# Patient Record
Sex: Female | Born: 1940 | Race: Black or African American | Hispanic: No | Smoking: Former smoker
Health system: Southern US, Community
[De-identification: ages and names within clinical notes are randomized; demographics above are authoritative.]

## PROBLEM LIST (undated history)

## (undated) DIAGNOSIS — I639 Cerebral infarction, unspecified: Secondary | ICD-10-CM

## (undated) DIAGNOSIS — M109 Gout, unspecified: Secondary | ICD-10-CM

## (undated) DIAGNOSIS — I10 Essential (primary) hypertension: Secondary | ICD-10-CM

## (undated) DIAGNOSIS — R011 Cardiac murmur, unspecified: Secondary | ICD-10-CM

## (undated) DIAGNOSIS — M1990 Unspecified osteoarthritis, unspecified site: Secondary | ICD-10-CM

## (undated) DIAGNOSIS — Z95810 Presence of automatic (implantable) cardiac defibrillator: Secondary | ICD-10-CM

## (undated) DIAGNOSIS — C50912 Malignant neoplasm of unspecified site of left female breast: Secondary | ICD-10-CM

## (undated) DIAGNOSIS — F32A Depression, unspecified: Secondary | ICD-10-CM

## (undated) DIAGNOSIS — Z8719 Personal history of other diseases of the digestive system: Secondary | ICD-10-CM

## (undated) DIAGNOSIS — E669 Obesity, unspecified: Secondary | ICD-10-CM

## (undated) DIAGNOSIS — I77819 Aortic ectasia, unspecified site: Secondary | ICD-10-CM

## (undated) DIAGNOSIS — I509 Heart failure, unspecified: Secondary | ICD-10-CM

## (undated) DIAGNOSIS — Z8711 Personal history of peptic ulcer disease: Secondary | ICD-10-CM

## (undated) DIAGNOSIS — N183 Chronic kidney disease, stage 3 unspecified: Secondary | ICD-10-CM

## (undated) DIAGNOSIS — G4733 Obstructive sleep apnea (adult) (pediatric): Secondary | ICD-10-CM

## (undated) DIAGNOSIS — F329 Major depressive disorder, single episode, unspecified: Secondary | ICD-10-CM

## (undated) DIAGNOSIS — I429 Cardiomyopathy, unspecified: Secondary | ICD-10-CM

## (undated) DIAGNOSIS — Z9989 Dependence on other enabling machines and devices: Secondary | ICD-10-CM

## (undated) DIAGNOSIS — K219 Gastro-esophageal reflux disease without esophagitis: Secondary | ICD-10-CM

## (undated) HISTORY — DX: Heart failure, unspecified: I50.9

## (undated) HISTORY — PX: CATARACT EXTRACTION, BILATERAL: SHX1313

## (undated) HISTORY — PX: ABDOMINAL HYSTERECTOMY: SHX81

## (undated) HISTORY — PX: DILATION AND CURETTAGE OF UTERUS: SHX78

## (undated) HISTORY — DX: Cerebral infarction, unspecified: I63.9

## (undated) HISTORY — DX: Obesity, unspecified: E66.9

---

## 2006-02-08 HISTORY — PX: BREAST BIOPSY: SHX20

## 2007-02-09 DIAGNOSIS — C50912 Malignant neoplasm of unspecified site of left female breast: Secondary | ICD-10-CM

## 2007-02-09 HISTORY — PX: MASTECTOMY: SHX3

## 2007-02-09 HISTORY — DX: Malignant neoplasm of unspecified site of left female breast: C50.912

## 2013-11-20 ENCOUNTER — Ambulatory Visit
Admission: RE | Admit: 2013-11-20 | Discharge: 2013-11-20 | Disposition: A | Discharge status: Home / Self Care | Payer: Commercial Managed Care - HMO | Source: Ambulatory Visit | Attending: Family Medicine | Admitting: Family Medicine

## 2013-11-20 ENCOUNTER — Other Ambulatory Visit: Payer: Self-pay | Admitting: Family Medicine

## 2013-11-20 DIAGNOSIS — M79675 Pain in left toe(s): Secondary | ICD-10-CM

## 2014-05-01 ENCOUNTER — Encounter (HOSPITAL_COMMUNITY): Payer: Self-pay | Admitting: Emergency Medicine

## 2014-05-01 ENCOUNTER — Emergency Department (HOSPITAL_COMMUNITY): Payer: Commercial Managed Care - HMO

## 2014-05-01 ENCOUNTER — Other Ambulatory Visit (HOSPITAL_COMMUNITY): Payer: Self-pay

## 2014-05-01 ENCOUNTER — Inpatient Hospital Stay (HOSPITAL_COMMUNITY)
Admission: EM | Admit: 2014-05-01 | Discharge: 2014-05-08 | DRG: 287 | Disposition: A | Discharge status: Home / Self Care | Payer: Commercial Managed Care - HMO | Attending: Internal Medicine | Admitting: Internal Medicine

## 2014-05-01 DIAGNOSIS — I161 Hypertensive emergency: Secondary | ICD-10-CM

## 2014-05-01 DIAGNOSIS — N179 Acute kidney failure, unspecified: Secondary | ICD-10-CM | POA: Diagnosis present

## 2014-05-01 DIAGNOSIS — N183 Chronic kidney disease, stage 3 unspecified: Secondary | ICD-10-CM | POA: Diagnosis present

## 2014-05-01 DIAGNOSIS — I2584 Coronary atherosclerosis due to calcified coronary lesion: Secondary | ICD-10-CM | POA: Diagnosis present

## 2014-05-01 DIAGNOSIS — Z91013 Allergy to seafood: Secondary | ICD-10-CM | POA: Diagnosis not present

## 2014-05-01 DIAGNOSIS — Z87891 Personal history of nicotine dependence: Secondary | ICD-10-CM | POA: Diagnosis not present

## 2014-05-01 DIAGNOSIS — I5043 Acute on chronic combined systolic (congestive) and diastolic (congestive) heart failure: Secondary | ICD-10-CM | POA: Diagnosis present

## 2014-05-01 DIAGNOSIS — K5900 Constipation, unspecified: Secondary | ICD-10-CM | POA: Diagnosis not present

## 2014-05-01 DIAGNOSIS — I160 Hypertensive urgency: Secondary | ICD-10-CM | POA: Insufficient documentation

## 2014-05-01 DIAGNOSIS — J90 Pleural effusion, not elsewhere classified: Secondary | ICD-10-CM | POA: Diagnosis present

## 2014-05-01 DIAGNOSIS — I129 Hypertensive chronic kidney disease with stage 1 through stage 4 chronic kidney disease, or unspecified chronic kidney disease: Secondary | ICD-10-CM | POA: Diagnosis present

## 2014-05-01 DIAGNOSIS — I429 Cardiomyopathy, unspecified: Secondary | ICD-10-CM | POA: Diagnosis present

## 2014-05-01 DIAGNOSIS — I2510 Atherosclerotic heart disease of native coronary artery without angina pectoris: Secondary | ICD-10-CM | POA: Diagnosis present

## 2014-05-01 DIAGNOSIS — Z853 Personal history of malignant neoplasm of breast: Secondary | ICD-10-CM | POA: Diagnosis not present

## 2014-05-01 DIAGNOSIS — J9811 Atelectasis: Secondary | ICD-10-CM | POA: Diagnosis present

## 2014-05-01 DIAGNOSIS — Z7982 Long term (current) use of aspirin: Secondary | ICD-10-CM | POA: Diagnosis not present

## 2014-05-01 DIAGNOSIS — Z8249 Family history of ischemic heart disease and other diseases of the circulatory system: Secondary | ICD-10-CM | POA: Diagnosis not present

## 2014-05-01 DIAGNOSIS — M109 Gout, unspecified: Secondary | ICD-10-CM | POA: Diagnosis present

## 2014-05-01 DIAGNOSIS — Z9012 Acquired absence of left breast and nipple: Secondary | ICD-10-CM | POA: Diagnosis present

## 2014-05-01 DIAGNOSIS — I447 Left bundle-branch block, unspecified: Secondary | ICD-10-CM | POA: Diagnosis present

## 2014-05-01 DIAGNOSIS — J810 Acute pulmonary edema: Secondary | ICD-10-CM | POA: Diagnosis not present

## 2014-05-01 DIAGNOSIS — K219 Gastro-esophageal reflux disease without esophagitis: Secondary | ICD-10-CM | POA: Diagnosis present

## 2014-05-01 DIAGNOSIS — I5021 Acute systolic (congestive) heart failure: Secondary | ICD-10-CM | POA: Diagnosis not present

## 2014-05-01 DIAGNOSIS — N182 Chronic kidney disease, stage 2 (mild): Secondary | ICD-10-CM | POA: Diagnosis not present

## 2014-05-01 DIAGNOSIS — I7789 Other specified disorders of arteries and arterioles: Secondary | ICD-10-CM | POA: Diagnosis not present

## 2014-05-01 DIAGNOSIS — Z9221 Personal history of antineoplastic chemotherapy: Secondary | ICD-10-CM

## 2014-05-01 DIAGNOSIS — Z791 Long term (current) use of non-steroidal anti-inflammatories (NSAID): Secondary | ICD-10-CM

## 2014-05-01 DIAGNOSIS — I509 Heart failure, unspecified: Secondary | ICD-10-CM | POA: Insufficient documentation

## 2014-05-01 DIAGNOSIS — Z8673 Personal history of transient ischemic attack (TIA), and cerebral infarction without residual deficits: Secondary | ICD-10-CM

## 2014-05-01 DIAGNOSIS — I10 Essential (primary) hypertension: Secondary | ICD-10-CM | POA: Diagnosis not present

## 2014-05-01 DIAGNOSIS — I5042 Chronic combined systolic (congestive) and diastolic (congestive) heart failure: Secondary | ICD-10-CM | POA: Diagnosis present

## 2014-05-01 HISTORY — DX: Gout, unspecified: M10.9

## 2014-05-01 HISTORY — DX: Essential (primary) hypertension: I10

## 2014-05-01 HISTORY — DX: Chronic kidney disease, stage 3 (moderate): N18.3

## 2014-05-01 HISTORY — DX: Chronic kidney disease, stage 3 unspecified: N18.30

## 2014-05-01 HISTORY — DX: Aortic ectasia, unspecified site: I77.819

## 2014-05-01 HISTORY — DX: Cardiomyopathy, unspecified: I42.9

## 2014-05-01 LAB — CBC
HEMATOCRIT: 41.7 % (ref 36.0–46.0)
HEMATOCRIT: 44.5 % (ref 36.0–46.0)
HEMOGLOBIN: 15.1 g/dL — AB (ref 12.0–15.0)
Hemoglobin: 14 g/dL (ref 12.0–15.0)
MCH: 28.1 pg (ref 26.0–34.0)
MCH: 28.1 pg (ref 26.0–34.0)
MCHC: 33.6 g/dL (ref 30.0–36.0)
MCHC: 33.9 g/dL (ref 30.0–36.0)
MCV: 83.7 fL (ref 78.0–100.0)
MCV: 82.7 fL (ref 78.0–100.0)
PLATELETS: 201 10*3/uL (ref 150–400)
Platelets: 195 10*3/uL (ref 150–400)
RBC: 4.98 MIL/uL (ref 3.87–5.11)
RBC: 5.38 MIL/uL — AB (ref 3.87–5.11)
RDW: 15.3 % (ref 11.5–15.5)
RDW: 15.2 % (ref 11.5–15.5)
WBC: 4.5 10*3/uL (ref 4.0–10.5)
WBC: 5 10*3/uL (ref 4.0–10.5)

## 2014-05-01 LAB — BASIC METABOLIC PANEL
ANION GAP: 7 (ref 5–15)
BUN: 22 mg/dL (ref 6–23)
CHLORIDE: 108 mmol/L (ref 96–112)
CO2: 27 mmol/L (ref 19–32)
Calcium: 9.7 mg/dL (ref 8.4–10.5)
Creatinine, Ser: 1.34 mg/dL — ABNORMAL HIGH (ref 0.50–1.10)
GFR calc non Af Amer: 38 mL/min — ABNORMAL LOW (ref >90)
GFR, EST AFRICAN AMERICAN: 44 mL/min — AB (ref >90)
Glucose, Bld: 97 mg/dL (ref 70–99)
POTASSIUM: 4 mmol/L (ref 3.5–5.1)
Sodium: 142 mmol/L (ref 135–145)

## 2014-05-01 LAB — I-STAT TROPONIN, ED: TROPONIN I, POC: 0.03 ng/mL (ref 0.00–0.08)

## 2014-05-01 LAB — CREATININE, SERUM
CREATININE: 1.26 mg/dL — AB (ref 0.50–1.10)
GFR, EST AFRICAN AMERICAN: 47 mL/min — AB (ref >90)
GFR, EST NON AFRICAN AMERICAN: 41 mL/min — AB (ref >90)

## 2014-05-01 LAB — TROPONIN I: TROPONIN I: 0.03 ng/mL (ref <0.031)

## 2014-05-01 LAB — BRAIN NATRIURETIC PEPTIDE: B Natriuretic Peptide: 433.8 pg/mL — ABNORMAL HIGH (ref 0.0–100.0)

## 2014-05-01 LAB — D-DIMER, QUANTITATIVE: D-Dimer, Quant: 0.89 ug/mL-FEU — ABNORMAL HIGH (ref 0.00–0.48)

## 2014-05-01 MED ORDER — IOHEXOL 350 MG/ML SOLN
100.0000 mL | Freq: Once | INTRAVENOUS | Status: AC | PRN
  Administered 2014-05-01: 100 mL via INTRAVENOUS

## 2014-05-01 MED ORDER — ASPIRIN 81 MG PO CHEW: 324.0000 mg | CHEWABLE_TABLET | ORAL | Status: DC

## 2014-05-01 MED ORDER — ASPIRIN 325 MG PO TABS
325.0000 mg | ORAL_TABLET | Freq: Once | ORAL | Status: AC
  Administered 2014-05-01: 325 mg via ORAL
  Filled 2014-05-01: qty 1

## 2014-05-01 MED ORDER — ALLOPURINOL 100 MG PO TABS
100.0000 mg | ORAL_TABLET | Freq: Every day | ORAL | Status: DC
  Administered 2014-05-02 – 2014-05-08 (×8): 100 mg via ORAL
  Filled 2014-05-01 (×8): qty 1

## 2014-05-01 MED ORDER — HYDRALAZINE HCL 20 MG/ML IJ SOLN: 10.0000 mg | INTRAMUSCULAR | Status: DC | PRN

## 2014-05-01 MED ORDER — HEPARIN SODIUM (PORCINE) 5000 UNIT/ML IJ SOLN
5000.0000 [IU] | Freq: Three times a day (TID) | INTRAMUSCULAR | Status: DC
  Administered 2014-05-01 – 2014-05-06 (×15): 5000 [IU] via SUBCUTANEOUS
  Filled 2014-05-01 (×21): qty 1

## 2014-05-01 MED ORDER — HYDRALAZINE HCL 20 MG/ML IJ SOLN
10.0000 mg | Freq: Once | INTRAMUSCULAR | Status: AC
  Administered 2014-05-01: 10 mg via INTRAVENOUS
  Filled 2014-05-01: qty 1

## 2014-05-01 MED ORDER — NITROGLYCERIN IN D5W 200-5 MCG/ML-% IV SOLN
10.0000 ug/min | INTRAVENOUS | Status: DC
  Administered 2014-05-01: 10 ug/min via INTRAVENOUS
  Filled 2014-05-01: qty 250

## 2014-05-01 MED ORDER — NITROGLYCERIN 2 % TD OINT: 1.0000 [in_us] | TOPICAL_OINTMENT | Freq: Four times a day (QID) | TRANSDERMAL | Status: DC | PRN

## 2014-05-01 MED ORDER — NITROGLYCERIN 2 % TD OINT
1.0000 [in_us] | TOPICAL_OINTMENT | Freq: Once | TRANSDERMAL | Status: AC
  Administered 2014-05-01: 1 [in_us] via TOPICAL
  Filled 2014-05-01: qty 1

## 2014-05-01 MED ORDER — ASPIRIN 300 MG RE SUPP: 300.0000 mg | RECTAL | Status: DC

## 2014-05-01 MED ORDER — FUROSEMIDE 10 MG/ML IJ SOLN
40.0000 mg | Freq: Once | INTRAMUSCULAR | Status: AC
  Administered 2014-05-01: 40 mg via INTRAVENOUS
  Filled 2014-05-01: qty 4

## 2014-05-01 MED ORDER — PANTOPRAZOLE SODIUM 40 MG PO TBEC
40.0000 mg | DELAYED_RELEASE_TABLET | Freq: Every day | ORAL | Status: DC
  Administered 2014-05-01 – 2014-05-08 (×8): 40 mg via ORAL
  Filled 2014-05-01 (×5): qty 1

## 2014-05-01 MED ORDER — SODIUM CHLORIDE 0.9 % IV SOLN: 250.0000 mL | INTRAVENOUS | Status: DC | PRN

## 2014-05-01 MED ORDER — NICARDIPINE HCL IN NACL 20-0.86 MG/200ML-% IV SOLN
3.0000 mg/h | INTRAVENOUS | Status: DC
  Administered 2014-05-01: 5 mg/h via INTRAVENOUS
  Administered 2014-05-02: 2.5 mg/h via INTRAVENOUS
  Filled 2014-05-01 (×3): qty 200

## 2014-05-01 NOTE — ED Notes (Addendum)
Dr. Johnney Killian notified of pt. bp decrease to 191/119. Will increase Nitro drip to 10 mcg when pt. Returns from CTA. Pt. Alert and oriented without CP. Dr. Johnney Killian gave verbal order that it is ok to d/c nitro when pt. Is in CT.

## 2014-05-01 NOTE — ED Provider Notes (Signed)
CSN: [id number]     Arrival date & time 05/01/14  1358 History   First MD Initiated Contact with Patient 05/01/14 1521     Chief Complaint  Patient presents with  . Shortness of Breath     (Consider location/radiation/quality/duration/timing/severity/associated sxs/prior Treatment) HPI The patient reports shortness of breath intermittently for the past 2 months. It has gotten significantly worse in the past 2 days. She reports yesterday she was very short of breath especially in the morning when she woke up. She got some improvement over the course of the day but by today she again was feeling very short of breath. She does identify some swelling in her legs. She reports that it's been both legs and she denies any calf pain. She denies any chest pain, fever, cough or sputum production. There has been no vomiting or diarrhea. She reports sometimes she has difficulty making it to the bathroom before she has to go, however she denies decreased or lack of urine output. She denies similar symptoms until the rash will onset about 2 months ago. She has a distant smoking history. The patient quit 7 years ago and prior to that smoked approximately one pack every 3 days. The patient reports she did not take her blood pressure medicines today because she wasn't feeling well. She reports that she has been compliant with the medications up until yesterday. The patient was seen by her primary care doctor today. Dr. Ernie Hew on [address] She was referred to the emergency department for further evaluation and treatment. Past Medical History  Diagnosis Date  . Hypertension   . Cancer     breast cancer   Past Surgical History  Procedure Laterality Date  . Mastectomy    . Abdominal hysterectomy     No family history on file. History  Substance Use Topics  . Smoking status: Former Research scientist (life sciences)  . Smokeless tobacco: Not on file  . Alcohol Use: No   OB History    No data available     Review of Systems  10  Systems reviewed and are negative for acute change except as noted in the HPI.  Allergies  Shellfish allergy  Home Medications   Prior to Admission medications   Medication Sig Start Date End Date Taking? Authorizing Provider  allopurinol (ZYLOPRIM) 100 MG tablet Take 100 mg by mouth daily. 03/28/14  Yes Historical Provider, MD  BYSTOLIC 2.5 MG tablet Take 2.5 mg by mouth daily.  03/29/14  Yes Historical Provider, MD  ibuprofen (ADVIL,MOTRIN) 200 MG tablet Take 400 mg by mouth every 6 (six) hours as needed for moderate pain.   Yes Historical Provider, MD  omeprazole (PRILOSEC) 40 MG capsule Take 40 mg by mouth daily. 03/28/14  Yes Historical Provider, MD   BP 192/98 mmHg  Pulse 61  Temp(Src) 97.9 F (36.6 C) (Oral)  Resp 15  SpO2 98% Physical Exam  Constitutional: She is oriented to person, place, and time.  Patient is moderately obese. She has mildly dyspneic at rest. The patient is nontoxic and alert. Her mental status is clear. Skin is warm and dry.  HENT:  Head: Normocephalic and atraumatic.  Eyes: EOM are normal. Pupils are equal, round, and reactive to light.  Neck: Neck supple.  Cardiovascular: Normal rate, regular rhythm, normal heart sounds and intact distal pulses.   Pulmonary/Chest: Effort normal and breath sounds normal.  Mild increased work of breathing at rest.  Abdominal: Soft. Bowel sounds are normal. She exhibits no distension. There is no  tenderness.  Musculoskeletal: Normal range of motion. She exhibits edema.  Patient has symmetric 1+ pitting edema bilateral lower extremities. No calf or popliteal fossa tenderness.  Neurological: She is alert and oriented to person, place, and time. She has normal strength. Coordination normal. GCS eye subscore is 4. GCS verbal subscore is 5. GCS motor subscore is 6.  Skin: Skin is warm, dry and intact.  Psychiatric: She has a normal mood and affect.    ED Course  Procedures (including critical care time) Labs Review Labs  Reviewed  BASIC METABOLIC PANEL - Abnormal; Notable for the following:    Creatinine, Ser 1.34 (*)    GFR calc non Af Amer 38 (*)    GFR calc Af Amer 44 (*)    All other components within normal limits  BRAIN NATRIURETIC PEPTIDE - Abnormal; Notable for the following:    B Natriuretic Peptide 433.8 (*)    All other components within normal limits  D-DIMER, QUANTITATIVE - Abnormal; Notable for the following:    D-Dimer, Quant 0.89 (*)    All other components within normal limits  CBC  I-STAT TROPOININ, ED    Imaging Review Dg Chest 2 View  05/01/2014   CLINICAL DATA:  Shortness of breath beginning yesterday, former smoker, LEFT breast cancer post mastectomy in 2009, hypertension  EXAM: CHEST  2 VIEW  COMPARISON:  None  FINDINGS: Enlargement of cardiac silhouette.  Mild elongation of thoracic aorta with atherosclerotic calcification.  Bronchitic changes with subsegmental atelectasis at RIGHT base.  No definite acute infiltrate, pleural effusion or pneumothorax.  Bones demineralized.  IMPRESSION: Enlargement of cardiac silhouette.  Bronchitic changes with subsegmental atelectasis RIGHT base.   Electronically Signed   By: Lavonia Dana M.D.   On: 05/01/2014 15:33   Ct Angio Chest Pe W/cm &/or Wo Cm  05/01/2014   CLINICAL DATA:  Short of breath.  Cough.  Fever.  Chills.  EXAM: CT ANGIOGRAPHY CHEST WITH CONTRAST  TECHNIQUE: Multidetector CT imaging of the chest was performed using the standard protocol during bolus administration of intravenous contrast. Multiplanar CT image reconstructions and MIPs were obtained to evaluate the vascular anatomy.  CONTRAST:  100 mL Omnipaque 300.  COMPARISON:  Chest radiograph 05/01/2014.  FINDINGS: Bones: No aggressive osseous lesions. Age expected thoracic and lower cervical spondylosis.  Cardiovascular: Technically adequate study. No pulmonary embolism. No gross acute aortic abnormality.  Distal aortic arch measures 3.9 cm. Recommend annual imaging followup by CTA or  MRA. This recommendation follows 2010 ACCF/AHA/AATS/ACR/ASA/SCA/SCAI/SIR/STS/SVM Guidelines for the Diagnosis and Management of Patients with Thoracic Aortic Disease. Circulation.2010; 121: P809-X833  Lungs: Dependent atelectasis is present. There is airspace disease in the dependent portions of both upper lobes and both lower lobes. This is superimposed on the atelectasis. Interlobular septal thickening is present at the apices. No focal airspace consolidation to suggest pneumonia or aspiration.  Central airways: Patent.  Effusions: Small pleural effusions bilaterally. No pericardial effusion.  Lymphadenopathy: LEFT mastectomy and axillary dissection with scarring in the LEFT axilla. No RIGHT axillary adenopathy. No mediastinal or hilar adenopathy. Calcified RIGHT hilar lymph nodes consistent with old granulomatous disease.  Esophagus: Normal.  Upper abdomen: Atherosclerosis.  Other: None.  Review of the MIP images confirms the above findings.  IMPRESSION: 1. Negative for pulmonary embolism. 2. Constellation of findings compatible with mild to moderate CHF. Small bilateral pleural effusions and mild alveolar pulmonary edema along with interstitial edema. 3. 39 mm distal aortic arch. Recommend annual imaging followup by CTA or MRA. This recommendation  follows 2010 ACCF/AHA/AATS/ACR/ASA/SCA/SCAI/SIR/STS/SVM Guidelines for the Diagnosis and Management of Patients with Thoracic Aortic Disease. Circulation.2010; 121: H686-H683   Electronically Signed   By: Dereck Ligas M.D.   On: 05/01/2014 20:00     EKG Interpretation   Date/Time:  Wednesday May 01 2014 14:04:50 EDT Ventricular Rate:  72 PR Interval:  192 QRS Duration: 140 QT Interval:  444 QTC Calculation: 486 R Axis:   -37 Text Interpretation:  Sinus rhythm with occasional Premature ventricular  complexes and Possible Premature atrial complexes with Abberant conduction  Left axis deviation Left ventricular hypertrophy with QRS widening and   repolarization abnormality Cannot rule out Septal infarct , age  undetermined Abnormal ECG AGREE. NEED REPEAT FOR BASELINE ARTIFACT.  Confirmed by Johnney Killian, MD, Jeannie Done [phone]) on 05/01/2014 3:42:28 PM     Recheck 18:00. Patient reports her shortness of breath feels improved. Blood pressures however has not improved with Lasix and nitroglycerin paste. Measured pressure is 198/113. I will initiate the patient on a nitroglycerin drip. She has not yet been to CT. Recheck 20:30 patient has returned from CT and has no complaints of chest pain. She is alert and appropriate. No respiratory distress. Consultation with Dr. Titus Mould (intensivist) he suggests administration of hydralazine 10 mg in continuation of nitroglycerin drip. The patient will be admitted to the ICU for drip maintenance. MDM   Final diagnoses:  Hypertensive emergency  Acute congestive heart failure, unspecified congestive heart failure type   The patient presents on above with dyspnea. Findings are consistent with congestive heart failure and hypertensive emergency. The patient's mental status is clear with no headache and no neurologic complaints. She does not endorse any chest pain in association with her dyspnea. She does however have significantly elevated blood pressure with congestive heart failure.    Charlesetta Shanks, MD 05/01/14 2115

## 2014-05-01 NOTE — H&P (Signed)
PULMONARY / CRITICAL CARE MEDICINE   Name: Tara Dunn MRN: [id number] DOB: Jul 20, 1940    ADMISSION DATE:  05/01/2014 CONSULTATION DATE:  05/01/2014  REFERRING MD :  EDP  CHIEF COMPLAINT:  SOB  INITIAL PRESENTATION:  74 y.o. F brought to [facility] ED 3/23 with SOB due to hypertensive urgency.  Started on nitro gtt and PCCM called for ICU admission.     STUDIES:  CXR 3/23 >>>  Enlargement of cardiac silhouette, atx right base. CTA Chest 3/23 >>> neg for PE.  Mild to mod CHF.  Small b/l pleural effusions and mild pulm and interstitial edema.  SIGNIFICANT EVENTS: 3/23 - admit   HISTORY OF PRESENT ILLNESS:  Tara Dunn is a 74 y.o. F with PMH of HTN and breast CA who presented to [facility] ED 3/23 with SOB.  She states her symptoms have been present for past 2 months, but have worsened over the past 2 - 3 days.  1 day ago, symptoms were at their worst; however, she did not feel like seeking medical attention despite her children urging her to do so.  On 3/23, symptoms persisted so she agreed to go to PCP where she was found to be hypertensive.  PCP sent her to ED for further evaluation.  In ED, BP noted to be 205 / 145.   She takes antihypertensives at home and states that she did not take them on 3/22 but otherwise has not missed any doses.  Denies any headaches, visual disturbances, chest pain, N/V/D, abd pain, myalgias, calf pain.  No recent periods of prolonged immobilization or travel.  No prior hx of clots.  Does have hx of breast CA. She was started on nitroglycerin infusion and was also given lasix and hydralazine.  PCCM was consulted for admission to ICU due to need for antihypertensive infusions.  PAST MEDICAL HISTORY :   has a past medical history of Hypertension and Cancer.  has past surgical history that includes Mastectomy and Abdominal hysterectomy. Prior to Admission medications   Medication Sig Start Date End Date Taking? Authorizing Provider  allopurinol (ZYLOPRIM) 100 MG  tablet Take 100 mg by mouth daily. 03/28/14  Yes Historical Provider, MD  BYSTOLIC 2.5 MG tablet Take 2.5 mg by mouth daily.  03/29/14  Yes Historical Provider, MD  ibuprofen (ADVIL,MOTRIN) 200 MG tablet Take 400 mg by mouth every 6 (six) hours as needed for moderate pain.   Yes Historical Provider, MD  omeprazole (PRILOSEC) 40 MG capsule Take 40 mg by mouth daily. 03/28/14  Yes Historical Provider, MD   Allergies  Allergen Reactions  . Shellfish Allergy Hives    FAMILY HISTORY:  No family history on file.  SOCIAL HISTORY:  reports that she has quit smoking. She does not have any smokeless tobacco history on file. She reports that she does not drink alcohol or use illicit drugs.  REVIEW OF SYSTEMS:   All negative; except for those that are bolded, which indicate positives.  Constitutional: weight loss, weight gain, night sweats, fevers, chills, fatigue, weakness.  HEENT: headaches, sore throat, sneezing, nasal congestion, post nasal drip, difficulty swallowing, tooth/dental problems, visual complaints, visual changes, ear aches. Neuro: difficulty with speech, weakness, numbness, ataxia. CV:  chest pain, orthopnea, PND, swelling in lower extremities, dizziness, palpitations, syncope.  Resp: cough, hemoptysis, dyspnea, wheezing. GI  heartburn, indigestion, abdominal pain, nausea, vomiting, diarrhea, constipation, change in bowel habits, loss of appetite, hematemesis, melena, hematochezia.  GU: dysuria, change in color of urine, urgency or frequency, flank pain, hematuria.  MSK: joint pain or swelling, decreased range of motion. Psych: change in mood or affect, depression, anxiety, suicidal ideations, homicidal ideations. Skin: rash, itching, bruising.   SUBJECTIVE:  Dyspnea improved since arriving to ED.  Denies any chest pain, blurry vision, headaches.  VITAL SIGNS: Temp:  [97.9 F (36.6 C)] 97.9 F (36.6 C) (03/23 1415) Pulse Rate:  [54-76] 56 (03/23 2115) Resp:  [14-24] 18 (03/23  2115) BP: (165-205)/(97-145) 189/117 mmHg (03/23 2115) SpO2:  [91 %-99 %] 98 % (03/23 2115) HEMODYNAMICS:   VENTILATOR SETTINGS:   INTAKE / OUTPUT: Intake/Output      03/23 0701 - 03/24 0700   Urine 500   Total Output 500   Net -500         PHYSICAL EXAMINATION: General: WDWN female, in NAD. Neuro: A&O x 3, non-focal.  HEENT: [place]/AT. PERRL, sclerae anicteric. Cardiovascular: RRR, no M/R/G.  Lungs: Respirations even and unlabored.  Faint crackles, diminished in bases. Abdomen: BS x 4, soft, NT/ND.  Musculoskeletal: No gross deformities, no edema.  Skin: Intact, warm, no rashes.  LABS:  CBC  Recent Labs Lab 05/01/14 1424  WBC 4.5  HGB 14.0  HCT 41.7  PLT 201   Coag's No results for input(s): APTT, INR in the last 168 hours. BMET  Recent Labs Lab 05/01/14 1424  NA 142  K 4.0  CL 108  CO2 27  BUN 22  CREATININE 1.34*  GLUCOSE 97   Electrolytes  Recent Labs Lab 05/01/14 1424  CALCIUM 9.7   Sepsis Markers No results for input(s): LATICACIDVEN, PROCALCITON, O2SATVEN in the last 168 hours. ABG No results for input(s): PHART, PCO2ART, PO2ART in the last 168 hours. Liver Enzymes No results for input(s): AST, ALT, ALKPHOS, BILITOT, ALBUMIN in the last 168 hours. Cardiac Enzymes No results for input(s): TROPONINI, PROBNP in the last 168 hours. Glucose No results for input(s): GLUCAP in the last 168 hours.  Imaging Dg Chest 2 View  05/01/2014   CLINICAL DATA:  Shortness of breath beginning yesterday, former smoker, LEFT breast cancer post mastectomy in 2009, hypertension  EXAM: CHEST  2 VIEW  COMPARISON:  None  FINDINGS: Enlargement of cardiac silhouette.  Mild elongation of thoracic aorta with atherosclerotic calcification.  Bronchitic changes with subsegmental atelectasis at RIGHT base.  No definite acute infiltrate, pleural effusion or pneumothorax.  Bones demineralized.  IMPRESSION: Enlargement of cardiac silhouette.  Bronchitic changes with subsegmental  atelectasis RIGHT base.   Electronically Signed   By: Lavonia Dana M.D.   On: 05/01/2014 15:33   Ct Angio Chest Pe W/cm &/or Wo Cm  05/01/2014   CLINICAL DATA:  Short of breath.  Cough.  Fever.  Chills.  EXAM: CT ANGIOGRAPHY CHEST WITH CONTRAST  TECHNIQUE: Multidetector CT imaging of the chest was performed using the standard protocol during bolus administration of intravenous contrast. Multiplanar CT image reconstructions and MIPs were obtained to evaluate the vascular anatomy.  CONTRAST:  100 mL Omnipaque 300.  COMPARISON:  Chest radiograph 05/01/2014.  FINDINGS: Bones: No aggressive osseous lesions. Age expected thoracic and lower cervical spondylosis.  Cardiovascular: Technically adequate study. No pulmonary embolism. No gross acute aortic abnormality.  Distal aortic arch measures 3.9 cm. Recommend annual imaging followup by CTA or MRA. This recommendation follows 2010 ACCF/AHA/AATS/ACR/ASA/SCA/SCAI/SIR/STS/SVM Guidelines for the Diagnosis and Management of Patients with Thoracic Aortic Disease. Circulation.2010; 121: W098-J191  Lungs: Dependent atelectasis is present. There is airspace disease in the dependent portions of both upper lobes and both lower lobes. This is superimposed on the  atelectasis. Interlobular septal thickening is present at the apices. No focal airspace consolidation to suggest pneumonia or aspiration.  Central airways: Patent.  Effusions: Small pleural effusions bilaterally. No pericardial effusion.  Lymphadenopathy: LEFT mastectomy and axillary dissection with scarring in the LEFT axilla. No RIGHT axillary adenopathy. No mediastinal or hilar adenopathy. Calcified RIGHT hilar lymph nodes consistent with old granulomatous disease.  Esophagus: Normal.  Upper abdomen: Atherosclerosis.  Other: None.  Review of the MIP images confirms the above findings.  IMPRESSION: 1. Negative for pulmonary embolism. 2. Constellation of findings compatible with mild to moderate CHF. Small bilateral pleural  effusions and mild alveolar pulmonary edema along with interstitial edema. 3. 39 mm distal aortic arch. Recommend annual imaging followup by CTA or MRA. This recommendation follows 2010 ACCF/AHA/AATS/ACR/ASA/SCA/SCAI/SIR/STS/SVM Guidelines for the Diagnosis and Management of Patients with Thoracic Aortic Disease. Circulation.2010; 121: Z610-R604   Electronically Signed   By: Dereck Ligas M.D.   On: 05/01/2014 20:00    ASSESSMENT / PLAN:  PULMONARY A: Dyspnea to to pulmonary edema in setting hypertensive urgency - improving Mild pulmonary edema Small bilateral pleural effusions Atelectasis P:   See cardiovascular section for antihypertensive regimen. Pulmonary hygiene CXR in AM.  CARDIOVASCULAR A:  Hypertensive urgency - initial BP 205 / 145 LBBB - no prior EGK's for comparison P:  Nicardipine gtt, goal SBP ~150 for 25% reduction in SBP. Hydralazine q4hrs PRN. Nitro paste q6hrs PRN. May need lasix. Trend troponins. EKG in AM. Echo. Hold outpatient bystolic.  RENAL A:   CKD III P:   BMP in AM.  GASTROINTESTINAL A:   GERD Nutrition P:   Continue outpatient PPI. Heart healthy diet.  HEMATOLOGIC A:   VTE Prophylaxis P:  SCD's / Heparin. CBC in AM.  INFECTIOUS A:   No indication for infection P:   Monitor clinically.  ENDOCRINE A:   No known issues P:   SSI if glucose consistently > 180.  NEUROLOGIC A:   No acute issues P:   No interventions required.   Family updated: None.  Interdisciplinary Family Meeting v Palliative Care Meeting:  Due by: 3/29.  CC time: 30 minutes.  Montey Hora, [facility] Pager: [phone]  or [phone] 05/01/2014, 9:37 PM

## 2014-05-01 NOTE — ED Notes (Signed)
Pt reports hx of intermittent sob but worse last night. Pt seen at her pcp today and sent here for further evaluation. Denies cough, fever, chills.

## 2014-05-02 ENCOUNTER — Inpatient Hospital Stay (HOSPITAL_COMMUNITY): Payer: Commercial Managed Care - HMO

## 2014-05-02 DIAGNOSIS — J810 Acute pulmonary edema: Secondary | ICD-10-CM

## 2014-05-02 DIAGNOSIS — N182 Chronic kidney disease, stage 2 (mild): Secondary | ICD-10-CM

## 2014-05-02 DIAGNOSIS — I7789 Other specified disorders of arteries and arterioles: Secondary | ICD-10-CM

## 2014-05-02 DIAGNOSIS — I10 Essential (primary) hypertension: Secondary | ICD-10-CM

## 2014-05-02 LAB — CBC
HCT: 42 % (ref 36.0–46.0)
Hemoglobin: 13.9 g/dL (ref 12.0–15.0)
MCH: 27.6 pg (ref 26.0–34.0)
MCHC: 33.1 g/dL (ref 30.0–36.0)
MCV: 83.5 fL (ref 78.0–100.0)
PLATELETS: 198 10*3/uL (ref 150–400)
RBC: 5.03 MIL/uL (ref 3.87–5.11)
RDW: 15.3 % (ref 11.5–15.5)
WBC: 5.4 10*3/uL (ref 4.0–10.5)

## 2014-05-02 LAB — TROPONIN I
TROPONIN I: 0.03 ng/mL (ref <0.031)
Troponin I: 0.03 ng/mL (ref <0.031)

## 2014-05-02 LAB — BASIC METABOLIC PANEL
Anion gap: 9 (ref 5–15)
BUN: 18 mg/dL (ref 6–23)
CALCIUM: 9.5 mg/dL (ref 8.4–10.5)
CO2: 25 mmol/L (ref 19–32)
CREATININE: 1.18 mg/dL — AB (ref 0.50–1.10)
Chloride: 105 mmol/L (ref 96–112)
GFR, EST AFRICAN AMERICAN: 51 mL/min — AB (ref >90)
GFR, EST NON AFRICAN AMERICAN: 44 mL/min — AB (ref >90)
GLUCOSE: 101 mg/dL — AB (ref 70–99)
Potassium: 3.2 mmol/L — ABNORMAL LOW (ref 3.5–5.1)
Sodium: 139 mmol/L (ref 135–145)

## 2014-05-02 LAB — PHOSPHORUS: PHOSPHORUS: 3.2 mg/dL (ref 2.3–4.6)

## 2014-05-02 LAB — MRSA PCR SCREENING: MRSA by PCR: NEGATIVE

## 2014-05-02 LAB — MAGNESIUM: MAGNESIUM: 1.6 mg/dL (ref 1.5–2.5)

## 2014-05-02 MED ORDER — ASPIRIN 81 MG PO CHEW
81.0000 mg | CHEWABLE_TABLET | Freq: Every day | ORAL | Status: DC
  Administered 2014-05-02 – 2014-05-03 (×2): 81 mg via ORAL
  Filled 2014-05-02 (×2): qty 1

## 2014-05-02 MED ORDER — POTASSIUM CHLORIDE CRYS ER 20 MEQ PO TBCR
40.0000 meq | EXTENDED_RELEASE_TABLET | Freq: Once | ORAL | Status: AC
  Administered 2014-05-02: 40 meq via ORAL
  Filled 2014-05-02: qty 2

## 2014-05-02 MED ORDER — HYDROCHLOROTHIAZIDE 25 MG PO TABS
25.0000 mg | ORAL_TABLET | Freq: Every day | ORAL | Status: DC
  Administered 2014-05-02 – 2014-05-03 (×2): 25 mg via ORAL
  Filled 2014-05-02 (×2): qty 1

## 2014-05-02 MED ORDER — AMLODIPINE BESYLATE 5 MG PO TABS
5.0000 mg | ORAL_TABLET | Freq: Every day | ORAL | Status: DC
  Administered 2014-05-02 – 2014-05-08 (×7): 5 mg via ORAL
  Filled 2014-05-02 (×7): qty 1

## 2014-05-02 MED ORDER — NEBIVOLOL HCL 2.5 MG PO TABS
2.5000 mg | ORAL_TABLET | Freq: Every day | ORAL | Status: DC
  Administered 2014-05-02: 2.5 mg via ORAL
  Filled 2014-05-02 (×2): qty 1

## 2014-05-02 MED ORDER — HYDRALAZINE HCL 20 MG/ML IJ SOLN
10.0000 mg | INTRAMUSCULAR | Status: DC | PRN
  Administered 2014-05-02: 10 mg via INTRAVENOUS
  Filled 2014-05-02: qty 1

## 2014-05-02 NOTE — Plan of Care (Signed)
Problem: Phase I Progression Outcomes Goal: Hemodynamically stable Outcome: Progressing Nicardipine gtt off at 1430. BP has been maintained below systolic of 606 and diastolic of 004 per physician order.

## 2014-05-02 NOTE — Progress Notes (Signed)
PULMONARY / CRITICAL CARE MEDICINE   Name: Tara Dunn MRN: [id number] DOB: 05/06/1940    ADMISSION DATE:  05/01/2014  REFERRING MD :  EDP  CHIEF COMPLAINT:  SOB  INITIAL PRESENTATION:   74 yo female former smoker presented with dizziness and dyspnea from HTN emergency with pulmonary edema.  STUDIES:  CTA Chest 3/23 >>> interstitial edema and small b/l effusions, 3.9 cm distal aortic arch  SIGNIFICANT EVENTS: 3/23 - admit  SUBJECTIVE:  Breathing better.  Denies headache, blurred vision, chest pain, or nausea.  VITAL SIGNS: Temp:  [97.7 F (36.5 C)-98.3 F (36.8 C)] 97.7 F (36.5 C) (03/24 0737) Pulse Rate:  [54-79] 74 (03/24 0800) Resp:  [11-27] 18 (03/24 0800) BP: (123-205)/(61-145) 147/71 mmHg (03/24 0800) SpO2:  [91 %-99 %] 97 % (03/24 0800) Weight:  [195 lb 12.3 oz (88.8 kg)-209 lb (94.802 kg)] 195 lb 12.3 oz (88.8 kg) (03/24 0600) INTAKE / OUTPUT: Intake/Output      03/23 0701 - 03/24 0700 03/24 0701 - 03/25 0700   I.V. (mL/kg) 408 (4.6) 45 (0.5)   Total Intake(mL/kg) 408 (4.6) 45 (0.5)   Urine (mL/kg/hr) 1475    Total Output 1475     Net -1067 +45        Urine Occurrence 1 x      PHYSICAL EXAMINATION: General: no distress, sitting in chair Neuro: normal strength HEENT: pupils equal, reactive Cardiovascular: regular, no murmur Lungs: no wheeze Abdomen: soft, non tender  Musculoskeletal: no  edema Skin: no rashes  LABS:  CBC  Recent Labs Lab 05/01/14 1424 05/01/14 2152 05/02/14 0420  WBC 4.5 5.0 5.4  HGB 14.0 15.1* 13.9  HCT 41.7 44.5 42.0  PLT 201 195 198   BMET  Recent Labs Lab 05/01/14 1424 05/01/14 2152 05/02/14 0420  NA 142  --  139  K 4.0  --  3.2*  CL 108  --  105  CO2 27  --  25  BUN 22  --  18  CREATININE 1.34* 1.26* 1.18*  GLUCOSE 97  --  101*   Electrolytes  Recent Labs Lab 05/01/14 1424 05/02/14 0420  CALCIUM 9.7 9.5  MG  --  1.6  PHOS  --  3.2   Cardiac Enzymes  Recent Labs Lab 05/01/14 2152  05/02/14 0420  TROPONINI 0.03 0.03   Imaging Dg Chest 2 View  05/01/2014   CLINICAL DATA:  Shortness of breath beginning yesterday, former smoker, LEFT breast cancer post mastectomy in 2009, hypertension  EXAM: CHEST  2 VIEW  COMPARISON:  None  FINDINGS: Enlargement of cardiac silhouette.  Mild elongation of thoracic aorta with atherosclerotic calcification.  Bronchitic changes with subsegmental atelectasis at RIGHT base.  No definite acute infiltrate, pleural effusion or pneumothorax.  Bones demineralized.  IMPRESSION: Enlargement of cardiac silhouette.  Bronchitic changes with subsegmental atelectasis RIGHT base.   Electronically Signed   By: Lavonia Dana M.D.   On: 05/01/2014 15:33   Ct Angio Chest Pe W/cm &/or Wo Cm  05/01/2014   CLINICAL DATA:  Short of breath.  Cough.  Fever.  Chills.  EXAM: CT ANGIOGRAPHY CHEST WITH CONTRAST  TECHNIQUE: Multidetector CT imaging of the chest was performed using the standard protocol during bolus administration of intravenous contrast. Multiplanar CT image reconstructions and MIPs were obtained to evaluate the vascular anatomy.  CONTRAST:  100 mL Omnipaque 300.  COMPARISON:  Chest radiograph 05/01/2014.  FINDINGS: Bones: No aggressive osseous lesions. Age expected thoracic and lower cervical spondylosis.  Cardiovascular: Technically adequate study.  No pulmonary embolism. No gross acute aortic abnormality.  Distal aortic arch measures 3.9 cm. Recommend annual imaging followup by CTA or MRA. This recommendation follows 2010 ACCF/AHA/AATS/ACR/ASA/SCA/SCAI/SIR/STS/SVM Guidelines for the Diagnosis and Management of Patients with Thoracic Aortic Disease. Circulation.2010; 121: T244-Q286  Lungs: Dependent atelectasis is present. There is airspace disease in the dependent portions of both upper lobes and both lower lobes. This is superimposed on the atelectasis. Interlobular septal thickening is present at the apices. No focal airspace consolidation to suggest pneumonia or  aspiration.  Central airways: Patent.  Effusions: Small pleural effusions bilaterally. No pericardial effusion.  Lymphadenopathy: LEFT mastectomy and axillary dissection with scarring in the LEFT axilla. No RIGHT axillary adenopathy. No mediastinal or hilar adenopathy. Calcified RIGHT hilar lymph nodes consistent with old granulomatous disease.  Esophagus: Normal.  Upper abdomen: Atherosclerosis.  Other: None.  Review of the MIP images confirms the above findings.  IMPRESSION: 1. Negative for pulmonary embolism. 2. Constellation of findings compatible with mild to moderate CHF. Small bilateral pleural effusions and mild alveolar pulmonary edema along with interstitial edema. 3. 39 mm distal aortic arch. Recommend annual imaging followup by CTA or MRA. This recommendation follows 2010 ACCF/AHA/AATS/ACR/ASA/SCA/SCAI/SIR/STS/SVM Guidelines for the Diagnosis and Management of Patients with Thoracic Aortic Disease. Circulation.2010; 121: N817-R116   Electronically Signed   By: Dereck Ligas M.D.   On: 05/01/2014 20:00   Dg Chest Port 1 View  05/02/2014   CLINICAL DATA:  Edema.  Shortness of breath.  Cough.  EXAM: PORTABLE CHEST - 1 VIEW  COMPARISON:  CT 03/ 23/2016.  Chest x-ray 05/01/2014 .  FINDINGS: Stable thoracic aortic ectasia. Reference made to prior CT report. Cardiomegaly with mild pulmonary interstitial prominence again noted. These findings are again consistent with congestive heart failure. Tiny pleural effusions cannot be excluded. No pneumothorax. No acute osseus abnormality.  IMPRESSION: 1. Thoracic aortic ectasia again noted. Reference is made to prior CT report of 05/01/2014. Aortic contour is stable. 2. Mild congestive heart failure with mild interstitial edema. No significant interim change. Tiny bilateral pleural effusions cannot be excluded.   Electronically Signed   By: Marcello Moores  Register   On: 05/02/2014 07:30    ASSESSMENT / PLAN:  HTN emergency with acute on chronic diastolic heart  failure and acute pulmonary edema >> she reports her normal blood pressure is 579 to 038 systolic. Plan: - restart bystolic 3/33 - add norvasc, and HCTZ 3/24 - wean off nicardipine to keep SBP < 170 and DBP < 100 - will need outpt assessment for sleep apnea  Distal thoracic aorta arch enlargement. Plan: - f/u Echo - will need repeat CT chest in March 2017  CKD stage II. Plan: - monitor renal fx, urine outpt, electrolytes  Hx of Gout. Plan: - allopurinol  Hx of CVA. Plan: - ASA 81 mg daily  She can transfer out of ICU once she is off nicardipine gtt.  Updated pt family at bedside.  Chesley Mires, MD [facility] Pulmonary/Critical Care 05/02/2014, 9:19 AM Pager:  [phone] After 3pm call: [phone]

## 2014-05-02 NOTE — Clinical Social Work Note (Addendum)
CSW attempted to see patient, but patient was sleeping and not able to be aroused, will try to complete assessment tomorrow.  Jones Broom. Chandler, MSW, [place] 05/02/2014 4:14 PM

## 2014-05-02 NOTE — Progress Notes (Signed)
eLink Physician-Brief Progress Note Patient Name: Tara Dunn DOB: [birth year] MRN: [id number]   Date of Service  05/02/2014  HPI/Events of Note    eICU Interventions  Hypokalemia -repleted      Intervention Category Minor Interventions: Electrolytes abnormality - evaluation and management  ALVA,RAKESH V. 05/02/2014, 6:47 AM

## 2014-05-02 NOTE — Plan of Care (Signed)
Problem: Phase I Progression Outcomes Goal: OOB as tolerated unless otherwise ordered Outcome: Progressing Patient out of bed to [facility] Goal: Voiding-avoid urinary catheter unless indicated Outcome: Progressing No foley catheter has been indicated Goal: Hemodynamically stable Outcome: Progressing Blood pressure in acceptable range on cardene

## 2014-05-02 NOTE — Progress Notes (Signed)
*  PRELIMINARY RESULTS* Echocardiogram 2D Echocardiogram has been performed.  Tara Dunn 05/02/2014, 4:17 PM

## 2014-05-02 NOTE — Progress Notes (Signed)
eLink Physician-Brief Progress Note Patient Name: Tara Dunn DOB: 09-Jul-1940 MRN: [id number]   Date of Service  05/02/2014  HPI/Events of Note    eICU Interventions  Off nicardipine X hrs with controlled BP. eLink ordered transfer to tele 3/24 PM. TRH to assume care as of AM and PCCM sign off. Discussed with Dr Sherral Hammers     Intervention Category Intermediate Interventions: Other:  Merton Border 05/02/2014, 5:33 PM

## 2014-05-02 NOTE — Care Management Note (Addendum)
    Page 1 of 2   05/08/2014     4:56:37 PM CARE MANAGEMENT NOTE 05/08/2014  Patient:  Tara Dunn, Tara Dunn   Account Number:  [id number]  Date Initiated:  05/02/2014  Documentation initiated by:  Elissa Hefty  Subjective/Objective Assessment:   adm w htn urgency  CHF, AKI     Action/Plan:   lives at home alone   Anticipated DC Date:  05/08/2014   Anticipated DC Plan:  [place]  In-house referral  Clinical Social Worker      DC Forensic scientist  CM consult      [facility] Choice  HOME HEALTH   Choice offered to / List presented to:  C-1 Patient   DME arranged  [place]      DME agency  [place] arranged  HH-2 PT  HH-1 RN      [facility] agency  [facility]   Status of service:  Completed, signed off Medicare Important Message given?  YES (If response is "NO", the following Medicare IM given date fields will be blank) Date Medicare IM given:  05/03/2014 Medicare IM given by:  Janautica Netzley Date Additional Medicare IM given:  05/06/2014 Additional Medicare IM given by:  Ellan Lambert  Discharge Disposition:  [place]  Per UR Regulation:  Reviewed for med. necessity/level of care/duration of stay  If discussed at [place] of Stay Meetings, dates discussed:   05/07/2014    Comments:  05/07/14 Ellan Lambert, RN, BSN [phone] Pt concerned about the cost of [place] and RW.  WILL BE COVERED 100% FOR HHPT/OT-DME WILL DE 20% COINSURANCE- OUT-OF-POCKET IS $5K-  HAS NOT BEEN MET Called [employer], per insurance contract-copay est at $9.54.  Pt agreeable to my ordering RW for her.  Faxed referral to [facility] at [phone]. Referral to [facility] for [facility] follow up, per pt choice.  Start of care 24-48h post dc date.  DME company checking to see if they can deliver RW to hospital on Wed am prior to dc.  05/03/14 Ellan Lambert, RN, BSN [phone] Pt adm on 05/01/14 with SOB, COPD.  PTA, pt resides  at the [facility] independent apts on [address]. PT recommending HHPT and RW for home.  Will follow for [facility] orders/dc needs.

## 2014-05-03 ENCOUNTER — Encounter (HOSPITAL_COMMUNITY): Payer: Self-pay | Admitting: Nurse Practitioner

## 2014-05-03 DIAGNOSIS — I429 Cardiomyopathy, unspecified: Secondary | ICD-10-CM

## 2014-05-03 DIAGNOSIS — I161 Hypertensive emergency: Secondary | ICD-10-CM | POA: Diagnosis present

## 2014-05-03 DIAGNOSIS — I5043 Acute on chronic combined systolic (congestive) and diastolic (congestive) heart failure: Principal | ICD-10-CM

## 2014-05-03 DIAGNOSIS — N183 Chronic kidney disease, stage 3 unspecified: Secondary | ICD-10-CM | POA: Diagnosis present

## 2014-05-03 DIAGNOSIS — I5042 Chronic combined systolic (congestive) and diastolic (congestive) heart failure: Secondary | ICD-10-CM | POA: Diagnosis present

## 2014-05-03 DIAGNOSIS — I5021 Acute systolic (congestive) heart failure: Secondary | ICD-10-CM

## 2014-05-03 DIAGNOSIS — Z8673 Personal history of transient ischemic attack (TIA), and cerebral infarction without residual deficits: Secondary | ICD-10-CM

## 2014-05-03 LAB — CBC
HCT: 42.1 % (ref 36.0–46.0)
Hemoglobin: 13.8 g/dL (ref 12.0–15.0)
MCH: 27.3 pg (ref 26.0–34.0)
MCHC: 32.8 g/dL (ref 30.0–36.0)
MCV: 83.2 fL (ref 78.0–100.0)
PLATELETS: 209 10*3/uL (ref 150–400)
RBC: 5.06 MIL/uL (ref 3.87–5.11)
RDW: 15.6 % — AB (ref 11.5–15.5)
WBC: 4 10*3/uL (ref 4.0–10.5)

## 2014-05-03 LAB — BASIC METABOLIC PANEL
Anion gap: 10 (ref 5–15)
BUN: 19 mg/dL (ref 6–23)
CO2: 24 mmol/L (ref 19–32)
Calcium: 9.6 mg/dL (ref 8.4–10.5)
Chloride: 105 mmol/L (ref 96–112)
Creatinine, Ser: 1.31 mg/dL — ABNORMAL HIGH (ref 0.50–1.10)
GFR calc Af Amer: 45 mL/min — ABNORMAL LOW (ref >90)
GFR, EST NON AFRICAN AMERICAN: 39 mL/min — AB (ref >90)
GLUCOSE: 101 mg/dL — AB (ref 70–99)
POTASSIUM: 3.5 mmol/L (ref 3.5–5.1)
Sodium: 139 mmol/L (ref 135–145)

## 2014-05-03 MED ORDER — ATORVASTATIN CALCIUM 80 MG PO TABS
80.0000 mg | ORAL_TABLET | Freq: Every day | ORAL | Status: DC
  Administered 2014-05-03 – 2014-05-07 (×5): 80 mg via ORAL
  Filled 2014-05-03 (×6): qty 1

## 2014-05-03 MED ORDER — SENNOSIDES-DOCUSATE SODIUM 8.6-50 MG PO TABS
2.0000 | ORAL_TABLET | Freq: Once | ORAL | Status: AC
  Administered 2014-05-03: 2 via ORAL
  Filled 2014-05-03 (×5): qty 2

## 2014-05-03 MED ORDER — ASPIRIN EC 325 MG PO TBEC: 325.0000 mg | DELAYED_RELEASE_TABLET | Freq: Every day | ORAL | Status: DC

## 2014-05-03 MED ORDER — NEBIVOLOL HCL 5 MG PO TABS
5.0000 mg | ORAL_TABLET | Freq: Every day | ORAL | Status: DC
  Administered 2014-05-03 – 2014-05-08 (×6): 5 mg via ORAL
  Filled 2014-05-03 (×6): qty 1

## 2014-05-03 MED ORDER — ISOSORB DINITRATE-HYDRALAZINE 20-37.5 MG PO TABS
1.0000 | ORAL_TABLET | Freq: Three times a day (TID) | ORAL | Status: DC
Start: 2014-05-03 — End: 2014-05-04
  Administered 2014-05-03 – 2014-05-04 (×3): 1 via ORAL
  Filled 2014-05-03 (×5): qty 1

## 2014-05-03 MED ORDER — ASPIRIN EC 81 MG PO TBEC
81.0000 mg | DELAYED_RELEASE_TABLET | Freq: Every day | ORAL | Status: DC
  Administered 2014-05-04 – 2014-05-08 (×5): 81 mg via ORAL
  Filled 2014-05-03 (×5): qty 1

## 2014-05-03 MED ORDER — FUROSEMIDE 20 MG PO TABS
20.0000 mg | ORAL_TABLET | Freq: Every day | ORAL | Status: DC
  Administered 2014-05-03 – 2014-05-05 (×3): 20 mg via ORAL
  Filled 2014-05-03 (×3): qty 1

## 2014-05-03 MED ORDER — SODIUM CHLORIDE 0.9 % IV SOLN
INTRAVENOUS | Status: DC
  Administered 2014-05-06: 07:00:00 via INTRAVENOUS

## 2014-05-03 NOTE — Progress Notes (Signed)
Constipated  Plan Senna docusate x 1  Dr. Brand Males, M.D., [facility].C.P Pulmonary and Critical Care Medicine Staff Physician [place] Pulmonary and Critical Care Pager: [phone], If no answer or between  15:00h - 7:00h: call [phone]  05/03/2014 5:49 AM

## 2014-05-03 NOTE — Evaluation (Signed)
Occupational Therapy Evaluation and Discharge Patient Details Name: Tara Dunn MRN: [id number] DOB: [birth year] Today's Date: 05/03/2014    History of Present Illness 74 y.o. F brought to [facility] ED 3/23 with SOB due to hypertensive urgency. PMHx: HTN and breast cancer   Clinical Impression   This 74 yo female admitted with above presents to acute OT at a Mod I level for BADLs with use of a SPC. No further OT needs, we will sign off.    Follow Up Recommendations  No OT follow up    Equipment Recommendations  None recommended by OT       Precautions / Restrictions Precautions Precautions: Fall Precaution Comments: Pt reports that she has fallen 2 x in the last 4 months Restrictions Weight Bearing Restrictions: No      Mobility Bed Mobility               General bed mobility comments: Pt up in recliner upon arrival  Transfers Overall transfer level: Modified independent Equipment used: Straight cane                  Balance Overall balance assessment:  (defer to PT--pt says she is concerned about this)                                          ADL Overall ADL's : Modified independent                                       General ADL Comments: Educated pt and daughter on sock aid use and had pt use it to don her right sock x2--made her aware of where she could get it.     Vision Additional Comments: No change from baseline          Pertinent Vitals/Pain Pain Assessment: No/denies pain     Hand Dominance Right   Extremity/Trunk Assessment Upper Extremity Assessment Upper Extremity Assessment: Overall WFL for tasks assessed   Lower Extremity Assessment Lower Extremity Assessment: Defer to PT evaluation       Communication Communication Communication: No difficulties   Cognition Arousal/Alertness: Awake/alert Behavior During Therapy: WFL for tasks assessed/performed Overall Cognitive Status: Within  Functional Limits for tasks assessed                                Home Living Family/patient expects to be discharged to:: Private residence Living Arrangements: Alone Available Help at Discharge: Family;Available PRN/intermittently Type of Home: House Home Access: Elevator;Level entry           Bathroom Shower/Tub: Walk-in Hydrologist: Standard     Home Equipment: Bedside commode;Cane - single point   Additional Comments: Has thought about getting a shower seat--I encouraged her to get one from a safety standpoint      Prior Functioning/Environment Level of Independence: Independent with assistive device(s)             OT Diagnosis: Generalized weakness         OT Goals(Current goals can be found in the care plan section) Acute Rehab OT Goals Patient Stated Goal: home soon  OT Frequency:                End  of Session Equipment Utilized During Treatment:  [facility])  Activity Tolerance: Patient tolerated treatment well Patient left: in chair;with call bell/phone within reach;with family/visitor present   Time: 1610-9604 OT Time Calculation (min): 32 min Charges:  OT General Charges $OT Visit: 1 Procedure OT Evaluation $Initial OT Evaluation Tier I: 1 Procedure OT Treatments $Self Care/Home Management : 8-22 mins  Almon Register [phone] 05/03/2014, 2:38 PM

## 2014-05-03 NOTE — Consult Note (Signed)
CARDIOLOGY CONSULT NOTE   Patient ID: Tara Dunn MRN: [id number], DOB/AGE: [birth year]   Admit date: 05/01/2014 Date of Consult: 05/03/2014   Primary Physician: Deirdre Evener, MD Primary Cardiologist: new - seen by D. Bensimhon, MD   Pt. Profile  74 y/o female with a long h/o HTN, prior strokes, and CKD who was admitted on 3/23 with hypertensive emergency and has been found to have severe systolic dysfxn.  Problem List  Past Medical History  Diagnosis Date  . Hypertension     a. Dx @ age 76;  b. 04/2014 admission for HTN emergency.  . Cardiomyopathy     a. 04/2014 Echo: EF 25-30%, mod conc LVH, possibl antsept HK, Gr1 DD, mod-sev dil LA.  Marland Kitchen History of stroke     a. 3 strokes - last in 1998.  . Aortic dilatation     a. 04/2014 CTA chest w/ incidental finding of distal thoracic Ao enlargement of 3.39 mm - f/u needed 04/2015.  . Breast cancer     a. 2009 s/p L mastectomy and chemo.  . CKD (chronic kidney disease), stage III   . Gout   . Obesity     Past Surgical History  Procedure Laterality Date  . Mastectomy Left   . Abdominal hysterectomy      Allergies  Allergies  Allergen Reactions  . Shellfish Allergy Hives   HPI   74 y/o female with the above problem list.  She has a h/o HTN which was diagnosed at the age of 67.  She is s/p CVA x 3 (last 1998) and also has a h/o CKD.  She is originally from [place], [place] and moved to [place] 7 mos ago to be closer to her sister and son.  She says that prior to ever moving, she began to note increasing DOE but has never had chest pain/pressure.  Since living in [place], she has had progressive DOE and says that she could do very little walking prior to having to rest recently.  On Monday 3/21, her dyspnea worsened and was present at rest.  She also noted increasing lower ext edema.  Dyspnea progressed throughout the day on Monday and Tuesday and she was seen by her PCP on Wednesday.  From there, she was sent to the ED for admission.  Upon arrival,  she was markedly hypertensive @ 205/145.  She was seen by critical care and admitted for further eval.  She was initially placed on a nicardipine gtt and nitrates with prn hydralazine.  ECG with lbbb, poor r prog, lvh and repol abnormalities.  Trop 0.03 x 3.  D dimer elev.  CTA of chest neg for PE but notable for distal thoracic Ao dil (rec f/u CT 1 yr).  Following admission, BP improved and she was able to be transitioned to home dose of bystolic (titrated to 5mg  daily) and amlodipine 5 was added in addition to PO lasix.  She only required one dose of IV lasix this admission and she reports significant improvement in breathing and edema.  Echo was performed yesterday and showed an EF of 25-30% with antseptal HK and Gr 1 DD.  We have been asked to eval.  Inpatient Medications  . allopurinol  100 mg Oral Daily  . amLODipine  5 mg Oral Daily  . [START ON 05/04/2014] aspirin EC  325 mg Oral Daily  . atorvastatin  80 mg Oral q1800  . furosemide  20 mg Oral Daily  . heparin  5,000 Units Subcutaneous 3  times per day  . isosorbide-hydrALAZINE  1 tablet Oral TID  . nebivolol  5 mg Oral Daily  . pantoprazole  40 mg Oral Daily    Family History Family History  Problem Relation Age of Onset  . High blood pressure Mother     Died @ 52.  . High blood pressure Father   . Heart attack Father     Died in his early 64's.  . High blood pressure Sister      Social History History   Social History  . Marital Status: Single    Spouse Name: N/A  . Number of Children: N/A  . Years of Education: N/A   Occupational History  . Not on file.   Social History Main Topics  . Smoking status: Former Smoker -- 0.50 packs/day for 40 years    Types: Cigarettes  . Smokeless tobacco: Not on file     Comment: Quit in 2009.  Marland Kitchen Alcohol Use: No  . Drug Use: No  . Sexual Activity: Not on file   Other Topics Concern  . Not on file   Social History Narrative   Lives in [place].  Son and Sister nearby.  Moved here  from [place], [place] in Fall of 2015.  Does not routinely exericse.     Review of Systems  General:  No chills, fever, night sweats or weight changes.  Cardiovascular:  No chest pain, +++ dyspnea on exertion, +++ edema, no orthopnea, palpitations, paroxysmal nocturnal dyspnea. Dermatological: No rash, lesions/masses Respiratory: No cough, +++ dyspnea Urologic: No hematuria, dysuria Abdominal:   No nausea, vomiting, diarrhea, bright red blood per rectum, melena, or hematemesis Neurologic:  No visual changes, wkns, changes in mental status. All other systems reviewed and are otherwise negative except as noted above.  Physical Exam  Blood pressure 135/79, pulse 72, temperature 98.6 F (37 C), temperature source Oral, resp. rate 18, height 5\' 3"  (1.6 m), weight 198 lb 3.2 oz (89.903 kg), SpO2 98 %.  General: Pleasant, NAD Psych: Normal affect. Neuro: Alert and oriented X 3. Moves all extremities spontaneously. HEENT: Normal  Neck: Supple without bruits or JVD. Lungs:  Resp regular and unlabored, CTA. Heart: RRR no s3, s4, 2/6 SEM RUSB. Abdomen: Soft, non-tender, non-distended, BS + x 4.  Extremities: No clubbing, cyanosis or edema. DP/PT/Radials 2+ and equal bilaterally.  Labs   Recent Labs  05/01/14 2152 05/02/14 0420 05/02/14 0947  TROPONINI 0.03 0.03 0.03   Lab Results  Component Value Date   WBC 4.0 05/03/2014   HGB 13.8 05/03/2014   HCT 42.1 05/03/2014   MCV 83.2 05/03/2014   PLT 209 05/03/2014    Recent Labs Lab 05/03/14 0530  NA 139  K 3.5  CL 105  CO2 24  BUN 19  CREATININE 1.31*  CALCIUM 9.6  GLUCOSE 101*   Lab Results  Component Value Date   DDIMER 0.89* 05/01/2014    Radiology/Studies  Dg Chest 2 View  05/01/2014   CLINICAL DATA:  Shortness of breath beginning yesterday, former smoker, LEFT breast cancer post mastectomy in 2009, hypertension  EXAM: CHEST  2 VIEW  COMPARISON:  None  FINDINGS: Enlargement of cardiac silhouette.  Mild elongation of  thoracic aorta with atherosclerotic calcification.  Bronchitic changes with subsegmental atelectasis at RIGHT base.  No definite acute infiltrate, pleural effusion or pneumothorax.  Bones demineralized.  IMPRESSION: Enlargement of cardiac silhouette.  Bronchitic changes with subsegmental atelectasis RIGHT base.   Electronically Signed   By: Lavonia Dana  M.D.   On: 05/01/2014 15:33   Ct Angio Chest Pe W/cm &/or Wo Cm  05/01/2014   CLINICAL DATA:  Short of breath.  Cough.  Fever.  Chills.  EXAM: CT ANGIOGRAPHY CHEST WITH CONTRAST  TECHNIQUE: Multidetector CT imaging of the chest was performed using the standard protocol during bolus administration of intravenous contrast. Multiplanar CT image reconstructions and MIPs were obtained to evaluate the vascular anatomy.  CONTRAST:  100 mL Omnipaque 300.  COMPARISON:  Chest radiograph 05/01/2014.  FINDINGS: Bones: No aggressive osseous lesions. Age expected thoracic and lower cervical spondylosis.  Cardiovascular: Technically adequate study. No pulmonary embolism. No gross acute aortic abnormality.  Distal aortic arch measures 3.9 cm. Recommend annual imaging followup by CTA or MRA. This recommendation follows 2010 ACCF/AHA/AATS/ACR/ASA/SCA/SCAI/SIR/STS/SVM Guidelines for the Diagnosis and Management of Patients with Thoracic Aortic Disease. Circulation.2010; 121: C789-F810  Lungs: Dependent atelectasis is present. There is airspace disease in the dependent portions of both upper lobes and both lower lobes. This is superimposed on the atelectasis. Interlobular septal thickening is present at the apices. No focal airspace consolidation to suggest pneumonia or aspiration.  Central airways: Patent.  Effusions: Small pleural effusions bilaterally. No pericardial effusion.  Lymphadenopathy: LEFT mastectomy and axillary dissection with scarring in the LEFT axilla. No RIGHT axillary adenopathy. No mediastinal or hilar adenopathy. Calcified RIGHT hilar lymph nodes consistent with  old granulomatous disease.  Esophagus: Normal.  Upper abdomen: Atherosclerosis.  Other: None.  Review of the MIP images confirms the above findings.  IMPRESSION: 1. Negative for pulmonary embolism. 2. Constellation of findings compatible with mild to moderate CHF. Small bilateral pleural effusions and mild alveolar pulmonary edema along with interstitial edema. 3. 39 mm distal aortic arch. Recommend annual imaging followup by CTA or MRA. This recommendation follows 2010 ACCF/AHA/AATS/ACR/ASA/SCA/SCAI/SIR/STS/SVM Guidelines for the Diagnosis and Management of Patients with Thoracic Aortic Disease. Circulation.2010; 121: F751-W258   Electronically Signed   By: Dereck Ligas M.D.   On: 05/01/2014 20:00   Dg Chest Port 1 View  05/02/2014   CLINICAL DATA:  Edema.  Shortness of breath.  Cough.  EXAM: PORTABLE CHEST - 1 VIEW  COMPARISON:  CT 03/ 23/2016.  Chest x-ray 05/01/2014 .  FINDINGS: Stable thoracic aortic ectasia. Reference made to prior CT report. Cardiomegaly with mild pulmonary interstitial prominence again noted. These findings are again consistent with congestive heart failure. Tiny pleural effusions cannot be excluded. No pneumothorax. No acute osseus abnormality.  IMPRESSION: 1. Thoracic aortic ectasia again noted. Reference is made to prior CT report of 05/01/2014. Aortic contour is stable. 2. Mild congestive heart failure with mild interstitial edema. No significant interim change. Tiny bilateral pleural effusions cannot be excluded.   Electronically Signed   By: Marcello Moores  Register   On: 05/02/2014 07:30   ECG  Rsr, 69, lbbb, lvh w/ repol abnormalities, poor r prog.  2D Echocardiogram 3.24.2016  Study Conclusions  - Left ventricle: The cavity size was normal. There was moderate   concentric hypertrophy. Systolic function was severely reduced.   The estimated ejection fraction was in the range of 25% to 30%.   Possible hypokinesis of the anteroseptal myocardium. Doppler   parameters are  consistent with abnormal left ventricular   relaxation (grade 1 diastolic dysfunction). - Left atrium: The atrium was moderately to severely dilated. _____________   ASSESSMENT AND PLAN  1.  Acute systolic CHF/cardiomyopathy (? Ischemic/non-ischemic):  Pt presents with a nearly year long h/o progressive DOE with recent lower ext edema and dyspnea at rest.  She was admitted with hypertensive emergency and volume overload.  BP's are much improved and she is now on oral therapy including bystolic, amlodipine, and PO lasix.  In that setting, she has had clinical improvement as well.  Echo was done yesterday and shows and EF of 25-30% with Gr 1 DD and anteroseptal HK.  ECG notable for LBBB and poor R progression.  She does not have a prior h/o CAD or chest pain but does have RF including FH (father died in his 63's), longstanding HTN (since age 4), remote tobacco abuse (quit 2009), and obesity.  She will require an ischemic evaluation.  We have reviewed her CTA and there appears to be significant proximal LAD calcification/stenosis.  We will plan on diagnostic catheterization on Monday.  In the setting of CKD III we will hydrate her beginning early Monday morning.  Cont bb, hydralazine/nitrate.  No acei/arb/arni/spiro in setting CKD III and pending contrast w/ cath.  Add asa.  Cont statin.  She has an allergy to shellfish listed however she did not require prophylactic treatment prior to CTA of her chest.  2.  HTN:  Improved on current doses of bb/ccb/hydral/nitrate.  3.  Lipids:  Status unknown.  Check lipids/lft's.  Cont statin.  4.  CKD III:  Stable.  Hydrate pre-cath.  Creat did fine post CTA chest.  5.  Distal Thoracic Aortic dilation:  F/U CT in one year.  Signed, Murray Hodgkins, NP 05/03/2014, 4:24 PM   Patient seen and examined independently. Emeline Gins, NP note reviewed carefully - agree with his assessment and plan. I have edited the note based on my findings.   Patient with  multiple cardiac RFs admitted with acute systolic HF in setting of severe HTN. EF 25% with regional WMAs. I reviewed CT images personally and she has severe 3v coronary calcifications and on contrast images appears to also have very high grade LAD disease. Agree with current medical therapy. Will plan cath Monday. I have reviewed the risks, indications, and alternatives to angioplasty and stenting with the patient. Risks include but are not limited to bleeding, infection, vascular injury, stroke, myocardial infection, arrhythmia, kidney injury, radiation-related injury in the case of prolonged fluoroscopy use, emergency cardiac surgery, and death. The patient understands the risks of serious complication is low (<7%) and she agrees to proceed.   Daniel Bensimhon,MD 6:01 PM

## 2014-05-03 NOTE — Progress Notes (Addendum)
[facility] PROGRESS NOTE  Tara Dunn JGG:[id number] DOB: 17-May-1940 DOA: 05/01/2014 PCP: No primary care provider on file.  Brief Summary  Tara Dunn is a 74 y.o. F with PMH of HTN and breast CA who presented to [facility] ED 3/23 with SOB. Symptoms were present for past 2 months, but worsened over the 2 - 3 days prior to admission. She saw her PCP and was found to be hypertensive so she was sent to the ER.  In ED, BP noted to be 205 / 145.  She takes antihypertensives at home and stated that she did not take them on 3/22 but otherwise had not missed any doses. She was started on nitroglycerin infusion and was given lasix and hydralazine. PCCM was consulted for admission to ICU for nicardipine gtt.  Her blood pressures trended down with diuresis, norvasc, bystolic, and HCTZ.  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  ECHO revealed moderate concentric hypertrophy with EF 25-30% and possible hypokinesis of the anteroseptal myocardium.  Left atrium with mod to severe dilation.    Assessment/Plan  HTN emergency with acute on chronic diastolic heart failure and acute pulmonary edema, resolving but blood pressures still elevated - increase bystolic  - continue norvasc -  D/c HCTZ -  Add lasix - add bidil - outpt assessment for sleep apnea  Acute on chronic systolic and diastolic heart failure probably due to uncontrolled hypertension, however, there is a suggestion of wall-motion abnormality and therefore possible CAD -  Unknown previous EF -  Continue ASA, BB -  Lipid panel -  Add high dose statin -  Consider addition of ACEI if creatinine remains stable -  Add bidil TID -  Add lasix -  If creatinine and potassium stable, plan to add ACEI and spironolactone -  Tele:  Sinus bradycardia, no VT -  Cardiology consult for possible inpatient ischemic work up  Distal thoracic aorta arch enlargement. - will need repeat CT chest in March  2017  CKD stage III, creatinine stable - minimize nephrotoxins  Hx of Gout, stable - allopurinol  Hx of CVA. - increase to ASA 325 mg daily for secondary prevention   Diet:  Healthy heart  Access:  PIV IVF:  off Proph:  heparin  Code Status: full Family Communication: patient alone Disposition Plan:  PT/OT pending   Consultants:  Cardiology  PCCM   Procedures:  CT angio chest  ECHO  Antibiotics:  none   HPI/Subjective:  Feeling much better but still somewhat SOB.  HA resolved.  Denies chest pain, nausea, vomiting.    Objective: Filed Vitals:   05/02/14 2143 05/03/14 0203 05/03/14 0605 05/03/14 1050  BP: 163/102 152/94 160/96 157/92  Pulse: 67 69 63 65  Temp: 98.8 F (37.1 C) 98.1 F (36.7 C) 98.5 F (36.9 C)   TempSrc: Oral Oral Oral   Resp: 16 17 18    Height:      Weight:   89.903 kg (198 lb 3.2 oz)   SpO2: 98% 96% 93%     Intake/Output Summary (Last 24 hours) at 05/03/14 1301 Last data filed at 05/03/14 1049  Gross per 24 hour  Intake    685 ml  Output    325 ml  Net    360 ml   Filed Weights   05/02/14 0600 05/02/14 1847 05/03/14 0605  Weight: 88.8 kg (195 lb 12.3 oz) 90.266 kg (199 lb) 89.903 kg (198 lb 3.2 oz)    Exam:  General:  Adult female, No acute distress  HEENT:  NCAT, MMM  Cardiovascular:  RRR, nl S1, S2, possible mid-systolic murmur, 2+ pulses, warm extremities  Respiratory:  Faint rales at bilateral bases, no wheezes or rhonchi, no increased WOB  Abdomen:   NABS, soft, NT/ND  MSK:   Normal tone and bulk, no LEE  Neuro:  Grossly intact  Data Reviewed: Basic Metabolic Panel:  Recent Labs Lab 05/01/14 1424 05/01/14 2152 05/02/14 0420 05/03/14 0530  NA 142  --  139 139  K 4.0  --  3.2* 3.5  CL 108  --  105 105  CO2 27  --  25 24  GLUCOSE 97  --  101* 101*  BUN 22  --  18 19  CREATININE 1.34* 1.26* 1.18* 1.31*  CALCIUM 9.7  --  9.5 9.6  MG  --   --  1.6  --   PHOS  --   --  3.2  --    Liver Function  Tests: No results for input(s): AST, ALT, ALKPHOS, BILITOT, PROT, ALBUMIN in the last 168 hours. No results for input(s): LIPASE, AMYLASE in the last 168 hours. No results for input(s): AMMONIA in the last 168 hours. CBC:  Recent Labs Lab 05/01/14 1424 05/01/14 2152 05/02/14 0420 05/03/14 0530  WBC 4.5 5.0 5.4 4.0  HGB 14.0 15.1* 13.9 13.8  HCT 41.7 44.5 42.0 42.1  MCV 83.7 82.7 83.5 83.2  PLT 201 195 198 209   Cardiac Enzymes:  Recent Labs Lab 05/01/14 2152 05/02/14 0420 05/02/14 0947  TROPONINI 0.03 0.03 0.03   BNP (last 3 results)  Recent Labs  05/01/14 1424  BNP 433.8*    ProBNP (last 3 results) No results for input(s): PROBNP in the last 8760 hours.  CBG: No results for input(s): GLUCAP in the last 168 hours.  Recent Results (from the past 240 hour(s))  MRSA PCR Screening     Status: None   Collection Time: 05/01/14 10:30 PM  Result Value Ref Range Status   MRSA by PCR NEGATIVE NEGATIVE Final    Comment:        The GeneXpert MRSA Assay (FDA approved for NASAL specimens only), is one component of a comprehensive MRSA colonization surveillance program. It is not intended to diagnose MRSA infection nor to guide or monitor treatment for MRSA infections.      Studies: Dg Chest 2 View  05/01/2014   CLINICAL DATA:  Shortness of breath beginning yesterday, former smoker, LEFT breast cancer post mastectomy in 2009, hypertension  EXAM: CHEST  2 VIEW  COMPARISON:  None  FINDINGS: Enlargement of cardiac silhouette.  Mild elongation of thoracic aorta with atherosclerotic calcification.  Bronchitic changes with subsegmental atelectasis at RIGHT base.  No definite acute infiltrate, pleural effusion or pneumothorax.  Bones demineralized.  IMPRESSION: Enlargement of cardiac silhouette.  Bronchitic changes with subsegmental atelectasis RIGHT base.   Electronically Signed   By: Lavonia Dana M.D.   On: 05/01/2014 15:33   Ct Angio Chest Pe W/cm &/or Wo Cm  05/01/2014    CLINICAL DATA:  Tara Dunn of breath.  Cough.  Fever.  Chills.  EXAM: CT ANGIOGRAPHY CHEST WITH CONTRAST  TECHNIQUE: Multidetector CT imaging of the chest was performed using the standard protocol during bolus administration of intravenous contrast. Multiplanar CT image reconstructions and MIPs were obtained to evaluate the vascular anatomy.  CONTRAST:  100 mL Omnipaque 300.  COMPARISON:  Chest radiograph 05/01/2014.  FINDINGS: Bones: No aggressive osseous lesions. Age expected thoracic  and lower cervical spondylosis.  Cardiovascular: Technically adequate study. No pulmonary embolism. No gross acute aortic abnormality.  Distal aortic arch measures 3.9 cm. Recommend annual imaging followup by CTA or MRA. This recommendation follows 2010 ACCF/AHA/AATS/ACR/ASA/SCA/SCAI/SIR/STS/SVM Guidelines for the Diagnosis and Management of Patients with Thoracic Aortic Disease. Circulation.2010; 121: T557-D220  Lungs: Dependent atelectasis is present. There is airspace disease in the dependent portions of both upper lobes and both lower lobes. This is superimposed on the atelectasis. Interlobular septal thickening is present at the apices. No focal airspace consolidation to suggest pneumonia or aspiration.  Central airways: Patent.  Effusions: Small pleural effusions bilaterally. No pericardial effusion.  Lymphadenopathy: LEFT mastectomy and axillary dissection with scarring in the LEFT axilla. No RIGHT axillary adenopathy. No mediastinal or hilar adenopathy. Calcified RIGHT hilar lymph nodes consistent with old granulomatous disease.  Esophagus: Normal.  Upper abdomen: Atherosclerosis.  Other: None.  Review of the MIP images confirms the above findings.  IMPRESSION: 1. Negative for pulmonary embolism. 2. Constellation of findings compatible with mild to moderate CHF. Small bilateral pleural effusions and mild alveolar pulmonary edema along with interstitial edema. 3. 39 mm distal aortic arch. Recommend annual imaging followup by CTA or  MRA. This recommendation follows 2010 ACCF/AHA/AATS/ACR/ASA/SCA/SCAI/SIR/STS/SVM Guidelines for the Diagnosis and Management of Patients with Thoracic Aortic Disease. Circulation.2010; 121: U542-H062   Electronically Signed   By: Dereck Ligas M.D.   On: 05/01/2014 20:00   Dg Chest Port 1 View  05/02/2014   CLINICAL DATA:  Edema.  Shortness of breath.  Cough.  EXAM: PORTABLE CHEST - 1 VIEW  COMPARISON:  CT 03/ 23/2016.  Chest x-ray 05/01/2014 .  FINDINGS: Stable thoracic aortic ectasia. Reference made to prior CT report. Cardiomegaly with mild pulmonary interstitial prominence again noted. These findings are again consistent with congestive heart failure. Tiny pleural effusions cannot be excluded. No pneumothorax. No acute osseus abnormality.  IMPRESSION: 1. Thoracic aortic ectasia again noted. Reference is made to prior CT report of 05/01/2014. Aortic contour is stable. 2. Mild congestive heart failure with mild interstitial edema. No significant interim change. Tiny bilateral pleural effusions cannot be excluded.   Electronically Signed   By: [place]   On: 05/02/2014 07:30    Scheduled Meds: . allopurinol  100 mg Oral Daily  . amLODipine  5 mg Oral Daily  . aspirin  81 mg Oral Daily  . heparin  5,000 Units Subcutaneous 3 times per day  . hydrochlorothiazide  25 mg Oral Daily  . nebivolol  5 mg Oral Daily  . pantoprazole  40 mg Oral Daily   Continuous Infusions:   Active Problems:   Hypertensive urgency    Time spent: 30 min    Tameah Mihalko, [facility] Pager [phone]. If 7PM-7AM, please contact night-coverage at [email or web address], password [facility] 05/03/2014, 1:01 PM  LOS: 2 days

## 2014-05-03 NOTE — Clinical Social Work Note (Signed)
Patient moved to [place] informed unit CSW that patient is for potential SNF placement based on social work consult however PT and OT have not seen patient yet.  CSW contacted [employer] [facility] representative to determine if patient is [facility] or not, left a message awaiting call back.  Jones Broom. [place], MSW, [place] 05/03/2014 8:16 AM

## 2014-05-03 NOTE — Evaluation (Signed)
Physical Therapy Evaluation Patient Details Name: Tara Dunn MRN: [id number] DOB: 1940-09-29 Today's Date: 05/03/2014   History of Present Illness  74 y.o. F brought to [facility] ED 3/23 with SOB due to hypertensive urgency. PMHx: HTN and breast cancer, CVA, gout, CHF, CKD  Clinical Impression  Patient presents with problems listed below.  Will benefit from acute PT to maximize independence prior to return home with prn assist.  Recommended to patient and daughter that patient use a RW at home for balance/safety.  Also recommend f/u HHPT to continue therapy for balance/mobility.    Follow Up Recommendations Home health PT;Supervision - Intermittent    Equipment Recommendations  Rolling walker with 5" wheels    Recommendations for Other Services       Precautions / Restrictions Precautions Precautions: Fall Precaution Comments: Pt reports that she has fallen 2 x in the last 4 months.  Was not using AD during falls Restrictions Weight Bearing Restrictions: No      Mobility  Bed Mobility               General bed mobility comments: Pt up in recliner upon arrival  Transfers Overall transfer level: Modified independent Equipment used: Straight cane             General transfer comment: No physical assist required to move to standing.  Good static standing balance.  Ambulation/Gait Ambulation/Gait assistance: Min guard Ambulation Distance (Feet): 86 Feet Assistive device: Straight cane;Rolling walker (2 wheeled) Gait Pattern/deviations: Step-through pattern;Decreased stride length;Shuffle;Staggering left;Staggering right Gait velocity: Decreased Gait velocity interpretation: Below normal speed for age/gender General Gait Details: Patient with very slow gait using cane.  Noted staggering gait to both sides, with decreased balance.  Had patient use RW - improved gait stability/balance.  Stairs            Wheelchair Mobility    Modified Rankin (Stroke  Patients Only)       Balance Overall balance assessment: Needs assistance         Standing balance support: Single extremity supported;During functional activity Standing balance-Leahy Scale: Poor Standing balance comment: Requires UE support for dynamic balance in standing.  Balance improves with use of RW                             Pertinent Vitals/Pain Pain Assessment: No/denies pain    Home Living Family/patient expects to be discharged to:: Private residence Living Arrangements: Alone Available Help at Discharge: Family;Available PRN/intermittently Type of Home: Apartment Home Access: Elevator;Level entry     Home Layout: One level Home Equipment: Bedside commode;Cane - single point Additional Comments: Has thought about getting a shower seat--I encouraged her to get one from a safety standpoint    Prior Function Level of Independence: Independent with assistive device(s)         Comments: Used cane pta     Hand Dominance   Dominant Hand: Right    Extremity/Trunk Assessment   Upper Extremity Assessment: Overall WFL for tasks assessed           Lower Extremity Assessment: Generalized weakness      Cervical / Trunk Assessment: Normal  Communication   Communication: No difficulties  Cognition Arousal/Alertness: Awake/alert Behavior During Therapy: WFL for tasks assessed/performed Overall Cognitive Status: Within Functional Limits for tasks assessed                      General Comments  Exercises General Exercises - Lower Extremity Long Arc Quad: AROM;Both;10 reps;Seated Hip Flexion/Marching: AROM;Both;10 reps;Seated Toe Raises: AROM;Both;10 reps;Seated Heel Raises: AROM;Both;10 reps;Seated      Assessment/Plan    PT Assessment Patient needs continued PT services  PT Diagnosis Abnormality of gait;Generalized weakness   PT Problem List Decreased strength;Decreased activity tolerance;Decreased balance;Decreased  mobility;Decreased knowledge of use of DME;Cardiopulmonary status limiting activity  PT Treatment Interventions DME instruction;Gait training;Functional mobility training;Therapeutic activities;Therapeutic exercise;Balance training;Patient/family education   PT Goals (Current goals can be found in the Care Plan section) Acute Rehab PT Goals Patient Stated Goal: home soon PT Goal Formulation: With patient/family Time For Goal Achievement: 05/10/14 Potential to Achieve Goals: Good    Frequency Min 3X/week   Barriers to discharge Decreased caregiver support Patient lives alone.    Co-evaluation               End of Session Equipment Utilized During Treatment: Gait belt Activity Tolerance: Patient limited by fatigue Patient left: in chair;with call bell/phone within reach;with family/visitor present Nurse Communication: Mobility status         Time: 9741-6384 PT Time Calculation (min) (ACUTE ONLY): 18 min   Charges:   PT Evaluation $Initial PT Evaluation Tier I: 1 Procedure     PT G CodesDespina Pole May 29, 2014, 3:10 PM Carita Pian. Sanjuana Kava, [place] Pager [phone]

## 2014-05-04 DIAGNOSIS — I509 Heart failure, unspecified: Secondary | ICD-10-CM | POA: Insufficient documentation

## 2014-05-04 LAB — BASIC METABOLIC PANEL
ANION GAP: 7 (ref 5–15)
BUN: 24 mg/dL — ABNORMAL HIGH (ref 6–23)
CALCIUM: 9.7 mg/dL (ref 8.4–10.5)
CO2: 27 mmol/L (ref 19–32)
Chloride: 102 mmol/L (ref 96–112)
Creatinine, Ser: 1.45 mg/dL — ABNORMAL HIGH (ref 0.50–1.10)
GFR calc non Af Amer: 35 mL/min — ABNORMAL LOW (ref >90)
GFR, EST AFRICAN AMERICAN: 40 mL/min — AB (ref >90)
GLUCOSE: 105 mg/dL — AB (ref 70–99)
Potassium: 3.8 mmol/L (ref 3.5–5.1)
SODIUM: 136 mmol/L (ref 135–145)

## 2014-05-04 LAB — HEPATIC FUNCTION PANEL
ALT: 17 U/L (ref 0–35)
AST: 23 U/L (ref 0–37)
Albumin: 3.4 g/dL — ABNORMAL LOW (ref 3.5–5.2)
Alkaline Phosphatase: 94 U/L (ref 39–117)
BILIRUBIN INDIRECT: 0.5 mg/dL (ref 0.3–0.9)
BILIRUBIN TOTAL: 0.7 mg/dL (ref 0.3–1.2)
Bilirubin, Direct: 0.2 mg/dL (ref 0.0–0.5)
Total Protein: 6.2 g/dL (ref 6.0–8.3)

## 2014-05-04 LAB — LIPID PANEL
CHOLESTEROL: 142 mg/dL (ref 0–200)
HDL: 48 mg/dL (ref >39)
LDL Cholesterol: 70 mg/dL (ref 0–99)
Total CHOL/HDL Ratio: 3 RATIO
Triglycerides: 119 mg/dL (ref <150)
VLDL: 24 mg/dL (ref 0–40)

## 2014-05-04 MED ORDER — ALUM & MAG HYDROXIDE-SIMETH 200-200-20 MG/5ML PO SUSP
15.0000 mL | ORAL | Status: DC | PRN
  Administered 2014-05-05: 15 mL via ORAL
  Filled 2014-05-04: qty 30

## 2014-05-04 MED ORDER — ISOSORB DINITRATE-HYDRALAZINE 20-37.5 MG PO TABS
2.0000 | ORAL_TABLET | Freq: Three times a day (TID) | ORAL | Status: DC
  Administered 2014-05-04 – 2014-05-08 (×12): 2 via ORAL
  Filled 2014-05-04 (×14): qty 2

## 2014-05-04 NOTE — Progress Notes (Signed)
[facility] PROGRESS NOTE  Tara Dunn WVP:[id number] DOB: 10-Oct-1940 DOA: 05/01/2014 PCP: No primary care provider on file.  Brief Summary  Tara Dunn is a 74 y.o. F with PMH of HTN and breast CA who presented to [facility] ED 3/23 with SOB. Symptoms were present for past 2 months, but worsened over the 2 - 3 days prior to admission. She saw her PCP and was found to be hypertensive so she was sent to the ER.  In ED, BP noted to be 205 / 145.  She takes antihypertensives at home and stated that she did not take them on 3/22 but otherwise had not missed any doses. She was started on nitroglycerin infusion and was given lasix and hydralazine. PCCM was consulted for admission to ICU for nicardipine gtt.  Her blood pressures trended down with diuresis, norvasc, bystolic, and HCTZ.  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  ECHO revealed moderate concentric hypertrophy with EF 25-30% and possible hypokinesis of the anteroseptal myocardium.  Left atrium with mod to severe dilation.    Assessment/Plan  HTN emergency with acute on chronic diastolic heart failure and acute pulmonary edema, resolving but blood pressures still elevated -  Continue bystolic  -  Continue norvasc -  Continue lasix -  Increase bidil to 2 tabs TID - outpt assessment for sleep apnea  Acute on chronic systolic and diastolic heart failure, likely ischemic -  EF 25-30%, possible hypokinesis of the anteroseptal myocardium.  Left atrium with mod to severe dilation.   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 - will need repeat CT chest in March 2017  CKD stage III,  creatinine stable - minimize nephrotoxins  Hx of Gout, stable - allopurinol  Hx of CVA - should be on ASA 325 mg daily for secondary prevention of stroke  Diet:  Healthy heart  Access:  PIV IVF:  off Proph:  heparin  Code Status: full Family Communication: patient alone Disposition Plan:  PT/OT recommending HH PT   Consultants:  Cardiology  PCCM   Procedures:  CT angio chest  ECHO  Antibiotics:  none   HPI/Subjective:  Feeling much better.  Denies SOB.  Having a lot of gas.  HA resolved.  Denies chest pain    Objective: Filed Vitals:   05/03/14 2149 05/04/14 0139 05/04/14 0614 05/04/14 1021  BP: 154/86 157/66 141/71 149/76  Pulse: 74 76 69   Temp: 97.9 F (36.6 C) 97.9 F (36.6 C) 98.1 F (36.7 C)   TempSrc: Oral Oral Oral   Resp: 18 18 18    Height:      Weight:   90.447 kg (199 lb 6.4 oz)   SpO2: 99% 98% 98%     Intake/Output Summary (Last 24 hours) at 05/04/14 1057 Last data filed at 05/04/14 0900  Gross per 24 hour  Intake    660 ml  Output    400 ml  Net    260 ml   Filed Weights   05/02/14 1847 05/03/14 0605 05/04/14 0614  Weight: 90.266 kg (199 lb) 89.903 kg (198 lb 3.2 oz) 90.447  kg (199 lb 6.4 oz)    Exam:   General:  Adult female, No acute distress  HEENT:  NCAT, MMM  Cardiovascular:  RRR, nl S1, S2, possible mid-systolic murmur, 2+ pulses, warm extremities  Respiratory:  CTAB, no increased WOB  Abdomen:   NABS, soft, NT/ND  MSK:   Normal tone and bulk, no LEE  Neuro:  Grossly intact  Data Reviewed: Basic Metabolic Panel:  Recent Labs Lab 05/01/14 1424 05/01/14 2152 05/02/14 0420 05/03/14 0530 05/04/14 0334  NA 142  --  139 139 136  K 4.0  --  3.2* 3.5 3.8  CL 108  --  105 105 102  CO2 27  --  25 24 27   GLUCOSE 97  --  101* 101* 105*  BUN 22  --  18 19 24*  CREATININE 1.34* 1.26* 1.18* 1.31* 1.45*  CALCIUM 9.7  --  9.5 9.6 9.7  MG  --   --  1.6  --   --   PHOS  --   --  3.2  --   --    Liver Function  Tests:  Recent Labs Lab 05/04/14 0334  AST 23  ALT 17  ALKPHOS 94  BILITOT 0.7  PROT 6.2  ALBUMIN 3.4*   No results for input(s): LIPASE, AMYLASE in the last 168 hours. No results for input(s): AMMONIA in the last 168 hours. CBC:  Recent Labs Lab 05/01/14 1424 05/01/14 2152 05/02/14 0420 05/03/14 0530  WBC 4.5 5.0 5.4 4.0  HGB 14.0 15.1* 13.9 13.8  HCT 41.7 44.5 42.0 42.1  MCV 83.7 82.7 83.5 83.2  PLT 201 195 198 209   Cardiac Enzymes:  Recent Labs Lab 05/01/14 2152 05/02/14 0420 05/02/14 0947  TROPONINI 0.03 0.03 0.03   BNP (last 3 results)  Recent Labs  05/01/14 1424  BNP 433.8*    ProBNP (last 3 results) No results for input(s): PROBNP in the last 8760 hours.  CBG: No results for input(s): GLUCAP in the last 168 hours.  Recent Results (from the past 240 hour(s))  MRSA PCR Screening     Status: None   Collection Time: 05/01/14 10:30 PM  Result Value Ref Range Status   MRSA by PCR NEGATIVE NEGATIVE Final    Comment:        The GeneXpert MRSA Assay (FDA approved for NASAL specimens only), is one component of a comprehensive MRSA colonization surveillance program. It is not intended to diagnose MRSA infection nor to guide or monitor treatment for MRSA infections.      Studies: No results found.  Scheduled Meds: . allopurinol  100 mg Oral Daily  . amLODipine  5 mg Oral Daily  . aspirin EC  81 mg Oral Daily  . atorvastatin  80 mg Oral q1800  . furosemide  20 mg Oral Daily  . heparin  5,000 Units Subcutaneous 3 times per day  . isosorbide-hydrALAZINE  1 tablet Oral TID  . nebivolol  5 mg Oral Daily  . pantoprazole  40 mg Oral Daily   Continuous Infusions: . [START ON 05/06/2014] sodium chloride      Active Problems:   Hypertensive urgency   Acute on chronic combined systolic and diastolic heart failure   CKD (chronic kidney disease), stage III   Hx of stroke without residual deficits   Hypertensive emergency    Time spent: 30  min    Desman Polak, [place] [facility] Pager [phone]. If 7PM-7AM, please contact night-coverage at [email or web address], password [facility] 05/04/2014, 10:57  AM  LOS: 3 days

## 2014-05-04 NOTE — Progress Notes (Signed)
Pt had nonsustained bradycardic episode while sleeping HR went down to the 30's. Pt asymptomatic. Continued to observe pt closely

## 2014-05-04 NOTE — Patient Instructions (Addendum)
  Heel Raise (Sitting)   Raise heels, keeping toes on floor. Repeat __10__ times per set. Do _2___ sessions per day.  Copyright  VHI. All rights reserved.  Toe Raise (Sitting)   Raise toes, keeping heels on floor. Repeat _10___ times per set. Do _2___ sessions per day.   Copyright  VHI. All rights reserved.  HIP / KNEE: Flexion - Sitting   Raise knee to chest. Keep back straight, knee bent. __10_ reps per set, 2 times per day  Copyright  VHI. All rights reserved.  KNEE: Extension, Long Arc Quads - Sitting   Raise leg until knee is straight. _10__ reps per set, _2__ sets per day  Copyright  VHI. All rights reserved.

## 2014-05-04 NOTE — Progress Notes (Signed)
DAILY PROGRESS NOTE  Subjective:  No chest pain. Diuresed about 1L overnight. Remains hypertensive - plan to increase bidil today. 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  Objective:  Temp:  [97.9 F (36.6 C)-98.6 F (37 C)] 98.1 F (36.7 C) (03/26 0614) Pulse Rate:  [68-76] 69 (03/26 0614) Resp:  [18] 18 (03/26 0614) BP: (135-157)/(66-86) 149/76 mmHg (03/26 1021) SpO2:  [98 %-99 %] 98 % (03/26 0614) Weight:  [199 lb 6.4 oz (90.447 kg)] 199 lb 6.4 oz (90.447 kg) (03/26 0614) Weight change: 6.4 oz (0.181 kg)  Intake/Output from previous day: 03/25 0701 - 03/26 0700 In: 420 [P.O.:420] Out: 400 [Urine:400]  Intake/Output from this shift: Total I/O In: 360 [P.O.:360] Out: 300 [Urine:300]  Medications: Current Facility-Administered Medications  Medication Dose Route Frequency Provider Last Rate Last Dose  . 0.9 %  sodium chloride infusion  250 mL Intravenous PRN Rahul P Desai, PA-C 20 mL/hr at 05/02/14 0400 250 mL at 05/02/14 0400  . [START ON 05/06/2014] 0.9 %  sodium chloride infusion   Intravenous Continuous Rogelia Mire, NP      . allopurinol (ZYLOPRIM) tablet 100 mg  100 mg Oral Daily Rahul P Desai, PA-C   100 mg at 05/04/14 1013  . alum & mag hydroxide-simeth (MAALOX/MYLANTA) 200-200-20 MG/5ML suspension 15 mL  15 mL Oral Q4H PRN Janece Canterbury, MD      . amLODipine (NORVASC) tablet 5 mg  5 mg Oral Daily Janece Canterbury, MD   5 mg at 05/04/14 1013  . aspirin EC tablet 81 mg  81 mg Oral Daily Rogelia Mire, NP   81 mg at 05/04/14 1013  . atorvastatin (LIPITOR) tablet 80 mg  80 mg Oral q1800 Janece Canterbury, MD   80 mg at 05/03/14 1704  . furosemide (LASIX) tablet 20 mg  20 mg Oral Daily Janece Canterbury, MD   20 mg at 05/04/14 1013  . heparin injection 5,000 Units  5,000 Units Subcutaneous 3 times per day Rahul Dianna Rossetti, PA-C   5,000 Units at 05/04/14 0548  . hydrALAZINE (APRESOLINE) injection 10-40 mg  10-40 mg Intravenous Q4H PRN Wilhelmina Mcardle, MD   10 mg at 05/02/14 2154  . isosorbide-hydrALAZINE (BIDIL) 20-37.5 MG per tablet 2 tablet  2 tablet Oral TID Janece Canterbury, MD      . nebivolol (BYSTOLIC) tablet 5 mg  5 mg Oral Daily Janece Canterbury, MD   5 mg at 05/04/14 1014  . pantoprazole (PROTONIX) EC tablet 40 mg  40 mg Oral Daily Rahul P Desai, PA-C   40 mg at 05/04/14 1013    Physical Exam: General appearance: alert and no distress Lungs: clear to auscultation bilaterally Heart: regular rate and rhythm, S1, S2 normal, no murmur, click, rub or gallop Extremities: extremities normal, atraumatic, no cyanosis or edema Neurologic: Grossly normal  Lab Results: Results for orders placed or performed during the hospital encounter of 05/01/14 (from the past 48 hour(s))  Basic metabolic panel     Status: Abnormal   Collection Time: 05/03/14  5:30 AM  Result Value Ref Range   Sodium 139 135 - 145 mmol/L   Potassium 3.5 3.5 - 5.1 mmol/L   Chloride 105 96 - 112 mmol/L   CO2 24 19 - 32 mmol/L   Glucose, Bld 101 (H) 70 - 99 mg/dL   BUN 19 6 - 23 mg/dL   Creatinine, Ser 1.31 (H) 0.50 - 1.10 mg/dL   Calcium 9.6 8.4 - 10.5 mg/dL  GFR calc non Af Amer 39 (L) >90 mL/min   GFR calc Af Amer 45 (L) >90 mL/min    Comment: (NOTE) The eGFR has been calculated using the CKD EPI equation. This calculation has not been validated in all clinical situations. eGFR's persistently <90 mL/min signify possible Chronic Kidney Disease.    Anion gap 10 5 - 15  CBC     Status: Abnormal   Collection Time: 05/03/14  5:30 AM  Result Value Ref Range   WBC 4.0 4.0 - 10.5 K/uL   RBC 5.06 3.87 - 5.11 MIL/uL   Hemoglobin 13.8 12.0 - 15.0 g/dL   HCT 42.1 36.0 - 46.0 %   MCV 83.2 78.0 - 100.0 fL   MCH 27.3 26.0 - 34.0 pg   MCHC 32.8 30.0 - 36.0 g/dL   RDW 15.6 (H) 11.5 - 15.5 %   Platelets 209 150 - 400 K/uL  Basic metabolic panel     Status: Abnormal   Collection Time: 05/04/14  3:34 AM  Result Value Ref Range   Sodium 136 135 - 145 mmol/L     Potassium 3.8 3.5 - 5.1 mmol/L   Chloride 102 96 - 112 mmol/L   CO2 27 19 - 32 mmol/L   Glucose, Bld 105 (H) 70 - 99 mg/dL   BUN 24 (H) 6 - 23 mg/dL   Creatinine, Ser 1.45 (H) 0.50 - 1.10 mg/dL   Calcium 9.7 8.4 - 10.5 mg/dL   GFR calc non Af Amer 35 (L) >90 mL/min   GFR calc Af Amer 40 (L) >90 mL/min    Comment: (NOTE) The eGFR has been calculated using the CKD EPI equation. This calculation has not been validated in all clinical situations. eGFR's persistently <90 mL/min signify possible Chronic Kidney Disease.    Anion gap 7 5 - 15  Hepatic function panel     Status: Abnormal   Collection Time: 05/04/14  3:34 AM  Result Value Ref Range   Total Protein 6.2 6.0 - 8.3 g/dL   Albumin 3.4 (L) 3.5 - 5.2 g/dL   AST 23 0 - 37 U/L   ALT 17 0 - 35 U/L   Alkaline Phosphatase 94 39 - 117 U/L   Total Bilirubin 0.7 0.3 - 1.2 mg/dL   Bilirubin, Direct 0.2 0.0 - 0.5 mg/dL   Indirect Bilirubin 0.5 0.3 - 0.9 mg/dL  Lipid panel     Status: None   Collection Time: 05/04/14  3:34 AM  Result Value Ref Range   Cholesterol 142 0 - 200 mg/dL   Triglycerides 119 <150 mg/dL   HDL 48 >39 mg/dL   Total CHOL/HDL Ratio 3.0 RATIO   VLDL 24 0 - 40 mg/dL   LDL Cholesterol 70 0 - 99 mg/dL    Comment:        Total Cholesterol/HDL:CHD Risk Coronary Heart Disease Risk Table                     Men   Women  1/2 Average Risk   3.4   3.3  Average Risk       5.0   4.4  2 X Average Risk   9.6   7.1  3 X Average Risk  23.4   11.0        Use the calculated Patient Ratio above and the CHD Risk Table to determine the patient's CHD Risk.        ATP III CLASSIFICATION (LDL):  <100  mg/dL   Optimal  100-129  mg/dL   Near or Above                    Optimal  130-159  mg/dL   Borderline  160-189  mg/dL   High  >190     mg/dL   Very High     Imaging: No results found.  Assessment:  Principal Problem:   Acute on chronic combined systolic and diastolic heart failure Active Problems:   CKD  (chronic kidney disease), stage III   Hx of stroke without residual deficits   Hypertensive emergency   Plan:  1.  Breathing has improved. Agree with increasing bidil. Plan for [facility] on Monday. Continue current medications. Continue diuresis.  Time Spent Directly with Patient:  15 minutes  Length of Stay:  LOS: 3 days   Pixie Casino, MD, [facility] Attending Cardiologist [facility]  HILTY,Kenneth C 05/04/2014, 1:46 PM

## 2014-05-05 LAB — BASIC METABOLIC PANEL
Anion gap: 8 (ref 5–15)
BUN: 26 mg/dL — ABNORMAL HIGH (ref 6–23)
CO2: 23 mmol/L (ref 19–32)
Calcium: 9.5 mg/dL (ref 8.4–10.5)
Chloride: 104 mmol/L (ref 96–112)
Creatinine, Ser: 1.51 mg/dL — ABNORMAL HIGH (ref 0.50–1.10)
GFR calc Af Amer: 38 mL/min — ABNORMAL LOW (ref >90)
GFR calc non Af Amer: 33 mL/min — ABNORMAL LOW (ref >90)
Glucose, Bld: 98 mg/dL (ref 70–99)
Potassium: 4 mmol/L (ref 3.5–5.1)
SODIUM: 135 mmol/L (ref 135–145)

## 2014-05-05 NOTE — Progress Notes (Signed)
[facility] PROGRESS NOTE  Tara Dunn KAJ:[id number] DOB: 1940-03-28 DOA: 05/01/2014 PCP: No primary care provider on file.  Brief Summary  Tara Dunn is a 74 y.o. F with PMH of HTN and breast CA who presented to [facility] ED 3/23 with SOB. Symptoms were present for past 2 months, but worsened over the 2 - 3 days prior to admission. She saw her PCP and was found to be hypertensive so she was sent to the ER.  In ED, BP noted to be 205 / 145.  She takes antihypertensives at home and stated that she did not take them on 3/22 but otherwise had not missed any doses. She was started on nitroglycerin infusion and was given lasix and hydralazine. PCCM was consulted for admission to ICU for nicardipine gtt.  Her blood pressures trended down with diuresis, norvasc, bystolic, and HCTZ.  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  ECHO revealed moderate concentric hypertrophy with EF 25-30% and possible hypokinesis of the anteroseptal myocardium.  Left atrium with mod to severe dilation.  Cardiology consulted and planning cath for Monday  Assessment/Plan  HTN emergency with acute on chronic diastolic heart failure and acute pulmonary edema, BP improved -  Continue bystolic, norvasc -  Continue lasix -  Continue bidil to 2 tabs TID - outpt assessment for sleep apnea  Acute on chronic systolic and diastolic heart failure, resolved. -  Weight and I/O stable -  EF 25-30%, possible hypokinesis of the anteroseptal myocardium.  Left atrium with mod to severe dilation.   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 - will need repeat CT chest in March 2017  CKD stage III, creatinine stable - minimize nephrotoxins  Hx of Gout, stable - allopurinol  Hx of CVA - should be on ASA 325 mg daily for secondary prevention of stroke  Diet:  Healthy heart  Access:  PIV IVF:  off Proph:  heparin  Code Status: full Family Communication: patient alone Disposition Plan:  PT/OT recommending HH PT.     Consultants:  Cardiology  PCCM   Procedures:  CT angio chest  ECHO  Antibiotics:  none   HPI/Subjective:  No compliants.  Denies chest pain    Objective: Filed Vitals:   05/04/14 1400 05/04/14 2054 05/05/14 0205 05/05/14 0657  BP: 133/75 119/56 105/49 114/62  Pulse: 75 74 74 83  Temp: 97.1 F (36.2 C) 98.4 F (36.9 C) 98.7 F (37.1 C) 98.5 F (36.9 C)  TempSrc: Oral Oral Oral Oral  Resp: 18 18 18 18   Height:      Weight:    90.402 kg (199 lb 4.8 oz)  SpO2: 99% 95% 96% 98%    Intake/Output Summary (Last 24 hours) at 05/05/14 0901 Last data filed at 05/05/14 0854  Gross per 24 hour  Intake    960 ml  Output   1700 ml  Net   -740 ml   Filed Weights   05/03/14 0605 05/04/14 0614 05/05/14 0657  Weight: 89.903 kg (198 lb 3.2 oz) 90.447 kg (199 lb 6.4 oz)  90.402 kg (199 lb 4.8 oz)    Exam:   General:  Adult female, No acute distress, sitting up in chair, smiling  HEENT:  NCAT, MMM  Cardiovascular:  RRR, nl S1, S2, possible mid-systolic murmur, 2+ pulses, warm extremities  Respiratory:  CTAB, no increased WOB  Abdomen:   NABS, soft, NT/ND  MSK:   Normal tone and bulk, no LEE  Neuro:  Grossly intact  Data Reviewed: Basic Metabolic Panel:  Recent Labs Lab 05/01/14 1424 05/01/14 2152 05/02/14 0420 05/03/14 0530 05/04/14 0334 05/05/14 0408  NA 142  --  139 139 136 135  K 4.0  --  3.2* 3.5 3.8 4.0  CL 108  --  105 105 102 104  CO2 27  --  25 24 27 23   GLUCOSE 97  --  101* 101* 105* 98  BUN 22  --  18 19 24* 26*  CREATININE 1.34* 1.26* 1.18*  1.31* 1.45* 1.51*  CALCIUM 9.7  --  9.5 9.6 9.7 9.5  MG  --   --  1.6  --   --   --   PHOS  --   --  3.2  --   --   --    Liver Function Tests:  Recent Labs Lab 05/04/14 0334  AST 23  ALT 17  ALKPHOS 94  BILITOT 0.7  PROT 6.2  ALBUMIN 3.4*   No results for input(s): LIPASE, AMYLASE in the last 168 hours. No results for input(s): AMMONIA in the last 168 hours. CBC:  Recent Labs Lab 05/01/14 1424 05/01/14 2152 05/02/14 0420 05/03/14 0530  WBC 4.5 5.0 5.4 4.0  HGB 14.0 15.1* 13.9 13.8  HCT 41.7 44.5 42.0 42.1  MCV 83.7 82.7 83.5 83.2  PLT 201 195 198 209   Cardiac Enzymes:  Recent Labs Lab 05/01/14 2152 05/02/14 0420 05/02/14 0947  TROPONINI 0.03 0.03 0.03   BNP (last 3 results)  Recent Labs  05/01/14 1424  BNP 433.8*    ProBNP (last 3 results) No results for input(s): PROBNP in the last 8760 hours.  CBG: No results for input(s): GLUCAP in the last 168 hours.  Recent Results (from the past 240 hour(s))  MRSA PCR Screening     Status: None   Collection Time: 05/01/14 10:30 PM  Result Value Ref Range Status   MRSA by PCR NEGATIVE NEGATIVE Final    Comment:        The GeneXpert MRSA Assay (FDA approved for NASAL specimens only), is one component of a comprehensive MRSA colonization surveillance program. It is not intended to diagnose MRSA infection nor to guide or monitor treatment for MRSA infections.      Studies: No results found.  Scheduled Meds: . allopurinol  100 mg Oral Daily  . amLODipine  5 mg Oral Daily  . aspirin EC  81 mg Oral Daily  . atorvastatin  80 mg Oral q1800  . furosemide  20 mg Oral Daily  . heparin  5,000 Units Subcutaneous 3 times per day  . isosorbide-hydrALAZINE  2 tablet Oral TID  . nebivolol  5 mg Oral Daily  . pantoprazole  40 mg Oral Daily   Continuous Infusions: . [START ON 05/06/2014] sodium chloride      Principal Problem:   Acute on chronic combined systolic and diastolic heart failure Active  Problems:   CKD (chronic kidney disease), stage III   Hx of stroke without residual deficits   Hypertensive emergency   Congestive heart disease  Time spent: 30 min    Tara Dunn, [place] [facility] Pager [phone]. If 7PM-7AM, please contact night-coverage at [email or web address], password [facility] 05/05/2014, 9:01 AM  LOS: 4 days

## 2014-05-05 NOTE — Progress Notes (Signed)
Physical Therapy Treatment Patient Details Name: Tara Dunn MRN: [id number] DOB: [birth year] Today's Date: 27-May-2014    History of Present Illness 74 y.o. F brought to [facility] ED 3/23 with SOB due to hypertensive urgency. PMHx: HTN and breast cancer, CVA, gout, CHF, CKD    PT Comments    Patient in chair visiting family.  Provided patient with handout of exercise program.  Patient able to perform with min cues.  Instructed to perform 2x/day.  Will f/u following cardiac cath.  Follow Up Recommendations  Home health PT;Supervision - Intermittent     Equipment Recommendations  Rolling walker with 5" wheels    Recommendations for Other Services       Precautions / Restrictions Precautions Precautions: Fall Precaution Comments: Pt reports that she has fallen 2 x in the last 4 months.  Was not using AD during falls Restrictions Weight Bearing Restrictions: No    Mobility  Bed Mobility                  Transfers                    Ambulation/Gait                 Stairs            Wheelchair Mobility    Modified Rankin (Stroke Patients Only)       Balance                                    Cognition Arousal/Alertness: Awake/alert Behavior During Therapy: WFL for tasks assessed/performed Overall Cognitive Status: Within Functional Limits for tasks assessed                      Exercises General Exercises - Lower Extremity Long Arc Quad: AROM;Both;10 reps;Seated Hip Flexion/Marching: AROM;Both;10 reps;Seated Toe Raises: AROM;Both;10 reps;Seated Heel Raises: AROM;Both;10 reps;Seated    General Comments        Pertinent Vitals/Pain Pain Assessment: No/denies pain    Home Living                      Prior Function            PT Goals (current goals can now be found in the care plan section) Progress towards PT goals: Progressing toward goals    Frequency  Min 3X/week    PT Plan Current  plan remains appropriate    Co-evaluation             End of Session   Activity Tolerance: Patient limited by fatigue Patient left: in chair;with call bell/phone within reach;with family/visitor present     Time: 2094-7096 PT Time Calculation (min) (ACUTE ONLY): 10 min  Charges:  $Therapeutic Exercise: 8-22 mins                    G Codes:      Despina Pole 05-27-14, 7:27 PM Carita Pian. Sanjuana Kava, [place] Pager [phone]

## 2014-05-05 NOTE — Progress Notes (Addendum)
Patient Name: Tara Dunn Date of Encounter: 05/05/2014     Principal Problem:   Acute on chronic combined systolic and diastolic heart failure Active Problems:   CKD (chronic kidney disease), stage III   Hx of stroke without residual deficits   Hypertensive emergency   Congestive heart disease    SUBJECTIVE  Breathing much improved. No CP. No complaints this AM. Awaiting LHC tomorrow.   CURRENT MEDS . allopurinol  100 mg Oral Daily  . amLODipine  5 mg Oral Daily  . aspirin EC  81 mg Oral Daily  . atorvastatin  80 mg Oral q1800  . furosemide  20 mg Oral Daily  . heparin  5,000 Units Subcutaneous 3 times per day  . isosorbide-hydrALAZINE  2 tablet Oral TID  . nebivolol  5 mg Oral Daily  . pantoprazole  40 mg Oral Daily    OBJECTIVE  Filed Vitals:   05/04/14 1400 05/04/14 2054 05/05/14 0205 05/05/14 0657  BP: 133/75 119/56 105/49 114/62  Pulse: 75 74 74 83  Temp: 97.1 F (36.2 C) 98.4 F (36.9 C) 98.7 F (37.1 C) 98.5 F (36.9 C)  TempSrc: Oral Oral Oral Oral  Resp: 18 18 18 18   Height:      Weight:    199 lb 4.8 oz (90.402 kg)  SpO2: 99% 95% 96% 98%    Intake/Output Summary (Last 24 hours) at 05/05/14 0951 Last data filed at 05/05/14 0854  Gross per 24 hour  Intake    960 ml  Output   1700 ml  Net   -740 ml   Filed Weights   05/03/14 0605 05/04/14 0614 05/05/14 0657  Weight: 198 lb 3.2 oz (89.903 kg) 199 lb 6.4 oz (90.447 kg) 199 lb 4.8 oz (90.402 kg)    PHYSICAL EXAM  General: Pleasant, NAD. Neuro: Alert and oriented X 3. Moves all extremities spontaneously. Psych: Normal affect. HEENT:  Normal  Neck: Supple without bruits or JVD. Lungs:  Resp regular and unlabored, CTA. Heart: RRR no s3, s4, or murmurs. Abdomen: Soft, non-tender, non-distended, BS + x 4.  Extremities: No clubbing, cyanosis or edema. DP/PT/Radials 2+ and equal bilaterally.  Accessory Clinical Findings  CBC  Recent Labs  05/03/14 0530  WBC 4.0  HGB 13.8  HCT 42.1   MCV 83.2  PLT 242   Basic Metabolic Panel  Recent Labs  05/04/14 0334 05/05/14 0408  NA 136 135  K 3.8 4.0  CL 102 104  CO2 27 23  GLUCOSE 105* 98  BUN 24* 26*  CREATININE 1.45* 1.51*  CALCIUM 9.7 9.5   Liver Function Tests  Recent Labs  05/04/14 0334  AST 23  ALT 17  ALKPHOS 94  BILITOT 0.7  PROT 6.2  ALBUMIN 3.4*    Fasting Lipid Panel  Recent Labs  05/04/14 0334  CHOL 142  HDL 48  LDLCALC 70  TRIG 119  CHOLHDL 3.0   TELE  NSR with some sinus brady.  Radiology/Studies  Dg Chest 2 View  05/01/2014   CLINICAL DATA:  Shortness of breath beginning yesterday, former smoker, LEFT breast cancer post mastectomy in 2009, hypertension  EXAM: CHEST  2 VIEW  COMPARISON:  None  FINDINGS: Enlargement of cardiac silhouette.  Mild elongation of thoracic aorta with atherosclerotic calcification.  Bronchitic changes with subsegmental atelectasis at RIGHT base.  No definite acute infiltrate, pleural effusion or pneumothorax.  Bones demineralized.  IMPRESSION: Enlargement of cardiac silhouette.  Bronchitic changes with subsegmental atelectasis RIGHT base.   Electronically  Signed   By: Lavonia Dana M.D.   On: 05/01/2014 15:33   Ct Angio Chest Pe W/cm &/or Wo Cm  05/01/2014   CLINICAL DATA:  Short of breath.  Cough.  Fever.  Chills.  EXAM: CT ANGIOGRAPHY CHEST WITH CONTRAST  TECHNIQUE: Multidetector CT imaging of the chest was performed using the standard protocol during bolus administration of intravenous contrast. Multiplanar CT image reconstructions and MIPs were obtained to evaluate the vascular anatomy.  CONTRAST:  100 mL Omnipaque 300.  COMPARISON:  Chest radiograph 05/01/2014.  FINDINGS: Bones: No aggressive osseous lesions. Age expected thoracic and lower cervical spondylosis.  Cardiovascular: Technically adequate study. No pulmonary embolism. No gross acute aortic abnormality.  Distal aortic arch measures 3.9 cm. Recommend annual imaging followup by CTA or MRA. This  recommendation follows 2010 ACCF/AHA/AATS/ACR/ASA/SCA/SCAI/SIR/STS/SVM Guidelines for the Diagnosis and Management of Patients with Thoracic Aortic Disease. Circulation.2010; 121: Z025-E527  Lungs: Dependent atelectasis is present. There is airspace disease in the dependent portions of both upper lobes and both lower lobes. This is superimposed on the atelectasis. Interlobular septal thickening is present at the apices. No focal airspace consolidation to suggest pneumonia or aspiration.  Central airways: Patent.  Effusions: Small pleural effusions bilaterally. No pericardial effusion.  Lymphadenopathy: LEFT mastectomy and axillary dissection with scarring in the LEFT axilla. No RIGHT axillary adenopathy. No mediastinal or hilar adenopathy. Calcified RIGHT hilar lymph nodes consistent with old granulomatous disease.  Esophagus: Normal.  Upper abdomen: Atherosclerosis.  Other: None.  Review of the MIP images confirms the above findings.  IMPRESSION: 1. Negative for pulmonary embolism. 2. Constellation of findings compatible with mild to moderate CHF. Small bilateral pleural effusions and mild alveolar pulmonary edema along with interstitial edema. 3. 39 mm distal aortic arch. Recommend annual imaging followup by CTA or MRA. This recommendation follows 2010 ACCF/AHA/AATS/ACR/ASA/SCA/SCAI/SIR/STS/SVM Guidelines for the Diagnosis and Management of Patients with Thoracic Aortic Disease. Circulation.2010; 121: P824-M353   Electronically Signed   By: Dereck Ligas M.D.   On: 05/01/2014 20:00   Dg Chest Port 1 View  05/02/2014   CLINICAL DATA:  Edema.  Shortness of breath.  Cough.  EXAM: PORTABLE CHEST - 1 VIEW  COMPARISON:  CT 03/ 23/2016.  Chest x-ray 05/01/2014 .  FINDINGS: Stable thoracic aortic ectasia. Reference made to prior CT report. Cardiomegaly with mild pulmonary interstitial prominence again noted. These findings are again consistent with congestive heart failure. Tiny pleural effusions cannot be excluded.  No pneumothorax. No acute osseus abnormality.  IMPRESSION: 1. Thoracic aortic ectasia again noted. Reference is made to prior CT report of 05/01/2014. Aortic contour is stable. 2. Mild congestive heart failure with mild interstitial edema. No significant interim change. Tiny bilateral pleural effusions cannot be excluded.   Electronically Signed   By: Marcello Moores  Register   On: 05/02/2014 07:30    ASSESSMENT AND PLAN  Tara Dunn is a 74 y.o. F with PMH of HTN and breast CA who presented to [facility] ED 3/23 with SOB. Symptoms were present for past 2 months, but worsened over the 2 - 3 days prior to admission. She saw her PCP and was found to be hypertensive so she was sent to the ER. In ED, BP noted to be 205 / 145.   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 Net neg 1.3L  - EF 25-30%, possible hypokinesis of the anteroseptal myocardium. Left atrium with mod to severe dilation.  - On BB, hydral/nitrate - No ACEI or spiro due to kidney function - Continue lasix 20mg  po qd. Considering holding with slightly elevated creat and LHC planned tomorrow. 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   Distal thoracic aorta arch enlargement. - will need repeat CT chest in March 2017  CKD stage III, creatinine bumped to 1.51 today. Consider holding lasix today prior to [facility] tomorrow.  - minimize nephrotoxins  Hx of Gout, stable - allopurinol  Hx of  CVA   Signed, Eileen Stanford PA-C  Pager [phone]  I have seen and examined the patient along with Angelena Form R PA-C.  I have reviewed the chart, notes and new data.  I agree with PA's note.  PLAN: High level of suspicion for multivessel CAD as cause of cardiomyopathy, with HTN and prior chemotherapy as less likely alternatives. Discussed tomorrow's cath procedure. This procedure has been fully reviewed with the patient and informed consent has been obtained. Hold diuretics and begin IV fluids tonight for mildly worsened renal parameters after diuresis.   Sanda Klein, MD, [place] [phone] 05/05/2014, 1:13 PM

## 2014-05-06 DIAGNOSIS — N183 Chronic kidney disease, stage 3 unspecified: Secondary | ICD-10-CM

## 2014-05-06 DIAGNOSIS — N179 Acute kidney failure, unspecified: Secondary | ICD-10-CM

## 2014-05-06 LAB — HEMOGLOBIN A1C
Hgb A1c MFr Bld: 6 % — ABNORMAL HIGH (ref 4.8–5.6)
Mean Plasma Glucose: 126 mg/dL

## 2014-05-06 LAB — CBC
HCT: 39.9 % (ref 36.0–46.0)
HEMOGLOBIN: 12.9 g/dL (ref 12.0–15.0)
MCH: 27.3 pg (ref 26.0–34.0)
MCHC: 32.3 g/dL (ref 30.0–36.0)
MCV: 84.5 fL (ref 78.0–100.0)
PLATELETS: 216 10*3/uL (ref 150–400)
RBC: 4.72 MIL/uL (ref 3.87–5.11)
RDW: 15.7 % — ABNORMAL HIGH (ref 11.5–15.5)
WBC: 5.7 10*3/uL (ref 4.0–10.5)

## 2014-05-06 LAB — BASIC METABOLIC PANEL
ANION GAP: 10 (ref 5–15)
BUN: 26 mg/dL — ABNORMAL HIGH (ref 6–23)
CALCIUM: 9.8 mg/dL (ref 8.4–10.5)
CO2: 25 mmol/L (ref 19–32)
Chloride: 103 mmol/L (ref 96–112)
Creatinine, Ser: 1.6 mg/dL — ABNORMAL HIGH (ref 0.50–1.10)
GFR calc Af Amer: 36 mL/min — ABNORMAL LOW (ref >90)
GFR, EST NON AFRICAN AMERICAN: 31 mL/min — AB (ref >90)
Glucose, Bld: 101 mg/dL — ABNORMAL HIGH (ref 70–99)
Potassium: 3.8 mmol/L (ref 3.5–5.1)
Sodium: 138 mmol/L (ref 135–145)

## 2014-05-06 LAB — PROTIME-INR
INR: 0.95 (ref 0.00–1.49)
Prothrombin Time: 12.8 seconds (ref 11.6–15.2)

## 2014-05-06 MED ORDER — SODIUM CHLORIDE 0.9 % IV SOLN: 250.0000 mL | INTRAVENOUS | Status: DC | PRN

## 2014-05-06 MED ORDER — ZOLPIDEM TARTRATE 5 MG PO TABS: 5.0000 mg | ORAL_TABLET | Freq: Every evening | ORAL | Status: DC | PRN

## 2014-05-06 MED ORDER — SODIUM CHLORIDE 0.9 % IJ SOLN
3.0000 mL | Freq: Two times a day (BID) | INTRAMUSCULAR | Status: DC
  Administered 2014-05-06 – 2014-05-07 (×3): 3 mL via INTRAVENOUS

## 2014-05-06 MED ORDER — SODIUM CHLORIDE 0.9 % IJ SOLN: 3.0000 mL | INTRAMUSCULAR | Status: DC | PRN

## 2014-05-06 MED ORDER — SODIUM CHLORIDE 0.9 % IV SOLN
INTRAVENOUS | Status: DC
  Administered 2014-05-06 – 2014-05-07 (×3): via INTRAVENOUS

## 2014-05-06 NOTE — Progress Notes (Signed)
[facility] PROGRESS NOTE  Tara Dunn EXB:[id number] DOB: [birth year] DOA: 05/01/2014 PCP: No primary care provider on file.  Brief Summary  Tara Dunn is a 74 y.o. F with PMH of HTN and breast CA who presented to [facility] ED 3/23 with SOB. Symptoms were present for past 2 months, but worsened over the 2 - 3 days prior to admission. She saw her PCP and was found to be hypertensive so she was sent to the ER.  In ED, BP noted to be 205 / 145.  She takes antihypertensives at home and stated that she did not take them on 3/22 but otherwise had not missed any doses. She was started on nitroglycerin infusion and was given lasix and hydralazine. PCCM was consulted for admission to ICU for nicardipine gtt.  Her blood pressures trended down with diuresis, norvasc, bystolic, and HCTZ.  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  ECHO revealed moderate concentric hypertrophy with EF 25-30% and possible hypokinesis of the anteroseptal myocardium.  Left atrium with mod to severe dilation.  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   -  Weight and I/O stable -  EF 25-30%, possible hypokinesis of the anteroseptal myocardium.  Left atrium with mod to severe dilation.   -  On BB, hydral/nitrate -   No ACEI or spiro due to kidney function -  Holding lasix due to AKI  Distal thoracic aorta arch enlargement. - will need repeat CT chest in March 2017  Hx of Gout, stable, continue allopurinol  Hx of CVA, recommend ASA 325 mg daily for secondary prevention of stroke  Diet:  Healthy heart  Access:  PIV IVF:  off Proph:  heparin  Code Status: full Family Communication: patient alone Disposition Plan:  PT/OT recommending HH PT.     Consultants:  Cardiology  PCCM   Procedures:  CT angio chest  ECHO  Antibiotics:  none   HPI/Subjective:  No compliants.  Denies chest pain.  Did not sleep well last night.    Objective: Filed Vitals:   05/05/14 1300 05/05/14 2049 05/06/14 0513 05/06/14 1318  BP: 126/63 112/79 121/72 126/62  Pulse: 80 77 81 82  Temp: 98.7 F (37.1 C) 98.7 F (37.1 C) 98.4 F (36.9 C) 98 F (36.7 C)  TempSrc: Oral Oral Oral Oral  Resp: 18 17 18 20   Height:      Weight:   91.173 kg (201 lb)   SpO2: 99% 94% 97% 99%    Intake/Output Summary (Last 24 hours) at 05/06/14 1427 Last data filed at 05/06/14 1318  Gross per 24 hour  Intake    360 ml  Output    700 ml  Net   -340 ml   Filed Weights   05/04/14 0614 05/05/14 1027  05/06/14 0513  Weight: 90.447 kg (199 lb 6.4 oz) 90.402 kg (199 lb 4.8 oz) 91.173 kg (201 lb)    Exam:   General:  Adult female, No acute distress, sitting up in chair, PIV in right hand   HEENT:  NCAT, MMM  Cardiovascular:  RRR, nl S1, S2, 2/6 systolic murmur, 2+ pulses, warm extremities  Respiratory:  CTAB, no increased WOB  Abdomen:   NABS, soft, NT/ND  MSK:   Normal tone and bulk, no LEE  Neuro:  Grossly intact  Data Reviewed: Basic Metabolic Panel:  Recent Labs Lab 05/02/14 0420 05/03/14 0530 05/04/14 0334 05/05/14 0408 05/06/14 0411  NA 139 139 136 135 138  K 3.2* 3.5 3.8 4.0 3.8  CL 105 105 102 104 103  CO2 25 24 27 23 25   GLUCOSE 101* 101* 105* 98 101*  BUN 18 19 24* 26* 26*  CREATININE  1.18* 1.31* 1.45* 1.51* 1.60*  CALCIUM 9.5 9.6 9.7 9.5 9.8  MG 1.6  --   --   --   --   PHOS 3.2  --   --   --   --    Liver Function Tests:  Recent Labs Lab 05/04/14 0334  AST 23  ALT 17  ALKPHOS 94  BILITOT 0.7  PROT 6.2  ALBUMIN 3.4*   No results for input(s): LIPASE, AMYLASE in the last 168 hours. No results for input(s): AMMONIA in the last 168 hours. CBC:  Recent Labs Lab 05/01/14 1424 05/01/14 2152 05/02/14 0420 05/03/14 0530 05/06/14 0411  WBC 4.5 5.0 5.4 4.0 5.7  HGB 14.0 15.1* 13.9 13.8 12.9  HCT 41.7 44.5 42.0 42.1 39.9  MCV 83.7 82.7 83.5 83.2 84.5  PLT 201 195 198 209 216   Cardiac Enzymes:  Recent Labs Lab 05/01/14 2152 05/02/14 0420 05/02/14 0947  TROPONINI 0.03 0.03 0.03   BNP (last 3 results)  Recent Labs  05/01/14 1424  BNP 433.8*    ProBNP (last 3 results) No results for input(s): PROBNP in the last 8760 hours.  CBG: No results for input(s): GLUCAP in the last 168 hours.  Recent Results (from the past 240 hour(s))  MRSA PCR Screening     Status: None   Collection Time: 05/01/14 10:30 PM  Result Value Ref Range Status   MRSA by PCR NEGATIVE NEGATIVE Final    Comment:        The GeneXpert MRSA Assay (FDA approved for NASAL specimens only), is one component of a comprehensive MRSA colonization surveillance program. It is not intended to diagnose MRSA infection nor to guide or monitor treatment for MRSA infections.      Studies: No results found.  Scheduled Meds: . allopurinol  100 mg Oral Daily  . amLODipine  5 mg Oral Daily  . aspirin EC  81 mg Oral Daily  . atorvastatin  80 mg Oral q1800  . heparin  5,000 Units Subcutaneous 3 times per day  . isosorbide-hydrALAZINE  2 tablet Oral TID  . nebivolol  5 mg Oral Daily  . pantoprazole  40 mg Oral Daily  . sodium chloride  3 mL Intravenous Q12H   Continuous Infusions: . sodium chloride 100 mL/hr at 05/06/14 0930    Principal Problem:   Acute on chronic combined  systolic and diastolic heart failure Active Problems:   CKD (chronic kidney disease), stage III   Hx of stroke without residual deficits   Hypertensive emergency   Congestive heart disease    Time spent: 30 min  Tara Dunn  [facility] Pager [phone]. If 7PM-7AM, please contact night-coverage at [email or web address], password [facility] 05/06/2014, 2:27 PM  LOS: 5 days

## 2014-05-06 NOTE — Progress Notes (Signed)
Patient Name: Tara Dunn Date of Encounter: 05/06/2014     Principal Problem:   Acute on chronic combined systolic and diastolic heart failure Active Problems:   CKD (chronic kidney disease), stage III   Hx of stroke without residual deficits   Hypertensive emergency   Congestive heart disease    SUBJECTIVE  Breathing got better. Denies any CP, did have CP before hospital arrival. Unfortunately her creatinine continues to climb.  CURRENT MEDS . allopurinol  100 mg Oral Daily  . amLODipine  5 mg Oral Daily  . aspirin EC  81 mg Oral Daily  . atorvastatin  80 mg Oral q1800  . heparin  5,000 Units Subcutaneous 3 times per day  . isosorbide-hydrALAZINE  2 tablet Oral TID  . nebivolol  5 mg Oral Daily  . pantoprazole  40 mg Oral Daily  . sodium chloride  3 mL Intravenous Q12H    OBJECTIVE  Filed Vitals:   05/05/14 1016 05/05/14 1300 05/05/14 2049 05/06/14 0513  BP: 135/73 126/63 112/79 121/72  Pulse:  80 77 81  Temp:  98.7 F (37.1 C) 98.7 F (37.1 C) 98.4 F (36.9 C)  TempSrc:  Oral Oral Oral  Resp:  18 17 18   Height:      Weight:    201 lb (91.173 kg)  SpO2:  99% 94% 97%    Intake/Output Summary (Last 24 hours) at 05/06/14 0915 Last data filed at 05/06/14 0600  Gross per 24 hour  Intake    360 ml  Output    600 ml  Net   -240 ml   Filed Weights   05/04/14 0614 05/05/14 0657 05/06/14 0513  Weight: 199 lb 6.4 oz (90.447 kg) 199 lb 4.8 oz (90.402 kg) 201 lb (91.173 kg)    PHYSICAL EXAM  General: Pleasant, NAD. Neuro: Alert and oriented X 3. Moves all extremities spontaneously. Psych: Normal affect. HEENT:  Normal  Neck: Supple without bruits or JVD. Lungs:  Resp regular and unlabored, CTA. Heart: RRR no s3, s4. 1/6 systolic murmur Abdomen: Soft, non-tender, non-distended, BS + x 4.  Extremities: No clubbing, cyanosis or edema. DP/PT/Radials 2+ and equal bilaterally.  Accessory Clinical Findings  CBC  Recent Labs  05/06/14 0411  WBC 5.7   HGB 12.9  HCT 39.9  MCV 84.5  PLT 193   Basic Metabolic Panel  Recent Labs  05/05/14 0408 05/06/14 0411  NA 135 138  K 4.0 3.8  CL 104 103  CO2 23 25  GLUCOSE 98 101*  BUN 26* 26*  CREATININE 1.51* 1.60*  CALCIUM 9.5 9.8   Liver Function Tests  Recent Labs  05/04/14 0334  AST 23  ALT 17  ALKPHOS 94  BILITOT 0.7  PROT 6.2  ALBUMIN 3.4*   Fasting Lipid Panel  Recent Labs  05/04/14 0334  CHOL 142  HDL 48  LDLCALC 70  TRIG 119  CHOLHDL 3.0    TELE NSR with HR 70-80s, no significant ventricular ectopy    ECG  No new EKG  Echocardiogram  LV EF: 25% -  30%  Left ventricle: The cavity size was normal. There was moderate concentric hypertrophy. Systolic function was severely reduced. The estimated ejection fraction was in the range of 25% to 30%. Possible hypokinesis of the anteroseptal myocardium. Doppler parameters are consistent with abnormal left ventricular relaxation (grade 1 diastolic dysfunction). - Left atrium: The atrium was moderately to severely dilated.     Radiology/Studies  Dg Chest 2 View  05/01/2014  CLINICAL DATA:  Shortness of breath beginning yesterday, former smoker, LEFT breast cancer post mastectomy in 2009, hypertension  EXAM: CHEST  2 VIEW  COMPARISON:  None  FINDINGS: Enlargement of cardiac silhouette.  Mild elongation of thoracic aorta with atherosclerotic calcification.  Bronchitic changes with subsegmental atelectasis at RIGHT base.  No definite acute infiltrate, pleural effusion or pneumothorax.  Bones demineralized.  IMPRESSION: Enlargement of cardiac silhouette.  Bronchitic changes with subsegmental atelectasis RIGHT base.   Electronically Signed   By: Lavonia Dana M.D.   On: 05/01/2014 15:33   Ct Angio Chest Pe W/cm &/or Wo Cm  05/01/2014   CLINICAL DATA:  Short of breath.  Cough.  Fever.  Chills.  EXAM: CT ANGIOGRAPHY CHEST WITH CONTRAST  TECHNIQUE: Multidetector CT imaging of the chest was performed using  the standard protocol during bolus administration of intravenous contrast. Multiplanar CT image reconstructions and MIPs were obtained to evaluate the vascular anatomy.  CONTRAST:  100 mL Omnipaque 300.  COMPARISON:  Chest radiograph 05/01/2014.  FINDINGS: Bones: No aggressive osseous lesions. Age expected thoracic and lower cervical spondylosis.  Cardiovascular: Technically adequate study. No pulmonary embolism. No gross acute aortic abnormality.  Distal aortic arch measures 3.9 cm. Recommend annual imaging followup by CTA or MRA. This recommendation follows 2010 ACCF/AHA/AATS/ACR/ASA/SCA/SCAI/SIR/STS/SVM Guidelines for the Diagnosis and Management of Patients with Thoracic Aortic Disease. Circulation.2010; 121: I967-E938  Lungs: Dependent atelectasis is present. There is airspace disease in the dependent portions of both upper lobes and both lower lobes. This is superimposed on the atelectasis. Interlobular septal thickening is present at the apices. No focal airspace consolidation to suggest pneumonia or aspiration.  Central airways: Patent.  Effusions: Small pleural effusions bilaterally. No pericardial effusion.  Lymphadenopathy: LEFT mastectomy and axillary dissection with scarring in the LEFT axilla. No RIGHT axillary adenopathy. No mediastinal or hilar adenopathy. Calcified RIGHT hilar lymph nodes consistent with old granulomatous disease.  Esophagus: Normal.  Upper abdomen: Atherosclerosis.  Other: None.  Review of the MIP images confirms the above findings.  IMPRESSION: 1. Negative for pulmonary embolism. 2. Constellation of findings compatible with mild to moderate CHF. Small bilateral pleural effusions and mild alveolar pulmonary edema along with interstitial edema. 3. 39 mm distal aortic arch. Recommend annual imaging followup by CTA or MRA. This recommendation follows 2010 ACCF/AHA/AATS/ACR/ASA/SCA/SCAI/SIR/STS/SVM Guidelines for the Diagnosis and Management of Patients with Thoracic Aortic Disease.  Circulation.2010; 121: B017-P102   Electronically Signed   By: Dereck Ligas M.D.   On: 05/01/2014 20:00   Dg Chest Port 1 View  05/02/2014   CLINICAL DATA:  Edema.  Shortness of breath.  Cough.  EXAM: PORTABLE CHEST - 1 VIEW  COMPARISON:  CT 03/ 23/2016.  Chest x-ray 05/01/2014 .  FINDINGS: Stable thoracic aortic ectasia. Reference made to prior CT report. Cardiomegaly with mild pulmonary interstitial prominence again noted. These findings are again consistent with congestive heart failure. Tiny pleural effusions cannot be excluded. No pneumothorax. No acute osseus abnormality.  IMPRESSION: 1. Thoracic aortic ectasia again noted. Reference is made to prior CT report of 05/01/2014. Aortic contour is stable. 2. Mild congestive heart failure with mild interstitial edema. No significant interim change. Tiny bilateral pleural effusions cannot be excluded.   Electronically Signed   By: Marcello Moores  Register   On: 05/02/2014 07:30    ASSESSMENT AND PLAN  Tara Dunn is a 73 y.o. F with PMH of HTN and breast CA who presented to [facility] ED 3/23 with SOB. Symptoms were present for past 2 months, but  worsened over the 2 - 3 days prior to admission. She saw her PCP and was found to be hypertensive so she was sent to the ER. In ED, BP noted to be 205 / 145.   1. Hypertensive emergency with acute on chronic diastolic heart failure and acute pulm edema  - continue amlodipine, bidil, nebivolol  2. Acute on chronic systolic and diastolic heart failure  3. Probable CAD with evidence of 3v dx on CTA of chest and wall motion abnl on echo  - echo EF 25-30%, possible anteroseptal hypokinesis  - continue ASA, statin  - discussed with Dr. Ron Parker, will defer cath to tomorrow given her worsening renal function. Continue IVF hydration at rate of 73ml/hr given EF 25-30%. Discussed with patient and her family regarding benefit and risk of the procedure include bleeding, vascular/renal injury, arrhythmia, MI, stroke, loss of  life or limb, they displayed clear understanding and agree to proceed.   - ?if patient is better with L&R heart cath instead, currently scheduled for [facility]  - ?does have shellfish allergy, but no documented contrast allergy  4. Distal thoracic arota arch enlargement: will need repeat CT in 1 yr  5. Acute on CKD stage III  6. H/o CVA   Signed, Woodward Ku Pager: [phone] Patient seen and examined. I agree with the assessment and plan as detailed above. See also my additional thoughts below.   The patient's creatinine had increased to 1.6 this morning. This shows a continued small upward trend. Her baseline creatinine has been as low as 1.2. Decision was made to not proceed with her cardiac catheterization today. I spoke with the patient and she understands. IV fluids have been ordered. Unfortunately she did receive Lasix this morning. I've spoken with the nursing staff to be sure that she is getting the IV fluid.  Dola Argyle, MD, [facility] 05/06/2014 1:07 PM

## 2014-05-07 ENCOUNTER — Encounter (HOSPITAL_COMMUNITY): Payer: Self-pay | Admitting: Interventional Cardiology

## 2014-05-07 ENCOUNTER — Encounter (HOSPITAL_COMMUNITY)
Admission: EM | Disposition: A | Discharge status: Home / Self Care | Payer: Self-pay | Source: Home / Self Care | Attending: Pulmonary Disease

## 2014-05-07 DIAGNOSIS — N179 Acute kidney failure, unspecified: Secondary | ICD-10-CM

## 2014-05-07 DIAGNOSIS — I2510 Atherosclerotic heart disease of native coronary artery without angina pectoris: Secondary | ICD-10-CM

## 2014-05-07 HISTORY — PX: LEFT HEART CATHETERIZATION WITH CORONARY ANGIOGRAM: SHX5451

## 2014-05-07 LAB — CBC
HEMATOCRIT: 40.6 % (ref 36.0–46.0)
HEMOGLOBIN: 12.9 g/dL (ref 12.0–15.0)
MCH: 27.2 pg (ref 26.0–34.0)
MCHC: 31.8 g/dL (ref 30.0–36.0)
MCV: 85.5 fL (ref 78.0–100.0)
Platelets: 234 10*3/uL (ref 150–400)
RBC: 4.75 MIL/uL (ref 3.87–5.11)
RDW: 15.9 % — ABNORMAL HIGH (ref 11.5–15.5)
WBC: 4.8 10*3/uL (ref 4.0–10.5)

## 2014-05-07 LAB — BASIC METABOLIC PANEL
ANION GAP: 7 (ref 5–15)
BUN: 23 mg/dL (ref 6–23)
CALCIUM: 9.9 mg/dL (ref 8.4–10.5)
CO2: 24 mmol/L (ref 19–32)
Chloride: 106 mmol/L (ref 96–112)
Creatinine, Ser: 1.48 mg/dL — ABNORMAL HIGH (ref 0.50–1.10)
GFR calc non Af Amer: 34 mL/min — ABNORMAL LOW (ref >90)
GFR, EST AFRICAN AMERICAN: 39 mL/min — AB (ref >90)
Glucose, Bld: 93 mg/dL (ref 70–99)
POTASSIUM: 4.2 mmol/L (ref 3.5–5.1)
Sodium: 137 mmol/L (ref 135–145)

## 2014-05-07 SURGERY — LEFT HEART CATHETERIZATION WITH CORONARY ANGIOGRAM
Anesthesia: LOCAL

## 2014-05-07 MED ORDER — HEPARIN (PORCINE) IN NACL 2-0.9 UNIT/ML-% IJ SOLN
INTRAMUSCULAR | Status: AC
  Filled 2014-05-07: qty 1000

## 2014-05-07 MED ORDER — HEPARIN SODIUM (PORCINE) 5000 UNIT/ML IJ SOLN
5000.0000 [IU] | Freq: Three times a day (TID) | INTRAMUSCULAR | Status: DC
  Administered 2014-05-07 – 2014-05-08 (×3): 5000 [IU] via SUBCUTANEOUS
  Filled 2014-05-07 (×4): qty 1

## 2014-05-07 MED ORDER — LIDOCAINE HCL (PF) 1 % IJ SOLN
INTRAMUSCULAR | Status: AC
  Filled 2014-05-07: qty 30

## 2014-05-07 MED ORDER — ONDANSETRON HCL 4 MG/2ML IJ SOLN: 4.0000 mg | Freq: Four times a day (QID) | INTRAMUSCULAR | Status: DC | PRN

## 2014-05-07 MED ORDER — OXYCODONE-ACETAMINOPHEN 5-325 MG PO TABS: 1.0000 | ORAL_TABLET | ORAL | Status: DC | PRN

## 2014-05-07 MED ORDER — FENTANYL CITRATE 0.05 MG/ML IJ SOLN
INTRAMUSCULAR | Status: AC
  Filled 2014-05-07: qty 2

## 2014-05-07 MED ORDER — VERAPAMIL HCL 2.5 MG/ML IV SOLN
INTRAVENOUS | Status: AC
  Filled 2014-05-07: qty 2

## 2014-05-07 MED ORDER — MIDAZOLAM HCL 2 MG/2ML IJ SOLN
INTRAMUSCULAR | Status: AC
  Filled 2014-05-07: qty 2

## 2014-05-07 MED ORDER — NITROGLYCERIN 1 MG/10 ML FOR IR/CATH LAB
INTRA_ARTERIAL | Status: AC
  Filled 2014-05-07: qty 10

## 2014-05-07 MED ORDER — SODIUM CHLORIDE 0.9 % IV SOLN
INTRAVENOUS | Status: AC
  Administered 2014-05-07: 19:00:00 via INTRAVENOUS

## 2014-05-07 MED ORDER — ACETAMINOPHEN 325 MG PO TABS: 650.0000 mg | ORAL_TABLET | ORAL | Status: DC | PRN

## 2014-05-07 MED ORDER — HEPARIN SODIUM (PORCINE) 1000 UNIT/ML IJ SOLN
INTRAMUSCULAR | Status: AC
  Filled 2014-05-07: qty 1

## 2014-05-07 NOTE — Progress Notes (Signed)
Patient had a short episode of severe bradycardia on telemetry. Noted sinus brady with junctional rhythm, HR 30s, no recurrence since.   Hilbert Corrigan PA Pager: [phone]

## 2014-05-07 NOTE — Progress Notes (Addendum)
[facility] PROGRESS NOTE  Tara Dunn OPF:[id number] DOB: February 11, 1940 DOA: 05/01/2014 PCP: No primary care provider on file.  Brief Summary  Tara Dunn is a 74 y.o. F with PMH of HTN and breast CA who presented to [facility] ED 3/23 with SOB. Symptoms were present for past 2 months, but worsened over the 2 - 3 days prior to admission. She saw her PCP and was found to be hypertensive so she was sent to the ER.  In ED, BP noted to be 205 / 145.  She takes antihypertensives at home and stated that she did not take them on 3/22 but otherwise had not missed any doses. She was started on nitroglycerin infusion and was given lasix and hydralazine. PCCM was consulted for admission to ICU for nicardipine gtt.  Her blood pressures trended down with diuresis, norvasc, bystolic, and HCTZ.  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  ECHO revealed moderate concentric hypertrophy with EF 25-30% and possible hypokinesis of the anteroseptal myocardium.  Left atrium with mod to severe dilation.  Cardiology was consulted for concern for ischemic cardiomyopathy.  She was diuresed and developed some mild AKI which may also have been due to contrast-induced nephropathy from a CT anterior chest a few days prior.  Her catheterization was deferred until 3/29.   She underwent cardiac catheterization on 3/29 which demonstrated moderate three-vessel coronary artery disease with healthy three-vessel calcifications. The proximal to mid RCA 50-70%, proximal to mid circumflex 30-50%, proximal and mid LAD 30-50%. LV demonstrated decreased systolic function and an ejection fraction of 35%.    We will repeat BMP in a.m. to check kidney function, and anticipate medical management including possible initiation of ACE inhibitor. She will need close outpatient follow-up for medication management.  Assessment/Plan  Acute on chronic systolic and diastolic  heart failure, resolved.   -  Weight and I/O stable -  EF 25-30%, possible hypokinesis of the anteroseptal myocardium.  Left atrium with mod to severe dilation.   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 -  Will likely need Lasix for home -  Cardiology recommending possible ACE inhibitor.  If started, she will need close outpatient follow-up for BMP within one week   HTN emergency, BP improved -  Continue BB, norvasc -  Continue bidil to 2 tabs TID - outpt assessment for sleep apnea  Distal thoracic aorta arch enlargement. - will need repeat CT chest in March 2017  Hx of Gout, stable, continue allopurinol  Hx of CVA, recommend ASA 325 mg daily for secondary prevention of stroke  Diet:  Healthy heart  Access:  PIV IVF:  off Proph:  heparin  Code Status: full Family Communication: patient and sisters Disposition Plan:  PT/OT recommending [facility].  Likely home Wednesday if kidney function stable to improved   Consultants:  Cardiology  PCCM  Procedures:  CT angio chest  ECHO  Left heart catheterization on 3/29   Antibiotics:  none   HPI/Subjective:  No compliants.  Denies chest pain, shortness of breath  Objective: Filed Vitals:   05/07/14 0515 05/07/14 1020 05/07/14 1302 05/07/14 1435  BP: 99/54 129/71  157/72  Pulse: 90 73 28 73  Temp: 98.7 F (37.1 C)   97.7 F (36.5 C)  TempSrc: Oral   Oral  Resp: 17 16  18   Height:      Weight: 91.808 kg (202 lb  6.4 oz)     SpO2: 97% 99%  99%    Intake/Output Summary (Last 24 hours) at 05/07/14 1518 Last data filed at 05/07/14 1434  Gross per 24 hour  Intake    100 ml  Output    425 ml  Net   -325 ml   Filed Weights   05/05/14 0657 05/06/14 0513 05/07/14 0515  Weight: 90.402 kg (199 lb 4.8 oz) 91.173 kg (201 lb) 91.808 kg (202 lb 6.4 oz)    Exam:   General:  Adult female, No acute distress, lying in bed  HEENT:  NCAT, MMM  Cardiovascular:  RRR, nl S1, S2, 2/6 systolic murmur, 2+ pulses, warm extremities  Respiratory:  CTAB, no increased WOB  Abdomen:   NABS, soft, NT/ND  MSK:   Normal tone and bulk, trace bilateral pitting LEE.  Right wrist with compression dressing, < 2sec CR  Neuro:  Grossly intact  Data Reviewed: Basic Metabolic Panel:  Recent Labs Lab 05/02/14 0420 05/03/14 0530 05/04/14 0334 05/05/14 0408 05/06/14 0411 05/07/14 0354  NA 139 139 136 135 138 137  K 3.2* 3.5 3.8 4.0 3.8 4.2  CL 105 105 102 104 103 106  CO2 25 24 27 23 25 24   GLUCOSE 101* 101* 105* 98 101* 93  BUN 18 19 24* 26* 26* 23  CREATININE 1.18* 1.31* 1.45* 1.51* 1.60* 1.48*  CALCIUM 9.5 9.6 9.7 9.5 9.8 9.9  MG 1.6  --   --   --   --   --   PHOS 3.2  --   --   --   --   --    Liver Function Tests:  Recent Labs Lab 05/04/14 0334  AST 23  ALT 17  ALKPHOS 94  BILITOT 0.7  PROT 6.2  ALBUMIN 3.4*   No results for input(s): LIPASE, AMYLASE in the last 168 hours. No results for input(s): AMMONIA in the last 168 hours. CBC:  Recent Labs Lab 05/01/14 2152 05/02/14 0420 05/03/14 0530 05/06/14 0411 05/07/14 0354  WBC 5.0 5.4 4.0 5.7 4.8  HGB 15.1* 13.9 13.8 12.9 12.9  HCT 44.5 42.0 42.1 39.9 40.6  MCV 82.7 83.5 83.2 84.5 85.5  PLT 195 198 209 216 234   Cardiac Enzymes:  Recent Labs Lab 05/01/14 2152 05/02/14 0420 05/02/14 0947  TROPONINI 0.03 0.03 0.03   BNP (last 3 results)  Recent Labs  05/01/14 1424  BNP 433.8*    ProBNP (last 3 results) No results for  input(s): PROBNP in the last 8760 hours.  CBG: No results for input(s): GLUCAP in the last 168 hours.  Recent Results (from the past 240 hour(s))  MRSA PCR Screening     Status: None   Collection Time: 05/01/14 10:30 PM  Result Value Ref Range Status   MRSA by PCR NEGATIVE NEGATIVE Final    Comment:        The GeneXpert MRSA Assay (FDA approved for NASAL specimens only),  is one component of a comprehensive MRSA colonization surveillance program. It is not intended to diagnose MRSA infection nor to guide or monitor treatment for MRSA infections.      Studies: No results found.  Scheduled Meds: . allopurinol  100 mg Oral Daily  . amLODipine  5 mg Oral Daily  . aspirin EC  81 mg Oral Daily  . atorvastatin  80 mg Oral q1800  . heparin  5,000 Units Subcutaneous 3 times per day  . isosorbide-hydrALAZINE  2 tablet Oral TID  . nebivolol  5 mg Oral Daily  . pantoprazole  40 mg Oral Daily   Continuous Infusions: . sodium chloride      Principal Problem:   Acute on chronic combined systolic and diastolic heart failure Active Problems:   CKD (chronic kidney disease), stage III   Hx of stroke without residual deficits   Hypertensive emergency   Congestive heart disease   Acute renal failure superimposed on stage 3 chronic kidney disease    Time spent: 30 min    Sufyan Meidinger, [facility] Pager [phone]. If 7PM-7AM, please contact night-coverage at [email or web address], password [facility] 05/07/2014, 3:18 PM  LOS: 6 days

## 2014-05-07 NOTE — H&P (View-Only) (Signed)
Patient Name: Francisco Fait Date of Encounter: 05/07/2014  Primary Cardiologist: New- Dr. Haroldine Laws   Principal Problem:   Acute on chronic combined systolic and diastolic heart failure Active Problems:   CKD (chronic kidney disease), stage III   Hx of stroke without residual deficits   Hypertensive emergency   Congestive heart disease   Acute renal failure superimposed on stage 3 chronic kidney disease    SUBJECTIVE  Denies any CP or SOB.   CURRENT MEDS . allopurinol  100 mg Oral Daily  . amLODipine  5 mg Oral Daily  . aspirin EC  81 mg Oral Daily  . atorvastatin  80 mg Oral q1800  . heparin  5,000 Units Subcutaneous 3 times per day  . isosorbide-hydrALAZINE  2 tablet Oral TID  . nebivolol  5 mg Oral Daily  . pantoprazole  40 mg Oral Daily  . sodium chloride  3 mL Intravenous Q12H    OBJECTIVE  Filed Vitals:   05/06/14 1850 05/06/14 2115 05/07/14 0515 05/07/14 1020  BP: 145/79 135/61 99/54 129/71  Pulse: 74 75 90 73  Temp: 97.8 F (36.6 C) 98.5 F (36.9 C) 98.7 F (37.1 C)   TempSrc: Oral Oral Oral   Resp: 18 18 17 16   Height:      Weight:   202 lb 6.4 oz (91.808 kg)   SpO2: 98% 94% 97% 99%    Intake/Output Summary (Last 24 hours) at 05/07/14 1039 Last data filed at 05/07/14 0514  Gross per 24 hour  Intake    240 ml  Output    275 ml  Net    -35 ml   Filed Weights   05/05/14 0657 05/06/14 0513 05/07/14 0515  Weight: 199 lb 4.8 oz (90.402 kg) 201 lb (91.173 kg) 202 lb 6.4 oz (91.808 kg)    PHYSICAL EXAM  General: Pleasant, NAD. Neuro: Alert and oriented X 3. Moves all extremities spontaneously. Psych: Normal affect. HEENT:  Normal  Neck: Supple without bruits or JVD. Lungs:  Resp regular and unlabored, CTA. Heart: RRR no s3, s4. 1/6 systolic murmur Abdomen: Soft, non-tender, non-distended, BS + x 4.  Extremities: No clubbing, cyanosis or edema. DP/PT/Radials 2+ and equal bilaterally.  Accessory Clinical Findings  CBC  Recent Labs  05/06/14 0411 05/07/14 0354  WBC 5.7 4.8  HGB 12.9 12.9  HCT 39.9 40.6  MCV 84.5 85.5  PLT 216 193   Basic Metabolic Panel  Recent Labs  05/06/14 0411 05/07/14 0354  NA 138 137  K 3.8 4.2  CL 103 106  CO2 25 24  GLUCOSE 101* 93  BUN 26* 23  CREATININE 1.60* 1.48*  CALCIUM 9.8 9.9    TELE NSR with HR 70-80s, no significant ventricular ectopy    ECG  No new EKG  Echocardiogram 05/02/2014  LV EF: 25% -  30%  Left ventricle: The cavity size was normal. There was moderate concentric hypertrophy. Systolic function was severely reduced. The estimated ejection fraction was in the range of 25% to 30%. Possible hypokinesis of the anteroseptal myocardium. Doppler parameters are consistent with abnormal left ventricular relaxation (grade 1 diastolic dysfunction). - Left atrium: The atrium was moderately to severely dilated.     Radiology/Studies  Dg Chest 2 View  05/01/2014   CLINICAL DATA:  Shortness of breath beginning yesterday, former smoker, LEFT breast cancer post mastectomy in 2009, hypertension  EXAM: CHEST  2 VIEW  COMPARISON:  None  FINDINGS: Enlargement of cardiac silhouette.  Mild elongation of thoracic aorta  with atherosclerotic calcification.  Bronchitic changes with subsegmental atelectasis at RIGHT base.  No definite acute infiltrate, pleural effusion or pneumothorax.  Bones demineralized.  IMPRESSION: Enlargement of cardiac silhouette.  Bronchitic changes with subsegmental atelectasis RIGHT base.   Electronically Signed   By: Lavonia Dana M.D.   On: 05/01/2014 15:33   Ct Angio Chest Pe W/cm &/or Wo Cm  05/01/2014   CLINICAL DATA:  Short of breath.  Cough.  Fever.  Chills.  EXAM: CT ANGIOGRAPHY CHEST WITH CONTRAST  TECHNIQUE: Multidetector CT imaging of the chest was performed using the standard protocol during bolus administration of intravenous contrast. Multiplanar CT image reconstructions and MIPs were obtained to evaluate the vascular anatomy.   CONTRAST:  100 mL Omnipaque 300.  COMPARISON:  Chest radiograph 05/01/2014.  FINDINGS: Bones: No aggressive osseous lesions. Age expected thoracic and lower cervical spondylosis.  Cardiovascular: Technically adequate study. No pulmonary embolism. No gross acute aortic abnormality.  Distal aortic arch measures 3.9 cm. Recommend annual imaging followup by CTA or MRA. This recommendation follows 2010 ACCF/AHA/AATS/ACR/ASA/SCA/SCAI/SIR/STS/SVM Guidelines for the Diagnosis and Management of Patients with Thoracic Aortic Disease. Circulation.2010; 121: V784-O962  Lungs: Dependent atelectasis is present. There is airspace disease in the dependent portions of both upper lobes and both lower lobes. This is superimposed on the atelectasis. Interlobular septal thickening is present at the apices. No focal airspace consolidation to suggest pneumonia or aspiration.  Central airways: Patent.  Effusions: Small pleural effusions bilaterally. No pericardial effusion.  Lymphadenopathy: LEFT mastectomy and axillary dissection with scarring in the LEFT axilla. No RIGHT axillary adenopathy. No mediastinal or hilar adenopathy. Calcified RIGHT hilar lymph nodes consistent with old granulomatous disease.  Esophagus: Normal.  Upper abdomen: Atherosclerosis.  Other: None.  Review of the MIP images confirms the above findings.  IMPRESSION: 1. Negative for pulmonary embolism. 2. Constellation of findings compatible with mild to moderate CHF. Small bilateral pleural effusions and mild alveolar pulmonary edema along with interstitial edema. 3. 39 mm distal aortic arch. Recommend annual imaging followup by CTA or MRA. This recommendation follows 2010 ACCF/AHA/AATS/ACR/ASA/SCA/SCAI/SIR/STS/SVM Guidelines for the Diagnosis and Management of Patients with Thoracic Aortic Disease. Circulation.2010; 121: X528-U132   Electronically Signed   By: Dereck Ligas M.D.   On: 05/01/2014 20:00   Dg Chest Port 1 View  05/02/2014   CLINICAL DATA:  Edema.   Shortness of breath.  Cough.  EXAM: PORTABLE CHEST - 1 VIEW  COMPARISON:  CT 03/ 23/2016.  Chest x-ray 05/01/2014 .  FINDINGS: Stable thoracic aortic ectasia. Reference made to prior CT report. Cardiomegaly with mild pulmonary interstitial prominence again noted. These findings are again consistent with congestive heart failure. Tiny pleural effusions cannot be excluded. No pneumothorax. No acute osseus abnormality.  IMPRESSION: 1. Thoracic aortic ectasia again noted. Reference is made to prior CT report of 05/01/2014. Aortic contour is stable. 2. Mild congestive heart failure with mild interstitial edema. No significant interim change. Tiny bilateral pleural effusions cannot be excluded.   Electronically Signed   By: Marcello Moores  Register   On: 05/02/2014 07:30    ASSESSMENT AND PLAN  Lynnleigh Swaney is a 74 y.o. F with PMH of HTN and breast CA who presented to [facility] ED 3/23 with SOB. Symptoms were present for past 2 months, but worsened over the 2 - 3 days prior to admission. She saw her PCP and was found to be hypertensive so she was sent to the ER. In ED, BP noted to be 205 / 145.   1. Hypertensive  emergency with acute on chronic diastolic heart failure and acute pulm edema  - continue amlodipine, bidil, nebivolol. BP better controlled.   2. Acute on chronic systolic and diastolic heart failure  3. Probable CAD with evidence of 3v dx on CTA of chest and wall motion abnl on echo  - echo EF 25-30%, possible anteroseptal hypokinesis  - continue ASA, statin  - Continue IVF hydration at rate of 15ml/hr given EF 25-30%. Pending cath today at 1330. Renal function did improve slightly, still not at baseline of 1.2, will discuss with MD to see if comfortable with procedure today.  - ?if patient is better with L&R heart cath instead, currently scheduled for LHC  - ?does have shellfish allergy, but no documented contrast allergy  4. Distal thoracic arota arch enlargement: will need repeat CT in 1 yr  5.  Acute on CKD stage III   Ron Parker)  patient received IV fluids from yesterday until today. She has tolerated this. She is ready for cardiac catheterization to be done today.  6. H/o CVA   Signed, Woodward Ku Pager: [phone]  I have reviewed all the information the noted above. The patient's renal function is better today. She is scheduled for catheterization today.  Daryel November, MD

## 2014-05-07 NOTE — CV Procedure (Addendum)
     Left Heart Catheterization with Coronary Angiography  Report  Tara Dunn  74 y.o.  female 02-Apr-1940  Procedure Date: 05/07/2014 Referring Physician: Dola Argyle, M.D. Primary Cardiologist: Glori Bickers, M.D.  INDICATIONS: New left heart failure, accelerated hypertension, and three-vessel calcification by CT.   PROCEDURE: 1. Left heart catheterization; 2. Coronary angiography; 3. Left ventriculography; 4. Right heart catheterization not performed due to inability to obtain access in the right arm   CONSENT:  The risks, benefits, and details of the procedure were explained in detail to the patient. Risks including death, stroke, heart attack, kidney injury, allergy, limb ischemia, bleeding and radiation injury were discussed.  The patient verbalized understanding and wanted to proceed.  Informed written consent was obtained.  PROCEDURE TECHNIQUE:  After Xylocaine anesthesia a 5 French Slender sheath was placed in the right radial artery with an angiocath and the modified Seldinger technique.  Coronary angiography was done using a 5 F JR4, JL 3.5 cm, EBU 3 cm, and 1 cm left Amplatz diagnostic cath.  Left ventriculography was done using the JR 4 catheter and hand injection.   Images were reviewed and the case was terminated. A wrist band, using 10 cc of air, was used to achieve hemostasis.   CONTRAST:  Total of 115 cc.  COMPLICATIONS:  None   HEMODYNAMICS:  Aortic pressure 141/45 mmHg; LV pressure 146/4 mmHg; LVEDP 18 mmHg  ANGIOGRAPHIC DATA:   The left main coronary artery is widely patent.  The left anterior descending artery is heavily calcified. There is diffuse 30-50% narrowing from the proximal to mid vessel. 2 large diagonal branches arise from the proximal LAD segment. The LAD and the mid and distal segments is tortuous and wraps around the left ventricular apex. No significant obstruction is noted in the LAD and diagonals..  The left circumflex artery is  moderate to heavily calcified in the proximal segment. There is 30-40% tandem stenosis in the proximal to mid circumflex. The distal vessel bifurcates into 3 obtuse marginal branches each of which has luminal irregularities up to 50%.  The right coronary artery is arising from a more anterior and leftward orientation. We can only cannulate this vessel with a 1 Amplatz catheter. The proximal and mid vessel contains irregularities with up to 50% narrowing. The proximal third of the RCA  contains an eccentric somewhat hazy appearing 50-70% narrowing. PDA is moderate in size and contains irregularities but no high-grade obstruction.  LEFT VENTRICULOGRAM:  Left ventricular angiogram was done in the 30 RAO projection and revealed a mildly dilated cavity. The quality of the left ventriculogram is suboptimal due to hand injection. Anterior wall motion is mildly depressed. Inferior wall motion is moderately depressed. The estimated ejection fraction is 35%.   IMPRESSIONS:  1. Moderate three-vessel coronary artery disease with heavy three-vessel calcification. 2. The proximal to mid RCA contains an eccentric 50-70% narrowing. The proximal to mid circumflex contains 30-50% narrowing. There is diffuse 30-50% narrowing throughout the proximal and mid LAD. 3. LV enlargement with decreased systolic function and estimated ejection fraction of 35%. LV function appears improved compared to the echo results noted in the patient's clinical data.   RECOMMENDATION:    Further management per the treating team.  It would appear that aggressive blood pressure control with therapy that includes beta blockers and red and angiotensin inhibition will be quite helpful. It appears in LV function is already improving..  Follow kidney function with BMET in AM.

## 2014-05-07 NOTE — Interval H&P Note (Signed)
Cath Lab Visit (complete for each Cath Lab visit)  Clinical Evaluation Leading to the Procedure:   ACS: No.  Non-ACS:    Anginal Classification: CCS III  Anti-ischemic medical therapy: Maximal Therapy (2 or more classes of medications)  Non-Invasive Test Results: No non-invasive testing performed  Prior CABG: No previous CABG      History and Physical Interval Note:  05/07/2014 11:01 AM  Tara Dunn  has presented today for surgery, with the diagnosis of cp  The various methods of treatment have been discussed with the patient and family. After consideration of risks, benefits and other options for treatment, the patient has consented to  Procedure(s): LEFT HEART CATHETERIZATION WITH CORONARY ANGIOGRAM (N/A) as a surgical intervention .  The patient's history has been reviewed, patient examined, no change in status, stable for surgery.  I have reviewed the patient's chart and labs.  Questions were answered to the patient's satisfaction.     Sinclair Grooms

## 2014-05-07 NOTE — Progress Notes (Signed)
Physical Therapy Treatment Patient Details Name: Torryn Fiske MRN: [id number] DOB: Oct 22, 1940 Today's Date: 05/07/2014    History of Present Illness 74 y.o. F brought to [facility] ED 3/23 with SOB due to hypertensive urgency. PMHx: HTN and breast cancer, CVA, gout, CHF, CKD    PT Comments    Pt able to increase ambulation distance today in hallway and reports she feels her knees are getting stronger.  Pt needs cues to keep RW with her during functional activities. Con't to recommend RW and HHPT upon d/c.  Follow Up Recommendations  Home health PT;Supervision - Intermittent     Equipment Recommendations  Rolling walker with 5" wheels    Recommendations for Other Services       Precautions / Restrictions Precautions Precautions: Fall Precaution Comments: Pt reports that she has fallen 2 x in the last 4 months.  Was not using AD during falls Restrictions Weight Bearing Restrictions: No    Mobility  Bed Mobility               General bed mobility comments: Pt up brushing teeth upon arrival  Transfers Overall transfer level: Modified independent Equipment used: None                Ambulation/Gait Ambulation/Gait assistance: Min guard Ambulation Distance (Feet): 200 Feet Assistive device: Rolling walker (2 wheeled) Gait Pattern/deviations: Step-through pattern Gait velocity: Decreased   General Gait Details: Pt ambulates with decreased speed with RW in hallway, but tends to leave it in room and cruise furniture. Pt with slight limp during gait but no complaints.  Reports she feels her knees are getting stronger.   Stairs            Wheelchair Mobility    Modified Rankin (Stroke Patients Only)       Balance Overall balance assessment: Needs assistance   Sitting balance-Leahy Scale: Good     Standing balance support: During functional activity Standing balance-Leahy Scale: Fair Standing balance comment: Pt brushed teeth and washed face at sink  with fair balance. Cues to keep RW with her.                    Cognition Arousal/Alertness: Awake/alert Behavior During Therapy: WFL for tasks assessed/performed Overall Cognitive Status: Within Functional Limits for tasks assessed                      Exercises      General Comments        Pertinent Vitals/Pain Pain Assessment: No/denies pain    Home Living                      Prior Function            PT Goals (current goals can now be found in the care plan section) Acute Rehab PT Goals PT Goal Formulation: With patient/family Time For Goal Achievement: 05/10/14 Potential to Achieve Goals: Good Progress towards PT goals: Progressing toward goals    Frequency  Min 3X/week    PT Plan Current plan remains appropriate    Co-evaluation             End of Session Equipment Utilized During Treatment: Gait belt Activity Tolerance: Patient tolerated treatment well;Patient limited by fatigue Patient left: in chair;with call bell/phone within reach     Time: 0921-0937 PT Time Calculation (min) (ACUTE ONLY): 16 min  Charges:  $Gait Training: 8-22 mins  G Codes:      Cyrena Kuchenbecker LUBECK 05/07/2014, 9:53 AM

## 2014-05-07 NOTE — Progress Notes (Signed)
Patient Name: Tara Dunn Date of Encounter: 05/07/2014  Primary Cardiologist: New- Dr. Haroldine Laws   Principal Problem:   Acute on chronic combined systolic and diastolic heart failure Active Problems:   CKD (chronic kidney disease), stage III   Hx of stroke without residual deficits   Hypertensive emergency   Congestive heart disease   Acute renal failure superimposed on stage 3 chronic kidney disease    SUBJECTIVE  Denies any CP or SOB.   CURRENT MEDS . allopurinol  100 mg Oral Daily  . amLODipine  5 mg Oral Daily  . aspirin EC  81 mg Oral Daily  . atorvastatin  80 mg Oral q1800  . heparin  5,000 Units Subcutaneous 3 times per day  . isosorbide-hydrALAZINE  2 tablet Oral TID  . nebivolol  5 mg Oral Daily  . pantoprazole  40 mg Oral Daily  . sodium chloride  3 mL Intravenous Q12H    OBJECTIVE  Filed Vitals:   05/06/14 1850 05/06/14 2115 05/07/14 0515 05/07/14 1020  BP: 145/79 135/61 99/54 129/71  Pulse: 74 75 90 73  Temp: 97.8 F (36.6 C) 98.5 F (36.9 C) 98.7 F (37.1 C)   TempSrc: Oral Oral Oral   Resp: 18 18 17 16   Height:      Weight:   202 lb 6.4 oz (91.808 kg)   SpO2: 98% 94% 97% 99%    Intake/Output Summary (Last 24 hours) at 05/07/14 1039 Last data filed at 05/07/14 0514  Gross per 24 hour  Intake    240 ml  Output    275 ml  Net    -35 ml   Filed Weights   05/05/14 0657 05/06/14 0513 05/07/14 0515  Weight: 199 lb 4.8 oz (90.402 kg) 201 lb (91.173 kg) 202 lb 6.4 oz (91.808 kg)    PHYSICAL EXAM  General: Pleasant, NAD. Neuro: Alert and oriented X 3. Moves all extremities spontaneously. Psych: Normal affect. HEENT:  Normal  Neck: Supple without bruits or JVD. Lungs:  Resp regular and unlabored, CTA. Heart: RRR no s3, s4. 1/6 systolic murmur Abdomen: Soft, non-tender, non-distended, BS + x 4.  Extremities: No clubbing, cyanosis or edema. DP/PT/Radials 2+ and equal bilaterally.  Accessory Clinical Findings  CBC  Recent Labs  05/06/14 0411 05/07/14 0354  WBC 5.7 4.8  HGB 12.9 12.9  HCT 39.9 40.6  MCV 84.5 85.5  PLT 216 956   Basic Metabolic Panel  Recent Labs  05/06/14 0411 05/07/14 0354  NA 138 137  K 3.8 4.2  CL 103 106  CO2 25 24  GLUCOSE 101* 93  BUN 26* 23  CREATININE 1.60* 1.48*  CALCIUM 9.8 9.9    TELE NSR with HR 70-80s, no significant ventricular ectopy    ECG  No new EKG  Echocardiogram 05/02/2014  LV EF: 25% -  30%  Left ventricle: The cavity size was normal. There was moderate concentric hypertrophy. Systolic function was severely reduced. The estimated ejection fraction was in the range of 25% to 30%. Possible hypokinesis of the anteroseptal myocardium. Doppler parameters are consistent with abnormal left ventricular relaxation (grade 1 diastolic dysfunction). - Left atrium: The atrium was moderately to severely dilated.     Radiology/Studies  Dg Chest 2 View  05/01/2014   CLINICAL DATA:  Shortness of breath beginning yesterday, former smoker, LEFT breast cancer post mastectomy in 2009, hypertension  EXAM: CHEST  2 VIEW  COMPARISON:  None  FINDINGS: Enlargement of cardiac silhouette.  Mild elongation of thoracic aorta  with atherosclerotic calcification.  Bronchitic changes with subsegmental atelectasis at RIGHT base.  No definite acute infiltrate, pleural effusion or pneumothorax.  Bones demineralized.  IMPRESSION: Enlargement of cardiac silhouette.  Bronchitic changes with subsegmental atelectasis RIGHT base.   Electronically Signed   By: Lavonia Dana M.D.   On: 05/01/2014 15:33   Ct Angio Chest Pe W/cm &/or Wo Cm  05/01/2014   CLINICAL DATA:  Short of breath.  Cough.  Fever.  Chills.  EXAM: CT ANGIOGRAPHY CHEST WITH CONTRAST  TECHNIQUE: Multidetector CT imaging of the chest was performed using the standard protocol during bolus administration of intravenous contrast. Multiplanar CT image reconstructions and MIPs were obtained to evaluate the vascular anatomy.   CONTRAST:  100 mL Omnipaque 300.  COMPARISON:  Chest radiograph 05/01/2014.  FINDINGS: Bones: No aggressive osseous lesions. Age expected thoracic and lower cervical spondylosis.  Cardiovascular: Technically adequate study. No pulmonary embolism. No gross acute aortic abnormality.  Distal aortic arch measures 3.9 cm. Recommend annual imaging followup by CTA or MRA. This recommendation follows 2010 ACCF/AHA/AATS/ACR/ASA/SCA/SCAI/SIR/STS/SVM Guidelines for the Diagnosis and Management of Patients with Thoracic Aortic Disease. Circulation.2010; 121: O841-Y606  Lungs: Dependent atelectasis is present. There is airspace disease in the dependent portions of both upper lobes and both lower lobes. This is superimposed on the atelectasis. Interlobular septal thickening is present at the apices. No focal airspace consolidation to suggest pneumonia or aspiration.  Central airways: Patent.  Effusions: Small pleural effusions bilaterally. No pericardial effusion.  Lymphadenopathy: LEFT mastectomy and axillary dissection with scarring in the LEFT axilla. No RIGHT axillary adenopathy. No mediastinal or hilar adenopathy. Calcified RIGHT hilar lymph nodes consistent with old granulomatous disease.  Esophagus: Normal.  Upper abdomen: Atherosclerosis.  Other: None.  Review of the MIP images confirms the above findings.  IMPRESSION: 1. Negative for pulmonary embolism. 2. Constellation of findings compatible with mild to moderate CHF. Small bilateral pleural effusions and mild alveolar pulmonary edema along with interstitial edema. 3. 39 mm distal aortic arch. Recommend annual imaging followup by CTA or MRA. This recommendation follows 2010 ACCF/AHA/AATS/ACR/ASA/SCA/SCAI/SIR/STS/SVM Guidelines for the Diagnosis and Management of Patients with Thoracic Aortic Disease. Circulation.2010; 121: T016-W109   Electronically Signed   By: Dereck Ligas M.D.   On: 05/01/2014 20:00   Dg Chest Port 1 View  05/02/2014   CLINICAL DATA:  Edema.   Shortness of breath.  Cough.  EXAM: PORTABLE CHEST - 1 VIEW  COMPARISON:  CT 03/ 23/2016.  Chest x-ray 05/01/2014 .  FINDINGS: Stable thoracic aortic ectasia. Reference made to prior CT report. Cardiomegaly with mild pulmonary interstitial prominence again noted. These findings are again consistent with congestive heart failure. Tiny pleural effusions cannot be excluded. No pneumothorax. No acute osseus abnormality.  IMPRESSION: 1. Thoracic aortic ectasia again noted. Reference is made to prior CT report of 05/01/2014. Aortic contour is stable. 2. Mild congestive heart failure with mild interstitial edema. No significant interim change. Tiny bilateral pleural effusions cannot be excluded.   Electronically Signed   By: Marcello Moores  Register   On: 05/02/2014 07:30    ASSESSMENT AND PLAN  Tara Dunn is a 74 y.o. F with PMH of HTN and breast CA who presented to [facility] ED 3/23 with SOB. Symptoms were present for past 2 months, but worsened over the 2 - 3 days prior to admission. She saw her PCP and was found to be hypertensive so she was sent to the ER. In ED, BP noted to be 205 / 145.   1. Hypertensive  emergency with acute on chronic diastolic heart failure and acute pulm edema  - continue amlodipine, bidil, nebivolol. BP better controlled.   2. Acute on chronic systolic and diastolic heart failure  3. Probable CAD with evidence of 3v dx on CTA of chest and wall motion abnl on echo  - echo EF 25-30%, possible anteroseptal hypokinesis  - continue ASA, statin  - Continue IVF hydration at rate of 6ml/hr given EF 25-30%. Pending cath today at 1330. Renal function did improve slightly, still not at baseline of 1.2, will discuss with MD to see if comfortable with procedure today.  - ?if patient is better with L&R heart cath instead, currently scheduled for LHC  - ?does have shellfish allergy, but no documented contrast allergy  4. Distal thoracic arota arch enlargement: will need repeat CT in 1 yr  5.  Acute on CKD stage III   Ron Parker)  patient received IV fluids from yesterday until today. She has tolerated this. She is ready for cardiac catheterization to be done today.  6. H/o CVA   Signed, Woodward Ku Pager: [phone]  I have reviewed all the information the noted above. The patient's renal function is better today. She is scheduled for catheterization today.  Daryel November, MD

## 2014-05-07 NOTE — Progress Notes (Addendum)
1630 Pt bleed with moderate to puncture to R wrist s/p cardiac cath area  When attempted to grab siderails .Digital pressure applied until bleeding ceased for few seconds .Keep R arm elevated .Gauze dressing applied . Seen by [facility] Cardiac cath RN ,Delrae Alfred  Observed the pti no hematroma noted Site remained level 0

## 2014-05-07 NOTE — Consult Note (Signed)
   [facility] CM Inpatient Consult   05/07/2014  Tara Dunn Apr 08, 1940 [id number]  Met with the patient at bedside to offer [place] [facility] services as benefit of [facility] [employer] insurance. Patient agreeable to services and will receive post hospital discharge call and will be evaluated for monthly home visits. Consent form signed and folder with [place] [facility] information given.  Of note, [facility] Care Management services does not replace or interfere with any services that are arranged by inpatient case management or social work. For additional questions or referrals please contact: Natividad Brood, RN BSN [facility] Liaison  [phone] business mobile phone

## 2014-05-08 LAB — BASIC METABOLIC PANEL
ANION GAP: 6 (ref 5–15)
BUN: 20 mg/dL (ref 6–23)
CHLORIDE: 106 mmol/L (ref 96–112)
CO2: 24 mmol/L (ref 19–32)
Calcium: 9.9 mg/dL (ref 8.4–10.5)
Creatinine, Ser: 1.44 mg/dL — ABNORMAL HIGH (ref 0.50–1.10)
GFR, EST AFRICAN AMERICAN: 40 mL/min — AB (ref >90)
GFR, EST NON AFRICAN AMERICAN: 35 mL/min — AB (ref >90)
Glucose, Bld: 102 mg/dL — ABNORMAL HIGH (ref 70–99)
Potassium: 4.2 mmol/L (ref 3.5–5.1)
Sodium: 136 mmol/L (ref 135–145)

## 2014-05-08 LAB — CBC
HCT: 39.7 % (ref 36.0–46.0)
HEMOGLOBIN: 12.7 g/dL (ref 12.0–15.0)
MCH: 27.4 pg (ref 26.0–34.0)
MCHC: 32 g/dL (ref 30.0–36.0)
MCV: 85.7 fL (ref 78.0–100.0)
Platelets: 231 10*3/uL (ref 150–400)
RBC: 4.63 MIL/uL (ref 3.87–5.11)
RDW: 16 % — AB (ref 11.5–15.5)
WBC: 5 10*3/uL (ref 4.0–10.5)

## 2014-05-08 MED ORDER — ISOSORB DINITRATE-HYDRALAZINE 20-37.5 MG PO TABS: 2.0000 | ORAL_TABLET | Freq: Three times a day (TID) | ORAL | Status: DC

## 2014-05-08 MED ORDER — AMLODIPINE BESYLATE 5 MG PO TABS: 5.0000 mg | ORAL_TABLET | Freq: Every day | ORAL | Status: DC

## 2014-05-08 MED ORDER — ATORVASTATIN CALCIUM 80 MG PO TABS: 80.0000 mg | ORAL_TABLET | Freq: Every day | ORAL | Status: DC

## 2014-05-08 MED ORDER — FUROSEMIDE 20 MG PO TABS: 20.0000 mg | ORAL_TABLET | ORAL | Status: DC | PRN

## 2014-05-08 MED ORDER — ATORVASTATIN CALCIUM 40 MG PO TABS: 40.0000 mg | ORAL_TABLET | Freq: Every day | ORAL | Status: DC

## 2014-05-08 NOTE — Discharge Summary (Signed)
Physician Discharge Summary  Tara Dunn NLZ:[id number] DOB: 11-12-1940 DOA: 05/01/2014  PCP: No primary care provider on file.  Admit date: 05/01/2014 Discharge date: 05/08/2014  Time spent: 30 minutes  Recommendations for Outpatient Follow-up:  1. Follow-up with cardiology in 2 weeks. 2. His repeat CT scan on March 2017 for distal thoracic aortic arch enlargement.  Discharge Diagnoses:  Principal Problem:   Acute on chronic combined systolic and diastolic heart failure Active Problems:   CKD (chronic kidney disease), stage III   Hx of stroke without residual deficits   Hypertensive emergency   Congestive heart disease   Acute renal failure superimposed on stage 3 chronic kidney disease   Discharge Condition: stable  Diet recommendation: low sodium fluid restricted diet  Filed Weights   05/06/14 0513 05/07/14 0515 05/08/14 0559  Weight: 91.173 kg (201 lb) 91.808 kg (202 lb 6.4 oz) 90.674 kg (199 lb 14.4 oz)    History of present illness:  74 y.o. F brought to [facility] ED 3/23 with SOB due to hypertensive urgency. Started on nitro gtt and PCCM called for ICU admission.    Hospital Course:  Acute on chronic combined systolic and diastolic heart failure: - Start her on IV diuresis, Diuresis well. - His estimated dry weight is 90.6 kg. - Cardiac cath on 05/07/2014 showed an EF of 20-30%, with possible hypokinesia of the anteroseptal myocardium and severe left atrial dilation. - Her blood pressure has tolerated beta blockers, hydralazine and nitrates. - We'll go home on Lasix when necessary for weight gain of more than 5 pounds.  Moderate three-vessel CAD: - Currently on a statin LDL 70 HDL 48.  Acute renal failure superimposed on stage 3 chronic kidney disease: - Due to acute toxic contrast nephropathy and diuresis. - Creatinine improving with hydration. - Baseline creatinine around 1.2.  - We'll need to resume his outpatient by her PCP.   Hypertensive emergency: -  She was started on IV medication and transitioned to oral. - Blood pressure continues to improve continue beta blocker Norvasc BiDil. - Restart ACE inhibitor as an outpatient.  Distal thoracic aortic arch enlargement: Will need a repeat CT scan next year March 2017.  Procedures:  Cardiac cath  Consultations:  Cardiology  PCCM  Discharge Exam: Filed Vitals:   05/08/14 0735  BP: 130/61  Pulse: 77  Temp:   Resp: 18    General: A&O x3 Cardiovascular: RRR Respiratory: good air movement CTA B/L  Discharge Instructions   Discharge Instructions    AMB Referral to [place] Management    Complete by:  As directed   Reason for consult:  New HF, hypertension, new home medicines anticipated  Diagnoses of:   Heart Failure Kidney Failure    Expected date of contact:  1-3 days (reserved for hospital discharges)     Diet - low sodium heart healthy    Complete by:  As directed      Increase activity slowly    Complete by:  As directed           Current Discharge Medication List    START taking these medications   Details  amLODipine (NORVASC) 5 MG tablet Take 1 tablet (5 mg total) by mouth daily. Qty: 30 tablet, Refills: 0    atorvastatin (LIPITOR) 80 MG tablet Take 1 tablet (80 mg total) by mouth daily at 6 PM. Qty: 30 tablet, Refills: 0    isosorbide-hydrALAZINE (BIDIL) 20-37.5 MG per tablet Take 2 tablets by mouth 3 (three) times daily. Qty:  30 tablet, Refills: 0      CONTINUE these medications which have NOT CHANGED   Details  allopurinol (ZYLOPRIM) 100 MG tablet Take 100 mg by mouth daily. Refills: 0    BYSTOLIC 2.5 MG tablet Take 2.5 mg by mouth daily.  Refills: 1    omeprazole (PRILOSEC) 40 MG capsule Take 40 mg by mouth daily. Refills: 0      STOP taking these medications     ibuprofen (ADVIL,MOTRIN) 200 MG tablet        Allergies  Allergen Reactions  . Shellfish Allergy Hives      The results of significant diagnostics from this  hospitalization (including imaging, microbiology, ancillary and laboratory) are listed below for reference.    Significant Diagnostic Studies: Dg Chest 2 View  05/01/2014   CLINICAL DATA:  Shortness of breath beginning yesterday, former smoker, LEFT breast cancer post mastectomy in 2009, hypertension  EXAM: CHEST  2 VIEW  COMPARISON:  None  FINDINGS: Enlargement of cardiac silhouette.  Mild elongation of thoracic aorta with atherosclerotic calcification.  Bronchitic changes with subsegmental atelectasis at RIGHT base.  No definite acute infiltrate, pleural effusion or pneumothorax.  Bones demineralized.  IMPRESSION: Enlargement of cardiac silhouette.  Bronchitic changes with subsegmental atelectasis RIGHT base.   Electronically Signed   By: Lavonia Dana M.D.   On: 05/01/2014 15:33   Ct Angio Chest Pe W/cm &/or Wo Cm  05/01/2014   CLINICAL DATA:  Short of breath.  Cough.  Fever.  Chills.  EXAM: CT ANGIOGRAPHY CHEST WITH CONTRAST  TECHNIQUE: Multidetector CT imaging of the chest was performed using the standard protocol during bolus administration of intravenous contrast. Multiplanar CT image reconstructions and MIPs were obtained to evaluate the vascular anatomy.  CONTRAST:  100 mL Omnipaque 300.  COMPARISON:  Chest radiograph 05/01/2014.  FINDINGS: Bones: No aggressive osseous lesions. Age expected thoracic and lower cervical spondylosis.  Cardiovascular: Technically adequate study. No pulmonary embolism. No gross acute aortic abnormality.  Distal aortic arch measures 3.9 cm. Recommend annual imaging followup by CTA or MRA. This recommendation follows 2010 ACCF/AHA/AATS/ACR/ASA/SCA/SCAI/SIR/STS/SVM Guidelines for the Diagnosis and Management of Patients with Thoracic Aortic Disease. Circulation.2010; 121: C003-K917  Lungs: Dependent atelectasis is present. There is airspace disease in the dependent portions of both upper lobes and both lower lobes. This is superimposed on the atelectasis. Interlobular septal  thickening is present at the apices. No focal airspace consolidation to suggest pneumonia or aspiration.  Central airways: Patent.  Effusions: Small pleural effusions bilaterally. No pericardial effusion.  Lymphadenopathy: LEFT mastectomy and axillary dissection with scarring in the LEFT axilla. No RIGHT axillary adenopathy. No mediastinal or hilar adenopathy. Calcified RIGHT hilar lymph nodes consistent with old granulomatous disease.  Esophagus: Normal.  Upper abdomen: Atherosclerosis.  Other: None.  Review of the MIP images confirms the above findings.  IMPRESSION: 1. Negative for pulmonary embolism. 2. Constellation of findings compatible with mild to moderate CHF. Small bilateral pleural effusions and mild alveolar pulmonary edema along with interstitial edema. 3. 39 mm distal aortic arch. Recommend annual imaging followup by CTA or MRA. This recommendation follows 2010 ACCF/AHA/AATS/ACR/ASA/SCA/SCAI/SIR/STS/SVM Guidelines for the Diagnosis and Management of Patients with Thoracic Aortic Disease. Circulation.2010; 121: H150-V697   Electronically Signed   By: Dereck Ligas M.D.   On: 05/01/2014 20:00   Dg Chest Port 1 View  05/02/2014   CLINICAL DATA:  Edema.  Shortness of breath.  Cough.  EXAM: PORTABLE CHEST - 1 VIEW  COMPARISON:  CT 03/ 23/2016.  Chest  x-ray 05/01/2014 .  FINDINGS: Stable thoracic aortic ectasia. Reference made to prior CT report. Cardiomegaly with mild pulmonary interstitial prominence again noted. These findings are again consistent with congestive heart failure. Tiny pleural effusions cannot be excluded. No pneumothorax. No acute osseus abnormality.  IMPRESSION: 1. Thoracic aortic ectasia again noted. Reference is made to prior CT report of 05/01/2014. Aortic contour is stable. 2. Mild congestive heart failure with mild interstitial edema. No significant interim change. Tiny bilateral pleural effusions cannot be excluded.   Electronically Signed   By: Marcello Moores  Register   On: 05/02/2014  07:30    Microbiology: Recent Results (from the past 240 hour(s))  MRSA PCR Screening     Status: None   Collection Time: 05/01/14 10:30 PM  Result Value Ref Range Status   MRSA by PCR NEGATIVE NEGATIVE Final    Comment:        The GeneXpert MRSA Assay (FDA approved for NASAL specimens only), is one component of a comprehensive MRSA colonization surveillance program. It is not intended to diagnose MRSA infection nor to guide or monitor treatment for MRSA infections.      Labs: Basic Metabolic Panel:  Recent Labs Lab 05/02/14 0420  05/04/14 0334 05/05/14 0408 05/06/14 0411 05/07/14 0354 05/08/14 0336  NA 139  < > 136 135 138 137 136  K 3.2*  < > 3.8 4.0 3.8 4.2 4.2  CL 105  < > 102 104 103 106 106  CO2 25  < > 27 23 25 24 24   GLUCOSE 101*  < > 105* 98 101* 93 102*  BUN 18  < > 24* 26* 26* 23 20  CREATININE 1.18*  < > 1.45* 1.51* 1.60* 1.48* 1.44*  CALCIUM 9.5  < > 9.7 9.5 9.8 9.9 9.9  MG 1.6  --   --   --   --   --   --   PHOS 3.2  --   --   --   --   --   --   < > = values in this interval not displayed. Liver Function Tests:  Recent Labs Lab 05/04/14 0334  AST 23  ALT 17  ALKPHOS 94  BILITOT 0.7  PROT 6.2  ALBUMIN 3.4*   No results for input(s): LIPASE, AMYLASE in the last 168 hours. No results for input(s): AMMONIA in the last 168 hours. CBC:  Recent Labs Lab 05/02/14 0420 05/03/14 0530 05/06/14 0411 05/07/14 0354 05/08/14 0336  WBC 5.4 4.0 5.7 4.8 5.0  HGB 13.9 13.8 12.9 12.9 12.7  HCT 42.0 42.1 39.9 40.6 39.7  MCV 83.5 83.2 84.5 85.5 85.7  PLT 198 209 216 234 231   Cardiac Enzymes:  Recent Labs Lab 05/01/14 2152 05/02/14 0420 05/02/14 0947  TROPONINI 0.03 0.03 0.03   BNP: BNP (last 3 results)  Recent Labs  05/01/14 1424  BNP 433.8*    ProBNP (last 3 results) No results for input(s): PROBNP in the last 8760 hours.  CBG: No results for input(s): GLUCAP in the last 168 hours.     Signed:  Charlynne Cousins  [facility] 05/08/2014, 1:37 PM

## 2014-05-08 NOTE — Progress Notes (Signed)
[facility] PROGRESS NOTE Interim History: 74 y.o. F with PMH of HTN and breast CA who presented to [facility] ED 3/23 with SOB. Symptoms were present for past 2 months, but worsened over the 2 - 3 days prior to admission. She saw her PCP and was found to be hypertensive so she was sent to the ER. SHe had been diuresed cardiology consulted. She was diuresed and developed some mild AKI which may also have been due to contrast-induced nephropathy from a CT anterior chest a few days prior. Her catheterization was deferred until 3/29. She underwent cardiac catheterization on 3/29 which demonstrated moderate three-vessel coronary artery disease with healthy three-vessel calcifications. The proximal to mid RCA 50-70%, proximal to mid circumflex 30-50%, proximal and mid LAD 30-50%. LV demonstrated decreased systolic function and an ejection fraction of 35%.  Filed Weights   05/06/14 0513 05/07/14 0515 05/08/14 0559  Weight: 91.173 kg (201 lb) 91.808 kg (202 lb 6.4 oz) 90.674 kg (199 lb 14.4 oz)        Intake/Output Summary (Last 24 hours) at 05/08/14 6295 Last data filed at 05/08/14 0600  Gross per 24 hour  Intake    340 ml  Output   1900 ml  Net  -1560 ml     Assessment/Plan: Acute on chronic combined systolic and diastolic heart failure - Diuresis has been held, he is about 3 L negative. - His estimated dry weight is 90.6 kg. - Cardiac cath on 05/07/2014 showed an EF of 20-30%, with possible hypokinesia of the anteroseptal myocardium and severe left atrial dilation. - His blood pressure has tolerated beta blockers, hydralazine and nitrates. - Again holding Lasix due to acute renal failure. Not a candidate for spironolactone at this point.  Moderate three-vessel CAD: - Currently on a statin LDL 70 HDL 48.    Acute renal failure superimposed on stage 3 chronic kidney disease: - Due to acute toxic contrast nephropathy and diuresis. - Creatinine improving with hydration. - Baseline  creatinine around 1.2. Continue to hold ACE inhibitor.  CKD (chronic kidney disease), stage III    Hx of stroke without residual deficits    Hypertensive emergency: - Blood pressure continues to improve continue beta blocker Norvasc BiDil. - Restart ACE inhibitor as an outpatient.  Distal thoracic aortic arch enlargement: Will need a repeat CT scan next year March 2017.    Code Status: full Family Communication: patient and sisters Disposition Plan: PT/OT recommending [facility]. Likely home Wednesday if kidney function stable to improved   Consultants:  Cardiology  PCCM  Procedures: ECHO:  LHC 3.29.2016 CT angio chest.  Antibiotics:  none  HPI/Subjective: No complaints was go home.  Objective: Filed Vitals:   05/07/14 2032 05/08/14 0221 05/08/14 0559 05/08/14 0735  BP: 98/60 125/60 125/58 130/61  Pulse: 72 88 85 77  Temp: 97.9 F (36.6 C) 97.9 F (36.6 C) 98.2 F (36.8 C)   TempSrc: Oral Oral Oral   Resp: 20 20 20 18   Height:      Weight:   90.674 kg (199 lb 14.4 oz)   SpO2: 97% 98% 97% 99%     Exam:  General: Alert, awake, oriented x3, in no acute distress.  HEENT: No bruits, no goiter.  Heart: Regular rate and rhythm. Lungs: Good air movement, clear Abdomen: Soft, nontender, nondistended, positive bowel sounds.    Data Reviewed: Basic Metabolic Panel:  Recent Labs Lab 05/02/14 0420  05/04/14 0334 05/05/14 0408 05/06/14 0411 05/07/14 0354 05/08/14 0336  NA 139  < >  136 135 138 137 136  K 3.2*  < > 3.8 4.0 3.8 4.2 4.2  CL 105  < > 102 104 103 106 106  CO2 25  < > 27 23 25 24 24   GLUCOSE 101*  < > 105* 98 101* 93 102*  BUN 18  < > 24* 26* 26* 23 20  CREATININE 1.18*  < > 1.45* 1.51* 1.60* 1.48* 1.44*  CALCIUM 9.5  < > 9.7 9.5 9.8 9.9 9.9  MG 1.6  --   --   --   --   --   --   PHOS 3.2  --   --   --   --   --   --   < > = values in this interval not displayed. Liver Function Tests:  Recent Labs Lab 05/04/14 0334  AST 23  ALT 17    ALKPHOS 94  BILITOT 0.7  PROT 6.2  ALBUMIN 3.4*   No results for input(s): LIPASE, AMYLASE in the last 168 hours. No results for input(s): AMMONIA in the last 168 hours. CBC:  Recent Labs Lab 05/02/14 0420 05/03/14 0530 05/06/14 0411 05/07/14 0354 05/08/14 0336  WBC 5.4 4.0 5.7 4.8 5.0  HGB 13.9 13.8 12.9 12.9 12.7  HCT 42.0 42.1 39.9 40.6 39.7  MCV 83.5 83.2 84.5 85.5 85.7  PLT 198 209 216 234 231   Cardiac Enzymes:  Recent Labs Lab 05/01/14 2152 05/02/14 0420 05/02/14 0947  TROPONINI 0.03 0.03 0.03   BNP (last 3 results)  Recent Labs  05/01/14 1424  BNP 433.8*    ProBNP (last 3 results) No results for input(s): PROBNP in the last 8760 hours.  CBG: No results for input(s): GLUCAP in the last 168 hours.  Recent Results (from the past 240 hour(s))  MRSA PCR Screening     Status: None   Collection Time: 05/01/14 10:30 PM  Result Value Ref Range Status   MRSA by PCR NEGATIVE NEGATIVE Final    Comment:        The GeneXpert MRSA Assay (FDA approved for NASAL specimens only), is one component of a comprehensive MRSA colonization surveillance program. It is not intended to diagnose MRSA infection nor to guide or monitor treatment for MRSA infections.      Studies: No results found.  Scheduled Meds: . allopurinol  100 mg Oral Daily  . amLODipine  5 mg Oral Daily  . aspirin EC  81 mg Oral Daily  . atorvastatin  80 mg Oral q1800  . heparin  5,000 Units Subcutaneous 3 times per day  . isosorbide-hydrALAZINE  2 tablet Oral TID  . nebivolol  5 mg Oral Daily  . pantoprazole  40 mg Oral Daily   Continuous Infusions:    Charlynne Cousins  [facility] Pager [phone] If 7PM-7AM, please contact night-coverage at [email or web address], password [facility] 05/08/2014, 8:14 AM  LOS: 7 days

## 2014-05-08 NOTE — Progress Notes (Signed)
Subjective: Breathing better.  No CP  Objective: Vital signs in last 24 hours: Temp:  [97.7 F (36.5 C)-98.2 F (36.8 C)] 98.2 F (36.8 C) (03/30 0559) Pulse Rate:  [28-88] 77 (03/30 0735) Resp:  [16-20] 18 (03/30 0735) BP: (98-157)/(58-75) 130/61 mmHg (03/30 0735) SpO2:  [97 %-99 %] 99 % (03/30 0735) Weight:  [199 lb 14.4 oz (90.674 kg)] 199 lb 14.4 oz (90.674 kg) (03/30 0559) Last BM Date: 05/07/14  Intake/Output from previous day: 03/29 0701 - 03/30 0700 In: 340 [P.O.:340] Out: 1900 [Urine:1900] Intake/Output this shift:    Medications Current Facility-Administered Medications  Medication Dose Route Frequency Provider Last Rate Last Dose  . acetaminophen (TYLENOL) tablet 650 mg  650 mg Oral Q4H PRN Belva Crome, MD      . allopurinol (ZYLOPRIM) tablet 100 mg  100 mg Oral Daily Rahul P Desai, PA-C   100 mg at 05/07/14 1007  . alum & mag hydroxide-simeth (MAALOX/MYLANTA) 200-200-20 MG/5ML suspension 15 mL  15 mL Oral Q4H PRN Janece Canterbury, MD   15 mL at 05/05/14 1435  . amLODipine (NORVASC) tablet 5 mg  5 mg Oral Daily Janece Canterbury, MD   5 mg at 05/07/14 1007  . aspirin EC tablet 81 mg  81 mg Oral Daily Rogelia Mire, NP   81 mg at 05/07/14 1007  . atorvastatin (LIPITOR) tablet 80 mg  80 mg Oral q1800 Janece Canterbury, MD   80 mg at 05/07/14 1859  . heparin injection 5,000 Units  5,000 Units Subcutaneous 3 times per day Belva Crome, MD   5,000 Units at 05/08/14 0602  . hydrALAZINE (APRESOLINE) injection 10-40 mg  10-40 mg Intravenous Q4H PRN Wilhelmina Mcardle, MD   10 mg at 05/02/14 2154  . isosorbide-hydrALAZINE (BIDIL) 20-37.5 MG per tablet 2 tablet  2 tablet Oral TID Janece Canterbury, MD   2 tablet at 05/07/14 2224  . nebivolol (BYSTOLIC) tablet 5 mg  5 mg Oral Daily Janece Canterbury, MD   5 mg at 05/07/14 1008  . ondansetron (ZOFRAN) injection 4 mg  4 mg Intravenous Q6H PRN Belva Crome, MD      . oxyCODONE-acetaminophen (PERCOCET/ROXICET) 5-325 MG per tablet  1-2 tablet  1-2 tablet Oral Q4H PRN Belva Crome, MD      . pantoprazole (PROTONIX) EC tablet 40 mg  40 mg Oral Daily Rahul P Desai, PA-C   40 mg at 05/07/14 1009  . zolpidem (AMBIEN) tablet 5 mg  5 mg Oral QHS PRN Janece Canterbury, MD        PE: General appearance: alert, cooperative and no distress Lungs: clear to auscultation bilaterally Heart: regular rate and rhythm and 1/6 sys MM LSB Extremities: No LEE Pulses: 2+ and symmetric Skin: No LEE Neurologic: Grossly normal  Lab Results:   Recent Labs  05/06/14 0411 05/07/14 0354 05/08/14 0336  WBC 5.7 4.8 5.0  HGB 12.9 12.9 12.7  HCT 39.9 40.6 39.7  PLT 216 234 231   BMET  Recent Labs  05/06/14 0411 05/07/14 0354 05/08/14 0336  NA 138 137 136  K 3.8 4.2 4.2  CL 103 106 106  CO2 25 24 24   GLUCOSE 101* 93 102*  BUN 26* 23 20  CREATININE 1.60* 1.48* 1.44*  CALCIUM 9.8 9.9 9.9   PT/INR  Recent Labs  05/06/14 0431  LABPROT 12.8  INR 0.95    Assessment/Plan     Acute on chronic combined systolic and diastolic heart failure Net fluids: -  1.6L/-3.3L.  Appears euvolemic.  EF 25-30%, G1DD.  She did well with precath hydration.  Last lasix dose was 3/27 and net fluids were still negative.  I think PRN lasix will be good.  We discussed daily weight monitorng and low sodium diet.    Follow up arranged with CHF clinic.    CKD (chronic kidney disease), stage III SCr stable.  Was hydrated prior to heart cath.        CAD SP left heart cath revealing moderate three-vessel coronary artery disease with heavy three-vessel calcification. The proximal to mid RCA contains an eccentric 50-70% narrowing. The proximal to mid circumflex contains 30-50% narrowing. There is diffuse 30-50% narrowing throughout the proximal and mid LAD.  LV enlargement with decreased systolic function and estimated ejection fraction of 35%.  LV function appears improved compared to the echo results noted in the patient's clinical data.  Medical  therapy    Hx of stroke without residual deficits      Hypertensive emergency  BP improved.  Amlodipine 5mg , Bidil 07/68.0, bystolic 5.      Acute renal failure superimposed on stage 3 chronic kidney disease  SCr stable   Distal thoracic arota arch enlargement: will need repeat CT in 1 yr   LOS: 7 days    HAGER, BRYAN PA-C 05/08/2014 9:40 AM  Patient seen and examined. I agree with the assessment and plan as detailed above. See also my additional thoughts below.   I have reviewed all data. I agree with the note above.  Dola Argyle, MD, [facility] 05/08/2014 11:15 AM

## 2014-05-08 NOTE — Progress Notes (Signed)
Physical Therapy Treatment Patient Details Name: Tara Dunn MRN: [id number] DOB: 08/07/1940 Today's Date: 05/08/2014    History of Present Illness 74 y.o. F brought to [facility] ED 3/23 with SOB due to hypertensive urgency. PMHx: HTN and breast cancer, CVA, gout, CHF, CKD    PT Comments    Pt progressing well with PT. Demo incr stability and balance with gt when using RW. Cont to recommend HHPT upon D/C.   Follow Up Recommendations  Home health PT;Supervision - Intermittent     Equipment Recommendations  Rolling walker with 5" wheels    Recommendations for Other Services       Precautions / Restrictions Precautions Precautions: Fall Restrictions Weight Bearing Restrictions: No    Mobility  Bed Mobility Overal bed mobility: Independent                Transfers Overall transfer level: Modified independent Equipment used: None             General transfer comment: No physical assist required to move to standing.  Good static standing balance.  Ambulation/Gait Ambulation/Gait assistance: Modified independent (Device/Increase time);Supervision Ambulation Distance (Feet): 90 Feet Assistive device: Rolling walker (2 wheeled) Gait Pattern/deviations: Step-through pattern;Decreased stride length;Decreased dorsiflexion - left;Decreased dorsiflexion - right Gait velocity: Decreased Gait velocity interpretation: Below normal speed for age/gender General Gait Details: min cues for safety with RW, primarily with directional changes; no LOB noted; pt furniture walking in room; demo good standing balance at sink during ADLs   Stairs            Wheelchair Mobility    Modified Rankin (Stroke Patients Only)       Balance Overall balance assessment: Needs assistance Sitting-balance support: Feet supported;No upper extremity supported Sitting balance-Leahy Scale: Good     Standing balance support: During functional activity;Single extremity supported;No  upper extremity supported Standing balance-Leahy Scale: Fair Standing balance comment: able to reach out of BOS ; guarded due to previous falls; incr balance with RW                    Cognition Arousal/Alertness: Awake/alert Behavior During Therapy: WFL for tasks assessed/performed Overall Cognitive Status: Within Functional Limits for tasks assessed                      Exercises      General Comments        Pertinent Vitals/Pain Pain Assessment: No/denies pain    Home Living                      Prior Function            PT Goals (current goals can now be found in the care plan section) Acute Rehab PT Goals Patient Stated Goal: to go home today PT Goal Formulation: With patient/family Time For Goal Achievement: 05/10/14 Potential to Achieve Goals: Good Progress towards PT goals: Progressing toward goals    Frequency  Min 3X/week    PT Plan Current plan remains appropriate    Co-evaluation             End of Session   Activity Tolerance: Patient tolerated treatment well Patient left: in chair;with call bell/phone within reach     Time: 0813-0827 PT Time Calculation (min) (ACUTE ONLY): 14 min  Charges:  $Gait Training: 8-22 mins                    G Codes:  [place], [place] 05/08/2014, 9:36 AM

## 2014-05-08 NOTE — Progress Notes (Signed)
Heart Failure Navigator Consult Note  Presentation: Tara Dunn is a 74 y.o. F with PMH of HTN and breast CA who presented to [facility] ED 3/23 with SOB. She states her symptoms have been present for past 2 months, but have worsened over the past 2 - 3 days. 1 day ago, symptoms were at their worst; however, she did not feel like seeking medical attention despite her children urging her to do so. On 3/23, symptoms persisted so she agreed to go to PCP where she was found to be hypertensive. PCP sent her to ED for further evaluation. In ED, BP noted to be 205 / 145.  She takes antihypertensives at home and states that she did not take them on 3/22 but otherwise has not missed any doses. Denies any headaches, visual disturbances, chest pain, N/V/D, abd pain, myalgias, calf pain. No recent periods of prolonged immobilization or travel. No prior hx of clots. Does have hx of breast CA. She was started on nitroglycerin infusion and was also given lasix and hydralazine.   Past Medical History  Diagnosis Date  . Hypertension     a. Dx @ age 7;  b. 04/2014 admission for HTN emergency.  . Cardiomyopathy     a. 04/2014 Echo: EF 25-30%, mod conc LVH, possibl antsept HK, Gr1 DD, mod-sev dil LA.  Marland Kitchen History of stroke     a. 3 strokes - last in 1998.  . Aortic dilatation     a. 04/2014 CTA chest w/ incidental finding of distal thoracic Ao enlargement of 3.39 mm - f/u needed 04/2015.  . Breast cancer     a. 2009 s/p L mastectomy and chemo.  . CKD (chronic kidney disease), stage III   . Gout   . Obesity     History   Social History  . Marital Status: Single    Spouse Name: N/A  . Number of Children: N/A  . Years of Education: N/A   Social History Main Topics  . Smoking status: Former Smoker -- 0.50 packs/day for 40 years    Types: Cigarettes  . Smokeless tobacco: Not on file     Comment: Quit in 2009.  Marland Kitchen Alcohol Use: No  . Drug Use: No  . Sexual Activity: Not on file   Other Topics  Concern  . None   Social History Narrative   Lives in [place].  Son and Sister nearby.  Moved here from [place], [place] in Fall of 2015.  Does not routinely exericse.    ECHO:Study Conclusions--05/02/14  - Left ventricle: The cavity size was normal. There was moderate concentric hypertrophy. Systolic function was severely reduced. The estimated ejection fraction was in the range of 25% to 30%. Possible hypokinesis of the anteroseptal myocardium. Doppler parameters are consistent with abnormal left ventricular relaxation (grade 1 diastolic dysfunction). - Left atrium: The atrium was moderately to severely dilated.  Transthoracic echocardiography. M-mode, complete 2D, spectral Doppler, and color Doppler. Birthdate: Patient birthdate: 05/28/1940. Age: Patient is 74 yr old. Sex: Gender: female. BMI: 34.6 kg/m^2. Blood pressure:   148/80 Patient status: Inpatient. Study date: Study date: 05/02/2014. Study time: 03:38 PM. Location: Bedside. -------------------------------------------------------------------  ------------------------------------------------------------------- Left ventricle: The cavity size was normal. There was moderate concentric hypertrophy. Systolic function was severely reduced. The estimated ejection fraction was in the range of 25% to 30%. Regional wall motion abnormalities:  Possible hypokinesis of the anteroseptal myocardium. Doppler parameters are consistent with abnormal left ventricular relaxation (grade 1 diastolic Dysfunction).   Cardiac Catheterization:3/ 29/16  HEMODYNAMICS: Aortic pressure 141/45 mmHg; LV pressure 146/4 mmHg; LVEDP 18 mmHg  ANGIOGRAPHIC DATA: The left main coronary artery is widely patent.  The left anterior descending artery is heavily calcified. There is diffuse 30-50% narrowing from the proximal to mid vessel. 2 large diagonal branches arise from the proximal LAD segment. The LAD and the mid and distal  segments is tortuous and wraps around the left ventricular apex. No significant obstruction is noted in the LAD and diagonals..  The left circumflex artery is moderate to heavily calcified in the proximal segment. There is 30-40% tandem stenosis in the proximal to mid circumflex. The distal vessel bifurcates into 3 obtuse marginal branches each of which has luminal irregularities up to 50%.  The right coronary artery is arising from a more anterior and leftward orientation. We can only cannulate this vessel with a 1 Amplatz catheter. The proximal and mid vessel contains irregularities with up to 50% narrowing. The proximal third of the RCA contains an eccentric somewhat hazy appearing 50-70% narrowing. PDA is moderate in size and contains irregularities but no high-grade obstruction.  LEFT VENTRICULOGRAM: Left ventricular angiogram was done in the 30 RAO projection and revealed a mildly dilated cavity. The quality of the left ventriculogram is suboptimal due to hand injection. Anterior wall motion is mildly depressed. Inferior wall motion is moderately depressed. The estimated ejection fraction is 35%.   IMPRESSIONS: 1. Moderate three-vessel coronary artery disease with heavy three-vessel calcification. 2. The proximal to mid RCA contains an eccentric 50-70% narrowing. The proximal to mid circumflex contains 30-50% narrowing. There is diffuse 30-50% narrowing throughout the proximal and mid LAD. 3. LV enlargement with decreased systolic function and estimated ejection fraction of 35%. LV function appears improved compared to the echo results noted in the patient's clinical data.   RECOMMENDATION:   Further management per the treating team.  It would appear that aggressive blood pressure control with therapy that includes beta blockers and red and angiotensin inhibition will be quite helpful. It appears in LV function is already improving.. Follow kidney function with BMET in AM   BNP     Component Value Date/Time   BNP 433.8* 05/01/2014 1424    ProBNP No results found for: PROBNP   Education Assessment and Provision:  Detailed education and instructions provided on heart failure disease management including the following:  Signs and symptoms of Heart Failure When to call the physician Importance of daily weights Low sodium diet Fluid restriction Medication management Anticipated future follow-up appointments  Patient education given on each of the above topics.  Patient acknowledges understanding and acceptance of all instructions.  I spoke with Ms. Wageman regarding her HF diagnosis.  She lives alone in [place].  She recently relocated form [place] and has a daughter that lives in [place].  She is not driving since moving to [place]--yet says that her daughter will assist her to appointments.  She is able to teach back most topics listed above.  I did review a low sodium diet and high sodium foods to avoid.  She has a scale and understands the importance of daily weights.  I have encouraged her to call with questions after discharge.  Education Materials:  "Living Better With Heart Failure" Booklet, Daily Weight Tracker Tool   High Risk Criteria for Readmission and/or Poor Patient Outcomes:  (Recommend Follow-up with [facility] Failure Clinic)   EF <30%- Yes 25-30% with grade 1 dias dys  2 or more admissions in 6 months- No  Difficult  social situation- No  Demonstrates medication noncompliance-No   Barriers of Care:  Knowledge, compliance and health literacy  Discharge Planning:   Plans to discharge to home alone.  She will benefit from [facility] for ongoing education as well as compliance reinforcement.

## 2014-05-09 ENCOUNTER — Other Ambulatory Visit: Payer: Self-pay | Admitting: *Deleted

## 2014-05-09 NOTE — Patient Outreach (Signed)
Spoke with pt, HIPPA verified.  Pt states she is doing good, no sob now.  Pt inquired about walker she was suppose to receive to which RN CM provided contact number.   RN CM discussed [facility] services, plan to f/u  with weekly phone calls for the next 30 days.  Also, discussed  RN CM services, home visit  from a nurse to which pt was interested.   With f/u call next week on 4/6, plan to discuss scheduling home visit.       Addendum:  At 1:37pm- called pt back to provide her with number for [facility]- where walker was ordered, gave her number for [facility] which is for home care.   Also, informed pt per discharge summary to stop taking Ibuprofen  which pt said she did not know.

## 2014-05-10 ENCOUNTER — Telehealth (HOSPITAL_COMMUNITY): Payer: Self-pay | Admitting: Surgery

## 2014-05-10 NOTE — Telephone Encounter (Signed)
Heart Failure Nurse Navigator Post Discharge Telephone Call  Patient reports doing "well " since being discharged from the hospital recently..  She has not been weighing daily because she does not have a scale.  She plans to buy one today.  I encouraged her to do that and reinforced the importance of daily weights.  She denies SOB or increased swelling.  She has a follow-up appt on 05/20/14 at 0920am in the AHF clinic.  She says that her daughter will bring her and I verified that she knew the location.  I encouraged her to call back with any concerns or questions regarding her HF.

## 2014-05-15 ENCOUNTER — Other Ambulatory Visit: Payer: Self-pay | Admitting: *Deleted

## 2014-05-15 NOTE — Patient Outreach (Addendum)
Transition of care call #2:   Pt reports she has good and bad days, no sob or swelling.  Pt states Home health nurse came out yesterday and home health PT is coming also.   Pt states she did get a scale, weighing every day.  Pt states weight today was 198.8lbs, yesterday 197 something.  Pt states she has all of her medications, taking.   Pt states appetite is good, need to change and eat breakfast.  Pt states she is to see MD (Primary Care) tomorrow, heart MD 4/11.  Pt states she was suppose to get her walker Monday to which RN CM encouraged her to call the company.    As discussed with pt, plan to f/u again telephonically on 4/13 as part of ongoing transition of care, talk then about scheduling home visit.     Zara Chess.   [facility]  [phone]

## 2014-05-20 ENCOUNTER — Ambulatory Visit (HOSPITAL_COMMUNITY)
Admission: RE | Admit: 2014-05-20 | Discharge: 2014-05-20 | Disposition: A | Discharge status: Home / Self Care | Payer: Commercial Managed Care - HMO | Source: Ambulatory Visit | Attending: Cardiology | Admitting: Cardiology

## 2014-05-20 VITALS — BP 148/86 | HR 86 | Wt 202.0 lb

## 2014-05-20 DIAGNOSIS — Z7982 Long term (current) use of aspirin: Secondary | ICD-10-CM | POA: Diagnosis not present

## 2014-05-20 DIAGNOSIS — N183 Chronic kidney disease, stage 3 unspecified: Secondary | ICD-10-CM

## 2014-05-20 DIAGNOSIS — I429 Cardiomyopathy, unspecified: Secondary | ICD-10-CM | POA: Insufficient documentation

## 2014-05-20 DIAGNOSIS — R0683 Snoring: Secondary | ICD-10-CM | POA: Diagnosis not present

## 2014-05-20 DIAGNOSIS — I129 Hypertensive chronic kidney disease with stage 1 through stage 4 chronic kidney disease, or unspecified chronic kidney disease: Secondary | ICD-10-CM | POA: Insufficient documentation

## 2014-05-20 DIAGNOSIS — Z79899 Other long term (current) drug therapy: Secondary | ICD-10-CM | POA: Insufficient documentation

## 2014-05-20 DIAGNOSIS — I5022 Chronic systolic (congestive) heart failure: Secondary | ICD-10-CM | POA: Diagnosis not present

## 2014-05-20 DIAGNOSIS — I2510 Atherosclerotic heart disease of native coronary artery without angina pectoris: Secondary | ICD-10-CM | POA: Diagnosis not present

## 2014-05-20 DIAGNOSIS — I10 Essential (primary) hypertension: Secondary | ICD-10-CM

## 2014-05-20 DIAGNOSIS — N189 Chronic kidney disease, unspecified: Secondary | ICD-10-CM | POA: Insufficient documentation

## 2014-05-20 DIAGNOSIS — M109 Gout, unspecified: Secondary | ICD-10-CM | POA: Insufficient documentation

## 2014-05-20 DIAGNOSIS — I160 Hypertensive urgency: Secondary | ICD-10-CM

## 2014-05-20 DIAGNOSIS — Z8673 Personal history of transient ischemic attack (TIA), and cerebral infarction without residual deficits: Secondary | ICD-10-CM | POA: Diagnosis not present

## 2014-05-20 MED ORDER — SPIRONOLACTONE 25 MG PO TABS: 12.5000 mg | ORAL_TABLET | Freq: Every day | ORAL | Status: DC

## 2014-05-20 MED ORDER — ASPIRIN EC 81 MG PO TBEC: 81.0000 mg | DELAYED_RELEASE_TABLET | Freq: Every day | ORAL | Status: DC

## 2014-05-20 MED ORDER — CARVEDILOL 6.25 MG PO TABS: 6.2500 mg | ORAL_TABLET | Freq: Two times a day (BID) | ORAL | Status: DC

## 2014-05-20 NOTE — Patient Instructions (Signed)
Start Aspirin 81 mg daily  Stop Bystolic  Start Carvedilol 6.25 mg Twice daily   Start Spironolactone 12.5 mg (1/2 tab) daily  Labs in 2 weeks (bmet, bnp)  Your physician has recommended that you have a sleep study. This test records several body functions during sleep, including: brain activity, eye movement, oxygen and carbon dioxide blood levels, heart rate and rhythm, breathing rate and rhythm, the flow of air through your mouth and nose, snoring, body muscle movements, and chest and belly movement.  Your physician has requested that you have a renal artery duplex. During this test, an ultrasound is used to evaluate blood flow to the kidneys. Allow one hour for this exam. Do not eat after midnight the day before and avoid carbonated beverages. Take your medications as you usually do.  Your physician discussed the importance of regular exercise and recommended that you start or continue a regular exercise program for good health.  PLEASE BEGIN WALKING 15-20 MINUTES DAILY.  Your physician has requested that you regularly monitor and record your blood pressure readings at home. Please use the same machine at the same time of day to check your readings and record them to bring to your follow-up visit.  Your physician recommends that you schedule a follow-up appointment in: 1 month, please bring your BP reading and medications to this appointment.

## 2014-05-20 NOTE — Progress Notes (Signed)
Patient ID: Tara Dunn, female   DOB: 1940/12/29, 74 y.o.   MRN: [id number] PCP: Dr. Ernie Hew  74 y.o. with long history of HTN, CVAs, CKD, and recent admission with acute systolic CHF presents for cardiology followup.  Patient says that she has had high blood pressure since 15.  She moved to [place] from [place] in 2015.  In 3/16, she was admitted a hypertensive emergency and acute CHF.  CTA chest showed no PE.  Echo showed EF 25-30% with moderate LVH.  She was diuresed and had a cardiac catheterization given extensive coronary calcification seen on CTA chest.  Cath showed diffuse moderate CAD, worst was 50-70% mRCA stenosis.   She returns for followup today.  Overall doing well.  Has only used Lasix one time since discharge.  Weight has been stable. She walks with a cane for stability.  No chest pain.  No orthopnea/PND.  She does not know if she snores but she is sleepy during the day.   ECG: NSR, LVH  Labs (3/16): K 4.2, creatinine 1.44, HCT 39.7  PMH: 1. CVA: 3 prior CVAs, last in 1998.  Thought to be related to HTN. 2. HTN: Long-standing, says she has been hypertensive since age 74.  40. CKD: Suspect related to HTN. 4. Breast cancer: 2009, left mastectomy with chemotherapy.  5. Gout 6. Obese 7. TAH 8. CAD: LHC (3/16) with moderate diffuse disease, worst lesion being 50-70% mid RCA.  9. Cardiomyopathy: Echo (3/16) with EF 25-30%, moderate LVH.   SH: From [place], moved to [place] in 2015 to be near daughter.  Single.  Quit smoking 2009.    FH: HTN runs in family.  Father with MI.   ROS: All systems reviewed and negative except as per HPI.   Current Outpatient Prescriptions  Medication Sig Dispense Refill  . allopurinol (ZYLOPRIM) 100 MG tablet Take 100 mg by mouth daily.  0  . amLODipine (NORVASC) 5 MG tablet Take 1 tablet (5 mg total) by mouth daily. 30 tablet 0  . atorvastatin (LIPITOR) 40 MG tablet Take 1 tablet (40 mg total) by mouth daily. 30 tablet 0  . furosemide (LASIX) 20 MG  tablet Take 1 tablet (20 mg total) by mouth as needed for fluid (Weight gain of more than 5 pounds.). 30 tablet 0  . isosorbide-hydrALAZINE (BIDIL) 20-37.5 MG per tablet Take 2 tablets by mouth 3 (three) times daily. 30 tablet 0  . omeprazole (PRILOSEC) 40 MG capsule Take 40 mg by mouth daily.  0  . aspirin EC 81 MG tablet Take 1 tablet (81 mg total) by mouth daily. 90 tablet 3  . carvedilol (COREG) 6.25 MG tablet Take 1 tablet (6.25 mg total) by mouth 2 (two) times daily. 60 tablet 3  . spironolactone (ALDACTONE) 25 MG tablet Take 0.5 tablets (12.5 mg total) by mouth daily. 15 tablet 3   No current facility-administered medications for this encounter.   BP 148/86 mmHg  Pulse 86  Wt 202 lb (91.627 kg)  SpO2 97% General: NAD Neck: No JVD, no thyromegaly or thyroid nodule.  Lungs: Clear to auscultation bilaterally with normal respiratory effort. CV: Nondisplaced PMI.  Heart regular S1/S2, +S4, no murmur.  No peripheral edema.  No carotid bruit.  Normal pedal pulses.  Abdomen: Soft, nontender, no hepatosplenomegaly, no distention.  Skin: Intact without lesions or rashes.  Neurologic: Alert and oriented x 3.  Psych: Normal affect. Extremities: No clubbing or cyanosis.  HEENT: Normal.   Assessment/Plan: 1. HTN: Long-standing, severe, poorly controlled  over time.  Has had history of CVA and CKD.  BP remains high, she is out of Bystolic.  - Continue Bidil 2 tabs tid and amlodipine.  - Will stop Bystolic and use Coreg 0.96 mg bid.  - Add spironolactone 12.5 mg daily, may help with resistant HTN.  - Daytime sleepiness: will arrange for sleep study as OSA may be perpetuating HTN.  - Will arrange renal artery dopplers.  2. CAD: Moderate diffuse CAD, no severe obstruction.  This did not cause her cardiomyopathy.  No chest pain.  - Needs to start ASA 81 - Continue atorvastatin with lipids/LFTs in 2 months.  3. CKD: Creatinine 1.44 on last check.  BMET in 10 days after starting spironolactone.   4. 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  She does not look volume overloaded on exam, weight stable.  - Take Lasix 20 mg daily if weight 201 or above.  - Continue Bidil 2 tabs tid.  - Stop Bystolic (out of it anyway) and start Coreg 6.25 mg bid.  - Add spironolactone 12.5 mg daily with BMET/BNP in 10 days.   Followup in 1 month with home BP readings.   Loralie Champagne 05/20/2014 10:26 AM

## 2014-05-22 ENCOUNTER — Other Ambulatory Visit: Payer: Self-pay | Admitting: *Deleted

## 2014-05-22 NOTE — Patient Outreach (Signed)
Transition of care call (week 3): Pt reports saw Primary Care MD 4/7,good report and to see again tomorrow.  Pt states saw heart MD 4/11, to f/u again in 2 weeks and to have sonogram 4/15.   RN CM reviewed with pt the  med changes made  by heart MD as viewed in Epic/compliance with medications to which pt was not aware needed to start Aspirin 81 mg (take daily).   Pt states weight today is 201.6lbs,yesterday 200.8 lbs, 4/11- 200 lbs.  RN CM reviewed with pt MD order to take Lasix 20 mg if weight > 201 lbs. To which pt said did not take one today, thought MD said >205lbs.   Pt states she will take the Lasix.    Pt states HH RN is still coming.   Discussed with pt doing a home visit to which pt agreed, home visit scheduled 4/22.      Zara Chess.   [facility]  [phone]

## 2014-05-24 ENCOUNTER — Ambulatory Visit (HOSPITAL_COMMUNITY): Payer: Commercial Managed Care - HMO | Attending: Cardiovascular Disease | Admitting: Cardiology

## 2014-05-24 DIAGNOSIS — N183 Chronic kidney disease, stage 3 unspecified: Secondary | ICD-10-CM

## 2014-05-24 DIAGNOSIS — I2510 Atherosclerotic heart disease of native coronary artery without angina pectoris: Secondary | ICD-10-CM | POA: Diagnosis not present

## 2014-05-24 DIAGNOSIS — Z8673 Personal history of transient ischemic attack (TIA), and cerebral infarction without residual deficits: Secondary | ICD-10-CM | POA: Insufficient documentation

## 2014-05-24 DIAGNOSIS — I10 Essential (primary) hypertension: Secondary | ICD-10-CM | POA: Insufficient documentation

## 2014-05-24 DIAGNOSIS — I160 Hypertensive urgency: Secondary | ICD-10-CM

## 2014-05-24 DIAGNOSIS — Z87891 Personal history of nicotine dependence: Secondary | ICD-10-CM | POA: Insufficient documentation

## 2014-05-24 NOTE — Progress Notes (Signed)
Renal artery duplex performed  

## 2014-05-29 ENCOUNTER — Other Ambulatory Visit: Payer: Self-pay | Admitting: *Deleted

## 2014-05-29 NOTE — Patient Outreach (Signed)
Transition of care call (week 4): Spoke with pt as needed to cancel initial home visit for 4/22, rescheduled for 4/27.  Pt states her weights are doing good, 197-198 lbs with 2 days 200 lbs. (not gaining 3 lbs in a day, 5 lbs in a week).  Pt states taking all medications.   Pt states to see someone on 5/5 and with review of pt's schedule, relayed to pt to have lab done on 4/27.    RN CM to see pt on 4/27 for initial home visit.     Zara Chess.   [place] [facility]  [phone]

## 2014-05-29 NOTE — Patient Outreach (Signed)
Attempt made to contact pt, check on status, reschedule 4/22 home visit.   HIPPA compliant voice message left with contact number.    Zara Chess.   [place] [facility]  [phone]

## 2014-05-31 ENCOUNTER — Ambulatory Visit: Payer: Commercial Managed Care - HMO | Admitting: *Deleted

## 2014-06-05 ENCOUNTER — Encounter: Payer: Self-pay | Admitting: *Deleted

## 2014-06-05 ENCOUNTER — Other Ambulatory Visit (HOSPITAL_COMMUNITY)
Admission: RE | Admit: 2014-06-05 | Discharge: 2014-06-05 | Disposition: A | Discharge status: Home / Self Care | Payer: Commercial Managed Care - HMO | Source: Ambulatory Visit | Attending: Cardiology | Admitting: Cardiology

## 2014-06-05 ENCOUNTER — Other Ambulatory Visit: Payer: Self-pay | Admitting: *Deleted

## 2014-06-05 ENCOUNTER — Other Ambulatory Visit (HOSPITAL_COMMUNITY): Payer: Commercial Managed Care - HMO

## 2014-06-05 DIAGNOSIS — N189 Chronic kidney disease, unspecified: Secondary | ICD-10-CM | POA: Diagnosis present

## 2014-06-05 LAB — BASIC METABOLIC PANEL
ANION GAP: 13 (ref 5–15)
BUN: 37 mg/dL — ABNORMAL HIGH (ref 6–23)
CHLORIDE: 102 mmol/L (ref 96–112)
CO2: 21 mmol/L (ref 19–32)
Calcium: 10.2 mg/dL (ref 8.4–10.5)
Creatinine, Ser: 2.01 mg/dL — ABNORMAL HIGH (ref 0.50–1.10)
GFR calc Af Amer: 27 mL/min — ABNORMAL LOW (ref >90)
GFR calc non Af Amer: 23 mL/min — ABNORMAL LOW (ref >90)
Glucose, Bld: 102 mg/dL — ABNORMAL HIGH (ref 70–99)
Potassium: 4.1 mmol/L (ref 3.5–5.1)
Sodium: 136 mmol/L (ref 135–145)

## 2014-06-05 LAB — BRAIN NATRIURETIC PEPTIDE: B NATRIURETIC PEPTIDE 5: 13.8 pg/mL (ref 0.0–100.0)

## 2014-06-05 NOTE — Patient Outreach (Signed)
[facility]) Care Management   06/05/2014  Tara Dunn Nov 09, 1940 [id number]  Tara Dunn is an 74 y.o. female  Subjective:  Pt reports she moved from [place], [place] 7 months ago, daughter  lives close by, helps as needed.  Pt states weighing daily/recording. Pt states her recent hospitalization is when she found out had HF.  Pt states went to MD office today to have labs done.  Pt  states she is to see heart MD 5/5.  Pt states had a [place] sandwich from [place] today, might be reason BP is high.  Pt states  HH RN and HH PT are still coming, [facility] PT one more visit, nurse not sure how many left.  Pt states has pain to toes and ball of left  foot- coming from gout.     Objective:  BP checked with pt's machine- 159/114, recheck- 144/100.  RN CM check- 140/84.                       Pt's weight today 198.2lbs, ranges 196.6lbs to 201.6lbs. (no gain of 3lbs in a day,5lbs in a week) ROS  Physical Exam  Constitutional: She is oriented to person, place, and time. She appears well-developed and well-nourished.  Cardiovascular: Normal rate.   Respiratory: Breath sounds normal.  GI: Soft. Bowel sounds are normal.  Neurological: She is alert and oriented to person, place, and time.  Skin: Skin is warm and dry.  Psychiatric: She has a normal mood and affect.    Current Medications:   Current Outpatient Prescriptions  Medication Sig Dispense Refill  . allopurinol (ZYLOPRIM) 100 MG tablet Take 100 mg by mouth daily.  0  . amLODipine (NORVASC) 5 MG tablet Take 1 tablet (5 mg total) by mouth daily. 30 tablet 0  . aspirin EC 81 MG tablet Take 1 tablet (81 mg total) by mouth daily. 90 tablet 3  . atorvastatin (LIPITOR) 40 MG tablet Take 1 tablet (40 mg total) by mouth daily. 30 tablet 0  . carvedilol (COREG) 6.25 MG tablet Take 1 tablet (6.25 mg total) by mouth 2 (two) times daily. 60 tablet 3  . furosemide (LASIX) 20 MG tablet Take 1 tablet (20 mg total) by mouth as needed for fluid  (Weight gain of more than 5 pounds.). 30 tablet 0  . isosorbide-hydrALAZINE (BIDIL) 20-37.5 MG per tablet Take 2 tablets by mouth 3 (three) times daily. 30 tablet 0  . omeprazole (PRILOSEC) 40 MG capsule Take 40 mg by mouth daily.  0  . spironolactone (ALDACTONE) 25 MG tablet Take 0.5 tablets (12.5 mg total) by mouth daily. 15 tablet 3   No current facility-administered medications for this visit.    Fall/Depression Screening:    PHQ 2/9 Scores 06/05/2014  PHQ - 2 Score 0    Assessment:  HF- weights stable/recording.                           HTN: BP with pt's machine runs high compared to RN CM                            Plan:  Pt to continue to  Weigh daily, record in [facility] calendar provided.            Pt to check BP daily/record/call MD if BP >140/90.  Plan to close Transition of care program in 2 days (30 days post discharge)            Plan to provide pt with community nurse case management services, next home visit scheduled                  For 5/27.            At next home visit, enroll pt in HF CM program, include monitoring BP as one of goals.             Plan to inform MD of [facility] involvement. [facility] CM Care Plan Problem One        Patient Outreach from 06/05/2014 in [place]   Patient Outreach Telephone from 05/09/2014 in [place] Problem One    Recent hospitalization for Heart Failure    Care Plan for Problem One    Active   [facility] Long Term Goal Start Date    05/09/14   Interventions for Problem One Long Term Goal    Discussed with pt importance of f/u with Primary Care MD 7-10 days post discharge, pick up meds from pharmacy, purchase scale    [facility] CM Short Term Goal #1 (0-30 days)     Pick up 2 new rx meds from the pharmacy within 1-2 days    [facility] CM Short Term Goal #1 Start Date    05/09/14   [facility] CM Short Term Goal #1 Met Date  -- [pt reports on 4/6 had all meds. ]     Interventions for Short Term Goal #1    -- [Discussed with  pt importance of picking up new meds, start taking ]   [facility] CM Short Term Goal #2 (0-30 days)    F/u with Primary Care MD in the next 7-10 days post discharge    [facility] CM Short Term Goal #2 Start Date    05/09/14   [facility] CM Short Term Goal #2 Met Date  05/15/14     Interventions for Short Term Goal #2    -- [Discussed with pt the importance of f/u with Primary Care MD within the next 7-10 days post discharge. ]   [facility] CM Short Term Goal #3 (0-30 days)    Purchase a scale within the next 3-4 days    [facility] CM Short Term Goal #3 Start Date    05/09/14   [facility] CM Short Term Goal #3 Met Date  05/15/14     Interventions for Short Tern Goal #3    -- [Discussed with pt importance of having a scale to weigh, monitor weight gain for HF. ]                   Note:  Added pt to HF program with goals that included managing BP but erased it in the care plan section  because pt will be in  Transition of care program for 2 more days, can only be in one program at a time.    Plan to update CM program and care plan at next visit.    Zara Chess.   [place] [facility]  [phone]

## 2014-06-13 ENCOUNTER — Ambulatory Visit (HOSPITAL_COMMUNITY)
Admission: RE | Admit: 2014-06-13 | Discharge: 2014-06-13 | Disposition: A | Discharge status: Home / Self Care | Payer: Commercial Managed Care - HMO | Source: Ambulatory Visit | Attending: Internal Medicine | Admitting: Internal Medicine

## 2014-06-13 VITALS — BP 142/88 | HR 73 | Wt 202.8 lb

## 2014-06-13 DIAGNOSIS — I5022 Chronic systolic (congestive) heart failure: Secondary | ICD-10-CM | POA: Insufficient documentation

## 2014-06-13 DIAGNOSIS — Z7982 Long term (current) use of aspirin: Secondary | ICD-10-CM | POA: Diagnosis not present

## 2014-06-13 DIAGNOSIS — Z79899 Other long term (current) drug therapy: Secondary | ICD-10-CM | POA: Insufficient documentation

## 2014-06-13 DIAGNOSIS — N179 Acute kidney failure, unspecified: Secondary | ICD-10-CM

## 2014-06-13 DIAGNOSIS — N183 Chronic kidney disease, stage 3 unspecified: Secondary | ICD-10-CM

## 2014-06-13 DIAGNOSIS — Z8673 Personal history of transient ischemic attack (TIA), and cerebral infarction without residual deficits: Secondary | ICD-10-CM | POA: Insufficient documentation

## 2014-06-13 DIAGNOSIS — I129 Hypertensive chronic kidney disease with stage 1 through stage 4 chronic kidney disease, or unspecified chronic kidney disease: Secondary | ICD-10-CM | POA: Diagnosis not present

## 2014-06-13 DIAGNOSIS — I10 Essential (primary) hypertension: Secondary | ICD-10-CM | POA: Insufficient documentation

## 2014-06-13 DIAGNOSIS — M109 Gout, unspecified: Secondary | ICD-10-CM | POA: Diagnosis not present

## 2014-06-13 DIAGNOSIS — Z87891 Personal history of nicotine dependence: Secondary | ICD-10-CM | POA: Diagnosis not present

## 2014-06-13 DIAGNOSIS — I2510 Atherosclerotic heart disease of native coronary artery without angina pectoris: Secondary | ICD-10-CM | POA: Diagnosis not present

## 2014-06-13 DIAGNOSIS — N189 Chronic kidney disease, unspecified: Secondary | ICD-10-CM | POA: Insufficient documentation

## 2014-06-13 LAB — BASIC METABOLIC PANEL
Anion gap: 8 (ref 5–15)
BUN: 29 mg/dL — ABNORMAL HIGH (ref 6–20)
CALCIUM: 10.5 mg/dL — AB (ref 8.9–10.3)
CO2: 23 mmol/L (ref 22–32)
Chloride: 108 mmol/L (ref 101–111)
Creatinine, Ser: 1.56 mg/dL — ABNORMAL HIGH (ref 0.44–1.00)
GFR calc Af Amer: 37 mL/min — ABNORMAL LOW (ref >60)
GFR calc non Af Amer: 32 mL/min — ABNORMAL LOW (ref >60)
GLUCOSE: 102 mg/dL — AB (ref 70–99)
POTASSIUM: 4.7 mmol/L (ref 3.5–5.1)
SODIUM: 139 mmol/L (ref 135–145)

## 2014-06-13 MED ORDER — FUROSEMIDE 20 MG PO TABS: 20.0000 mg | ORAL_TABLET | ORAL | Status: DC | PRN

## 2014-06-13 MED ORDER — ISOSORB DINITRATE-HYDRALAZINE 20-37.5 MG PO TABS: 2.0000 | ORAL_TABLET | Freq: Three times a day (TID) | ORAL | Status: DC

## 2014-06-13 MED ORDER — AMLODIPINE BESYLATE 10 MG PO TABS: 10.0000 mg | ORAL_TABLET | Freq: Every day | ORAL | Status: DC

## 2014-06-13 MED ORDER — ATORVASTATIN CALCIUM 40 MG PO TABS: 40.0000 mg | ORAL_TABLET | Freq: Every day | ORAL | Status: DC

## 2014-06-13 MED ORDER — AMLODIPINE BESYLATE 10 MG PO TABS
10.0000 mg | ORAL_TABLET | Freq: Every day | ORAL | Status: DC
Start: 2014-06-13 — End: 2014-10-03

## 2014-06-13 NOTE — Addendum Note (Signed)
Encounter addended by: Micki Riley, RN on: 06/13/2014 12:03 PM<BR>     Documentation filed: Orders

## 2014-06-13 NOTE — Progress Notes (Signed)
Patient ID: Tara Dunn, female   DOB: 12-23-1940, 74 y.o.   MRN: [id number] PCP: Dr. Ernie Hew  74 yo with long history of HTN, CVAs, CKD, and systolic CHF.  She moved to [place] from [place] in 2015.    In 3/16, she was admitted a hypertensive emergency and acute CHF.  CTA chest showed no PE.  Echo showed EF 25-30% with moderate LVH.  She was diuresed and had a cardiac catheterization given extensive coronary calcification seen on CTA chest.  Cath showed diffuse moderate CAD, worst was 50-70% mRCA stenosis.   Saw Dr. Aundra Dubin last month. Doing well but BP still high. Bystolic switched to carvedilol and spironolactone added.  She returns for followup today.  Overall doing well.  Says she feels good. Breathing much better.  "I do everything I have to do at home." Exercises on treadmill and bike. Has only used Lasix 3x since last visit if feet swell.  Weight has been stable. She walks with a cane for stability.  No chest pain.  No orthopnea/PND. Has HHRN who checks BP. SBP at home usually over 160.   ECG: NSR, LVH  Labs (3/16): K 4.2, creatinine 1.44, HCT 39.7 Labs (06/05/14): K 4.1, creatinine 2.01  PMH: 1. CVA: 3 prior CVAs, last in 1998.  Thought to be related to HTN. 2. HTN: Long-standing, says she has been hypertensive since age 40.  32. CKD: Suspect related to HTN. 4. Breast cancer: 2009, left mastectomy with chemotherapy.  5. Gout 6. Obese 7. TAH 8. CAD: LHC (3/16) with moderate diffuse disease, worst lesion being 50-70% mid RCA.  9. Cardiomyopathy: Echo (3/16) with EF 25-30%, moderate LVH.   SH: From [place], moved to [place] in 2015 to be near daughter.  Single.  Quit smoking 2009.    FH: HTN runs in family.  Father with MI.   ROS: All systems reviewed and negative except as per HPI.   Current Outpatient Prescriptions  Medication Sig Dispense Refill  . allopurinol (ZYLOPRIM) 100 MG tablet Take 100 mg by mouth daily.  0  . amLODipine (NORVASC) 5 MG tablet Take 1 tablet (5 mg total)  by mouth daily. 30 tablet 0  . aspirin EC 81 MG tablet Take 1 tablet (81 mg total) by mouth daily. 90 tablet 3  . atorvastatin (LIPITOR) 40 MG tablet Take 1 tablet (40 mg total) by mouth daily. 30 tablet 0  . carvedilol (COREG) 6.25 MG tablet Take 1 tablet (6.25 mg total) by mouth 2 (two) times daily. 60 tablet 3  . furosemide (LASIX) 20 MG tablet Take 1 tablet (20 mg total) by mouth as needed for fluid (Weight gain of more than 5 pounds.). 30 tablet 0  . isosorbide-hydrALAZINE (BIDIL) 20-37.5 MG per tablet Take 2 tablets by mouth 3 (three) times daily. 30 tablet 0  . omeprazole (PRILOSEC) 40 MG capsule Take 40 mg by mouth daily.  0  . spironolactone (ALDACTONE) 25 MG tablet Take 0.5 tablets (12.5 mg total) by mouth daily. 15 tablet 3   No current facility-administered medications for this encounter.   BP 142/88 mmHg  Pulse 73  Wt 202 lb 12.8 oz (91.989 kg)  SpO2 98% General: NAD Neck: No JVD, no thyromegaly or thyroid nodule.  Lungs: Clear to auscultation bilaterally with normal respiratory effort. CV: Nondisplaced PMI.  Heart regular S1/S2, +S4, no murmur.  No peripheral edema.  No carotid bruit.  Normal pedal pulses.  Abdomen: Soft, nontender, no hepatosplenomegaly, no distention.  Skin: Intact without lesions  or rashes.  Neurologic: Alert and oriented x 3.  Psych: Normal affect. Extremities: No clubbing or cyanosis.  HEENT: Normal.   Assessment/Plan: 1. HTN: Long-standing, severe, poorly controlled over time.  Has had history of CVA and CKD.  BP remains high, she is out of Bystolic.  - Continue Bidil 2 tabs tid.  -  Increase amlodipine to 10 daily.  - Continue Coreg 6.25 mg bid.  - Continue spironolactone 12.5 mg daily. Will not increase yet due to recent rise in creatinine - Daytime sleepiness: will arrange for sleep study as OSA may be perpetuating HTN.  - Will arrange renal artery dopplers.  2. CAD: Moderate diffuse CAD, no severe obstruction.  This did not cause her  cardiomyopathy.  No chest pain.  - Continue ASA 81 - Continue atorvastatin with lipids/LFTs in 2 months.  3. CKD: Creatinine 2.0 on last check.  Recheck BMET today 4. 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  She does not look volume overloaded on exam, weight stable.  - Take Lasix 20 mg daily if weight 201 or above.  - Continue Bidil 2 tabs tid.  - Continue Coreg 6.25 mg bid.  - Continue spironolactone 12.5 mg daily with BMET today - If creatinine comes back down will try low-dose Entresto at next visit  - Check echo at next visit. If EF < 35% consider ICD   Followup in 1 month with ECHO ad report of home BP readings (I assigned this to her as her homework).   BensimhonQuillian Quince 06/13/2014 11:24 AM

## 2014-06-13 NOTE — Addendum Note (Signed)
Encounter addended by: Effie Berkshire, RN on: 06/13/2014 11:53 AM<BR>     Documentation filed: Visit Diagnoses, Dx Association, Patient Instructions Section, Orders

## 2014-06-13 NOTE — Patient Instructions (Signed)
Increase Amlodipine to 10 mg daily Labs today Follow-up in 1 month with Echocardiogram  And please bring BP readings to next appt.

## 2014-07-04 ENCOUNTER — Other Ambulatory Visit (HOSPITAL_COMMUNITY): Payer: Self-pay | Admitting: Cardiology

## 2014-07-04 DIAGNOSIS — I5022 Chronic systolic (congestive) heart failure: Secondary | ICD-10-CM

## 2014-07-05 ENCOUNTER — Other Ambulatory Visit: Payer: Self-pay | Admitting: *Deleted

## 2014-07-05 VITALS — BP 138/82 | HR 73 | Resp 20 | Ht 64.0 in | Wt 196.8 lb

## 2014-07-05 DIAGNOSIS — I5022 Chronic systolic (congestive) heart failure: Secondary | ICD-10-CM

## 2014-07-06 NOTE — Patient Outreach (Signed)
[place] [facility]) Care Management  [facility] Care Manager  07/06/2014   Tara Dunn [birth year] [id number]  Subjective:   Objective:  Filed Vitals:   07/05/14 1359  BP: 138/82  Pulse:   Resp:      Current Medications:  Current Outpatient Prescriptions  Medication Sig Dispense Refill  . allopurinol (ZYLOPRIM) 100 MG tablet Take 100 mg by mouth daily.  0  . amLODipine (NORVASC) 10 MG tablet Take 1 tablet (10 mg total) by mouth daily. 30 tablet 3  . aspirin EC 81 MG tablet Take 1 tablet (81 mg total) by mouth daily. 90 tablet 3  . atorvastatin (LIPITOR) 40 MG tablet Take 1 tablet (40 mg total) by mouth daily. 30 tablet 0  . carvedilol (COREG) 6.25 MG tablet Take 1 tablet (6.25 mg total) by mouth 2 (two) times daily. 60 tablet 3  . furosemide (LASIX) 20 MG tablet Take 1 tablet (20 mg total) by mouth as needed for fluid (Weight gain of more than 5 pounds.). 30 tablet 0  . isosorbide-hydrALAZINE (BIDIL) 20-37.5 MG per tablet Take 2 tablets by mouth 3 (three) times daily. 30 tablet 0  . omeprazole (PRILOSEC) 40 MG capsule Take 40 mg by mouth daily.  0  . spironolactone (ALDACTONE) 25 MG tablet Take 0.5 tablets (12.5 mg total) by mouth daily. 15 tablet 3   No current facility-administered medications for this visit.     Assessment:   Plan:

## 2014-07-08 NOTE — Patient Outreach (Addendum)
[place] [facility]) Care Management  [facility] Care Manager  07/05/14   Hema Kam 1941-01-14 [id number]  Subjective:  Pt reports saw Dr. Ernie Hew, received good report.  Pt states her BP and weight are up and  Down.  Pt states she saw Dr. Tempie Hoist (4/5), MD is more concerned about her BP, wants her to check it Every day/record/bring in results 5/31.  Pt states her weight today is 196.8lbs, ranges 195- 200.8 lbs.  Pt states She has taken 4 Lasix since her hospital discharge, took one last night for swelling- swelling down today.  Pt states today she is dealing with pain in left foot (gout).    Objective:  Vitals= BP 154/100 left arm, right arm- 138/82 with nurse's cuff, 163/114 with pt's (?accuracy).                      Lungs clear, no c/o sob, chest pain.  A & O x 3.                                 Current Medications:  Current Outpatient Prescriptions  Medication Sig Dispense Refill  . allopurinol (ZYLOPRIM) 100 MG tablet Take 100 mg by mouth daily.  0  . amLODipine (NORVASC) 10 MG tablet Take 1 tablet (10 mg total) by mouth daily. 30 tablet 3  . aspirin EC 81 MG tablet Take 1 tablet (81 mg total) by mouth daily. 90 tablet 3  . atorvastatin (LIPITOR) 40 MG tablet Take 1 tablet (40 mg total) by mouth daily. 30 tablet 0  . carvedilol (COREG) 6.25 MG tablet Take 1 tablet (6.25 mg total) by mouth 2 (two) times daily. 60 tablet 3  . furosemide (LASIX) 20 MG tablet Take 1 tablet (20 mg total) by mouth as needed for fluid (Weight gain of more than 5 pounds.). 30 tablet 0  . isosorbide-hydrALAZINE (BIDIL) 20-37.5 MG per tablet Take 2 tablets by mouth 3 (three) times daily. 30 tablet 0  . omeprazole (PRILOSEC) 40 MG capsule Take 40 mg by mouth daily.  0  . spironolactone (ALDACTONE) 25 MG tablet Take 0.5 tablets (12.5 mg total) by mouth daily. 15 tablet 3   No current facility-administered medications for this visit.     Assessment:  HF- weights stable, trace edema in bilateral  ankles.                            HTN- BP higher in right arm, ? Accuracy of pt's BP machine- need to get new one.   Plan:    Pt  To  Purchase new BP machine prior to f/u with Dr. Tempie Hoist, check daily/record/bring                     Results to MD visit.              Pt to weigh daily/record/take Lasix only with weight gain of 5 lbs. - call MD if weight gain of  3lbs                      In a day.              RN CM to continue to provide community nurse case management services, next h/v 6/27   [facility] CM Care Plan Problem One  Patient Outreach from 07/05/2014 in [place] for Problem One  Active    [place] Problem Two        Patient Outreach from 07/05/2014 in [place] Problem Two  Better understanding of [place] for Problem Two  Active   Interventions for Problem Two Long Term Goal   -- [Provided pt with Living with Heart Failure, reviewed the 3 zones, when to call MD ]   [facility] Long Term Goal (31-90) days  Pt would have a better understanding of Heart Failure (zones)  in the next 60 days    [facility] Long Term Goal Start Date  07/05/14   [facility] CM Short Term Goal #1 (0-30 days)  Medication issue- Pt would take Lasix as ordered (weight gain of 5 lbs) in the next 30 days    [facility] CM Short Term Goal #1 Start Date  07/05/14   Interventions for Short Term Goal #2   -- [5/27 discussed with pt importance of weighing daily/record/take Lasix as ordered (if weight gain of 5 lbs, not if see edema)., [facility] calendar provided to record weights. ]    [facility] CM Care Plan Problem Three        Patient Outreach from 07/05/2014 in [place] Problem Three  Elevated BP    Care Plan for Problem Three  Active   [facility] CM Short Term Goal #1 (0-30 days)  Pt would purchase new BP machine within the next 7 days    [facility] CM Short Term Goal #1 Start Date  07/05/14   Interventions for Short Term Goal #1  -- [5/27 - discussed  with pt accuracy of current BP machine, need to get a new one  in order to check BP as ordered by MD]   [facility] CM Short Term Goal #2 (0-30 days)  Pt to check BP daily/record within the next 30 days    [facility] CM Short Term Goal #2 Start Date  07/05/14   Interventions for Short Term Goal #2  5/27- Discussed with pt importance of checking BP with hx of HTN,stroke.       Zara Chess.   [place] Care Management  [phone]

## 2014-07-11 ENCOUNTER — Encounter (HOSPITAL_COMMUNITY): Payer: Self-pay

## 2014-07-11 ENCOUNTER — Ambulatory Visit (HOSPITAL_COMMUNITY)
Admission: RE | Admit: 2014-07-11 | Discharge: 2014-07-11 | Disposition: A | Discharge status: Home / Self Care | Payer: Commercial Managed Care - HMO | Source: Ambulatory Visit | Attending: Internal Medicine | Admitting: Internal Medicine

## 2014-07-11 ENCOUNTER — Ambulatory Visit (HOSPITAL_BASED_OUTPATIENT_CLINIC_OR_DEPARTMENT_OTHER)
Admission: RE | Admit: 2014-07-11 | Discharge: 2014-07-11 | Disposition: A | Discharge status: Home / Self Care | Payer: Commercial Managed Care - HMO | Source: Ambulatory Visit | Attending: Cardiology | Admitting: Cardiology

## 2014-07-11 VITALS — BP 126/80 | HR 68 | Wt 201.8 lb

## 2014-07-11 DIAGNOSIS — N183 Chronic kidney disease, stage 3 unspecified: Secondary | ICD-10-CM

## 2014-07-11 DIAGNOSIS — I5022 Chronic systolic (congestive) heart failure: Secondary | ICD-10-CM

## 2014-07-11 DIAGNOSIS — I10 Essential (primary) hypertension: Secondary | ICD-10-CM | POA: Diagnosis not present

## 2014-07-11 DIAGNOSIS — I517 Cardiomegaly: Secondary | ICD-10-CM | POA: Insufficient documentation

## 2014-07-11 DIAGNOSIS — I509 Heart failure, unspecified: Secondary | ICD-10-CM | POA: Diagnosis present

## 2014-07-11 MED ORDER — SPIRONOLACTONE 25 MG PO TABS: 25.0000 mg | ORAL_TABLET | Freq: Every day | ORAL | Status: DC

## 2014-07-11 MED ORDER — ISOSORB DINITRATE-HYDRALAZINE 20-37.5 MG PO TABS: 2.0000 | ORAL_TABLET | Freq: Three times a day (TID) | ORAL | Status: DC

## 2014-07-11 MED ORDER — LISINOPRIL 5 MG PO TABS: 5.0000 mg | ORAL_TABLET | Freq: Every day | ORAL | Status: DC

## 2014-07-11 MED ORDER — CARVEDILOL 12.5 MG PO TABS: 12.5000 mg | ORAL_TABLET | Freq: Two times a day (BID) | ORAL | Status: DC

## 2014-07-11 NOTE — Patient Instructions (Signed)
Increase Carvedilol to 12.5 mg .bid, you can take 2 of your 6.25 mg tablets .bid until you run out then we have sent you in a new prescription for 12.5 mg tablets  Start Lisinopril 5 mg daily  Labs in 10 days  Your physician has requested that you have a cardiac MRI. Cardiac MRI uses a computer to create images of your heart as its beating, producing both still and moving pictures of your heart and major blood vessels. For further information please visit [email or web address] Please follow the instruction sheet given to you today for more information.  IN 3 MONTHS.  Your physician recommends that you schedule a follow-up appointment in: 6 weeks

## 2014-07-11 NOTE — Progress Notes (Signed)
  Echocardiogram 2D Echocardiogram has been performed.  Tara Dunn FRANCES 07/11/2014, 11:47 AM

## 2014-07-13 NOTE — Progress Notes (Signed)
Patient ID: Tara Dunn, female   DOB: [birth year], 74 y.o.   MRN: [id number] PCP: Dr. Ernie Hew  74 yo with long history of HTN, CVAs, CKD, and systolic CHF.  She moved to [place] from [place] in 2015.    In 3/16, she was admitted a hypertensive emergency and acute CHF.  CTA chest showed no PE.  Echo showed EF 25-30% with moderate LVH.  She was diuresed and had a cardiac catheterization given extensive coronary calcification seen on CTA chest.  Cath showed diffuse moderate CAD, worst was 50-70% mRCA stenosis.   Doing well overall.  No exertional dyspnea, no chest pain.  No orthopnea/PND. Weight stable.  SBP 140s-150s at home.    Echo was done today and reviewed.  EF 35-40%, moderate LVH, diffuse hypokinesis with septal-lateral dyssynchrony, normal RV size and systolic function.   Labs (3/16): K 4.2, creatinine 1.44, HCT 39.7 Labs (06/05/14): K 4.1, creatinine 2.01 Labs (5/16): K 4.7, creatinine 1.56  PMH: 1. CVA: 3 prior CVAs, last in 1998.  Thought to be related to HTN. 2. HTN: Long-standing, says she has been hypertensive since age 52. Renal artery dopplers (4/16) were normal.  3. CKD: Suspect related to HTN. 4. Breast cancer: 2009, left mastectomy with chemotherapy.  5. Gout 6. Obese 7. TAH 8. CAD: LHC (3/16) with moderate diffuse disease, worst lesion being 50-70% mid RCA.  9. Cardiomyopathy: Echo (3/16) with EF 25-30%, moderate LVH.  Echo (6/16) with EF 35-40%, moderate LVH, diffuse hypokinesis with septal-lateral dyssynchrony.   SH: From [place], moved to [place] in 2015 to be near daughter.  Single.  Quit smoking 2009.    FH: HTN runs in family.  Father with MI.   ROS: All systems reviewed and negative except as per HPI.   Current Outpatient Prescriptions  Medication Sig Dispense Refill  . allopurinol (ZYLOPRIM) 100 MG tablet Take 100 mg by mouth daily.  0  . amLODipine (NORVASC) 10 MG tablet Take 1 tablet (10 mg total) by mouth daily. 30 tablet 3  . aspirin EC 81 MG tablet Take  1 tablet (81 mg total) by mouth daily. 90 tablet 3  . atorvastatin (LIPITOR) 40 MG tablet Take 1 tablet (40 mg total) by mouth daily. 30 tablet 0  . carvedilol (COREG) 12.5 MG tablet Take 1 tablet (12.5 mg total) by mouth 2 (two) times daily. 60 tablet 3  . furosemide (LASIX) 20 MG tablet Take 1 tablet (20 mg total) by mouth as needed for fluid (Weight gain of more than 5 pounds.). 30 tablet 0  . isosorbide-hydrALAZINE (BIDIL) 20-37.5 MG per tablet Take 2 tablets by mouth 3 (three) times daily. 180 tablet 3  . omeprazole (PRILOSEC) 40 MG capsule Take 40 mg by mouth daily.  0  . spironolactone (ALDACTONE) 25 MG tablet Take 1 tablet (25 mg total) by mouth daily. 30 tablet 6  . lisinopril (PRINIVIL,ZESTRIL) 5 MG tablet Take 1 tablet (5 mg total) by mouth daily. 30 tablet 3   No current facility-administered medications for this encounter.   BP 126/80 mmHg  Pulse 68  Wt 201 lb 12 oz (91.513 kg)  SpO2 97% General: NAD Neck: No JVD, no thyromegaly or thyroid nodule.  Lungs: Clear to auscultation bilaterally with normal respiratory effort. CV: Nondisplaced PMI.  Heart regular S1/S2, +S4, no murmur.  No peripheral edema.  No carotid bruit.  Normal pedal pulses.  Abdomen: Soft, nontender, no hepatosplenomegaly, no distention.  Skin: Intact without lesions or rashes.  Neurologic: Alert and oriented  x 3.  Psych: Normal affect. Extremities: No clubbing or cyanosis.  HEENT: Normal.   Assessment/Plan: 1. HTN: Long-standing, severe, poorly controlled over time.  Has had history of CVA and CKD.  BP remains high at home.  Renal artery dopplers were normal.  - Continue Bidil 2 tabs tid, spironolactone 25 mg daily, and amlodipine 10 mg daily.  - Increase Coreg to 12.5 mg bid.  - Add lisinopril 5 mg daily now that creatinine is improved.  BMET in 10 days.  - Daytime sleepiness: Awaiting sleep study.   2. CAD: Moderate diffuse CAD, no severe obstruction.  This did not cause her cardiomyopathy.  No chest  pain.  - Continue ASA 81 - Continue atorvastatin, will need lipids at next appointment.   3. CKD: Creatinine last 1.56, will repeat BMET 10 days after lisinopril begun.  4. 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  She does not look volume overloaded on exam, weight stable. Echo with EF 35-40% today, looks above ICD range.   - Take Lasix 20 mg daily if weight 201 or above.  - Continue Bidil 2 tabs tid and spironolactone.  - Increase Coreg to 12.5 mg bid as above.   - Add lisinopril 5 mg daily as above.  - Cardiac MRI in 3 months for better quantification of EF.   Loralie Champagne 07/13/2014

## 2014-07-19 ENCOUNTER — Ambulatory Visit (HOSPITAL_COMMUNITY)
Admission: RE | Admit: 2014-07-19 | Discharge: 2014-07-19 | Disposition: A | Discharge status: Home / Self Care | Payer: Commercial Managed Care - HMO | Source: Ambulatory Visit | Attending: Internal Medicine | Admitting: Internal Medicine

## 2014-07-19 DIAGNOSIS — I5022 Chronic systolic (congestive) heart failure: Secondary | ICD-10-CM | POA: Insufficient documentation

## 2014-07-19 LAB — BASIC METABOLIC PANEL
Anion gap: 10 (ref 5–15)
BUN: 38 mg/dL — AB (ref 6–20)
CALCIUM: 10.4 mg/dL — AB (ref 8.9–10.3)
CO2: 21 mmol/L — ABNORMAL LOW (ref 22–32)
CREATININE: 1.67 mg/dL — AB (ref 0.44–1.00)
Chloride: 104 mmol/L (ref 101–111)
GFR calc Af Amer: 34 mL/min — ABNORMAL LOW (ref >60)
GFR, EST NON AFRICAN AMERICAN: 29 mL/min — AB (ref >60)
GLUCOSE: 105 mg/dL — AB (ref 65–99)
Potassium: 4.6 mmol/L (ref 3.5–5.1)
Sodium: 135 mmol/L (ref 135–145)

## 2014-08-02 ENCOUNTER — Ambulatory Visit (HOSPITAL_BASED_OUTPATIENT_CLINIC_OR_DEPARTMENT_OTHER): Payer: Commercial Managed Care - HMO | Attending: Cardiology

## 2014-08-02 VITALS — Ht 64.0 in | Wt 201.0 lb

## 2014-08-02 DIAGNOSIS — I493 Ventricular premature depolarization: Secondary | ICD-10-CM | POA: Insufficient documentation

## 2014-08-02 DIAGNOSIS — R0683 Snoring: Secondary | ICD-10-CM | POA: Diagnosis not present

## 2014-08-02 DIAGNOSIS — G4719 Other hypersomnia: Secondary | ICD-10-CM | POA: Diagnosis present

## 2014-08-02 DIAGNOSIS — G4733 Obstructive sleep apnea (adult) (pediatric): Secondary | ICD-10-CM

## 2014-08-02 DIAGNOSIS — I491 Atrial premature depolarization: Secondary | ICD-10-CM | POA: Diagnosis not present

## 2014-08-02 DIAGNOSIS — I519 Heart disease, unspecified: Secondary | ICD-10-CM

## 2014-08-04 ENCOUNTER — Telehealth: Payer: Self-pay | Admitting: Cardiology

## 2014-08-04 DIAGNOSIS — G4733 Obstructive sleep apnea (adult) (pediatric): Secondary | ICD-10-CM | POA: Insufficient documentation

## 2014-08-04 DIAGNOSIS — G4719 Other hypersomnia: Secondary | ICD-10-CM | POA: Insufficient documentation

## 2014-08-04 NOTE — Telephone Encounter (Signed)
Please let patient know that they have sleep apnea and recommend CPAP titration. Please set up titration in the sleep lab. 

## 2014-08-04 NOTE — Sleep Study (Addendum)
   NAME: Tara Dunn DATE OF BIRTH:  [birth year] MEDICAL RECORD NUMBER [id number]  LOCATION: [facility]  PHYSICIAN: TURNER,TRACI R  DATE OF STUDY: 08/02/2014  SLEEP STUDY TYPE: Nocturnal Polysomnogram               REFERRING PHYSICIAN: Larey Dresser, MD  INDICATION FOR STUDY: snoring, nonrestorative sleep, excessive daytime sleepiness  EPWORTH SLEEPINESS SCORE: 3 HEIGHT: 5\' 4"  (162.6 cm)  WEIGHT: 201 lb (91.173 kg)    Body mass index is 34.48 kg/(m^2).  NECK SIZE: 14 in.  MEDICATIONS: Reviewed in the chart  SLEEP ARCHITECTURE: The patient slept for a total of 270 minutes out of a total sleep period time of 372 minutes.  There was no slow wave sleep and 10 minutes of REM sleep.  The onset to sleep latency was prolonged at 46 minutes.  The onset to REM sleep latency was prolonged at 230 minutes.  The sleep efficiency was reduced at 64%.    RESPIRATORY DATA: There were 14 apneas, of which, 8 were obstructive, 3 were central and 3 were mixed.  There were 42 hypopneas.  The AHI was 12.4 events per hour and the AHI during REM sleep was elevated at 34 events per hour.  Most events occurred during NREM sleep in the supine position.  There was moderate snoring.    OXYGEN DATA: The average oxygen saturation was 92% and the lowest oxygen saturation was 87%.  The time spent with oxygen saturations < 88% was 0.1 minute.    CARDIAC DATA: The patient maintained NSR with PAC's and PVC's during the study.  The average heart rate was 74 bpm and the lowest heart rate was 39 bpm.    MOVEMENT/PARASOMNIA: There were no periodic limb movements or REM sleep behavior disorders noted.    IMPRESSION/ RECOMMENDATION:   1.  Mild obstructive sleep apnea/hypopnea syndrome with an AHI of 12.4 events per hour.  The AHI during REM sleep was 34 events per hour. Most events occurred during NREM sleep in the supine position. 2.  Moderate snoring was noted. 3.  Abnormal sleep architecture  with reduced REM sleep, prolonged onset to REM sleep latency and no slow wave sleep. 4.  Reduced sleep efficiency with increased frequency of arousals. 5.  Oxygen desaturations with respiratory events as low as 87%.   6.  There were PAC's and PVC's noted during the study. 7.  CPAP titration would be appropriate given the degree of sleep disordered breathing with moderate snoring and oxygen desaturations.   8.  Treatment would also include careful attention to proper sleep hygiene, weight reduction for elevated BMI, avoidance of sleeping in the supine position and avoidance of alcohol within four hours of bedtime.  Specific treatment decisions should be tailored to each patient based upon the clinical situation and all treatment options should be considered.  The patient should be instructed to avoid driving if sleepy and careful clinical follow up is needed to ensure that the patient's symptoms are improving with therapy and the PAP adherence is supported and measured if prescribed.     Signed: Sueanne Margarita Diplomate, American Board of Sleep Medicine  ELECTRONICALLY SIGNED ON:  08/04/2014, 6:44 PM Page PH: [phone]   FX: [phone] [place]

## 2014-08-05 ENCOUNTER — Ambulatory Visit: Payer: Commercial Managed Care - HMO | Admitting: *Deleted

## 2014-08-05 ENCOUNTER — Other Ambulatory Visit: Payer: Self-pay | Admitting: *Deleted

## 2014-08-05 NOTE — Patient Outreach (Signed)
Arrived at pt's apartment for scheduled home visit, knocked several times on her door  with no response.   Called pt on her phone, received voice message to which RN CM left voice message that included contact number. Plan to call pt again tomorrow if no response.    Tara Dunn.   [place] Care Management  [phone]

## 2014-08-05 NOTE — Telephone Encounter (Signed)
Left message to call back  

## 2014-08-06 ENCOUNTER — Other Ambulatory Visit: Payer: Self-pay | Admitting: *Deleted

## 2014-08-06 VITALS — BP 143/94 | HR 67 | Resp 20 | Ht 64.0 in | Wt 201.2 lb

## 2014-08-06 DIAGNOSIS — I10 Essential (primary) hypertension: Secondary | ICD-10-CM

## 2014-08-06 NOTE — Patient Outreach (Signed)
[facility]) Care Management  [place]  08/06/2014   Tara Dunn 10-05-1940 [id number]  Subjective: Pt reports side/top of left foot hurts today, hard to walk on (+9 when walking, +5 when sitting).  Pt states saw Eye MD yesterday, scheduled for cataract surgery to left eye 7/7.  Pt states had cataract in right eye done before moving to [facility], surgery did not work.  Pt states saw Heart MD, did get a new BP machine prior to seeing MD.  Pt states she has not taken her BP since 6/26, not been checking daily.  Pt states had sleep study done 6/24, no results given yet.  Pt states to f/u with Heart MD again 7/14.  Pt states she has not weighed in a week.  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   Objective:  BP 150/100 right arm with nurse's cuff, 143/94 with pt's BP machine.  Pulse 67, respirations- 20, Oxygen saturation- 98 %.  Lungs clear.  Trace edema noted bilateral ankles/top of feet.  Had pt weigh today- result 201.2 lbs (ate 4 hours earlier).     Current Medications:  Current Outpatient Prescriptions  Medication Sig Dispense Refill  . allopurinol (ZYLOPRIM) 100 MG tablet Take 100 mg by mouth daily.  0  . amLODipine (NORVASC) 10 MG tablet Take 1 tablet (10 mg total) by mouth daily. 30 tablet 3  . aspirin EC 81 MG tablet Take 1 tablet (81 mg total) by mouth daily. 90 tablet 3  . atorvastatin (LIPITOR) 40 MG tablet Take 1 tablet (40 mg total) by mouth daily. 30 tablet 0  . carvedilol (COREG) 12.5 MG tablet Take 1 tablet (12.5 mg total) by mouth 2 (two) times daily. 60 tablet 3  . furosemide (LASIX) 20 MG tablet Take 1 tablet (20 mg total) by mouth as needed for fluid (Weight gain of more than 5 pounds.). 30 tablet 0  . isosorbide-hydrALAZINE (BIDIL) 20-37.5 MG per tablet Take 2 tablets by mouth 3 (three) times daily. 180 tablet 3  . lisinopril (PRINIVIL,ZESTRIL) 5 MG tablet Take 1 tablet (5 mg total) by  mouth daily. 30 tablet 3  . omeprazole (PRILOSEC) 40 MG capsule Take 40 mg by mouth daily.  0  . spironolactone (ALDACTONE) 25 MG tablet Take 1 tablet (25 mg total) by mouth daily. 30 tablet 6   No current facility-administered medications for this visit.    Assessment:  HTN- BP today elevated 150/100 with nurse's cuff, 143/94 with pt's BP machine.  Pt needs to check BP more often.  HF- pt not weighed in a week, needs to start back checking daily/record/take 54m of Lasix once a day as ordered when weight 201 lbs. Or higher.  Gout- medication taken daily not helping, need to inform MD.   Plan:  Pt to check BP 3-4 times a week (2 hours after getting up)/record/bring to next office visit with Dr.               MAundra Dubin              Pt to weigh daily/record/take Lasix 20 mg daily when weight 201 lbs or higher.            Pt to call Dr. DErnie Hew inform MD gout medication not helping            RN CM to continue to provide community nurse case management services, next visit 7/28.  [facility] CM Care Plan Problem One        Patient Outreach from 07/05/2014 in [place] for Problem One  Active    [facility] CM Care Plan Problem Two        Patient Outreach from 08/06/2014 in [place] CM Short Term Goal #1 (0-30 days)  Medication issue- Pt would take Lasix as ordered (weight gain of 5 lbs) in the next 30 days    [facility] CM Short Term Goal #1 Start Date  07/05/14   [facility] CM Short Term Goal #1 Met Date   08/06/14    [facility] CM Care Plan Problem Three        Patient Outreach from 08/06/2014 in [place] CM Short Term Goal #1 Met Date  07/09/14 [6/28- pt states purchased new BP machine before seeing Heart MD ]   [facility] CM Short Term Goal #2 (0-30 days)  Pt to check BP daily/record within the next 30 days    [facility] CM Short Term Goal #2 Start Date  07/05/14   [facility] CM Short Term Goal #2 Met Date  -- [pt states not been checking it daily, does twice a week. ]    Interventions for Short Term Goal #2  5/27- Discussed with pt importance of checking BP with hx of HTN,stroke.     [facility] CM Short Term Goal #4 (0-30 days)  pt would check her BP 3-4 times a week/record in the next 30 days    [facility] CM Short Term Goal #4 Start Date  08/06/14   Interventions for Short Term Goal #4  Discussed with pt with Heart MD increasing her BP medication/adding a new one- need to check BP to see if medication is working    [facility] CM Short Term Goal #5 (0-30 days)  Pt would weigh daily for the next 30 days, when 201 lbs or higher take Lasix 25m daily    [facility] CM Short Term Goal #5 Start Date  08/06/14   Interventions for Short Term Goal #5  Discussed with pt importance of weighing daily, record, if weight 201 lbs or higher take Lasix 220mdaily      Rose M.   [facility]  [phone]

## 2014-08-06 NOTE — Patient Outreach (Signed)
Called pt today, discussed arrived at her home yesterday for scheduled appointment (home visit) to which pt states had an eye appointment yesterday.  Discussed voice message left to which pt states did not check yet.   Home visit scheduled for today 6/28.      Zara Chess.   [place] Care Management  [phone]

## 2014-08-06 NOTE — Telephone Encounter (Signed)
Left message to call back  

## 2014-08-08 ENCOUNTER — Telehealth: Payer: Self-pay | Admitting: Cardiology

## 2014-08-08 ENCOUNTER — Encounter: Payer: Self-pay | Admitting: *Deleted

## 2014-08-08 NOTE — Telephone Encounter (Signed)
After multiple failed attempts, I contacted the daughter listed in the emergency contacts. She is going to reach out to her mom and have her call me back

## 2014-08-08 NOTE — Telephone Encounter (Signed)
Follow up:   Pt returned this office call please give her a call back about her sleep study.

## 2014-08-08 NOTE — Telephone Encounter (Signed)
See note below

## 2014-08-08 NOTE — Telephone Encounter (Signed)
Patient is aware of results. She asked that I also call and give her daughter the results. CPAP titration is scheduled and a letter sent to the patient per her request reminding her of the date.

## 2014-08-08 NOTE — Addendum Note (Signed)
Addended by: Andres Ege on: 08/08/2014 04:02 PM   Modules accepted: Orders

## 2014-08-14 ENCOUNTER — Telehealth: Payer: Self-pay | Admitting: *Deleted

## 2014-08-14 NOTE — Telephone Encounter (Signed)
NEwMessage  RN from Dr. Ival Bible office called to make sure that the titration results would be sent to their office.

## 2014-08-14 NOTE — Telephone Encounter (Signed)
Spoke with RN, she just wanted to verify that the patient was scheduled for Titration.  I let her know that she was, and that they should see the results as soon as they are in.

## 2014-08-22 ENCOUNTER — Encounter (HOSPITAL_COMMUNITY): Payer: Self-pay

## 2014-08-22 ENCOUNTER — Ambulatory Visit (HOSPITAL_COMMUNITY)
Admission: RE | Admit: 2014-08-22 | Discharge: 2014-08-22 | Disposition: A | Discharge status: Home / Self Care | Payer: Commercial Managed Care - HMO | Source: Ambulatory Visit | Attending: Cardiology | Admitting: Cardiology

## 2014-08-22 VITALS — BP 136/74 | HR 71 | Wt 200.8 lb

## 2014-08-22 DIAGNOSIS — G4733 Obstructive sleep apnea (adult) (pediatric): Secondary | ICD-10-CM | POA: Insufficient documentation

## 2014-08-22 DIAGNOSIS — Z87891 Personal history of nicotine dependence: Secondary | ICD-10-CM | POA: Insufficient documentation

## 2014-08-22 DIAGNOSIS — I2510 Atherosclerotic heart disease of native coronary artery without angina pectoris: Secondary | ICD-10-CM | POA: Diagnosis not present

## 2014-08-22 DIAGNOSIS — N183 Chronic kidney disease, stage 3 unspecified: Secondary | ICD-10-CM

## 2014-08-22 DIAGNOSIS — N189 Chronic kidney disease, unspecified: Secondary | ICD-10-CM | POA: Diagnosis not present

## 2014-08-22 DIAGNOSIS — Z9221 Personal history of antineoplastic chemotherapy: Secondary | ICD-10-CM | POA: Diagnosis not present

## 2014-08-22 DIAGNOSIS — Z8673 Personal history of transient ischemic attack (TIA), and cerebral infarction without residual deficits: Secondary | ICD-10-CM | POA: Diagnosis not present

## 2014-08-22 DIAGNOSIS — Z8249 Family history of ischemic heart disease and other diseases of the circulatory system: Secondary | ICD-10-CM | POA: Diagnosis not present

## 2014-08-22 DIAGNOSIS — Z79899 Other long term (current) drug therapy: Secondary | ICD-10-CM | POA: Diagnosis not present

## 2014-08-22 DIAGNOSIS — Z853 Personal history of malignant neoplasm of breast: Secondary | ICD-10-CM | POA: Diagnosis not present

## 2014-08-22 DIAGNOSIS — I5022 Chronic systolic (congestive) heart failure: Secondary | ICD-10-CM | POA: Diagnosis not present

## 2014-08-22 DIAGNOSIS — Z7982 Long term (current) use of aspirin: Secondary | ICD-10-CM | POA: Insufficient documentation

## 2014-08-22 DIAGNOSIS — I429 Cardiomyopathy, unspecified: Secondary | ICD-10-CM | POA: Diagnosis not present

## 2014-08-22 DIAGNOSIS — I129 Hypertensive chronic kidney disease with stage 1 through stage 4 chronic kidney disease, or unspecified chronic kidney disease: Secondary | ICD-10-CM | POA: Insufficient documentation

## 2014-08-22 LAB — BASIC METABOLIC PANEL
ANION GAP: 9 (ref 5–15)
BUN: 33 mg/dL — ABNORMAL HIGH (ref 6–20)
CALCIUM: 10.6 mg/dL — AB (ref 8.9–10.3)
CHLORIDE: 106 mmol/L (ref 101–111)
CO2: 21 mmol/L — AB (ref 22–32)
Creatinine, Ser: 1.68 mg/dL — ABNORMAL HIGH (ref 0.44–1.00)
GFR calc Af Amer: 33 mL/min — ABNORMAL LOW (ref >60)
GFR calc non Af Amer: 29 mL/min — ABNORMAL LOW (ref >60)
Glucose, Bld: 101 mg/dL — ABNORMAL HIGH (ref 65–99)
Potassium: 4.7 mmol/L (ref 3.5–5.1)
Sodium: 136 mmol/L (ref 135–145)

## 2014-08-22 LAB — LIPID PANEL
CHOL/HDL RATIO: 2.3 ratio
Cholesterol: 133 mg/dL (ref 0–200)
HDL: 58 mg/dL (ref >40)
LDL Cholesterol: 62 mg/dL (ref 0–99)
Triglycerides: 64 mg/dL (ref <150)
VLDL: 13 mg/dL (ref 0–40)

## 2014-08-22 LAB — BRAIN NATRIURETIC PEPTIDE: B Natriuretic Peptide: 14.8 pg/mL (ref 0.0–100.0)

## 2014-08-22 MED ORDER — CARVEDILOL 12.5 MG PO TABS: 18.7500 mg | ORAL_TABLET | Freq: Two times a day (BID) | ORAL | Status: DC

## 2014-08-22 NOTE — Patient Instructions (Signed)
Routine lab work today. Will notify you of abnormal results, otherwise no news is good news!  INCREASE Carvedilol to 18.75 mg (1.5 tabs) twice daily.  Follow up 6 weeks.  [facility] will contact you to set up a cardiac MRI.  Do the following things EVERYDAY: 1) Weigh yourself in the morning before breakfast. Write it down and keep it in a log. 2) Take your medicines as prescribed 3) Eat low salt foods-Limit salt (sodium) to 2000 mg per day.  4) Stay as active as you can everyday 5) Limit all fluids for the day to less than 2 liters

## 2014-08-22 NOTE — Progress Notes (Signed)
Patient ID: Tara Dunn, female   DOB: 12-Mar-1940, 74 y.o.   MRN: [id number] PCP: Dr. Ernie Hew  74 yo with long history of HTN, CVAs, CKD, and systolic CHF.  She moved to [place] from [place] in 2015.    In 3/16, she was admitted a hypertensive emergency and acute CHF.  CTA chest showed no PE.  Echo showed EF 25-30% with moderate LVH.  She was diuresed and had a cardiac catheterization given extensive coronary calcification seen on CTA chest.  Cath showed diffuse moderate CAD, worst was 50-70% mRCA stenosis.   Doing well overall.  No exertional dyspnea, no chest pain.  No orthopnea/PND. Weight stable.  SBP doing better, now < 140 when she checks at home.  Excellent BP today.  No lightheadedness.   Labs (3/16): K 4.2, creatinine 1.44, HCT 39.7 Labs (06/05/14): K 4.1, creatinine 2.01 Labs (5/16): K 4.7, creatinine 1.56 Labs (6/16): K 4.6, creatinine 1.67  PMH: 1. CVA: 3 prior CVAs, last in 1998.  Thought to be related to HTN. 2. HTN: Long-standing, says she has been hypertensive since age 35. Renal artery dopplers (4/16) were normal.  3. CKD: Suspect related to HTN. 4. Breast cancer: 2009, left mastectomy with chemotherapy.  5. Gout 6. Obese 7. TAH 8. CAD: LHC (3/16) with moderate diffuse disease, worst lesion being 50-70% mid RCA.  9. Cardiomyopathy: Echo (3/16) with EF 25-30%, moderate LVH.  Echo (6/16) with EF 35-40%, moderate LVH, diffuse hypokinesis with septal-lateral dyssynchrony.  10. OSA: Mild, followed by Dr Radford Pax.   SH: From [place], moved to [place] in 2015 to be near daughter.  Single.  Quit smoking 2009.    FH: HTN runs in family.  Father with MI.   ROS: All systems reviewed and negative except as per HPI.   Current Outpatient Prescriptions  Medication Sig Dispense Refill  . allopurinol (ZYLOPRIM) 100 MG tablet Take 100 mg by mouth daily.  0  . amLODipine (NORVASC) 10 MG tablet Take 1 tablet (10 mg total) by mouth daily. 30 tablet 3  . aspirin EC 81 MG tablet Take 1 tablet  (81 mg total) by mouth daily. 90 tablet 3  . atorvastatin (LIPITOR) 40 MG tablet Take 1 tablet (40 mg total) by mouth daily. 30 tablet 0  . carvedilol (COREG) 12.5 MG tablet Take 1.5 tablets (18.75 mg total) by mouth 2 (two) times daily. 90 tablet 3  . furosemide (LASIX) 20 MG tablet Take 1 tablet (20 mg total) by mouth as needed for fluid (Weight gain of more than 5 pounds.). 30 tablet 0  . isosorbide-hydrALAZINE (BIDIL) 20-37.5 MG per tablet Take 2 tablets by mouth 3 (three) times daily. 180 tablet 3  . lisinopril (PRINIVIL,ZESTRIL) 5 MG tablet Take 1 tablet (5 mg total) by mouth daily. 30 tablet 3  . omeprazole (PRILOSEC) 40 MG capsule Take 40 mg by mouth daily.  0  . spironolactone (ALDACTONE) 25 MG tablet Take 1 tablet (25 mg total) by mouth daily. 30 tablet 6   No current facility-administered medications for this encounter.   BP 136/74 mmHg  Pulse 71  Wt 200 lb 12 oz (91.06 kg)  SpO2 98% General: NAD Neck: No JVD, no thyromegaly or thyroid nodule.  Lungs: Clear to auscultation bilaterally with normal respiratory effort. CV: Nondisplaced PMI.  Heart regular S1/S2, +S4, no murmur.  1+ ankle edema.  No carotid bruit.  Normal pedal pulses.  Abdomen: Soft, nontender, no hepatosplenomegaly, no distention.  Skin: Intact without lesions or rashes.  Neurologic:  Alert and oriented x 3.  Psych: Normal affect. Extremities: No clubbing or cyanosis.  HEENT: Normal.   Assessment/Plan: 1. HTN: Long-standing, severe, poorly controlled over time.  Has had history of CVA and CKD.  BP now under better control.  Renal artery dopplers were normal.  - Continue Bidil 2 tabs tid, spironolactone 25 mg daily, lisinopril 5 mg daily, and amlodipine 10 mg daily.  - Increase Coreg to 18.75 mg bid.  - Mild OSA on sleep study, to followup with Dr Radford Pax regarding treatment.  2. CAD: Moderate diffuse CAD, no severe obstruction.  This did not cause her cardiomyopathy.  No chest pain.  - Continue ASA 81 -  Continue atorvastatin, check lipids today.  3. CKD: BMET today.  4. 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  She does not look volume overloaded on exam, weight stable. Echo with EF 35-40% on last echo, looked above ICD range.   - Take Lasix 20 mg daily if weight 201 or above.  - Continue Bidil 2 tabs tid and spironolactone.  - Increase Coreg to 18.75 mg bid as above.   - Continue lisinopril.  - Cardiac MRI in 9/16 for better quantification of EF.   Loralie Champagne 08/22/2014

## 2014-09-05 ENCOUNTER — Other Ambulatory Visit: Payer: Self-pay | Admitting: *Deleted

## 2014-09-05 VITALS — BP 136/84 | HR 73 | Resp 16 | Ht 64.0 in | Wt 197.0 lb

## 2014-09-05 DIAGNOSIS — I10 Essential (primary) hypertension: Secondary | ICD-10-CM

## 2014-09-05 NOTE — Patient Outreach (Signed)
[facility]) Care Management   09/05/2014  Tara Dunn 1940/05/12 [id number]  Tara Dunn is an 74 y.o. female  Subjective:  Pt states had eye surgery (left eye) 3 weeks ago, f/u with Eye MD today, received a good report, to f/u in a year.   Pt states was told eyesight now is 20/20.   Pt states saw Heart MD this month, said everything good, increased dosage on one of her BP medication.  Pt states checks BP three times a week.  Pt states she dropped iron on her right foot yesterday, +4 walking, 0 at rest.   Pt states she is going to soak her foot in Epsom salt.   Objective:  BP taken with pt's BP machine today- 123/64 (prior to am meds).   Filed Vitals:   09/05/14 1025  BP: 136/84  Pulse: 73  Resp: 16    ROS  Physical Exam  Lungs clear. No edema present.  Right foot assessed, no bruising, tender to touch.   Current Medications:  Reviewed medications with pt.  Current Outpatient Prescriptions  Medication Sig Dispense Refill  . allopurinol (ZYLOPRIM) 100 MG tablet Take 100 mg by mouth daily.  0  . amLODipine (NORVASC) 10 MG tablet Take 1 tablet (10 mg total) by mouth daily. 30 tablet 3  . aspirin EC 81 MG tablet Take 1 tablet (81 mg total) by mouth daily. 90 tablet 3  . atorvastatin (LIPITOR) 40 MG tablet Take 1 tablet (40 mg total) by mouth daily. 30 tablet 0  . carvedilol (COREG) 12.5 MG tablet Take 1.5 tablets (18.75 mg total) by mouth 2 (two) times daily. 90 tablet 3  . furosemide (LASIX) 20 MG tablet Take 1 tablet (20 mg total) by mouth as needed for fluid (Weight gain of more than 5 pounds.). 30 tablet 0  . isosorbide-hydrALAZINE (BIDIL) 20-37.5 MG per tablet Take 2 tablets by mouth 3 (three) times daily. 180 tablet 3  . lisinopril (PRINIVIL,ZESTRIL) 5 MG tablet Take 1 tablet (5 mg total) by mouth daily. 30 tablet 3  . omeprazole (PRILOSEC) 40 MG capsule Take 40 mg by mouth daily.  0  . spironolactone (ALDACTONE) 25 MG tablet Take 1 tablet (25 mg total)  by mouth daily. 30 tablet 6   No current facility-administered medications for this visit.    Functional Status:   In your present state of health, do you have any difficulty performing the following activities: 06/05/2014 05/02/2014  Hearing? N N  Vision? Y Y  Difficulty concentrating or making decisions? N N  Walking or climbing stairs? N Y  Dressing or bathing? N N  Doing errands, shopping? Y N  Preparing Food and eating ? Y -  Using the Toilet? Y -  In the past six months, have you accidently leaked urine? Y -  Do you have problems with loss of bowel control? N -  Managing your Medications? N -  Managing your Finances? N -  Housekeeping or managing your Housekeeping? Y -    Fall/Depression Screening:   No recent falls.   PHQ 2/9 Scores 06/05/2014  PHQ - 2 Score 0    Assessment:   HTN-  Reviewed pt's BP readings from her BP machine- 143/94, 148/88, 133/87,118/82, 127/78,                                       127/80, 145/93- pt reports some  taken before medications/some after.                            HF-  Pt reports weights range 194.8 lbs to 197 lbs (yesterday).     Plan:  Pt to continue to monitor BP 3 times a week, record,bring to MD appointment.             Pt to go back to weighing daily, record in [facility] calendar provided by RN CM.            As discussed with pt, plan to discharge from RN CM services, goals met.            RN CM to inform Dr. Ernie Hew of discharge, sending letter via fax in [place].            RN CM to send pt letter of discharge.            RN CM to inform [name] CMA of discharge, request close case.               [facility] CM Care Plan Problem One        Patient Outreach from 07/05/2014 in [place] for Problem One  Active    [facility] CM Care Plan Problem Two        Patient Outreach from 08/06/2014 in [place] CM Short Term Goal #1 (0-30 days)  Medication issue- Pt would take Lasix as ordered (weight gain of 5 lbs) in  the next 30 days    [facility] CM Short Term Goal #1 Start Date  07/05/14   [facility] CM Short Term Goal #1 Met Date   08/06/14    [facility] CM Care Plan Problem Three        Patient Outreach from 09/05/2014 in [place] Problem Three  Elevated BP    Care Plan for Problem Three  Active   [facility] CM Short Term Goal #4 (0-30 days)  pt would check her BP 3-4 times a week/record in the next 30 days    [facility] CM Short Term Goal #4 Start Date  08/06/14   [facility] CM Short Term Goal #4 Met Date  09/05/14   Interventions for Short Term Goal #4  Discussed with pt with Heart MD increasing her BP medication/adding a new one- need to check BP to see if medication is working    [facility] CM Short Term Goal #5 (0-30 days)  Pt would weigh daily for the next 30 days, when 201 lbs or higher take Lasix 51m daily    [facility] CM Short Term Goal #5 Start Date  08/06/14   [facility]  -- [not met, not weighing daily, pt states to start back ]   Interventions for Short Term Goal #5  Discussed with pt importance of weighing daily, record, if weight 201 lbs or higher take Lasix 240mdaily        Rose M.   [facility]  [phone]

## 2014-09-06 ENCOUNTER — Encounter: Payer: Self-pay | Admitting: *Deleted

## 2014-09-07 ENCOUNTER — Other Ambulatory Visit (HOSPITAL_COMMUNITY): Payer: Self-pay | Admitting: Internal Medicine

## 2014-09-09 ENCOUNTER — Other Ambulatory Visit (HOSPITAL_COMMUNITY): Payer: Self-pay | Admitting: Internal Medicine

## 2014-09-09 ENCOUNTER — Other Ambulatory Visit (HOSPITAL_COMMUNITY): Payer: Self-pay | Admitting: *Deleted

## 2014-09-09 MED ORDER — ATORVASTATIN CALCIUM 40 MG PO TABS: 40.0000 mg | ORAL_TABLET | Freq: Every day | ORAL | Status: DC

## 2014-09-10 ENCOUNTER — Other Ambulatory Visit: Payer: Self-pay

## 2014-09-10 MED ORDER — CARVEDILOL 12.5 MG PO TABS: 18.7500 mg | ORAL_TABLET | Freq: Two times a day (BID) | ORAL | Status: DC

## 2014-09-10 MED ORDER — SPIRONOLACTONE 25 MG PO TABS: 25.0000 mg | ORAL_TABLET | Freq: Every day | ORAL | Status: DC

## 2014-09-10 MED ORDER — LISINOPRIL 5 MG PO TABS: 5.0000 mg | ORAL_TABLET | Freq: Every day | ORAL | Status: DC

## 2014-09-10 MED ORDER — ATORVASTATIN CALCIUM 40 MG PO TABS: 40.0000 mg | ORAL_TABLET | Freq: Every day | ORAL | Status: DC

## 2014-09-26 ENCOUNTER — Other Ambulatory Visit (HOSPITAL_COMMUNITY): Payer: Self-pay | Admitting: *Deleted

## 2014-10-03 ENCOUNTER — Ambulatory Visit (HOSPITAL_COMMUNITY)
Admission: RE | Admit: 2014-10-03 | Discharge: 2014-10-03 | Disposition: A | Discharge status: Home / Self Care | Payer: Commercial Managed Care - HMO | Source: Ambulatory Visit | Attending: Cardiology | Admitting: Cardiology

## 2014-10-03 VITALS — BP 92/50 | HR 71 | Wt 205.0 lb

## 2014-10-03 DIAGNOSIS — N183 Chronic kidney disease, stage 3 unspecified: Secondary | ICD-10-CM

## 2014-10-03 DIAGNOSIS — I10 Essential (primary) hypertension: Secondary | ICD-10-CM

## 2014-10-03 DIAGNOSIS — Z853 Personal history of malignant neoplasm of breast: Secondary | ICD-10-CM | POA: Diagnosis not present

## 2014-10-03 DIAGNOSIS — I5022 Chronic systolic (congestive) heart failure: Secondary | ICD-10-CM

## 2014-10-03 DIAGNOSIS — I129 Hypertensive chronic kidney disease with stage 1 through stage 4 chronic kidney disease, or unspecified chronic kidney disease: Secondary | ICD-10-CM | POA: Insufficient documentation

## 2014-10-03 DIAGNOSIS — I2510 Atherosclerotic heart disease of native coronary artery without angina pectoris: Secondary | ICD-10-CM | POA: Diagnosis not present

## 2014-10-03 DIAGNOSIS — Z7982 Long term (current) use of aspirin: Secondary | ICD-10-CM | POA: Insufficient documentation

## 2014-10-03 DIAGNOSIS — Z8249 Family history of ischemic heart disease and other diseases of the circulatory system: Secondary | ICD-10-CM | POA: Insufficient documentation

## 2014-10-03 DIAGNOSIS — Z8673 Personal history of transient ischemic attack (TIA), and cerebral infarction without residual deficits: Secondary | ICD-10-CM | POA: Insufficient documentation

## 2014-10-03 DIAGNOSIS — I428 Other cardiomyopathies: Secondary | ICD-10-CM | POA: Insufficient documentation

## 2014-10-03 DIAGNOSIS — N189 Chronic kidney disease, unspecified: Secondary | ICD-10-CM | POA: Diagnosis not present

## 2014-10-03 DIAGNOSIS — M109 Gout, unspecified: Secondary | ICD-10-CM | POA: Insufficient documentation

## 2014-10-03 DIAGNOSIS — Z79899 Other long term (current) drug therapy: Secondary | ICD-10-CM | POA: Diagnosis not present

## 2014-10-03 MED ORDER — HYDRALAZINE HCL 50 MG PO TABS: 75.0000 mg | ORAL_TABLET | Freq: Three times a day (TID) | ORAL | Status: DC

## 2014-10-03 MED ORDER — ISOSORBIDE MONONITRATE ER 60 MG PO TB24: 90.0000 mg | ORAL_TABLET | Freq: Every day | ORAL | Status: DC

## 2014-10-03 MED ORDER — AMLODIPINE BESYLATE 10 MG PO TABS: 5.0000 mg | ORAL_TABLET | Freq: Every day | ORAL | Status: DC

## 2014-10-03 NOTE — Patient Instructions (Signed)
Decrease Amlodipine 5 mg (1/2 tab)   Stop Bidil when you run out and START:  Imdur 90 mg (60 mg tablets take 1 and 1/2 tabs) daily  Hydralazine 75 mg (50 mg tablets, take 1 and 1/2 tabs) Three times a day   Labs today  Your physician has requested that you have a cardiac MRI. Cardiac MRI uses a computer to create images of your heart as its beating, producing both still and moving pictures of your heart and major blood vessels. For further information please visit [email or web address] Please follow the instruction sheet given to you today for more information.  Your physician recommends that you schedule a follow-up appointment in: 2 months

## 2014-10-03 NOTE — Progress Notes (Signed)
Patient ID: Tara Dunn, female   DOB: 24-Aug-1940, 74 y.o.   MRN: [id number] PCP: Dr. Ernie Hew  74 yo with long history of HTN, CVAs, CKD, and systolic CHF.  She moved to [place] from [place] in 2015.    In 3/16, she was admitted a hypertensive emergency and acute CHF.  CTA chest showed no PE.  Echo showed EF 25-30% with moderate LVH.  She was diuresed and had a cardiac catheterization given extensive coronary calcification seen on CTA chest.  Cath showed diffuse moderate CAD, worst was 50-70% mRCA stenosis.   No exertional dyspnea, no chest pain.  No orthopnea/PND. Weight stable.  SBP low in 90s today but she denies lightheadedness.  She is not going to be able to refill Bidil due to expense.  She has been having gout pain in her left 1st MTP.  This has limited her ambulation. She has not been using Lasix.    Labs (3/16): K 4.2, creatinine 1.44, HCT 39.7 Labs (06/05/14): K 4.1, creatinine 2.01 Labs (5/16): K 4.7, creatinine 1.56 Labs (7/16): K 4.7, creatinine 1.68, BNP 14, LDL 62  PMH: 1. CVA: 3 prior CVAs, last in 1998.  Thought to be related to HTN. 2. HTN: Long-standing, says she has been hypertensive since age 87. Renal artery dopplers (4/16) were normal.  3. CKD: Suspect related to HTN. 4. Breast cancer: 2009, left mastectomy with chemotherapy.  5. Gout 6. Obese 7. TAH 8. CAD: LHC (3/16) with moderate diffuse disease, worst lesion being 50-70% mid RCA.  9. Cardiomyopathy: Echo (3/16) with EF 25-30%, moderate LVH.  Echo (6/16) with EF 35-40%, moderate LVH, diffuse hypokinesis with septal-lateral dyssynchrony.   SH: From [place], moved to [place] in 2015 to be near daughter.  Single.  Quit smoking 2009.    FH: HTN runs in family.  Father with MI.   ROS: All systems reviewed and negative except as per HPI.   Current Outpatient Prescriptions  Medication Sig Dispense Refill  . allopurinol (ZYLOPRIM) 100 MG tablet Take 100 mg by mouth daily.  0  . amLODipine (NORVASC) 10 MG tablet Take  0.5 tablets (5 mg total) by mouth daily. 30 tablet 3  . atorvastatin (LIPITOR) 40 MG tablet Take 1 tablet (40 mg total) by mouth daily. 90 tablet 3  . carvedilol (COREG) 12.5 MG tablet Take 1.5 tablets (18.75 mg total) by mouth 2 (two) times daily. 270 tablet 3  . colchicine-probenecid 0.5-500 MG per tablet Take 1 tablet by mouth 2 (two) times daily.    . Difluprednate (DUREZOL OP) Apply to eye.    . furosemide (LASIX) 20 MG tablet Take 1 tablet (20 mg total) by mouth as needed for fluid (Weight gain of more than 5 pounds.). 30 tablet 0  . lisinopril (PRINIVIL,ZESTRIL) 5 MG tablet Take 1 tablet (5 mg total) by mouth daily. 90 tablet 3  . ofloxacin (OCUFLOX) 0.3 % ophthalmic solution 1 drop 4 (four) times daily.    [place] Kitchen omeprazole (PRILOSEC) 40 MG capsule Take 40 mg by mouth daily.  0  . spironolactone (ALDACTONE) 25 MG tablet Take 1 tablet (25 mg total) by mouth daily. 90 tablet 3  . aspirin EC 81 MG tablet Take 1 tablet (81 mg total) by mouth daily. 90 tablet 3  . hydrALAZINE (APRESOLINE) 50 MG tablet Take 1.5 tablets (75 mg total) by mouth 3 (three) times daily. 270 tablet 3  . isosorbide mononitrate (IMDUR) 60 MG 24 hr tablet Take 1.5 tablets (90 mg total) by mouth daily.  270 tablet 3   No current facility-administered medications for this encounter.   BP 92/50 mmHg  Pulse 71  Wt 205 lb (92.987 kg)  SpO2 92% General: NAD Neck: No JVD, no thyromegaly or thyroid nodule.  Lungs: Clear to auscultation bilaterally with normal respiratory effort. CV: Nondisplaced PMI.  Heart regular S1/S2, +S4, no murmur.  No peripheral edema.  No carotid bruit.  Normal pedal pulses.  Abdomen: Soft, nontender, no hepatosplenomegaly, no distention.  Skin: Intact without lesions or rashes.  Neurologic: Alert and oriented x 3.  Psych: Normal affect. Extremities: No clubbing or cyanosis.  HEENT: Normal.   Assessment/Plan: 1. HTN: Long-standing, severe, poorly controlled over time.  Has had history of CVA and  CKD.  Renal artery dopplers were normal.  We have aggressively titrated her meds, and BP is actually running low at this time.  - Decrease amlodipine to 5 mg daily.  2. CAD: Moderate diffuse CAD, no severe obstruction.  This did not cause her cardiomyopathy.  No chest pain.  - Continue ASA 81 - Continue atorvastatin, good lipids 7/16. 3. CKD: Check BMET today.  4. 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  She does not look volume overloaded on exam, weight stable. Echo with EF 35-40% in 6/16, looks above ICD range.   - She has had Lasix for prn use but has not been using it.  - She cannot afford Bidil.  Will replace it with hydralazine 75 mg tid and Imdur 90 mg daily.  - Continue current Coreg, lisinopril, and spironolactone.   - Cardiac MRI in 9/16 to better quantify EF for ?ICD.    Followup in 2 months.    Loralie Champagne 10/03/2014

## 2014-10-31 ENCOUNTER — Ambulatory Visit (HOSPITAL_BASED_OUTPATIENT_CLINIC_OR_DEPARTMENT_OTHER): Payer: Commercial Managed Care - HMO | Attending: Cardiology

## 2014-10-31 DIAGNOSIS — G4733 Obstructive sleep apnea (adult) (pediatric): Secondary | ICD-10-CM | POA: Insufficient documentation

## 2014-10-31 DIAGNOSIS — I493 Ventricular premature depolarization: Secondary | ICD-10-CM | POA: Diagnosis not present

## 2014-10-31 DIAGNOSIS — G4730 Sleep apnea, unspecified: Secondary | ICD-10-CM | POA: Diagnosis present

## 2014-11-05 ENCOUNTER — Telehealth: Payer: Self-pay | Admitting: Cardiology

## 2014-11-05 NOTE — Telephone Encounter (Signed)
Pt had successful PAP titration. Please setup appointment in 10 weeks. Please let [facility] know that order for PAP is in EPIC.   

## 2014-11-05 NOTE — Sleep Study (Signed)
   Patient Name: Tara Dunn, Tara Dunn MRN: [id number] Study Date: 10/31/2014 Gender: Female D.O.B: [birth year] Age (years): 42 Referring Provider: Fransico Him MD, ABSM Interpreting Physician: Fransico Him MD, ABSM  RPSGT: Baxter Flattery Weight (lbs): 201 Height (inches): 64 BMI: 34 Neck Size: 15.00  CLINICAL INFORMATION The patient is referred for a CPAP titration to treat sleep apnea.  Date of NPSG, Split Night or HST:08/04/2014  SLEEP STUDY TECHNIQUE As per the AASM Manual for the Scoring of Sleep and Associated Events v2.3 (April 2016) with a hypopnea requiring 4% desaturations.  The channels recorded and monitored were frontal, central and occipital EEG, electrooculogram (EOG), submentalis EMG (chin), nasal and oral airflow, thoracic and abdominal wall motion, anterior tibialis EMG, snore microphone, electrocardiogram, and pulse oximetry. Continuous positive airway pressure (CPAP) was initiated at the beginning of the study and titrated to treat sleep-disordered breathing.  MEDICATIONS Medications taken by the patient : Allopurinol, colchicine-probenecid, ASA, Lipitor, Coreg, Lasix, Hydralazine, Imdur, Lisinorpil, Prilosec, Aldactone Medications administered by patient during sleep study : No sleep medicine administered.  TECHNICIAN COMMENTS Comments added by technician: Patient talked in his/her sleep.  Comments added by scorer: N/A  RESPIRATORY PARAMETERS Optimal PAP Pressure (cm): 9  AHI at Optimal Pressure (/hr):0.0 Overall Minimal O2 (%):91.00   Supine % at Optimal Pressure (%):100 Minimal O2 at Optimal Pressure (%):95.0    SLEEP ARCHITECTURE The study was initiated at 10:57:51 PM and ended at 4:55:52 AM.  Sleep onset time was 55.3 minutes and the sleep efficiency was reduced at 67.0%. The total sleep time was 239.7 minutes.  The patient spent 3.96% of the night in stage N1 sleep, 90.20% in stage N2 sleep, 0.00% in stage N3 and 5.84% in REM.Stage REM latency was  182.5 minutes  Wake after sleep onset was 63.0. Alpha intrusion was absent. Supine sleep was 31.27%.  CARDIAC DATA The 2 lead EKG demonstrated sinus rhythm. The mean heart rate was 53.77 beats per minute. Other EKG findings include: PACs and PVCs.  LEG MOVEMENT DATA The total Periodic Limb Movements of Sleep (PLMS) were 0. The PLMS index was 0.00. A PLMS index of <15 is considered normal in adults.  IMPRESSIONS The optimal PAP pressure was 9 cm of water. Central sleep apnea was not noted during this titration (CAI = 0.5/h). Moderate oxygen desaturations were observed during this titration (min O2 = 91.00%). No snoring was audible during this study. 2-lead EKG demonstrated: PACs and PVCs Clinically significant periodic limb movements were not noted during this study. Arousals associated with PLMs were rare.  DIAGNOSIS Obstructive Sleep Apnea (327.23 [G47.33 ICD-10])  RECOMMENDATIONS Trial of CPAP therapy on 9 cm H2O with a Medium size Fisher&Paykel Full Face Mask Simplus mask and heated humidification. Avoid alcohol, sedatives and other CNS depressants that may worsen sleep apnea and disrupt normal sleep architecture. Sleep hygiene should be reviewed to assess factors that may improve sleep quality. Weight management and regular exercise should be initiated or continued. Return to [place] for re-evaluation after 10 weeks of therapy and download in 4 weeks to assess compliance.   Sueanne Margarita Diplomate, American Board of Sleep Medicine  ELECTRONICALLY SIGNED ON:  11/05/2014, 7:30 PM [place] PH: [phone]   FX: [phone] [place]

## 2014-11-05 NOTE — Addendum Note (Signed)
Addended by: Sueanne Margarita on: 11/05/2014 07:59 PM   Modules accepted: Orders

## 2014-11-06 NOTE — Telephone Encounter (Signed)
Patient is aware. [place] Notified. Once she is set up with her machine, I will schedule the 10 week follow-up

## 2014-11-14 ENCOUNTER — Other Ambulatory Visit: Payer: Self-pay | Admitting: Family Medicine

## 2014-11-14 DIAGNOSIS — Z1231 Encounter for screening mammogram for malignant neoplasm of breast: Secondary | ICD-10-CM

## 2014-11-16 ENCOUNTER — Other Ambulatory Visit (HOSPITAL_COMMUNITY): Payer: Self-pay | Admitting: Internal Medicine

## 2014-11-28 ENCOUNTER — Ambulatory Visit
Admission: RE | Admit: 2014-11-28 | Discharge: 2014-11-28 | Disposition: A | Discharge status: Home / Self Care | Payer: Commercial Managed Care - HMO | Source: Ambulatory Visit | Attending: Family Medicine | Admitting: Family Medicine

## 2014-11-28 DIAGNOSIS — Z1231 Encounter for screening mammogram for malignant neoplasm of breast: Secondary | ICD-10-CM

## 2014-12-04 ENCOUNTER — Telehealth: Payer: Self-pay | Admitting: *Deleted

## 2014-12-04 NOTE — Telephone Encounter (Signed)
Left message for patient to call to reschedule 10/13 appointment time.  Keeping the same day, but need to change her appointment to 9:00am.

## 2014-12-09 ENCOUNTER — Other Ambulatory Visit: Payer: Self-pay | Admitting: Family Medicine

## 2014-12-09 DIAGNOSIS — Z853 Personal history of malignant neoplasm of breast: Secondary | ICD-10-CM

## 2014-12-11 ENCOUNTER — Encounter: Payer: Self-pay | Admitting: Cardiology

## 2014-12-18 ENCOUNTER — Ambulatory Visit
Admission: RE | Admit: 2014-12-18 | Discharge: 2014-12-18 | Disposition: A | Discharge status: Home / Self Care | Payer: Commercial Managed Care - HMO | Source: Ambulatory Visit | Attending: Family Medicine | Admitting: Family Medicine

## 2014-12-18 ENCOUNTER — Encounter: Payer: Self-pay | Admitting: Cardiology

## 2014-12-18 ENCOUNTER — Ambulatory Visit: Payer: Commercial Managed Care - HMO

## 2014-12-18 ENCOUNTER — Other Ambulatory Visit: Payer: Self-pay | Admitting: Family Medicine

## 2014-12-18 DIAGNOSIS — M109 Gout, unspecified: Secondary | ICD-10-CM

## 2014-12-25 ENCOUNTER — Ambulatory Visit (HOSPITAL_COMMUNITY)
Admission: RE | Admit: 2014-12-25 | Discharge: 2014-12-25 | Disposition: A | Discharge status: Home / Self Care | Payer: Commercial Managed Care - HMO | Source: Ambulatory Visit | Attending: Cardiology | Admitting: Cardiology

## 2014-12-25 ENCOUNTER — Encounter (HOSPITAL_COMMUNITY): Payer: Self-pay

## 2014-12-25 VITALS — BP 102/60 | HR 62 | Wt 204.5 lb

## 2014-12-25 DIAGNOSIS — I429 Cardiomyopathy, unspecified: Secondary | ICD-10-CM | POA: Insufficient documentation

## 2014-12-25 DIAGNOSIS — Z853 Personal history of malignant neoplasm of breast: Secondary | ICD-10-CM | POA: Diagnosis not present

## 2014-12-25 DIAGNOSIS — N183 Chronic kidney disease, stage 3 unspecified: Secondary | ICD-10-CM

## 2014-12-25 DIAGNOSIS — Z7982 Long term (current) use of aspirin: Secondary | ICD-10-CM | POA: Insufficient documentation

## 2014-12-25 DIAGNOSIS — I2510 Atherosclerotic heart disease of native coronary artery without angina pectoris: Secondary | ICD-10-CM | POA: Insufficient documentation

## 2014-12-25 DIAGNOSIS — N189 Chronic kidney disease, unspecified: Secondary | ICD-10-CM | POA: Insufficient documentation

## 2014-12-25 DIAGNOSIS — Z79899 Other long term (current) drug therapy: Secondary | ICD-10-CM | POA: Insufficient documentation

## 2014-12-25 DIAGNOSIS — Z8249 Family history of ischemic heart disease and other diseases of the circulatory system: Secondary | ICD-10-CM | POA: Insufficient documentation

## 2014-12-25 DIAGNOSIS — I130 Hypertensive heart and chronic kidney disease with heart failure and stage 1 through stage 4 chronic kidney disease, or unspecified chronic kidney disease: Secondary | ICD-10-CM | POA: Diagnosis not present

## 2014-12-25 DIAGNOSIS — Z8673 Personal history of transient ischemic attack (TIA), and cerebral infarction without residual deficits: Secondary | ICD-10-CM | POA: Diagnosis not present

## 2014-12-25 DIAGNOSIS — I5022 Chronic systolic (congestive) heart failure: Secondary | ICD-10-CM | POA: Diagnosis not present

## 2014-12-25 DIAGNOSIS — M109 Gout, unspecified: Secondary | ICD-10-CM | POA: Diagnosis not present

## 2014-12-25 DIAGNOSIS — I10 Essential (primary) hypertension: Secondary | ICD-10-CM

## 2014-12-25 DIAGNOSIS — E669 Obesity, unspecified: Secondary | ICD-10-CM | POA: Diagnosis not present

## 2014-12-25 DIAGNOSIS — G4733 Obstructive sleep apnea (adult) (pediatric): Secondary | ICD-10-CM | POA: Diagnosis not present

## 2014-12-25 LAB — BASIC METABOLIC PANEL
Anion gap: 10 (ref 5–15)
BUN: 30 mg/dL — AB (ref 6–20)
CO2: 21 mmol/L — AB (ref 22–32)
Calcium: 10 mg/dL (ref 8.9–10.3)
Chloride: 106 mmol/L (ref 101–111)
Creatinine, Ser: 1.93 mg/dL — ABNORMAL HIGH (ref 0.44–1.00)
GFR calc Af Amer: 28 mL/min — ABNORMAL LOW (ref >60)
GFR, EST NON AFRICAN AMERICAN: 24 mL/min — AB (ref >60)
GLUCOSE: 115 mg/dL — AB (ref 65–99)
POTASSIUM: 4.2 mmol/L (ref 3.5–5.1)
Sodium: 137 mmol/L (ref 135–145)

## 2014-12-25 MED ORDER — COLCHICINE 0.6 MG PO TABS: 0.6000 mg | ORAL_TABLET | Freq: Every day | ORAL | Status: DC | PRN

## 2014-12-25 NOTE — Patient Instructions (Signed)
Start Colchicine 0.6 mg daily AS NEEDED for gout pain  Lab today  Your physician has requested that you have a cardiac MRI. Cardiac MRI uses a computer to create images of your heart as its beating, producing both still and moving pictures of your heart and major blood vessels. For further information please visit [email or web address] Please follow the instruction sheet given to you today for more information.  We will contact you in 3 months to schedule your next appointment.

## 2014-12-25 NOTE — Progress Notes (Signed)
Patient ID: Tara Dunn, female   DOB: October 12, 1940, 74 y.o.   MRN: [id number] PCP: Dr. Ernie Hew  74 yo with long history of HTN, CVAs, CKD, and systolic CHF.  She moved to [place] from [place] in 2015.    In 3/16, she was admitted a hypertensive emergency and acute CHF.  CTA chest showed no PE.  Echo showed EF 25-30% with moderate LVH.  She was diuresed and had a cardiac catheterization given extensive coronary calcification seen on CTA chest.  Cath showed diffuse moderate CAD, worst was 50-70% mRCA stenosis.   No exertional dyspnea, no chest pain.  No orthopnea/PND. Weight stable.  Good BP but says that it still runs high occasionally.  Gout pain in right great toe and knee for a week.    Labs (3/16): K 4.2, creatinine 1.44, HCT 39.7 Labs (06/05/14): K 4.1, creatinine 2.01 Labs (5/16): K 4.7, creatinine 1.56 Labs (7/16): K 4.7, creatinine 1.68, BNP 14, LDL 62  PMH: 1. CVA: 3 prior CVAs, last in 1998.  Thought to be related to HTN. 2. HTN: Long-standing, says she has been hypertensive since age 73. Renal artery dopplers (4/16) were normal.  3. CKD: Suspect related to HTN. 4. Breast cancer: 2009, left mastectomy with chemotherapy.  5. Gout 6. Obese 7. TAH 8. CAD: LHC (3/16) with moderate diffuse disease, worst lesion being 50-70% mid RCA.  9. Cardiomyopathy: Echo (3/16) with EF 25-30%, moderate LVH.  Echo (6/16) with EF 35-40%, moderate LVH, diffuse hypokinesis with septal-lateral dyssynchrony.  10. OSA: Using CPAP.   SH: From [place], moved to [place] in 2015 to be near daughter.  Single.  Quit smoking 2009.    FH: HTN runs in family.  Father with MI.   ROS: All systems reviewed and negative except as per HPI.   Current Outpatient Prescriptions  Medication Sig Dispense Refill  . allopurinol (ZYLOPRIM) 100 MG tablet Take 100 mg by mouth daily.  0  . amLODipine (NORVASC) 10 MG tablet Take 0.5 tablets (5 mg total) by mouth daily. 30 tablet 3  . aspirin EC 81 MG tablet Take 1 tablet (81 mg  total) by mouth daily. 90 tablet 3  . atorvastatin (LIPITOR) 40 MG tablet Take 1 tablet (40 mg total) by mouth daily. 90 tablet 3  . carvedilol (COREG) 12.5 MG tablet Take 1.5 tablets (18.75 mg total) by mouth 2 (two) times daily. 270 tablet 3  . colchicine-probenecid 0.5-500 MG per tablet Take 1 tablet by mouth 2 (two) times daily.    . Difluprednate (DUREZOL OP) Apply to eye.    . furosemide (LASIX) 20 MG tablet Take 1 tablet (20 mg total) by mouth as needed for fluid (Weight gain of more than 5 pounds.). 30 tablet 0  . hydrALAZINE (APRESOLINE) 50 MG tablet Take 1.5 tablets (75 mg total) by mouth 3 (three) times daily. 270 tablet 3  . isosorbide mononitrate (IMDUR) 60 MG 24 hr tablet Take 1.5 tablets (90 mg total) by mouth daily. 270 tablet 3  . lisinopril (PRINIVIL,ZESTRIL) 5 MG tablet Take 1 tablet (5 mg total) by mouth daily. 90 tablet 3  . ofloxacin (OCUFLOX) 0.3 % ophthalmic solution 1 drop 4 (four) times daily.    Marland Kitchen omeprazole (PRILOSEC) 40 MG capsule Take 40 mg by mouth daily.  0  . spironolactone (ALDACTONE) 25 MG tablet Take 1 tablet (25 mg total) by mouth daily. 90 tablet 3  . colchicine 0.6 MG tablet Take 1 tablet (0.6 mg total) by mouth daily as needed.  30 tablet 3   No current facility-administered medications for this encounter.   BP 102/60 mmHg  Pulse 62  Wt 204 lb 8 oz (92.761 kg)  SpO2 98% General: NAD Neck: No JVD, no thyromegaly or thyroid nodule.  Lungs: Clear to auscultation bilaterally with normal respiratory effort. CV: Nondisplaced PMI.  Heart regular S1/S2, +S4, no murmur.  No peripheral edema.  No carotid bruit.  Normal pedal pulses.  Abdomen: Soft, nontender, no hepatosplenomegaly, no distention.  Skin: Intact without lesions or rashes.  Neurologic: Alert and oriented x 3.  Psych: Normal affect. Extremities: No clubbing or cyanosis.  HEENT: Normal.   Assessment/Plan: 1. HTN: Long-standing, severe, poorly controlled over time.  Has had history of CVA and  CKD.  Renal artery dopplers were normal. BP seems to be doing much better, but she still says that it runs high on occasion at home.  She will check BP daily x 2 weeks we will call her for the numbers.  2. CAD: Moderate diffuse CAD, no severe obstruction.  This did not cause her cardiomyopathy.  No chest pain.  - Continue ASA 81 - Continue atorvastatin, good lipids 7/16. 3. CKD: Check BMET today.  4. 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  She does not look volume overloaded on exam, weight stable. Echo with EF 35-40% in 6/16.   - She has had Lasix for prn use but has not been using it.  - Continue current hydralazine/Imdur, Coreg, lisinopril, and spironolactone.   - I will arrange for Cardiac MRI to better quantify EF for ?ICD.    Followup in 3 months.    Loralie Champagne 12/25/2014

## 2014-12-31 ENCOUNTER — Encounter: Payer: Self-pay | Admitting: Cardiology

## 2015-01-08 ENCOUNTER — Other Ambulatory Visit: Payer: Self-pay | Admitting: Family Medicine

## 2015-01-08 ENCOUNTER — Ambulatory Visit
Admission: RE | Admit: 2015-01-08 | Discharge: 2015-01-08 | Disposition: A | Discharge status: Home / Self Care | Payer: Commercial Managed Care - HMO | Source: Ambulatory Visit | Attending: Family Medicine | Admitting: Family Medicine

## 2015-01-08 DIAGNOSIS — M109 Gout, unspecified: Secondary | ICD-10-CM

## 2015-01-08 DIAGNOSIS — M170 Bilateral primary osteoarthritis of knee: Secondary | ICD-10-CM

## 2015-01-13 ENCOUNTER — Ambulatory Visit (HOSPITAL_COMMUNITY)
Admission: RE | Admit: 2015-01-13 | Discharge: 2015-01-13 | Disposition: A | Discharge status: Home / Self Care | Payer: Commercial Managed Care - HMO | Source: Ambulatory Visit | Attending: Cardiology | Admitting: Cardiology

## 2015-01-13 ENCOUNTER — Ambulatory Visit (HOSPITAL_COMMUNITY): Admission: RE | Admit: 2015-01-13 | Payer: Commercial Managed Care - HMO | Source: Ambulatory Visit

## 2015-01-13 DIAGNOSIS — N189 Chronic kidney disease, unspecified: Secondary | ICD-10-CM | POA: Insufficient documentation

## 2015-01-13 DIAGNOSIS — I429 Cardiomyopathy, unspecified: Secondary | ICD-10-CM | POA: Diagnosis not present

## 2015-01-13 DIAGNOSIS — I5022 Chronic systolic (congestive) heart failure: Secondary | ICD-10-CM | POA: Diagnosis not present

## 2015-01-16 ENCOUNTER — Telehealth (HOSPITAL_COMMUNITY): Payer: Self-pay

## 2015-01-16 NOTE — Telephone Encounter (Signed)
Patient called CHF triage line returning missed call from our office.  No phone notes or recent lab results on file, will f/u with clinical staff and call her back if needed.  Aware and agreeable.  Renee Pain

## 2015-01-21 ENCOUNTER — Ambulatory Visit: Payer: Commercial Managed Care - HMO | Admitting: Cardiology

## 2015-01-21 ENCOUNTER — Encounter: Payer: Self-pay | Admitting: Cardiology

## 2015-01-21 ENCOUNTER — Ambulatory Visit (INDEPENDENT_AMBULATORY_CARE_PROVIDER_SITE_OTHER): Payer: Commercial Managed Care - HMO | Admitting: Cardiology

## 2015-01-21 VITALS — BP 138/90 | HR 65 | Ht 64.0 in | Wt 203.6 lb

## 2015-01-21 DIAGNOSIS — I10 Essential (primary) hypertension: Secondary | ICD-10-CM

## 2015-01-21 DIAGNOSIS — G4733 Obstructive sleep apnea (adult) (pediatric): Secondary | ICD-10-CM

## 2015-01-21 DIAGNOSIS — E669 Obesity, unspecified: Secondary | ICD-10-CM

## 2015-01-21 HISTORY — DX: Obesity, unspecified: E66.9

## 2015-01-21 NOTE — Progress Notes (Signed)
Cardiology Office Note   Date:  01/21/2015   ID:  [name], DOB [birth year], MRN [id number]  PCP:  Rachell Cipro, MD    Chief Complaint  Patient presents with  . Follow-up    osa      History of Present Illness: Tara Dunn is a 74 y.o. female who presents for evaluation of OSA.  She was referred by Dr. Aundra Dubin for snoring, nonrestorative sleep and excessive daytime sleepiness. She underwent PSG showing mild OSA with an AHI of 12.4/hr but during REM sleep the OSA was severe with an AHI of 34/hr.  There was moderate snoring noted and oxygen saturations dropped to 87%.  She underwent CPAP titration and is now on CPAP at 9cm H2O.  She is doing well with her device.  She tolerates the nasal pillow mask and feels the pressure is adequate.  Since going on the CPAP she feels more rested in the am but still has some mild daytime sleepiness.      Past Medical History  Diagnosis Date  . Hypertension     a. Dx @ age 67;  b. 04/2014 admission for HTN emergency.  . Cardiomyopathy ([place])     a. 04/2014 Echo: EF 25-30%, mod conc LVH, possibl antsept HK, Gr1 DD, mod-sev dil LA.  Marland Kitchen History of stroke     a. 3 strokes - last in 1998.  . Aortic dilatation ([place])     a. 04/2014 CTA chest w/ incidental finding of distal thoracic Ao enlargement of 3.39 mm - f/u needed 04/2015.  . Breast cancer ([place])     a. 2009 s/p L mastectomy and chemo.  . CKD (chronic kidney disease), stage III   . Gout   . Obesity   . CHF (congestive heart failure) ([place])   . Stroke [facility])     pt states had 3 strokes     Past Surgical History  Procedure Laterality Date  . Mastectomy Left   . Abdominal hysterectomy    . Left heart catheterization with coronary angiogram N/A 05/07/2014    Procedure: LEFT HEART CATHETERIZATION WITH CORONARY ANGIOGRAM;  Surgeon: Belva Crome, MD;  Location[facility] LAB;  Service: Cardiovascular;  Laterality: N/A;  . Breast surgery      left breast removed       Current Outpatient Prescriptions  Medication Sig Dispense Refill  . allopurinol (ZYLOPRIM) 100 MG tablet Take 100 mg by mouth daily.  0  . amLODipine (NORVASC) 10 MG tablet Take 0.5 tablets (5 mg total) by mouth daily. 30 tablet 3  . aspirin EC 81 MG tablet Take 1 tablet (81 mg total) by mouth daily. 90 tablet 3  . atorvastatin (LIPITOR) 40 MG tablet Take 1 tablet (40 mg total) by mouth daily. 90 tablet 3  . carvedilol (COREG) 12.5 MG tablet Take 1.5 tablets (18.75 mg total) by mouth 2 (two) times daily. 270 tablet 3  . colchicine 0.6 MG tablet Take 1 tablet (0.6 mg total) by mouth daily as needed. 30 tablet 3  . colchicine-probenecid 0.5-500 MG per tablet Take 1 tablet by mouth 2 (two) times daily.    . Difluprednate (DUREZOL OP) Apply to eye.    . furosemide (LASIX) 20 MG tablet Take 1 tablet (20 mg total) by mouth as needed for fluid (Weight gain of more than 5 pounds.). 30 tablet 0  . hydrALAZINE (APRESOLINE) 50 MG tablet Take 1.5 tablets (  75 mg total) by mouth 3 (three) times daily. 270 tablet 3  . isosorbide mononitrate (IMDUR) 60 MG 24 hr tablet Take 1.5 tablets (90 mg total) by mouth daily. 270 tablet 3  . lisinopril (PRINIVIL,ZESTRIL) 5 MG tablet Take 1 tablet (5 mg total) by mouth daily. 90 tablet 3  . ofloxacin (OCUFLOX) 0.3 % ophthalmic solution 1 drop 4 (four) times daily.    Marland Kitchen omeprazole (PRILOSEC) 40 MG capsule Take 40 mg by mouth daily.  0  . spironolactone (ALDACTONE) 25 MG tablet Take 1 tablet (25 mg total) by mouth daily. 90 tablet 3   No current facility-administered medications for this visit.    Allergies:   Shellfish allergy    Social History:  The patient  reports that she has quit smoking. Her smoking use included Cigarettes. She has a 20 pack-year smoking history. She does not have any smokeless tobacco history on file. She reports that she does not drink alcohol or use illicit drugs.   Family History:  The patient's family history includes Heart attack in  her father; High blood pressure in her father, mother, and sister.    ROS:  Please see the history of present illness.   Otherwise, review of systems are positive for none.   All other systems are reviewed and negative.    PHYSICAL EXAM: VS:  BP 138/90 mmHg  Pulse 65  Ht 5\' 4"  (1.626 m)  Wt 203 lb 9.6 oz (92.352 kg)  BMI 34.93 kg/m2  SpO2 97% , BMI Body mass index is 34.93 kg/(m^2). GEN: Well nourished, well developed, in no acute distress HEENT: normal Neck: no JVD, carotid bruits, or masses Cardiac: RRR; no murmurs, rubs, or gallops,no edema  Respiratory:  clear to auscultation bilaterally, normal work of breathing GI: soft, nontender, nondistended, + BS MS: no deformity or atrophy Skin: warm and dry, no rash Neuro:  Strength and sensation are intact Psych: euthymic mood, full affect   EKG:  EKG is not ordered today.    Recent Labs: 05/02/2014: Magnesium 1.6 05/04/2014: ALT 17 05/08/2014: Hemoglobin 12.7; Platelets 231 08/22/2014: B Natriuretic Peptide 14.8 12/25/2014: BUN 30*; Creatinine, Ser 1.93*; Potassium 4.2; Sodium 137    Lipid Panel    Component Value Date/Time   CHOL 133 08/22/2014 1100   TRIG 64 08/22/2014 1100   HDL 58 08/22/2014 1100   CHOLHDL 2.3 08/22/2014 1100   VLDL 13 08/22/2014 1100   LDLCALC 62 08/22/2014 1100      Wt Readings from Last 3 Encounters:  01/21/15 203 lb 9.6 oz (92.352 kg)  12/25/14 204 lb 8 oz (92.761 kg)  10/31/14 201 lb (91.173 kg)       ASSESSMENT AND PLAN:  1.  Mild OSA overall and severe during REM sleep now on CPAP and tolerating well.  Her d/l today showed an AHI of 1/hr on 9cm H2O and 77% compliance in using more than 4 hours nightly.  She will continue on current CPAP settings.  Patient has been using and benefiting from CPAP use and will continue to benefit from therapy.  2.  HTN - controlled on current medical therapy 3.  Obesity - her exercise is limited by difficulty walking and uses a walker   Current  medicines are reviewed at length with the patient today.  The patient does not have concerns regarding medicines.  The following changes have been made:  no change  Labs/ tests ordered today: See above Assessment and Plan No orders of the defined types were  placed in this encounter.     Disposition:   FU with me in 1 year  Signed, Sueanne Margarita, MD  01/21/2015 9:20 AM    [facility] [place], [place], [place]  [address] Phone: [phone]; Fax: [phone]

## 2015-01-21 NOTE — Patient Instructions (Signed)

## 2015-01-23 ENCOUNTER — Encounter: Payer: Self-pay | Admitting: Cardiology

## 2015-02-07 ENCOUNTER — Ambulatory Visit
Admission: RE | Admit: 2015-02-07 | Discharge: 2015-02-07 | Disposition: A | Discharge status: Home / Self Care | Payer: Commercial Managed Care - HMO | Source: Ambulatory Visit | Attending: Family Medicine | Admitting: Family Medicine

## 2015-02-07 ENCOUNTER — Other Ambulatory Visit: Payer: Self-pay | Admitting: Family Medicine

## 2015-02-07 DIAGNOSIS — N289 Disorder of kidney and ureter, unspecified: Secondary | ICD-10-CM

## 2015-02-11 DIAGNOSIS — N181 Chronic kidney disease, stage 1: Secondary | ICD-10-CM | POA: Diagnosis not present

## 2015-02-11 DIAGNOSIS — N19 Unspecified kidney failure: Secondary | ICD-10-CM | POA: Diagnosis not present

## 2015-02-14 DIAGNOSIS — I639 Cerebral infarction, unspecified: Secondary | ICD-10-CM | POA: Diagnosis not present

## 2015-02-14 DIAGNOSIS — I10 Essential (primary) hypertension: Secondary | ICD-10-CM | POA: Diagnosis not present

## 2015-02-14 DIAGNOSIS — N181 Chronic kidney disease, stage 1: Secondary | ICD-10-CM | POA: Diagnosis not present

## 2015-02-14 DIAGNOSIS — I509 Heart failure, unspecified: Secondary | ICD-10-CM | POA: Diagnosis not present

## 2015-02-17 ENCOUNTER — Encounter (HOSPITAL_COMMUNITY): Payer: PPO

## 2015-02-18 DIAGNOSIS — G4733 Obstructive sleep apnea (adult) (pediatric): Secondary | ICD-10-CM | POA: Diagnosis not present

## 2015-03-06 ENCOUNTER — Encounter (HOSPITAL_COMMUNITY): Payer: Self-pay

## 2015-03-06 ENCOUNTER — Ambulatory Visit (HOSPITAL_COMMUNITY)
Admission: RE | Admit: 2015-03-06 | Discharge: 2015-03-06 | Disposition: A | Discharge status: Home / Self Care | Payer: PPO | Source: Ambulatory Visit | Attending: Cardiology | Admitting: Cardiology

## 2015-03-06 VITALS — BP 118/72 | HR 74 | Wt 208.5 lb

## 2015-03-06 DIAGNOSIS — Z8249 Family history of ischemic heart disease and other diseases of the circulatory system: Secondary | ICD-10-CM | POA: Insufficient documentation

## 2015-03-06 DIAGNOSIS — M109 Gout, unspecified: Secondary | ICD-10-CM | POA: Diagnosis not present

## 2015-03-06 DIAGNOSIS — Z7982 Long term (current) use of aspirin: Secondary | ICD-10-CM | POA: Insufficient documentation

## 2015-03-06 DIAGNOSIS — I428 Other cardiomyopathies: Secondary | ICD-10-CM | POA: Insufficient documentation

## 2015-03-06 DIAGNOSIS — N189 Chronic kidney disease, unspecified: Secondary | ICD-10-CM | POA: Insufficient documentation

## 2015-03-06 DIAGNOSIS — G4733 Obstructive sleep apnea (adult) (pediatric): Secondary | ICD-10-CM | POA: Diagnosis not present

## 2015-03-06 DIAGNOSIS — R55 Syncope and collapse: Secondary | ICD-10-CM | POA: Insufficient documentation

## 2015-03-06 DIAGNOSIS — N183 Chronic kidney disease, stage 3 unspecified: Secondary | ICD-10-CM

## 2015-03-06 DIAGNOSIS — I2510 Atherosclerotic heart disease of native coronary artery without angina pectoris: Secondary | ICD-10-CM | POA: Diagnosis not present

## 2015-03-06 DIAGNOSIS — E669 Obesity, unspecified: Secondary | ICD-10-CM | POA: Insufficient documentation

## 2015-03-06 DIAGNOSIS — Z8673 Personal history of transient ischemic attack (TIA), and cerebral infarction without residual deficits: Secondary | ICD-10-CM | POA: Insufficient documentation

## 2015-03-06 DIAGNOSIS — I5022 Chronic systolic (congestive) heart failure: Secondary | ICD-10-CM | POA: Insufficient documentation

## 2015-03-06 DIAGNOSIS — I10 Essential (primary) hypertension: Secondary | ICD-10-CM

## 2015-03-06 DIAGNOSIS — I130 Hypertensive heart and chronic kidney disease with heart failure and stage 1 through stage 4 chronic kidney disease, or unspecified chronic kidney disease: Secondary | ICD-10-CM | POA: Insufficient documentation

## 2015-03-06 DIAGNOSIS — Z853 Personal history of malignant neoplasm of breast: Secondary | ICD-10-CM | POA: Insufficient documentation

## 2015-03-06 DIAGNOSIS — Z79899 Other long term (current) drug therapy: Secondary | ICD-10-CM | POA: Diagnosis not present

## 2015-03-06 DIAGNOSIS — Z9221 Personal history of antineoplastic chemotherapy: Secondary | ICD-10-CM | POA: Diagnosis not present

## 2015-03-06 DIAGNOSIS — Z87891 Personal history of nicotine dependence: Secondary | ICD-10-CM | POA: Insufficient documentation

## 2015-03-06 LAB — BASIC METABOLIC PANEL
ANION GAP: 8 (ref 5–15)
BUN: 39 mg/dL — AB (ref 6–20)
CALCIUM: 10.2 mg/dL (ref 8.9–10.3)
CO2: 20 mmol/L — ABNORMAL LOW (ref 22–32)
Chloride: 107 mmol/L (ref 101–111)
Creatinine, Ser: 1.84 mg/dL — ABNORMAL HIGH (ref 0.44–1.00)
GFR calc Af Amer: 30 mL/min — ABNORMAL LOW (ref >60)
GFR, EST NON AFRICAN AMERICAN: 26 mL/min — AB (ref >60)
GLUCOSE: 111 mg/dL — AB (ref 65–99)
Potassium: 4.2 mmol/L (ref 3.5–5.1)
Sodium: 135 mmol/L (ref 135–145)

## 2015-03-06 LAB — BRAIN NATRIURETIC PEPTIDE: B NATRIURETIC PEPTIDE 5: 42 pg/mL (ref 0.0–100.0)

## 2015-03-06 MED ORDER — CARVEDILOL 25 MG PO TABS: 25.0000 mg | ORAL_TABLET | Freq: Two times a day (BID) | ORAL | Status: DC

## 2015-03-06 NOTE — Progress Notes (Signed)
Patient ID: Tara Dunn, female   DOB: Sep 16, 1940, 75 y.o.   MRN: [id number] PCP: Dr. Ernie Hew  75 yo with long history of HTN, CVAs, CKD, and systolic CHF.  She moved to [place] from [place] in 2015.   In 3/16, she was admitted a hypertensive emergency and acute CHF.  CTA chest showed no PE.  Echo showed EF 25-30% with moderate LVH.  She was diuresed and had a cardiac catheterization given extensive coronary calcification seen on CTA chest.  Cath showed diffuse moderate CAD, worst was 50-70% mRCA stenosis.   Cardiac MRI in 12/16 showed EF 32%.    No dyspnea walking on flat ground generally unless she goes a long distance.  Exercises on stationary bike or treadmill.  No chest pain.  No orthopnea/PND. Weight up about 4 lbs.  BP "up and down."  Looks ok today. She also reports occasional episodes where she will "black out" momentarily.  She has not fallen or hurt herself.  Spells only last a few seconds she thinks.  No palpitations.  These episodes have occurred several times recently.  Labs (3/16): K 4.2, creatinine 1.44, HCT 39.7 Labs (06/05/14): K 4.1, creatinine 2.01 Labs (5/16): K 4.7, creatinine 1.56 Labs (7/16): K 4.7, creatinine 1.68, BNP 14, LDL 62 Labs (11/16): K 4.2, creatinine 1.93  ECG; NSR, LVH, QRS 132 mec  PMH: 1. CVA: 3 prior CVAs, last in 1998.  Thought to be related to HTN. 2. HTN: Long-standing, says she has been hypertensive since age 75. Renal artery dopplers (4/16) were normal.  3. CKD: Suspect related to HTN. 4. Breast cancer: 2009, left mastectomy with chemotherapy.  5. Gout 6. Obese 7. TAH 8. CAD: LHC (3/16) with moderate diffuse disease, worst lesion being 50-70% mid RCA.  9. Cardiomyopathy: Echo (3/16) with EF 25-30%, moderate LVH.  Echo (6/16) with EF 35-40%, moderate LVH, diffuse hypokinesis with septal-lateral dyssynchrony.  Cardiac MRI (12/16) with EF 32%, diffuse hypokinesis, no contrast given due to CKD.  10. OSA: Using CPAP.   SH: From [place], moved to  [place] in 2015 to be near daughter.  Single.  Quit smoking 2009.    FH: HTN runs in family.  Father with MI.   ROS: All systems reviewed and negative except as per HPI.   Current Outpatient Prescriptions  Medication Sig Dispense Refill  . allopurinol (ZYLOPRIM) 100 MG tablet Take 100 mg by mouth daily.  0  . amLODipine (NORVASC) 10 MG tablet Take 0.5 tablets (5 mg total) by mouth daily. 30 tablet 3  . aspirin EC 81 MG tablet Take 1 tablet (81 mg total) by mouth daily. 90 tablet 3  . atorvastatin (LIPITOR) 40 MG tablet Take 1 tablet (40 mg total) by mouth daily. 90 tablet 3  . carvedilol (COREG) 25 MG tablet Take 1 tablet (25 mg total) by mouth 2 (two) times daily. 60 tablet 6  . colchicine 0.6 MG tablet Take 1 tablet (0.6 mg total) by mouth daily as needed. 30 tablet 3  . colchicine-probenecid 0.5-500 MG per tablet Take 1 tablet by mouth 2 (two) times daily.    . Difluprednate (DUREZOL OP) Apply to eye.    . furosemide (LASIX) 20 MG tablet Take 1 tablet (20 mg total) by mouth as needed for fluid (Weight gain of more than 5 pounds.). 30 tablet 0  . hydrALAZINE (APRESOLINE) 50 MG tablet Take 1.5 tablets (75 mg total) by mouth 3 (three) times daily. 270 tablet 3  . isosorbide mononitrate (IMDUR) 60 MG  24 hr tablet Take 1.5 tablets (90 mg total) by mouth daily. 270 tablet 3  . lisinopril (PRINIVIL,ZESTRIL) 5 MG tablet Take 1 tablet (5 mg total) by mouth daily. 90 tablet 3  . ofloxacin (OCUFLOX) 0.3 % ophthalmic solution 1 drop 4 (four) times daily.    Marland Kitchen omeprazole (PRILOSEC) 40 MG capsule Take 40 mg by mouth daily.  0  . spironolactone (ALDACTONE) 25 MG tablet Take 1 tablet (25 mg total) by mouth daily. 90 tablet 3   No current facility-administered medications for this encounter.   BP 118/72 mmHg  Pulse 74  Wt 208 lb 8 oz (94.575 kg)  SpO2 97% General: NAD Neck: No JVD, no thyromegaly or thyroid nodule.  Lungs: Clear to auscultation bilaterally with normal respiratory effort. CV:  Nondisplaced PMI.  Heart regular S1/S2, +S4, no murmur.  No peripheral edema.  No carotid bruit.  Normal pedal pulses.  Abdomen: Soft, nontender, no hepatosplenomegaly, no distention.  Skin: Intact without lesions or rashes.  Neurologic: Alert and oriented x 3.  Psych: Normal affect. Extremities: No clubbing or cyanosis.  HEENT: Normal.   Assessment/Plan: 1. HTN: Long-standing, 75 severe, poorly controlled over time but better now.  Has had history of CVA and CKD.  Renal artery dopplers were normal. BP seems to be doing much better, but she still says that it runs high on occasion at home.  I will increase Coreg to 25 mg bid.  2. CAD: Moderate diffuse CAD, no severe obstruction.  This did not cause her cardiomyopathy.  No chest pain.  - Continue ASA 81 - Continue atorvastatin, good lipids 7/16. 3. CKD: Check BMET today.  4. 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  She does not look volume overloaded on exam, weight stable. NYHA class II.  EF has been persistently low, 32% by cardiac MRI in 12/16.  Unable to give contrast to assess for scar/infiltrative disease due to CKD.     - She has had Lasix for prn use but has not been using it.  - Continue current hydralazine/Imdur, lisinopril, and spironolactone.   - Increase Coreg to 25 mg bid. - We discussed ICD today.  EF has stayed persistently in the 30-35% range.  She has a nonischemic cardiomyopathy where benefit is not as marked as with ischemic cardiomyopathy. Unable to given contrast with MRI to assess for scar.  She has had "black out" spells with some concern for arrhythmia.   She did have septal-lateral dyssynchrony by echo but QRS is only 132 with LVH pattern.  She is probably not an ideal CRT candidate.  I am going to have her see EP to discuss ICD and ?CRT.  5. "Black out" spells: Seem to be momentary.  ?arrhythmic versus neurological.   - Place 30 day event recorder.   Followup in 6 wks.    Loralie Champagne 03/06/2015

## 2015-03-06 NOTE — Patient Instructions (Signed)
Increase Carvedilol 25 mg Twice daily, we have sent you in a new prescription for 25 mg tablets  Labs today  You have been referred to EP Doctors to discuss defibrillator   Your physician has recommended that you wear an event monitor. Event monitors are medical devices that record the heart's electrical activity. Doctors most often [place] these monitors to diagnose arrhythmias. Arrhythmias are problems with the speed or rhythm of the heartbeat. The monitor is a small, portable device. You can wear one while you do your normal daily activities. This is usually used to diagnose what is causing palpitations/syncope (passing out).  Your physician recommends that you schedule a follow-up appointment in: 6 weeks

## 2015-03-10 ENCOUNTER — Other Ambulatory Visit (HOSPITAL_COMMUNITY): Payer: Self-pay | Admitting: Cardiology

## 2015-03-10 ENCOUNTER — Ambulatory Visit (INDEPENDENT_AMBULATORY_CARE_PROVIDER_SITE_OTHER): Payer: PPO

## 2015-03-10 DIAGNOSIS — R55 Syncope and collapse: Secondary | ICD-10-CM

## 2015-03-11 ENCOUNTER — Encounter: Payer: Self-pay | Admitting: Internal Medicine

## 2015-03-11 ENCOUNTER — Ambulatory Visit (INDEPENDENT_AMBULATORY_CARE_PROVIDER_SITE_OTHER): Payer: PPO | Admitting: Internal Medicine

## 2015-03-11 VITALS — BP 142/98 | HR 64 | Ht 64.0 in | Wt 212.8 lb

## 2015-03-11 DIAGNOSIS — R55 Syncope and collapse: Secondary | ICD-10-CM | POA: Diagnosis not present

## 2015-03-11 DIAGNOSIS — I5022 Chronic systolic (congestive) heart failure: Secondary | ICD-10-CM | POA: Diagnosis not present

## 2015-03-11 DIAGNOSIS — Z8673 Personal history of transient ischemic attack (TIA), and cerebral infarction without residual deficits: Secondary | ICD-10-CM

## 2015-03-11 LAB — CBC WITH DIFFERENTIAL/PLATELET
BASOS PCT: 1 % (ref 0–1)
Basophils Absolute: 0 10*3/uL (ref 0.0–0.1)
EOS ABS: 0.1 10*3/uL (ref 0.0–0.7)
Eosinophils Relative: 3 % (ref 0–5)
HCT: 39.2 % (ref 36.0–46.0)
HEMOGLOBIN: 12.9 g/dL (ref 12.0–15.0)
Lymphocytes Relative: 25 % (ref 12–46)
Lymphs Abs: 1.1 10*3/uL (ref 0.7–4.0)
MCH: 28.9 pg (ref 26.0–34.0)
MCHC: 32.9 g/dL (ref 30.0–36.0)
MCV: 87.7 fL (ref 78.0–100.0)
MONO ABS: 0.4 10*3/uL (ref 0.1–1.0)
MPV: 9.3 fL (ref 8.6–12.4)
Monocytes Relative: 8 % (ref 3–12)
NEUTROS ABS: 2.8 10*3/uL (ref 1.7–7.7)
NEUTROS PCT: 63 % (ref 43–77)
PLATELETS: 196 10*3/uL (ref 150–400)
RBC: 4.47 MIL/uL (ref 3.87–5.11)
RDW: 17.2 % — ABNORMAL HIGH (ref 11.5–15.5)
WBC: 4.4 10*3/uL (ref 4.0–10.5)

## 2015-03-11 LAB — BASIC METABOLIC PANEL
BUN: 27 mg/dL — ABNORMAL HIGH (ref 7–25)
CALCIUM: 10.1 mg/dL (ref 8.6–10.4)
CO2: 25 mmol/L (ref 20–31)
Chloride: 104 mmol/L (ref 98–110)
Creat: 1.65 mg/dL — ABNORMAL HIGH (ref 0.60–0.93)
Glucose, Bld: 86 mg/dL (ref 65–99)
Potassium: 4.6 mmol/L (ref 3.5–5.3)
SODIUM: 138 mmol/L (ref 135–146)

## 2015-03-11 NOTE — Assessment & Plan Note (Signed)
Her symptoms are class 2. She will continue her current meds. I have discussed the indications for ICD implant with the patient and her daughter and recommended insertion of a ICD for primary prevention. The risks/benefits/goals/expectations of the procedure have been discussed and she wishes to proceed.

## 2015-03-11 NOTE — Assessment & Plan Note (Signed)
The etiology is unclear. She will undergo device implant.

## 2015-03-11 NOTE — Assessment & Plan Note (Signed)
We will plan to use an ICD that will also monitor for atrial fib.

## 2015-03-11 NOTE — Patient Instructions (Addendum)
Medication Instructions:  Your physician recommends that you continue on your current medications as directed. Please refer to the Current Medication list given to you today.   Labwork: Your physician recommends that you return for lab work today: BMP/CBC   Testing/Procedures: Your physician has recommended that you have a defibrillator inserted. An implantable cardioverter defibrillator (ICD) is a small device that is placed in your chest or, in rare cases, your abdomen. This device uses electrical pulses or shocks to help control life-threatening, irregular heartbeats that could lead the heart to suddenly stop beating (sudden cardiac arrest). Leads are attached to the ICD that goes into your heart. This is done in the hospital and usually requires an overnight stay. Please see the instruction sheet given to you today for more information.  Please arrive at the [facility] hospital on 03/26/15 at 8:30 am.  Do not eat or drink after midnight the night before your procedure.  Do not take your medications the morning of procedure    Follow-Up: Your physician recommends that you schedule a follow-up appointment in: 10-14 days from 03/26/15 in the device clinic for a wound check and 3 months from 03/26/15 with Dr Lovena Le   Any Other Special Instructions Will Be Listed Below (If Applicable).     If you need a refill on your cardiac medications before your next appointment, please call your pharmacy.  Cardioverter Defibrillator Implantation An implantable cardioverter defibrillator (ICD) is a small, lightweight, battery-powered device that is placed (implanted) under the skin in the chest or abdomen. Your caregiver may prescribe an ICD if:  You have had an irregular heart rhythm (arrhythmia) that originated in the lower chambers of the heart (ventricles).  Your heart has been damaged by a disease (such as coronary artery disease) or heart condition (such as a heart  attack). An ICD consists of a battery that lasts several years, a small computer called a pulse generator, and wires called leads that go into the heart. It is used to detect and correct two dangerous arrhythmias: a rapid heart rhythm (tachycardia) and an arrhythmia in which the ventricles contract in an uncoordinated way (fibrillation). When an ICD detects tachycardia, it sends an electrical signal to the heart that restores the heartbeat to normal (cardioversion). This signal is usually painless. If cardioversion does not work or if the ICD detects fibrillation, it delivers a small electrical shock to the heart (defibrillation) to restart the heart. The shock may feel like a strong jolt in the chest.ICDs may be programmed to correct other problems. Sometimes, ICDs are programmed to act as another type of implantable device called a pacemaker. Pacemakers are used to treat a slow heartbeat (bradycardia). LET YOUR CAREGIVER KNOW ABOUT:  Any allergies you have.  All medicines you are taking, including vitamins, herbs, eyedrops, and over-the-counter medicines and creams.  Previous problems you or members of your family have had with the use of anesthetics.  Any blood disorders you have had.  Other health problems you have. RISKS AND COMPLICATIONS Generally, the procedure to implant an ICD is safe. However, as with any surgical procedure, complications can occur. Possible complications associated with implanting an ICD include:  Swelling, bleeding, or bruising at the site where the ICD was implanted.  Infection at the site where the ICD was implanted.  A reaction to medicine used during the procedure.  Nerve, heart, or blood vessel damage.  Blood clots. BEFORE THE PROCEDURE  You may need to have blood tests,  heart tests, or a chest X-ray done before the day of the procedure.  Ask your caregiver about changing or stopping your regular medicines.  Make plans to have someone drive you home.  You may need to stay in the hospital overnight after the procedure.  Stop smoking at least 24 hours before the procedure.  Take a bath or shower the night before the procedure. You may need to scrub your chest or abdomen with a special type of soap.  Do not eat or drink before your procedure for as long as directed by your caregiver. Ask if it is okay to take any needed medicine with a small sip of water. PROCEDURE  The procedure to implant an ICD in your chest or abdomen is usually done at a hospital in a room that has a large X-ray machine called a fluoroscope. The machine will be above you during the procedure. It will help your caregiver see your heart during the procedure. Implanting an ICD usually takes 1-3 hours. Before the procedure:   Small monitors will be put on your body. They will be used to check your heart, blood pressure, and oxygen level.  A needle will be put into a vein in your hand or arm. This is called an intravenous (IV) access tube. Fluids and medicine will flow directly into your body through the IV tube.  Your chest or abdomen will be cleaned with a germ-killing (antiseptic) solution. The area may be shaved.  You may be given medicine to help you relax (sedative).  You will be given a medicine called a local anesthetic. This medicine will make the surgical site numb while the ICD is implanted. You will be sleepy but awake during the procedure. After you are numb the procedure will begin. The caregiver will:  Make a small cut (incision). This will make a pocket deep under your skin that will hold the pulse generator.  Guide the leads through a large blood vessel into your heart and attach them to the heart muscles. Depending on the ICD, the leads may go into one ventricle or they may go to both ventricles and into an upper chamber of the heart (atrium).  Test the ICD.  Close the incision with stitches, glue, or staples. AFTER THE PROCEDURE  You may feel pain.  Some pain is normal. It may last a few days.  You may stay in a recovery area until the local anesthetic has worn off. Your blood pressure and pulse will be checked often. You will be taken to a room where your heart will be monitored.  A chest X-ray will be taken. This is done to check that the cardioverter defibrillator is in the right place.  You may stay in the hospital overnight.  A slight bump may be seen over the skin where the ICD was placed. Sometimes, it is possible to feel the ICD under the skin. This is normal.  In the months and years afterward, your caregiver will check the device, the leads, and the battery every few months. Eventually, when the battery is low, the ICD will be replaced.   This information is not intended to replace advice given to you by your health care provider. Make sure you discuss any questions you have with your health care provider.   Document Released: 10/17/2001 Document Revised: 11/15/2012 Document Reviewed: 02/14/2012 Elsevier Interactive Patient Education [employer].

## 2015-03-11 NOTE — Progress Notes (Signed)
HPI Tara Dunn is referred today by Dr. Aundra Dunn for consideration of ICD implant. She is a native of [place] and has moved down to live closer to her daughter. She has a non-ischemic CM, and chronic systolic heart failure, Ef 30% by echo/MRI. The patient has been on maximal medical therapy. She has LVH and IVCD and QRS of 130. No syncope. Her PR is short.  Allergies  Allergen Reactions  . Shellfish Allergy Hives     Current Outpatient Prescriptions  Medication Sig Dispense Refill  . allopurinol (ZYLOPRIM) 100 MG tablet Take 100 mg by mouth daily.  0  . amLODipine (NORVASC) 5 MG tablet Take 5 mg by mouth daily.  3  . aspirin EC 81 MG tablet Take 1 tablet (81 mg total) by mouth daily. 90 tablet 3  . atorvastatin (LIPITOR) 40 MG tablet Take 1 tablet (40 mg total) by mouth daily. 90 tablet 3  . carvedilol (COREG) 25 MG tablet Take 1 tablet (25 mg total) by mouth 2 (two) times daily. 60 tablet 6  . colchicine 0.6 MG tablet Take 1 tablet (0.6 mg total) by mouth daily as needed. 30 tablet 3  . furosemide (LASIX) 20 MG tablet Take 1 tablet (20 mg total) by mouth as needed for fluid (Weight gain of more than 5 pounds.). 30 tablet 0  . hydrALAZINE (APRESOLINE) 50 MG tablet Take 1.5 tablets (75 mg total) by mouth 3 (three) times daily. 270 tablet 3  . isosorbide mononitrate (IMDUR) 60 MG 24 hr tablet Take 1.5 tablets (90 mg total) by mouth daily. 270 tablet 3  . omeprazole (PRILOSEC) 40 MG capsule Take 40 mg by mouth daily.  0  . spironolactone (ALDACTONE) 25 MG tablet Take 1 tablet (25 mg total) by mouth daily. 90 tablet 3   No current facility-administered medications for this visit.     Past Medical History  Diagnosis Date  . Hypertension     a. Dx @ age 59;  b. 04/2014 admission for HTN emergency.  . Cardiomyopathy ([place])     a. 04/2014 Echo: EF 25-30%, mod conc LVH, possibl antsept HK, Gr1 DD, mod-sev dil LA.  Marland Kitchen History of stroke     a. 3 strokes - last in 1998.  . Aortic  dilatation ([place])     a. 04/2014 CTA chest w/ incidental finding of distal thoracic Ao enlargement of 3.39 mm - f/u needed 04/2015.  . Breast cancer ([place])     a. 2009 s/p L mastectomy and chemo.  . CKD (chronic kidney disease), stage III   . Gout   . Obesity   . CHF (congestive heart failure) ([place])   . Stroke [facility])     pt states had 3 strokes   . Obesity (BMI 30-39.9) 01/21/2015    ROS:   All systems reviewed and negative except as noted in the HPI.   Past Surgical History  Procedure Laterality Date  . Mastectomy Left   . Abdominal hysterectomy    . Left heart catheterization with coronary angiogram N/A 05/07/2014    Procedure: LEFT HEART CATHETERIZATION WITH CORONARY ANGIOGRAM;  Surgeon: Belva Crome, MD;  Location: [facility] CATH LAB;  Service: Cardiovascular;  Laterality: N/A;  . Breast surgery      left breast removed      Family History  Problem Relation Age of Onset  . High blood pressure Mother     Died @ 44.  . High blood pressure Father   .  Heart attack Father     Died in his early 44's.  . High blood pressure Sister      Social History   Social History  . Marital Status: Widowed    Spouse Name: N/A  . Number of Children: N/A  . Years of Education: N/A   Occupational History  . Not on file.   Social History Main Topics  . Smoking status: Former Smoker -- 0.50 packs/day for 40 years    Types: Cigarettes  . Smokeless tobacco: Not on file     Comment: Quit in 2009.  Marland Kitchen Alcohol Use: No  . Drug Use: No  . Sexual Activity: Not on file   Other Topics Concern  . Not on file   Social History Narrative   Lives in [place].  Son and Sister nearby.  Moved here from [place], [place] in Fall of 2015.  Does not routinely exericse.     BP 142/98 mmHg  Pulse 64  Ht 5\' 4"  (1.626 m)  Wt 212 lb 12.8 oz (96.525 kg)  BMI 36.51 kg/m2  Physical Exam:  Well appearing 75 yo woman, NAD HEENT: Unremarkable Neck:  No JVD, no thyromegally Lymphatics:  No adenopathy Back:  No CVA  tenderness Lungs:  Clear with no wheezes HEART:  Regular rate rhythm, no murmurs, no rubs, no clicks Abd:  soft, positive bowel sounds, no organomegally, no rebound, no guarding Ext:  2 plus pulses, no edema, no cyanosis, no clubbing Skin:  No rashes no nodules Neuro:  CN II through XII intact, motor grossly intact  EKG - nsr with LVH and IVCD  Assess/Plan:

## 2015-03-21 DIAGNOSIS — G4733 Obstructive sleep apnea (adult) (pediatric): Secondary | ICD-10-CM | POA: Diagnosis not present

## 2015-03-25 ENCOUNTER — Other Ambulatory Visit: Payer: Self-pay | Admitting: Physician Assistant

## 2015-03-26 ENCOUNTER — Encounter (HOSPITAL_COMMUNITY)
Admission: RE | Disposition: A | Discharge status: Home / Self Care | Payer: Self-pay | Source: Ambulatory Visit | Attending: Internal Medicine

## 2015-03-26 ENCOUNTER — Ambulatory Visit (HOSPITAL_COMMUNITY)
Admission: RE | Admit: 2015-03-26 | Discharge: 2015-03-27 | Disposition: A | Discharge status: Home / Self Care | Payer: PPO | Source: Ambulatory Visit | Attending: Internal Medicine | Admitting: Internal Medicine

## 2015-03-26 ENCOUNTER — Encounter (HOSPITAL_COMMUNITY): Payer: Self-pay | Admitting: Internal Medicine

## 2015-03-26 DIAGNOSIS — N183 Chronic kidney disease, stage 3 (moderate): Secondary | ICD-10-CM | POA: Diagnosis not present

## 2015-03-26 DIAGNOSIS — Z9010 Acquired absence of unspecified breast and nipple: Secondary | ICD-10-CM | POA: Insufficient documentation

## 2015-03-26 DIAGNOSIS — Z7982 Long term (current) use of aspirin: Secondary | ICD-10-CM | POA: Diagnosis not present

## 2015-03-26 DIAGNOSIS — I10 Essential (primary) hypertension: Secondary | ICD-10-CM | POA: Diagnosis not present

## 2015-03-26 DIAGNOSIS — Z8673 Personal history of transient ischemic attack (TIA), and cerebral infarction without residual deficits: Secondary | ICD-10-CM | POA: Insufficient documentation

## 2015-03-26 DIAGNOSIS — I129 Hypertensive chronic kidney disease with stage 1 through stage 4 chronic kidney disease, or unspecified chronic kidney disease: Secondary | ICD-10-CM | POA: Diagnosis not present

## 2015-03-26 DIAGNOSIS — I429 Cardiomyopathy, unspecified: Secondary | ICD-10-CM | POA: Diagnosis not present

## 2015-03-26 DIAGNOSIS — I5022 Chronic systolic (congestive) heart failure: Secondary | ICD-10-CM | POA: Diagnosis not present

## 2015-03-26 DIAGNOSIS — Z87891 Personal history of nicotine dependence: Secondary | ICD-10-CM | POA: Insufficient documentation

## 2015-03-26 DIAGNOSIS — Z95810 Presence of automatic (implantable) cardiac defibrillator: Secondary | ICD-10-CM

## 2015-03-26 HISTORY — DX: Gastro-esophageal reflux disease without esophagitis: K21.9

## 2015-03-26 HISTORY — DX: Personal history of other diseases of the digestive system: Z87.19

## 2015-03-26 HISTORY — DX: Personal history of peptic ulcer disease: Z87.11

## 2015-03-26 HISTORY — DX: Presence of automatic (implantable) cardiac defibrillator: Z95.810

## 2015-03-26 HISTORY — DX: Unspecified osteoarthritis, unspecified site: M19.90

## 2015-03-26 HISTORY — DX: Malignant neoplasm of unspecified site of left female breast: C50.912

## 2015-03-26 HISTORY — DX: Cardiac murmur, unspecified: R01.1

## 2015-03-26 HISTORY — DX: Major depressive disorder, single episode, unspecified: F32.9

## 2015-03-26 HISTORY — DX: Obstructive sleep apnea (adult) (pediatric): G47.33

## 2015-03-26 HISTORY — DX: Depression, unspecified: F32.A

## 2015-03-26 HISTORY — PX: EP IMPLANTABLE DEVICE: SHX172B

## 2015-03-26 HISTORY — DX: Dependence on other enabling machines and devices: Z99.89

## 2015-03-26 LAB — SURGICAL PCR SCREEN
MRSA, PCR: NEGATIVE
Staphylococcus aureus: POSITIVE — AB

## 2015-03-26 SURGERY — ICD IMPLANT
Anesthesia: LOCAL

## 2015-03-26 MED ORDER — SPIRONOLACTONE 25 MG PO TABS
25.0000 mg | ORAL_TABLET | Freq: Every day | ORAL | Status: DC
  Administered 2015-03-26 – 2015-03-27 (×2): 25 mg via ORAL
  Filled 2015-03-26 (×2): qty 1

## 2015-03-26 MED ORDER — PANTOPRAZOLE SODIUM 40 MG PO TBEC
40.0000 mg | DELAYED_RELEASE_TABLET | Freq: Every day | ORAL | Status: DC
  Administered 2015-03-26 – 2015-03-27 (×2): 40 mg via ORAL
  Filled 2015-03-26 (×2): qty 1

## 2015-03-26 MED ORDER — SODIUM CHLORIDE 0.9 % IR SOLN
80.0000 mg | Status: AC
  Administered 2015-03-26: 80 mg

## 2015-03-26 MED ORDER — AMLODIPINE BESYLATE 5 MG PO TABS
5.0000 mg | ORAL_TABLET | Freq: Every day | ORAL | Status: DC
  Administered 2015-03-26 – 2015-03-27 (×2): 5 mg via ORAL
  Filled 2015-03-26 (×2): qty 1

## 2015-03-26 MED ORDER — LIDOCAINE HCL (PF) 1 % IJ SOLN
INTRAMUSCULAR | Status: DC | PRN
  Administered 2015-03-26: 42 mL via INTRADERMAL

## 2015-03-26 MED ORDER — FENTANYL CITRATE (PF) 100 MCG/2ML IJ SOLN
INTRAMUSCULAR | Status: DC | PRN
  Administered 2015-03-26 (×2): 12.5 ug via INTRAVENOUS
  Administered 2015-03-26 (×2): 25 ug via INTRAVENOUS
  Administered 2015-03-26: 12.5 ug via INTRAVENOUS

## 2015-03-26 MED ORDER — CEFAZOLIN SODIUM-DEXTROSE 2-3 GM-% IV SOLR
2.0000 g | INTRAVENOUS | Status: AC
  Administered 2015-03-26: 2 g via INTRAVENOUS

## 2015-03-26 MED ORDER — MUPIROCIN 2 % EX OINT
TOPICAL_OINTMENT | CUTANEOUS | Status: AC
  Administered 2015-03-26: 12:00:00 via NASAL
  Filled 2015-03-26: qty 22

## 2015-03-26 MED ORDER — SODIUM CHLORIDE 0.9% FLUSH: 3.0000 mL | INTRAVENOUS | Status: DC | PRN

## 2015-03-26 MED ORDER — FENTANYL CITRATE (PF) 100 MCG/2ML IJ SOLN
INTRAMUSCULAR | Status: AC
  Filled 2015-03-26: qty 2

## 2015-03-26 MED ORDER — ONDANSETRON HCL 4 MG/2ML IJ SOLN: 4.0000 mg | Freq: Four times a day (QID) | INTRAMUSCULAR | Status: DC | PRN

## 2015-03-26 MED ORDER — SODIUM CHLORIDE 0.9 % IV SOLN
INTRAVENOUS | Status: DC
  Administered 2015-03-26: 11:00:00 via INTRAVENOUS

## 2015-03-26 MED ORDER — ISOSORBIDE MONONITRATE ER 60 MG PO TB24
90.0000 mg | ORAL_TABLET | Freq: Every day | ORAL | Status: DC
  Administered 2015-03-26 – 2015-03-27 (×2): 90 mg via ORAL
  Filled 2015-03-26 (×2): qty 1

## 2015-03-26 MED ORDER — MIDAZOLAM HCL 5 MG/5ML IJ SOLN
INTRAMUSCULAR | Status: DC | PRN
  Administered 2015-03-26: 1 mg via INTRAVENOUS
  Administered 2015-03-26: 2 mg via INTRAVENOUS
  Administered 2015-03-26 (×4): 1 mg via INTRAVENOUS

## 2015-03-26 MED ORDER — MIDAZOLAM HCL 5 MG/5ML IJ SOLN
INTRAMUSCULAR | Status: AC
  Filled 2015-03-26: qty 5

## 2015-03-26 MED ORDER — SODIUM CHLORIDE 0.9% FLUSH: 3.0000 mL | Freq: Two times a day (BID) | INTRAVENOUS | Status: DC

## 2015-03-26 MED ORDER — ALLOPURINOL 100 MG PO TABS
100.0000 mg | ORAL_TABLET | Freq: Every day | ORAL | Status: DC
  Administered 2015-03-26 – 2015-03-27 (×2): 100 mg via ORAL
  Filled 2015-03-26 (×2): qty 1

## 2015-03-26 MED ORDER — GUAIFENESIN 100 MG/5ML PO SOLN
5.0000 mL | ORAL | Status: DC | PRN
  Administered 2015-03-26 – 2015-03-27 (×2): 100 mg via ORAL
  Filled 2015-03-26 (×2): qty 5

## 2015-03-26 MED ORDER — CEFAZOLIN SODIUM 1-5 GM-% IV SOLN
1.0000 g | Freq: Four times a day (QID) | INTRAVENOUS | Status: AC
  Administered 2015-03-26 – 2015-03-27 (×3): 1 g via INTRAVENOUS
  Filled 2015-03-26 (×3): qty 50

## 2015-03-26 MED ORDER — HEPARIN (PORCINE) IN NACL 2-0.9 UNIT/ML-% IJ SOLN
INTRAMUSCULAR | Status: DC | PRN
  Administered 2015-03-26: 13:00:00

## 2015-03-26 MED ORDER — ATORVASTATIN CALCIUM 40 MG PO TABS
40.0000 mg | ORAL_TABLET | Freq: Every day | ORAL | Status: DC
  Administered 2015-03-26: 40 mg via ORAL
  Filled 2015-03-26: qty 1

## 2015-03-26 MED ORDER — GENTAMICIN SULFATE 40 MG/ML IJ SOLN
INTRAMUSCULAR | Status: AC
  Filled 2015-03-26: qty 2

## 2015-03-26 MED ORDER — ACETAMINOPHEN 325 MG PO TABS
325.0000 mg | ORAL_TABLET | ORAL | Status: DC | PRN
  Administered 2015-03-26: 650 mg via ORAL
  Filled 2015-03-26: qty 2

## 2015-03-26 MED ORDER — CEFAZOLIN SODIUM-DEXTROSE 2-3 GM-% IV SOLR
INTRAVENOUS | Status: AC
  Filled 2015-03-26: qty 50

## 2015-03-26 MED ORDER — HYDRALAZINE HCL 50 MG PO TABS
75.0000 mg | ORAL_TABLET | Freq: Three times a day (TID) | ORAL | Status: DC
  Administered 2015-03-26 – 2015-03-27 (×3): 75 mg via ORAL
  Filled 2015-03-26 (×3): qty 1

## 2015-03-26 MED ORDER — FUROSEMIDE 20 MG PO TABS: 20.0000 mg | ORAL_TABLET | ORAL | Status: DC | PRN

## 2015-03-26 MED ORDER — OFF THE BEAT BOOK
Freq: Once | Status: AC
Start: 2015-03-26 — End: 2015-03-26
  Administered 2015-03-26: 16:00:00
  Filled 2015-03-26: qty 1

## 2015-03-26 MED ORDER — CHLORHEXIDINE GLUCONATE 4 % EX LIQD
60.0000 mL | Freq: Once | CUTANEOUS | Status: DC
  Filled 2015-03-26: qty 60

## 2015-03-26 MED ORDER — CARVEDILOL 25 MG PO TABS
25.0000 mg | ORAL_TABLET | Freq: Two times a day (BID) | ORAL | Status: DC
  Administered 2015-03-26 – 2015-03-27 (×2): 25 mg via ORAL
  Filled 2015-03-26: qty 1
  Filled 2015-03-26: qty 2
  Filled 2015-03-26 (×3): qty 1

## 2015-03-26 MED ORDER — ASPIRIN EC 81 MG PO TBEC
81.0000 mg | DELAYED_RELEASE_TABLET | Freq: Every day | ORAL | Status: DC
  Administered 2015-03-27: 10:00:00 81 mg via ORAL
  Filled 2015-03-26: qty 1

## 2015-03-26 MED ORDER — LIDOCAINE HCL (PF) 1 % IJ SOLN
INTRAMUSCULAR | Status: AC
  Filled 2015-03-26: qty 60

## 2015-03-26 MED ORDER — HEPARIN (PORCINE) IN NACL 2-0.9 UNIT/ML-% IJ SOLN
INTRAMUSCULAR | Status: AC
  Filled 2015-03-26: qty 500

## 2015-03-26 MED ORDER — SODIUM CHLORIDE 0.9 % IV SOLN: 250.0000 mL | INTRAVENOUS | Status: DC

## 2015-03-26 SURGICAL SUPPLY — 8 items
CABLE SURGICAL S-101-97-12 (CABLE) ×2 IMPLANT
GUIDEWIRE ANGLED .035X150CM (WIRE) ×2 IMPLANT
ICD VISIA MRI VR DVFB1D4 (ICD Generator) ×1 IMPLANT
LEAD SPRINT QUAT SEC 6935M-55 (Lead) ×2 IMPLANT
PAD DEFIB LIFELINK (PAD) ×2 IMPLANT
SHEATH CLASSIC 9F (SHEATH) ×2 IMPLANT
TRAY PACEMAKER INSERTION (PACKS) ×2 IMPLANT
VISIA MRI VR DVFB1D4 (ICD Generator) ×2 IMPLANT

## 2015-03-26 NOTE — H&P (View-Only) (Signed)
HPI Tara Dunn is referred today by Dr. Aundra Dubin for consideration of ICD implant. She is a native of [place] and has moved down to live closer to her daughter. She has a non-ischemic CM, and chronic systolic heart failure, Ef 30% by echo/MRI. The patient has been on maximal medical therapy. She has LVH and IVCD and QRS of 130. No syncope. Her PR is short.  Allergies  Allergen Reactions  . Shellfish Allergy Hives     Current Outpatient Prescriptions  Medication Sig Dispense Refill  . allopurinol (ZYLOPRIM) 100 MG tablet Take 100 mg by mouth daily.  0  . amLODipine (NORVASC) 5 MG tablet Take 5 mg by mouth daily.  3  . aspirin EC 81 MG tablet Take 1 tablet (81 mg total) by mouth daily. 90 tablet 3  . atorvastatin (LIPITOR) 40 MG tablet Take 1 tablet (40 mg total) by mouth daily. 90 tablet 3  . carvedilol (COREG) 25 MG tablet Take 1 tablet (25 mg total) by mouth 2 (two) times daily. 60 tablet 6  . colchicine 0.6 MG tablet Take 1 tablet (0.6 mg total) by mouth daily as needed. 30 tablet 3  . furosemide (LASIX) 20 MG tablet Take 1 tablet (20 mg total) by mouth as needed for fluid (Weight gain of more than 5 pounds.). 30 tablet 0  . hydrALAZINE (APRESOLINE) 50 MG tablet Take 1.5 tablets (75 mg total) by mouth 3 (three) times daily. 270 tablet 3  . isosorbide mononitrate (IMDUR) 60 MG 24 hr tablet Take 1.5 tablets (90 mg total) by mouth daily. 270 tablet 3  . omeprazole (PRILOSEC) 40 MG capsule Take 40 mg by mouth daily.  0  . spironolactone (ALDACTONE) 25 MG tablet Take 1 tablet (25 mg total) by mouth daily. 90 tablet 3   No current facility-administered medications for this visit.     Past Medical History  Diagnosis Date  . Hypertension     a. Dx @ age 58;  b. 04/2014 admission for HTN emergency.  . Cardiomyopathy ([place])     a. 04/2014 Echo: EF 25-30%, mod conc LVH, possibl antsept HK, Gr1 DD, mod-sev dil LA.  Marland Kitchen History of stroke     a. 3 strokes - last in 1998.  . Aortic  dilatation ([place])     a. 04/2014 CTA chest w/ incidental finding of distal thoracic Ao enlargement of 3.39 mm - f/u needed 04/2015.  . Breast cancer ([place])     a. 2009 s/p L mastectomy and chemo.  . CKD (chronic kidney disease), stage III   . Gout   . Obesity   . CHF (congestive heart failure) ([place])   . Stroke [facility])     pt states had 3 strokes   . Obesity (BMI 30-39.9) 01/21/2015    ROS:   All systems reviewed and negative except as noted in the HPI.   Past Surgical History  Procedure Laterality Date  . Mastectomy Left   . Abdominal hysterectomy    . Left heart catheterization with coronary angiogram N/A 05/07/2014    Procedure: LEFT HEART CATHETERIZATION WITH CORONARY ANGIOGRAM;  Surgeon: Belva Crome, MD;  Location: [facility] CATH LAB;  Service: Cardiovascular;  Laterality: N/A;  . Breast surgery      left breast removed      Family History  Problem Relation Age of Onset  . High blood pressure Mother     Died @ 49.  . High blood pressure Father   .  Heart attack Father     Died in his early 63's.  . High blood pressure Sister      Social History   Social History  . Marital Status: Widowed    Spouse Name: N/A  . Number of Children: N/A  . Years of Education: N/A   Occupational History  . Not on file.   Social History Main Topics  . Smoking status: Former Smoker -- 0.50 packs/day for 40 years    Types: Cigarettes  . Smokeless tobacco: Not on file     Comment: Quit in 2009.  Marland Kitchen Alcohol Use: No  . Drug Use: No  . Sexual Activity: Not on file   Other Topics Concern  . Not on file   Social History Narrative   Lives in [place].  Son and Sister nearby.  Moved here from [place], [place] in Fall of 2015.  Does not routinely exericse.     BP 142/98 mmHg  Pulse 64  Ht 5\' 4"  (1.626 m)  Wt 212 lb 12.8 oz (96.525 kg)  BMI 36.51 kg/m2  Physical Exam:  Well appearing 75 yo woman, NAD HEENT: Unremarkable Neck:  No JVD, no thyromegally Lymphatics:  No adenopathy Back:  No CVA  tenderness Lungs:  Clear with no wheezes HEART:  Regular rate rhythm, no murmurs, no rubs, no clicks Abd:  soft, positive bowel sounds, no organomegally, no rebound, no guarding Ext:  2 plus pulses, no edema, no cyanosis, no clubbing Skin:  No rashes no nodules Neuro:  CN II through XII intact, motor grossly intact  EKG - nsr with LVH and IVCD  Assess/Plan:

## 2015-03-26 NOTE — Discharge Instructions (Signed)
° ° °  Supplemental Discharge Instructions for  Pacemaker/Defibrillator Patients  Activity No heavy lifting or vigorous activity with your left/right arm for 6 to 8 weeks.  Do not raise your left/right arm above your head for one week.  Gradually raise your affected arm as drawn below.              03/31/15                    04/01/15                     04/02/15                  04/03/15 __  NO DRIVING for 1 week    ; you may begin driving on  [id number]   .  WOUND CARE - Keep the wound area clean and dry.  Do not get this area wet for one week. No showers for one week; you may shower on 04/03/15    . - The tape/steri-strips on your wound will fall off; do not pull them off.  No bandage is needed on the site.  DO  NOT apply any creams, oils, or ointments to the wound area. - If you notice any drainage or discharge from the wound, any swelling or bruising at the site, or you develop a fever > 101? F after you are discharged home, call the office at once.  Special Instructions - You are still able to use cellular telephones; use the ear opposite the side where you have your pacemaker/defibrillator.  Avoid carrying your cellular phone near your device. - When traveling through airports, show security personnel your identification card to avoid being screened in the metal detectors.  Ask the security personnel to use the hand wand. - Avoid arc welding equipment, MRI testing (magnetic resonance imaging), TENS units (transcutaneous nerve stimulators).  Call the office for questions about other devices. - Avoid electrical appliances that are in poor condition or are not properly grounded. - Microwave ovens are safe to be near or to operate.  Additional information for defibrillator patients should your device go off: - If your device goes off ONCE and you feel fine afterward, notify the device clinic nurses. - If your device goes off ONCE and you do not feel well afterward, call 911. - If your device  goes off TWICE, call 911. - If your device goes off THREE times in one day, call 911.  DO NOT DRIVE YOURSELF OR A FAMILY MEMBER WITH A DEFIBRILLATOR TO THE HOSPITAL--CALL 911.

## 2015-03-26 NOTE — Interval H&P Note (Signed)
History and Physical Interval Note:  03/26/2015 11:33 AM  Tara Dunn  has presented today for surgery, with the diagnosis of chf, lv disfunction  The various methods of treatment have been discussed with the patient and family. After consideration of risks, benefits and other options for treatment, the patient has consented to  Procedure(s): ICD Implant (N/A) as a surgical intervention .  The patient's history has been reviewed, patient examined, no change in status, stable for surgery.  I have reviewed the patient's chart and labs.  Questions were answered to the patient's satisfaction.     Tara Dunn

## 2015-03-27 ENCOUNTER — Encounter (HOSPITAL_COMMUNITY): Payer: Self-pay | Admitting: Physician Assistant

## 2015-03-27 ENCOUNTER — Ambulatory Visit (HOSPITAL_COMMUNITY): Payer: PPO

## 2015-03-27 DIAGNOSIS — I429 Cardiomyopathy, unspecified: Secondary | ICD-10-CM | POA: Diagnosis not present

## 2015-03-27 DIAGNOSIS — Z950 Presence of cardiac pacemaker: Secondary | ICD-10-CM | POA: Diagnosis not present

## 2015-03-27 DIAGNOSIS — Z95810 Presence of automatic (implantable) cardiac defibrillator: Secondary | ICD-10-CM

## 2015-03-27 DIAGNOSIS — Z7982 Long term (current) use of aspirin: Secondary | ICD-10-CM | POA: Diagnosis not present

## 2015-03-27 DIAGNOSIS — I10 Essential (primary) hypertension: Secondary | ICD-10-CM | POA: Diagnosis not present

## 2015-03-27 DIAGNOSIS — I5022 Chronic systolic (congestive) heart failure: Secondary | ICD-10-CM | POA: Diagnosis not present

## 2015-03-27 DIAGNOSIS — I499 Cardiac arrhythmia, unspecified: Secondary | ICD-10-CM | POA: Diagnosis not present

## 2015-03-27 NOTE — Care Management Note (Signed)
Case Management Note  Patient Details  Name: Tara Dunn MRN: [id number] Date of Birth: 02-04-1941  Subjective/Objective:   Patient isif rom home with spouse, pta indep, no needs.                 Action/Plan:   Expected Discharge Date:                  Expected Discharge Plan:  Home/Self Care  In-House Referral:     Discharge planning Services  CM Consult  Post Acute Care Choice:    Choice offered to:     DME Arranged:    DME Agency:     HH Arranged:    [place] Agency:     Status of Service:  Completed, signed off  Medicare Important Message Given:    Date Medicare IM Given:    Medicare IM give by:    Date Additional Medicare IM Given:    Additional Medicare Important Message give by:     If discussed at [place] of Stay Meetings, dates discussed:    Additional Comments:  Zenon Mayo, RN 03/27/2015, 2:17 PM

## 2015-03-27 NOTE — Progress Notes (Signed)
Patient wants to get her Advance Directive papers signed and a notary is needed. Spoke to [employer] and she states that the chaplains are with other patient's at this time. Patient and patient's daughter are eager to leave. Amy states to inform them that they can get the papers signed at a bank for free from there notary approved personal. Patient states she will get it signed at the bank.

## 2015-03-27 NOTE — Progress Notes (Signed)
Patient ID: Dayne Theard, female   DOB: 04-14-1940, 75 y.o.   MRN: [id number] ICD Criteria  Current LVEF:32%. Within 12 months prior to implant: Yes   Heart failure history: Yes, Class II  Cardiomyopathy history: Yes, Non-Ischemic Cardiomyopathy.  Atrial Fibrillation/Atrial Flutter: No.  Ventricular tachycardia history: No.  Cardiac arrest history: No.  History of syndromes with risk of sudden death: No.  Previous ICD: No.  Current ICD indication: Primary  PPM indication: No.   Class I or II Bradycardia indication present: No  Beta Blocker therapy for 3 or more months: Yes, prescribed.   Ace Inhibitor/ARB therapy for 3 or more months: No, medical reason.

## 2015-03-27 NOTE — Discharge Summary (Signed)
ELECTROPHYSIOLOGY PROCEDURE DISCHARGE SUMMARY    Patient ID: Tara Dunn,  MRN: [id number], DOB/AGE: Aug 10, 1940 75 y.o.  Admit date: 03/26/2015 Discharge date: 03/27/2015  Primary Care Physician: Rachell Cipro, MD Primary Cardiologist: Dr. Aundra Dubin Electrophysiologist: Dr. Lovena Le  Primary Discharge Diagnosis:  NICM  Secondary Discharge Diagnosis:  HTN CAD (nonobstructive) CRI Chronic CHF (systolic) OSA  Allergies  Allergen Reactions  . Shellfish Allergy Hives     Procedures This Admission:  1.  Implantation of a MDT single chamber ICD on 03/26/15 by Dr Lovena Le.  The patient received a Medtronic (serial Number [id number]) ICD with a  Medtronic (serial # [id number] ) right ventricular defibrillator lead .  DFT's were deferred at time of successful at 65 J.  There were no immediate post procedure complications. 2.  CXR on demonstrated no pneumothorax status post device implantation. Her device was working normally.  Brief HPI: Tara Dunn is a 75 y.o. female was referred to electrophysiology in the outpatient setting for consideration of ICD implantation.  Past medical history includes NICM, chronic CHF, systolic, CRI, HTN, OSA.  The patient has persistent LV dysfunction despite guideline directed therapy.  Risks, benefits, and alternatives to ICD implantation were reviewed with the patient who wished to proceed.   Hospital Course:  The patient was admitted and underwent implantation of a ICD with details as outlined above. She was monitored on telemetry overnight which demonstrated NSR.  Left chest was without hematoma or ecchymosis.  The device was interrogated and found to be functioning normally.  CXR was obtained and demonstrated no pneumothorax status post device implantation.  Wound care, arm mobility, and restrictions were reviewed with the patient.  The patient was examined by Dr. Lovena Le and considered stable for discharge to home.   The patient's  discharge medications include  beta blocker (Carvedilol), no ACE secondary to CRI  Physical Exam: Filed Vitals:   03/26/15 1600 03/26/15 1700 03/26/15 1740 03/26/15 2014  BP: 171/68 158/69 158/69 112/51  Pulse: 80 72 78 79  Temp:    98.7 F (37.1 C)  TempSrc:    Oral  Resp: 19 18  20   Height:      Weight:      SpO2: 98% 98%  97%   GEN- The patient is well appearing, alert and oriented x 3 today.   HEENT: normocephalic, atraumatic; sclera clear, conjunctiva pink; hearing intact; oropharynx clear Lungs- Clear to ausculation bilaterally, normal work of breathing.  No wheezes, rales, rhonchi Heart- Regular rate and rhythm, no murmurs, rubs or gallops, PMI not laterally displaced GI- soft, non-tender, non-distended, bowel sounds present Extremities- no clubbing, cyanosis, or edema MS- no significant deformity or atrophy Skin- warm and dry, no rash or lesion, left chest without hematoma/ecchymosis Psych- euthymic mood, full affect Neuro- no gross defecits  Labs:   Lab Results  Component Value Date   WBC 4.4 03/11/2015   HGB 12.9 03/11/2015   HCT 39.2 03/11/2015   MCV 87.7 03/11/2015   PLT 196 03/11/2015   No results for input(s): NA, K, CL, CO2, BUN, CREATININE, CALCIUM, PROT, BILITOT, ALKPHOS, ALT, AST, GLUCOSE in the last 168 hours.  Invalid input(s): LABALBU  Discharge Medications:    Medication List    ASK your doctor about these medications        allopurinol 100 MG tablet  Commonly known as:  ZYLOPRIM  Take 100 mg by mouth daily.     amLODipine 5 MG tablet  Commonly known as:  NORVASC  Take 5 mg  by mouth daily.     aspirin EC 81 MG tablet  Take 1 tablet (81 mg total) by mouth daily.     atorvastatin 40 MG tablet  Commonly known as:  LIPITOR  Take 1 tablet (40 mg total) by mouth daily.     carvedilol 25 MG tablet  Commonly known as:  COREG  Take 1 tablet (25 mg total) by mouth 2 (two) times daily.     colchicine 0.6 MG tablet  Take 1 tablet (0.6 mg  total) by mouth daily as needed.     furosemide 20 MG tablet  Commonly known as:  LASIX  Take 1 tablet (20 mg total) by mouth as needed for fluid (Weight gain of more than 5 pounds.).     hydrALAZINE 50 MG tablet  Commonly known as:  APRESOLINE  Take 1.5 tablets (75 mg total) by mouth 3 (three) times daily.     isosorbide mononitrate 60 MG 24 hr tablet  Commonly known as:  IMDUR  Take 1.5 tablets (90 mg total) by mouth daily.     omeprazole 40 MG capsule  Commonly known as:  PRILOSEC  Take 40 mg by mouth daily.     spironolactone 25 MG tablet  Commonly known as:  ALDACTONE  Take 1 tablet (25 mg total) by mouth daily.        Disposition: Home      Follow-up Information    Follow up with [facility] On 04/07/2015.   Specialty:  Cardiology   Why:  9:00AM   Contact information:   [address] [place] [phone]      Follow up with Cristopher Peru, MD On 06/25/2015.   Specialty:  Cardiology   Why:  9:15AM   Contact information:   [id number] [place] [address] [phone]       Duration of Discharge Encounter: Greater than 30 minutes including physician time.  Venetia Night, PA-C 03/27/2015 4:22 AM  EP Attending  Patient seen and examined. Agree with the findings as noted above. She is stable for discharge home. Usual followup.   Mikle Bosworth.D.

## 2015-04-07 ENCOUNTER — Ambulatory Visit (INDEPENDENT_AMBULATORY_CARE_PROVIDER_SITE_OTHER): Payer: PPO | Admitting: *Deleted

## 2015-04-07 ENCOUNTER — Encounter: Payer: Self-pay | Admitting: Internal Medicine

## 2015-04-07 DIAGNOSIS — I5022 Chronic systolic (congestive) heart failure: Secondary | ICD-10-CM | POA: Diagnosis not present

## 2015-04-07 LAB — CUP PACEART INCLINIC DEVICE CHECK
Battery Remaining Longevity: 138 mo
Battery Voltage: 3.14 V
HIGH POWER IMPEDANCE MEASURED VALUE: 62 Ohm
Implantable Lead Location: 753860
Lead Channel Impedance Value: 380 Ohm
Lead Channel Impedance Value: 437 Ohm
Lead Channel Setting Pacing Amplitude: 3.5 V
Lead Channel Setting Pacing Pulse Width: 0.4 ms
MDC IDC LEAD IMPLANT DT: 20170215
MDC IDC MSMT LEADCHNL RV PACING THRESHOLD AMPLITUDE: 1 V
MDC IDC MSMT LEADCHNL RV PACING THRESHOLD PULSEWIDTH: 0.4 ms
MDC IDC MSMT LEADCHNL RV SENSING INTR AMPL: 27.125 mV
MDC IDC SESS DTM: 20170227093643
MDC IDC SET LEADCHNL RV SENSING SENSITIVITY: 0.3 mV
MDC IDC STAT BRADY RV PERCENT PACED: 0.01 %

## 2015-04-07 NOTE — Progress Notes (Signed)
Wound check appointment. Steri-strips removed. Wound without redness or edema. Incision edges approximated, wound well healed. Normal device function. Threshold, sensing, and impedances consistent with implant measurements. Device programmed at 3.5V for extra safety margin until 3 month visit. Histogram distribution appropriate for patient and level of activity. No AT/AF or ventricular arrhythmias noted. Patient educated about wound care, arm mobility, lifting restrictions, shock plan. ROV with GT 06/25/15.

## 2015-04-14 DIAGNOSIS — G629 Polyneuropathy, unspecified: Secondary | ICD-10-CM | POA: Diagnosis not present

## 2015-04-14 DIAGNOSIS — I10 Essential (primary) hypertension: Secondary | ICD-10-CM | POA: Diagnosis not present

## 2015-04-14 DIAGNOSIS — M7742 Metatarsalgia, left foot: Secondary | ICD-10-CM | POA: Diagnosis not present

## 2015-04-14 DIAGNOSIS — E782 Mixed hyperlipidemia: Secondary | ICD-10-CM | POA: Diagnosis not present

## 2015-04-17 ENCOUNTER — Ambulatory Visit (HOSPITAL_COMMUNITY)
Admission: RE | Admit: 2015-04-17 | Discharge: 2015-04-17 | Disposition: A | Discharge status: Home / Self Care | Payer: PPO | Source: Ambulatory Visit | Attending: Internal Medicine | Admitting: Internal Medicine

## 2015-04-17 ENCOUNTER — Encounter (HOSPITAL_COMMUNITY): Payer: Self-pay

## 2015-04-17 VITALS — BP 152/78 | HR 67 | Wt 212.5 lb

## 2015-04-17 DIAGNOSIS — I2510 Atherosclerotic heart disease of native coronary artery without angina pectoris: Secondary | ICD-10-CM | POA: Insufficient documentation

## 2015-04-17 DIAGNOSIS — Z853 Personal history of malignant neoplasm of breast: Secondary | ICD-10-CM | POA: Insufficient documentation

## 2015-04-17 DIAGNOSIS — I130 Hypertensive heart and chronic kidney disease with heart failure and stage 1 through stage 4 chronic kidney disease, or unspecified chronic kidney disease: Secondary | ICD-10-CM | POA: Diagnosis not present

## 2015-04-17 DIAGNOSIS — Z7982 Long term (current) use of aspirin: Secondary | ICD-10-CM | POA: Insufficient documentation

## 2015-04-17 DIAGNOSIS — N183 Chronic kidney disease, stage 3 unspecified: Secondary | ICD-10-CM

## 2015-04-17 DIAGNOSIS — M545 Low back pain: Secondary | ICD-10-CM | POA: Diagnosis not present

## 2015-04-17 DIAGNOSIS — Z95810 Presence of automatic (implantable) cardiac defibrillator: Secondary | ICD-10-CM | POA: Diagnosis not present

## 2015-04-17 DIAGNOSIS — I10 Essential (primary) hypertension: Secondary | ICD-10-CM | POA: Diagnosis not present

## 2015-04-17 DIAGNOSIS — Z9221 Personal history of antineoplastic chemotherapy: Secondary | ICD-10-CM | POA: Diagnosis not present

## 2015-04-17 DIAGNOSIS — Z87891 Personal history of nicotine dependence: Secondary | ICD-10-CM | POA: Insufficient documentation

## 2015-04-17 DIAGNOSIS — I5022 Chronic systolic (congestive) heart failure: Secondary | ICD-10-CM

## 2015-04-17 DIAGNOSIS — N189 Chronic kidney disease, unspecified: Secondary | ICD-10-CM | POA: Diagnosis not present

## 2015-04-17 DIAGNOSIS — Z79899 Other long term (current) drug therapy: Secondary | ICD-10-CM | POA: Diagnosis not present

## 2015-04-17 DIAGNOSIS — Z8673 Personal history of transient ischemic attack (TIA), and cerebral infarction without residual deficits: Secondary | ICD-10-CM | POA: Diagnosis not present

## 2015-04-17 DIAGNOSIS — G4733 Obstructive sleep apnea (adult) (pediatric): Secondary | ICD-10-CM | POA: Diagnosis not present

## 2015-04-17 DIAGNOSIS — I428 Other cardiomyopathies: Secondary | ICD-10-CM | POA: Diagnosis not present

## 2015-04-17 DIAGNOSIS — Z8249 Family history of ischemic heart disease and other diseases of the circulatory system: Secondary | ICD-10-CM | POA: Insufficient documentation

## 2015-04-17 MED ORDER — TRAMADOL HCL 50 MG PO TABS: 50.0000 mg | ORAL_TABLET | ORAL | Status: DC | PRN

## 2015-04-17 MED ORDER — LOSARTAN POTASSIUM 25 MG PO TABS: 25.0000 mg | ORAL_TABLET | Freq: Every day | ORAL | Status: DC

## 2015-04-17 NOTE — Progress Notes (Signed)
Patient ID: Tara Dunn, female   DOB: 11-13-1940, 75 y.o.   MRN: [id number] PCP: Dr. Ernie Hew  75 y.o. with long history of HTN, CVAs, CKD, and systolic CHF.  She moved to [place] from [place] in 2015.   In 3/16, she was admitted a hypertensive emergency and acute CHF.  CTA chest showed no PE.  Echo showed EF 25-30% with moderate LVH.  She was diuresed and had a cardiac catheterization given extensive coronary calcification seen on CTA chest.  Cath showed diffuse moderate CAD, worst was 50-70% mRCA stenosis.   Cardiac MRI in 12/16 showed EF 32%.  ICD was placed in 2/17.    No recent presyncopal spells.  Event monitor in 1/17 showed no significant arrhythmias and she now has an ICD.  She slipped off her bed recently and hurt her back, using wheelchair now for longer distances (cane in the house).  Also having left great toe gout flare (improving on colchicine).  She is short of breath walking up steps or inclines.  OK going slow on flat ground for 50-100 yards but mostly limited by back and toe at this time.  She developed a cough on lisinopril so stopped it.  BP high today.   Labs (3/16): K 4.2, creatinine 1.44, HCT 39.7 Labs (06/05/14): K 4.1, creatinine 2.01 Labs (5/16): K 4.7, creatinine 1.56 Labs (7/16): K 4.7, creatinine 1.68, BNP 14, LDL 62 Labs (11/16): K 4.2, creatinine 1.93 Labs (1/17): K 4.6, creatinine 1.65, HCT 39.2  PMH: 1. CVA: 3 prior CVAs, last in 1998.  Thought to be related to HTN. 2. HTN: Long-standing, says she has been hypertensive since age 75. Renal artery dopplers (4/16) were normal.  3. CKD: Suspect related to HTN. 4. Breast cancer: 2009, left mastectomy with chemotherapy.  5. Gout 6. Obese 7. TAH 8. CAD: LHC (3/16) with moderate diffuse disease, worst lesion being 50-70% mid RCA.  9. Cardiomyopathy: Echo (3/16) with EF 25-30%, moderate LVH.  Echo (6/16) with EF 35-40%, moderate LVH, diffuse hypokinesis with septal-lateral dyssynchrony.  Cardiac MRI (12/16) with  EF 32%, diffuse hypokinesis, no contrast given due to CKD.  - Medtronic ICD 2/17.  10. OSA: Using CPAP.  11. Event monitor (1/17) with NSR, occasional PVCs.   SH: From [place], moved to [place] in 2015 to be near daughter.  Single.  Quit smoking 2009.    FH: HTN runs in family.  Father with MI.   ROS: All systems reviewed and negative except as per HPI.   Current Outpatient Prescriptions  Medication Sig Dispense Refill  . allopurinol (ZYLOPRIM) 100 MG tablet Take 100 mg by mouth daily.  0  . amLODipine (NORVASC) 5 MG tablet Take 5 mg by mouth daily.  3  . aspirin EC 81 MG tablet Take 1 tablet (81 mg total) by mouth daily. 90 tablet 3  . atorvastatin (LIPITOR) 40 MG tablet Take 1 tablet (40 mg total) by mouth daily. 90 tablet 3  . carvedilol (COREG) 25 MG tablet Take 1 tablet (25 mg total) by mouth 2 (two) times daily. 60 tablet 6  . colchicine 0.6 MG tablet Take 1 tablet (0.6 mg total) by mouth daily as needed. (Patient taking differently: Take 0.6 mg by mouth daily as needed (gout). ) 30 tablet 3  . furosemide (LASIX) 20 MG tablet Take 1 tablet (20 mg total) by mouth as needed for fluid (Weight gain of more than 5 pounds.). 30 tablet 0  . gabapentin (NEURONTIN) 300 MG capsule Take 300 mg  by mouth 3 (three) times daily.    . hydrALAZINE (APRESOLINE) 50 MG tablet Take 1.5 tablets (75 mg total) by mouth 3 (three) times daily. 270 tablet 3  . isosorbide mononitrate (IMDUR) 60 MG 24 hr tablet Take 1.5 tablets (90 mg total) by mouth daily. 270 tablet 3  . omeprazole (PRILOSEC) 40 MG capsule Take 40 mg by mouth daily.  0  . spironolactone (ALDACTONE) 25 MG tablet Take 1 tablet (25 mg total) by mouth daily. 90 tablet 3  . losartan (COZAAR) 25 MG tablet Take 1 tablet (25 mg total) by mouth daily. 30 tablet 3  . traMADol (ULTRAM) 50 MG tablet Take 1 tablet (50 mg total) by mouth as needed (twice daily as needed for pain). 15 tablet 0   No current facility-administered medications for this encounter.   BP  152/78 mmHg  Pulse 67  Wt 212 lb 8 oz (96.389 kg)  SpO2 100% General: NAD Neck: No JVD, no thyromegaly or thyroid nodule.  Lungs: Clear to auscultation bilaterally with normal respiratory effort. CV: Nondisplaced PMI.  Heart regular S1/S2, +S4, no murmur.  Trace ankle edema.  No carotid bruit.  Normal pedal pulses.  Abdomen: Soft, nontender, no hepatosplenomegaly, no distention.  Skin: Intact without lesions or rashes.  Neurologic: Alert and oriented x 3.  Psych: Normal affect. Extremities: No clubbing or cyanosis.  HEENT: Normal.   Assessment/Plan: 1. HTN: Long-standing, severe, poorly controlled over time but better now.  Has had history of CVA and CKD.  Renal artery dopplers were normal. BP still high, she stopped lisinopril due to cough.  Start losartan 25 mg daily today.  2. CAD: Moderate diffuse CAD, no severe obstruction.  This did not cause her cardiomyopathy.  No chest pain.  - Continue ASA 81 - Continue atorvastatin, good lipids 7/16. 3. CKD: Check BMET today.  4. 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  She does not look volume overloaded on exam. NYHA class II-III probably, more limited by low back pain and gout currently than CHF.  EF has been persistently low, 32% by cardiac MRI in 12/16.  Unable to give contrast to assess for scar/infiltrative disease due to CKD.  She had Medtronic ICD placed in 2/17.   - She has had Lasix for prn use, takes once or twice a week.  - Continue current Coreg, hydralazine/Imdur and spironolactone.   - As above, adding losartan 25 mg daily with BMET/BNP today and again in 10 days.  If she tolerates losartan ok,will eventually transition over to [place].  - I suggested that she start cardiac rehab to try to get more activity.  5. Low back pain: Avoid NSAIDs.  I will give her a prescription for a short course of tramadol.   Followup in 1 month.   Loralie Champagne 04/17/2015

## 2015-04-17 NOTE — Patient Instructions (Addendum)
Routine lab work today. Will notify you of abnormal results  Repeat labs in 10 days (bmet)  Start Losartan 25mg  daily.  Take Tramadol twice daily as needed for pain.  Follow up in 1 month with [place]  Your provider has referred you to cardiac rehab. They will call you to schedule an appointment.

## 2015-04-18 DIAGNOSIS — G4733 Obstructive sleep apnea (adult) (pediatric): Secondary | ICD-10-CM | POA: Diagnosis not present

## 2015-04-25 ENCOUNTER — Ambulatory Visit (HOSPITAL_COMMUNITY)
Admission: RE | Admit: 2015-04-25 | Discharge: 2015-04-25 | Disposition: A | Discharge status: Home / Self Care | Payer: PPO | Source: Ambulatory Visit | Attending: Cardiology | Admitting: Cardiology

## 2015-04-25 DIAGNOSIS — I5022 Chronic systolic (congestive) heart failure: Secondary | ICD-10-CM

## 2015-04-25 LAB — BASIC METABOLIC PANEL
ANION GAP: 11 (ref 5–15)
BUN: 28 mg/dL — AB (ref 6–20)
CHLORIDE: 106 mmol/L (ref 101–111)
CO2: 22 mmol/L (ref 22–32)
Calcium: 10.6 mg/dL — ABNORMAL HIGH (ref 8.9–10.3)
Creatinine, Ser: 1.67 mg/dL — ABNORMAL HIGH (ref 0.44–1.00)
GFR calc Af Amer: 33 mL/min — ABNORMAL LOW (ref >60)
GFR calc non Af Amer: 29 mL/min — ABNORMAL LOW (ref >60)
GLUCOSE: 94 mg/dL (ref 65–99)
POTASSIUM: 4.9 mmol/L (ref 3.5–5.1)
Sodium: 139 mmol/L (ref 135–145)

## 2015-04-25 LAB — BRAIN NATRIURETIC PEPTIDE: B NATRIURETIC PEPTIDE 5: 26.6 pg/mL (ref 0.0–100.0)

## 2015-04-28 ENCOUNTER — Other Ambulatory Visit (HOSPITAL_COMMUNITY): Payer: PPO

## 2015-05-07 ENCOUNTER — Encounter: Payer: Self-pay | Admitting: Neurology

## 2015-05-07 ENCOUNTER — Ambulatory Visit (INDEPENDENT_AMBULATORY_CARE_PROVIDER_SITE_OTHER): Payer: PPO | Admitting: Neurology

## 2015-05-07 VITALS — BP 130/78 | HR 75 | Ht 64.0 in | Wt 213.3 lb

## 2015-05-07 DIAGNOSIS — M79672 Pain in left foot: Secondary | ICD-10-CM | POA: Diagnosis not present

## 2015-05-07 DIAGNOSIS — M21612 Bunion of left foot: Secondary | ICD-10-CM

## 2015-05-07 DIAGNOSIS — R202 Paresthesia of skin: Secondary | ICD-10-CM | POA: Diagnosis not present

## 2015-05-07 NOTE — Progress Notes (Signed)
Tara Dunn   Date: 05/07/2015  Tara Dunn MRN: [id number] DOB: Aug 04, 1940   Dear Dr. Ernie Hew:  Thank you for your kind referral of Tara Dunn for consultation of left foot pain. Although her history is well known to you, please allow [place] to reiterate it for the purpose of our medical record. The patient was accompanied to the clinic by self.   History of Present Illness: Tara Dunn is a 75 y.o. right-handed [place] American female with hypertension, hyperlipidemia, cardiomyopathy, gout, CHF s/p ICD, left breast cancer s/p radiation, chemotherapy, and mastectomy (2009), CKD, stroke (1998, no residual symptoms) and GERD presenting for evaluation of left foot pain.    Around the late 1990s, she began having sharp pain over the left metatarsal head involving the first two toes. There is sometimes associated swelling and redness. Symptoms can ease a little, but she feels as if the pain is constant.  She took early retirement in 2001 because of the pain.  Pain is worse with weight bearing.  She was being treated for gout and reports that the medication helps some, but she is very concerned that there is nerve problem because the pain is constant and never improves.  Later she also began noting burning and tingling sensation between the first web space.  She does not have similar symptoms on the right foot.  No numbness or tingling.   She endorses muscle cramps at night.  She moved from [place] in 2015.  She was given gabapentin 300mg  by her PCP which may have provided some relief, but again, nothing much.   She has seen podiatry when living in [place] several years ago.   Out-side paper records, electronic medical record, and images have been reviewed where available and summarized as:  Lab Results  Component Value Date   HGBA1C 6.0* 05/04/2014    Past Medical History  Diagnosis Date  . Hypertension     a. Dx @ age 69;  b. 04/2014  admission for HTN emergency.  . Cardiomyopathy ([place])     a. 04/2014 Echo: EF 25-30%, mod conc LVH, possibl antsept HK, Gr1 DD, mod-sev dil LA.  Marland Kitchen Aortic dilatation ([place])     a. 04/2014 CTA chest w/ incidental finding of distal thoracic Ao enlargement of 3.39 mm - f/u needed 04/2015.  Marland Kitchen Gout   . Obesity   . CHF (congestive heart failure) ([place])   . Obesity (BMI 30-39.9) 01/21/2015  . 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 Lovena Le  . Cancer of left breast ([place]) 2009    s/p L mastectomy and chemo.  Marland Kitchen Heart murmur   . OSA on CPAP   . GERD (gastroesophageal reflux disease)   . History of stomach ulcers   . Stroke [facility])     a. 3 strokes - last in 1998; "memory problems since"  (03/26/2015)  . Arthritis     "all over my body"  . Depression   . CKD (chronic kidney disease), stage III     Past Surgical History  Procedure Laterality Date  . Left heart catheterization with coronary angiogram N/A 05/07/2014    Procedure: LEFT HEART CATHETERIZATION WITH CORONARY ANGIOGRAM;  Surgeon: Belva Crome, MD;  Location: [facility] CATH LAB;  Service: Cardiovascular;  Laterality: N/A;  . Ep implantable device N/A 03/26/2015    MDT single chamber ICD, Dr. Lovena Le  . Breast biopsy Left 2008  . Mastectomy Left 2009  . Dilation and  curettage of uterus    . Abdominal hysterectomy    . Cataract extraction, bilateral Bilateral      Medications:  Outpatient Encounter Prescriptions as of 05/07/2015  Medication Sig  . allopurinol (ZYLOPRIM) 100 MG tablet Take 100 mg by mouth daily.  Marland Kitchen amLODipine (NORVASC) 5 MG tablet Take 5 mg by mouth daily.  Marland Kitchen aspirin EC 81 MG tablet Take 1 tablet (81 mg total) by mouth daily.  Marland Kitchen atorvastatin (LIPITOR) 40 MG tablet Take 1 tablet (40 mg total) by mouth daily.  . carvedilol (COREG) 25 MG tablet Take 1 tablet (25 mg total) by mouth 2 (two) times daily.  . colchicine 0.6 MG tablet Take 1 tablet (0.6 mg total) by mouth daily as needed. (Patient taking  differently: Take 0.6 mg by mouth daily as needed (gout). )  . furosemide (LASIX) 20 MG tablet Take 1 tablet (20 mg total) by mouth as needed for fluid (Weight gain of more than 5 pounds.).  Marland Kitchen gabapentin (NEURONTIN) 300 MG capsule Take 300 mg by mouth 3 (three) times daily.  . hydrALAZINE (APRESOLINE) 50 MG tablet Take 1.5 tablets (75 mg total) by mouth 3 (three) times daily.  . isosorbide mononitrate (IMDUR) 60 MG 24 hr tablet Take 1.5 tablets (90 mg total) by mouth daily.  Marland Kitchen losartan (COZAAR) 25 MG tablet Take 1 tablet (25 mg total) by mouth daily.  Marland Kitchen omeprazole (PRILOSEC) 40 MG capsule Take 40 mg by mouth daily.  Marland Kitchen spironolactone (ALDACTONE) 25 MG tablet Take 1 tablet (25 mg total) by mouth daily.  . traMADol (ULTRAM) 50 MG tablet Take 1 tablet (50 mg total) by mouth as needed (twice daily as needed for pain).   No facility-administered encounter medications on file as of 05/07/2015.     Allergies:  Allergies  Allergen Reactions  . Shellfish Allergy Hives    Family History: Family History  Problem Relation Age of Onset  . High blood pressure Mother     Died @ 32.  . High blood pressure Father   . Heart attack Father     Died in his early 25's.  . High blood pressure Sister     Social History: Social History  Substance Use Topics  . Smoking status: Former Smoker -- 0.50 packs/day for 40 years    Types: Cigarettes  . Smokeless tobacco: Never Used     Comment: Quit in 2009.  Marland Kitchen Alcohol Use: 0.0 oz/week    0 Standard drinks or equivalent per week     Comment: "drank occasionally when I was young; last drink was in the late 1990s"   Social History   Social History Narrative   Lives in [place].  Son and Sister nearby.  Moved here from [place], [place] in Fall of 2015.  Does not routinely exericse.    Review of Systems:  CONSTITUTIONAL: No fevers, chills, night sweats, or weight loss.   EYES: No visual changes or eye pain ENT: No hearing changes.  No history of nose bleeds.     RESPIRATORY: No cough, wheezing and shortness of breath.   CARDIOVASCULAR: Negative for chest pain, and palpitations.   GI: Negative for abdominal discomfort, blood in stools or black stools.  No recent change in bowel habits.   GU:  No history of incontinence.   MUSCLOSKELETAL: +history of joint pain or swelling.  No myalgias.   SKIN: Negative for lesions, rash, and itching.   HEMATOLOGY/ONCOLOGY: Negative for prolonged bleeding, bruising easily, and swollen nodes.  +history of cancer.  ENDOCRINE: Negative for cold or heat intolerance, polydipsia or goiter.   PSYCH:  No depression or anxiety symptoms.   NEURO: As Above.   Vital Signs:  BP 130/78 mmHg  Pulse 75  Ht 5\' 4"  (1.626 m)  Wt 213 lb 5 oz (96.758 kg)  BMI 36.60 kg/m2  SpO2 97%   General Medical Exam:   General:  Well appearing, comfortable.   Eyes/ENT: see cranial nerve examination.   Neck: No masses appreciated.  Full range of motion without tenderness.  No carotid bruits. Respiratory:  Clear to auscultation, good air entry bilaterally.   Cardiac:  Regular rate and rhythm, no murmur.   Extremities:  Left great toe bunion.  No edema, or skin discoloration.  Skin:  No rashes or lesions.  Neurological Exam: MENTAL STATUS including orientation to time, place, person, recent and remote memory, attention span and concentration, language, and fund of knowledge is normal.  Speech is not dysarthric.  CRANIAL NERVES: II:  No visual field defects.  Unremarkable fundi.   III-IV-VI: Pupils equal round and reactive to light.  Normal conjugate, extra-ocular eye movements in all directions of gaze.  No nystagmus.  No ptosis.   V:  Normal facial sensation.     VII:  Mild flattening of the nasolabial fold on the right. Normal facial symmetry and movements.    VIII:  Normal hearing and vestibular function.   IX-X:  Normal palatal movement.   XI:  Normal shoulder shrug and head rotation.   XII:  Normal tongue strength and range of  motion, no deviation or fasciculation.  MOTOR:  No atrophy, fasciculations or abnormal movements.  No pronator drift.  Tone is normal.    Right Upper Extremity:    Left Upper Extremity:    Deltoid  5/5   Deltoid  5/5   Biceps  5/5   Biceps  5/5   Triceps  5/5   Triceps  5/5   Wrist extensors  5/5   Wrist extensors  5/5   Wrist flexors  5/5   Wrist flexors  5/5   Finger extensors  5/5   Finger extensors  5/5   Finger flexors  5/5   Finger flexors  5/5   Dorsal interossei  5/5   Dorsal interossei  5/5   Abductor pollicis  5/5   Abductor pollicis  5/5   Tone (Ashworth scale)  0  Tone (Ashworth scale)  0   Right Lower Extremity:    Left Lower Extremity:    Hip flexors  5/5   Hip flexors  5/5   Hip extensors  5/5   Hip extensors  5/5   Knee flexors  5/5   Knee flexors  5/5   Knee extensors  5/5   Knee extensors  5/5   Dorsiflexors  5/5   Dorsiflexors  5/5   Plantarflexors  5/5   Plantarflexors  5/5   Toe extensors  5/5   Toe extensors  5/5   Toe flexors  5/5   Toe flexors  5-/5   Tone (Ashworth scale)  0  Tone (Ashworth scale)  0   MSRs:  Right  Left brachioradialis 2+  brachioradialis 2+  biceps 2+  biceps 2+  triceps 2+  triceps 2+  patellar 2+  patellar 2+  ankle jerk 1+  ankle jerk 1+  Hoffman no  Hoffman no  plantar response down  plantar response down   SENSORY:  Normal and symmetric perception of light touch, pinprick, vibration, and proprioception.  Romberg's sign absent.   COORDINATION/GAIT: Normal finger-to- nose-finger.  Intact rapid alternating movements bilaterally. Gait appears antalgic with slight dragging of the left foot.   IMPRESSION/PLAN: Ms. Schara is a 75 year-old female referred for evaluation of left metatarsal pain and paresthesias.  Her presentation and exam does not suggest that symptoms are due to peripheral neuropathy.  Reduced reflexes at the Achilles bilaterally are most likely  age-related findings. NCS/EMG will be performed to better characterize the nature of her pain.  Because she did have some relief with gabapentin, it still raises the possibility of Morton's neuroma especially with her bunion causing mild foot deformity.  If NCS/EMG is unrevealing, I will refer her to podiatry for evaluation of bunion and possible neuroma. She had questions related to gout, but since this is beyond the realm of my expertise, I asked her to discuss this with her PCP.   The duration of this appointment Dunn was 40 minutes of face-to-face time with the patient.  Greater than 50% of this time was spent in counseling, explanation of diagnosis, planning of further management, and coordination of care.   Thank you for allowing me to participate in patient's care.  If I can answer any additional questions, I would be pleased to do so.    Sincerely,    Brodan Grewell K. Posey Pronto, DO

## 2015-05-07 NOTE — Progress Notes (Signed)
Note routed

## 2015-05-07 NOTE — Patient Instructions (Signed)
1.  NCS/EMG of the left > right lower extremities 2.  We will call you with the results and let you know if you need to see podiatry based on the results

## 2015-05-16 ENCOUNTER — Other Ambulatory Visit (HOSPITAL_COMMUNITY): Payer: Self-pay | Admitting: *Deleted

## 2015-05-16 MED ORDER — LOSARTAN POTASSIUM 25 MG PO TABS: 25.0000 mg | ORAL_TABLET | Freq: Every day | ORAL | Status: DC

## 2015-05-16 MED ORDER — CARVEDILOL 25 MG PO TABS: 25.0000 mg | ORAL_TABLET | Freq: Two times a day (BID) | ORAL | Status: DC

## 2015-05-16 MED ORDER — SPIRONOLACTONE 25 MG PO TABS: 25.0000 mg | ORAL_TABLET | Freq: Every day | ORAL | Status: DC

## 2015-05-20 ENCOUNTER — Ambulatory Visit (INDEPENDENT_AMBULATORY_CARE_PROVIDER_SITE_OTHER): Payer: PPO | Admitting: Neurology

## 2015-05-20 DIAGNOSIS — M21612 Bunion of left foot: Secondary | ICD-10-CM | POA: Diagnosis not present

## 2015-05-20 DIAGNOSIS — M79672 Pain in left foot: Secondary | ICD-10-CM

## 2015-05-20 DIAGNOSIS — R202 Paresthesia of skin: Secondary | ICD-10-CM

## 2015-05-20 NOTE — Procedures (Signed)
[facility]  [place], [place]  [place],  [address] Tel: [phone] Fax:  [phone] Test Date:  05/20/2015  Patient: Tara Dunn DOB: 1940-03-01 Physician: Narda Amber, DO  Sex: Female Height: 5\' 3"  Ref Phys: Narda Amber, DO  ID#: [id number] Temp: 33.4C Technician: Jerilynn Mages. Dean   Patient Complaints: This is a 75 year old female referred for evaluation of left distal foot paresthesias and pain.  NCV & EMG Findings: Extensive electrodiagnostic testing of the left lower extremity and additional studies of the right shows: 1. Bilateral superficial peroneal sensory responses are within normal limits. Absent sural sensory responses can be a normal and age-appropriate findings patients in this age range. 2. Left peroneal and tibial motor responses are within normal limits. 3. Bilateral tibial H reflex studies are within normal limits. 4. There is no evidence of active or chronic motor axon loss changes affecting any of the tested muscles. Motor unit configuration and recruitment pattern is within normal limits.  Impression: This is essentially a normal study of the left lower extremity.   In isolation, absent sural sensory responses bilaterally are most likely age-appropriate findings and does not suggest a sensory polyneuropathy.   ___________________________ Narda Amber, DO    Nerve Conduction Studies Anti Sensory Summary Table   Site NR Peak (ms) Norm Peak (ms) P-T Amp (V) Norm P-T Amp  Left Sup Peroneal Anti Sensory (Ant Lat Mall)  12 cm    3.6 <4.6 3.8 >3  Right Sup Peroneal Anti Sensory (Ant Lat Mall)  12 cm    3.8 <4.6 4.7 >3  Left Sural Anti Sensory (Lat Mall)  Calf NR  <4.6  >3  Right Sural Anti Sensory (Lat Mall)  Calf NR  <4.6  >3   Motor Summary Table   Site NR Onset (ms) Norm Onset (ms) O-P Amp (mV) Norm O-P Amp Site1 Site2 Delta-0 (ms) Dist (cm) Vel (m/s) Norm Vel (m/s)  Left Peroneal Motor (Ext Dig Brev)  Ankle    3.9 <6.0  4.4 >2.5 B Fib Ankle 6.9 34.0 49 >40  B Fib    10.8  4.1  Poplt B Fib 1.7 10.0 59 >40  Poplt    12.5  3.8         Left Tibial Motor (Abd Hall Brev)  Ankle    4.5 <6.0 5.1 >4 Knee Ankle 7.5 37.0 49 >40  Knee    12.0  4.1          H Reflex Studies   NR H-Lat (ms) Lat Norm (ms) L-R H-Lat (ms)  Left Tibial (Gastroc)     29.80 <35 1.90  Right Tibial (Gastroc)     31.70 <35 1.90   EMG   Side Muscle Ins Act Fibs Psw Fasc Number Recrt Dur Dur. Amp Amp. Poly Poly. Comment  Left AntTibialis Nml Nml Nml Nml Nml Nml Nml Nml Nml Nml Nml Nml N/A  Left Gastroc Nml Nml Nml Nml Nml Nml Nml Nml Nml Nml Nml Nml N/A  Left Flex Dig Long Nml Nml Nml Nml Nml Nml Nml Nml Nml Nml Nml Nml N/A  Left RectFemoris Nml Nml Nml Nml Nml Nml Nml Nml Nml Nml Nml Nml N/A  Left GluteusMed Nml Nml Nml Nml Nml Nml Nml Nml Nml Nml Nml Nml N/A  Left BicepsFemS Nml Nml Nml Nml Nml Nml Nml Nml Nml Nml Nml Nml N/A      Waveforms:

## 2015-05-22 ENCOUNTER — Telehealth: Payer: Self-pay | Admitting: Neurology

## 2015-05-22 NOTE — Telephone Encounter (Signed)
PT returned your call/Dawn CB# [phone]

## 2015-05-27 ENCOUNTER — Ambulatory Visit (INDEPENDENT_AMBULATORY_CARE_PROVIDER_SITE_OTHER): Payer: PPO

## 2015-05-27 ENCOUNTER — Encounter: Payer: Self-pay | Admitting: Sports Medicine

## 2015-05-27 ENCOUNTER — Other Ambulatory Visit: Payer: Self-pay | Admitting: Sports Medicine

## 2015-05-27 ENCOUNTER — Ambulatory Visit (INDEPENDENT_AMBULATORY_CARE_PROVIDER_SITE_OTHER): Payer: PPO | Admitting: Sports Medicine

## 2015-05-27 VITALS — BP 99/63 | HR 64 | Resp 12

## 2015-05-27 DIAGNOSIS — R52 Pain, unspecified: Secondary | ICD-10-CM

## 2015-05-27 DIAGNOSIS — M21619 Bunion of unspecified foot: Secondary | ICD-10-CM

## 2015-05-27 DIAGNOSIS — M779 Enthesopathy, unspecified: Secondary | ICD-10-CM

## 2015-05-27 DIAGNOSIS — M2040 Other hammer toe(s) (acquired), unspecified foot: Secondary | ICD-10-CM

## 2015-05-27 MED ORDER — TRIAMCINOLONE ACETONIDE 10 MG/ML IJ SUSP: 10.0000 mg | Freq: Once | INTRAMUSCULAR | Status: DC

## 2015-05-27 NOTE — Progress Notes (Signed)
Patient ID: Tara Dunn, female   DOB: October 24, 1940, 75 y.o.   MRN: [id number] Subjective: Tara Dunn is a 75 y.o. female patient who presents to office for evaluation of left foot pain. Patient complains of progressive pain especially over the last 20 years in the left foot at the big and 2nd toe joints. Pain is now interferring with daily activities. Patient has tried anti-inflammatories with no relief in symptoms. Patient denies any other pedal complaints. Denies injury/trip/fall/sprain/any causative factors.   Patient Active Problem List   Diagnosis Date Noted  . ICD (implantable cardioverter-defibrillator) in place 03/26/2015  . Syncope 03/06/2015  . Obesity (BMI 30-39.9) 01/21/2015  . Excessive daytime sleepiness 08/04/2014  . OSA (obstructive sleep apnea) 08/04/2014  . Essential hypertension 06/13/2014  . Chronic systolic CHF (congestive heart failure) ([place]) 05/20/2014  . Acute renal failure superimposed on stage 3 chronic kidney disease ([place]) 05/06/2014  . Congestive heart disease ([place])   . Acute on chronic combined systolic and diastolic heart failure ([place]) 05/03/2014  . CKD (chronic kidney disease), stage III 05/03/2014  . Hx of stroke without residual deficits 05/03/2014  . Hypertensive emergency   . Hypertensive urgency 05/01/2014    Current Outpatient Prescriptions on File Prior to Visit  Medication Sig Dispense Refill  . allopurinol (ZYLOPRIM) 100 MG tablet Take 100 mg by mouth daily.  0  . amLODipine (NORVASC) 5 MG tablet Take 5 mg by mouth daily.  3  . aspirin EC 81 MG tablet Take 1 tablet (81 mg total) by mouth daily. 90 tablet 3  . atorvastatin (LIPITOR) 40 MG tablet Take 1 tablet (40 mg total) by mouth daily. 90 tablet 3  . carvedilol (COREG) 25 MG tablet Take 1 tablet (25 mg total) by mouth 2 (two) times daily. 180 tablet 3  . colchicine 0.6 MG tablet Take 1 tablet (0.6 mg total) by mouth daily as needed. (Patient taking differently: Take 0.6 mg by mouth daily  as needed (gout). ) 30 tablet 3  . furosemide (LASIX) 20 MG tablet Take 1 tablet (20 mg total) by mouth as needed for fluid (Weight gain of more than 5 pounds.). 30 tablet 0  . gabapentin (NEURONTIN) 300 MG capsule Take 300 mg by mouth 3 (three) times daily.    . hydrALAZINE (APRESOLINE) 50 MG tablet Take 1.5 tablets (75 mg total) by mouth 3 (three) times daily. 270 tablet 3  . isosorbide mononitrate (IMDUR) 60 MG 24 hr tablet Take 1.5 tablets (90 mg total) by mouth daily. 270 tablet 3  . losartan (COZAAR) 25 MG tablet Take 1 tablet (25 mg total) by mouth daily. 90 tablet 3  . omeprazole (PRILOSEC) 40 MG capsule Take 40 mg by mouth daily.  0  . spironolactone (ALDACTONE) 25 MG tablet Take 1 tablet (25 mg total) by mouth daily. 90 tablet 3  . traMADol (ULTRAM) 50 MG tablet Take 1 tablet (50 mg total) by mouth as needed (twice daily as needed for pain). 15 tablet 0   No current facility-administered medications on file prior to visit.    Allergies  Allergen Reactions  . Shellfish Allergy Hives    Objective:  General: Alert and oriented x3 in no acute distress  Dermatology: No open lesions bilateral lower extremities, no webspace macerations, no ecchymosis bilateral, all nails x 10 are well manicured.  Vascular: Dorsalis Pedis and Posterior Tibial pedal pulses palpable, Capillary Fill Time 3 seconds,(+) scant pedal hair growth bilateral, no edema bilateral lower extremities, Temperature gradient within normal limits.  Neurology: Tara Dunn sensation intact via light touch bilateral, (- )Tinels sign bilateral.   Musculoskeletal: Mild tenderness with palpation at 1st and 2nd MTPJ at left foot,No pain with calf compression bilateral. There is decreased ankle rom with knee extending  vs flexed resembling gastroc equnius bilateral, Subtalar joint range of motion is within normal limits, there is no 1st ray hypermobility noted bilateral, decreased 1st MPJ rom Left>right with functional limitus and  bunion and hammertoe noted on weightbearing exam.Strength within normal limits in all groups bilateral.   Xrays  Left Foot   Impression:Normal osseous mineralization, significant bunion grade 4 and hammertoe deformity, midfoot breach and arthritis, mild thickening of plantar fascia, no fracture/dislocation, soft tissues within normal limits.   Assessment and Plan: Problem List Items Addressed This Visit    None    Visit Diagnoses    Pain    -  Primary    Bunion        Hammer toe, unspecified laterality        Capsulitis        Relevant Medications    triamcinolone acetonide (KENALOG) 10 MG/ML injection 10 mg (Start on 05/27/2015  9:30 PM)       -Complete examination performed -Xrays reviewed -Discussed treatement options -After oral consent and aseptic prep, injected a mixture containing 1 ml of 2%  plain lidocaine, 1 ml 0.5% plain marcaine, 0.5 ml of kenalog 10 and 0.5 ml of dexamethasone phosphate into Left 1st and 2nd MTPJ without complication. Post-injection care discussed with patient.  -Gave met pads and instructed on use -Recommend good supportive shoes daily for foot type -Cont with walker assisted gait -Patient to return to office in 4-6 weeks/ as needed or sooner if condition worsens.  Landis Martins, DPM

## 2015-05-27 NOTE — Progress Notes (Deleted)
   Subjective:    Patient ID: Tara Dunn, female    DOB: March 03, 1940, 75 y.o.   MRN: [id number]  HPI    Review of Systems  Musculoskeletal: Positive for back pain and gait problem.       Objective:   Physical Exam        Assessment & Plan:

## 2015-06-06 ENCOUNTER — Ambulatory Visit (HOSPITAL_COMMUNITY)
Admission: RE | Admit: 2015-06-06 | Discharge: 2015-06-06 | Disposition: A | Discharge status: Home / Self Care | Payer: PPO | Source: Ambulatory Visit | Attending: Cardiology | Admitting: Cardiology

## 2015-06-06 ENCOUNTER — Encounter (HOSPITAL_COMMUNITY): Payer: Self-pay

## 2015-06-06 VITALS — BP 137/75 | HR 71 | Ht 64.0 in | Wt 215.8 lb

## 2015-06-06 DIAGNOSIS — M109 Gout, unspecified: Secondary | ICD-10-CM | POA: Diagnosis not present

## 2015-06-06 DIAGNOSIS — M545 Low back pain: Secondary | ICD-10-CM | POA: Diagnosis not present

## 2015-06-06 DIAGNOSIS — Z9221 Personal history of antineoplastic chemotherapy: Secondary | ICD-10-CM | POA: Insufficient documentation

## 2015-06-06 DIAGNOSIS — I5022 Chronic systolic (congestive) heart failure: Secondary | ICD-10-CM | POA: Diagnosis not present

## 2015-06-06 DIAGNOSIS — I2510 Atherosclerotic heart disease of native coronary artery without angina pectoris: Secondary | ICD-10-CM | POA: Insufficient documentation

## 2015-06-06 DIAGNOSIS — I130 Hypertensive heart and chronic kidney disease with heart failure and stage 1 through stage 4 chronic kidney disease, or unspecified chronic kidney disease: Secondary | ICD-10-CM | POA: Insufficient documentation

## 2015-06-06 DIAGNOSIS — Z95810 Presence of automatic (implantable) cardiac defibrillator: Secondary | ICD-10-CM | POA: Diagnosis not present

## 2015-06-06 DIAGNOSIS — Z9012 Acquired absence of left breast and nipple: Secondary | ICD-10-CM | POA: Diagnosis not present

## 2015-06-06 DIAGNOSIS — E669 Obesity, unspecified: Secondary | ICD-10-CM | POA: Insufficient documentation

## 2015-06-06 DIAGNOSIS — I10 Essential (primary) hypertension: Secondary | ICD-10-CM

## 2015-06-06 DIAGNOSIS — I429 Cardiomyopathy, unspecified: Secondary | ICD-10-CM | POA: Insufficient documentation

## 2015-06-06 DIAGNOSIS — Z853 Personal history of malignant neoplasm of breast: Secondary | ICD-10-CM | POA: Insufficient documentation

## 2015-06-06 DIAGNOSIS — Z6837 Body mass index (BMI) 37.0-37.9, adult: Secondary | ICD-10-CM | POA: Diagnosis not present

## 2015-06-06 DIAGNOSIS — Z79899 Other long term (current) drug therapy: Secondary | ICD-10-CM | POA: Insufficient documentation

## 2015-06-06 DIAGNOSIS — Z8673 Personal history of transient ischemic attack (TIA), and cerebral infarction without residual deficits: Secondary | ICD-10-CM | POA: Insufficient documentation

## 2015-06-06 DIAGNOSIS — Z8249 Family history of ischemic heart disease and other diseases of the circulatory system: Secondary | ICD-10-CM | POA: Diagnosis not present

## 2015-06-06 DIAGNOSIS — Z7982 Long term (current) use of aspirin: Secondary | ICD-10-CM | POA: Diagnosis not present

## 2015-06-06 DIAGNOSIS — N183 Chronic kidney disease, stage 3 unspecified: Secondary | ICD-10-CM

## 2015-06-06 DIAGNOSIS — G4733 Obstructive sleep apnea (adult) (pediatric): Secondary | ICD-10-CM | POA: Insufficient documentation

## 2015-06-06 DIAGNOSIS — Z87891 Personal history of nicotine dependence: Secondary | ICD-10-CM | POA: Diagnosis not present

## 2015-06-06 DIAGNOSIS — N189 Chronic kidney disease, unspecified: Secondary | ICD-10-CM | POA: Diagnosis not present

## 2015-06-06 LAB — BASIC METABOLIC PANEL
ANION GAP: 10 (ref 5–15)
BUN: 37 mg/dL — ABNORMAL HIGH (ref 6–20)
CHLORIDE: 105 mmol/L (ref 101–111)
CO2: 22 mmol/L (ref 22–32)
CREATININE: 1.98 mg/dL — AB (ref 0.44–1.00)
Calcium: 10.2 mg/dL (ref 8.9–10.3)
GFR calc non Af Amer: 24 mL/min — ABNORMAL LOW (ref >60)
GFR, EST AFRICAN AMERICAN: 27 mL/min — AB (ref >60)
Glucose, Bld: 107 mg/dL — ABNORMAL HIGH (ref 65–99)
POTASSIUM: 4.5 mmol/L (ref 3.5–5.1)
SODIUM: 137 mmol/L (ref 135–145)

## 2015-06-06 NOTE — Patient Instructions (Signed)
Lab today  You have been referred to Cardiac Rehab  Your physician recommends that you schedule a follow-up appointment in: 6-8 weeks

## 2015-06-06 NOTE — Progress Notes (Signed)
Advanced Heart Failure Medication Review by a Pharmacist  Does the patient  feel that his/her medications are working for him/her?  yes  Has the patient been experiencing any side effects to the medications prescribed?  no  Does the patient measure his/her own blood pressure or blood glucose at home?  no   Does the patient have any problems obtaining medications due to transportation or finances?   no  Understanding of regimen: good Understanding of indications: good Potential of compliance: good Patient understands to avoid NSAIDs. Patient understands to avoid decongestants.  Issues to address at subsequent visits: None   Pharmacist comments:  Tara Dunn is a pleasant 75 yo F presenting without a medication list but with good recall of her regimen. She reports good compliance with her regimen and states that she needed to take a dose of lasix last week for 3lb weight gain in 24 hours. She did not have any other medication-related questions or concerns for me at this time.   Tara Dunn. Tara Dunn, PharmD, BCPS, CPP Clinical Pharmacist Pager: [phone] Phone: [phone] 06/06/2015 10:35 AM      Time with patient: 8 minutes Preparation and documentation time: 2 minutes Total time: 10 minutes

## 2015-06-06 NOTE — Progress Notes (Signed)
Patient ID: Tara Dunn, female   DOB: January 05, 1941, 75 y.o.   MRN: [id number]   PCP: Dr. Ernie Hew HF MD: Dr Haroldine Laws  75 yo with long history of HTN, CVAs, CKD, and systolic CHF.  She moved to [place] from [place] in 2015.   In 3/16, she was admitted a hypertensive emergency and acute CHF.  CTA chest showed no PE.  Echo showed EF 25-30% with moderate LVH.  She was diuresed and had a cardiac catheterization given extensive coronary calcification seen on CTA chest.  Cath showed diffuse moderate CAD, worst was 50-70% mRCA stenosis.   Cardiac MRI in 12/16 showed EF 32%.  ICD was placed in 2/17.    Today she returns for HF follow up. Last visit lisinopril stopped and losartan was added due to cough. She says her cough has gone. Overall feeling ok. Mild dyspnea with exertion but this her baseline. Sleeps on 2 pillows. Weight at home 210 pounds. Takes lasix about once a month. Uses CPAP most nights.  Ambulates with a cane. Taking all medications. Wants to go to [place] for a month to take care of family issues.  .  Labs (3/16): K 4.2, creatinine 1.44, HCT 39.7 Labs (06/05/14): K 4.1, creatinine 2.01 Labs (5/16): K 4.7, creatinine 1.56 Labs (7/16): K 4.7, creatinine 1.68, BNP 14, LDL 62 Labs (11/16): K 4.2, creatinine 1.93 Labs (1/17): K 4.6, creatinine 1.65, HCT 39.2 Labs 04/25/2015: K 4.9 Creatinine 1.67   PMH: 1. CVA: 3 prior CVAs, last in 1998.  Thought to be related to HTN. 2. HTN: Long-standing, says she has been hypertensive since age 79. Renal artery dopplers (4/16) were normal.  3. CKD: Suspect related to HTN. 4. Breast cancer: 2009, left mastectomy with chemotherapy.  5. Gout 6. Obese 7. TAH 8. CAD: LHC (3/16) with moderate diffuse disease, worst lesion being 50-70% mid RCA.  9. Cardiomyopathy: Echo (3/16) with EF 25-30%, moderate LVH.  Echo (6/16) with EF 35-40%, moderate LVH, diffuse hypokinesis with septal-lateral dyssynchrony.  Cardiac MRI (12/16) with EF 32%, diffuse  hypokinesis, no contrast given due to CKD.  - Medtronic ICD 2/17.  10. OSA: Using CPAP.  11. Event monitor (1/17) with NSR, occasional PVCs.   SH: From [place], moved to [place] in 2015 to be near daughter.  Single.  Quit smoking 2009.    FH: HTN runs in family.  Father with MI.   ROS: All systems reviewed and negative except as per HPI.   Current Outpatient Prescriptions  Medication Sig Dispense Refill  . allopurinol (ZYLOPRIM) 100 MG tablet Take 100 mg by mouth daily.  0  . amLODipine (NORVASC) 5 MG tablet Take 5 mg by mouth daily.  3  . aspirin EC 81 MG tablet Take 1 tablet (81 mg total) by mouth daily. 90 tablet 3  . atorvastatin (LIPITOR) 40 MG tablet Take 1 tablet (40 mg total) by mouth daily. 90 tablet 3  . carvedilol (COREG) 25 MG tablet Take 1 tablet (25 mg total) by mouth 2 (two) times daily. 180 tablet 3  . colchicine 0.6 MG tablet Take 1 tablet (0.6 mg total) by mouth daily as needed. (Patient taking differently: Take 0.6 mg by mouth daily as needed (gout). ) 30 tablet 3  . furosemide (LASIX) 20 MG tablet Take 1 tablet (20 mg total) by mouth as needed for fluid (Weight gain of more than 5 pounds.). 30 tablet 0  . gabapentin (NEURONTIN) 300 MG capsule Take 300 mg by mouth 3 (three) times  daily.    . hydrALAZINE (APRESOLINE) 50 MG tablet Take 1.5 tablets (75 mg total) by mouth 3 (three) times daily. 270 tablet 3  . isosorbide mononitrate (IMDUR) 60 MG 24 hr tablet Take 1.5 tablets (90 mg total) by mouth daily. 270 tablet 3  . losartan (COZAAR) 25 MG tablet Take 1 tablet (25 mg total) by mouth daily. 90 tablet 3  . omeprazole (PRILOSEC) 40 MG capsule Take 40 mg by mouth daily.  0  . spironolactone (ALDACTONE) 25 MG tablet Take 1 tablet (25 mg total) by mouth daily. 90 tablet 3  . traMADol (ULTRAM) 50 MG tablet Take 1 tablet (50 mg total) by mouth as needed (twice daily as needed for pain). 15 tablet 0   Current Facility-Administered Medications  Medication Dose Route Frequency Provider  Last Rate Last Dose  . triamcinolone acetonide (KENALOG) 10 MG/ML injection 10 mg  10 mg Other Once [employer], DPM       BP 137/75 mmHg  Pulse 71  Ht 5\' 4"  (1.626 m)  Wt 215 lb 12.8 oz (97.886 kg)  BMI 37.02 kg/m2  SpO2 98% General: NAD Neck: No JVD, no thyromegaly or thyroid nodule.  Lungs: Clear to auscultation bilaterally with normal respiratory effort. CV: Nondisplaced PMI.  Heart regular S1/S2, +S4, no murmur.  No edema.  No carotid bruit.  Normal pedal pulses.  Abdomen: Soft, nontender, no hepatosplenomegaly, no distention.  Skin: Intact without lesions or rashes.  Neurologic: Alert and oriented x 3.  Psych: Normal affect. Extremities: No clubbing or cyanosis.  HEENT: Normal.   Assessment/Plan: 1. HTN: Long-standing, severe, poorly controlled over time but better now.  Has had history of CVA and CKD.  Renal artery dopplers were normal. BP still high, she stopped lisinopril due to cough.   Continue losartan  25 mg daily today.  2. CAD: Moderate diffuse CAD, no severe obstruction.  This did not cause her cardiomyopathy.  No chest pain.  - Continue ASA 81 - Continue atorvastatin, good lipids 7/16. 3. CKD: Check BMET today.  4. 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  She does not look volume overloaded on exam. NYHA class II-III probably, more limited by low back pain and gout currently than CHF.  EF has been persistently low, 32% by cardiac MRI in 12/16.  Unable to give contrast to assess for scar/infiltrative disease due to CKD.  She had Medtronic ICD placed in 2/17. Fluid threshold flat. Activity less than 1 hour per day.  Refer to cardiac rehab.  - She has had Lasix for prn use, takes once a month.   - Continue current Coreg, hydralazine/Imdur and spironolactone.   - Continue losartan 25 mg daily. Check BMET.  -Hold on switching to entresto. Concerned she may get hypovolemic. Consider at next visit.  5. Low back pain: Avoid NSAIDs.  She has  been taking aleve. Today I asked her to stop all together with CKD.  Today I asked her to make sure and take all medications if she travels to [place].   Follow up in 6-8 weeks.    Jurline Folger NP-C  06/06/2015

## 2015-06-18 DIAGNOSIS — M19042 Primary osteoarthritis, left hand: Secondary | ICD-10-CM | POA: Diagnosis not present

## 2015-06-18 DIAGNOSIS — M19041 Primary osteoarthritis, right hand: Secondary | ICD-10-CM | POA: Diagnosis not present

## 2015-06-23 ENCOUNTER — Encounter: Payer: Self-pay | Admitting: Internal Medicine

## 2015-06-24 ENCOUNTER — Encounter: Payer: Self-pay | Admitting: Sports Medicine

## 2015-06-24 ENCOUNTER — Ambulatory Visit (INDEPENDENT_AMBULATORY_CARE_PROVIDER_SITE_OTHER): Payer: PPO | Admitting: Sports Medicine

## 2015-06-24 ENCOUNTER — Encounter: Payer: Self-pay | Admitting: Internal Medicine

## 2015-06-24 DIAGNOSIS — M79672 Pain in left foot: Secondary | ICD-10-CM | POA: Diagnosis not present

## 2015-06-24 DIAGNOSIS — M779 Enthesopathy, unspecified: Secondary | ICD-10-CM | POA: Diagnosis not present

## 2015-06-24 DIAGNOSIS — M2040 Other hammer toe(s) (acquired), unspecified foot: Secondary | ICD-10-CM

## 2015-06-24 DIAGNOSIS — M21619 Bunion of unspecified foot: Secondary | ICD-10-CM

## 2015-06-24 NOTE — Progress Notes (Signed)
Patient ID: Dajanai Millis, female   DOB: 11-17-1940, 75 y.o.   MRN: [id number] Subjective: Elleny Hird is a 75 y.o. female patient who returns to office for evaluation of left foot pain. Patient states that the injection at the bunion and 2nd toe on left lasted about 2 hours before her symptoms started to come back. States that she did not use pads because she left them at a friends house. Patient states that she wants her toes fixed. She is tired of dealing with the pain. States that she has constant pain over the toes and burning. Patient denies any other pedal complaints.  Patient Active Problem List   Diagnosis Date Noted  . ICD (implantable cardioverter-defibrillator) in place 03/26/2015  . Syncope 03/06/2015  . Obesity (BMI 30-39.9) 01/21/2015  . Excessive daytime sleepiness 08/04/2014  . OSA (obstructive sleep apnea) 08/04/2014  . Essential hypertension 06/13/2014  . Chronic systolic CHF (congestive heart failure) ([place]) 05/20/2014  . Acute renal failure superimposed on stage 3 chronic kidney disease ([place]) 05/06/2014  . Congestive heart disease ([place])   . Acute on chronic combined systolic and diastolic heart failure ([place]) 05/03/2014  . CKD (chronic kidney disease), stage III 05/03/2014  . Hx of stroke without residual deficits 05/03/2014  . Hypertensive emergency   . Hypertensive urgency 05/01/2014    Current Outpatient Prescriptions on File Prior to Visit  Medication Sig Dispense Refill  . allopurinol (ZYLOPRIM) 100 MG tablet Take 100 mg by mouth daily.  0  . amLODipine (NORVASC) 5 MG tablet Take 5 mg by mouth daily.  3  . aspirin EC 81 MG tablet Take 1 tablet (81 mg total) by mouth daily. 90 tablet 3  . atorvastatin (LIPITOR) 40 MG tablet Take 1 tablet (40 mg total) by mouth daily. 90 tablet 3  . carvedilol (COREG) 25 MG tablet Take 1 tablet (25 mg total) by mouth 2 (two) times daily. 180 tablet 3  . colchicine 0.6 MG tablet Take 0.6 mg by mouth daily as needed (gouty  flare).    . furosemide (LASIX) 20 MG tablet Take 1 tablet (20 mg total) by mouth as needed for fluid (Weight gain of more than 5 pounds.). 30 tablet 0  . gabapentin (NEURONTIN) 300 MG capsule Take 300 mg by mouth 3 (three) times daily.    . hydrALAZINE (APRESOLINE) 50 MG tablet Take 1.5 tablets (75 mg total) by mouth 3 (three) times daily. 270 tablet 3  . isosorbide mononitrate (IMDUR) 60 MG 24 hr tablet Take 1.5 tablets (90 mg total) by mouth daily. 270 tablet 3  . losartan (COZAAR) 25 MG tablet Take 1 tablet (25 mg total) by mouth daily. 90 tablet 3  . naproxen sodium (ANAPROX) 220 MG tablet Take 220 mg by mouth 2 (two) times daily as needed (back pain).    Marland Kitchen omeprazole (PRILOSEC) 40 MG capsule Take 40 mg by mouth daily.  0  . spironolactone (ALDACTONE) 25 MG tablet Take 1 tablet (25 mg total) by mouth daily. 90 tablet 3  . traMADol (ULTRAM) 50 MG tablet Take 1 tablet (50 mg total) by mouth as needed (twice daily as needed for pain). 15 tablet 0   No current facility-administered medications on file prior to visit.    Allergies  Allergen Reactions  . Shellfish Allergy Hives   Past Surgical History  Procedure Laterality Date  . Left heart catheterization with coronary angiogram N/A 05/07/2014    Procedure: LEFT HEART CATHETERIZATION WITH CORONARY ANGIOGRAM;  Surgeon: Belva Crome, MD;  Location: [facility] CATH [facility];  Service: Cardiovascular;  Laterality: N/A;  . Ep implantable device N/A 03/26/2015    MDT single chamber ICD, Dr. Lovena Le  . Breast biopsy Left 2008  . Mastectomy Left 2009  . Dilation and curettage of uterus    . Abdominal hysterectomy    . Cataract extraction, bilateral Bilateral    Family History  Problem Relation Age of Onset  . High blood pressure Mother     Died @ 103.  . High blood pressure Father   . Heart attack Father     Died in his early 50's.  . High blood pressure Sister   . Cancer Sister   . Cancer Daughter   . Heart disease Daughter    Social History    Social History  . Marital Status: Widowed    Spouse Name: N/A  . Number of Children: N/A  . Years of Education: N/A   Occupational History  . Not on file.   Social History Main Topics  . Smoking status: Former Smoker -- 0.50 packs/day for 40 years    Types: Cigarettes  . Smokeless tobacco: Never Used     Comment: Quit in 2009.  Marland Kitchen Alcohol Use: No     Comment: "drank occasionally when I was young; last drink was in the late 1990s"  . Drug Use: No  . Sexual Activity: No   Other Topics Concern  . Not on file   Social History Narrative   Lives alone in an independent living facility.  Education: 10th grade.     Retired from [employer] work.  Moved here from [place] in 2015.   Objective:  General: Alert and oriented x3 in no acute distress  Dermatology: No open lesions bilateral lower extremities, no webspace macerations, no ecchymosis bilateral, all nails x 10 are well manicured.  Vascular: Dorsalis Pedis and Posterior Tibial pedal pulses palpable, Capillary Fill Time 3 seconds,(+) scant pedal hair growth bilateral, no edema bilateral lower extremities, Temperature gradient within normal limits.  Neurology: Johney Maine sensation intact via light touch bilateral, (- )Tinels sign bilateral.   Musculoskeletal: Mild tenderness with palpation at 1st and 2nd MTPJ at left foot,No pain with calf compression bilateral. There is decreased ankle rom with knee extending  vs flexed resembling gastroc equnius bilateral, Subtalar joint range of motion is within normal limits, there is no 1st ray hypermobility noted bilateral, decreased 1st MPJ rom Left>right with functional limitus and bunion and hammertoe noted on weightbearing exam.Strength within normal limits in all groups bilateral.   Assessment and Plan: Problem List Items Addressed This Visit    None    Visit Diagnoses    Bunion    -  Primary    Hammer toe, unspecified laterality        Capsulitis        Foot pain, left           -Complete  examination performed -Discussed treatement options -Patient opt for surgical management. Consent obtained for bunionectomy with 1st metatarsal osteotomy and hammertoe repair with possible flexor tenotomy. Pre and Post op course explained. Risks, benefits, alternatives explained. No guarantees given or implied. Surgical booking slip submitted and provided patient with Surgical packet and info for [facility]. Patient will need PCP and Cardiac clearance prior -Advised patient that she will need assistance during post op period -Dispensed CAM Walker to use post op -Recommend good supportive shoes daily for foot type -Cont with walker assisted gait -Patient to return to office after surgery or  sooner if condition worsens.  Landis Martins, DPM

## 2015-06-24 NOTE — Patient Instructions (Signed)
Pre-Operative Instructions  Congratulations, you have decided to take an important step to improving your quality of life.  You can be assured that the doctors of [facility] will be with you every step of the way.  1. Plan to be at the surgery center/hospital at least 1 (one) hour prior to your scheduled time unless otherwise directed by the surgical center/hospital staff.  You must have a responsible adult accompany you, remain during the surgery and drive you home.  Make sure you have directions to the surgical center/hospital and know how to get there on time. 2. For hospital based surgery you will need to obtain a history and physical form from your family physician within 1 month prior to the date of surgery- we will give you a form for you primary physician.  3. We make every effort to accommodate the date you request for surgery.  There are however, times where surgery dates or times have to be moved.  We will contact you as soon as possible if a change in schedule is required.   4. No Aspirin/Ibuprofen for one week before surgery.  If you are on aspirin, any non-steroidal anti-inflammatory medications (Mobic, Aleve, Ibuprofen) you should stop taking it 7 days prior to your surgery.  You make take Tylenol  For pain prior to surgery.  5. Medications- If you are taking daily heart and blood pressure medications, seizure, reflux, allergy, asthma, anxiety, pain or diabetes medications, make sure the surgery center/hospital is aware before the day of surgery so they may notify you which medications to take or avoid the day of surgery. 6. No food or drink after midnight the night before surgery unless directed otherwise by surgical center/hospital staff. 7. No alcoholic beverages 24 hours prior to surgery.  No smoking 24 hours prior to or 24 hours after surgery. 8. Wear loose pants or shorts- loose enough to fit over bandages, boots, and casts. 9. No slip on shoes, sneakers are best. 10. Bring  your boot with you to the surgery center/hospital.  Also bring crutches or a walker if your physician has prescribed it for you.  If you do not have this equipment, it will be provided for you after surgery. 11. If you have not been contracted by the surgery center/hospital by the day before your surgery, call to confirm the date and time of your surgery. 12. Leave-time from work may vary depending on the type of surgery you have.  Appropriate arrangements should be made prior to surgery with your employer. 13. Prescriptions will be provided immediately following surgery by your doctor.  Have these filled as soon as possible after surgery and take the medication as directed. 14. Remove nail polish on the operative foot. 15. Wash the night before surgery.  The night before surgery wash the foot and leg well with the antibacterial soap provided and water paying special attention to beneath the toenails and in between the toes.  Rinse thoroughly with water and dry well with a towel.  Perform this wash unless told not to do so by your physician.  Enclosed: 1 Ice pack (please put in freezer the night before surgery)   1 Hibiclens skin cleaner   Pre-op Instructions  If you have any questions regarding the instructions, do not hesitate to call our office.  [place]: [address] [place], [place] [address] [phone]  [place]: [address] [place], [place] [address] [phone]  [place]: [address]  [place], [place] [address] [phone]   Dr.   Norman Regal DPM, Dr. Matthew Wagoner DPM, Dr. M. Todd Hyatt DPM, Dr. Marylin Lathon DPM 

## 2015-06-25 ENCOUNTER — Ambulatory Visit (INDEPENDENT_AMBULATORY_CARE_PROVIDER_SITE_OTHER): Payer: PPO | Admitting: Internal Medicine

## 2015-06-25 ENCOUNTER — Encounter: Payer: Self-pay | Admitting: Internal Medicine

## 2015-06-25 VITALS — BP 160/112 | HR 68 | Ht 63.0 in | Wt 214.8 lb

## 2015-06-25 DIAGNOSIS — I110 Hypertensive heart disease with heart failure: Secondary | ICD-10-CM

## 2015-06-25 DIAGNOSIS — I5022 Chronic systolic (congestive) heart failure: Secondary | ICD-10-CM | POA: Diagnosis not present

## 2015-06-25 DIAGNOSIS — Z95810 Presence of automatic (implantable) cardiac defibrillator: Secondary | ICD-10-CM | POA: Diagnosis not present

## 2015-06-25 NOTE — Progress Notes (Signed)
HPI Tara Dunn returns today for followup of her ICD. She is a native of [place] and has moved down to live closer to her daughter. She has a non-ischemic CM, and chronic systolic heart failure, Ef 30% by echo/MRI. The patient has been on maximal medical therapy. She has LVH and IVCD and QRS of 130. No syncope. Her PR is short. She underwent insertion of a single chamber ICD 3 months ago. In the interim, she has done well. No other complaints today except for foot pain due to arthritis and gout which limits her mobility. Allergies  Allergen Reactions  . Shellfish Allergy Hives     Current Outpatient Prescriptions  Medication Sig Dispense Refill  . allopurinol (ZYLOPRIM) 100 MG tablet Take 100 mg by mouth daily.  0  . amLODipine (NORVASC) 5 MG tablet Take 5 mg by mouth daily.  3  . aspirin EC 81 MG tablet Take 1 tablet (81 mg total) by mouth daily. 90 tablet 3  . atorvastatin (LIPITOR) 40 MG tablet Take 1 tablet (40 mg total) by mouth daily. 90 tablet 3  . carvedilol (COREG) 25 MG tablet Take 1 tablet (25 mg total) by mouth 2 (two) times daily. 180 tablet 3  . colchicine 0.6 MG tablet Take 0.6 mg by mouth daily as needed (gouty flare).    . furosemide (LASIX) 20 MG tablet Take 1 tablet (20 mg total) by mouth as needed for fluid (Weight gain of more than 5 pounds.). 30 tablet 0  . gabapentin (NEURONTIN) 300 MG capsule Take 300 mg by mouth 3 (three) times daily.    . hydrALAZINE (APRESOLINE) 50 MG tablet Take 1.5 tablets (75 mg total) by mouth 3 (three) times daily. 270 tablet 3  . isosorbide mononitrate (IMDUR) 60 MG 24 hr tablet Take 1.5 tablets (90 mg total) by mouth daily. 270 tablet 3  . losartan (COZAAR) 25 MG tablet Take 1 tablet (25 mg total) by mouth daily. 90 tablet 3  . naproxen sodium (ANAPROX) 220 MG tablet Take 220 mg by mouth 2 (two) times daily as needed (back pain).    Marland Kitchen omeprazole (PRILOSEC) 40 MG capsule Take 40 mg by mouth daily.  0  . spironolactone (ALDACTONE)  25 MG tablet Take 1 tablet (25 mg total) by mouth daily. 90 tablet 3  . traMADol (ULTRAM) 50 MG tablet Take 1 tablet (50 mg total) by mouth as needed (twice daily as needed for pain). 15 tablet 0   No current facility-administered medications for this visit.     Past Medical History  Diagnosis Date  . Hypertension     a. Dx @ age 38;  b. 04/2014 admission for HTN emergency.  . Cardiomyopathy ([place])     a. 04/2014 Echo: EF 25-30%, mod conc LVH, possibl antsept HK, Gr1 DD, mod-sev dil LA.  Marland Kitchen Aortic dilatation ([place])     a. 04/2014 CTA chest w/ incidental finding of distal thoracic Ao enlargement of 3.39 mm - f/u needed 04/2015.  Marland Kitchen Gout   . Obesity   . CHF (congestive heart failure) ([place])   . Obesity (BMI 30-39.9) 01/21/2015  . 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 Lovena Le  . Cancer of left breast ([place]) 2009    s/p L mastectomy and chemo.  Marland Kitchen Heart murmur   . OSA on CPAP   . GERD (gastroesophageal reflux disease)   . History of stomach ulcers   . Stroke [facility])  a. 3 strokes - last in 1998; "memory problems since"  (03/26/2015)  . Arthritis     "all over my body"  . Depression   . CKD (chronic kidney disease), stage III     ROS:   All systems reviewed and negative except as noted in the HPI.   Past Surgical History  Procedure Laterality Date  . Left heart catheterization with coronary angiogram N/A 05/07/2014    Procedure: LEFT HEART CATHETERIZATION WITH CORONARY ANGIOGRAM;  Surgeon: Belva Crome, MD;  Location: [facility] CATH LAB;  Service: Cardiovascular;  Laterality: N/A;  . Ep implantable device N/A 03/26/2015    MDT single chamber ICD, Dr. Lovena Le  . Breast biopsy Left 2008  . Mastectomy Left 2009  . Dilation and curettage of uterus    . Abdominal hysterectomy    . Cataract extraction, bilateral Bilateral      Family History  Problem Relation Age of Onset  . High blood pressure Mother     Died @ 67.  . High blood pressure Father   .  Heart attack Father     Died in his early 1's.  . High blood pressure Sister   . Cancer Sister   . Cancer Daughter   . Heart disease Daughter      Social History   Social History  . Marital Status: Widowed    Spouse Name: N/A  . Number of Children: N/A  . Years of Education: N/A   Occupational History  . Not on file.   Social History Main Topics  . Smoking status: Former Smoker -- 0.50 packs/day for 40 years    Types: Cigarettes  . Smokeless tobacco: Never Used     Comment: Quit in 2009.  Marland Kitchen Alcohol Use: No     Comment: "drank occasionally when I was young; last drink was in the late 1990s"  . Drug Use: No  . Sexual Activity: No   Other Topics Concern  . Not on file   Social History Narrative   Lives alone in an independent living facility.  Education: 10th grade.     Retired from [employer] work.  Moved here from [place] in 2015.     BP 160/112 mmHg  Pulse 68  Ht 5\' 3"  (1.6 m)  Wt 214 lb 12.8 oz (97.433 kg)  BMI 38.06 kg/m2  Physical Exam:  Well appearing 75 yo woman, NAD HEENT: Unremarkable Neck:  6 cm JVD, no thyromegally Lymphatics:  No adenopathy Back:  No CVA tenderness Lungs:  Clear with no wheezes HEART:  Regular rate rhythm, no murmurs, no rubs, no clicks Abd:  soft, positive bowel sounds, no organomegally, no rebound, no guarding Ext:  2 plus pulses, no edema, no cyanosis, no clubbing Skin:  No rashes no nodules Neuro:  CN II through XII intact, motor grossly intact  EKG - nsr with LVH and IVCD  Assess/Plan: 1. Chronic systolic heart failure - her symptoms are well controlled. She will continue her current meds. 2. HTN - her blood pressure is not well controlled. She has some pain in her legs. She is encouraged to lose weight. She notes that her pressures are usually much better. 3. ICD - her medtronic single chamber MRI compatible device is working normally. Will recheck in several months. 4. Preop eval - the patient is pending bunion surgery. She may  proceed. She is low risk.   Mikle Bosworth.D.

## 2015-06-25 NOTE — Patient Instructions (Signed)
Medication Instructions:  Your physician recommends that you continue on your current medications as directed. Please refer to the Current Medication list given to you today.   Labwork: None ordered   Testing/Procedures: None ordered   Follow-Up: Your physician wants you to follow-up in: 9 months with Dr Knox Saliva will receive a reminder letter in the mail two months in advance. If you don't receive a letter, please call our office to schedule the follow-up appointment.  Remote monitoring is used to monitor your ICD from home. This monitoring reduces the number of office visits required to check your device to one time per year. It allows [place] to keep an eye on the functioning of your device to ensure it is working properly. You are scheduled for a device check from home on 09/24/15. You may send your transmission at any time that day. If you have a wireless device, the transmission will be sent automatically. After your physician reviews your transmission, you will receive a postcard with your next transmission date.     Any Other Special Instructions Will Be Listed Below (If Applicable).     If you need a refill on your cardiac medications before your next appointment, please call your pharmacy.

## 2015-06-27 LAB — CUP PACEART INCLINIC DEVICE CHECK
Battery Remaining Longevity: 137 mo
Brady Statistic RV Percent Paced: 0.03 %
HighPow Impedance: 64 Ohm
Implantable Lead Location: 753860
Lead Channel Impedance Value: 437 Ohm
Lead Channel Pacing Threshold Amplitude: 0.75 V
Lead Channel Setting Pacing Amplitude: 2.5 V
Lead Channel Setting Pacing Pulse Width: 0.4 ms
Lead Channel Setting Sensing Sensitivity: 0.3 mV
MDC IDC LEAD IMPLANT DT: 20170215
MDC IDC MSMT BATTERY VOLTAGE: 3.15 V
MDC IDC MSMT LEADCHNL RV IMPEDANCE VALUE: 380 Ohm
MDC IDC MSMT LEADCHNL RV PACING THRESHOLD PULSEWIDTH: 0.4 ms
MDC IDC MSMT LEADCHNL RV SENSING INTR AMPL: 20.375 mV
MDC IDC MSMT LEADCHNL RV SENSING INTR AMPL: 30.5 mV
MDC IDC SESS DTM: 20170517132620

## 2015-07-15 DIAGNOSIS — I10 Essential (primary) hypertension: Secondary | ICD-10-CM | POA: Diagnosis not present

## 2015-07-15 DIAGNOSIS — E785 Hyperlipidemia, unspecified: Secondary | ICD-10-CM | POA: Diagnosis not present

## 2015-07-15 DIAGNOSIS — M109 Gout, unspecified: Secondary | ICD-10-CM | POA: Diagnosis not present

## 2015-07-17 DIAGNOSIS — M109 Gout, unspecified: Secondary | ICD-10-CM | POA: Diagnosis not present

## 2015-07-17 DIAGNOSIS — M7742 Metatarsalgia, left foot: Secondary | ICD-10-CM | POA: Diagnosis not present

## 2015-07-17 DIAGNOSIS — I10 Essential (primary) hypertension: Secondary | ICD-10-CM | POA: Diagnosis not present

## 2015-07-17 DIAGNOSIS — E782 Mixed hyperlipidemia: Secondary | ICD-10-CM | POA: Diagnosis not present

## 2015-07-24 ENCOUNTER — Telehealth (HOSPITAL_COMMUNITY): Payer: Self-pay

## 2015-07-24 NOTE — Telephone Encounter (Signed)
Patient calling CHF clinic triage line to confirm her apt. Informed patient her apt was this Monday 07/28/15 at 2:00 pm. Patient states she will be here.  Tara Dunn

## 2015-07-28 ENCOUNTER — Encounter (HOSPITAL_COMMUNITY): Payer: Self-pay

## 2015-07-28 ENCOUNTER — Ambulatory Visit (HOSPITAL_COMMUNITY)
Admission: RE | Admit: 2015-07-28 | Discharge: 2015-07-28 | Disposition: A | Discharge status: Home / Self Care | Payer: PPO | Source: Ambulatory Visit | Attending: Cardiology | Admitting: Cardiology

## 2015-07-28 VITALS — BP 152/102 | HR 73 | Resp 20 | Wt 219.5 lb

## 2015-07-28 DIAGNOSIS — I2510 Atherosclerotic heart disease of native coronary artery without angina pectoris: Secondary | ICD-10-CM | POA: Diagnosis not present

## 2015-07-28 DIAGNOSIS — Z8249 Family history of ischemic heart disease and other diseases of the circulatory system: Secondary | ICD-10-CM | POA: Diagnosis not present

## 2015-07-28 DIAGNOSIS — Z7982 Long term (current) use of aspirin: Secondary | ICD-10-CM | POA: Diagnosis not present

## 2015-07-28 DIAGNOSIS — Z8673 Personal history of transient ischemic attack (TIA), and cerebral infarction without residual deficits: Secondary | ICD-10-CM | POA: Diagnosis not present

## 2015-07-28 DIAGNOSIS — Z95810 Presence of automatic (implantable) cardiac defibrillator: Secondary | ICD-10-CM | POA: Diagnosis not present

## 2015-07-28 DIAGNOSIS — Z6838 Body mass index (BMI) 38.0-38.9, adult: Secondary | ICD-10-CM | POA: Insufficient documentation

## 2015-07-28 DIAGNOSIS — Z87891 Personal history of nicotine dependence: Secondary | ICD-10-CM | POA: Insufficient documentation

## 2015-07-28 DIAGNOSIS — M545 Low back pain: Secondary | ICD-10-CM | POA: Insufficient documentation

## 2015-07-28 DIAGNOSIS — N189 Chronic kidney disease, unspecified: Secondary | ICD-10-CM | POA: Diagnosis not present

## 2015-07-28 DIAGNOSIS — Z79899 Other long term (current) drug therapy: Secondary | ICD-10-CM | POA: Diagnosis not present

## 2015-07-28 DIAGNOSIS — M21619 Bunion of unspecified foot: Secondary | ICD-10-CM | POA: Insufficient documentation

## 2015-07-28 DIAGNOSIS — G4733 Obstructive sleep apnea (adult) (pediatric): Secondary | ICD-10-CM | POA: Insufficient documentation

## 2015-07-28 DIAGNOSIS — N183 Chronic kidney disease, stage 3 unspecified: Secondary | ICD-10-CM

## 2015-07-28 DIAGNOSIS — Z9221 Personal history of antineoplastic chemotherapy: Secondary | ICD-10-CM | POA: Insufficient documentation

## 2015-07-28 DIAGNOSIS — I5022 Chronic systolic (congestive) heart failure: Secondary | ICD-10-CM | POA: Diagnosis not present

## 2015-07-28 DIAGNOSIS — I130 Hypertensive heart and chronic kidney disease with heart failure and stage 1 through stage 4 chronic kidney disease, or unspecified chronic kidney disease: Secondary | ICD-10-CM | POA: Insufficient documentation

## 2015-07-28 DIAGNOSIS — I428 Other cardiomyopathies: Secondary | ICD-10-CM | POA: Insufficient documentation

## 2015-07-28 DIAGNOSIS — Z853 Personal history of malignant neoplasm of breast: Secondary | ICD-10-CM | POA: Insufficient documentation

## 2015-07-28 DIAGNOSIS — E669 Obesity, unspecified: Secondary | ICD-10-CM | POA: Insufficient documentation

## 2015-07-28 LAB — BRAIN NATRIURETIC PEPTIDE: B Natriuretic Peptide: 8.9 pg/mL (ref 0.0–100.0)

## 2015-07-28 LAB — BASIC METABOLIC PANEL
Anion gap: 9 (ref 5–15)
BUN: 24 mg/dL — AB (ref 6–20)
CALCIUM: 10.1 mg/dL (ref 8.9–10.3)
CHLORIDE: 108 mmol/L (ref 101–111)
CO2: 20 mmol/L — AB (ref 22–32)
CREATININE: 1.57 mg/dL — AB (ref 0.44–1.00)
GFR calc Af Amer: 36 mL/min — ABNORMAL LOW (ref >60)
GFR calc non Af Amer: 31 mL/min — ABNORMAL LOW (ref >60)
GLUCOSE: 103 mg/dL — AB (ref 65–99)
Potassium: 4.2 mmol/L (ref 3.5–5.1)
Sodium: 137 mmol/L (ref 135–145)

## 2015-07-28 MED ORDER — TRAMADOL HCL 50 MG PO TABS: 50.0000 mg | ORAL_TABLET | Freq: Two times a day (BID) | ORAL | Status: DC | PRN

## 2015-07-28 MED ORDER — FUROSEMIDE 20 MG PO TABS: 20.0000 mg | ORAL_TABLET | ORAL | Status: DC

## 2015-07-28 NOTE — Patient Instructions (Signed)
CHANGE Lasix as follows: Lasix 20 mg once daily for ONE WEEK. THEN reduce to 20 mg EVERY OTHER DAY.  STOP Losartan.  START Entresto 24/26 mg tablet twice daily on Wednesday.  STOP Aleve.  START Tramadol 50 mg every 12 hrs as needed for pain.  Routine lab work today. Will notify you of abnormal results, otherwise no news is good news!  Return on 08/04/15 for repeat labs.  Follow up 3 weeks with Dr. Aundra Dubin.  Do the following things EVERYDAY: 1) Weigh yourself in the morning before breakfast. Write it down and keep it in a log. 2) Take your medicines as prescribed 3) Eat low salt foods-Limit salt (sodium) to 2000 mg per day.  4) Stay as active as you can everyday 5) Limit all fluids for the day to less than 2 liters

## 2015-07-29 NOTE — Progress Notes (Signed)
Patient ID: Tara Dunn, female   DOB: 1940-09-29, 75 y.o.   MRN: [id number] PCP: Dr. Ernie Hew  75 yo with long history of HTN, CVAs, CKD, and systolic CHF.  She moved to [place] from [place] in 2015.   In 3/16, she was admitted a hypertensive emergency and acute CHF.  CTA chest showed no PE.  Echo showed EF 25-30% with moderate LVH.  She was diuresed and had a cardiac catheterization given extensive coronary calcification seen on CTA chest.  Cath showed diffuse moderate CAD, worst was 50-70% mRCA stenosis.   Cardiac MRI in 12/16 showed EF 32%.  ICD was placed in 2/17.    No recent presyncopal spells.  Event monitor in 1/17 showed no significant arrhythmias and she now has an ICD.    She is taking all meds as prescribed. Left foot hurts and needs bunionectomy, walking slowly because of the pain.  She is short of breath walking "long distances."  More limited by foot and knee pain than by dyspnea. Using Aleve 2-3 times/month. No orthopnea/PND.  No chest pain.  Taking Lasix less than once a week. Weight is up 4 lbs.    Labs (3/16): K 4.2, creatinine 1.44, HCT 39.7 Labs (06/05/14): K 4.1, creatinine 2.01 Labs (5/16): K 4.7, creatinine 1.56 Labs (7/16): K 4.7, creatinine 1.68, BNP 14, LDL 62 Labs (11/16): K 4.2, creatinine 1.93 Labs (1/17): K 4.6, creatinine 1.65, HCT 39.2 Labs (4/17): K 4.5, creatinine 1.98  Optivol: Fluid index > threshold, decreasing impedance.  PMH: 1. CVA: 3 prior CVAs, last in 1998.  Thought to be related to HTN. 2. HTN: Long-standing, says she has been hypertensive since age 75. Renal artery dopplers (4/16) were normal.  3. CKD: Suspect related to HTN. 4. Breast cancer: 2009, left mastectomy with chemotherapy.  5. Gout 6. Obese 7. TAH 8. CAD: LHC (3/16) with moderate diffuse disease, worst lesion being 50-70% mid RCA.  9. Cardiomyopathy: Echo (3/16) with EF 25-30%, moderate LVH.  Echo (6/16) with EF 35-40%, moderate LVH, diffuse hypokinesis with septal-lateral  dyssynchrony.  Cardiac MRI (12/16) with EF 32%, diffuse hypokinesis, no contrast given due to CKD.  - Medtronic ICD 2/17.  10. OSA: Using CPAP.  11. Event monitor (1/17) with NSR, occasional PVCs.   SH: From [place], moved to [place] in 2015 to be near daughter.  Single.  Quit smoking 2009.    FH: HTN runs in family.  Father with MI.   ROS: All systems reviewed and negative except as per HPI.   Current Outpatient Prescriptions  Medication Sig Dispense Refill  . allopurinol (ZYLOPRIM) 100 MG tablet Take 100 mg by mouth daily.  0  . amLODipine (NORVASC) 5 MG tablet Take 5 mg by mouth daily.  3  . aspirin EC 81 MG tablet Take 1 tablet (81 mg total) by mouth daily. 90 tablet 3  . atorvastatin (LIPITOR) 40 MG tablet Take 1 tablet (40 mg total) by mouth daily. 90 tablet 3  . carvedilol (COREG) 25 MG tablet Take 1 tablet (25 mg total) by mouth 2 (two) times daily. 180 tablet 3  . colchicine 0.6 MG tablet Take 0.6 mg by mouth daily as needed (gouty flare).    . furosemide (LASIX) 20 MG tablet Take 1 tablet (20 mg total) by mouth every other day. 45 tablet 3  . gabapentin (NEURONTIN) 300 MG capsule Take 300 mg by mouth 3 (three) times daily.    . hydrALAZINE (APRESOLINE) 50 MG tablet Take 1.5 tablets (75 mg  total) by mouth 3 (three) times daily. 270 tablet 3  . isosorbide mononitrate (IMDUR) 60 MG 24 hr tablet Take 1.5 tablets (90 mg total) by mouth daily. 270 tablet 3  . naproxen sodium (ANAPROX) 220 MG tablet Take 220 mg by mouth 2 (two) times daily as needed (back pain).    Marland Kitchen omeprazole (PRILOSEC) 40 MG capsule Take 40 mg by mouth daily.  0  . spironolactone (ALDACTONE) 25 MG tablet Take 1 tablet (25 mg total) by mouth daily. 90 tablet 3  . traMADol (ULTRAM) 50 MG tablet Take 1 tablet (50 mg total) by mouth every 12 (twelve) hours as needed (twice daily as needed for pain). 60 tablet 0   No current facility-administered medications for this encounter.   BP 152/102 mmHg  Pulse 73  Resp 20  Wt 219 lb  8 oz (99.565 kg)  SpO2 96% General: NAD Neck: JVP 8-9 cm with HJR, no thyromegaly or thyroid nodule.  Lungs: Clear to auscultation bilaterally with normal respiratory effort. CV: Nondisplaced PMI.  Heart regular S1/S2, no S3/S4, no murmur. 1+ ankle edema.  No carotid bruit.  Normal pedal pulses.  Abdomen: Soft, nontender, no hepatosplenomegaly, no distention.  Skin: Intact without lesions or rashes.  Neurologic: Alert and oriented x 3.  Psych: Normal affect. Extremities: No clubbing or cyanosis.  HEENT: Normal.   Assessment/Plan: 1. HTN: Long-standing, severe, poorly controlled over time but better now.  Has had history of CVA and CKD.  Renal artery dopplers were normal. BP still high. - Stop losartan, start Entresto 24/26 bid.  - Would avoid NSAID use, can use Tylenol for milder pain and tramadol for more severe pain.   2. CAD: Moderate diffuse CAD, no severe obstruction.  This did not cause her cardiomyopathy.  No chest pain.  - Continue ASA 81 - Continue atorvastatin, check lipids next appt. 3. CKD: Check BMET today. As above, avoid NSAID use.  4. 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  EF has been persistently low, 32% by cardiac MRI in 12/16.  Unable to give contrast to assess for scar/infiltrative disease due to CKD.  She had Medtronic ICD placed in 2/17.  NYHA class II-III symptoms, more limited by foot and knee pain currently.  On exam and by Optivol, however, she is volume overloaded.  Takes Lasix only rarely.  - Start Lasix 20 mg daily, can decrease to every other day after 1 week.   - Continue current Coreg, hydralazine/Imdur and spironolactone.   - As above, stop losartan and start Entresto 24/26 bid.  Repeat BMET 1 week. - I suggested that she start cardiac rehab to try to get more activity, will refer.  5. Low back pain: Avoid NSAIDs. Use Tylenol or tramadol for severe pain.  6. Bunions: Should be reasonable to have bunion surgery once volume  status is improved.   Followup in 3 wks.   Loralie Champagne 07/29/2015

## 2015-08-04 ENCOUNTER — Ambulatory Visit (HOSPITAL_COMMUNITY)
Admission: RE | Admit: 2015-08-04 | Discharge: 2015-08-04 | Disposition: A | Discharge status: Home / Self Care | Payer: PPO | Source: Ambulatory Visit | Attending: Cardiology | Admitting: Cardiology

## 2015-08-04 DIAGNOSIS — I5022 Chronic systolic (congestive) heart failure: Secondary | ICD-10-CM | POA: Diagnosis not present

## 2015-08-04 LAB — BASIC METABOLIC PANEL
ANION GAP: 10 (ref 5–15)
BUN: 38 mg/dL — ABNORMAL HIGH (ref 6–20)
CALCIUM: 10.3 mg/dL (ref 8.9–10.3)
CO2: 22 mmol/L (ref 22–32)
Chloride: 104 mmol/L (ref 101–111)
Creatinine, Ser: 2.01 mg/dL — ABNORMAL HIGH (ref 0.44–1.00)
GFR calc Af Amer: 27 mL/min — ABNORMAL LOW (ref >60)
GFR calc non Af Amer: 23 mL/min — ABNORMAL LOW (ref >60)
GLUCOSE: 94 mg/dL (ref 65–99)
POTASSIUM: 4 mmol/L (ref 3.5–5.1)
Sodium: 136 mmol/L (ref 135–145)

## 2015-08-05 ENCOUNTER — Telehealth (HOSPITAL_COMMUNITY): Payer: Self-pay

## 2015-08-05 NOTE — Telephone Encounter (Signed)
Notes Recorded by Effie Berkshire, RN on 08/05/2015 at 8:38 AM Patient aware and agreeable. Rx updated to preferred pharmacy. Would prefer to come to her scheduled apt in 2 weeks and have labs done then, "i do too much running around". Educated on importance of close lab monitoring with these med titrations. Will call us if she can make it here this Friday or Monday for labs. Notes Recorded by Larey Dresser, MD on 08/04/2015 at 8:57 PM Hold Lasix x 2 days then decrease to every other day. BMET on Friday.

## 2015-08-26 ENCOUNTER — Encounter (HOSPITAL_COMMUNITY): Payer: Self-pay

## 2015-08-26 ENCOUNTER — Ambulatory Visit (HOSPITAL_COMMUNITY)
Admission: RE | Admit: 2015-08-26 | Discharge: 2015-08-26 | Disposition: A | Discharge status: Home / Self Care | Payer: PPO | Source: Ambulatory Visit | Attending: Cardiology | Admitting: Cardiology

## 2015-08-26 VITALS — BP 146/82 | HR 67 | Wt 218.0 lb

## 2015-08-26 DIAGNOSIS — G4733 Obstructive sleep apnea (adult) (pediatric): Secondary | ICD-10-CM | POA: Diagnosis not present

## 2015-08-26 DIAGNOSIS — Z95810 Presence of automatic (implantable) cardiac defibrillator: Secondary | ICD-10-CM | POA: Insufficient documentation

## 2015-08-26 DIAGNOSIS — M109 Gout, unspecified: Secondary | ICD-10-CM | POA: Insufficient documentation

## 2015-08-26 DIAGNOSIS — N183 Chronic kidney disease, stage 3 unspecified: Secondary | ICD-10-CM

## 2015-08-26 DIAGNOSIS — Z7982 Long term (current) use of aspirin: Secondary | ICD-10-CM | POA: Insufficient documentation

## 2015-08-26 DIAGNOSIS — Z9012 Acquired absence of left breast and nipple: Secondary | ICD-10-CM | POA: Diagnosis not present

## 2015-08-26 DIAGNOSIS — Z9221 Personal history of antineoplastic chemotherapy: Secondary | ICD-10-CM | POA: Diagnosis not present

## 2015-08-26 DIAGNOSIS — M545 Low back pain: Secondary | ICD-10-CM | POA: Diagnosis not present

## 2015-08-26 DIAGNOSIS — E669 Obesity, unspecified: Secondary | ICD-10-CM | POA: Diagnosis not present

## 2015-08-26 DIAGNOSIS — N189 Chronic kidney disease, unspecified: Secondary | ICD-10-CM | POA: Diagnosis not present

## 2015-08-26 DIAGNOSIS — Z8249 Family history of ischemic heart disease and other diseases of the circulatory system: Secondary | ICD-10-CM | POA: Diagnosis not present

## 2015-08-26 DIAGNOSIS — I5022 Chronic systolic (congestive) heart failure: Secondary | ICD-10-CM | POA: Diagnosis not present

## 2015-08-26 DIAGNOSIS — Z8673 Personal history of transient ischemic attack (TIA), and cerebral infarction without residual deficits: Secondary | ICD-10-CM | POA: Insufficient documentation

## 2015-08-26 DIAGNOSIS — Z853 Personal history of malignant neoplasm of breast: Secondary | ICD-10-CM | POA: Insufficient documentation

## 2015-08-26 DIAGNOSIS — R5383 Other fatigue: Secondary | ICD-10-CM | POA: Insufficient documentation

## 2015-08-26 DIAGNOSIS — I2510 Atherosclerotic heart disease of native coronary artery without angina pectoris: Secondary | ICD-10-CM | POA: Diagnosis not present

## 2015-08-26 DIAGNOSIS — M2550 Pain in unspecified joint: Secondary | ICD-10-CM | POA: Insufficient documentation

## 2015-08-26 DIAGNOSIS — I429 Cardiomyopathy, unspecified: Secondary | ICD-10-CM | POA: Diagnosis not present

## 2015-08-26 DIAGNOSIS — I130 Hypertensive heart and chronic kidney disease with heart failure and stage 1 through stage 4 chronic kidney disease, or unspecified chronic kidney disease: Secondary | ICD-10-CM | POA: Diagnosis not present

## 2015-08-26 DIAGNOSIS — R531 Weakness: Secondary | ICD-10-CM

## 2015-08-26 DIAGNOSIS — Z87891 Personal history of nicotine dependence: Secondary | ICD-10-CM | POA: Insufficient documentation

## 2015-08-26 LAB — BASIC METABOLIC PANEL
ANION GAP: 7 (ref 5–15)
BUN: 24 mg/dL — ABNORMAL HIGH (ref 6–20)
CALCIUM: 10 mg/dL (ref 8.9–10.3)
CO2: 24 mmol/L (ref 22–32)
Chloride: 106 mmol/L (ref 101–111)
Creatinine, Ser: 1.67 mg/dL — ABNORMAL HIGH (ref 0.44–1.00)
GFR calc non Af Amer: 29 mL/min — ABNORMAL LOW (ref >60)
GFR, EST AFRICAN AMERICAN: 33 mL/min — AB (ref >60)
Glucose, Bld: 98 mg/dL (ref 65–99)
Potassium: 4 mmol/L (ref 3.5–5.1)
SODIUM: 137 mmol/L (ref 135–145)

## 2015-08-26 LAB — BRAIN NATRIURETIC PEPTIDE: B NATRIURETIC PEPTIDE 5: 13.7 pg/mL (ref 0.0–100.0)

## 2015-08-26 MED ORDER — SACUBITRIL-VALSARTAN 24-26 MG PO TABS
1.0000 | ORAL_TABLET | Freq: Two times a day (BID) | ORAL | Status: DC
Start: 2015-08-26 — End: 2015-11-11

## 2015-08-26 MED ORDER — FUROSEMIDE 20 MG PO TABS: 20.0000 mg | ORAL_TABLET | ORAL | Status: DC

## 2015-08-26 MED ORDER — HYDRALAZINE HCL 50 MG PO TABS: 75.0000 mg | ORAL_TABLET | Freq: Three times a day (TID) | ORAL | Status: DC

## 2015-08-26 NOTE — Patient Instructions (Signed)
Stop the Carvedilol 12.5 mg tabs  Only take Carvedilol 25 mg Twice daily   Stop Losartan   Start Entresto 24/26 mg Twice daily   Decrease Furosemide (Lasix) to 20 mg every other day   Labs today  Labs in 1 week, can be done by home health  You have been referred to [facility] for home health nurse and Physical Therapy  Your physician recommends that you schedule a follow-up appointment in: 2 months

## 2015-08-26 NOTE — Progress Notes (Addendum)
Advanced Heart Failure Medication Review by a Pharmacist  Does the patient  feel that his/her medications are working for him/her?  yes  Has the patient been experiencing any side effects to the medications prescribed?  no  Does the patient measure his/her own blood pressure or blood glucose at home?  no   Does the patient have any problems obtaining medications due to transportation or finances?   no  Understanding of regimen: fair Understanding of indications: fair Potential of compliance: fair Patient understands to avoid NSAIDs. Patient understands to avoid decongestants.  Issues to address at subsequent visits: Compliance   Pharmacist comments:  Tara Dunn is a pleasant 75 yo F presenting with her medication bottles. She reports fair compliance with her regimen but admits to missing 2-3 doses per week of her medications 2/2 forgetting to take them. I did notice that she is taking both carvedilol 12.5 mg (1.5 tablets) BID and carvedilol 25 mg BID. She is only taking furosemide PRN and has not taken any for at least 3 weeks. She also never switched from losartan to [facility] 2/2 confusion about where to get her Entresto. We did briefly review each of her HF medications and the importance of continued consistent use and she verbalized understanding.   Since she is to switch to [facility], have counseled her on proper use and side effects. I will initiate her PA and enroll her in [employer] since she cannot afford the $85/mo copay and send to [facility] on [address] since mail order pharmacy will not accept [employer] [facility]. She will have $800 toward copay costs through 08/24/2016. Will relay info to [employer].  Billing ID: [id number] Person Code: [id number] RX Group: [id number] RX BIN: [id number] PCN for Part D: [id number]     [name]. [name], PharmD, BCPS, CPP Clinical Pharmacist Pager: [phone] Phone: [phone] 08/26/2015 3:12 PM      Time with patient: 12 minutes Preparation and  documentation time: 16 minutes Total time: 28 minutes

## 2015-08-27 ENCOUNTER — Other Ambulatory Visit (HOSPITAL_COMMUNITY): Payer: Self-pay | Admitting: *Deleted

## 2015-08-27 NOTE — Progress Notes (Signed)
Patient ID: Tara Dunn, female   DOB: 10-22-1940, 75 y.o.   MRN: [id number] PCP: Dr. Ernie Hew  75 yo with long history of HTN, CVAs, CKD, and systolic CHF.  She moved to [place] from [place] in 2015.   In 3/16, she was admitted a hypertensive emergency and acute CHF.  CTA chest showed no PE.  Echo showed EF 25-30% with moderate LVH.  She was diuresed and had a cardiac catheterization given extensive coronary calcification seen on CTA chest.  Cath showed diffuse moderate CAD, worst was 50-70% mRCA stenosis.   Cardiac MRI in 12/16 showed EF 32%.  ICD was placed in 2/17.    No recent presyncopal spells.  Event monitor in 1/17 showed no significant arrhythmias and she now has an ICD.    She has not taken any Lasix in about 3 wks.  She feels generally fatigued.  She is actually taking 37.5 mg bid Coreg instead of 25 mg bid Coreg.  She has a gout flare in her left foot and is on colchicine.  She is not short of breath but has not been very active recently due to foot pain.  No orthopnea/PND.    Labs (3/16): K 4.2, creatinine 1.44, HCT 39.7 Labs (06/05/14): K 4.1, creatinine 2.01 Labs (5/16): K 4.7, creatinine 1.56 Labs (7/16): K 4.7, creatinine 1.68, BNP 14, LDL 62 Labs (11/16): K 4.2, creatinine 1.93 Labs (1/17): K 4.6, creatinine 1.65, HCT 39.2 Labs (4/17): K 4.5, creatinine 1.98 Labs (6/17): K 4, creatinine 2  Optivol: Fluid index < threshold, but decreasing impedance.  PMH: 1. CVA: 3 prior CVAs, last in 1998.  Thought to be related to HTN. 2. HTN: Long-standing, says she has been hypertensive since age 70. Renal artery dopplers (4/16) were normal.  3. CKD: Suspect related to HTN. 4. Breast cancer: 2009, left mastectomy with chemotherapy.  5. Gout 6. Obese 7. TAH 8. CAD: LHC (3/16) with moderate diffuse disease, worst lesion being 50-70% mid RCA.  9. Cardiomyopathy: Echo (3/16) with EF 25-30%, moderate LVH.  Echo (6/16) with EF 35-40%, moderate LVH, diffuse hypokinesis with  septal-lateral dyssynchrony.  Cardiac MRI (12/16) with EF 32%, diffuse hypokinesis, no contrast given due to CKD.  - Medtronic ICD 2/17.  10. OSA: Using CPAP.  11. Event monitor (1/17) with NSR, occasional PVCs.   SH: From [place], moved to [place] in 2015 to be near daughter.  Single.  Quit smoking 2009.    FH: HTN runs in family.  Father with MI.   ROS: All systems reviewed and negative except as per HPI.   Current Outpatient Prescriptions  Medication Sig Dispense Refill  . allopurinol (ZYLOPRIM) 100 MG tablet Take 200 mg by mouth daily.   0  . amLODipine (NORVASC) 5 MG tablet Take 5 mg by mouth daily.  3  . aspirin EC 81 MG tablet Take 1 tablet (81 mg total) by mouth daily. 90 tablet 3  . atorvastatin (LIPITOR) 40 MG tablet Take 1 tablet (40 mg total) by mouth daily. 90 tablet 3  . carvedilol (COREG) 25 MG tablet Take 1 tablet (25 mg total) by mouth 2 (two) times daily. 180 tablet 3  . colchicine 0.6 MG tablet Take 0.6 mg by mouth daily as needed (gouty flare).    . furosemide (LASIX) 20 MG tablet Take 1 tablet (20 mg total) by mouth every other day. 30 tablet   . gabapentin (NEURONTIN) 300 MG capsule Take 300 mg by mouth 3 (three) times daily.    Marland Kitchen  hydrALAZINE (APRESOLINE) 50 MG tablet Take 1.5 tablets (75 mg total) by mouth 3 (three) times daily. 270 tablet 3  . isosorbide mononitrate (IMDUR) 60 MG 24 hr tablet Take 1.5 tablets (90 mg total) by mouth daily. 270 tablet 3  . omeprazole (PRILOSEC) 40 MG capsule Take 40 mg by mouth daily.  0  . spironolactone (ALDACTONE) 25 MG tablet Take 1 tablet (25 mg total) by mouth daily. 90 tablet 3  . traMADol (ULTRAM) 50 MG tablet Take 1 tablet (50 mg total) by mouth every 12 (twelve) hours as needed (twice daily as needed for pain). 60 tablet 0  . naproxen sodium (ANAPROX) 220 MG tablet Take 220 mg by mouth 2 (two) times daily as needed (back pain). Reported on 08/26/2015    . sacubitril-valsartan (ENTRESTO) 24-26 MG Take 1 tablet by mouth 2 (two) times  daily. 60 tablet 3   No current facility-administered medications for this encounter.   BP 146/82 mmHg  Pulse 67  Wt 218 lb (98.884 kg)  SpO2 98% General: NAD Neck: JVP 8 cm with HJR, no thyromegaly or thyroid nodule.  Lungs: Clear to auscultation bilaterally with normal respiratory effort. CV: Nondisplaced PMI.  Heart regular S1/S2, no S3/S4, no murmur. 1+ ankle edema.  No carotid bruit.  Normal pedal pulses.  Abdomen: Soft, nontender, no hepatosplenomegaly, no distention.  Skin: Intact without lesions or rashes.  Neurologic: Alert and oriented x 3.  Psych: Normal affect. Extremities: No clubbing or cyanosis.  HEENT: Normal.   Assessment/Plan: 1. HTN: Long-standing, severe, poorly controlled over time but better now.  Has had history of CVA and CKD.  Renal artery dopplers were normal. BP still mildly high. - Stop losartan, start Entresto 24/26 bid. She was supposed to do this after last appointment but never made this change.  - Would avoid NSAID use, can use Tylenol for milder pain and tramadol for more severe pain.   2. CAD: Moderate diffuse CAD, no severe obstruction.  This did not cause her cardiomyopathy.  No chest pain.  - Continue ASA 81 - Continue atorvastatin, check lipids next appt. 3. CKD: Check BMET today. As above, avoid NSAID use.  4. 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  EF has been persistently low, 32% by cardiac MRI in 12/16.  Unable to give contrast to assess for scar/infiltrative disease due to CKD.  She had Medtronic ICD placed in 2/17.  NYHA class II-III symptoms, more limited by gout pain currently.  She has not been taking Lasix and now is mildly volume overloaded with thoracic impedance by Optivol starting to trend down.  - Take Lasix 20 mg every other day. BMET in 1 week.    - Continue current hydralazine/Imdur and spironolactone.   - As above, stop losartan and start Entresto 24/26 bid.  Repeat BMET 1 week. - Decrease  Coreg to 25 mg bid.  High dose of Coreg may be contributing to fatigue.   - Refer to cardiac rehab.  5. Low back and joint pain: Avoid NSAIDs. Use Tylenol or tramadol for severe pain.   Followup in 1 month.   Loralie Champagne 08/27/2015

## 2015-08-28 ENCOUNTER — Other Ambulatory Visit (HOSPITAL_COMMUNITY): Payer: Self-pay | Admitting: *Deleted

## 2015-08-29 ENCOUNTER — Telehealth (HOSPITAL_COMMUNITY): Payer: Self-pay

## 2015-08-29 MED ORDER — HYDRALAZINE HCL 50 MG PO TABS: 75.0000 mg | ORAL_TABLET | Freq: Three times a day (TID) | ORAL | Status: DC

## 2015-08-29 NOTE — Telephone Encounter (Signed)
[facility] called to get 90 day supply sent in for hydralazine. Rx sent for 405 tabs as requested  Renee Pain

## 2015-08-30 DIAGNOSIS — Z8673 Personal history of transient ischemic attack (TIA), and cerebral infarction without residual deficits: Secondary | ICD-10-CM | POA: Diagnosis not present

## 2015-08-30 DIAGNOSIS — Z95810 Presence of automatic (implantable) cardiac defibrillator: Secondary | ICD-10-CM | POA: Diagnosis not present

## 2015-08-30 DIAGNOSIS — Z6830 Body mass index (BMI) 30.0-30.9, adult: Secondary | ICD-10-CM | POA: Diagnosis not present

## 2015-08-30 DIAGNOSIS — Z87891 Personal history of nicotine dependence: Secondary | ICD-10-CM | POA: Diagnosis not present

## 2015-08-30 DIAGNOSIS — I130 Hypertensive heart and chronic kidney disease with heart failure and stage 1 through stage 4 chronic kidney disease, or unspecified chronic kidney disease: Secondary | ICD-10-CM | POA: Diagnosis not present

## 2015-08-30 DIAGNOSIS — G4700 Insomnia, unspecified: Secondary | ICD-10-CM | POA: Diagnosis not present

## 2015-08-30 DIAGNOSIS — N183 Chronic kidney disease, stage 3 (moderate): Secondary | ICD-10-CM | POA: Diagnosis not present

## 2015-08-30 DIAGNOSIS — I5022 Chronic systolic (congestive) heart failure: Secondary | ICD-10-CM | POA: Diagnosis not present

## 2015-08-30 DIAGNOSIS — E669 Obesity, unspecified: Secondary | ICD-10-CM | POA: Diagnosis not present

## 2015-09-03 ENCOUNTER — Telehealth (HOSPITAL_COMMUNITY): Payer: Self-pay | Admitting: Pharmacist

## 2015-09-03 NOTE — Telephone Encounter (Signed)
Entresto 24-26 mg BID PA approved by [employer] Part [employer] through 02/08/16.   [name]. [name], PharmD, BCPS, CPP Clinical Pharmacist Pager: [phone] Phone: [phone] 09/03/2015 3:37 PM

## 2015-09-09 DIAGNOSIS — I5022 Chronic systolic (congestive) heart failure: Secondary | ICD-10-CM | POA: Diagnosis not present

## 2015-09-09 DIAGNOSIS — I509 Heart failure, unspecified: Secondary | ICD-10-CM | POA: Diagnosis not present

## 2015-09-09 DIAGNOSIS — E669 Obesity, unspecified: Secondary | ICD-10-CM | POA: Diagnosis not present

## 2015-09-09 DIAGNOSIS — Z95810 Presence of automatic (implantable) cardiac defibrillator: Secondary | ICD-10-CM | POA: Diagnosis not present

## 2015-09-09 DIAGNOSIS — Z6830 Body mass index (BMI) 30.0-30.9, adult: Secondary | ICD-10-CM | POA: Diagnosis not present

## 2015-09-09 DIAGNOSIS — G4700 Insomnia, unspecified: Secondary | ICD-10-CM | POA: Diagnosis not present

## 2015-09-09 DIAGNOSIS — N183 Chronic kidney disease, stage 3 (moderate): Secondary | ICD-10-CM | POA: Diagnosis not present

## 2015-09-09 DIAGNOSIS — Z87891 Personal history of nicotine dependence: Secondary | ICD-10-CM | POA: Diagnosis not present

## 2015-09-09 DIAGNOSIS — Z8673 Personal history of transient ischemic attack (TIA), and cerebral infarction without residual deficits: Secondary | ICD-10-CM | POA: Diagnosis not present

## 2015-09-09 DIAGNOSIS — I130 Hypertensive heart and chronic kidney disease with heart failure and stage 1 through stage 4 chronic kidney disease, or unspecified chronic kidney disease: Secondary | ICD-10-CM | POA: Diagnosis not present

## 2015-09-19 ENCOUNTER — Other Ambulatory Visit (HOSPITAL_COMMUNITY): Payer: Self-pay | Admitting: Cardiology

## 2015-09-22 ENCOUNTER — Telehealth: Payer: Self-pay | Admitting: Internal Medicine

## 2015-09-22 NOTE — Telephone Encounter (Signed)
Patient stated that it was [facility] that called her, not our office.   I let her know if she needed anything from me to call me back and let me know.

## 2015-09-22 NOTE — Telephone Encounter (Signed)
New message     Pt called stated that someone called her about sleep study. Please call.

## 2015-09-23 ENCOUNTER — Telehealth (HOSPITAL_COMMUNITY): Payer: Self-pay | Admitting: Pharmacist

## 2015-09-23 NOTE — Telephone Encounter (Signed)
Pharmacist at [facility] for help with processing Entresto through [employer]. Gave her the information again and she was able to process the [facility] for $0.   Alyzae Hawkey K. [name], PharmD, BCPS, CPP Clinical Pharmacist Pager: [phone] Phone: [phone] 09/23/2015 3:39 PM

## 2015-09-24 ENCOUNTER — Encounter: Payer: PPO | Admitting: *Deleted

## 2015-09-24 ENCOUNTER — Telehealth: Payer: Self-pay | Admitting: Cardiology

## 2015-09-24 NOTE — Telephone Encounter (Signed)
LMOVM reminding pt to send remote transmission.   

## 2015-09-26 ENCOUNTER — Encounter: Payer: Self-pay | Admitting: Cardiology

## 2015-10-06 ENCOUNTER — Telehealth: Payer: Self-pay | Admitting: Cardiology

## 2015-10-06 DIAGNOSIS — G4733 Obstructive sleep apnea (adult) (pediatric): Secondary | ICD-10-CM

## 2015-10-06 NOTE — Telephone Encounter (Signed)
Patient stated that she is not having trouble with her CPAP Machine. She is in need of a new mask order to be sent to [facility].   I advised her that I would put the order in, and as soon as it was signed by Dr. Radford Pax I would fax it over to [facility].  She thanked me for the assistance.

## 2015-10-06 NOTE — Telephone Encounter (Signed)
Patient states she is having trouble with her CPAP machine.

## 2015-10-08 ENCOUNTER — Telehealth: Payer: Self-pay | Admitting: Cardiology

## 2015-10-08 NOTE — Telephone Encounter (Signed)
New message      Pt is calling stating that she does not have the nose strips for her breathing machine for sleep apnea. They were never mailed to her. She was told she had to see the dr before she can get them. She wants to know if she can go without them until she can get an appt in November with [facility]. Please call.

## 2015-10-09 NOTE — Telephone Encounter (Signed)
F/u Message  Pt call requesting to speak with RN about her mask for her Cpap machine. Pt wants to know will she be okay to do without it until her appt on 11/1. Please call back to discuss

## 2015-10-10 NOTE — Telephone Encounter (Signed)
Message being sent to [facility] to see if they can help her with a mask until she has her appointment. Since she is already scheduled,  I will try to help her out so that she is not without her CPAP.

## 2015-10-17 DIAGNOSIS — Z23 Encounter for immunization: Secondary | ICD-10-CM | POA: Diagnosis not present

## 2015-10-17 DIAGNOSIS — Z0000 Encounter for general adult medical examination without abnormal findings: Secondary | ICD-10-CM | POA: Diagnosis not present

## 2015-10-17 DIAGNOSIS — Z1211 Encounter for screening for malignant neoplasm of colon: Secondary | ICD-10-CM | POA: Diagnosis not present

## 2015-10-23 DIAGNOSIS — G4733 Obstructive sleep apnea (adult) (pediatric): Secondary | ICD-10-CM | POA: Diagnosis not present

## 2015-10-27 NOTE — Progress Notes (Signed)
Patient ID: Tara Dunn, female   DOB: 12-23-1940, 75 y.o.   MRN: [id number]     Advanced Heart Failure Clinic Note   PCP: Dr. Ernie Hew HF: Dr. Aundra Dubin   75 yo with long history of HTN, CVAs, CKD, and systolic CHF.  She moved to [place] from [place] in 2015.   In 3/16, she was admitted a hypertensive emergency and acute CHF.  CTA chest showed no PE.  Echo showed EF 25-30% with moderate LVH.  She was diuresed and had a cardiac catheterization given extensive coronary calcification seen on CTA chest.  Cath showed diffuse moderate CAD, worst was 50-70% mRCA stenosis.   Cardiac MRI in 12/16 showed EF 32%.  ICD was placed in 2/17.    She presents today for regular follow up. Feeling weak and tired today, says it happens occasionally.  Last visit was switched to [facility] and coreg decreased (pt had been mistakenly taking 37.5 mg BID). Denies any pain. Breathing slightly labored today.  Still complaining of lots of fatigue. Hasn't worn CPAP over past couple of months. Waiting on new mask, supposed to come this week. Pees OK on lasix. Gout and joint pain bothers her a lot. Denies Orthopnea/PND.  Occasional lightheadedness but not limiting.  Some days feels like she falls asleep while sitting, and "loses time".  Denies unilateral weakness, HAs, or stroke symptoms. (has hx of 3 CVAs, not similar to this).   Labs (3/16): K 4.2, creatinine 1.44, HCT 39.7 Labs (06/05/14): K 4.1, creatinine 2.01 Labs (5/16): K 4.7, creatinine 1.56 Labs (7/16): K 4.7, creatinine 1.68, BNP 14, LDL 62 Labs (11/16): K 4.2, creatinine 1.93 Labs (1/17): K 4.6, creatinine 1.65, HCT 39.2 Labs (4/17): K 4.5, creatinine 1.98 Labs (6/17): K 4, creatinine 2 Labs (8/17): K 4.4, creatinine 1.64    Optivol: No AT/AF/VT/VF, Optivol trending up, Minimal patient activity per day. Thoracic impedence down over the past few weeks.   PMH: 1. CVA: 3 prior CVAs, last in 1998.  Thought to be related to HTN. 2. HTN: Long-standing, says she  has been hypertensive since age 75. Renal artery dopplers (4/16) were normal.  3. CKD: Suspect related to HTN. 4. Breast cancer: 2009, left mastectomy with chemotherapy.  5. Gout 6. Obese 7. TAH 8. CAD: LHC (3/16) with moderate diffuse disease, worst lesion being 50-70% mid RCA.  9. Cardiomyopathy: Echo (3/16) with EF 25-30%, moderate LVH.  Echo (6/16) with EF 35-40%, moderate LVH, diffuse hypokinesis with septal-lateral dyssynchrony.  Cardiac MRI (12/16) with EF 32%, diffuse hypokinesis, no contrast given due to CKD.  - Medtronic ICD 2/17.  10. OSA: Using CPAP.  11. Event monitor (1/17) with NSR, occasional PVCs.   SH: From [place], moved to [place] in 2015 to be near daughter.  Single.  Quit smoking 2009.    FH: HTN runs in family.  Father with MI.   ROS: All systems reviewed and negative except as per HPI.   Current Outpatient Prescriptions  Medication Sig Dispense Refill  . allopurinol (ZYLOPRIM) 100 MG tablet Take 200 mg by mouth daily.   0  . aspirin EC 81 MG tablet Take 1 tablet (81 mg total) by mouth daily. 90 tablet 3  . atorvastatin (LIPITOR) 40 MG tablet Take 1 tablet (40 mg total) by mouth daily. 90 tablet 3  . carvedilol (COREG) 25 MG tablet Take 1 tablet (25 mg total) by mouth 2 (two) times daily. 180 tablet 3  . furosemide (LASIX) 20 MG tablet Take 1 tablet (20  mg total) by mouth every other day. 30 tablet   . gabapentin (NEURONTIN) 300 MG capsule Take 300 mg by mouth 3 (three) times daily.    . hydrALAZINE (APRESOLINE) 50 MG tablet Take 1.5 tablets (75 mg total) by mouth 3 (three) times daily. 405 tablet 3  . isosorbide mononitrate (IMDUR) 60 MG 24 hr tablet Take 1.5 tablets (90 mg total) by mouth daily. 270 tablet 3  . omeprazole (PRILOSEC) 40 MG capsule Take 40 mg by mouth daily.  0  . sacubitril-valsartan (ENTRESTO) 24-26 MG Take 1 tablet by mouth 2 (two) times daily. 60 tablet 3  . spironolactone (ALDACTONE) 25 MG tablet Take 1 tablet (25 mg total) by mouth daily. 90 tablet 3   . amLODipine (NORVASC) 5 MG tablet Take 5 mg by mouth daily.  3  . colchicine 0.6 MG tablet Take 0.6 mg by mouth daily as needed (gouty flare).    . naproxen sodium (ANAPROX) 220 MG tablet Take 220 mg by mouth 2 (two) times daily as needed (back pain). Reported on 08/26/2015    . traMADol (ULTRAM) 50 MG tablet Take 1 tablet (50 mg total) by mouth every 12 (twelve) hours as needed (twice daily as needed for pain). (Patient not taking: Reported on 10/28/2015) 60 tablet 0   No current facility-administered medications for this encounter.    BP 122/88 (BP Location: Right Arm, Patient Position: Sitting, Cuff Size: Large)   Pulse 76   Wt 218 lb 6.4 oz (99.1 kg)   SpO2 96%   BMI 38.69 kg/m    Wt Readings from Last 3 Encounters:  10/28/15 218 lb 6.4 oz (99.1 kg)  08/26/15 218 lb (98.9 kg)  07/28/15 219 lb 8 oz (99.6 kg)    General: NAD HEENT: Normal.  Neck: JVP difficult to assess, appears 8-9 cm with mild HJR. no thyromegaly or thyroid nodule.  Lungs: CTAB, normal effort CV: Nondisplaced PMI.  Heart regular S1/S2, no S3/S4, no murmur. 1+ ankle edema.  No carotid bruit.  Normal pedal pulses.  Abdomen: Soft, NT, ND, no HSM. No bruits or masses. +BS  Skin: Intact without lesions or rashes.  Neurologic: Alert and oriented x 3.  Psych: Normal affect. Extremities: No clubbing or cyanosis.   Assessment/Plan: 1. HTN:  - Stable today, and has not had meds.  Has had history of CVA and CKD.  Renal artery dopplers were normal.  - Continue Entresto 24/26 bid. BMET today. Will not increase today with occasional lightheadedness. Anticipate increasing at next visit.  - Would avoid NSAID use, can use Tylenol for milder pain and tramadol for more severe pain.   2. CAD: Moderate diffuse CAD, no severe obstruction.  This did not cause her cardiomyopathy.  No chest pain.  - Continue ASA 81 - Continue atorvastatin 40 mg daily. Lipids today.  3. CKD III: Check BMET today. As above, avoid NSAID use.  4.  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  EF has been persistently low, 32% by cardiac MRI in 12/16.  Unable to give contrast to assess for scar/infiltrative disease due to CKD.  She had Medtronic ICD placed in 2/17.  NYHA class II-III symptoms, more limited by gout pain currently.  She has not been taking Lasix and now is mildly volume overloaded with thoracic impedance by Optivol starting to trend down.  - Take Lasix 20 mg daily for next 4 days.  BMET/BNP today.     - Continue current hydralazine/Imdur and spironolactone.   -  Continue Entresto 24/26 bid.  - Continue Coreg 25 mg bid.   - Refer to Cardiac Rehab.   5. Low back and joint pain: Avoid NSAIDs. Use Tylenol or tramadol for severe pain.  6. OSA - Has sleep study scheduled for early November.  Take extra lasix as above.  Think major problem is fatigue from deconditioning and CPAP non-compliance due to not having correct mask.  She is supposed to get mask this week. I have asked her to call Dr. Radford Pax office if she does not get mask this week.   Labs today and follow up 2 weeks with fatigue and mild volume overload.   Shirley Friar, PA-C 10/28/2015  Total time spent > 25 minutes, over half that spent discussing the above.

## 2015-10-28 ENCOUNTER — Ambulatory Visit (HOSPITAL_COMMUNITY)
Admission: RE | Admit: 2015-10-28 | Discharge: 2015-10-28 | Disposition: A | Discharge status: Home / Self Care | Payer: PPO | Source: Ambulatory Visit | Attending: Cardiology | Admitting: Cardiology

## 2015-10-28 VITALS — BP 122/88 | HR 76 | Wt 218.4 lb

## 2015-10-28 DIAGNOSIS — N183 Chronic kidney disease, stage 3 unspecified: Secondary | ICD-10-CM

## 2015-10-28 DIAGNOSIS — Z853 Personal history of malignant neoplasm of breast: Secondary | ICD-10-CM | POA: Insufficient documentation

## 2015-10-28 DIAGNOSIS — I130 Hypertensive heart and chronic kidney disease with heart failure and stage 1 through stage 4 chronic kidney disease, or unspecified chronic kidney disease: Secondary | ICD-10-CM | POA: Diagnosis not present

## 2015-10-28 DIAGNOSIS — E669 Obesity, unspecified: Secondary | ICD-10-CM

## 2015-10-28 DIAGNOSIS — M109 Gout, unspecified: Secondary | ICD-10-CM | POA: Insufficient documentation

## 2015-10-28 DIAGNOSIS — I2510 Atherosclerotic heart disease of native coronary artery without angina pectoris: Secondary | ICD-10-CM | POA: Diagnosis not present

## 2015-10-28 DIAGNOSIS — I10 Essential (primary) hypertension: Secondary | ICD-10-CM | POA: Diagnosis not present

## 2015-10-28 DIAGNOSIS — Z7982 Long term (current) use of aspirin: Secondary | ICD-10-CM | POA: Diagnosis not present

## 2015-10-28 DIAGNOSIS — Z87891 Personal history of nicotine dependence: Secondary | ICD-10-CM | POA: Diagnosis not present

## 2015-10-28 DIAGNOSIS — Z9119 Patient's noncompliance with other medical treatment and regimen: Secondary | ICD-10-CM | POA: Insufficient documentation

## 2015-10-28 DIAGNOSIS — Z79899 Other long term (current) drug therapy: Secondary | ICD-10-CM | POA: Insufficient documentation

## 2015-10-28 DIAGNOSIS — I5022 Chronic systolic (congestive) heart failure: Secondary | ICD-10-CM | POA: Diagnosis not present

## 2015-10-28 DIAGNOSIS — I429 Cardiomyopathy, unspecified: Secondary | ICD-10-CM | POA: Diagnosis not present

## 2015-10-28 DIAGNOSIS — Z8673 Personal history of transient ischemic attack (TIA), and cerebral infarction without residual deficits: Secondary | ICD-10-CM | POA: Diagnosis not present

## 2015-10-28 DIAGNOSIS — G4733 Obstructive sleep apnea (adult) (pediatric): Secondary | ICD-10-CM | POA: Diagnosis not present

## 2015-10-28 LAB — BASIC METABOLIC PANEL
ANION GAP: 10 (ref 5–15)
BUN: 20 mg/dL (ref 6–20)
CALCIUM: 10 mg/dL (ref 8.9–10.3)
CO2: 20 mmol/L — ABNORMAL LOW (ref 22–32)
Chloride: 109 mmol/L (ref 101–111)
Creatinine, Ser: 1.44 mg/dL — ABNORMAL HIGH (ref 0.44–1.00)
GFR, EST AFRICAN AMERICAN: 40 mL/min — AB (ref >60)
GFR, EST NON AFRICAN AMERICAN: 35 mL/min — AB (ref >60)
GLUCOSE: 92 mg/dL (ref 65–99)
POTASSIUM: 3.9 mmol/L (ref 3.5–5.1)
Sodium: 139 mmol/L (ref 135–145)

## 2015-10-28 LAB — BRAIN NATRIURETIC PEPTIDE: B NATRIURETIC PEPTIDE 5: 84.2 pg/mL (ref 0.0–100.0)

## 2015-10-28 LAB — LIPID PANEL
CHOLESTEROL: 123 mg/dL (ref 0–200)
HDL: 50 mg/dL (ref >40)
LDL CALC: 57 mg/dL (ref 0–99)
TRIGLYCERIDES: 79 mg/dL (ref <150)
Total CHOL/HDL Ratio: 2.5 RATIO
VLDL: 16 mg/dL (ref 0–40)

## 2015-10-28 NOTE — Progress Notes (Addendum)
Advanced Heart Failure Medication Review by a Pharmacist  Does the patient  feel that his/her medications are working for him/her?  yes  Has the patient been experiencing any side effects to the medications prescribed?  no  Does the patient measure his/her own blood pressure or blood glucose at home?  no   Does the patient have any problems obtaining medications due to transportation or finances?   no  Understanding of regimen: fair Understanding of indications: fair Potential of compliance: good Patient understands to avoid NSAIDs. Patient understands to avoid decongestants.  Issues to address at subsequent visits: None   Pharmacist comments:  Tara Dunn is a pleasant 75 yo F presenting with her medication bottles. She reports good compliance with her regimen but does admit to taking some of her medications later than usual (never misses full day of medications). She also had an empty bottle of amlodipine from 05/2015 and was not sure if she is still taking it. Called and verified with [facility] that they have not filled it for her since April.   [name]. [name], PharmD, BCPS, CPP Clinical Pharmacist Pager: [phone] Phone: [phone] 10/28/2015 9:57 AM      Time with patient: 10 minutes Preparation and documentation time: 8 minutes Total time: 18 minutes

## 2015-10-28 NOTE — Addendum Note (Signed)
Encounter addended by: Adora Fridge, RPH on: 10/28/2015 11:46 AM<BR>    Actions taken: Order Reconciliation Section accessed, Order Entry activity accessed, Sign clinical note

## 2015-10-28 NOTE — Patient Instructions (Signed)
Be sure to take a lasix for the next 4 days, then resume every other day  Labs today  Your physician recommends that you schedule a follow-up appointment in: 2 weeks with Oda Kilts, PA  Do the following things EVERYDAY: 1) Weigh yourself in the morning before breakfast. Write it down and keep it in a log. 2) Take your medicines as prescribed 3) Eat low salt foods-Limit salt (sodium) to 2000 mg per day.  4) Stay as active as you can everyday 5) Limit all fluids for the day to less than 2 liters

## 2015-10-29 ENCOUNTER — Encounter: Payer: Self-pay | Admitting: Internal Medicine

## 2015-10-29 ENCOUNTER — Encounter (HOSPITAL_COMMUNITY): Payer: Self-pay | Admitting: Cardiology

## 2015-11-11 ENCOUNTER — Encounter (HOSPITAL_COMMUNITY): Payer: Self-pay

## 2015-11-11 ENCOUNTER — Ambulatory Visit (HOSPITAL_COMMUNITY)
Admission: RE | Admit: 2015-11-11 | Discharge: 2015-11-11 | Disposition: A | Discharge status: Home / Self Care | Payer: PPO | Source: Ambulatory Visit | Attending: Cardiology | Admitting: Cardiology

## 2015-11-11 VITALS — BP 170/100 | HR 71 | Wt 220.0 lb

## 2015-11-11 DIAGNOSIS — I10 Essential (primary) hypertension: Secondary | ICD-10-CM

## 2015-11-11 DIAGNOSIS — Z87891 Personal history of nicotine dependence: Secondary | ICD-10-CM | POA: Diagnosis not present

## 2015-11-11 DIAGNOSIS — N183 Chronic kidney disease, stage 3 unspecified: Secondary | ICD-10-CM

## 2015-11-11 DIAGNOSIS — M2550 Pain in unspecified joint: Secondary | ICD-10-CM | POA: Diagnosis not present

## 2015-11-11 DIAGNOSIS — Z8673 Personal history of transient ischemic attack (TIA), and cerebral infarction without residual deficits: Secondary | ICD-10-CM | POA: Insufficient documentation

## 2015-11-11 DIAGNOSIS — I2510 Atherosclerotic heart disease of native coronary artery without angina pectoris: Secondary | ICD-10-CM | POA: Diagnosis not present

## 2015-11-11 DIAGNOSIS — I5022 Chronic systolic (congestive) heart failure: Secondary | ICD-10-CM | POA: Diagnosis not present

## 2015-11-11 DIAGNOSIS — M545 Low back pain: Secondary | ICD-10-CM | POA: Diagnosis not present

## 2015-11-11 DIAGNOSIS — Z79899 Other long term (current) drug therapy: Secondary | ICD-10-CM | POA: Insufficient documentation

## 2015-11-11 DIAGNOSIS — Z7982 Long term (current) use of aspirin: Secondary | ICD-10-CM | POA: Diagnosis not present

## 2015-11-11 DIAGNOSIS — Z95810 Presence of automatic (implantable) cardiac defibrillator: Secondary | ICD-10-CM | POA: Diagnosis not present

## 2015-11-11 DIAGNOSIS — I130 Hypertensive heart and chronic kidney disease with heart failure and stage 1 through stage 4 chronic kidney disease, or unspecified chronic kidney disease: Secondary | ICD-10-CM | POA: Diagnosis not present

## 2015-11-11 DIAGNOSIS — I428 Other cardiomyopathies: Secondary | ICD-10-CM | POA: Insufficient documentation

## 2015-11-11 DIAGNOSIS — G4733 Obstructive sleep apnea (adult) (pediatric): Secondary | ICD-10-CM | POA: Insufficient documentation

## 2015-11-11 DIAGNOSIS — E669 Obesity, unspecified: Secondary | ICD-10-CM

## 2015-11-11 LAB — BASIC METABOLIC PANEL
ANION GAP: 10 (ref 5–15)
BUN: 25 mg/dL — ABNORMAL HIGH (ref 6–20)
CALCIUM: 9.6 mg/dL (ref 8.9–10.3)
CO2: 23 mmol/L (ref 22–32)
Chloride: 105 mmol/L (ref 101–111)
Creatinine, Ser: 1.43 mg/dL — ABNORMAL HIGH (ref 0.44–1.00)
GFR, EST AFRICAN AMERICAN: 40 mL/min — AB (ref >60)
GFR, EST NON AFRICAN AMERICAN: 35 mL/min — AB (ref >60)
GLUCOSE: 101 mg/dL — AB (ref 65–99)
Potassium: 3.8 mmol/L (ref 3.5–5.1)
Sodium: 138 mmol/L (ref 135–145)

## 2015-11-11 LAB — BRAIN NATRIURETIC PEPTIDE: B NATRIURETIC PEPTIDE 5: 15.2 pg/mL (ref 0.0–100.0)

## 2015-11-11 MED ORDER — FUROSEMIDE 20 MG PO TABS: 20.0000 mg | ORAL_TABLET | Freq: Every day | ORAL | 3 refills | Status: DC

## 2015-11-11 MED ORDER — SACUBITRIL-VALSARTAN 49-51 MG PO TABS: 1.0000 | ORAL_TABLET | Freq: Two times a day (BID) | ORAL | 3 refills | Status: DC

## 2015-11-11 NOTE — Progress Notes (Signed)
Patient ID: Tara Dunn, female   DOB: 12/31/1940, 75 y.o.   MRN: [id number]     Advanced Heart Failure Clinic Note   PCP: Dr. Ernie Hew HF: Dr. Aundra Dubin   75 yo with long history of HTN, CVAs, CKD, and systolic CHF.  She moved to [place] from [place] in 2015.   In 3/16, she was admitted a hypertensive emergency and acute CHF.  CTA chest showed no PE.  Echo showed EF 25-30% with moderate LVH.  She was diuresed and had a cardiac catheterization given extensive coronary calcification seen on CTA chest.  Cath showed diffuse moderate CAD, worst was 50-70% mRCA stenosis.   Cardiac MRI in 12/16 showed EF 32%.  ICD was placed in 2/17.    She presents today for regular follow up.  At last visit was told to take extra lasix.  Couldn't tell any difference in weight.  Still complaining of fatigue, though this has improved now that she is back on CPAP. Denies orthopnea or PND. Denies lightheadedness or dizziness. Walks about 1/2 block without SOB. Not SOB bathing or changing clothes. Occasional bendopnea. Much more limited from joint pain than SOB.   Optivol: Fluid index above threshold, Thoracic impedence down. No AT/AF. Pt activity < 30 minutes a day.   Labs (3/16): K 4.2, creatinine 1.44, HCT 39.7 Labs (06/05/14): K 4.1, creatinine 2.01 Labs (5/16): K 4.7, creatinine 1.56 Labs (7/16): K 4.7, creatinine 1.68, BNP 14, LDL 62 Labs (11/16): K 4.2, creatinine 1.93 Labs (1/17): K 4.6, creatinine 1.65, HCT 39.2 Labs (4/17): K 4.5, creatinine 1.98 Labs (6/17): K 4, creatinine 2 Labs (8/17): K 4.4, creatinine 1.64    Optivol: No AT/AF/VT/VF, Optivol trending up, Minimal patient activity per day. Thoracic impedence down over the past few weeks.   PMH: 1. CVA: 3 prior CVAs, last in 1998.  Thought to be related to HTN. 2. HTN: Long-standing, says she has been hypertensive since age 19. Renal artery dopplers (4/16) were normal.  3. CKD: Suspect related to HTN. 4. Breast cancer: 2009, left mastectomy  with chemotherapy.  5. Gout 6. Obese 7. TAH 8. CAD: LHC (3/16) with moderate diffuse disease, worst lesion being 50-70% mid RCA.  9. Cardiomyopathy: Echo (3/16) with EF 25-30%, moderate LVH.  Echo (6/16) with EF 35-40%, moderate LVH, diffuse hypokinesis with septal-lateral dyssynchrony.  Cardiac MRI (12/16) with EF 32%, diffuse hypokinesis, no contrast given due to CKD.  - Medtronic ICD 2/17.  10. OSA: Using CPAP.  11. Event monitor (1/17) with NSR, occasional PVCs.   SH: From [place], moved to [place] in 2015 to be near daughter.  Single.  Quit smoking 2009.    FH: HTN runs in family.  Father with MI.   ROS: All systems reviewed and negative except as per HPI.   Current Outpatient Prescriptions  Medication Sig Dispense Refill  . allopurinol (ZYLOPRIM) 100 MG tablet Take 200 mg by mouth daily.   0  . aspirin EC 81 MG tablet Take 1 tablet (81 mg total) by mouth daily. 90 tablet 3  . atorvastatin (LIPITOR) 40 MG tablet Take 1 tablet (40 mg total) by mouth daily. 90 tablet 3  . carvedilol (COREG) 25 MG tablet Take 1 tablet (25 mg total) by mouth 2 (two) times daily. 180 tablet 3  . colchicine 0.6 MG tablet Take 0.6 mg by mouth daily as needed (gouty flare).    . furosemide (LASIX) 20 MG tablet Take 1 tablet (20 mg total) by mouth every other day. [place]  tablet   . gabapentin (NEURONTIN) 300 MG capsule Take 300 mg by mouth 3 (three) times daily.    . hydrALAZINE (APRESOLINE) 50 MG tablet Take 1.5 tablets (75 mg total) by mouth 3 (three) times daily. 405 tablet 3  . isosorbide mononitrate (IMDUR) 60 MG 24 hr tablet Take 1.5 tablets (90 mg total) by mouth daily. 270 tablet 3  . naproxen sodium (ANAPROX) 220 MG tablet Take 220 mg by mouth 2 (two) times daily as needed (back pain). Reported on 08/26/2015    . omeprazole (PRILOSEC) 40 MG capsule Take 40 mg by mouth daily.  0  . spironolactone (ALDACTONE) 25 MG tablet Take 1 tablet (25 mg total) by mouth daily. 90 tablet 3  . traMADol (ULTRAM) 50 MG tablet  Take 1 tablet (50 mg total) by mouth every 12 (twelve) hours as needed (twice daily as needed for pain). 60 tablet 0   No current facility-administered medications for this encounter.    BP (!) 170/100 (BP Location: Right Arm, Patient Position: Sitting, Cuff Size: Large)   Pulse 71   Wt 220 lb (99.8 kg)   SpO2 97%   BMI 38.97 kg/m    Wt Readings from Last 3 Encounters:  11/11/15 220 lb (99.8 kg)  10/28/15 218 lb 6.4 oz (99.1 kg)  08/26/15 218 lb (98.9 kg)    General: Elderly appearing HEENT: Normal.  Neck: JVP difficult to assess, appears 11-12 cm with mild HJR. no thyromegaly or thyroid nodule.  Lungs: Mildly diminished basilar sounds.  CV: Nondisplaced PMI.  Heart regular S1/S2, no S3/S4, no murmur. Trace 1+ edema 1/2 way to knee.  No carotid bruit.  Normal pedal pulses.  Abdomen: Soft, NT, ND, no HSM. No bruits or masses. +BS  Skin: Intact without lesions or rashes.  Neurologic: Alert and oriented x 3.  Psych: Normal affect. Extremities: No clubbing or cyanosis.   Assessment/Plan: 1. HTN:  - Elevated, has not had any meds today.  -Has history of CVA and CKD.  Renal artery dopplers were normal.  - Increase Entresto 49/51 bid. BMET today.  - Would avoid NSAID use, can use Tylenol for milder pain and tramadol for more severe pain.   2. CAD: Moderate diffuse CAD, no severe obstruction.  This did not cause her cardiomyopathy.  No chest pain.  - Continue ASA 81 - Continue atorvastatin 40 mg daily.  - Lipids stable Lipids today.  3. CKD III: Check BMET today. As above, avoid NSAID use.  4. 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  EF has been persistently low, 32% by cardiac MRI in 12/16.  Unable to give contrast to assess for scar/infiltrative disease due to CKD.  She had Medtronic ICD placed in 2/17.  NYHA class II-III symptoms, more limited by gout pain currently.   - Weight up despite extra lasix last visit. Volume overloaded by exam and  optivol.  - Take lasix 40 mg x 2 days, then decrease to new chronic dose of 20 mg daily. BMET/BNP today, repeat next week With increase. - Continue current hydralazine/Imdur and spironolactone.   - Increasing Entresto as above.   - Continue Coreg 25 mg bid.   - Follow up with  Cardiac Rehab. Sent referral but she has not heard.  5. Low back and joint pain: Avoid NSAIDs. Use Tylenol or tramadol for severe pain.  6. OSA - Back on CPAP. Sees Dr. Radford Pax 12/10/15  Increase lasix and Entresto as above. Labs today and next week.  Follow up 4 weeks, sooner if not improved. ( Instructed to contact us with worsening symptoms or lack of weight loss or decreased urine output.   Shirley Friar, PA-C 11/11/2015  Total time spent > 25 minutes, over half that spent discussing the above.

## 2015-11-11 NOTE — Patient Instructions (Signed)
INCREASE Entresto to 49/51 mg tablets: Take 1 tablet twice daily.  INCREASE Lasix to 40 mg (2 tabs) once dialy for TWO DAYS, then reduce to 20 mg (1 tab) once EVERY day.  Routine lab work today. Will notify you of abnormal results, otherwise no news is good news!  Return Friday morning 11/21/2015 for repeat labs.  Will refer you to Cardiac Rehab at [facility]. They will call you to set up initial appointment.  Follow up 4 weeks with Oda Kilts, Certified 62 Assistant.  Do the following things EVERYDAY: 1) Weigh yourself in the morning before breakfast. Write it down and keep it in a log. 2) Take your medicines as prescribed 3) Eat low salt foods-Limit salt (sodium) to 2000 mg per day.  4) Stay as active as you can everyday 5) Limit all fluids for the day to less than 2 liters

## 2015-11-11 NOTE — Addendum Note (Signed)
Encounter addended by: Shirley Friar, PA-C on: 11/11/2015  1:47 PM<BR>    Actions taken: Follow-up modified

## 2015-11-12 ENCOUNTER — Telehealth (HOSPITAL_COMMUNITY): Payer: Self-pay

## 2015-11-12 MED ORDER — FUROSEMIDE 20 MG PO TABS: 20.0000 mg | ORAL_TABLET | Freq: Every day | ORAL | 3 refills | Status: DC

## 2015-11-12 MED ORDER — SACUBITRIL-VALSARTAN 49-51 MG PO TABS: 1.0000 | ORAL_TABLET | Freq: Two times a day (BID) | ORAL | 3 refills | Status: DC

## 2015-11-12 NOTE — Telephone Encounter (Signed)
Pharmacy calling CHF clinic triage line to get 90 day refills on lasix and entresto. Both sent electronically for 90 day supply with 3 refills as requested.  Renee Pain, RN

## 2015-11-20 ENCOUNTER — Encounter: Payer: Self-pay | Admitting: Family Medicine

## 2015-11-21 ENCOUNTER — Encounter: Payer: Self-pay | Admitting: Student

## 2015-11-21 ENCOUNTER — Ambulatory Visit (HOSPITAL_COMMUNITY)
Admission: RE | Admit: 2015-11-21 | Discharge: 2015-11-21 | Disposition: A | Discharge status: Home / Self Care | Payer: PPO | Source: Ambulatory Visit | Attending: Cardiology | Admitting: Cardiology

## 2015-11-21 DIAGNOSIS — I5022 Chronic systolic (congestive) heart failure: Secondary | ICD-10-CM | POA: Diagnosis not present

## 2015-11-21 LAB — BASIC METABOLIC PANEL
Anion gap: 10 (ref 5–15)
BUN: 19 mg/dL (ref 6–20)
CO2: 22 mmol/L (ref 22–32)
CREATININE: 1.57 mg/dL — AB (ref 0.44–1.00)
Calcium: 9.8 mg/dL (ref 8.9–10.3)
Chloride: 107 mmol/L (ref 101–111)
GFR calc Af Amer: 36 mL/min — ABNORMAL LOW (ref >60)
GFR, EST NON AFRICAN AMERICAN: 31 mL/min — AB (ref >60)
Glucose, Bld: 95 mg/dL (ref 65–99)
POTASSIUM: 3.8 mmol/L (ref 3.5–5.1)
SODIUM: 139 mmol/L (ref 135–145)

## 2015-11-25 ENCOUNTER — Other Ambulatory Visit (HOSPITAL_COMMUNITY): Payer: Self-pay | Admitting: Pharmacist

## 2015-11-25 MED ORDER — SACUBITRIL-VALSARTAN 49-51 MG PO TABS: 1.0000 | ORAL_TABLET | Freq: Two times a day (BID) | ORAL | 11 refills | Status: DC

## 2015-11-26 ENCOUNTER — Telehealth (HOSPITAL_COMMUNITY): Payer: Self-pay | Admitting: Pharmacist

## 2015-11-26 NOTE — Telephone Encounter (Signed)
Patient cannot get Entresto through [employer] order because they will not accept [employer] and it would be $170/30 day supply. Spoke with [facility] who verified $0 copay for Entresto 49-51 mg BID. Patient aware and will pick up tomorrow.   [name]. [name], PharmD, BCPS, CPP Clinical Pharmacist Pager: [phone] Phone: [phone] 11/26/2015 10:49 AM

## 2015-11-27 ENCOUNTER — Encounter: Payer: Self-pay | Admitting: Cardiology

## 2015-12-09 ENCOUNTER — Ambulatory Visit (HOSPITAL_COMMUNITY)
Admission: RE | Admit: 2015-12-09 | Discharge: 2015-12-09 | Disposition: A | Discharge status: Home / Self Care | Payer: PPO | Source: Ambulatory Visit | Attending: Cardiology | Admitting: Cardiology

## 2015-12-09 ENCOUNTER — Encounter (HOSPITAL_COMMUNITY): Payer: Self-pay

## 2015-12-09 VITALS — BP 82/54 | HR 84 | Wt 217.4 lb

## 2015-12-09 DIAGNOSIS — Z853 Personal history of malignant neoplasm of breast: Secondary | ICD-10-CM | POA: Diagnosis not present

## 2015-12-09 DIAGNOSIS — I429 Cardiomyopathy, unspecified: Secondary | ICD-10-CM | POA: Insufficient documentation

## 2015-12-09 DIAGNOSIS — I2510 Atherosclerotic heart disease of native coronary artery without angina pectoris: Secondary | ICD-10-CM | POA: Diagnosis not present

## 2015-12-09 DIAGNOSIS — Z87891 Personal history of nicotine dependence: Secondary | ICD-10-CM | POA: Insufficient documentation

## 2015-12-09 DIAGNOSIS — Z95810 Presence of automatic (implantable) cardiac defibrillator: Secondary | ICD-10-CM | POA: Diagnosis not present

## 2015-12-09 DIAGNOSIS — Z8249 Family history of ischemic heart disease and other diseases of the circulatory system: Secondary | ICD-10-CM | POA: Diagnosis not present

## 2015-12-09 DIAGNOSIS — Z6838 Body mass index (BMI) 38.0-38.9, adult: Secondary | ICD-10-CM | POA: Insufficient documentation

## 2015-12-09 DIAGNOSIS — I5022 Chronic systolic (congestive) heart failure: Secondary | ICD-10-CM

## 2015-12-09 DIAGNOSIS — E669 Obesity, unspecified: Secondary | ICD-10-CM | POA: Diagnosis not present

## 2015-12-09 DIAGNOSIS — M2550 Pain in unspecified joint: Secondary | ICD-10-CM | POA: Insufficient documentation

## 2015-12-09 DIAGNOSIS — G4733 Obstructive sleep apnea (adult) (pediatric): Secondary | ICD-10-CM

## 2015-12-09 DIAGNOSIS — I130 Hypertensive heart and chronic kidney disease with heart failure and stage 1 through stage 4 chronic kidney disease, or unspecified chronic kidney disease: Secondary | ICD-10-CM | POA: Diagnosis not present

## 2015-12-09 DIAGNOSIS — N183 Chronic kidney disease, stage 3 unspecified: Secondary | ICD-10-CM

## 2015-12-09 DIAGNOSIS — I10 Essential (primary) hypertension: Secondary | ICD-10-CM | POA: Diagnosis not present

## 2015-12-09 DIAGNOSIS — Z9221 Personal history of antineoplastic chemotherapy: Secondary | ICD-10-CM | POA: Diagnosis not present

## 2015-12-09 DIAGNOSIS — M109 Gout, unspecified: Secondary | ICD-10-CM | POA: Diagnosis not present

## 2015-12-09 DIAGNOSIS — Z7982 Long term (current) use of aspirin: Secondary | ICD-10-CM | POA: Insufficient documentation

## 2015-12-09 DIAGNOSIS — Z8673 Personal history of transient ischemic attack (TIA), and cerebral infarction without residual deficits: Secondary | ICD-10-CM | POA: Diagnosis not present

## 2015-12-09 LAB — BASIC METABOLIC PANEL
Anion gap: 5 (ref 5–15)
BUN: 23 mg/dL — AB (ref 6–20)
CALCIUM: 9.6 mg/dL (ref 8.9–10.3)
CO2: 26 mmol/L (ref 22–32)
CREATININE: 1.52 mg/dL — AB (ref 0.44–1.00)
Chloride: 107 mmol/L (ref 101–111)
GFR, EST AFRICAN AMERICAN: 38 mL/min — AB (ref >60)
GFR, EST NON AFRICAN AMERICAN: 32 mL/min — AB (ref >60)
Glucose, Bld: 108 mg/dL — ABNORMAL HIGH (ref 65–99)
Potassium: 3.9 mmol/L (ref 3.5–5.1)
SODIUM: 138 mmol/L (ref 135–145)

## 2015-12-09 LAB — BRAIN NATRIURETIC PEPTIDE: B NATRIURETIC PEPTIDE 5: 15.6 pg/mL (ref 0.0–100.0)

## 2015-12-09 MED ORDER — HYDRALAZINE HCL 50 MG PO TABS: 50.0000 mg | ORAL_TABLET | Freq: Three times a day (TID) | ORAL | 3 refills | Status: DC

## 2015-12-09 NOTE — Progress Notes (Signed)
Advanced Heart Failure Medication Review by a Pharmacist  Does the patient  feel that his/her medications are working for him/her?  yes  Has the patient been experiencing any side effects to the medications prescribed?  no  Does the patient measure his/her own blood pressure or blood glucose at home?  yes   Does the patient have any problems obtaining medications due to transportation or finances?   no  Understanding of regimen: good Understanding of indications: good Potential of compliance: good Patient understands to avoid NSAIDs. Patient understands to avoid decongestants.  Issues to address at subsequent visits: None   Pharmacist comments:  Tara Dunn is a pleasant 75 yo F presenting without a medication list but with good recall of her regimen. She reports good compliance with her regimen and did not have any specific medication-related questions or concerns for me at this time.   [name]. [name], PharmD, BCPS, CPP Clinical Pharmacist Pager: [phone] Phone: [phone] 12/09/2015 12:01 PM      Time with patient: 10 minutes Preparation and documentation time: 2 minutes Total time: 12 minutes

## 2015-12-09 NOTE — Patient Instructions (Signed)
DECREASE Hydralazine to 50 mg (one tab) three times per day  Labs today We will only contact you if something comes back abnormal or we need to make some changes. Otherwise no news is good news!  Your physician recommends that you schedule a follow-up appointment in: 2-3 months with Dr Aundra Dubin  Do the following things EVERYDAY: 1) Weigh yourself in the morning before breakfast. Write it down and keep it in a log. 2) Take your medicines as prescribed 3) Eat low salt foods-Limit salt (sodium) to 2000 mg per day.  4) Stay as active as you can everyday 5) Limit all fluids for the day to less than 2 liters

## 2015-12-09 NOTE — Progress Notes (Signed)
Patient ID: Tara Dunn, female   DOB: 01-07-1941, 75 y.o.   MRN: [id number]     Advanced Heart Failure Clinic Note   PCP: Dr. Ernie Hew HF: Dr. Aundra Dubin   75 yo with long history of HTN, CVAs, CKD, and systolic CHF.  She moved to [place] from [place] in 2015.   In 3/16, she was admitted a hypertensive emergency and acute CHF.  CTA chest showed no PE.  Echo showed EF 25-30% with moderate LVH.  She was diuresed and had a cardiac catheterization given extensive coronary calcification seen on CTA chest.  Cath showed diffuse moderate CAD, worst was 50-70% mRCA stenosis.   Cardiac MRI in 12/16 showed EF 32%.  ICD was placed in 2/17.    She presents today for regular follow up.  At last visit Entresto increased with SBP in 170s.  Today in clinic BP soft in 80-90s. Energy level has improved. Having joint pain. She occasionally has SOB with mild exertion. Normally she does not have SOB with ADLs such as changing clothes or bathing. Denies orthopnea or PND. No lightheadedness or dizziness. Can walk about 1/2 block at a time without DOE.   Mostly limited from joint pain.   Labs (3/16): K 4.2, creatinine 1.44, HCT 39.7 Labs (06/05/14): K 4.1, creatinine 2.01 Labs (5/16): K 4.7, creatinine 1.56 Labs (7/16): K 4.7, creatinine 1.68, BNP 14, LDL 62 Labs (11/16): K 4.2, creatinine 1.93 Labs (1/17): K 4.6, creatinine 1.65, HCT 39.2 Labs (4/17): K 4.5, creatinine 1.98 Labs (6/17): K 4, creatinine 2 Labs (8/17): K 4.4, creatinine 1.64    PMH: 1. CVA: 3 prior CVAs, last in 1998.  Thought to be related to HTN. 2. HTN: Long-standing, says she has been hypertensive since age 76. Renal artery dopplers (4/16) were normal.  3. CKD: Suspect related to HTN. 4. Breast cancer: 2009, left mastectomy with chemotherapy.  5. Gout 6. Obese 7. TAH 8. CAD: LHC (3/16) with moderate diffuse disease, worst lesion being 50-70% mid RCA.  9. Cardiomyopathy: Echo (3/16) with EF 25-30%, moderate LVH.  Echo (6/16) with EF  35-40%, moderate LVH, diffuse hypokinesis with septal-lateral dyssynchrony.  Cardiac MRI (12/16) with EF 32%, diffuse hypokinesis, no contrast given due to CKD.  - Medtronic ICD 2/17.  10. OSA: Using CPAP.  11. Event monitor (1/17) with NSR, occasional PVCs.   SH: From [place], moved to [place] in 2015 to be near daughter.  Single.  Quit smoking 2009.    FH: HTN runs in family.  Father with MI.   ROS: All systems reviewed and negative except as per HPI.   Current Outpatient Prescriptions  Medication Sig Dispense Refill  . allopurinol (ZYLOPRIM) 100 MG tablet Take 200 mg by mouth daily.   0  . aspirin EC 81 MG tablet Take 1 tablet (81 mg total) by mouth daily. 90 tablet 3  . atorvastatin (LIPITOR) 40 MG tablet Take 1 tablet (40 mg total) by mouth daily. 90 tablet 3  . carvedilol (COREG) 25 MG tablet Take 1 tablet (25 mg total) by mouth 2 (two) times daily. 180 tablet 3  . colchicine 0.6 MG tablet Take 0.6 mg by mouth daily as needed (gouty flare).    . furosemide (LASIX) 20 MG tablet Take 1 tablet (20 mg total) by mouth daily. 90 tablet 3  . gabapentin (NEURONTIN) 300 MG capsule Take 300 mg by mouth 3 (three) times daily.    . hydrALAZINE (APRESOLINE) 50 MG tablet Take 1.5 tablets (75 mg total) by  mouth 3 (three) times daily. 405 tablet 3  . isosorbide mononitrate (IMDUR) 60 MG 24 hr tablet Take 1.5 tablets (90 mg total) by mouth daily. 270 tablet 3  . naproxen sodium (ANAPROX) 220 MG tablet Take 220 mg by mouth daily as needed (back pain). Reported on 08/26/2015    . omeprazole (PRILOSEC) 40 MG capsule Take 40 mg by mouth daily.  0  . sacubitril-valsartan (ENTRESTO) 49-51 MG Take 1 tablet by mouth 2 (two) times daily. 60 tablet 11  . spironolactone (ALDACTONE) 25 MG tablet Take 1 tablet (25 mg total) by mouth daily. 90 tablet 3  . traMADol (ULTRAM) 50 MG tablet Take 1 tablet (50 mg total) by mouth every 12 (twelve) hours as needed (twice daily as needed for pain). 60 tablet 0   No current  facility-administered medications for this encounter.    BP (!) 82/54 (BP Location: Right Arm, Patient Position: Sitting, Cuff Size: Large)   Pulse 84   Wt 217 lb 6.4 oz (98.6 kg)   SpO2 95%   BMI 38.51 kg/m    Wt Readings from Last 3 Encounters:  12/09/15 217 lb 6.4 oz (98.6 kg)  11/11/15 220 lb (99.8 kg)  10/28/15 218 lb 6.4 oz (99.1 kg)    General: Elderly appearing HEENT: Normal.  Neck: JVP appears 7-8 cm.  no thyromegaly or thyroid nodule.  Lungs: Clear, normal effort.  CV: Nondisplaced PMI.  Heart regular S1/S2, no S3/S4, no murmur. No peripheral edema. No carotid bruit.  Normal pedal pulses.  Abdomen: Soft, NT, ND, no HSM. No bruits or masses. +BS  Skin: Intact without lesions or rashes.  Neurologic: Alert and oriented x 3.  Psych: Normal affect. Extremities: No clubbing or cyanosis.   Assessment/Plan: 1. HTN:  - Low today, after increase of Entresto at last visit.  - Decrease hydralazine to 50 mg TID to leave room for Entresto.  Denies lightheadedness or dizziness. - Has history of CVA and CKD.  Renal artery dopplers were normal.  - Continue Entresto 49/51 bid. Check BMET/BNP today.  - Would avoid NSAID use, can use Tylenol for milder pain and tramadol for more severe pain.   2. CAD: Moderate diffuse CAD, no severe obstruction.  This did not cause her cardiomyopathy.  No chest pain.  - Continue ASA 81 - Continue atorvastatin 40 mg daily.  - Recent Lipids stable.   3. CKD III:  - BMET today.  - Avoid NSAID use.   4. 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  EF has been persistently low, 32% by cardiac MRI in 12/16.  Unable to give contrast to assess for scar/infiltrative disease due to CKD.  She had Medtronic ICD placed in 2/17.  NYHA class II-III symptoms, more limited by gout pain currently.   - Volume status OK on exam.  Will not uptitrate meds with soft pressures.  - Continue lasix 20 mg daily.  - Decrease hydralazine to 50 mg TID.   - Continue imdur 90 mg daily - Continue spironolactone 25 mg daily for now.  - Continue Entresto at current dose as above. No room to up-titrate with soft pressures.  - Continue Coreg 25 mg bid.   5. Low back and joint pain: Avoid NSAIDs. Use Tylenol or tramadol for severe pain.  6. OSA - Back on CPAP. Sees Dr. Radford Pax tomorrow.   Pressures soft in clinic. Will back down on hydralazine to leave room for her other medications with more benefit.  She denies  any dizziness/lightheadedness. Volume status mostly OK, though weight still elevated from previous. Continue current lasix for now.  Suspect she is getting most anti-hypertensive benefit from increased dose of Entresto. Will plan repeat labwork prn based on labs today.   Follow up 2 months with Dr. Aundra Dubin. Pt knows to call sooner with any worsening SOB, lightheadedness, or dizziness.   Shirley Friar, PA-C 12/09/2015  Total time spent 25 minutes, over half that spent discussing the above.

## 2015-12-10 ENCOUNTER — Encounter: Payer: Self-pay | Admitting: *Deleted

## 2015-12-10 ENCOUNTER — Encounter: Payer: Self-pay | Admitting: Cardiology

## 2015-12-10 ENCOUNTER — Ambulatory Visit (INDEPENDENT_AMBULATORY_CARE_PROVIDER_SITE_OTHER): Payer: PPO | Admitting: Cardiology

## 2015-12-10 ENCOUNTER — Encounter (INDEPENDENT_AMBULATORY_CARE_PROVIDER_SITE_OTHER): Payer: PPO

## 2015-12-10 VITALS — BP 152/118 | HR 64 | Ht 63.0 in | Wt 218.0 lb

## 2015-12-10 DIAGNOSIS — E669 Obesity, unspecified: Secondary | ICD-10-CM

## 2015-12-10 DIAGNOSIS — G4733 Obstructive sleep apnea (adult) (pediatric): Secondary | ICD-10-CM

## 2015-12-10 DIAGNOSIS — I10 Essential (primary) hypertension: Secondary | ICD-10-CM

## 2015-12-10 NOTE — Progress Notes (Signed)
Cardiology Office Note    Date:  12/10/2015   ID:  [name], DOB 06/05/1940, MRN [id number]  PCP:  Rachell Cipro, MD  Cardiologist:  Fransico Him, MD   Chief Complaint  Patient presents with  . Sleep Apnea  . Hypertension    History of Present Illness:  Tara Dunn is a 75 y.o. female who presents for followup of OSA.  She was referred by Dr. Aundra Dubin for snoring, nonrestorative sleep and excessive daytime sleepiness. She has mild OSA with an AHI of 12.4/hr but during REM sleep the OSA was severe with an AHI of 34/hr.  He is on CPAP at 9cm H2O.  She is doing well with her device.  She tolerates the nasal pillow mask and feels the pressure is adequate.  Since going on the CPAP she feels more rested in the am and has no daytime sleepiness.  She has some mouth dryness that is not bothersome and no nasal congestion.   Past Medical History:  Diagnosis Date  . 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 Lovena Le  . Aortic dilatation ([place])    a. 04/2014 CTA chest w/ incidental finding of distal thoracic Ao enlargement of 3.39 mm - f/u needed 04/2015.  . Arthritis    "all over my body"  . Cancer of left breast ([place]) 2009   s/p L mastectomy and chemo.  . Cardiomyopathy ([place])    a. 04/2014 Echo: EF 25-30%, mod conc LVH, possibl antsept HK, Gr1 DD, mod-sev dil LA.  . CHF (congestive heart failure) ([place])   . CKD (chronic kidney disease), stage III   . Depression   . GERD (gastroesophageal reflux disease)   . Gout   . Heart murmur   . History of stomach ulcers   . Hypertension    a. Dx @ age 39;  b. 04/2014 admission for HTN emergency.  . Obesity (BMI 30-39.9) 01/21/2015  . OSA on CPAP   . Stroke [facility])    a. 3 strokes - last in 1998; "memory problems since"  (03/26/2015)    Past Surgical History:  Procedure Laterality Date  . ABDOMINAL HYSTERECTOMY    . BREAST BIOPSY Left 2008  . CATARACT EXTRACTION, BILATERAL Bilateral   . DILATION AND  CURETTAGE OF UTERUS    . EP IMPLANTABLE DEVICE N/A 03/26/2015   MDT single chamber ICD, Dr. Lovena Le  . LEFT HEART CATHETERIZATION WITH CORONARY ANGIOGRAM N/A 05/07/2014   Procedure: LEFT HEART CATHETERIZATION WITH CORONARY ANGIOGRAM;  Surgeon: Belva Crome, MD;  Location[facility] C[facility] LAB;  Service: Cardiovascular;  Laterality: N/A;  . MASTECTOMY Left 2009    Current Medications: Outpatient Medications Prior to Visit  Medication Sig Dispense Refill  . allopurinol (ZYLOPRIM) 100 MG tablet Take 200 mg by mouth daily.   0  . aspirin EC 81 MG tablet Take 1 tablet (81 mg total) by mouth daily. 90 tablet 3  . atorvastatin (LIPITOR) 40 MG tablet Take 1 tablet (40 mg total) by mouth daily. 90 tablet 3  . carvedilol (COREG) 25 MG tablet Take 1 tablet (25 mg total) by mouth 2 (two) times daily. 180 tablet 3  . colchicine 0.6 MG tablet Take 0.6 mg by mouth daily as needed (gouty flare).    . furosemide (LASIX) 20 MG tablet Take 1 tablet (20 mg total) by mouth daily. 90 tablet 3  . gabapentin (NEURONTIN) 300 MG capsule Take 300 mg by mouth 3 (three) times daily.    . hydrALAZINE (APRESOLINE) 50  MG tablet Take 1 tablet (50 mg total) by mouth 3 (three) times daily. 405 tablet 3  . isosorbide mononitrate (IMDUR) 60 MG 24 hr tablet Take 1.5 tablets (90 mg total) by mouth daily. 270 tablet 3  . naproxen sodium (ANAPROX) 220 MG tablet Take 220 mg by mouth daily as needed (back pain). Reported on 08/26/2015    . omeprazole (PRILOSEC) 40 MG capsule Take 40 mg by mouth daily.  0  . sacubitril-valsartan (ENTRESTO) 49-51 MG Take 1 tablet by mouth 2 (two) times daily. 60 tablet 11  . spironolactone (ALDACTONE) 25 MG tablet Take 1 tablet (25 mg total) by mouth daily. 90 tablet 3  . traMADol (ULTRAM) 50 MG tablet Take 1 tablet (50 mg total) by mouth every 12 (twelve) hours as needed (twice daily as needed for pain). 60 tablet 0   No facility-administered medications prior to visit.      Allergies:   Shellfish allergy    Social History   Social History  . Marital status: Widowed    Spouse name: N/A  . Number of children: N/A  . Years of education: N/A   Social History Main Topics  . Smoking status: Former Smoker    Packs/day: 0.50    Years: 40.00    Types: Cigarettes  . Smokeless tobacco: Never Used     Comment: Quit in 2009.  Marland Kitchen Alcohol use No     Comment: "drank occasionally when I was young; last drink was in the late 1990s"  . Drug use: No  . Sexual activity: No   Other Topics Concern  . None   Social History Narrative   Lives alone in an independent living facility.  Education: 10th grade.     Retired from [employer] work.  Moved here from [place] in 2015.     Family History:  The patient's family history includes Cancer in her daughter and sister; Heart attack in her father; Heart disease in her daughter; High blood pressure in her father, mother, and sister.   ROS:   Please see the history of present illness.    ROS All other systems reviewed and are negative.  No flowsheet data found.     PHYSICAL EXAM:   VS:  BP (!) 152/118   Pulse 64   Ht 5\' 3"  (1.6 m)   Wt 218 lb (98.9 kg)   BMI 38.62 kg/m    GEN: Well nourished, well developed, in no acute distress  HEENT: normal  Neck: no JVD, carotid bruits, or masses Cardiac: RRR; no murmurs, rubs, or gallops,no edema.  Intact distal pulses bilaterally.  Respiratory:  clear to auscultation bilaterally, normal work of breathing GI: soft, nontender, nondistended, + BS MS: no deformity or atrophy  Skin: warm and dry, no rash Neuro:  Alert and Oriented x 3, Strength and sensation are intact Psych: euthymic mood, full affect  Wt Readings from Last 3 Encounters:  12/10/15 218 lb (98.9 kg)  12/09/15 217 lb 6.4 oz (98.6 kg)  11/11/15 220 lb (99.8 kg)      Studies/Labs Reviewed:   EKG:  EKG is not ordered today.    Recent Labs: 03/11/2015: Hemoglobin 12.9; Platelets 196 12/09/2015: B Natriuretic Peptide 15.6; BUN 23; Creatinine, Ser  1.52; Potassium 3.9; Sodium 138   Lipid Panel    Component Value Date/Time   CHOL 123 10/28/2015 1000   TRIG 79 10/28/2015 1000   HDL 50 10/28/2015 1000   CHOLHDL 2.5 10/28/2015 1000   VLDL 16 10/28/2015 1000  [place] 57 10/28/2015 1000    Additional studies/ records that were reviewed today include:  CPAP download    ASSESSMENT:    1. OSA (obstructive sleep apnea)   2. Essential hypertension   3. Obesity (BMI 30-39.9)      PLAN:  In order of problems listed above:  OSA - the patient is tolerating PAP therapy well without any problems. The PAP download was reviewed today and showed an AHI of 5.1/hr on 9 cm H2O with 70% compliance in using more than 4 hours nightly.  The patient has been using and benefiting from CPAP use and will continue to benefit from therapy.  HTN - BP poorly controlled on current meds. Repeat BP was.  She says that her BP yesterday at her PCP office was 89/94mmHg and her meds were adjusted.  I will get a 24 hour BP monitor to assess further and make sure she doesn't have white coat HTN.  Continue BB/hydralazine/diuretic and ARB Obesity - I have encouraged and cut back on carbs and portions. She is limited on exercise due to her knee problems.      Medication Adjustments/Labs and Tests Ordered: Current medicines are reviewed at length with the patient today.  Concerns regarding medicines are outlined above.  Medication changes, Labs and Tests ordered today are listed in the Patient Instructions below.  Patient Instructions  Medication Instructions:  Your physician recommends that you continue on your current medications as directed. Please refer to the Current Medication list given to you today.   Labwork: None  Testing/Procedures: Dr. Radford Pax recommends you have a 24 hour blood pressure monitor.  Follow-Up: Your physician wants you to follow-up in: 1 year with Dr. Radford Pax. You will receive a reminder letter in the mail two months in advance. If  you don't receive a letter, please call our office to schedule the follow-up appointment.   Any Other Special Instructions Will Be Listed Below (If Applicable).     If you need a refill on your cardiac medications before your next appointment, please call your pharmacy.      Signed, Fransico Him, MD  12/10/2015 10:38 AM    [facility] [place], [place], [place]  [address] Phone: [phone]; Fax: [phone]

## 2015-12-10 NOTE — Progress Notes (Signed)
Patient ID: Tara Dunn, female   DOB: 1940-11-01, 75 y.o.   MRN: [id number]  24 hour ambulatory blood pressure monitor applied to patient using adult large cuff.

## 2015-12-10 NOTE — Patient Instructions (Signed)
Medication Instructions:  Your physician recommends that you continue on your current medications as directed. Please refer to the Current Medication list given to you today.   Labwork: None  Testing/Procedures: Dr. Radford Pax recommends you have a 24 hour blood pressure monitor.  Follow-Up: Your physician wants you to follow-up in: 1 year with Dr. Radford Pax. You will receive a reminder letter in the mail two months in advance. If you don't receive a letter, please call our office to schedule the follow-up appointment.   Any Other Special Instructions Will Be Listed Below (If Applicable).     If you need a refill on your cardiac medications before your next appointment, please call your pharmacy.

## 2015-12-15 ENCOUNTER — Encounter: Payer: Self-pay | Admitting: Cardiology

## 2015-12-25 ENCOUNTER — Telehealth: Payer: Self-pay

## 2015-12-25 MED ORDER — AMLODIPINE BESYLATE 2.5 MG PO TABS
2.5000 mg | ORAL_TABLET | Freq: Every day | ORAL | 11 refills | Status: DC
Start: 2015-12-25 — End: 2016-02-10

## 2015-12-25 NOTE — Telephone Encounter (Signed)
Per Dr. Radford Pax, 24 hour blood pressure monitor showed "BP too high. Add amlodipine 2.5 mg daily and repeat 24 hour BP cuff in one week"  Average BP= 149/79  Instructed patient to START AMLODIPINE 2.5 mg daily. Patient is leaving for out of town for a while. She states she will not be here for BP monitor that soon. She also reports she is getting a BP cuff to have at home. She understands to call when she gets back from vacation to either schedule BP cuff (if she does not purchase machine) or to report a list of daily BP readings.  She was grateful for call.

## 2016-01-08 NOTE — Telephone Encounter (Signed)
Called patient to follow-up. Confirmed with patient she started amlodipine 2.5 mg daily and is tolerating well. She reports she does not want to complete another ambulatory BP monitor, but she did order a new BP machine. When it arrives, she will check her BP 2 hours after she takes her medications for a week and call with results. She was grateful for call.

## 2016-01-21 DIAGNOSIS — I10 Essential (primary) hypertension: Secondary | ICD-10-CM | POA: Diagnosis not present

## 2016-01-21 DIAGNOSIS — M1000 Idiopathic gout, unspecified site: Secondary | ICD-10-CM | POA: Diagnosis not present

## 2016-01-23 ENCOUNTER — Other Ambulatory Visit: Payer: Self-pay | Admitting: Family Medicine

## 2016-01-23 DIAGNOSIS — C50912 Malignant neoplasm of unspecified site of left female breast: Secondary | ICD-10-CM | POA: Diagnosis not present

## 2016-01-23 DIAGNOSIS — N19 Unspecified kidney failure: Secondary | ICD-10-CM | POA: Diagnosis not present

## 2016-01-23 DIAGNOSIS — I10 Essential (primary) hypertension: Secondary | ICD-10-CM | POA: Diagnosis not present

## 2016-01-23 DIAGNOSIS — Z853 Personal history of malignant neoplasm of breast: Secondary | ICD-10-CM

## 2016-01-23 DIAGNOSIS — M1000 Idiopathic gout, unspecified site: Secondary | ICD-10-CM | POA: Diagnosis not present

## 2016-02-05 ENCOUNTER — Other Ambulatory Visit: Payer: Self-pay | Admitting: Family Medicine

## 2016-02-05 ENCOUNTER — Ambulatory Visit
Admission: RE | Admit: 2016-02-05 | Discharge: 2016-02-05 | Disposition: A | Discharge status: Home / Self Care | Payer: PPO | Source: Ambulatory Visit | Attending: Family Medicine | Admitting: Family Medicine

## 2016-02-05 DIAGNOSIS — Z1231 Encounter for screening mammogram for malignant neoplasm of breast: Secondary | ICD-10-CM | POA: Diagnosis not present

## 2016-02-05 DIAGNOSIS — Z853 Personal history of malignant neoplasm of breast: Secondary | ICD-10-CM

## 2016-02-10 ENCOUNTER — Encounter (HOSPITAL_COMMUNITY): Payer: Self-pay

## 2016-02-10 ENCOUNTER — Ambulatory Visit (HOSPITAL_COMMUNITY)
Admission: RE | Admit: 2016-02-10 | Discharge: 2016-02-10 | Disposition: A | Discharge status: Home / Self Care | Payer: PPO | Source: Ambulatory Visit | Attending: Internal Medicine | Admitting: Internal Medicine

## 2016-02-10 ENCOUNTER — Encounter: Payer: Self-pay | Admitting: Internal Medicine

## 2016-02-10 ENCOUNTER — Other Ambulatory Visit: Payer: Self-pay | Admitting: Family Medicine

## 2016-02-10 VITALS — BP 116/64 | HR 76 | Wt 220.8 lb

## 2016-02-10 DIAGNOSIS — Z95810 Presence of automatic (implantable) cardiac defibrillator: Secondary | ICD-10-CM | POA: Insufficient documentation

## 2016-02-10 DIAGNOSIS — I429 Cardiomyopathy, unspecified: Secondary | ICD-10-CM | POA: Insufficient documentation

## 2016-02-10 DIAGNOSIS — G4733 Obstructive sleep apnea (adult) (pediatric): Secondary | ICD-10-CM | POA: Insufficient documentation

## 2016-02-10 DIAGNOSIS — Z9012 Acquired absence of left breast and nipple: Secondary | ICD-10-CM | POA: Insufficient documentation

## 2016-02-10 DIAGNOSIS — N183 Chronic kidney disease, stage 3 (moderate): Secondary | ICD-10-CM | POA: Insufficient documentation

## 2016-02-10 DIAGNOSIS — M109 Gout, unspecified: Secondary | ICD-10-CM | POA: Insufficient documentation

## 2016-02-10 DIAGNOSIS — Z9221 Personal history of antineoplastic chemotherapy: Secondary | ICD-10-CM | POA: Insufficient documentation

## 2016-02-10 DIAGNOSIS — I130 Hypertensive heart and chronic kidney disease with heart failure and stage 1 through stage 4 chronic kidney disease, or unspecified chronic kidney disease: Secondary | ICD-10-CM | POA: Diagnosis not present

## 2016-02-10 DIAGNOSIS — I2510 Atherosclerotic heart disease of native coronary artery without angina pectoris: Secondary | ICD-10-CM | POA: Diagnosis not present

## 2016-02-10 DIAGNOSIS — I10 Essential (primary) hypertension: Secondary | ICD-10-CM | POA: Diagnosis not present

## 2016-02-10 DIAGNOSIS — Z87891 Personal history of nicotine dependence: Secondary | ICD-10-CM | POA: Diagnosis not present

## 2016-02-10 DIAGNOSIS — R928 Other abnormal and inconclusive findings on diagnostic imaging of breast: Secondary | ICD-10-CM

## 2016-02-10 DIAGNOSIS — Z8673 Personal history of transient ischemic attack (TIA), and cerebral infarction without residual deficits: Secondary | ICD-10-CM | POA: Diagnosis not present

## 2016-02-10 DIAGNOSIS — Z7982 Long term (current) use of aspirin: Secondary | ICD-10-CM | POA: Diagnosis not present

## 2016-02-10 DIAGNOSIS — E669 Obesity, unspecified: Secondary | ICD-10-CM | POA: Diagnosis not present

## 2016-02-10 DIAGNOSIS — Z8249 Family history of ischemic heart disease and other diseases of the circulatory system: Secondary | ICD-10-CM | POA: Insufficient documentation

## 2016-02-10 DIAGNOSIS — Z6839 Body mass index (BMI) 39.0-39.9, adult: Secondary | ICD-10-CM | POA: Insufficient documentation

## 2016-02-10 DIAGNOSIS — Z853 Personal history of malignant neoplasm of breast: Secondary | ICD-10-CM | POA: Insufficient documentation

## 2016-02-10 DIAGNOSIS — I5022 Chronic systolic (congestive) heart failure: Secondary | ICD-10-CM | POA: Diagnosis not present

## 2016-02-10 LAB — BASIC METABOLIC PANEL
ANION GAP: 8 (ref 5–15)
BUN: 29 mg/dL — AB (ref 6–20)
CHLORIDE: 107 mmol/L (ref 101–111)
CO2: 20 mmol/L — ABNORMAL LOW (ref 22–32)
Calcium: 9.9 mg/dL (ref 8.9–10.3)
Creatinine, Ser: 1.59 mg/dL — ABNORMAL HIGH (ref 0.44–1.00)
GFR calc Af Amer: 35 mL/min — ABNORMAL LOW (ref >60)
GFR, EST NON AFRICAN AMERICAN: 30 mL/min — AB (ref >60)
Glucose, Bld: 114 mg/dL — ABNORMAL HIGH (ref 65–99)
POTASSIUM: 4.3 mmol/L (ref 3.5–5.1)
SODIUM: 135 mmol/L (ref 135–145)

## 2016-02-10 MED ORDER — HYDRALAZINE HCL 50 MG PO TABS: 75.0000 mg | ORAL_TABLET | Freq: Three times a day (TID) | ORAL | 3 refills | Status: DC

## 2016-02-10 NOTE — Progress Notes (Signed)
Patient ID: Tara Dunn, female   DOB: February 11, 1940, 76 y.o.   MRN: [id number]     Advanced Heart Failure Clinic Note   PCP: Dr. Ernie Hew HF: Dr. Aundra Dubin   76 yo with long history of HTN, CVAs, CKD, and systolic CHF.  She moved to [place] from [place] in 2015.   In 3/16, she was admitted a hypertensive emergency and acute CHF.  CTA chest showed no PE.  Echo showed EF 25-30% with moderate LVH.  She was diuresed and had a cardiac catheterization given extensive coronary calcification seen on CTA chest.  Cath showed diffuse moderate CAD, worst was 50-70% mRCA stenosis.   Cardiac MRI in 12/16 showed EF 32%.  ICD was placed in 2/17.    She presents today for regular followup.  She is limited by arthritis, worse with cold weather.  She is short of breath with moderate exertion but does not get dyspnea walking at a normal pace.  No orthopnea/PND.  No chest pain.  At last appointment, hydralazine was cut back due to low BP.   BP higher today, no lightheadedness/dizziness.   Optivol: Fluid index < threshold, impedance stable.   Labs (3/16): K 4.2, creatinine 1.44, HCT 39.7 Labs (06/05/14): K 4.1, creatinine 2.01 Labs (5/16): K 4.7, creatinine 1.56 Labs (7/16): K 4.7, creatinine 1.68, BNP 14, LDL 62 Labs (11/16): K 4.2, creatinine 1.93 Labs (1/17): K 4.6, creatinine 1.65, HCT 39.2 Labs (4/17): K 4.5, creatinine 1.98 Labs (6/17): K 4, creatinine 2 Labs (8/17): K 4.4, creatinine 1.64   Labs (10/17): K 3.9, creatinine 1.5, BNP 15  PMH: 1. CVA: 3 prior CVAs, last in 1998.  Thought to be related to HTN. 2. HTN: Long-standing, says she has been hypertensive since age 76. Renal artery dopplers (4/16) were normal.  3. CKD: Suspect related to HTN. 4. Breast cancer: 2009, left mastectomy with chemotherapy.  5. Gout 6. Obese 7. TAH 8. CAD: LHC (3/16) with moderate diffuse disease, worst lesion being 50-70% mid RCA.  9. Cardiomyopathy: Echo (3/16) with EF 25-30%, moderate LVH.  Echo (6/16) with EF  35-40%, moderate LVH, diffuse hypokinesis with septal-lateral dyssynchrony.  Cardiac MRI (12/16) with EF 32%, diffuse hypokinesis, no contrast given due to CKD.  - Medtronic ICD 2/17.  10. OSA: Using CPAP.  11. Event monitor (1/17) with NSR, occasional PVCs.   SH: From [place], moved to [place] in 2015 to be near daughter.  Single.  Quit smoking 2009.    FH: HTN runs in family.  Father with MI.   ROS: All systems reviewed and negative except as per HPI.   Current Outpatient Prescriptions  Medication Sig Dispense Refill  . allopurinol (ZYLOPRIM) 100 MG tablet Take 200 mg by mouth daily.   0  . aspirin EC 81 MG tablet Take 1 tablet (81 mg total) by mouth daily. 90 tablet 3  . atorvastatin (LIPITOR) 40 MG tablet Take 1 tablet (40 mg total) by mouth daily. 90 tablet 3  . carvedilol (COREG) 25 MG tablet Take 1 tablet (25 mg total) by mouth 2 (two) times daily. 180 tablet 3  . colchicine 0.6 MG tablet Take 0.6 mg by mouth daily as needed (gouty flare).    . furosemide (LASIX) 20 MG tablet Take 1 tablet (20 mg total) by mouth daily. 90 tablet 3  . gabapentin (NEURONTIN) 300 MG capsule Take 300 mg by mouth 3 (three) times daily.    . hydrALAZINE (APRESOLINE) 50 MG tablet Take 1.5 tablets (75 mg total) by  mouth 3 (three) times daily. 405 tablet 3  . isosorbide mononitrate (IMDUR) 60 MG 24 hr tablet Take 1.5 tablets (90 mg total) by mouth daily. 270 tablet 3  . naproxen sodium (ANAPROX) 220 MG tablet Take 220 mg by mouth daily as needed (back pain). Reported on 08/26/2015    . omeprazole (PRILOSEC) 40 MG capsule Take 40 mg by mouth daily.  0  . sacubitril-valsartan (ENTRESTO) 49-51 MG Take 1 tablet by mouth 2 (two) times daily. 60 tablet 11  . spironolactone (ALDACTONE) 25 MG tablet Take 1 tablet (25 mg total) by mouth daily. 90 tablet 3  . traMADol (ULTRAM) 50 MG tablet Take 1 tablet (50 mg total) by mouth every 12 (twelve) hours as needed (twice daily as needed for pain). 60 tablet 0   No current  facility-administered medications for this encounter.    BP 116/64   Pulse 76   Wt 220 lb 12 oz (100.1 kg)   SpO2 100%   BMI 39.10 kg/m    Wt Readings from Last 3 Encounters:  02/10/16 220 lb 12 oz (100.1 kg)  12/10/15 218 lb (98.9 kg)  12/09/15 217 lb 6.4 oz (98.6 kg)    General: Elderly appearing HEENT: Normal.  Neck: JVP appears 7 cm.  no thyromegaly or thyroid nodule.  Lungs: Clear, normal effort.  CV: Nondisplaced PMI.  Heart regular S1/S2, no S3/S4, no murmur. No peripheral edema. No carotid bruit.  Normal pedal pulses.  Abdomen: Soft, NT, ND, no HSM. No bruits or masses. +BS  Skin: Intact without lesions or rashes.  Neurologic: Alert and oriented x 3.  Psych: Normal affect. Extremities: No clubbing or cyanosis.   Assessment/Plan: 1. HTN: Has history of CVA and CKD.  Renal artery dopplers were normal.  BP now controlled.  2. CAD: Moderate diffuse CAD, no severe obstruction.  This did not cause her cardiomyopathy.  No chest pain.  - Continue ASA 81 - Continue atorvastatin 40 mg daily, check lipids at next appt.    3. CKD III:  BMET today.  - Avoid NSAID use.   4. 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  EF has been persistently low, 32% by cardiac MRI in 12/16.  Unable to give contrast to assess for scar/infiltrative disease due to CKD.  She had Medtronic ICD placed in 2/17.  NYHA class II-III symptoms, more limited by arthritis pain currently.  She looks euvolemic.  - Continue lasix 20 mg daily.  - Stop amlodipine and increase hydralazine back to 75 mg tid.   - Continue imdur 90 mg daily - Continue spironolactone 25 mg daily for now.  - Continue Entresto 49/51 bid.  - Continue Coreg 25 mg bid.   - BMET today.  5. OSA: Uses CPAP.    Followup in 3 months.   Loralie Champagne,  02/10/2016

## 2016-02-10 NOTE — Patient Instructions (Signed)
STOP Amlodipine.  INCREASE Hydralazine to 75mg  (1&1/2 tablets) three times daily.  Routine lab work today. Will notify you of abnormal results  Follow up with Dr.McLean in 3 months

## 2016-02-16 ENCOUNTER — Ambulatory Visit
Admission: RE | Admit: 2016-02-16 | Discharge: 2016-02-16 | Disposition: A | Discharge status: Home / Self Care | Payer: PPO | Source: Ambulatory Visit | Attending: Family Medicine | Admitting: Family Medicine

## 2016-02-16 DIAGNOSIS — R928 Other abnormal and inconclusive findings on diagnostic imaging of breast: Secondary | ICD-10-CM

## 2016-02-16 DIAGNOSIS — N6311 Unspecified lump in the right breast, upper outer quadrant: Secondary | ICD-10-CM | POA: Diagnosis not present

## 2016-02-18 DIAGNOSIS — I639 Cerebral infarction, unspecified: Secondary | ICD-10-CM | POA: Diagnosis not present

## 2016-02-18 DIAGNOSIS — N19 Unspecified kidney failure: Secondary | ICD-10-CM | POA: Diagnosis not present

## 2016-02-18 DIAGNOSIS — I509 Heart failure, unspecified: Secondary | ICD-10-CM | POA: Diagnosis not present

## 2016-02-18 DIAGNOSIS — M1710 Unilateral primary osteoarthritis, unspecified knee: Secondary | ICD-10-CM | POA: Diagnosis not present

## 2016-02-18 DIAGNOSIS — I10 Essential (primary) hypertension: Secondary | ICD-10-CM | POA: Diagnosis not present

## 2016-03-24 DIAGNOSIS — M1000 Idiopathic gout, unspecified site: Secondary | ICD-10-CM | POA: Diagnosis not present

## 2016-03-24 DIAGNOSIS — M549 Dorsalgia, unspecified: Secondary | ICD-10-CM | POA: Diagnosis not present

## 2016-03-24 DIAGNOSIS — I639 Cerebral infarction, unspecified: Secondary | ICD-10-CM | POA: Diagnosis not present

## 2016-03-24 DIAGNOSIS — I10 Essential (primary) hypertension: Secondary | ICD-10-CM | POA: Diagnosis not present

## 2016-03-25 ENCOUNTER — Encounter (HOSPITAL_COMMUNITY): Payer: Self-pay | Admitting: Family Medicine

## 2016-03-25 NOTE — Progress Notes (Signed)
Mailed letter with Cardiac Rehab Program to pt ... KJ  °

## 2016-03-31 DIAGNOSIS — N19 Unspecified kidney failure: Secondary | ICD-10-CM | POA: Diagnosis not present

## 2016-03-31 DIAGNOSIS — I10 Essential (primary) hypertension: Secondary | ICD-10-CM | POA: Diagnosis not present

## 2016-04-02 ENCOUNTER — Encounter: Payer: Self-pay | Admitting: Cardiology

## 2016-04-02 DIAGNOSIS — M179 Osteoarthritis of knee, unspecified: Secondary | ICD-10-CM | POA: Diagnosis not present

## 2016-04-02 DIAGNOSIS — N181 Chronic kidney disease, stage 1: Secondary | ICD-10-CM | POA: Diagnosis not present

## 2016-04-02 DIAGNOSIS — I10 Essential (primary) hypertension: Secondary | ICD-10-CM | POA: Diagnosis not present

## 2016-04-06 ENCOUNTER — Other Ambulatory Visit: Payer: Self-pay | Admitting: Licensed Clinical Social Worker

## 2016-04-06 ENCOUNTER — Encounter: Payer: Self-pay | Admitting: Licensed Clinical Social Worker

## 2016-04-06 ENCOUNTER — Other Ambulatory Visit: Payer: Self-pay

## 2016-04-06 NOTE — Patient Outreach (Signed)
[facility]) Care Management  04/06/2016  Tara Dunn 09-21-1940 [id number]  Telephonic Screening and Initial Assessment  Referral date:  04/01/2016 Source:  [name] Issue:  Back pain, H/o CVA,  HH evaluation Insurance:  [employer] 1 PPO  Subjective: Outreach call #1 to patient.  Patient reached and completed call.  [facility] [address] [place] [address] [phone] (M)  Providers: Primary MD: [name] -  last appt: 03/24/16   next appt: 3 weeks (HTN) Endocrinologist: Cardiologist:  Dr. Loralie Champagne, [facility] - last appt 02/10/16 - next appt 3 months (approximately 05/2016).  HH: none - issues with co-pays  Psycho/Social: Patient lives alone in her home ([facility]).  Patient moved to [place] from [place] in 2015. Patient has supportive daughter who lives in [place], [place].  Mobility: Ambulates with a cane.  C/O difficulty walking due to back pain and gout left foot. H/o past CVA's and arthritis.  States doing ok with ADL's but needs help with house cleaning.  States daughter usually helps but has been in and out of the hospital over the past several weeks.  Falls: 1 - unable to recall date but over the past year.  Pain:  C/O back pain for more than one year and unable to state cause.  Pain worsens with walking, lifting, bending and pressure from laying down. Depression: sometimes but takes no medications for symptom management. Safety: no issues Transportation:  Archivist:: daughter, [name]. Advance Directive: Consent:  Patient gives verbal consent to discuss PHI and complete calls with her daughter, [name] as needed.  Resource Needs: Patient states she lives on limited SS income:  $1,178 / month.  States she has difficulty paying for her medical cost for MD visits.  States  she avoids some care such as eye appt's and hearing screenings due to cost of medical bills.   Patient C/O poor vision but unable to afford new updated glasses since last cataract surgery in 2017.  Patient agreed to [facility] SW Referral for financial assessment; community resources, eye care and glasses resources DME: cane, walker, scales, BP cuff (none) - ordered a cuff but was too small for arm and had to return; CPap,  Eyeglasses (needs updating); dentures: upper and lower.   Co-morbidities:  HTN, CKD III, Systolic CHF,  Gout Left foot, arthritis, obese, OSA (using CPap).  H/o ICD was placed in 03/2015.   H/o CVA: 3 prior CVA's, last in 1998.  Thought to be related to HTN. H/o Breast cancer: 2009, left mastectomy with chemotherapy.   Admissions: 0  ER visits: 0   BP 158/108  03/24/2016 Weight 218.2  03/24/16 Height 63 in (160 cm) 12/10/2015 BMI 37.5 (Obese Class II) 03/24/2016  Lipid Panel completed 10/28/2015 HDL 50.000 10/28/2015 LDL 57.000 10/28/2015 Cholesterol, total 123.000 10/28/2015 Triglycerides 79.000 10/28/2015 A1C 6.000 05/04/2014 Glucose Random 114.000 02/10/2016 MicroAlbumin Urine N/D MicroAlbumin/Creat N/D BUN 29.000 02/10/2016 Creatinine, Serum 1.590 02/10/2016 TSH N/D FOBT N/D PAP Smear N/D  HTN BP 158/108 on most recent PCP appt.  MD increased Amlodipine to 10 mg daily starting 03/24/16 and will follow-up with patient again in 3 weeks.  Patient with no home BP cuff.  Patient had issues getting correct cuff size.  States she ordered a cuff but had to return due to not fitting her arm; too small.   Back Pain and  GOUT H/o patient has not been taking her colchicine and MD discussed and advised colchicine PRN on 03/24/16 office appt.  PCP noted [facility] PT and safety evaluation in 03/24/16 office note; it is not clear if this has been ordered.   Patient receptive to Rheumatology referral for Gout care if needed but states difficulty affording co-pay's for care.   Medications:  Patient  taking less than 15 medications  Co-pay cost issues: $10 for most meds. Prevnar (PCV13) N/D Pneumovax (PPS N/D Flu Vaccine N/D - patient states she did get flu vaccine 2017 tDAP Vaccine N/D Pharmacy:  Mail order and [facility]  Encounter Medications:  Outpatient Encounter Prescriptions as of 04/06/2016  Medication Sig  . allopurinol (ZYLOPRIM) 100 MG tablet Take 200 mg by mouth daily.   Marland Kitchen aspirin EC 81 MG tablet Take 1 tablet (81 mg total) by mouth daily.  Marland Kitchen atorvastatin (LIPITOR) 40 MG tablet Take 1 tablet (40 mg total) by mouth daily.  . carvedilol (COREG) 25 MG tablet Take 1 tablet (25 mg total) by mouth 2 (two) times daily.  . colchicine 0.6 MG tablet Take 0.6 mg by mouth daily as needed (gouty flare).  . furosemide (LASIX) 20 MG tablet Take 1 tablet (20 mg total) by mouth daily.  Marland Kitchen gabapentin (NEURONTIN) 300 MG capsule Take 300 mg by mouth 3 (three) times daily.  . hydrALAZINE (APRESOLINE) 50 MG tablet Take 1.5 tablets (75 mg total) by mouth 3 (three) times daily.  . isosorbide mononitrate (IMDUR) 60 MG 24 hr tablet Take 1.5 tablets (90 mg total) by mouth daily.  . naproxen sodium (ANAPROX) 220 MG tablet Take 220 mg by mouth daily as needed (back pain). Reported on 08/26/2015  . omeprazole (PRILOSEC) 40 MG capsule Take 40 mg by mouth daily.  . sacubitril-valsartan (ENTRESTO) 49-51 MG Take 1 tablet by mouth 2 (two) times daily.  Marland Kitchen spironolactone (ALDACTONE) 25 MG tablet Take 1 tablet (25 mg total) by mouth daily.  . traMADol (ULTRAM) 50 MG tablet Take 1 tablet (50 mg total) by mouth every 12 (twelve) hours as needed (twice daily as needed for pain).   No facility-administered encounter medications on file as of 04/06/2016.     Functional Status:  In your present state of health, do you have any difficulty performing the following activities: 04/06/2016  Hearing? Y  Vision? Y  Difficulty concentrating or making decisions? N  Walking or climbing stairs? Y  Dressing or bathing? N   Doing errands, shopping? Y  Preparing Food and eating ? N  Using the Toilet? N  In the past six months, have you accidently leaked urine? N  Do you have problems with loss of bowel control? N  Managing your Medications? N  Managing your Finances? N  Housekeeping or managing your Housekeeping? N  Some recent data might be hidden    Fall/Depression Screening: PHQ 2/9 Scores 04/06/2016 06/05/2014  PHQ - 2 Score 1 0    Fall Risk  04/06/2016 05/07/2015 06/05/2014  Falls in the past year? Yes No Yes  Number falls in past yr: 1 - 1  Injury with Fall? No - No  Risk for fall due to : History of fall(s);Impaired balance/gait;Impaired mobility;Impaired vision - Impaired mobility;Impaired vision  Risk for fall due to (comments): - - uses cane and walker   Follow up Falls evaluation completed;Education provided;Falls prevention discussed - -    Preventives: Hearing: none.  States daughter tells her she is hard of hearing but never tested due to issues  with medical cost.  Eyes: C/O poor vision.  Right cataract surgery in the past while still living in [place].  Left cataract surgery in [place], [place] in 2017.  States unable to afford new glasses since most recent eye surgery in 2017.  Dentist: none. Wears dentures: upper and lower.  Colonoscopy N/D Mammogram: UL Right 02/16/2016  Quit smoking 2009.    Assessment / Plan:  Referral date:  04/01/2016 Source:  [name] Issue:  Back pain, H/o CVA,  HH evaluation Insurance:  [place] 1 PPO Screening and Initial Assessment:  04/06/16 Telephonic RN CM services:  04/06/16 Program:  HTN  04/06/16  THN Telephonic RN CM Referral 2/271/8 -BP cuff (large) needed  -HTN - uncontrolled RN CM will provide HTN management education and coaching.  RN CM will assess and monitor medication compliance RN CM will provide diet education:  Low salt, DASH eating plan.  -Fall prevention RN CM will follow up on MD's request for Home Health PT and Safety  Evaluation order.  Patient reports issues affording co-pays for services.   [facility] Social Work Referral 04/06/16 -Financial Assessment -Community resources:  Eye care and glasses -Medical bill issues with co-pays. -PCS worker services needed.    RN CM advised in next [facility] scheduled contact call within next 30 days for monthly assessment and care coordination services as needed.  RN CM advised to please notify MD of any changes in condition prior to scheduled appt's.   RN CM provided contact name and # [phone] or main office # [phone] and 24-hour nurse line # [phone].  RN CM confirmed patient is aware of 911 services for urgent emergency needs.  RN CM sent successful outreach letter and  [facility] Introductory package. RN CM sent Physician Enrollment/Barriers Letter and Initial Assessment to Primary MD RN CM notified [place] Management Assistant: agreed to services/case opened.  [facility] CM Care Plan Problem One   Flowsheet Row Most Recent Value  Care Plan Problem One  HTN - uncontrolled.   Role Documenting the Problem One  Care Management Telephonic Coordinator  Care Plan for Problem One  Active  THN Long Term Goal (31-90 days)  Patient will decrease BP readings into normal range over the next 31-90 days.   THN Long Term Goal Start Date  04/06/16  Interventions for Problem One Long Term Goal  RN CM will provide education on HTN Managment over the next 31-90 days.   THN CM Short Term Goal #1 (0-30 days)  Patient will check and log BP readings daily over the next 30 days.   THN CM Short Term Goal #1 Start Date  04/06/16  Interventions for Short Term Goal #1  RN CM will provide education on importance of daily logging over the next 30 days.   THN CM Short Term Goal #2 (0-30 days)  Patient will obtain proper fitting BP cuff over the next 30 days.   THN CM Short Term Goal #2 Start Date  04/06/16  Interventions for Short Term Goal #2  RN CM will monitor progress on obtaining cuff over the  next 30 days.     [facility] CM Care Plan Problem Two   Flowsheet Row Most Recent Value  Care Plan Problem Two  Medication Adherence   Role Documenting the Problem Two  Care Management Telephonic Coordinator  Care Plan for Problem Two  Active  THN CM Short Term Goal #1 (0-30 days)  Patient will take medications as ordered over the next 30 days.  THN CM Short Term Goal #1 Start Date  04/06/16  Interventions for Short Term Goal #2   RN CM will monitor medication adherence over the next 30 days.     [facility] CM Care Plan Problem Three   Flowsheet Row Most Recent Value  Care Plan Problem Three  Community Resource Needs  Role Documenting the Problem Three  Care Management Telephonic Coordinator  Care Plan for Problem Three  Active  THN CM Short Term Goal #1 (0-30 days)  Patient will engage with [facility] SW over the next 30 days.   THN CM Short Term Goal #1 Start Date  04/06/16  Interventions for Short Term Goal #1  RN CM will send [facility] SW Referral Request over the next 30 days.       Nathaneil Canary, BSN, RN, [place] Care Management Care Management Coordinator [phone] Direct [phone] Cell [phone] Office [phone] Fax [email or web address]

## 2016-04-06 NOTE — Patient Outreach (Signed)
[facility]) Care Management  04/06/2016  Tara Dunn [birth year] [id number]  Assessment- CSW received new referral on patient today on 04/06/16 that states Patient states she lives on limited SS income:  $1,178 / month.  States she has difficulty paying for her medical cost for MD visits. States she avoids some care such as eye appt's and hearing screenings due to cost of medical bills.   Patient C/O poor vision but unable to afford new updated glasses since last cataract surgery in 2017.  Patient agreed to [facility] SW Referral for financial assessment; community resources, eye care and glasses resources.  CSW completed initial outreach and spoke to patient. HIPPA verifications were received. CSW introduced self, reason for call and of [facility] services. She is agreeable to social work assistance. Patient states that she struggles with affording cost for medical appointments and needs. CSW questioned if she received any form of assistance from DSS and she declined. Patient will not be eligible for [employer] because she does not meet this eligibility requirement. CSW questioned if she has considered applying for Medicaid and she states that she has thought about it but has never followed through with applying. CSW provided education on how to apply for Medicaid and informed her that she will probably qualify for partial Medicaid (Medicaid MQB) but that this will still assist her with affording her medical care. CSW questioned if she could complete home visit this week in order to give her financial resources, education on how to apply for Medicaid, community resources and information on the [employer] in case she were to ever become eligible for program. She was agreeable to home visit this week.  Plan-CSW will send involvement letter to PCP. CSW will complete home visit on 04/08/16.  Eula Fried, BSW, MSW, [email or web address] Phone: [phone] Fax: [phone]

## 2016-04-08 ENCOUNTER — Other Ambulatory Visit: Payer: Self-pay | Admitting: Licensed Clinical Social Worker

## 2016-04-08 NOTE — Patient Outreach (Signed)
[facility]) Care Management  [facility] Social Work  04/08/2016  Ayris Kawabata 07/22/1940 [id number]:   Encounter Medications:  Outpatient Encounter Prescriptions as of 04/08/2016  Medication Sig  . allopurinol (ZYLOPRIM) 100 MG tablet Take 200 mg by mouth daily.   Marland Kitchen aspirin EC 81 MG tablet Take 1 tablet (81 mg total) by mouth daily.  Marland Kitchen atorvastatin (LIPITOR) 40 MG tablet Take 1 tablet (40 mg total) by mouth daily.  . carvedilol (COREG) 25 MG tablet Take 1 tablet (25 mg total) by mouth 2 (two) times daily.  . colchicine 0.6 MG tablet Take 0.6 mg by mouth daily as needed (gouty flare).  . furosemide (LASIX) 20 MG tablet Take 1 tablet (20 mg total) by mouth daily.  Marland Kitchen gabapentin (NEURONTIN) 300 MG capsule Take 300 mg by mouth 3 (three) times daily.  . hydrALAZINE (APRESOLINE) 50 MG tablet Take 1.5 tablets (75 mg total) by mouth 3 (three) times daily.  . isosorbide mononitrate (IMDUR) 60 MG 24 hr tablet Take 1.5 tablets (90 mg total) by mouth daily.  . naproxen sodium (ANAPROX) 220 MG tablet Take 220 mg by mouth daily as needed (back pain). Reported on 08/26/2015  . omeprazole (PRILOSEC) 40 MG capsule Take 40 mg by mouth daily.  . sacubitril-valsartan (ENTRESTO) 49-51 MG Take 1 tablet by mouth 2 (two) times daily.  Marland Kitchen spironolactone (ALDACTONE) 25 MG tablet Take 1 tablet (25 mg total) by mouth daily.  . traMADol (ULTRAM) 50 MG tablet Take 1 tablet (50 mg total) by mouth every 12 (twelve) hours as needed (twice daily as needed for pain).   No facility-administered encounter medications on file as of 04/08/2016.     Functional Status:  In your present state of health, do you have any difficulty performing the following activities: 04/06/2016  Hearing? Y  Vision? Y  Difficulty concentrating or making decisions? N  Walking or climbing stairs? Y  Dressing or bathing? N  Doing errands, shopping? Y  Preparing Food and eating ? N  Using the Toilet? N  In the past six months, have  you accidently leaked urine? N  Do you have problems with loss of bowel control? N  Managing your Medications? N  Managing your Finances? N  Housekeeping or managing your Housekeeping? N  Some recent data might be hidden    Fall/Depression Screening:  PHQ 2/9 Scores 04/08/2016 04/06/2016 06/05/2014  PHQ - 2 Score 0 1 0    Assessment: CSW completed initial home visit with patient on 04/08/16. Patient resides at [facility]. Patient has a history of CHF, SOB, Chronic Kidney Disease and Hypertension. Patient reports that she moved to [place] 2 1/2 years ago from [place]. She states "I love it here." She reports that she has stable transportation through [employer] as well as her daughter. She states that her daughter lives in [place], [place] and is her main support network. She reports that she is a widow and that her spouse passed away from cancer in 1986/05/26. She denies needing any mental health or grief resources at this time but admits that she often isolates because "I like to be alone and keep to myself." Patient reports that she does not participate in any activities at facility where she resides because she has difficulty walking. CSW informed her that there are other activities that she can participate in besides the exercise and walking group activities. CSW reviewed ILF activity calendar for this month which include: Card group, Bingo, Wii Bowling, [employer] Social,  [place], Bible Study, Movie Night, etc. Patient is agreeable to participating in one activity at ILF over the next 30 days.  Patient informed CSW yesterday that her total monthly income is $1,178 from [employer] but forgot to include her pension of $137.00 which puts her monthly income at $1,315. Patient is not eligible for Full Adult Medicaid but CSW provided a handout which includes instructions on how to apply for Medicaid and helpful tips to consider while applying. CSW informed patient that if she is approved for Medicaid then she will  be have Medicaid MQB. CSW educated patient on what Medicaid MQB is and what assistance is can provide. Patient will consider applying for Medicaid but at this time is not sure if she wishes to do so.  CSW provided education on financial resources, food stamps and information on the [employer]. Patient currently is using a $100 pair of eyeglasses that she received when she was residing in [place]. However, patient states that she needs assistance with gaining a new pair. Patient is not eligible for [employer] program at this time unless she has either Medicaid or food stamps. CSW provided education on food stamps and patient is agreeable to CSW completing application for her. Food stamps application was successfully completed and faxed today. Patient is aware that she will receive notification in regards to her approval by mail. CSW provided handout on financial resources and food assistance and reviewed these with her. Patient appreciative of assistance. Patient is interested in gaining the [place] (food pantries in [place] as well as free meals.) CSW will mail requested resources to her location and will review them with her if needed.  Patient reports that she has difficulty walking and uses her cane. She states that she often experiences SOB but denies having any chest pain or SOB today. She states that she takes her medications as prescribed. Patient denies having any falls within the past year. Patient reports that she needs assistance with cleaning. CSW informed her that there are no cleaning resources at this time but that the ILF that she resides in has cleaning services that she can pay for.   Plan: CSW will route encounter to PCP. CSW will fax food stamps application. CSW will send [place] to patient by mail. CSW will follow up within 2-3 weeks.  THN CM Care Plan Problem One   Flowsheet Row Most Recent Value  Care Plan  Problem One  Lack of financial resources and overall support  Role Documenting the Problem One  Clinical Social Worker  Care Plan for Problem One  Active  THN Long Term Goal (31-90 days)  Patient will be informed if she is eligible for food stamps or not within 90 days per patient report to [place] Term Goal Start Date  04/08/16  Interventions for Problem One Long Term Goal  CSW completed home visit and completed food stamps. CSW will fax completed application. CSW has provided education on other food assistance resources. CSW will follow up as needed.  THN CM Short Term Goal #1 (0-30 days)  Patient will participate in one socialization activity at ILF within 30 days per patient report  THN CM Short Term Goal #1 Start Date  04/08/16  Interventions for Short Term Goal #1  CSW provided encouragement and MI intervention to support patient in gaining appropriate socialization. CSW reviewed ILF's activity calendar with patient as well and  educated her on the importance of decreasing isolation.     Eula Fried, BSW, MSW, [email or web address] Phone: [phone] Fax: [phone]

## 2016-04-08 NOTE — Patient Outreach (Signed)
[place] [facility]) Care Management  04/08/2016  Tara Dunn 1940/11/21 [id number]   Request received from Eula Fried LCSW to mail patient food pantry resources.   Jacqulynn Cadet  [facility] Care Management Assistant

## 2016-04-09 ENCOUNTER — Ambulatory Visit: Payer: Self-pay

## 2016-04-12 ENCOUNTER — Other Ambulatory Visit: Payer: Self-pay

## 2016-04-12 NOTE — Patient Outreach (Addendum)
[place] [facility]) Care Management  04/12/2016  Ninon Lenderman 1940-06-21 [id number]   Telephonic Care Coordination Services  Referral date:  04/01/2016 Source:  [name] Issue:  Back pain, H/o CVA,  [facility] evaluation Insurance:  [place] 1 PPO Screening and Initial Assessment:  04/06/16 Telephonic RN CM services:  04/06/16 Program:  HTN  04/06/16  THN Telephonic RN CM Referral 2/271/8   BP 158/108  03/24/2016 Weight 218.2  03/24/16 Height 63 in (160 cm) 12/10/2015 BMI 37.5 (Obese Class II) 03/24/2016 -BP cuff (large) needed - patient states she is planning to purchase next week. -HTN - uncontrolled - states next PCP appt scheduled for March or April. RN CM will provide HTN management education and coaching.  RN CM will assess and monitor medication compliance RN CM will provide diet education:  Low salt, DASH eating plan.  RN CM encouraged Fall prevention RN CM followed-up on MD's request for Home Health PT and Safety Evaluation order; however, Patient reports issues affording co-pays for [facility] services.   [facility] Social Work Referral 04/06/16  (SW update.  See SW note 04/08/16 for updates; SW remains active with case). -Chiropodist -Community resources:  Eye care and glasses -Medical bill issues with co-pays. -PCS worker services needed.    Nathaneil Canary, BSN, RN, [place] Management Care Management Coordinator [phone] Direct [phone] Cell [phone] Office [phone] Fax [email or web address]

## 2016-04-16 ENCOUNTER — Other Ambulatory Visit: Payer: Self-pay

## 2016-04-16 NOTE — Patient Outreach (Addendum)
[facility]) Care Management  04/16/2016  Tara Dunn 08-18-1940 [id number]  Telephonic Monthly Assessment and Care Coordination of Services (BP Cuff)  Subjective: Outreach call #1 to patient.  Patient reached and completed call.  [facility] [address] [place] [address] [phone] (M)  Providers: Primary MD: [name] -  last appt: 03/24/16   next appt: April 2018 Endocrinologist: Cardiologist:  Dr. Loralie Champagne, [facility] - last appt 02/10/16 - next appt 3 months (approximately 05/2016).  HH: none  - states issues with co-pays and avoids HH services.  Insurance:  [employer] 1 PPO  Psycho/Social: Patient lives alone in her home ([facility]).  Patient moved to [place] from [place] in 2015. Patient has supportive daughter who lives in [place], [place].  Mobility: Ambulates with a cane.  C/O difficulty walking due to back pain and gout left foot. H/o past CVA's and arthritis.  States doing ok with ADL's but needs help with house cleaning.  States daughter usually helps but has been in and out of the hospital over the past several weeks.  Falls: 1 - unable to recall date but over the past year.  Pain:  C/O back pain for more than one year and unable to state cause.  Pain worsens with walking, lifting, bending and pressure from laying down. Depression: sometimes but takes no medications for symptom management. Safety: no issues Transportation:  Archivist:: daughter, [name]. Advance Directive: Consent:  Patient gives verbal consent to discuss PHI and complete calls with her daughter, [name] as needed.  Resource Needs: Patient with limited SS income:  $1,178 / month.  Patient  has difficulty paying for her medical cost for MD visits.  Patient avoids some care such as eye appt's and hearing screenings  due to cost of medical bills.   Patient C/O poor vision but unable to afford new updated glasses since last cataract surgery in 2017.  Patient avoids [place] services to avoid co-pay cost.   Patient agreed to [facility] SW Referral for financial assessment; community resources, eye care and glasses resources (Referral sent 04/06/16 and remains active).   DME: cane, walker, scales, BP cuff (none) - ordered a cuff but was too small for arm and had to return (THN BP cuff mailed to patient 04/16/16); CPap,  Eyeglasses (needs updating); dentures: upper and lower.   Co-morbidities:  HTN, CKD III, Systolic CHF,  Gout Left foot, arthritis, obese, OSA (using CPap).   ICD (03/2015); CVA: 3 prior CVA's, last in 1998.  Thought to be related to HTN.  H/o Breast cancer: 2009, left mastectomy with chemotherapy.  Admissions: 0  ER visits: 0   BP 158/108  03/24/2016 Weight 218.2  03/24/16 Height 63 in (160 cm) 12/10/2015 BMI 37.5 (Obese Class II) 03/24/2016  Lipid Panel completed 10/28/2015 HDL 50.000 10/28/2015 LDL 57.000 10/28/2015 Cholesterol, total 123.000 10/28/2015 Triglycerides 79.000 10/28/2015 A1C 6.000 05/04/2014 Glucose Random 114.000 02/10/2016 MicroAlbumin Urine N/D MicroAlbumin/Creat N/D BUN 29.000 02/10/2016 Creatinine, Serum 1.590 02/10/2016 TSH N/D FOBT N/D PAP Smear N/D  HTN BP 158/108 on most recent PCP appt.  MD increased Amlodipine to 10 mg daily starting 03/24/16 and will follow-up with patient again in 3 weeks (April 2018).  Patient with no home BP cuff.  Patient had issues getting correct cuff size.  States she ordered a cuff but had to return due to not fitting  her arm; too small.  Patient reports each RN CM call that she will "get one next week."   Patient has financial issues and avoids healthcare cost.    Back Pain and GOUT H/o patient has not been taking her colchicine and MD discussed and advised colchicine PRN on 03/24/16 office appt.  PCP noted [facility] and safety evaluation in 03/24/16 office note; it is  not clear if this has been ordered.   Patient receptive to Rheumatology referral for Gout care if needed but states difficulty affording co-pay's for care.   Medications:  Patient taking less than 15 medications  Co-pay cost issues: $10 for most meds. Prevnar (PCV13) N/D Pneumovax (PPS N/D Flu Vaccine N/D - patient states she did get flu vaccine 2017 tDAP Vaccine N/D Pharmacy:  Mail order and [facility]  Encounter Medications:  Outpatient Encounter Prescriptions as of 04/16/2016  Medication Sig  . allopurinol (ZYLOPRIM) 100 MG tablet Take 200 mg by mouth daily.   Marland Kitchen aspirin EC 81 MG tablet Take 1 tablet (81 mg total) by mouth daily.  Marland Kitchen atorvastatin (LIPITOR) 40 MG tablet Take 1 tablet (40 mg total) by mouth daily.  . carvedilol (COREG) 25 MG tablet Take 1 tablet (25 mg total) by mouth 2 (two) times daily.  . colchicine 0.6 MG tablet Take 0.6 mg by mouth daily as needed (gouty flare).  . furosemide (LASIX) 20 MG tablet Take 1 tablet (20 mg total) by mouth daily.  Marland Kitchen gabapentin (NEURONTIN) 300 MG capsule Take 300 mg by mouth 3 (three) times daily.  . hydrALAZINE (APRESOLINE) 50 MG tablet Take 1.5 tablets (75 mg total) by mouth 3 (three) times daily.  . isosorbide mononitrate (IMDUR) 60 MG 24 hr tablet Take 1.5 tablets (90 mg total) by mouth daily.  . naproxen sodium (ANAPROX) 220 MG tablet Take 220 mg by mouth daily as needed (back pain). Reported on 08/26/2015  . omeprazole (PRILOSEC) 40 MG capsule Take 40 mg by mouth daily.  . sacubitril-valsartan (ENTRESTO) 49-51 MG Take 1 tablet by mouth 2 (two) times daily.  Marland Kitchen spironolactone (ALDACTONE) 25 MG tablet Take 1 tablet (25 mg total) by mouth daily.  . traMADol (ULTRAM) 50 MG tablet Take 1 tablet (50 mg total) by mouth every 12 (twelve) hours as needed (twice daily as needed for pain).   No facility-administered encounter medications on file as of 04/16/2016.     Functional Status:  In your present state of health, do you have any difficulty  performing the following activities: 04/06/2016  Hearing? Y  Vision? Y  Difficulty concentrating or making decisions? N  Walking or climbing stairs? Y  Dressing or bathing? N  Doing errands, shopping? Y  Preparing Food and eating ? N  Using the Toilet? N  In the past six months, have you accidently leaked urine? N  Do you have problems with loss of bowel control? N  Managing your Medications? N  Managing your Finances? N  Housekeeping or managing your Housekeeping? N  Some recent data might be hidden    Fall/Depression Screening: PHQ 2/9 Scores 04/08/2016 04/06/2016 06/05/2014  PHQ - 2 Score 0 1 0    Fall Risk  04/06/2016 05/07/2015 06/05/2014  Falls in the past year? Yes No Yes  Number falls in past yr: 1 - 1  Injury with Fall? No - No  Risk for fall due to : History of fall(s);Impaired balance/gait;Impaired mobility;Impaired vision - Impaired mobility;Impaired vision  Risk for fall due to (comments): - - uses cane  and walker   Follow up Falls evaluation completed;Education provided;Falls prevention discussed - -    Preventives: Hearing: none.  States daughter tells her she is hard of hearing but never tested due to issues with medical cost.  Eyes: C/O poor vision.  Right cataract surgery in the past while still living in [place].  Left cataract surgery in [place], [place] in 2017.  Patient unable to afford new glasses since most recent eye surgery in 2017.  Dentist: none. Wears dentures: upper and lower.  Colonoscopy N/D Mammogram: UL Right 02/16/2016  Quit smoking 2009.    Assessment / Plan:  Referral date:  04/01/2016 Source:  [name] Issue:  Back pain, H/o CVA,  HH evaluation Insurance:  [employer] 1 PPO Screening and Initial Assessment:  04/06/16 Telephonic RN CM services:  04/06/16 Program:  HTN  04/06/16  Risk of Admission or ED Visit Score:  26%  HTN -RN CM advised in [facility] resource for BP cuff.  RN CM advised to expect mailed BP cuff over the next 1-2 weeks and  notify RN CM if not received.  Patient states she knows how to use the cuff.  RN CM advised should patient have problems using cuff to notify RN CM so that further teaching and interventions can be planned.   RN CM encouraged to take medications as MD has ordered.  RN CM encouraged patient to keep all MD follow-up appt's.  RN CM provided diet education:  Low salt, DASH eating plan.   [facility] Education Materials (mailed 04/16/16) -DASH Diet -Low Salt Diet  -High Blood Pressure (Hypertension):  Taking Your Blood Pressure  -Keeping An Eye On Your Blood Pressure -Medications For High Blood Pressure (Hypertension) -Medications For High Blood Pressure -Controlling Your Blood Pressure Through Lifestyle -Low Salt / How To Read A Label Chart -THN Calendar  DME:   Blood Pressure cuff mailed to patient 04/16/2016  Fall Risk -RN CM encouraged safety/fall prevention and use of cane.   -Patient refuses [place] services due to cost of co-pays. (SW active on case).    [facility] Social Work Referral 04/06/16 - remains active) -Chiropodist -Community resources:  Eye care and glasses -Medical bill issues with co-pays. -PCS worker services needed.    RN CM advised in next [facility] scheduled contact call within next 30 days for monthly assessment and care coordination services as needed. RN CM advised to please notify MD of any changes in condition prior to scheduled appt's.   RN CM provided contact name and # [phone] or main office # [phone] and 24-hour nurse line # [phone].  RN CM confirmed patient is aware of 911 services for urgent emergency needs.  Nathaneil Canary, BSN, RN, [place] Management Care Management Coordinator [phone] Direct [phone] Cell [phone] Office [phone] Fax [email or web address]

## 2016-04-21 ENCOUNTER — Other Ambulatory Visit: Payer: Self-pay | Admitting: Licensed Clinical Social Worker

## 2016-04-21 NOTE — Patient Outreach (Signed)
[facility]) Care Management  04/21/2016  Tara Dunn Jul 04, 1940 [id number]   Assessment- CSW completed outreach to patient and she answered. CSW questioned if she had heard anything from [employer] yet. She reports that she received a letter in the mail but had difficulty reading the small print. She stated that her daughter came over and read it to her and she was denied Food Stamps because she made over the income limit. CSW informed her that she will now not qualify for [employer]. 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 Patient should be expecting a BP cuff to be mailed to her provided by [facility] RNCM. CSW completed brief review of all community resources.  Plan-CSW will follow up within one-two weeks and will continue to provide social work support and assistance.  Tara Dunn, Tara Dunn, Tara Dunn, [email or web address] Phone: [phone] Fax: [phone]

## 2016-04-26 ENCOUNTER — Other Ambulatory Visit: Payer: Self-pay | Admitting: Licensed Clinical Social Worker

## 2016-04-26 ENCOUNTER — Encounter: Payer: Self-pay | Admitting: Licensed Clinical Social Worker

## 2016-04-26 NOTE — Patient Outreach (Signed)
[facility]) Care Management  04/26/2016  Lesta Hennon 1940/03/05 [id number]   Assessment- CSW completed outreach to patient. Patient answered. She reports that she is doing well. She states that her walking "is about the same." CSW encouraged patient to consider participating in the [facility]'s daily activities in order to gain socialization. She shares that she has thought about this and will make the effort to participate in the activities that she feels comfortable with doing. She states that she does not wish to participate in their walking groups but will consider activities where she can be sitting down. Patient did not qualify for food stamps and therefore is not able to use the [employer] program to gain a new pair of eyeglasses. CSW explained that the [employer] is the main eyeglass resources at this time. However, [facility] has a [facility] through [employer] that will be providing free eye exams to all if she is interested. Patient reports that she will discuss this with her daughter. CSW informed her that the [place] will take place on 04/29/16 at the [facility]. 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 CSW will complete social work discharge at this time.  Plan-CSW will inform [facility] RNCM and PCP of discharge. CSW will close case at this time.  Eula Fried, BSW, MSW, [email or web address] Phone: [phone] Fax: [phone]

## 2016-05-04 ENCOUNTER — Ambulatory Visit: Payer: PPO

## 2016-05-14 ENCOUNTER — Ambulatory Visit: Payer: PPO

## 2016-05-14 ENCOUNTER — Ambulatory Visit: Payer: Self-pay | Admitting: *Deleted

## 2016-05-19 ENCOUNTER — Encounter: Payer: Self-pay | Admitting: Internal Medicine

## 2016-05-19 ENCOUNTER — Ambulatory Visit (INDEPENDENT_AMBULATORY_CARE_PROVIDER_SITE_OTHER): Payer: PPO | Admitting: Internal Medicine

## 2016-05-19 ENCOUNTER — Encounter: Payer: Self-pay | Admitting: *Deleted

## 2016-05-19 VITALS — BP 165/90 | HR 106 | Ht 63.0 in | Wt 225.0 lb

## 2016-05-19 DIAGNOSIS — I5022 Chronic systolic (congestive) heart failure: Secondary | ICD-10-CM | POA: Diagnosis not present

## 2016-05-19 LAB — CUP PACEART INCLINIC DEVICE CHECK
Date Time Interrogation Session: 20180411143148
HighPow Impedance: 70 Ohm
Implantable Lead Implant Date: 20170215
Implantable Lead Location: 753860
Implantable Pulse Generator Implant Date: 20170215
Lead Channel Pacing Threshold Amplitude: 0.75 V
Lead Channel Pacing Threshold Pulse Width: 0.4 ms
Lead Channel Sensing Intrinsic Amplitude: 24.5 mV
MDC IDC MSMT BATTERY REMAINING LONGEVITY: 133 mo
MDC IDC MSMT BATTERY VOLTAGE: 3.03 V
MDC IDC MSMT LEADCHNL RV IMPEDANCE VALUE: 380 Ohm
MDC IDC MSMT LEADCHNL RV IMPEDANCE VALUE: 456 Ohm
MDC IDC MSMT LEADCHNL RV SENSING INTR AMPL: 19.375 mV
MDC IDC SET LEADCHNL RV PACING AMPLITUDE: 2.5 V
MDC IDC SET LEADCHNL RV PACING PULSEWIDTH: 0.4 ms
MDC IDC SET LEADCHNL RV SENSING SENSITIVITY: 0.3 mV
MDC IDC STAT BRADY RV PERCENT PACED: 0.04 %

## 2016-05-19 MED ORDER — CARVEDILOL 25 MG PO TABS: 25.0000 mg | ORAL_TABLET | Freq: Two times a day (BID) | ORAL | 3 refills | Status: DC

## 2016-05-19 MED ORDER — ATORVASTATIN CALCIUM 40 MG PO TABS: 40.0000 mg | ORAL_TABLET | Freq: Every day | ORAL | 3 refills | Status: DC

## 2016-05-19 MED ORDER — FUROSEMIDE 20 MG PO TABS: 20.0000 mg | ORAL_TABLET | Freq: Every day | ORAL | 3 refills | Status: DC

## 2016-05-19 MED ORDER — SPIRONOLACTONE 25 MG PO TABS: 25.0000 mg | ORAL_TABLET | Freq: Every day | ORAL | 3 refills | Status: DC

## 2016-05-19 MED ORDER — HYDRALAZINE HCL 50 MG PO TABS: 75.0000 mg | ORAL_TABLET | Freq: Three times a day (TID) | ORAL | 3 refills | Status: DC

## 2016-05-19 MED ORDER — ISOSORBIDE MONONITRATE ER 60 MG PO TB24: 90.0000 mg | ORAL_TABLET | Freq: Every day | ORAL | 3 refills | Status: DC

## 2016-05-19 MED ORDER — SACUBITRIL-VALSARTAN 49-51 MG PO TABS: 1.0000 | ORAL_TABLET | Freq: Two times a day (BID) | ORAL | 11 refills | Status: DC

## 2016-05-19 NOTE — Progress Notes (Signed)
HPI Tara Dunn returns today for followup of her ICD. She has a non-ischemic CM, and chronic systolic heart failure, Ef 30% by echo/MRI. The patient has been on maximal medical therapy although she admits to non-compliance. She has LVH and IVCD and QRS of 130. No syncope. Her PR is short. She underwent insertion of a single chamber ICD 14 months ago. In the interim, she has done well but admits to running out of her meds a week ago. She is not sure which she is not taking but thinks she has missed her coreg. Allergies  Allergen Reactions  . Shellfish Allergy Hives     Current Outpatient Prescriptions  Medication Sig Dispense Refill  . allopurinol (ZYLOPRIM) 100 MG tablet Take 200 mg by mouth daily.   0  . aspirin EC 81 MG tablet Take 1 tablet (81 mg total) by mouth daily. 90 tablet 3  . atorvastatin (LIPITOR) 40 MG tablet Take 1 tablet (40 mg total) by mouth daily. 90 tablet 3  . carvedilol (COREG) 25 MG tablet Take 1 tablet (25 mg total) by mouth 2 (two) times daily. 180 tablet 3  . colchicine 0.6 MG tablet Take 0.6 mg by mouth daily as needed (gouty flare).    . furosemide (LASIX) 20 MG tablet Take 1 tablet (20 mg total) by mouth daily. 90 tablet 3  . gabapentin (NEURONTIN) 300 MG capsule Take 300 mg by mouth 3 (three) times daily.    . hydrALAZINE (APRESOLINE) 50 MG tablet Take 1.5 tablets (75 mg total) by mouth 3 (three) times daily. 405 tablet 3  . isosorbide mononitrate (IMDUR) 60 MG 24 hr tablet Take 1.5 tablets (90 mg total) by mouth daily. 270 tablet 3  . naproxen sodium (ANAPROX) 220 MG tablet Take 220 mg by mouth daily as needed (back pain). Reported on 08/26/2015    . omeprazole (PRILOSEC) 40 MG capsule Take 40 mg by mouth daily.  0  . sacubitril-valsartan (ENTRESTO) 49-51 MG Take 1 tablet by mouth 2 (two) times daily. 60 tablet 11  . spironolactone (ALDACTONE) 25 MG tablet Take 1 tablet (25 mg total) by mouth daily. 90 tablet 3  . traMADol (ULTRAM) 50 MG tablet Take 1  tablet (50 mg total) by mouth every 12 (twelve) hours as needed (twice daily as needed for pain). 60 tablet 0   No current facility-administered medications for this visit.      Past Medical History:  Diagnosis Date  . 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 Lovena Le  . Aortic dilatation ([place])    a. 04/2014 CTA chest w/ incidental finding of distal thoracic Ao enlargement of 3.39 mm - f/u needed 04/2015.  . Arthritis    "all over my body"  . Cancer of left breast ([place]) 2009   s/p L mastectomy and chemo.  . Cardiomyopathy ([place])    a. 04/2014 Echo: EF 25-30%, mod conc LVH, possibl antsept HK, Gr1 DD, mod-sev dil LA.  . CHF (congestive heart failure) ([place])   . CKD (chronic kidney disease), stage III   . Depression   . GERD (gastroesophageal reflux disease)   . Gout   . Heart murmur   . History of stomach ulcers   . Hypertension    a. Dx @ age 62;  b. 04/2014 admission for HTN emergency.  . Obesity (BMI 30-39.9) 01/21/2015  . OSA on CPAP   . Stroke [facility])    a. 3 strokes - last in 1998; "memory  problems since"  (03/26/2015)    ROS:   All systems reviewed and negative except as noted in the HPI.   Past Surgical History:  Procedure Laterality Date  . ABDOMINAL HYSTERECTOMY    . BREAST BIOPSY Left 2008  . CATARACT EXTRACTION, BILATERAL Bilateral   . DILATION AND CURETTAGE OF UTERUS    . EP IMPLANTABLE DEVICE N/A 03/26/2015   MDT single chamber ICD, Dr. Lovena Le  . LEFT HEART CATHETERIZATION WITH CORONARY ANGIOGRAM N/A 05/07/2014   Procedure: LEFT HEART CATHETERIZATION WITH CORONARY ANGIOGRAM;  Surgeon: Belva Crome, MD;  [facility] C[facility] LAB;  Service: Cardiovascular;  Laterality: N/A;  . MASTECTOMY Left 2009     Family History  Problem Relation Age of Onset  . High blood pressure Mother     Died @ 28.  . High blood pressure Father   . Heart attack Father     Died in his early 16's.  . High blood pressure Sister   . Cancer Sister   .  Cancer Daughter   . Heart disease Daughter      Social History   Social History  . Marital status: Widowed    Spouse name: N/A  . Number of children: N/A  . Years of education: N/A   Occupational History  . Not on file.   Social History Main Topics  . Smoking status: Former Smoker    Packs/day: 0.50    Years: 40.00    Types: Cigarettes  . Smokeless tobacco: Never Used     Comment: Quit in 2009.  Marland Kitchen Alcohol use No     Comment: "drank occasionally when I was young; last drink was in the late 1990s"  . Drug use: No  . Sexual activity: No   Other Topics Concern  . Not on file   Social History Narrative   Lives alone in an independent living facility.  Education: 10th grade.     Retired from [employer] work.  Moved here from [place] in 2015.     BP (!) 165/90   Pulse (!) 106   Ht 5\' 3"  (1.6 m)   Wt 225 lb (102.1 kg)   SpO2 98%   BMI 39.86 kg/m   Physical Exam:  Well appearing 76 yo woman, NAD HEENT: Unremarkable Neck:  6 cm JVD, no thyromegally Lymphatics:  No adenopathy Back:  No CVA tenderness Lungs:  Clear with no wheezes HEART:  Regular rate rhythm, no murmurs, no rubs, no clicks Abd:  soft, positive bowel sounds, no organomegally, no rebound, no guarding Ext:  2 plus pulses, no edema, no cyanosis, no clubbing Skin:  No rashes no nodules Neuro:  CN II through XII intact, motor grossly intact  EKG - nsr with LVH and IVCD  Assess/Plan: 1. Chronic systolic heart failure - her symptoms are not well controlled but she has run out of her meds. These will be re-prescribed. 2. HTN - her blood pressure is not well controlled. She admits to being off of her meds. She is encouraged to lose weight. She notes that her pressures are usually much better. We have refilled her meds. 3. ICD - her medtronic single chamber MRI compatible device is working normally. Will recheck in several months. 4. CAD - I have asked her to increase her activity. She is very sedentary. She states  that she has joined a gym.    Mikle Bosworth.D.

## 2016-05-19 NOTE — Patient Instructions (Signed)
Your physician recommends that you continue on your current medications as directed. Please refer to the Current Medication list given to you today.  Your physician wants you to follow-up in: [place]  7-11  [place] will receive a reminder letter in the mail two months in advance. If you don't receive a letter, please call our office to schedule the follow-up appointment.

## 2016-05-20 ENCOUNTER — Ambulatory Visit: Payer: Self-pay | Admitting: *Deleted

## 2016-05-24 ENCOUNTER — Ambulatory Visit: Payer: Self-pay | Admitting: *Deleted

## 2016-05-24 ENCOUNTER — Telehealth: Payer: Self-pay | Admitting: *Deleted

## 2016-05-24 NOTE — Telephone Encounter (Signed)
Patient called stating she took her water reservoir out to fill it up, put it back and now there is a light on and the machine is not blowing. I told her I would call [facility] to help talk her through what she needs to do.  She was grateful and thanked me. I called [facility] spoke to [place] and she is going to call the patient and help talk her through her problem.

## 2016-05-27 ENCOUNTER — Ambulatory Visit: Payer: Self-pay | Admitting: *Deleted

## 2016-05-27 DIAGNOSIS — G4733 Obstructive sleep apnea (adult) (pediatric): Secondary | ICD-10-CM | POA: Diagnosis not present

## 2016-05-28 ENCOUNTER — Other Ambulatory Visit: Payer: Self-pay | Admitting: *Deleted

## 2016-05-28 NOTE — Patient Outreach (Signed)
[facility]) Care Management  05/28/2016  Shandee Rossman 1940-04-26 [id number]   Monthly follow up call; Telephone call to patient; HIPPA verification received:  Patient questioned regarding hypertension management. -states taking BP possibly every other day -states has not been recording BP's - last bp 172/? Cannot remember bottom number _ voices BP is okay; does not know target BP -cannot name blood pressures medication but states she is taking medication as prescribed. _can't remember last time she had appointment with primary care _states has appointment with heart MD next week. -uses [facility] to get to MD appointments  Plan: Encourage patient to record blood pressure readings. Encourage patient to ask MD at next appt what target BP goal.is Will follow care plan noted. Will follow up next month.  Sherrin Daisy, RN BSN [place] Management Coordinator [facility] Care Management  [phone]

## 2016-06-02 DIAGNOSIS — I10 Essential (primary) hypertension: Secondary | ICD-10-CM | POA: Diagnosis not present

## 2016-06-03 ENCOUNTER — Encounter (HOSPITAL_COMMUNITY): Payer: Self-pay

## 2016-06-03 ENCOUNTER — Ambulatory Visit (HOSPITAL_COMMUNITY)
Admission: RE | Admit: 2016-06-03 | Discharge: 2016-06-03 | Disposition: A | Discharge status: Home / Self Care | Payer: PPO | Source: Ambulatory Visit | Attending: Internal Medicine | Admitting: Internal Medicine

## 2016-06-03 VITALS — BP 125/75 | HR 71 | Wt 227.0 lb

## 2016-06-03 DIAGNOSIS — Z79899 Other long term (current) drug therapy: Secondary | ICD-10-CM | POA: Insufficient documentation

## 2016-06-03 DIAGNOSIS — Z87891 Personal history of nicotine dependence: Secondary | ICD-10-CM | POA: Insufficient documentation

## 2016-06-03 DIAGNOSIS — I2510 Atherosclerotic heart disease of native coronary artery without angina pectoris: Secondary | ICD-10-CM | POA: Insufficient documentation

## 2016-06-03 DIAGNOSIS — Z8673 Personal history of transient ischemic attack (TIA), and cerebral infarction without residual deficits: Secondary | ICD-10-CM | POA: Diagnosis not present

## 2016-06-03 DIAGNOSIS — Z9012 Acquired absence of left breast and nipple: Secondary | ICD-10-CM | POA: Diagnosis not present

## 2016-06-03 DIAGNOSIS — M109 Gout, unspecified: Secondary | ICD-10-CM | POA: Diagnosis not present

## 2016-06-03 DIAGNOSIS — I10 Essential (primary) hypertension: Secondary | ICD-10-CM

## 2016-06-03 DIAGNOSIS — Z9221 Personal history of antineoplastic chemotherapy: Secondary | ICD-10-CM | POA: Diagnosis not present

## 2016-06-03 DIAGNOSIS — I428 Other cardiomyopathies: Secondary | ICD-10-CM | POA: Insufficient documentation

## 2016-06-03 DIAGNOSIS — E669 Obesity, unspecified: Secondary | ICD-10-CM | POA: Insufficient documentation

## 2016-06-03 DIAGNOSIS — N183 Chronic kidney disease, stage 3 unspecified: Secondary | ICD-10-CM

## 2016-06-03 DIAGNOSIS — G4733 Obstructive sleep apnea (adult) (pediatric): Secondary | ICD-10-CM | POA: Diagnosis not present

## 2016-06-03 DIAGNOSIS — Z853 Personal history of malignant neoplasm of breast: Secondary | ICD-10-CM | POA: Insufficient documentation

## 2016-06-03 DIAGNOSIS — Z6841 Body Mass Index (BMI) 40.0 and over, adult: Secondary | ICD-10-CM | POA: Insufficient documentation

## 2016-06-03 DIAGNOSIS — I130 Hypertensive heart and chronic kidney disease with heart failure and stage 1 through stage 4 chronic kidney disease, or unspecified chronic kidney disease: Secondary | ICD-10-CM | POA: Diagnosis not present

## 2016-06-03 DIAGNOSIS — I5022 Chronic systolic (congestive) heart failure: Secondary | ICD-10-CM | POA: Insufficient documentation

## 2016-06-03 DIAGNOSIS — Z8249 Family history of ischemic heart disease and other diseases of the circulatory system: Secondary | ICD-10-CM | POA: Diagnosis not present

## 2016-06-03 DIAGNOSIS — Z7982 Long term (current) use of aspirin: Secondary | ICD-10-CM | POA: Diagnosis not present

## 2016-06-03 LAB — LIPID PANEL
CHOL/HDL RATIO: 2 ratio
Cholesterol: 112 mg/dL (ref 0–200)
HDL: 57 mg/dL (ref >40)
LDL Cholesterol: 46 mg/dL (ref 0–99)
Triglycerides: 43 mg/dL (ref <150)
VLDL: 9 mg/dL (ref 0–40)

## 2016-06-03 LAB — BASIC METABOLIC PANEL
ANION GAP: 9 (ref 5–15)
BUN: 22 mg/dL — ABNORMAL HIGH (ref 6–20)
CO2: 22 mmol/L (ref 22–32)
Calcium: 9.4 mg/dL (ref 8.9–10.3)
Chloride: 106 mmol/L (ref 101–111)
Creatinine, Ser: 1.64 mg/dL — ABNORMAL HIGH (ref 0.44–1.00)
GFR calc Af Amer: 34 mL/min — ABNORMAL LOW (ref >60)
GFR calc non Af Amer: 29 mL/min — ABNORMAL LOW (ref >60)
GLUCOSE: 103 mg/dL — AB (ref 65–99)
POTASSIUM: 4 mmol/L (ref 3.5–5.1)
Sodium: 137 mmol/L (ref 135–145)

## 2016-06-03 LAB — BRAIN NATRIURETIC PEPTIDE: B Natriuretic Peptide: 11.5 pg/mL (ref 0.0–100.0)

## 2016-06-03 NOTE — Progress Notes (Signed)
Patient ID: Tara Dunn, female   DOB: 06-19-1940, 76 y.o.   MRN: [id number]     Advanced Heart Failure Clinic Note   PCP: Dr. Ernie Hew HF: Dr. Aundra Dubin   76 yo with long history of HTN, CVAs, CKD, and systolic CHF.  She moved to [place] from [place] in 2015.   In 3/16, she was admitted a hypertensive emergency and acute CHF.  CTA chest showed no PE.  Echo showed EF 25-30% with moderate LVH.  She was diuresed and had a cardiac catheterization given extensive coronary calcification seen on CTA chest.  Cath showed diffuse moderate CAD, worst was 50-70% mRCA stenosis.   Cardiac MRI in 12/16 showed EF 32%.  ICD was placed in 2/17.    She returns for followup today.  She is short of breath walking < 50 yards. This seems fairly chronic.  She plans to start water aerobics at [facility] soon.  She has orthopnea => sleeps on 3 pillows.  No chest pain.   Somewhat confused about her meds.  Taking hydralazine only bid and has bottle of amlodipine that she is not sure whether or not she is taking (not on her list).     Optivol: Fluid index approaching threshold, impedance decreasing, no AF or VT.    Labs (3/16): K 4.2, creatinine 1.44, HCT 39.7 Labs (06/05/14): K 4.1, creatinine 2.01 Labs (5/16): K 4.7, creatinine 1.56 Labs (7/16): K 4.7, creatinine 1.68, BNP 14, LDL 62 Labs (11/16): K 4.2, creatinine 1.93 Labs (1/17): K 4.6, creatinine 1.65, HCT 39.2 Labs (4/17): K 4.5, creatinine 1.98 Labs (6/17): K 4, creatinine 2 Labs (8/17): K 4.4, creatinine 1.64   Labs (10/17): K 3.9, creatinine 1.5, BNP 15 Labs (1/18): K 4.3, creatinine 1.59  PMH: 1. CVA: 3 prior CVAs, last in 1998.  Thought to be related to HTN. 2. HTN: Long-standing, says she has been hypertensive since age 60. Renal artery dopplers (4/16) were normal.  3. CKD: Suspect related to HTN. 4. Breast cancer: 2009, left mastectomy with chemotherapy.  5. Gout 6. Obese 7. TAH 8. CAD: LHC (3/16) with moderate diffuse disease, worst lesion being  50-70% mid RCA.  9. Cardiomyopathy: Echo (3/16) with EF 25-30%, moderate LVH.  Echo (6/16) with EF 35-40%, moderate LVH, diffuse hypokinesis with septal-lateral dyssynchrony.  Cardiac MRI (12/16) with EF 32%, diffuse hypokinesis, no contrast given due to CKD.  - Medtronic ICD 2/17.  10. OSA: Using CPAP.  11. Event monitor (1/17) with NSR, occasional PVCs.   SH: From [place], moved to [place] in 2015 to be near daughter.  Single.  Quit smoking 2009.    FH: HTN runs in family.  Father with MI.   ROS: All systems reviewed and negative except as per HPI.   Current Outpatient Prescriptions  Medication Sig Dispense Refill  . allopurinol (ZYLOPRIM) 100 MG tablet Take 200 mg by mouth daily.   0  . aspirin EC 81 MG tablet Take 1 tablet (81 mg total) by mouth daily. 90 tablet 3  . atorvastatin (LIPITOR) 40 MG tablet Take 1 tablet (40 mg total) by mouth daily. 90 tablet 3  . carvedilol (COREG) 25 MG tablet Take 1 tablet (25 mg total) by mouth 2 (two) times daily. 180 tablet 3  . furosemide (LASIX) 20 MG tablet Take 1 tablet (20 mg total) by mouth daily. 90 tablet 3  . gabapentin (NEURONTIN) 300 MG capsule Take 300 mg by mouth 3 (three) times daily.    . hydrALAZINE (APRESOLINE) 50 MG  tablet Take 75 mg by mouth 2 (two) times daily.    . isosorbide mononitrate (IMDUR) 60 MG 24 hr tablet Take 1.5 tablets (90 mg total) by mouth daily. 270 tablet 3  . naproxen sodium (ANAPROX) 220 MG tablet Take 220 mg by mouth daily as needed (back pain). Reported on 08/26/2015    . omeprazole (PRILOSEC) 40 MG capsule Take 40 mg by mouth daily.  0  . sacubitril-valsartan (ENTRESTO) 49-51 MG Take 1 tablet by mouth 2 (two) times daily. 60 tablet 11  . spironolactone (ALDACTONE) 25 MG tablet Take 1 tablet (25 mg total) by mouth daily. 90 tablet 3  . traMADol (ULTRAM) 50 MG tablet Take 1 tablet (50 mg total) by mouth every 12 (twelve) hours as needed (twice daily as needed for pain). 60 tablet 0  . colchicine 0.6 MG tablet Take 0.6 mg  by mouth daily as needed (gouty flare).     No current facility-administered medications for this encounter.    BP 125/75   Pulse 71   Wt 227 lb (103 kg)   SpO2 99%   BMI 40.21 kg/m    Wt Readings from Last 3 Encounters:  06/03/16 227 lb (103 kg)  05/19/16 225 lb (102.1 kg)  04/06/16 218 lb 3.2 oz (99 kg)    General: Elderly appearing HEENT: Normal.  Neck: JVP 8 cm.  no thyromegaly or thyroid nodule.  Lungs: Clear, normal effort.  CV: Nondisplaced PMI.  Heart regular S1/S2, no S3/S4, no murmur. 1+ ankle edema. No carotid bruit.  Normal pedal pulses.  Abdomen: Soft, NT, ND, no HSM. No bruits or masses. +BS  Skin: Intact without lesions or rashes.  Neurologic: Alert and oriented x 3.  Psych: Normal affect. Extremities: No clubbing or cyanosis.   Assessment/Plan: 1. HTN: Has history of CVA and CKD.  Renal artery dopplers were normal.  BP now controlled => however, not sure if she is taking amlodipine or not.  For the time being, I will have her take this pill bottle out of her med bag and not use it. We will follow her BP => will have her seen in our pharmacy clinic in 1 week, can start amlodipine back if needed.  2. CAD: Moderate diffuse CAD, no severe obstruction.  This did not cause her cardiomyopathy.  No chest pain.  - Continue ASA 81 - Continue atorvastatin 40 mg daily, check lipids.    3. CKD III:  BMET today.  - Avoid NSAID use.   4. 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  EF has been persistently low, 32% by cardiac MRI in 12/16.  Unable to give contrast to assess for scar/infiltrative disease due to CKD.  She had Medtronic ICD placed in 2/17.  NYHA class III symptoms, mild volume overload by exam and Optivol suggests developing volume overload as well.  - Increase Lasix to 40 mg daily. BMET/BNP today and BMET again in 1 week.  - Increase hydralazine to 75 mg tid rather than bid.    - Continue imdur 90 mg daily - Continue spironolactone  25 mg daily.  - Continue Entresto 49/51 bid.  - Continue Coreg 25 mg bid.   - Repeat echo at followup in 1 month.  5. OSA: Uses CPAP.   6. Polypharmacy: Will enroll her in paramedicine program and have pharmacist go over meds with her today.   Followup in 1 week with pharmacist and 1 month with me.   Loralie Champagne,  06/03/2016

## 2016-06-03 NOTE — Patient Instructions (Addendum)
Stop Amlodipine  Increase Hydralazine to Three times a day   Increase Furosemide (Lasix) to 40 mg daily  Labs today  Your physician recommends that you schedule a follow-up appointment in: 1 week with Abbe Amsterdam D  Your physician recommends that you schedule a follow-up appointment in: 1 month with an echocardiogram

## 2016-06-03 NOTE — Addendum Note (Signed)
Encounter addended by: Larey Dresser, MD on: 06/03/2016 10:05 PM<BR>    Actions taken: Sign clinical note, Visit diagnoses modified, LOS modified

## 2016-06-03 NOTE — Progress Notes (Signed)
Patient referred to the [facility]. I sent the appropriate forms via secure email to the [employer].

## 2016-06-04 ENCOUNTER — Telehealth: Payer: Self-pay

## 2016-06-04 DIAGNOSIS — I10 Essential (primary) hypertension: Secondary | ICD-10-CM | POA: Diagnosis not present

## 2016-06-04 DIAGNOSIS — I509 Heart failure, unspecified: Secondary | ICD-10-CM | POA: Diagnosis not present

## 2016-06-04 DIAGNOSIS — N19 Unspecified kidney failure: Secondary | ICD-10-CM | POA: Diagnosis not present

## 2016-06-04 DIAGNOSIS — I639 Cerebral infarction, unspecified: Secondary | ICD-10-CM | POA: Diagnosis not present

## 2016-06-09 ENCOUNTER — Ambulatory Visit (HOSPITAL_COMMUNITY)
Admission: RE | Admit: 2016-06-09 | Discharge: 2016-06-09 | Disposition: A | Discharge status: Home / Self Care | Payer: PPO | Source: Ambulatory Visit | Attending: Internal Medicine | Admitting: Internal Medicine

## 2016-06-09 ENCOUNTER — Ambulatory Visit (HOSPITAL_COMMUNITY): Payer: PPO

## 2016-06-09 VITALS — BP 138/85 | HR 69 | Wt 223.0 lb

## 2016-06-09 DIAGNOSIS — I130 Hypertensive heart and chronic kidney disease with heart failure and stage 1 through stage 4 chronic kidney disease, or unspecified chronic kidney disease: Secondary | ICD-10-CM | POA: Diagnosis not present

## 2016-06-09 DIAGNOSIS — G4733 Obstructive sleep apnea (adult) (pediatric): Secondary | ICD-10-CM | POA: Diagnosis not present

## 2016-06-09 DIAGNOSIS — I5022 Chronic systolic (congestive) heart failure: Secondary | ICD-10-CM | POA: Diagnosis not present

## 2016-06-09 DIAGNOSIS — I2510 Atherosclerotic heart disease of native coronary artery without angina pectoris: Secondary | ICD-10-CM | POA: Insufficient documentation

## 2016-06-09 DIAGNOSIS — N183 Chronic kidney disease, stage 3 (moderate): Secondary | ICD-10-CM | POA: Diagnosis not present

## 2016-06-09 LAB — BASIC METABOLIC PANEL
ANION GAP: 8 (ref 5–15)
BUN: 26 mg/dL — ABNORMAL HIGH (ref 6–20)
CO2: 24 mmol/L (ref 22–32)
Calcium: 10 mg/dL (ref 8.9–10.3)
Chloride: 108 mmol/L (ref 101–111)
Creatinine, Ser: 1.55 mg/dL — ABNORMAL HIGH (ref 0.44–1.00)
GFR calc Af Amer: 36 mL/min — ABNORMAL LOW (ref >60)
GFR calc non Af Amer: 31 mL/min — ABNORMAL LOW (ref >60)
Glucose, Bld: 95 mg/dL (ref 65–99)
POTASSIUM: 4.6 mmol/L (ref 3.5–5.1)
SODIUM: 140 mmol/L (ref 135–145)

## 2016-06-09 MED ORDER — FUROSEMIDE 20 MG PO TABS: 40.0000 mg | ORAL_TABLET | Freq: Every day | ORAL | 5 refills | Status: DC

## 2016-06-09 MED ORDER — SACUBITRIL-VALSARTAN 97-103 MG PO TABS: 1.0000 | ORAL_TABLET | Freq: Two times a day (BID) | ORAL | 5 refills | Status: DC

## 2016-06-09 MED ORDER — ISOSORB DINITRATE-HYDRALAZINE 20-37.5 MG PO TABS: 2.0000 | ORAL_TABLET | Freq: Three times a day (TID) | ORAL | 5 refills | Status: DC

## 2016-06-09 NOTE — Progress Notes (Signed)
HF MD: [facility]  HPI:  76 yo AA female with long history of HTN, CVAs, CKD, and systolic CHF.  She moved to [place] from [place] in 2015.   In 3/16, she was admitted a hypertensive emergency and acute CHF.  CTA chest showed no PE.  Echo showed EF 25-30% with moderate LVH.  She was diuresed and had a cardiac catheterization given extensive coronary calcification seen on CTA chest.  Cath showed diffuse moderate CAD, worst was 50-70% mRCA stenosis.   Cardiac MRI in 12/16 showed EF 32%.  ICD was placed in 2/17.    She returns for pharmacist-led HF medication titration. At last HF clinic visit on 06/03/16, her amlodipine was discontinued, her hydralazine was increased to TID and her furosemide was increased to 40 mg daily. She is doing well today except her back is hurting her so she is not able to exercise much. She did start water aerobics yesterday at the [facility]. She is still somewhat confused about her meds and did not bring them in today but Karena Addison Journalist, newspaper) will be coming to her home tomorrow and all changes will be relayed to her.      . Shortness of breath/dyspnea on exertion? no  . Orthopnea/PND? no . Edema? Yes - 1+ but improved from last week  . Lightheadedness/dizziness? no . Daily weights at home? Yes stable ~220 lb  . Blood pressure/heart rate monitoring at home? Yes - can't remember . Following low-sodium/fluid-restricted diet? Yes - stays away from salty/fried foods; drinks <2L/day  HF Medications: Carvedilol 25 mg PO BID Furosemide 40 mg PO daily Hydralazine 75 mg PO TID Isosorbide mononitrate 90 mg PO daily Entresto 49-51 mg PO BID  Spironolactone 25 mg PO daily   Has the patient been experiencing any side effects to the medications prescribed?  no  Does the patient have any problems obtaining medications due to transportation or finances?   no  Understanding of regimen: fair Understanding of indications: fair Potential of compliance: good Patient understands to  avoid NSAIDs. Patient understands to avoid decongestants.    Pertinent Lab Values: . 06/09/16: Serum creatinine 1.55 (BL ~1.4-1.6), BUN 26, Potassium 4.6, Sodium 140 . 06/03/16: BNP 11  Vital Signs: . Weight: 223 lb (dry weight: 218-220 lb) . Blood pressure: 138/85 mmHg   . Heart rate: 69 bpm    Assessment: 1. Chronicsystolic CHF (EF 08-65% 08/8467), due to NICM (likely HTN). NYHA class IIIsymptoms although difficult to assess currently with her back pain.  - Volume status improved since last week with increase in lasix  - Continue Lasix at 40 mg daily, carvedilol 25 mg bid, spironolactone 25 mg daily  - Switch hydralazine and isosorbide to Bidil 2 tablets TID to reduce the amount of medications she is taking  - Increase Entresto to goal 97-103 mg BID - Basic disease state pathophysiology, medication indication, mechanism and side effects reviewed at length with patient and he verbalized understanding 2. HTN  - BP still above goal <130/90 mmHg  - Increase Entresto as above  3. CAD (moderate diffuse CAD, no severe obstruction.  This did not cause her cardiomyopathy)  - No chest pain.   - Continue ASA 81  - Continue atorvastatin 40 mg daily, LP on 4/26 at goal - LDL 46, HDL 57, TG 43 4. CKD III  - SCr stable ~1.4-1.6  - Discussed avoiding NSAID use. Was taking naproxen about 1x per week for back pain. I have advised her to see her PCP for Rx pain  medication that would be safer for kidneys and BP 5. OSA  - Compliant with CPAP   Plan: 1) Medication changes: Based on clinical presentation, vital signs and recent labs will switch hydralazine and isosorbide mononitrate to Bidil 2 tabs TID and increase Entresto to 97-103 mg BID 2) Labs: BMET today 3) Follow-up: Dr. Aundra Dubin with ECHO on 07/08/16   Doroteo Bradford K. [name], PharmD, BCPS, CPP Clinical Pharmacist Pager: [phone] Phone: [phone] 06/09/2016 10:57 AM

## 2016-06-09 NOTE — Patient Instructions (Addendum)
It was great to see you today!  Please STOP your HYDRALAZINE AND ISOSORBIDE MONONITRATE.  Instead START BIDIL 2 tablets THREE TIMES DAILY.   Please INCREASE ENTRESTO to 97-103 mg TWICE DAILY. You may take 2 tablets twice daily of your current Entresto 49-51 mg twice daily.   Please keep your appointment with Dr. Aundra Dubin on 07/08/16.

## 2016-06-10 ENCOUNTER — Telehealth (HOSPITAL_COMMUNITY): Payer: Self-pay | Admitting: Pharmacist

## 2016-06-10 ENCOUNTER — Other Ambulatory Visit (HOSPITAL_COMMUNITY): Payer: Self-pay

## 2016-06-10 NOTE — Progress Notes (Signed)
Paramedicine Encounter    Patient ID: Tara Dunn, female    DOB: 17-Jul-1940, 76 y.o.   MRN: [id number]    Patient Care Team: Fanny Bien, MD as PCP - General (Family Medicine) Dickie La, RN as [place] Management  Patient Active Problem List   Diagnosis Date Noted  . Joint pain 12/09/2015  . ICD (implantable cardioverter-defibrillator) in place 03/26/2015  . Syncope 03/06/2015  . Obesity (BMI 30-39.9) 01/21/2015  . Excessive daytime sleepiness 08/04/2014  . OSA (obstructive sleep apnea) 08/04/2014  . Essential hypertension 06/13/2014  . Chronic systolic CHF (congestive heart failure) ([place]) 05/20/2014  . Congestive heart disease ([place])   . CKD (chronic kidney disease), stage III 05/03/2014  . Hx of stroke without residual deficits 05/03/2014    Current Outpatient Prescriptions:  .  allopurinol (ZYLOPRIM) 100 MG tablet, Take 200 mg by mouth daily. , Disp: , Rfl: 0 .  aspirin EC 81 MG tablet, Take 1 tablet (81 mg total) by mouth daily., Disp: 90 tablet, Rfl: 3 .  atorvastatin (LIPITOR) 40 MG tablet, Take 1 tablet (40 mg total) by mouth daily., Disp: 90 tablet, Rfl: 3 .  carvedilol (COREG) 25 MG tablet, Take 1 tablet (25 mg total) by mouth 2 (two) times daily., Disp: 180 tablet, Rfl: 3 .  furosemide (LASIX) 20 MG tablet, Take 2 tablets (40 mg total) by mouth daily., Disp: 60 tablet, Rfl: 5 .  gabapentin (NEURONTIN) 300 MG capsule, Take 300 mg by mouth 3 (three) times daily., Disp: , Rfl:  .  omeprazole (PRILOSEC) 40 MG capsule, Take 40 mg by mouth daily., Disp: , Rfl: 0 .  sacubitril-valsartan (ENTRESTO) 97-103 MG, Take 1 tablet by mouth 2 (two) times daily., Disp: 60 tablet, Rfl: 5 .  spironolactone (ALDACTONE) 25 MG tablet, Take 1 tablet (25 mg total) by mouth daily., Disp: 90 tablet, Rfl: 3 .  colchicine 0.6 MG tablet, Take 0.6 mg by mouth daily as needed (gouty flare)., Disp: , Rfl:  .  isosorbide-hydrALAZINE (BIDIL) 20-37.5 MG tablet, Take 2  tablets by mouth 3 (three) times daily., Disp: 180 tablet, Rfl: 5 .  naproxen sodium (ANAPROX) 220 MG tablet, Take 220 mg by mouth daily as needed (back pain). Reported on 08/26/2015, Disp: , Rfl:  .  traMADol (ULTRAM) 50 MG tablet, Take 1 tablet (50 mg total) by mouth every 12 (twelve) hours as needed (twice daily as needed for pain). (Patient not taking: Reported on 06/09/2016), Disp: 60 tablet, Rfl: 0 Allergies  Allergen Reactions  . Shellfish Allergy Hives     Social History   Social History  . Marital status: Widowed    Spouse name: N/A  . Number of children: N/A  . Years of education: N/A   Occupational History  . Not on file.   Social History Main Topics  . Smoking status: Former Smoker    Packs/day: 0.50    Years: 40.00    Types: Cigarettes  . Smokeless tobacco: Never Used     Comment: Quit in 2009.  Marland Kitchen Alcohol use No     Comment: "drank occasionally when I was young; last drink was in the late 1990s"  . Drug use: No  . Sexual activity: No   Other Topics Concern  . Not on file   Social History Narrative   Lives alone in an independent living facility.  Education: 10th grade.     Retired from [employer] work.  Moved here from [place] in 2015.    Physical  Exam  Eyes: Pupils are equal, round, and reactive to light.  Abdominal: She exhibits no distension. There is no tenderness. There is no guarding.  Musculoskeletal: She exhibits edema.  Skin: Skin is warm and dry.        SAFE - 06/10/16 1400      Situation   Heart failure history Exisiting   Comorbidities CVA;HTN;Hx MI/CAD   Target Weight 190 lbs     Assessment   Lives alone Yes   Primary support person faith gillespie daughter   Mode of transportation family/friends   Other services involved None   Home equipement Cane;Scale;Walker     Weight   Weighs self daily No   Scale provided Yes   Records on weight chart No     Resources   Has "Living better w/heart failure" book No   Has HF Zone tool No   Able to  identify yellow zone signs/when to call MD No   Records zone daily No     Medications   Uses a pill box Yes   Who stocks the pill box chp   Pill box checked this visit Yes   Pill box refilled this visit Yes   Difficulty obtaining medications No   Mail order medications No   Missed one or more doses of medications per week No     Nutrition   Patient receives meals on wheels No   Patient follows low sodium diet No   Has foods at home that meet the current recommended diet Yes   Patient follows low sugar/card diet No   Nutritional concerns/issues yes     Activity Level   ADL's/Mobility Independent   How many feet can patient ambulate 30   Barriers defecits due to previous stroke     Urine   Difficulty urinating No   Changes in urine None     Time spent with patient   Time spent with patient  [place] - 06/10/16 1700      Outside of House   Sidewalk and pathway to house is level and free from any hazards Yes   Driveway is free from debris/snow/ice Yes   Outside stairs are stable and have sturdy handrail N/A   Porch lights are working and provide adequate lighting Yes     Living Room   Furniture is of adequate height and offers arm rests that assist in getting up and down Yes   Floor is free from any clutter that would create tripping hazards Yes   All cords are either behind furniture or secured in a manner that does not cause trip hazards Yes   All rugs are secured to floor with double-sided tape Yes   Lighting is adequate to light room Yes   All lighting has an easily accessible on/off switch Yes   Phone is readily accessible near favorite seating areas Yes   Emergency numbers are printed near all phones in house No     Kitchen   Items used most often are within easy reach on low shelves Yes   Step stool is present, is sturdy and has a handrail No   Floor mats are non-slip tread and secured to floor Yes   Oven  controls are within easy reach Yes   Kitchen lighting is adequate and easy to reach switches Yes   ABC fire extinguisher is located in kitchen Yes     Stairs  Carpet is properly secured to stairs and/or all wood is properly secured Yes   Handrail is present and sturdy No       Future Appointments Date Time Provider [place]  07/08/2016 11:00 AM MC ECHO 1-BUZZ [facility]ECHOLAB [facility]  07/08/2016 12:00 PM MC-HVSC CLINIC MC-HVSC None  08/18/2016 8:50 AM CVD-CHURCH DEVICE REMOTES [facility]    ATF [name] x4 with no complaints.  Pt was advised on the role of the CHP in her treatment plan with the heart clinic.  Pt stated that she was using the pill box before but hasn't filled it in a long time.  Pt has pills in her bag and several bottles (10)in her bedroom.  I showed pt which pills were discontinued and expired.  I took those medications with me to dispose of at [facility] due to pt having questionable confusion.  Pt was shown how to use the pill box.  Pt denies sob, headache, and chest pain. Pt that she bakes her meats but using "shake and bake".  I advised pt that shake and bake is not a good choice due to the sodium content. Pt was also advised to keep her fluid intake under 2L. Pt stated that she tries to stay active. She goes to [employer] and shopping.  Pt's daughter is her main support person (faith gillespie). Pt can not find the heart failure information and she was not aware of the dangers zones.  I advised pt what they were.  I will get pt information from the heart clinic to place in eye sight.  Pt agreed to begin to weigh herself daily and record her readings.    Doroteo Bradford able to get pt approved for free entresto/bidil   **rx called in: Allopurinol ( filled Monday morn -1 pill)  BP 132/82 (BP Location: Right Arm, Patient Position: Sitting, Cuff Size: Large)   Pulse 70   Resp 16   Wt 223 lb 9.6 oz (101.4 kg)   SpO2 98%   BMI 39.61 kg/m   Weight yesterday-didn't  weigh Last visit weight-227  Rayetta Veith, EMT Paramedic 06/10/2016    ACTION: Home visit completed Next visit planned for next friday

## 2016-06-10 NOTE — Telephone Encounter (Signed)
Dee Journalist, newspaper) stated that Ms. Liles Entresto and Bidil were ~$80 each and she cannot afford these. She is already enrolled in the PAN foundation so I relayed the info again to [employer] and they were able to process both for no charge to her. Relayed this to [facility] who will let Ms. Strick know.   [name]. [name], PharmD, BCPS, CPP Clinical Pharmacist Pager: [phone] Phone: [phone] 06/10/2016 2:11 PM

## 2016-06-14 ENCOUNTER — Encounter: Payer: Self-pay | Admitting: *Deleted

## 2016-06-14 ENCOUNTER — Other Ambulatory Visit: Payer: Self-pay | Admitting: *Deleted

## 2016-06-14 NOTE — Patient Outreach (Signed)
[facility]) Care Management  06/14/2016  Tara Dunn July 11, 1940 [id number]  Follow up call to patient; HIPPA verification received from patient.   Patient voices that she has attended primary care office visit in April 2018 & cardiology office visit in May 2018. States she forgot to ask what target blood pressure should be but states it has come down in last several months.  States she is taking blood pressure 3-4 times weekly. States she is not remembering to write the numbers downs each time. Patient encouraged to use calendar/planner that was sent to her in March. States she will try to find it & use. Patient voices she does have memory problem.   Patient voices that she is taking medication as prescribed. States she has someone preparing her medication box for her.   Voices that she is getting to MD appointments by using [facility] or her daughter takes her.   Patient voices that she has fallen 1 time in the last month but did not get hurt. States she was not using her walker & was not at home. Patient encouraged to use walker for support as needed. Patient voiced understanding. .  Patient advised that [facility] case will be closed. No further care coordination needs assessed.  Plan: Care plan updated as noted.   Send reminder information on HTN, Falls Prevention as a review & means of reenforcement  (pt has received information previously).  Send MD closure letter.  Sherrin Daisy, RN BSN [place] Management Coordinator [facility] Care Management  [phone]

## 2016-06-18 ENCOUNTER — Other Ambulatory Visit (HOSPITAL_COMMUNITY): Payer: Self-pay

## 2016-06-18 NOTE — Progress Notes (Signed)
Paramedicine Encounter    Patient ID: Tara Dunn, female    DOB: 09/08/1940, 76 y.o.   MRN: [id number]    Patient Care Team: Fanny Bien, MD as PCP - General (Family Medicine)  Patient Active Problem List   Diagnosis Date Noted  . Joint pain 12/09/2015  . ICD (implantable cardioverter-defibrillator) in place 03/26/2015  . Syncope 03/06/2015  . Obesity (BMI 30-39.9) 01/21/2015  . Excessive daytime sleepiness 08/04/2014  . OSA (obstructive sleep apnea) 08/04/2014  . Essential hypertension 06/13/2014  . Chronic systolic CHF (congestive heart failure) ([place]) 05/20/2014  . Congestive heart disease ([place])   . CKD (chronic kidney disease), stage III 05/03/2014  . Hx of stroke without residual deficits 05/03/2014    Current Outpatient Prescriptions:  .  aspirin EC 81 MG tablet, Take 1 tablet (81 mg total) by mouth daily., Disp: 90 tablet, Rfl: 3 .  atorvastatin (LIPITOR) 40 MG tablet, Take 1 tablet (40 mg total) by mouth daily., Disp: 90 tablet, Rfl: 3 .  carvedilol (COREG) 25 MG tablet, Take 1 tablet (25 mg total) by mouth 2 (two) times daily., Disp: 180 tablet, Rfl: 3 .  furosemide (LASIX) 20 MG tablet, Take 2 tablets (40 mg total) by mouth daily., Disp: 60 tablet, Rfl: 5 .  gabapentin (NEURONTIN) 300 MG capsule, Take 300 mg by mouth 3 (three) times daily., Disp: , Rfl:  .  isosorbide-hydrALAZINE (BIDIL) 20-37.5 MG tablet, Take 2 tablets by mouth 3 (three) times daily., Disp: 180 tablet, Rfl: 5 .  omeprazole (PRILOSEC) 40 MG capsule, Take 40 mg by mouth daily., Disp: , Rfl: 0 .  sacubitril-valsartan (ENTRESTO) 97-103 MG, Take 1 tablet by mouth 2 (two) times daily., Disp: 60 tablet, Rfl: 5 .  spironolactone (ALDACTONE) 25 MG tablet, Take 1 tablet (25 mg total) by mouth daily., Disp: 90 tablet, Rfl: 3 .  allopurinol (ZYLOPRIM) 100 MG tablet, Take 200 mg by mouth daily. , Disp: , Rfl: 0 .  colchicine 0.6 MG tablet, Take 0.6 mg by mouth daily as needed (gouty flare)., Disp: , Rfl:   .  naproxen sodium (ANAPROX) 220 MG tablet, Take 220 mg by mouth daily as needed (back pain). Reported on 08/26/2015, Disp: , Rfl:  .  traMADol (ULTRAM) 50 MG tablet, Take 1 tablet (50 mg total) by mouth every 12 (twelve) hours as needed (twice daily as needed for pain). (Patient not taking: Reported on 06/09/2016), Disp: 60 tablet, Rfl: 0 Allergies  Allergen Reactions  . Shellfish Allergy Hives     Social History   Social History  . Marital status: Widowed    Spouse name: N/A  . Number of children: N/A  . Years of education: N/A   Occupational History  . Not on file.   Social History Main Topics  . Smoking status: Former Smoker    Packs/day: 0.50    Years: 40.00    Types: Cigarettes  . Smokeless tobacco: Never Used     Comment: Quit in 2009.  Marland Kitchen Alcohol use No     Comment: "drank occasionally when I was young; last drink was in the late 1990s"  . Drug use: No  . Sexual activity: No   Other Topics Concern  . Not on file   Social History Narrative   Lives alone in an independent living facility.  Education: 10th grade.     Retired from [employer] work.  Moved here from [place] in 2015.    Physical Exam  Eyes: Pupils are equal, round, and reactive to  light.  Pulmonary/Chest: No respiratory distress. She has no wheezes. She has no rales.  Abdominal: She exhibits no distension. There is no tenderness. There is no guarding.  Musculoskeletal: She exhibits edema.  Skin: Skin is warm and dry.        SAFE - 06/10/16 1400      Situation   Heart failure history Exisiting   Comorbidities CVA;HTN;Hx MI/CAD   Target Weight 190 lbs     Assessment   Lives alone Yes   Primary support person faith gillespie daughter   Mode of transportation family/friends   Other services involved None   Home equipement Cane;Scale;Walker     Weight   Weighs self daily No   Scale provided Yes   Records on weight chart No     Resources   Has "Living better w/heart failure" book No   Has HF Zone  tool No   Able to identify yellow zone signs/when to call MD No   Records zone daily No     Medications   Uses a pill box Yes   Who stocks the pill box chp   Pill box checked this visit Yes   Pill box refilled this visit Yes   Difficulty obtaining medications No   Mail order medications No   Missed one or more doses of medications per week No     Nutrition   Patient receives meals on wheels No   Patient follows low sodium diet No   Has foods at home that meet the current recommended diet Yes   Patient follows low sugar/card diet No   Nutritional concerns/issues yes     Activity Level   ADL's/Mobility Independent   How many feet can patient ambulate 30   Barriers defecits due to previous stroke     Urine   Difficulty urinating No   Changes in urine None     Time spent with patient   Time spent with patient  60 Minutes        Future Appointments Date Time Provider [place]  07/08/2016 11:00 AM MC ECHO 1-BUZZ [facility]  07/08/2016 12:00 PM Nara Visa MC-HVSC None  08/18/2016 8:50 AM [facility] DEVICE REMOTES [facility]    ATF [name] x4 sitting in her living room. [facility].she reports that she fell tuesday while trying to pick up her mail that she dropped and ems was called to assist in getting her up.  Her left knee is sore from her trying to get herself up from the floor.  Pt took all of her meds with no issues this week. She state that she ate lima beans and baked chicken last night for dinner, coffee and water throughout the day.  Pt denies sob, dizziness, headache and chest pain. Pt was advised how to prepare Pt stated that she cooks her own food and just left the grocery store. Pt has no problems sleeping at night and no issues urinating. rx bottles verified and pill box refilled.    **pt is still out of allopurinol  BP 138/76 (BP Location: Right Arm, Patient Position: Sitting, Cuff  Size: Large)   Pulse 69   Resp 16   Wt 233 lb 6.4 oz (105.9 kg)   SpO2 96%   BMI 41.34 kg/m   Weight yesterday-220.3 Last visit weight-223    [name], EMT Paramedic 06/18/2016    ACTION: Home visit completed

## 2016-06-18 NOTE — Progress Notes (Signed)
[place] Report   Patient Details  Name: Tara Dunn MRN: [id number] Date of Birth: Jan 14, 1941 Age: 76 y.o. PCP: Fanny Bien, MD  Vitals:   06/18/16 1201  BP: (!) 158/98  Pulse: 61  Resp: 18  SpO2: 97%  Weight: 223 lb 12.8 oz (101.5 kg)         [facility] Eval - 06/18/16 1200      Referral    Referring Provider Self   Reason for referral Family History;Heart Failure;High Cholesterol;Hypertension;Inactivity;Obesitity/Overweight;Orthopedic;Stroke   Program Start Date 06/18/16     Measurement   Waist Circumference 41.5 inches   Hip Circumference 47 inches   Body fat --  didn't register     Information for Trainer   Goals "I want to be able to walk w/o my walker and do more things"   Current Exercise Water class Tu/Th   Orthopedic Concerns bilat knees-arthritis/weak back   Pertinent Medical History Stroke, chf, sleep apnea,    Current Barriers doesn't drive (uses [facility])   Restrictions/Precautions Fall risk;Assistive device     Timed Up and Go (TUGS)   Timed Up and Go High risk >13 seconds  31.7secs     Mobility and Daily Activities   I find it easy to walk up or down two or more flights of stairs. 1   I have no trouble taking out the trash. 1   I do housework such as vacuuming and dusting on my own without difficulty. 2   I can easily lift a gallon of milk (8lbs). 3   I can easily walk a mile. 1   I have no trouble reaching into high cupboards or reaching down to pick up something from the floor. 3   I do not have trouble doing out-door work such as Armed forces logistics/support/administrative officer, raking leaves, or gardening. 1     Mobility and Daily Activities   I feel younger than my age. 2   I feel independent. 2   I feel energetic. 2   I live an active life.  2   I feel strong. 1   I feel healthy. 3   I feel active as other people my age. 2     How fit and strong are you.   Fit and Strong Total Score 26     Past Medical History:  Diagnosis Date  .  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 Lovena Le  . Aortic dilatation ([place])    a. 04/2014 CTA chest w/ incidental finding of distal thoracic Ao enlargement of 3.39 mm - f/u needed 04/2015.  . Arthritis    "all over my body"  . Cancer of left breast ([place]) 2009   s/p L mastectomy and chemo.  . Cardiomyopathy ([place])    a. 04/2014 Echo: EF 25-30%, mod conc LVH, possibl antsept HK, Gr1 DD, mod-sev dil LA.  . CHF (congestive heart failure) ([place])   . CKD (chronic kidney disease), stage III   . Depression   . GERD (gastroesophageal reflux disease)   . Gout   . Heart murmur   . History of stomach ulcers   . Hypertension    a. Dx @ age 63;  b. 04/2014 admission for HTN emergency.  . Obesity (BMI 30-39.9) 01/21/2015  . OSA on CPAP   . Stroke [facility])    a. 3 strokes - last in 1998; "memory problems since"  (03/26/2015)   Past Surgical History:  Procedure Laterality Date  .  ABDOMINAL HYSTERECTOMY    . BREAST BIOPSY Left 2008  . CATARACT EXTRACTION, BILATERAL Bilateral   . DILATION AND CURETTAGE OF UTERUS    . EP IMPLANTABLE DEVICE N/A 03/26/2015   MDT single chamber ICD, Dr. Lovena Le  . LEFT HEART CATHETERIZATION WITH CORONARY ANGIOGRAM N/A 05/07/2014   Procedure: LEFT HEART CATHETERIZATION WITH CORONARY ANGIOGRAM;  Surgeon: Belva Crome, MD;  Location: [facility] CATH LAB;  Service: Cardiovascular;  Laterality: N/A;  . MASTECTOMY Left 2009   History  Smoking Status  . Former Smoker  . Packs/day: 0.50  . Years: 40.00  . Types: Cigarettes  Smokeless Tobacco  . Never Used    Comment: Quit in 2009.     Tara Dunn's preferred day for training is Monday, Tuesday, Wednesday, Thursday and Friday and preferred time is Morning (9AM -12PM).  She is open, just needs a few days notice to arrange for [facility] to pickup and drop off at home.      Tara Dunn 06/18/2016, 12:10 PM

## 2016-06-23 ENCOUNTER — Other Ambulatory Visit (HOSPITAL_COMMUNITY): Payer: Self-pay

## 2016-06-23 NOTE — Progress Notes (Signed)
Paramedicine Encounter    Patient ID: Tara Dunn, female    DOB: 06-21-1940, 76 y.o.   MRN: [id number]    Patient Care Team: Fanny Bien, MD as PCP - General (Family Medicine)  Patient Active Problem List   Diagnosis Date Noted  . Joint pain 12/09/2015  . ICD (implantable cardioverter-defibrillator) in place 03/26/2015  . Syncope 03/06/2015  . Obesity (BMI 30-39.9) 01/21/2015  . Excessive daytime sleepiness 08/04/2014  . OSA (obstructive sleep apnea) 08/04/2014  . Essential hypertension 06/13/2014  . Chronic systolic CHF (congestive heart failure) ([place]) 05/20/2014  . Congestive heart disease ([place])   . CKD (chronic kidney disease), stage III 05/03/2014  . Hx of stroke without residual deficits 05/03/2014    Current Outpatient Prescriptions:  .  aspirin EC 81 MG tablet, Take 1 tablet (81 mg total) by mouth daily., Disp: 90 tablet, Rfl: 3 .  atorvastatin (LIPITOR) 40 MG tablet, Take 1 tablet (40 mg total) by mouth daily., Disp: 90 tablet, Rfl: 3 .  carvedilol (COREG) 25 MG tablet, Take 1 tablet (25 mg total) by mouth 2 (two) times daily., Disp: 180 tablet, Rfl: 3 .  furosemide (LASIX) 20 MG tablet, Take 2 tablets (40 mg total) by mouth daily., Disp: 60 tablet, Rfl: 5 .  gabapentin (NEURONTIN) 300 MG capsule, Take 300 mg by mouth 3 (three) times daily., Disp: , Rfl:  .  isosorbide-hydrALAZINE (BIDIL) 20-37.5 MG tablet, Take 2 tablets by mouth 3 (three) times daily., Disp: 180 tablet, Rfl: 5 .  omeprazole (PRILOSEC) 40 MG capsule, Take 40 mg by mouth daily., Disp: , Rfl: 0 .  sacubitril-valsartan (ENTRESTO) 97-103 MG, Take 1 tablet by mouth 2 (two) times daily., Disp: 60 tablet, Rfl: 5 .  spironolactone (ALDACTONE) 25 MG tablet, Take 1 tablet (25 mg total) by mouth daily., Disp: 90 tablet, Rfl: 3 .  allopurinol (ZYLOPRIM) 100 MG tablet, Take 200 mg by mouth daily. , Disp: , Rfl: 0 .  colchicine 0.6 MG tablet, Take 0.6 mg by mouth daily as needed (gouty flare)., Disp: , Rfl:   .  naproxen sodium (ANAPROX) 220 MG tablet, Take 220 mg by mouth daily as needed (back pain). Reported on 08/26/2015, Disp: , Rfl:  .  traMADol (ULTRAM) 50 MG tablet, Take 1 tablet (50 mg total) by mouth every 12 (twelve) hours as needed (twice daily as needed for pain). (Patient not taking: Reported on 06/09/2016), Disp: 60 tablet, Rfl: 0 Allergies  Allergen Reactions  . Shellfish Allergy Hives     Social History   Social History  . Marital status: Widowed    Spouse name: N/A  . Number of children: N/A  . Years of education: N/A   Occupational History  . Not on file.   Social History Main Topics  . Smoking status: Former Smoker    Packs/day: 0.50    Years: 40.00    Types: Cigarettes  . Smokeless tobacco: Never Used     Comment: Quit in 2009.  Marland Kitchen Alcohol use No     Comment: "drank occasionally when I was young; last drink was in the late 1990s"  . Drug use: No  . Sexual activity: No   Other Topics Concern  . Not on file   Social History Narrative   Lives alone in an independent living facility.  Education: 10th grade.     Retired from [employer] work.  Moved here from [place] in 2015.    Physical Exam      SAFE - 06/10/16 1400  Situation   Heart failure history Exisiting   Comorbidities CVA;HTN;Hx MI/CAD   Target Weight 190 lbs     Assessment   Lives alone Yes   Primary support person faith gillespie daughter   Mode of transportation family/friends   Other services involved None   Home equipement Cane;Scale;Walker     Weight   Weighs self daily No   Scale provided Yes   Records on weight chart No     Resources   Has "Living better w/heart failure" book No   Has HF Zone tool No   Able to identify yellow zone signs/when to call MD No   Records zone daily No     Medications   Uses a pill box Yes   Who stocks the pill box chp   Pill box checked this visit Yes   Pill box refilled this visit Yes   Difficulty obtaining medications No   Mail order medications  No   Missed one or more doses of medications per week No     Nutrition   Patient receives meals on wheels No   Patient follows low sodium diet No   Has foods at home that meet the current recommended diet Yes   Patient follows low sugar/card diet No   Nutritional concerns/issues yes     Activity Level   ADL's/Mobility Independent   How many feet can patient ambulate 30   Barriers defecits due to previous stroke     Urine   Difficulty urinating No   Changes in urine None     Time spent with patient   Time spent with patient  60 Minutes        Future Appointments Date Time Provider [place]  07/08/2016 11:00 AM MC ECHO 1-BUZZ [facility]OL[facility]  07/08/2016 12:00 PM MC-HVSC CLINIC MC-HVSC None  08/18/2016 8:50 AM CVD-[facility] DEVICE REMOTES [facility]    ATF [name] x4 c/o left leg pain. Pt fell while getting out of the shower yesterday and has a large bruise on her left thigh.  Pt is able to bare weight on her leg but she rates her pain @ 6 on pain scale. Pt denies any other pain associated with the fall, she denies hitting her head.  [facility] [employer] [facility] including today. Pt has weighing herself daily and recording her weights.  Pt had a weight increase over night, which she told me that she ate fruit,  2 cups of coffee, and water yesterday.  Pt didn't eat dinner because she was too tired after [facility].  Pt has no sob, no dizziness, no headache and no chest pain.  Pt hasn't missed any medications throughout this week.    Pt's med list has allunprinol listed however, when I called to verify with her pharmacy, pt has not picked up the rx in over a year.  I called her primary physician Dr. Ernie Hew to clarify, the nurse stated that she would call me back. The prescription was writen for her to take for 6 months.  Pt rx bottles verified and pill box refilled.    BP (!) 156/88 (BP Location: Right Arm, Patient Position: Sitting, Cuff  Size: Large)   Pulse 65   Resp 16   Wt 221 lb 9.6 oz (100.5 kg)   SpO2 96%   BMI 39.25 kg/m   Weight yesterday-219.8 Last visit weight-223    Faiza Bansal, EMT Paramedic 06/23/2016  ACTION: Home visit completed Next visit planned for next wednesday

## 2016-06-23 NOTE — Progress Notes (Signed)
[facility] PREP Weekly Session   Patient Details  Name: Rossie Scarfone MRN: [id number] Date of Birth: Feb 18, 1940 Age: 76 y.o. PCP: Fanny Bien, MD  There were no vitals filed for this visit.      [facility] Weekly seesion - 06/23/16 1300      Weekly Session   Topic Discussed Other ways to be active   Classes attended to date [place] 06/23/2016, 1:25 PM

## 2016-06-30 ENCOUNTER — Other Ambulatory Visit (HOSPITAL_COMMUNITY): Payer: Self-pay

## 2016-06-30 NOTE — Progress Notes (Signed)
Paramedicine Encounter    Patient ID: Tara Dunn, female    DOB: 02-04-1941, 76 y.o.   MRN: [id number]    Patient Care Team: Fanny Bien, MD as PCP - General (Family Medicine)  Patient Active Problem List   Diagnosis Date Noted  . Joint pain 12/09/2015  . ICD (implantable cardioverter-defibrillator) in place 03/26/2015  . Syncope 03/06/2015  . Obesity (BMI 30-39.9) 01/21/2015  . Excessive daytime sleepiness 08/04/2014  . OSA (obstructive sleep apnea) 08/04/2014  . Essential hypertension 06/13/2014  . Chronic systolic CHF (congestive heart failure) ([place]) 05/20/2014  . Congestive heart disease ([place])   . CKD (chronic kidney disease), stage III 05/03/2014  . Hx of stroke without residual deficits 05/03/2014    Current Outpatient Prescriptions:  .  allopurinol (ZYLOPRIM) 100 MG tablet, Take 200 mg by mouth daily. , Disp: , Rfl: 0 .  aspirin EC 81 MG tablet, Take 1 tablet (81 mg total) by mouth daily., Disp: 90 tablet, Rfl: 3 .  atorvastatin (LIPITOR) 40 MG tablet, Take 1 tablet (40 mg total) by mouth daily., Disp: 90 tablet, Rfl: 3 .  carvedilol (COREG) 25 MG tablet, Take 1 tablet (25 mg total) by mouth 2 (two) times daily., Disp: 180 tablet, Rfl: 3 .  furosemide (LASIX) 20 MG tablet, Take 2 tablets (40 mg total) by mouth daily., Disp: 60 tablet, Rfl: 5 .  gabapentin (NEURONTIN) 300 MG capsule, Take 300 mg by mouth 3 (three) times daily., Disp: , Rfl:  .  isosorbide-hydrALAZINE (BIDIL) 20-37.5 MG tablet, Take 2 tablets by mouth 3 (three) times daily., Disp: 180 tablet, Rfl: 5 .  omeprazole (PRILOSEC) 40 MG capsule, Take 40 mg by mouth daily., Disp: , Rfl: 0 .  sacubitril-valsartan (ENTRESTO) 97-103 MG, Take 1 tablet by mouth 2 (two) times daily., Disp: 60 tablet, Rfl: 5 .  spironolactone (ALDACTONE) 25 MG tablet, Take 1 tablet (25 mg total) by mouth daily., Disp: 90 tablet, Rfl: 3 .  colchicine 0.6 MG tablet, Take 0.6 mg by mouth daily as needed (gouty flare)., Disp: , Rfl:   .  naproxen sodium (ANAPROX) 220 MG tablet, Take 220 mg by mouth daily as needed (back pain). Reported on 08/26/2015, Disp: , Rfl:  .  traMADol (ULTRAM) 50 MG tablet, Take 1 tablet (50 mg total) by mouth every 12 (twelve) hours as needed (twice daily as needed for pain). (Patient not taking: Reported on 06/09/2016), Disp: 60 tablet, Rfl: 0 Allergies  Allergen Reactions  . Shellfish Allergy Hives     Social History   Social History  . Marital status: Widowed    Spouse name: N/A  . Number of children: N/A  . Years of education: N/A   Occupational History  . Not on file.   Social History Main Topics  . Smoking status: Former Smoker    Packs/day: 0.50    Years: 40.00    Types: Cigarettes  . Smokeless tobacco: Never Used     Comment: Quit in 2009.  Marland Kitchen Alcohol use No     Comment: "drank occasionally when I was young; last drink was in the late 1990s"  . Drug use: No  . Sexual activity: No   Other Topics Concern  . Not on file   Social History Narrative   Lives alone in an independent living facility.  Education: 10th grade.     Retired from [employer] work.  Moved here from [place] in 2015.    Physical Exam  Eyes: Pupils are equal, round, and reactive to  light.  Pulmonary/Chest: No respiratory distress. She has no wheezes. She has no rales.  Abdominal: She exhibits no distension. There is no tenderness. There is no guarding.  Musculoskeletal: She exhibits no edema.  Skin: Skin is warm and dry. She is not diaphoretic.        SAFE - 06/10/16 1400      Situation   Heart failure history Exisiting   Comorbidities CVA;HTN;Hx MI/CAD   Target Weight 190 lbs     Assessment   Lives alone Yes   Primary support person faith gillespie daughter   Mode of transportation family/friends   Other services involved None   Home equipement Cane;Scale;Walker     Weight   Weighs self daily No   Scale provided Yes   Records on weight chart No     Resources   Has "Living better w/heart  failure" book No   Has HF Zone tool No   Able to identify yellow zone signs/when to call MD No   Records zone daily No     Medications   Uses a pill box Yes   Who stocks the pill box chp   Pill box checked this visit Yes   Pill box refilled this visit Yes   Difficulty obtaining medications No   Mail order medications No   Missed one or more doses of medications per week No     Nutrition   Patient receives meals on wheels No   Patient follows low sodium diet No   Has foods at home that meet the current recommended diet Yes   Patient follows low sugar/card diet No   Nutritional concerns/issues yes     Activity Level   ADL's/Mobility Independent   How many feet can patient ambulate 30   Barriers defecits due to previous stroke     Urine   Difficulty urinating No   Changes in urine None     Time spent with patient   Time spent with patient  60 Minutes        Future Appointments Date Time Provider [place]  07/08/2016 11:00 AM MC ECHO 1-BUZZ MC-ECHOL[facility]  07/08/2016 12:00 PM MC-HVSC CLINIC MC-HVSC None  08/18/2016 8:50 AM [facility] DEVICE REMOTES [facility]    ATF [name] x4 sitting down c/o of being very tired. Pt just got home from swimming and shopping. She's still swimming 3 times a week. Her daughter took her out for lunch after shopping. she ate fried chicken with mashed potatoes and gravy, we discussed her diet during this visit.  She is still weighing daily although she didn't record her reading on the calender.  Pt didn't mess any medication dosages.  rx bottles verified and pill box refilled.    BP 120/74 (BP Location: Right Arm, Patient Position: Sitting, Cuff Size: Large)   Pulse 70   Resp 16   Wt 221 lb 12.8 oz (100.6 kg)   SpO2 98%   BMI 39.29 kg/m    cbg 131 Weight yesterday-221 Last visit weight-221.6  Loghan Subia, EMT Paramedic 06/30/2016  ACTION: Home visit completed

## 2016-07-06 ENCOUNTER — Other Ambulatory Visit: Payer: Self-pay | Admitting: Internal Medicine

## 2016-07-07 DIAGNOSIS — M549 Dorsalgia, unspecified: Secondary | ICD-10-CM | POA: Diagnosis not present

## 2016-07-07 DIAGNOSIS — R296 Repeated falls: Secondary | ICD-10-CM | POA: Diagnosis not present

## 2016-07-07 DIAGNOSIS — G8194 Hemiplegia, unspecified affecting left nondominant side: Secondary | ICD-10-CM | POA: Diagnosis not present

## 2016-07-07 DIAGNOSIS — M6281 Muscle weakness (generalized): Secondary | ICD-10-CM | POA: Diagnosis not present

## 2016-07-07 NOTE — Progress Notes (Signed)
[facility] PREP Weekly Session   Patient Details  Name: Tara Dunn MRN: [id number] Date of Birth: November 30, 1940 Age: 76 y.o. PCP: Fanny Bien, MD  Vitals:   07/07/16 1137  Weight: 222 lb 9.6 oz (101 kg)        [facility] Weekly seesion - 07/07/16 1100      Weekly Session   Topic Discussed Health habits   Classes attended to date 2       Tara Dunn 07/07/2016, 11:38 AM

## 2016-07-08 ENCOUNTER — Ambulatory Visit (HOSPITAL_COMMUNITY)
Admission: RE | Admit: 2016-07-08 | Discharge: 2016-07-08 | Disposition: A | Discharge status: Home / Self Care | Payer: PPO | Source: Ambulatory Visit | Attending: Cardiology | Admitting: Cardiology

## 2016-07-08 ENCOUNTER — Other Ambulatory Visit (HOSPITAL_COMMUNITY): Payer: Self-pay

## 2016-07-08 ENCOUNTER — Encounter (HOSPITAL_COMMUNITY): Payer: Self-pay

## 2016-07-08 ENCOUNTER — Ambulatory Visit (HOSPITAL_BASED_OUTPATIENT_CLINIC_OR_DEPARTMENT_OTHER)
Admission: RE | Admit: 2016-07-08 | Discharge: 2016-07-08 | Disposition: A | Discharge status: Home / Self Care | Payer: PPO | Source: Ambulatory Visit

## 2016-07-08 VITALS — BP 120/76 | HR 72 | Wt 222.8 lb

## 2016-07-08 DIAGNOSIS — Z853 Personal history of malignant neoplasm of breast: Secondary | ICD-10-CM | POA: Diagnosis not present

## 2016-07-08 DIAGNOSIS — Z8673 Personal history of transient ischemic attack (TIA), and cerebral infarction without residual deficits: Secondary | ICD-10-CM | POA: Diagnosis not present

## 2016-07-08 DIAGNOSIS — E669 Obesity, unspecified: Secondary | ICD-10-CM | POA: Diagnosis not present

## 2016-07-08 DIAGNOSIS — I429 Cardiomyopathy, unspecified: Secondary | ICD-10-CM | POA: Diagnosis not present

## 2016-07-08 DIAGNOSIS — I130 Hypertensive heart and chronic kidney disease with heart failure and stage 1 through stage 4 chronic kidney disease, or unspecified chronic kidney disease: Secondary | ICD-10-CM | POA: Insufficient documentation

## 2016-07-08 DIAGNOSIS — Z87891 Personal history of nicotine dependence: Secondary | ICD-10-CM | POA: Diagnosis not present

## 2016-07-08 DIAGNOSIS — I10 Essential (primary) hypertension: Secondary | ICD-10-CM | POA: Diagnosis not present

## 2016-07-08 DIAGNOSIS — I5022 Chronic systolic (congestive) heart failure: Secondary | ICD-10-CM

## 2016-07-08 DIAGNOSIS — Z79899 Other long term (current) drug therapy: Secondary | ICD-10-CM | POA: Insufficient documentation

## 2016-07-08 DIAGNOSIS — M109 Gout, unspecified: Secondary | ICD-10-CM | POA: Diagnosis not present

## 2016-07-08 DIAGNOSIS — Z95810 Presence of automatic (implantable) cardiac defibrillator: Secondary | ICD-10-CM | POA: Insufficient documentation

## 2016-07-08 DIAGNOSIS — N183 Chronic kidney disease, stage 3 unspecified: Secondary | ICD-10-CM

## 2016-07-08 DIAGNOSIS — G4733 Obstructive sleep apnea (adult) (pediatric): Secondary | ICD-10-CM | POA: Insufficient documentation

## 2016-07-08 DIAGNOSIS — Z9012 Acquired absence of left breast and nipple: Secondary | ICD-10-CM | POA: Insufficient documentation

## 2016-07-08 DIAGNOSIS — I2510 Atherosclerotic heart disease of native coronary artery without angina pectoris: Secondary | ICD-10-CM | POA: Insufficient documentation

## 2016-07-08 DIAGNOSIS — Z7982 Long term (current) use of aspirin: Secondary | ICD-10-CM | POA: Insufficient documentation

## 2016-07-08 LAB — BASIC METABOLIC PANEL
ANION GAP: 9 (ref 5–15)
BUN: 30 mg/dL — ABNORMAL HIGH (ref 6–20)
CHLORIDE: 106 mmol/L (ref 101–111)
CO2: 26 mmol/L (ref 22–32)
Calcium: 9.9 mg/dL (ref 8.9–10.3)
Creatinine, Ser: 1.81 mg/dL — ABNORMAL HIGH (ref 0.44–1.00)
GFR calc Af Amer: 30 mL/min — ABNORMAL LOW (ref >60)
GFR calc non Af Amer: 26 mL/min — ABNORMAL LOW (ref >60)
GLUCOSE: 99 mg/dL (ref 65–99)
Potassium: 4.2 mmol/L (ref 3.5–5.1)
Sodium: 141 mmol/L (ref 135–145)

## 2016-07-08 NOTE — Patient Instructions (Signed)
Labs today  Your physician recommends that you schedule a follow-up appointment in: 3 months  

## 2016-07-08 NOTE — Progress Notes (Signed)
Paramedicine Encounter   Patient ID: Tara Dunn , female,   DOB: 20-Jul-1940,75 y.o.,  MRN: [id number]   Met patient in clinic today with provider Mclean.  Dr. Aundra Dubin reports that her EF has increased to 40% and she's lost 5lbs.  She still has sob while walking about 100 ft. She can lay flat in bed to sleep at night.  She denies dizziness. During today's visit they will check kidney function and potassium levels.    "lungs are good". no medication changes today. Pt stated that the physical therapist told her to pause [facility] for a while. Pt stated that she has a physical therapist that will start to work with her at the [employer] 6/13 to exercise her back and legs. Pt took her medications and she brought them to this visit but she left her pill box at home.  rx bottles verified during this visit.  Time spent with patient 30 mins  Kaelah Hayashi, EMT-Paramedic 07/08/2016   ACTION: Home visit completed

## 2016-07-09 ENCOUNTER — Other Ambulatory Visit (HOSPITAL_COMMUNITY): Payer: Self-pay

## 2016-07-09 NOTE — Progress Notes (Signed)
Paramedicine Encounter    Patient ID: Tara Dunn, female    DOB: 05/20/1940, 76 y.o.   MRN: [id number]    Patient Care Team: Fanny Bien, MD as PCP - General (Family Medicine)  Patient Active Problem List   Diagnosis Date Noted  . Joint pain 12/09/2015  . ICD (implantable cardioverter-defibrillator) in place 03/26/2015  . Syncope 03/06/2015  . Obesity (BMI 30-39.9) 01/21/2015  . Excessive daytime sleepiness 08/04/2014  . OSA (obstructive sleep apnea) 08/04/2014  . Essential hypertension 06/13/2014  . Chronic systolic CHF (congestive heart failure) ([place]) 05/20/2014  . Congestive heart disease ([place])   . CKD (chronic kidney disease), stage III 05/03/2014  . Hx of stroke without residual deficits 05/03/2014    Current Outpatient Prescriptions:  .  allopurinol (ZYLOPRIM) 100 MG tablet, Take 200 mg by mouth daily. , Disp: , Rfl: 0 .  aspirin EC 81 MG tablet, Take 1 tablet (81 mg total) by mouth daily., Disp: 90 tablet, Rfl: 3 .  atorvastatin (LIPITOR) 40 MG tablet, Take 1 tablet (40 mg total) by mouth daily., Disp: 90 tablet, Rfl: 3 .  carvedilol (COREG) 25 MG tablet, Take 1 tablet (25 mg total) by mouth 2 (two) times daily., Disp: 180 tablet, Rfl: 3 .  colchicine 0.6 MG tablet, Take 0.6 mg by mouth daily as needed (gouty flare)., Disp: , Rfl:  .  furosemide (LASIX) 20 MG tablet, Take 2 tablets (40 mg total) by mouth daily., Disp: 60 tablet, Rfl: 5 .  gabapentin (NEURONTIN) 300 MG capsule, Take 300 mg by mouth 3 (three) times daily., Disp: , Rfl:  .  isosorbide-hydrALAZINE (BIDIL) 20-37.5 MG tablet, Take 2 tablets by mouth 3 (three) times daily., Disp: 180 tablet, Rfl: 5 .  omeprazole (PRILOSEC) 40 MG capsule, Take 40 mg by mouth daily., Disp: , Rfl: 0 .  sacubitril-valsartan (ENTRESTO) 97-103 MG, Take 1 tablet by mouth 2 (two) times daily., Disp: 60 tablet, Rfl: 5 .  spironolactone (ALDACTONE) 25 MG tablet, Take 1 tablet (25 mg total) by mouth daily., Disp: 90 tablet, Rfl:  3 .  naproxen sodium (ANAPROX) 220 MG tablet, Take 220 mg by mouth daily as needed (back pain). Reported on 08/26/2015, Disp: , Rfl:  .  traMADol (ULTRAM) 50 MG tablet, Take 1 tablet (50 mg total) by mouth every 12 (twelve) hours as needed (twice daily as needed for pain)., Disp: 60 tablet, Rfl: 0 Allergies  Allergen Reactions  . Shellfish Allergy Hives     Social History   Social History  . Marital status: Widowed    Spouse name: N/A  . Number of children: N/A  . Years of education: N/A   Occupational History  . Not on file.   Social History Main Topics  . Smoking status: Former Smoker    Packs/day: 0.50    Years: 40.00    Types: Cigarettes  . Smokeless tobacco: Never Used     Comment: Quit in 2009.  Marland Kitchen Alcohol use No     Comment: "drank occasionally when I was young; last drink was in the late 1990s"  . Drug use: No  . Sexual activity: No   Other Topics Concern  . Not on file   Social History Narrative   Lives alone in an independent living facility.  Education: 10th grade.     Retired from [employer] work.  Moved here from [place] in 2015.    Physical Exam  Pulmonary/Chest: No respiratory distress. She has no wheezes. She has no rales.  Abdominal:  She exhibits no distension. There is no tenderness. There is no guarding.  Musculoskeletal: She exhibits no edema.  Skin: Skin is warm and dry. She is not diaphoretic.        SAFE - 06/10/16 1400      Situation   Heart failure history Exisiting   Comorbidities CVA;HTN;Hx MI/CAD   Target Weight 190 lbs     Assessment   Lives alone Yes   Primary support person faith gillespie daughter   Mode of transportation family/friends   Other services involved None   Home equipement Cane;Scale;Walker     Weight   Weighs self daily No   Scale provided Yes   Records on weight chart No     Resources   Has "Living better w/heart failure" book No   Has HF Zone tool No   Able to identify yellow zone signs/when to call MD No    Records zone daily No     Medications   Uses a pill box Yes   Who stocks the pill box chp   Pill box checked this visit Yes   Pill box refilled this visit Yes   Difficulty obtaining medications No   Mail order medications No   Missed one or more doses of medications per week No     Nutrition   Patient receives meals on wheels No   Patient follows low sodium diet No   Has foods at home that meet the current recommended diet Yes   Patient follows low sugar/card diet No   Nutritional concerns/issues yes     Activity Level   ADL's/Mobility Independent   How many feet can patient ambulate 30   Barriers defecits due to previous stroke     Urine   Difficulty urinating No   Changes in urine None     Time spent with patient   Time spent with patient  60 Minutes        Future Appointments Date Time Provider [place]  08/18/2016 8:50 AM [facility] DEVICE REMOTES [facility]  10/08/2016 10:00 AM [facility] PA/NP [facility] None    ATF [name] x4 c/o lower back pain and knee pain. Pt has chronic back pain and is currently working with a physical therapist.  she had sleep issues last night due to worrying about her son.  This morning sthe stated that she feels a little better, she just worries about him a lot because he's an alcoholic and homeless.  Pts daughter will be over later today and they will go shopping.  She hasn't taken her morning medications because she hasn't eaten yet.  She plan on eating egg and toast for breakfast.    She is weighing daily and recording her weights.  Shes taken all of her medications and hasn't missed a dose this week. She denies sob, headache, dizziness and chest pain. rx bottles verified and pill box refilled.   I advised her that Joellen Jersey will see her next week.  *rx need to pickup: Colchicine no pills in mon-thurs pts daughter asked to put in box ASA  BP 130/86 (BP Location: Right Arm, Patient Position: Sitting, Cuff Size: Large)    Pulse 72   Resp 16   Wt 219 lb 12.8 oz (99.7 kg)   SpO2 97%   BMI 38.94 kg/m  cbg Worthington Hulet Ehrmann, EMT Paramedic 07/09/2016    ACTION: Home visit completed

## 2016-07-10 NOTE — Progress Notes (Signed)
Patient ID: Tara Dunn, female   DOB: Apr 29, 1940, 76 y.o.   MRN: [id number]     Advanced Heart Failure Clinic Note   PCP: Dr. Ernie Hew HF: Dr. Aundra Dubin   76 yo with long history of HTN, CVAs, CKD, and systolic CHF.  She moved to [place] from [place] in 2015.   In 3/16, she was admitted a hypertensive emergency and acute CHF.  CTA chest showed no PE.  Echo showed EF 25-30% with moderate LVH.  She was diuresed and had a cardiac catheterization given extensive coronary calcification seen on CTA chest.  Cath showed diffuse moderate CAD, worst was 50-70% mRCA stenosis.   Cardiac MRI in 12/16 showed EF 32%.  ICD was placed in 2/17.    She returns for followup today.  Now followed by paramedicine and taking her meds regularly.  Echo was done today and reviewed, EF up to 40% with moderate LVH.  She is walking with a walker.  She is short of breath after about 200 feet, this is improved.  No orthopnea.  No chest pain.  No lightheadedness.   Optivol: Fluid index < threshold, impedance stable, no AF or VT.    Labs (3/16): K 4.2, creatinine 1.44, HCT 39.7 Labs (06/05/14): K 4.1, creatinine 2.01 Labs (5/16): K 4.7, creatinine 1.56 Labs (7/16): K 4.7, creatinine 1.68, BNP 14, LDL 62 Labs (11/16): K 4.2, creatinine 1.93 Labs (1/17): K 4.6, creatinine 1.65, HCT 39.2 Labs (4/17): K 4.5, creatinine 1.98 Labs (6/17): K 4, creatinine 2 Labs (8/17): K 4.4, creatinine 1.64   Labs (10/17): K 3.9, creatinine 1.5, BNP 15 Labs (1/18): K 4.3, creatinine 1.59 Labs (4/18): LDL 46, HDL 57 Labs (5/18): K 4.6, creatinine 1.55  PMH: 1. CVA: 3 prior CVAs, last in 1998.  Thought to be related to HTN. 2. HTN: Long-standing, says she has been hypertensive since age 37. Renal artery dopplers (4/16) were normal.  3. CKD: Suspect related to HTN. 4. Breast cancer: 2009, left mastectomy with chemotherapy.  5. Gout 6. Obese 7. TAH 8. CAD: LHC (3/16) with moderate diffuse disease, worst lesion being 50-70% mid RCA.    9. Cardiomyopathy: Echo (3/16) with EF 25-30%, moderate LVH.  Echo (6/16) with EF 35-40%, moderate LVH, diffuse hypokinesis with septal-lateral dyssynchrony.  Cardiac MRI (12/16) with EF 32%, diffuse hypokinesis, no contrast given due to CKD.  - Medtronic ICD 2/17.  - Echo (5/18): EF 40%, diffuse hypokinesis, moderate LVH, normal RV size and systolic function.  10. OSA: Using CPAP.  11. Event monitor (1/17) with NSR, occasional PVCs.   SH: From [place], moved to [place] in 2015 to be near daughter.  Single.  Quit smoking 2009.    FH: HTN runs in family.  Father with MI.   ROS: All systems reviewed and negative except as per HPI.   Current Outpatient Prescriptions  Medication Sig Dispense Refill  . allopurinol (ZYLOPRIM) 100 MG tablet Take 200 mg by mouth daily.   0  . aspirin EC 81 MG tablet Take 1 tablet (81 mg total) by mouth daily. 90 tablet 3  . atorvastatin (LIPITOR) 40 MG tablet Take 1 tablet (40 mg total) by mouth daily. 90 tablet 3  . carvedilol (COREG) 25 MG tablet Take 1 tablet (25 mg total) by mouth 2 (two) times daily. 180 tablet 3  . furosemide (LASIX) 20 MG tablet Take 2 tablets (40 mg total) by mouth daily. 60 tablet 5  . gabapentin (NEURONTIN) 300 MG capsule Take 300 mg by mouth  3 (three) times daily.    . isosorbide-hydrALAZINE (BIDIL) 20-37.5 MG tablet Take 2 tablets by mouth 3 (three) times daily. 180 tablet 5  . naproxen sodium (ANAPROX) 220 MG tablet Take 220 mg by mouth daily as needed (back pain). Reported on 08/26/2015    . omeprazole (PRILOSEC) 40 MG capsule Take 40 mg by mouth daily.  0  . sacubitril-valsartan (ENTRESTO) 97-103 MG Take 1 tablet by mouth 2 (two) times daily. 60 tablet 5  . spironolactone (ALDACTONE) 25 MG tablet Take 1 tablet (25 mg total) by mouth daily. 90 tablet 3  . traMADol (ULTRAM) 50 MG tablet Take 1 tablet (50 mg total) by mouth every 12 (twelve) hours as needed (twice daily as needed for pain). 60 tablet 0  . colchicine 0.6 MG tablet Take 0.6 mg by  mouth daily as needed (gouty flare).     No current facility-administered medications for this encounter.    BP 120/76   Pulse 72   Wt 222 lb 12 oz (101 kg)   SpO2 98%   BMI 39.46 kg/m    Wt Readings from Last 3 Encounters:  07/09/16 219 lb 12.8 oz (99.7 kg)  07/08/16 222 lb 12 oz (101 kg)  07/07/16 222 lb 9.6 oz (101 kg)    General: NAD HEENT: Normal.  Neck: JVP 7 cm.  no thyromegaly or thyroid nodule.  Lungs: Clear, normal effort.  CV: Nondisplaced PMI.  Heart regular S1/S2, no S3/S4, no murmur. No edema. No carotid bruit.  Normal pedal pulses.  Abdomen: Soft, NT, ND, no HSM. No bruits or masses. +BS  Skin: Intact without lesions or rashes.  Neurologic: Alert and oriented x 3.  Psych: Normal affect. Extremities: No clubbing or cyanosis.   Assessment/Plan: 1. HTN: Has history of CVA and CKD.  Renal artery dopplers were normal.  BP now controlled.  2. CAD: Moderate diffuse CAD, no severe obstruction.  This did not cause her cardiomyopathy.  No chest pain.  - Continue ASA 81 - Continue atorvastatin 40 mg daily, good lipids in 4/18.    3. CKD III:  BMET today.  - Avoid NSAID use.   4. 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  EF has been persistently low, 32% by cardiac MRI in 12/16.  Unable to give contrast to assess for scar/infiltrative disease due to CKD.  She had Medtronic ICD placed in 2/17.  NYHA class IIIa symptoms, not volume overloaded by exam or Optivol.  Echo was done today, EF up to 40%.  - Continue Lasix 40 mg daily. BMET today.  - Continue hydralazine 75 mg tid.    - Continue imdur 90 mg daily - Continue spironolactone 25 mg daily.  - Continue Entresto 49/51 bid.  - Continue Coreg 25 mg bid.    5. OSA: Uses CPAP.   6. Polypharmacy: Doing better with meds now that in paramedicine program.   Followup in 3 months.   Loralie Champagne,  07/10/2016

## 2016-07-12 ENCOUNTER — Telehealth (HOSPITAL_COMMUNITY): Payer: Self-pay | Admitting: *Deleted

## 2016-07-12 DIAGNOSIS — I5022 Chronic systolic (congestive) heart failure: Secondary | ICD-10-CM

## 2016-07-12 DIAGNOSIS — M549 Dorsalgia, unspecified: Secondary | ICD-10-CM | POA: Diagnosis not present

## 2016-07-12 DIAGNOSIS — I69354 Hemiplegia and hemiparesis following cerebral infarction affecting left non-dominant side: Secondary | ICD-10-CM | POA: Diagnosis not present

## 2016-07-12 DIAGNOSIS — R296 Repeated falls: Secondary | ICD-10-CM | POA: Diagnosis not present

## 2016-07-12 DIAGNOSIS — I509 Heart failure, unspecified: Secondary | ICD-10-CM | POA: Diagnosis not present

## 2016-07-12 DIAGNOSIS — I110 Hypertensive heart disease with heart failure: Secondary | ICD-10-CM | POA: Diagnosis not present

## 2016-07-12 MED ORDER — FUROSEMIDE 20 MG PO TABS: 20.0000 mg | ORAL_TABLET | Freq: Every day | ORAL | 5 refills | Status: DC

## 2016-07-12 NOTE — Telephone Encounter (Signed)
-----   Message from Larey Dresser, MD sent at 07/09/2016 12:24 AM EDT ----- Decrease Lasix to 20 mg daily, repeat BMET in 1 week.

## 2016-07-15 ENCOUNTER — Other Ambulatory Visit (HOSPITAL_COMMUNITY): Payer: Self-pay

## 2016-07-15 NOTE — Progress Notes (Signed)
Paramedicine Encounter    Patient ID: Tara Dunn, female    DOB: 03-02-1940, 76 y.o.   MRN: [id number]   Patient Care Team: Fanny Bien, MD as PCP - General (Family Medicine)  Patient Active Problem List   Diagnosis Date Noted  . Joint pain 12/09/2015  . ICD (implantable cardioverter-defibrillator) in place 03/26/2015  . Syncope 03/06/2015  . Obesity (BMI 30-39.9) 01/21/2015  . Excessive daytime sleepiness 08/04/2014  . OSA (obstructive sleep apnea) 08/04/2014  . Essential hypertension 06/13/2014  . Chronic systolic CHF (congestive heart failure) ([place]) 05/20/2014  . Congestive heart disease ([place])   . CKD (chronic kidney disease), stage III 05/03/2014  . Hx of stroke without residual deficits 05/03/2014    Current Outpatient Prescriptions:  .  allopurinol (ZYLOPRIM) 100 MG tablet, Take 200 mg by mouth daily. , Disp: , Rfl: 0 .  aspirin EC 81 MG tablet, Take 1 tablet (81 mg total) by mouth daily., Disp: 90 tablet, Rfl: 3 .  atorvastatin (LIPITOR) 40 MG tablet, Take 1 tablet (40 mg total) by mouth daily., Disp: 90 tablet, Rfl: 3 .  carvedilol (COREG) 25 MG tablet, Take 1 tablet (25 mg total) by mouth 2 (two) times daily., Disp: 180 tablet, Rfl: 3 .  colchicine 0.6 MG tablet, Take 0.6 mg by mouth daily as needed (gouty flare)., Disp: , Rfl:  .  furosemide (LASIX) 20 MG tablet, Take 1 tablet (20 mg total) by mouth daily., Disp: 30 tablet, Rfl: 5 .  gabapentin (NEURONTIN) 300 MG capsule, Take 300 mg by mouth 3 (three) times daily., Disp: , Rfl:  .  isosorbide-hydrALAZINE (BIDIL) 20-37.5 MG tablet, Take 2 tablets by mouth 3 (three) times daily., Disp: 180 tablet, Rfl: 5 .  omeprazole (PRILOSEC) 40 MG capsule, Take 40 mg by mouth daily., Disp: , Rfl: 0 .  sacubitril-valsartan (ENTRESTO) 97-103 MG, Take 1 tablet by mouth 2 (two) times daily., Disp: 60 tablet, Rfl: 5 .  spironolactone (ALDACTONE) 25 MG tablet, Take 1 tablet (25 mg total) by mouth daily., Disp: 90 tablet, Rfl:  3 .  naproxen sodium (ANAPROX) 220 MG tablet, Take 220 mg by mouth daily as needed (back pain). Reported on 08/26/2015, Disp: , Rfl:  .  traMADol (ULTRAM) 50 MG tablet, Take 1 tablet (50 mg total) by mouth every 12 (twelve) hours as needed (twice daily as needed for pain). (Patient not taking: Reported on 07/15/2016), Disp: 60 tablet, Rfl: 0 Allergies  Allergen Reactions  . Shellfish Allergy Hives     Social History   Social History  . Marital status: Widowed    Spouse name: N/A  . Number of children: N/A  . Years of education: N/A   Occupational History  . Not on file.   Social History Main Topics  . Smoking status: Former Smoker    Packs/day: 0.50    Years: 40.00    Types: Cigarettes  . Smokeless tobacco: Never Used     Comment: Quit in 2009.  Marland Kitchen Alcohol use No     Comment: "drank occasionally when I was young; last drink was in the late 1990s"  . Drug use: No  . Sexual activity: No   Other Topics Concern  . Not on file   Social History Narrative   Lives alone in an independent living facility.  Education: 10th grade.     Retired from [employer] work.  Moved here from [place] in 2015.    Physical Exam      SAFE - 06/10/16 1400  Situation   Heart failure history Exisiting   Comorbidities CVA;HTN;Hx MI/CAD   Target Weight 190 lbs     Assessment   Lives alone Yes   Primary support person Tara Dunn daughter   Mode of transportation family/friends   Other services involved None   Home equipement Cane;Scale;Walker     Weight   Weighs self daily No   Scale provided Yes   Records on weight chart No     Resources   Has "Living better w/heart failure" book No   Has HF Zone tool No   Able to identify yellow zone signs/when to call MD No   Records zone daily No     Medications   Uses a pill box Yes   Who stocks the pill box chp   Pill box checked this visit Yes   Pill box refilled this visit Yes   Difficulty obtaining medications No   Mail order medications  No   Missed one or more doses of medications per week No     Nutrition   Patient receives meals on wheels No   Patient follows low sodium diet No   Has foods at home that meet the current recommended diet Yes   Patient follows low sugar/card diet No   Nutritional concerns/issues yes     Activity Level   ADL's/Mobility Independent   How many feet can patient ambulate 30   Barriers defecits due to previous stroke     Urine   Difficulty urinating No   Changes in urine None     Time spent with patient   Time spent with patient  60 Minutes        Future Appointments Date Time Provider [place]  07/20/2016 2:00 PM [facility] LAB [facility] None  08/18/2016 8:50 AM [facility] DEVICE REMOTES [facility]  10/08/2016 10:00 AM [facility] PA/NP [facility] None   BP 124/70   Pulse 66   Resp 16   Wt 222 lb (100.7 kg)   SpO2 96%   BMI 39.33 kg/m  Weight yesterday-222 Last visit weight-219  Pt reports she is doing ok, she got the phone call from clinic office about decreasing her lasix but she hasnt done that yet-will start that today though. She reports no sob, she sometimes gets sob when doing housework, she states there is PT coming out 2x a week but next Tuesday will be the last day. Pt states she seldom gets dizzy, same with h/a. No missed doses of her meds. meds verifed and pill box refilled.  The entresto is $70 at the pharmacy-she should be with the [facility] per the last note for Tara Dunn--left message for Tara Dunn about that. She goes back in for labs next Tuesday.  She needed more colchicine so we called the pharmacy and they advised will have to fax over her PCP for refills. She has some swelling to her legs, she reports there is always some swelling going on.  Her appetite is good, sleeps well at night, no coughing.   ACTION: Home visit completed Next visit planned for next week   Marylouise Stacks, EMT-Paramedic 07/15/16

## 2016-07-19 ENCOUNTER — Telehealth (HOSPITAL_COMMUNITY): Payer: Self-pay | Admitting: Pharmacist

## 2016-07-19 NOTE — Telephone Encounter (Signed)
Entresto 97-103 mg BID PA approved through 02/07/17.   [name]. [name], PharmD, BCPS, CPP Clinical Pharmacist Pager: [phone] Phone: [phone] 07/19/2016 8:49 AM

## 2016-07-20 ENCOUNTER — Ambulatory Visit (HOSPITAL_COMMUNITY)
Admission: RE | Admit: 2016-07-20 | Discharge: 2016-07-20 | Disposition: A | Discharge status: Home / Self Care | Payer: PPO | Source: Ambulatory Visit | Attending: Internal Medicine | Admitting: Internal Medicine

## 2016-07-20 DIAGNOSIS — I5022 Chronic systolic (congestive) heart failure: Secondary | ICD-10-CM | POA: Insufficient documentation

## 2016-07-20 LAB — BASIC METABOLIC PANEL
Anion gap: 8 (ref 5–15)
BUN: 35 mg/dL — AB (ref 6–20)
CHLORIDE: 108 mmol/L (ref 101–111)
CO2: 23 mmol/L (ref 22–32)
Calcium: 10.1 mg/dL (ref 8.9–10.3)
Creatinine, Ser: 1.69 mg/dL — ABNORMAL HIGH (ref 0.44–1.00)
GFR calc Af Amer: 33 mL/min — ABNORMAL LOW (ref >60)
GFR calc non Af Amer: 28 mL/min — ABNORMAL LOW (ref >60)
GLUCOSE: 96 mg/dL (ref 65–99)
POTASSIUM: 4.1 mmol/L (ref 3.5–5.1)
Sodium: 139 mmol/L (ref 135–145)

## 2016-07-21 ENCOUNTER — Other Ambulatory Visit: Payer: Self-pay

## 2016-07-21 NOTE — Progress Notes (Signed)
[facility] PREP Weekly Session   Patient Details  Name: Tara Dunn MRN: [id number] Date of Birth: 03/11/1940 Age: 76 y.o. PCP: Fanny Bien, MD  Vitals:   07/21/16 1344  Weight: 223 lb (101.2 kg)        [facility] Weekly seesion - 07/21/16 1300      Weekly Session   Topic Discussed Other  Guest speaker   Minutes exercised this week --  not reported   Classes attended to date [place] 07/21/2016, 1:45 PM

## 2016-07-22 ENCOUNTER — Other Ambulatory Visit (HOSPITAL_COMMUNITY): Payer: Self-pay

## 2016-07-22 NOTE — Progress Notes (Signed)
Paramedicine Encounter    Patient ID: Tara Dunn, female    DOB: 09-24-1940, 76 y.o.   MRN: [id number]    Patient Care Team: Fanny Bien, MD as PCP - General (Family Medicine)  Patient Active Problem List   Diagnosis Date Noted  . Joint pain 12/09/2015  . ICD (implantable cardioverter-defibrillator) in place 03/26/2015  . Syncope 03/06/2015  . Obesity (BMI 30-39.9) 01/21/2015  . Excessive daytime sleepiness 08/04/2014  . OSA (obstructive sleep apnea) 08/04/2014  . Essential hypertension 06/13/2014  . Chronic systolic CHF (congestive heart failure) ([place]) 05/20/2014  . Congestive heart disease ([place])   . CKD (chronic kidney disease), stage III 05/03/2014  . Hx of stroke without residual deficits 05/03/2014    Current Outpatient Prescriptions:  .  allopurinol (ZYLOPRIM) 100 MG tablet, Take 200 mg by mouth daily. , Disp: , Rfl: 0 .  aspirin EC 81 MG tablet, Take 1 tablet (81 mg total) by mouth daily., Disp: 90 tablet, Rfl: 3 .  atorvastatin (LIPITOR) 40 MG tablet, Take 1 tablet (40 mg total) by mouth daily., Disp: 90 tablet, Rfl: 3 .  carvedilol (COREG) 25 MG tablet, Take 1 tablet (25 mg total) by mouth 2 (two) times daily., Disp: 180 tablet, Rfl: 3 .  furosemide (LASIX) 20 MG tablet, Take 1 tablet (20 mg total) by mouth daily., Disp: 30 tablet, Rfl: 5 .  gabapentin (NEURONTIN) 300 MG capsule, Take 300 mg by mouth 3 (three) times daily., Disp: , Rfl:  .  isosorbide-hydrALAZINE (BIDIL) 20-37.5 MG tablet, Take 2 tablets by mouth 3 (three) times daily., Disp: 180 tablet, Rfl: 5 .  omeprazole (PRILOSEC) 40 MG capsule, Take 40 mg by mouth daily., Disp: , Rfl: 0 .  sacubitril-valsartan (ENTRESTO) 97-103 MG, Take 1 tablet by mouth 2 (two) times daily., Disp: 60 tablet, Rfl: 5 .  spironolactone (ALDACTONE) 25 MG tablet, Take 1 tablet (25 mg total) by mouth daily., Disp: 90 tablet, Rfl: 3 .  colchicine 0.6 MG tablet, Take 0.6 mg by mouth daily as needed (gouty flare)., Disp: , Rfl:   .  naproxen sodium (ANAPROX) 220 MG tablet, Take 220 mg by mouth daily as needed (back pain). Reported on 08/26/2015, Disp: , Rfl:  .  traMADol (ULTRAM) 50 MG tablet, Take 1 tablet (50 mg total) by mouth every 12 (twelve) hours as needed (twice daily as needed for pain). (Patient not taking: Reported on 07/15/2016), Disp: 60 tablet, Rfl: 0 Allergies  Allergen Reactions  . Shellfish Allergy Hives     Social History   Social History  . Marital status: Widowed    Spouse name: N/A  . Number of children: N/A  . Years of education: N/A   Occupational History  . Not on file.   Social History Main Topics  . Smoking status: Former Smoker    Packs/day: 0.50    Years: 40.00    Types: Cigarettes  . Smokeless tobacco: Never Used     Comment: Quit in 2009.  Marland Kitchen Alcohol use No     Comment: "drank occasionally when I was young; last drink was in the late 1990s"  . Drug use: No  . Sexual activity: No   Other Topics Concern  . Not on file   Social History Narrative   Lives alone in an independent living facility.  Education: 10th grade.     Retired from [employer] work.  Moved here from [place] in 2015.    Physical Exam  Eyes: Pupils are equal, round, and reactive to  light.  Pulmonary/Chest: No respiratory distress. She has no wheezes. She has no rales.  Abdominal: She exhibits no distension. There is no tenderness. There is no guarding.  Musculoskeletal: She exhibits no edema.  Skin: Skin is warm and dry. She is not diaphoretic.        SAFE - 06/10/16 1400      Situation   Heart failure history Exisiting   Comorbidities CVA;HTN;Hx MI/CAD   Target Weight 190 lbs     Assessment   Lives alone Yes   Primary support person faith gillespie daughter   Mode of transportation family/friends   Other services involved None   Home equipement Cane;Scale;Walker     Weight   Weighs self daily No   Scale provided Yes   Records on weight chart No     Resources   Has "Living better w/heart  failure" book No   Has HF Zone tool No   Able to identify yellow zone signs/when to call MD No   Records zone daily No     Medications   Uses a pill box Yes   Who stocks the pill box chp   Pill box checked this visit Yes   Pill box refilled this visit Yes   Difficulty obtaining medications No   Mail order medications No   Missed one or more doses of medications per week No     Nutrition   Patient receives meals on wheels No   Patient follows low sodium diet No   Has foods at home that meet the current recommended diet Yes   Patient follows low sugar/card diet No   Nutritional concerns/issues yes     Activity Level   ADL's/Mobility Independent   How many feet can patient ambulate 30   Barriers defecits due to previous stroke     Urine   Difficulty urinating No   Changes in urine None     Time spent with patient   Time spent with patient  60 Minutes        Future Appointments Date Time Provider [place]  08/18/2016 8:50 AM [facility] DEVICE REMOTES [facility]  10/08/2016 10:00 AM [facility] PA/NP [facility] None    ATF pt CAOx4 sitting in her living room w/no compalints.  She stated that she had to reschedule yesterdays appointment due to being tired from swimming.  Pt is still swimming for cardio. She denies sob, dizziness, headache and chest pain.  She missed tues and wed evening doses due to falling asleep prior to taking her meds.  She didn't want to set the alarm to remind her of the pm doses.  She stated that she "should be able to remember". She continues to follow the low sodium diet and watching how much fluid she drinks. She hasnt fallen in several weeks and she continues to be active.  rx bottles verified and pill box refilled.   *rx called in: Colchicine ([place])  BP 124/80 (BP Location: Right Arm, Patient Position: Sitting, Cuff Size: Large)   Pulse 73   Resp 16   Wt 223 lb 6.4 oz (101.3 kg)   SpO2 95%   BMI 39.57 kg/m   Weight  yesterday-223 Last visit weight-222    Samira Acero, EMT Paramedic 07/22/2016    ACTION: Home visit completed Next visit planned for next thursday

## 2016-07-26 ENCOUNTER — Telehealth: Payer: Self-pay

## 2016-07-26 NOTE — Telephone Encounter (Signed)
I recieved a fax from the [facility] stating that the pts Heart Failure fund is ending on 08/24/16. I left a message on the pts VM asking her to call me back.

## 2016-07-27 NOTE — Telephone Encounter (Signed)
The pt is advised and I gave her the phone number to the [facility] so she can re-apply over the phone. The pt verbalized understanding and states that her nurse will be at her home on Thursday this week and she will help her re-apply.

## 2016-07-29 ENCOUNTER — Other Ambulatory Visit (HOSPITAL_COMMUNITY): Payer: Self-pay

## 2016-07-29 NOTE — Progress Notes (Signed)
Paramedicine Encounter    Patient ID: Tara Dunn, female    DOB: 1940/04/19, 76 y.o.   MRN: [id number]    Patient Care Team: Fanny Bien, MD as PCP - General (Family Medicine)  Patient Active Problem List   Diagnosis Date Noted  . Joint pain 12/09/2015  . ICD (implantable cardioverter-defibrillator) in place 03/26/2015  . Syncope 03/06/2015  . Obesity (BMI 30-39.9) 01/21/2015  . Excessive daytime sleepiness 08/04/2014  . OSA (obstructive sleep apnea) 08/04/2014  . Essential hypertension 06/13/2014  . Chronic systolic CHF (congestive heart failure) ([place]) 05/20/2014  . Congestive heart disease ([place])   . CKD (chronic kidney disease), stage III 05/03/2014  . Hx of stroke without residual deficits 05/03/2014    Current Outpatient Prescriptions:  .  allopurinol (ZYLOPRIM) 100 MG tablet, Take 200 mg by mouth daily. , Disp: , Rfl: 0 .  aspirin EC 81 MG tablet, Take 1 tablet (81 mg total) by mouth daily., Disp: 90 tablet, Rfl: 3 .  atorvastatin (LIPITOR) 40 MG tablet, Take 1 tablet (40 mg total) by mouth daily., Disp: 90 tablet, Rfl: 3 .  carvedilol (COREG) 25 MG tablet, Take 1 tablet (25 mg total) by mouth 2 (two) times daily., Disp: 180 tablet, Rfl: 3 .  furosemide (LASIX) 20 MG tablet, Take 1 tablet (20 mg total) by mouth daily., Disp: 30 tablet, Rfl: 5 .  gabapentin (NEURONTIN) 300 MG capsule, Take 300 mg by mouth 3 (three) times daily., Disp: , Rfl:  .  isosorbide-hydrALAZINE (BIDIL) 20-37.5 MG tablet, Take 2 tablets by mouth 3 (three) times daily., Disp: 180 tablet, Rfl: 5 .  omeprazole (PRILOSEC) 40 MG capsule, Take 40 mg by mouth daily., Disp: , Rfl: 0 .  sacubitril-valsartan (ENTRESTO) 97-103 MG, Take 1 tablet by mouth 2 (two) times daily., Disp: 60 tablet, Rfl: 5 .  spironolactone (ALDACTONE) 25 MG tablet, Take 1 tablet (25 mg total) by mouth daily., Disp: 90 tablet, Rfl: 3 .  colchicine 0.6 MG tablet, Take 0.6 mg by mouth daily as needed (gouty flare)., Disp: , Rfl:   .  naproxen sodium (ANAPROX) 220 MG tablet, Take 220 mg by mouth daily as needed (back pain). Reported on 08/26/2015, Disp: , Rfl:  .  traMADol (ULTRAM) 50 MG tablet, Take 1 tablet (50 mg total) by mouth every 12 (twelve) hours as needed (twice daily as needed for pain). (Patient not taking: Reported on 07/15/2016), Disp: 60 tablet, Rfl: 0 Allergies  Allergen Reactions  . Shellfish Allergy Hives     Social History   Social History  . Marital status: Widowed    Spouse name: N/A  . Number of children: N/A  . Years of education: N/A   Occupational History  . Not on file.   Social History Main Topics  . Smoking status: Former Smoker    Packs/day: 0.50    Years: 40.00    Types: Cigarettes  . Smokeless tobacco: Never Used     Comment: Quit in 2009.  Marland Kitchen Alcohol use No     Comment: "drank occasionally when I was young; last drink was in the late 1990s"  . Drug use: No  . Sexual activity: No   Other Topics Concern  . Not on file   Social History Narrative   Lives alone in an independent living facility.  Education: 10th grade.     Retired from [employer] work.  Moved here from [place] in 2015.    Physical Exam  Pulmonary/Chest: No respiratory distress. She has no wheezes.  She has no rales.  Abdominal: She exhibits no distension. There is no tenderness. There is no guarding.  Skin: Skin is warm and dry. She is not diaphoretic.        SAFE - 06/10/16 1400      Situation   Heart failure history Exisiting   Comorbidities CVA;HTN;Hx MI/CAD   Target Weight 190 lbs     Assessment   Lives alone Yes   Primary support person faith gillespie daughter   Mode of transportation family/friends   Other services involved None   Home equipement Cane;Scale;Walker     Weight   Weighs self daily No   Scale provided Yes   Records on weight chart No     Resources   Has "Living better w/heart failure" book No   Has HF Zone tool No   Able to identify yellow zone signs/when to call MD No    Records zone daily No     Medications   Uses a pill box Yes   Who stocks the pill box chp   Pill box checked this visit Yes   Pill box refilled this visit Yes   Difficulty obtaining medications No   Mail order medications No   Missed one or more doses of medications per week No     Nutrition   Patient receives meals on wheels No   Patient follows low sodium diet No   Has foods at home that meet the current recommended diet Yes   Patient follows low sugar/card diet No   Nutritional concerns/issues yes     Activity Level   ADL's/Mobility Independent   How many feet can patient ambulate 30   Barriers defecits due to previous stroke     Urine   Difficulty urinating No   Changes in urine None     Time spent with patient   Time spent with patient  60 Minutes        Future Appointments Date Time Provider [place]  08/18/2016 8:50 AM [facility]ICE [facility]  10/08/2016 10:00 AM [facility] PA/NP [facility] None    ATF [name] x4 sitting in her living room watching tv. Pt had just gotten home from swimming [facility].  Pt stated that she feels good.  Pt hasn't had any dizziness, sob, headache or chest pain.  Pt stated that she hasn't fallen in a long while. Pt did eat spaghetti last night for dinner and today for lunch.  rx bottles verified and pill box reilled.     *rx called in: furosemide BP 126/82 (BP Location: Right Arm, Patient Position: Sitting, Cuff Size: Large)   Pulse (!) 58   Resp 12   Wt 228 lb (103.4 kg)   SpO2 97%   BMI 40.39 kg/m   Weight yesterday-224 Last visit weight-223    Tilmon Wisehart, EMT Paramedic 07/29/2016    ACTION: Home visit completed

## 2016-08-02 NOTE — Progress Notes (Signed)
[facility] PREP Weekly Session   Patient Details  Name: Tara Dunn MRN: [id number] Date of Birth: Jan 22, 1941 Age: 76 y.o. PCP: Fanny Bien, MD  Vitals:   08/02/16 0908  Weight: 223 lb (101.2 kg)        [facility] Weekly seesion - 08/02/16 0900      Weekly Session   Topic Discussed Importance of resistance training   Minutes exercised this week --  not reported   Classes attended to date 4     Things you are grateful for:"life"  Vanita Ingles 08/02/2016, 9:08 AM

## 2016-08-03 ENCOUNTER — Other Ambulatory Visit (HOSPITAL_COMMUNITY): Payer: Self-pay | Admitting: Pharmacist

## 2016-08-03 ENCOUNTER — Other Ambulatory Visit (HOSPITAL_COMMUNITY): Payer: Self-pay

## 2016-08-03 MED ORDER — COLCHICINE 0.6 MG PO TABS: ORAL_TABLET | ORAL | 0 refills | Status: DC

## 2016-08-03 NOTE — Progress Notes (Signed)
Paramedicine Encounter    Patient ID: Tara Dunn, female    DOB: July 13, 1940, 76 y.o.   MRN: [id number]    Patient Care Team: Fanny Bien, MD as PCP - General (Family Medicine)  Patient Active Problem List   Diagnosis Date Noted  . Joint pain 12/09/2015  . ICD (implantable cardioverter-defibrillator) in place 03/26/2015  . Syncope 03/06/2015  . Obesity (BMI 30-39.9) 01/21/2015  . Excessive daytime sleepiness 08/04/2014  . OSA (obstructive sleep apnea) 08/04/2014  . Essential hypertension 06/13/2014  . Chronic systolic CHF (congestive heart failure) ([place]) 05/20/2014  . Congestive heart disease ([place])   . CKD (chronic kidney disease), stage III 05/03/2014  . Hx of stroke without residual deficits 05/03/2014    Current Outpatient Prescriptions:  .  allopurinol (ZYLOPRIM) 100 MG tablet, Take 200 mg by mouth daily. , Disp: , Rfl: 0 .  aspirin EC 81 MG tablet, Take 1 tablet (81 mg total) by mouth daily., Disp: 90 tablet, Rfl: 3 .  atorvastatin (LIPITOR) 40 MG tablet, Take 1 tablet (40 mg total) by mouth daily., Disp: 90 tablet, Rfl: 3 .  carvedilol (COREG) 25 MG tablet, Take 1 tablet (25 mg total) by mouth 2 (two) times daily., Disp: 180 tablet, Rfl: 3 .  furosemide (LASIX) 20 MG tablet, Take 1 tablet (20 mg total) by mouth daily., Disp: 30 tablet, Rfl: 5 .  gabapentin (NEURONTIN) 300 MG capsule, Take 300 mg by mouth 3 (three) times daily., Disp: , Rfl:  .  isosorbide-hydrALAZINE (BIDIL) 20-37.5 MG tablet, Take 2 tablets by mouth 3 (three) times daily., Disp: 180 tablet, Rfl: 5 .  omeprazole (PRILOSEC) 40 MG capsule, Take 40 mg by mouth daily., Disp: , Rfl: 0 .  sacubitril-valsartan (ENTRESTO) 97-103 MG, Take 1 tablet by mouth 2 (two) times daily., Disp: 60 tablet, Rfl: 5 .  spironolactone (ALDACTONE) 25 MG tablet, Take 1 tablet (25 mg total) by mouth daily., Disp: 90 tablet, Rfl: 3 .  colchicine 0.6 MG tablet, Take 2 tablets (1.2 mg) at first sign of gout flare followed by 1  tablet (0.6 mg) 1 hour later, Disp: 6 tablet, Rfl: 0 .  naproxen sodium (ANAPROX) 220 MG tablet, Take 220 mg by mouth daily as needed (back pain). Reported on 08/26/2015, Disp: , Rfl:  .  traMADol (ULTRAM) 50 MG tablet, Take 1 tablet (50 mg total) by mouth every 12 (twelve) hours as needed (twice daily as needed for pain). (Patient not taking: Reported on 07/15/2016), Disp: 60 tablet, Rfl: 0 Allergies  Allergen Reactions  . Shellfish Allergy Hives     Social History   Social History  . Marital status: Widowed    Spouse name: N/A  . Number of children: N/A  . Years of education: N/A   Occupational History  . Not on file.   Social History Main Topics  . Smoking status: Former Smoker    Packs/day: 0.50    Years: 40.00    Types: Cigarettes  . Smokeless tobacco: Never Used     Comment: Quit in 2009.  Marland Kitchen Alcohol use No     Comment: "drank occasionally when I was young; last drink was in the late 1990s"  . Drug use: No  . Sexual activity: No   Other Topics Concern  . Not on file   Social History Narrative   Lives alone in an independent living facility.  Education: 10th grade.     Retired from [employer] work.  Moved here from [place] in 2015.  Physical Exam  Pulmonary/Chest: No respiratory distress. She has no wheezes. She has no rales.  Abdominal: She exhibits no distension. There is no tenderness. There is no guarding.  Skin: Skin is warm and dry. She is not diaphoretic.        SAFE - 06/10/16 1400      Situation   Heart failure history Exisiting   Comorbidities CVA;HTN;Hx MI/CAD   Target Weight 190 lbs     Assessment   Lives alone Yes   Primary support person faith gillespie daughter   Mode of transportation family/friends   Other services involved None   Home equipement Cane;Scale;Walker     Weight   Weighs self daily No   Scale provided Yes   Records on weight chart No     Resources   Has "Living better w/heart failure" book No   Has HF Zone tool No   Able to  identify yellow zone signs/when to call MD No   Records zone daily No     Medications   Uses a pill box Yes   Who stocks the pill box chp   Pill box checked this visit Yes   Pill box refilled this visit Yes   Difficulty obtaining medications No   Mail order medications No   Missed one or more doses of medications per week No     Nutrition   Patient receives meals on wheels No   Patient follows low sodium diet No   Has foods at home that meet the current recommended diet Yes   Patient follows low sugar/card diet No   Nutritional concerns/issues yes     Activity Level   ADL's/Mobility Independent   How many feet can patient ambulate 30   Barriers defecits due to previous stroke     Urine   Difficulty urinating No   Changes in urine None     Time spent with patient   Time spent with patient  60 Minutes        Future Appointments Date Time Provider [place]  08/18/2016 8:50 AM [facility] DEVICE REMOTES [facility]  10/08/2016 10:00 AM [facility] PA/NP [facility] None    ATF [name] x4 complaining of feeling really tired. She stated that she feels that way from time to time.  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  She denies pain, nausea or sob.  She stated that she has taken all of her medications and hasn't felt dizzy during this week.  Pt ate a peanutbutter and jelly sandwich for lunch today.  Pt's vitals noted; cbg 121. rx bottles verified and pill box refilled.  When pt stood up she stated that her knees were aching therefore she walked with her walker.   BP 112/68 (BP Location: Right Arm, Patient Position: Sitting, Cuff Size: Large)   Pulse 70   Resp 16   Wt 224 lb 6.4 oz (101.8 kg)   SpO2 98%   BMI 39.75 kg/m    Weight yesterday-didn't weigh Last visit weight-228    Agnes Probert, EMT Paramedic 08/03/2016    ACTION: Home visit completed

## 2016-08-04 NOTE — Progress Notes (Signed)
[facility] [employer] PREP Weekly Session   Patient Details  Name: Tara Dunn MRN: [id number] Date of Birth: Sep 25, 1940 Age: 76 y.o. PCP: Fanny Bien, MD  Vitals:   08/04/16 1306  Weight: 224 lb 6.4 oz (101.8 kg)        [facility] Weekly seesion - 08/04/16 1300      Weekly Session   Topic Discussed Expectations and non-scale victories   Minutes exercised this week --  not reported   Classes attended to date [place] 08/04/2016, 1:07 PM

## 2016-08-10 ENCOUNTER — Other Ambulatory Visit (HOSPITAL_COMMUNITY): Payer: Self-pay

## 2016-08-10 NOTE — Progress Notes (Signed)
Paramedicine Encounter    Patient ID: Tara Dunn, female    DOB: 01-31-1941, 76 y.o.   MRN: [id number]    Patient Care Team: Fanny Bien, MD as PCP - General (Family Medicine)  Patient Active Problem List   Diagnosis Date Noted  . Joint pain 12/09/2015  . ICD (implantable cardioverter-defibrillator) in place 03/26/2015  . Syncope 03/06/2015  . Obesity (BMI 30-39.9) 01/21/2015  . Excessive daytime sleepiness 08/04/2014  . OSA (obstructive sleep apnea) 08/04/2014  . Essential hypertension 06/13/2014  . Chronic systolic CHF (congestive heart failure) ([place]) 05/20/2014  . Congestive heart disease ([place])   . CKD (chronic kidney disease), stage III 05/03/2014  . Hx of stroke without residual deficits 05/03/2014    Current Outpatient Prescriptions:  .  allopurinol (ZYLOPRIM) 100 MG tablet, Take 200 mg by mouth daily. , Disp: , Rfl: 0 .  aspirin EC 81 MG tablet, Take 1 tablet (81 mg total) by mouth daily., Disp: 90 tablet, Rfl: 3 .  atorvastatin (LIPITOR) 40 MG tablet, Take 1 tablet (40 mg total) by mouth daily., Disp: 90 tablet, Rfl: 3 .  carvedilol (COREG) 25 MG tablet, Take 1 tablet (25 mg total) by mouth 2 (two) times daily., Disp: 180 tablet, Rfl: 3 .  furosemide (LASIX) 20 MG tablet, Take 1 tablet (20 mg total) by mouth daily., Disp: 30 tablet, Rfl: 5 .  gabapentin (NEURONTIN) 300 MG capsule, Take 300 mg by mouth 3 (three) times daily., Disp: , Rfl:  .  isosorbide-hydrALAZINE (BIDIL) 20-37.5 MG tablet, Take 2 tablets by mouth 3 (three) times daily., Disp: 180 tablet, Rfl: 5 .  omeprazole (PRILOSEC) 40 MG capsule, Take 40 mg by mouth daily., Disp: , Rfl: 0 .  sacubitril-valsartan (ENTRESTO) 97-103 MG, Take 1 tablet by mouth 2 (two) times daily., Disp: 60 tablet, Rfl: 5 .  spironolactone (ALDACTONE) 25 MG tablet, Take 1 tablet (25 mg total) by mouth daily., Disp: 90 tablet, Rfl: 3 .  colchicine 0.6 MG tablet, Take 2 tablets (1.2 mg) at first sign of gout flare followed by 1  tablet (0.6 mg) 1 hour later, Disp: 6 tablet, Rfl: 0 .  naproxen sodium (ANAPROX) 220 MG tablet, Take 220 mg by mouth daily as needed (back pain). Reported on 08/26/2015, Disp: , Rfl:  .  traMADol (ULTRAM) 50 MG tablet, Take 1 tablet (50 mg total) by mouth every 12 (twelve) hours as needed (twice daily as needed for pain). (Patient not taking: Reported on 07/15/2016), Disp: 60 tablet, Rfl: 0 Allergies  Allergen Reactions  . Shellfish Allergy Hives     Social History   Social History  . Marital status: Widowed    Spouse name: N/A  . Number of children: N/A  . Years of education: N/A   Occupational History  . Not on file.   Social History Main Topics  . Smoking status: Former Smoker    Packs/day: 0.50    Years: 40.00    Types: Cigarettes  . Smokeless tobacco: Never Used     Comment: Quit in 2009.  Marland Kitchen Alcohol use No     Comment: "drank occasionally when I was young; last drink was in the late 1990s"  . Drug use: No  . Sexual activity: No   Other Topics Concern  . Not on file   Social History Narrative   Lives alone in an independent living facility.  Education: 10th grade.     Retired from [employer] work.  Moved here from [place] in 2015.  Physical Exam  Pulmonary/Chest: No respiratory distress. She has no wheezes. She has no rales.  Abdominal: She exhibits no distension. There is no rebound.  Musculoskeletal: She exhibits no edema.  Skin: Skin is warm and dry. She is not diaphoretic.        Future Appointments Date Time Provider [place]  08/18/2016 8:50 AM [facility] DEVICE REMOTES [facility]  10/08/2016 10:00 AM [facility] PA/NP [facility] None    ATF [name] x4 c/o knee pain.  Pt stated that she has pain in both knees. Pt stated that she think that the pain is due to arthritis, she is currently out of her artheritis rx.  Pt denies sob, dizziness, headache and chest pain. Pt has taken all of her medications without difficulty.  pts primary physician's  office called for colchicine refill.  rx bottles verified and pill box refilled.   *rx called in: bidil  BP (!) 146/90 (BP Location: Right Arm, Patient Position: Sitting)   Pulse 63   Resp 12   Wt 223 lb 9.6 oz (101.4 kg)   SpO2 96%   BMI 39.61 kg/m   Weight yesterday-didn't weigh Last visit weight-224    Raghad Lorenz, EMT Paramedic 08/10/2016    ACTION: Home visit completed

## 2016-08-17 ENCOUNTER — Other Ambulatory Visit (HOSPITAL_COMMUNITY): Payer: Self-pay

## 2016-08-17 NOTE — Progress Notes (Signed)
Paramedicine Encounter    Patient ID: Tara Dunn, female    DOB: May 09, 1940, 76 y.o.   MRN: [id number]    Patient Care Team: Fanny Bien, MD as PCP - General (Family Medicine)  Patient Active Problem List   Diagnosis Date Noted  . Joint pain 12/09/2015  . ICD (implantable cardioverter-defibrillator) in place 03/26/2015  . Syncope 03/06/2015  . Obesity (BMI 30-39.9) 01/21/2015  . Excessive daytime sleepiness 08/04/2014  . OSA (obstructive sleep apnea) 08/04/2014  . Essential hypertension 06/13/2014  . Chronic systolic CHF (congestive heart failure) ([place]) 05/20/2014  . Congestive heart disease ([place])   . CKD (chronic kidney disease), stage III 05/03/2014  . Hx of stroke without residual deficits 05/03/2014    Current Outpatient Prescriptions:  .  allopurinol (ZYLOPRIM) 100 MG tablet, Take 200 mg by mouth daily. , Disp: , Rfl: 0 .  aspirin EC 81 MG tablet, Take 1 tablet (81 mg total) by mouth daily., Disp: 90 tablet, Rfl: 3 .  atorvastatin (LIPITOR) 40 MG tablet, Take 1 tablet (40 mg total) by mouth daily., Disp: 90 tablet, Rfl: 3 .  carvedilol (COREG) 25 MG tablet, Take 1 tablet (25 mg total) by mouth 2 (two) times daily., Disp: 180 tablet, Rfl: 3 .  colchicine 0.6 MG tablet, Take 2 tablets (1.2 mg) at first sign of gout flare followed by 1 tablet (0.6 mg) 1 hour later, Disp: 6 tablet, Rfl: 0 .  furosemide (LASIX) 20 MG tablet, Take 1 tablet (20 mg total) by mouth daily., Disp: 30 tablet, Rfl: 5 .  gabapentin (NEURONTIN) 300 MG capsule, Take 300 mg by mouth 3 (three) times daily., Disp: , Rfl:  .  isosorbide-hydrALAZINE (BIDIL) 20-37.5 MG tablet, Take 2 tablets by mouth 3 (three) times daily., Disp: 180 tablet, Rfl: 5 .  omeprazole (PRILOSEC) 40 MG capsule, Take 40 mg by mouth daily., Disp: , Rfl: 0 .  sacubitril-valsartan (ENTRESTO) 97-103 MG, Take 1 tablet by mouth 2 (two) times daily., Disp: 60 tablet, Rfl: 5 .  spironolactone (ALDACTONE) 25 MG tablet, Take 1 tablet  (25 mg total) by mouth daily., Disp: 90 tablet, Rfl: 3 .  naproxen sodium (ANAPROX) 220 MG tablet, Take 220 mg by mouth daily as needed (back pain). Reported on 08/26/2015, Disp: , Rfl:  .  traMADol (ULTRAM) 50 MG tablet, Take 1 tablet (50 mg total) by mouth every 12 (twelve) hours as needed (twice daily as needed for pain). (Patient not taking: Reported on 07/15/2016), Disp: 60 tablet, Rfl: 0 Allergies  Allergen Reactions  . Shellfish Allergy Hives     Social History   Social History  . Marital status: Widowed    Spouse name: N/A  . Number of children: N/A  . Years of education: N/A   Occupational History  . Not on file.   Social History Main Topics  . Smoking status: Former Smoker    Packs/day: 0.50    Years: 40.00    Types: Cigarettes  . Smokeless tobacco: Never Used     Comment: Quit in 2009.  Marland Kitchen Alcohol use No     Comment: "drank occasionally when I was young; last drink was in the late 1990s"  . Drug use: No  . Sexual activity: No   Other Topics Concern  . Not on file   Social History Narrative   Lives alone in an independent living facility.  Education: 10th grade.     Retired from [employer] work.  Moved here from [place] in 2015.  Physical Exam  Pulmonary/Chest: No respiratory distress. She has no wheezes. She has no rales.  Abdominal: She exhibits no distension. There is no tenderness. There is no guarding.  Musculoskeletal: She exhibits edema.  Edema in both feet and ankles  Skin: She is not diaphoretic.        Future Appointments Date Time Provider [place]  08/18/2016 8:50 AM [facility] DEVICE REMOTES [facility]  10/08/2016 10:00 AM [facility] PA/NP [facility] None    ATF [name] x4 c/o feeling bad and not having much energy.  Pt went swimming this am and got out of the pool early due to feeling dizzy.  Pt denies dizziness at this time; pt also denies sob, headache and chest pain.  She stated that she did not eat prior to taking her morning  medications and she think that's the cause of her "feeling bad". Pt has taken all of her medications this week and didn't miss a dose.  Pt's vitals noted/no ortho static changes when she stood up.  Pt has knee pain while standing and that's it.  I called [facility] clinic and advised them of the same via voicemail.  rx bottles verified and pill box refilled.   BP 90/62 (BP Location: Right Arm, Patient Position: Sitting, Cuff Size: Large)   Pulse 91   Wt 222 lb 6.4 oz (100.9 kg)   BMI 39.40 kg/m   Weight yesterday-222.4 Last visit weight-223  **rx called in: Tara Dunn gabapenin  Josiyah Tozzi, EMT Paramedic 08/17/2016    ACTION: Home visit completed Next visit planned for next tues

## 2016-08-18 ENCOUNTER — Ambulatory Visit (INDEPENDENT_AMBULATORY_CARE_PROVIDER_SITE_OTHER): Payer: PPO | Admitting: *Deleted

## 2016-08-18 DIAGNOSIS — Z95810 Presence of automatic (implantable) cardiac defibrillator: Secondary | ICD-10-CM

## 2016-08-18 DIAGNOSIS — I5022 Chronic systolic (congestive) heart failure: Secondary | ICD-10-CM

## 2016-08-18 NOTE — Progress Notes (Signed)
Remote ICD transmission.   

## 2016-08-23 NOTE — Progress Notes (Signed)
[facility] PREP Weekly Session   Patient Details  Name: Neriyah Cercone MRN: [id number] Date of Birth: 09-20-1940 Age: 76 y.o. PCP: Fanny Bien, MD  Vitals:   08/23/16 1146  Weight: 223 lb (101.2 kg)        [facility] Weekly seesion - 08/23/16 1100      Weekly Session   Topic Discussed Other  portion control   Classes attended to date [place] 08/23/2016, 11:46 AM

## 2016-08-24 ENCOUNTER — Encounter: Payer: Self-pay | Admitting: Cardiology

## 2016-08-25 ENCOUNTER — Other Ambulatory Visit (HOSPITAL_COMMUNITY): Payer: Self-pay

## 2016-08-25 ENCOUNTER — Telehealth (HOSPITAL_COMMUNITY): Payer: Self-pay | Admitting: Pharmacist

## 2016-08-25 NOTE — Progress Notes (Signed)
Paramedicine Encounter    Patient ID: Tara Dunn, female    DOB: May 09, 1940, 76 y.o.   MRN: [id number]    Patient Care Team: Fanny Bien, MD as PCP - General (Family Medicine)  Patient Active Problem List   Diagnosis Date Noted  . Joint pain 12/09/2015  . ICD (implantable cardioverter-defibrillator) in place 03/26/2015  . Syncope 03/06/2015  . Obesity (BMI 30-39.9) 01/21/2015  . Excessive daytime sleepiness 08/04/2014  . OSA (obstructive sleep apnea) 08/04/2014  . Essential hypertension 06/13/2014  . Chronic systolic CHF (congestive heart failure) ([place]) 05/20/2014  . Congestive heart disease ([place])   . CKD (chronic kidney disease), stage III 05/03/2014  . Hx of stroke without residual deficits 05/03/2014    Current Outpatient Prescriptions:  .  allopurinol (ZYLOPRIM) 100 MG tablet, Take 200 mg by mouth daily. , Disp: , Rfl: 0 .  aspirin EC 81 MG tablet, Take 1 tablet (81 mg total) by mouth daily., Disp: 90 tablet, Rfl: 3 .  atorvastatin (LIPITOR) 40 MG tablet, Take 1 tablet (40 mg total) by mouth daily., Disp: 90 tablet, Rfl: 3 .  carvedilol (COREG) 25 MG tablet, Take 1 tablet (25 mg total) by mouth 2 (two) times daily., Disp: 180 tablet, Rfl: 3 .  colchicine 0.6 MG tablet, Take 2 tablets (1.2 mg) at first sign of gout flare followed by 1 tablet (0.6 mg) 1 hour later, Disp: 6 tablet, Rfl: 0 .  furosemide (LASIX) 20 MG tablet, Take 1 tablet (20 mg total) by mouth daily., Disp: 30 tablet, Rfl: 5 .  gabapentin (NEURONTIN) 300 MG capsule, Take 300 mg by mouth 3 (three) times daily., Disp: , Rfl:  .  isosorbide-hydrALAZINE (BIDIL) 20-37.5 MG tablet, Take 2 tablets by mouth 3 (three) times daily., Disp: 180 tablet, Rfl: 5 .  omeprazole (PRILOSEC) 40 MG capsule, Take 40 mg by mouth daily., Disp: , Rfl: 0 .  sacubitril-valsartan (ENTRESTO) 97-103 MG, Take 1 tablet by mouth 2 (two) times daily., Disp: 60 tablet, Rfl: 5 .  spironolactone (ALDACTONE) 25 MG tablet, Take 1 tablet  (25 mg total) by mouth daily., Disp: 90 tablet, Rfl: 3 .  naproxen sodium (ANAPROX) 220 MG tablet, Take 220 mg by mouth daily as needed (back pain). Reported on 08/26/2015, Disp: , Rfl:  .  traMADol (ULTRAM) 50 MG tablet, Take 1 tablet (50 mg total) by mouth every 12 (twelve) hours as needed (twice daily as needed for pain). (Patient not taking: Reported on 07/15/2016), Disp: 60 tablet, Rfl: 0 Allergies  Allergen Reactions  . Shellfish Allergy Hives     Social History   Social History  . Marital status: Widowed    Spouse name: N/A  . Number of children: N/A  . Years of education: N/A   Occupational History  . Not on file.   Social History Main Topics  . Smoking status: Former Smoker    Packs/day: 0.50    Years: 40.00    Types: Cigarettes  . Smokeless tobacco: Never Used     Comment: Quit in 2009.  Marland Kitchen Alcohol use No     Comment: "drank occasionally when I was young; last drink was in the late 1990s"  . Drug use: No  . Sexual activity: No   Other Topics Concern  . Not on file   Social History Narrative   Lives alone in an independent living facility.  Education: 10th grade.     Retired from [employer] work.  Moved here from [place] in 2015.  Physical Exam  Pulmonary/Chest: No respiratory distress. She has no wheezes. She has no rales.  Abdominal: There is no tenderness. There is no guarding.  Skin: Skin is warm and dry. She is not diaphoretic.        Future Appointments Date Time Provider [place]  10/08/2016 10:00 AM [facility] PA/NP [facility] None  11/17/2016 9:15 AM [facility] DEVICE REMOTES [facility]    ATF [name] x4 standing in the hallway outside of her apartment. Pt stated that she feels a lot better than she did last week. She thinks that she needed some potassium in her diet therefore she made a smoothie last night.  Pt denies sob, dizziness, headache and chest pain.  Last week pt felt very and sluggish; her bp was at 90/62.  Pt didn't take her  afternoon medications for a week with included bidill and gabapentin. rx bottles verified and pill box refilled.    Arlyce Harman taken with pm slot this week to see if it makes a difference.    *rx called in:  entresto completely filled  BP 110/82 (BP Location: Right Arm, Cuff Size: Large)   Pulse 73   Resp 16   SpO2 96%   Weight yesterday-didn't weigh Last visit weight-222    Lacheryl Niesen, EMT Paramedic 08/25/2016    ACTION: Home visit completed

## 2016-08-25 NOTE — Telephone Encounter (Signed)
Renewed Ms[employer] [place] PAN foundation grant for assistance with Bidil and Entresto copays.   Member ID: [id number] Group ID: [id number] RxBin ID: [id number] PCN: [id number] Eligibility Start Date: 08/25/2016 Eligibility End Date: 08/24/2017 Assistance Amount: $800.00   Doroteo Bradford K. [name], PharmD, BCPS, CPP Clinical Pharmacist Pager: [phone] Phone: [phone] 08/25/2016 1:52 PM

## 2016-09-01 ENCOUNTER — Other Ambulatory Visit (HOSPITAL_COMMUNITY): Payer: Self-pay

## 2016-09-01 LAB — CUP PACEART REMOTE DEVICE CHECK
Battery Voltage: 3.03 V
Brady Statistic RV Percent Paced: 0.01 %
Date Time Interrogation Session: 20180711052504
HighPow Impedance: 78 Ohm
Lead Channel Impedance Value: 380 Ohm
Lead Channel Impedance Value: 494 Ohm
Lead Channel Pacing Threshold Pulse Width: 0.4 ms
Lead Channel Sensing Intrinsic Amplitude: 19.125 mV
Lead Channel Setting Sensing Sensitivity: 0.3 mV
MDC IDC LEAD IMPLANT DT: 20170215
MDC IDC LEAD LOCATION: 753860
MDC IDC MSMT BATTERY REMAINING LONGEVITY: 131 mo
MDC IDC MSMT LEADCHNL RV PACING THRESHOLD AMPLITUDE: 0.625 V
MDC IDC MSMT LEADCHNL RV SENSING INTR AMPL: 19.125 mV
MDC IDC PG IMPLANT DT: 20170215
MDC IDC SET LEADCHNL RV PACING AMPLITUDE: 2.5 V
MDC IDC SET LEADCHNL RV PACING PULSEWIDTH: 0.4 ms

## 2016-09-01 NOTE — Progress Notes (Signed)
Paramedicine Encounter    Patient ID: Tara Dunn, female    DOB: May 09, 1940, 76 y.o.   MRN: [id number]    Patient Care Team: Fanny Bien, MD as PCP - General (Family Medicine)  Patient Active Problem List   Diagnosis Date Noted  . Joint pain 12/09/2015  . ICD (implantable cardioverter-defibrillator) in place 03/26/2015  . Syncope 03/06/2015  . Obesity (BMI 30-39.9) 01/21/2015  . Excessive daytime sleepiness 08/04/2014  . OSA (obstructive sleep apnea) 08/04/2014  . Essential hypertension 06/13/2014  . Chronic systolic CHF (congestive heart failure) ([place]) 05/20/2014  . Congestive heart disease ([place])   . CKD (chronic kidney disease), stage III 05/03/2014  . Hx of stroke without residual deficits 05/03/2014    Current Outpatient Prescriptions:  .  allopurinol (ZYLOPRIM) 100 MG tablet, Take 200 mg by mouth daily. , Disp: , Rfl: 0 .  aspirin EC 81 MG tablet, Take 1 tablet (81 mg total) by mouth daily., Disp: 90 tablet, Rfl: 3 .  atorvastatin (LIPITOR) 40 MG tablet, Take 1 tablet (40 mg total) by mouth daily., Disp: 90 tablet, Rfl: 3 .  carvedilol (COREG) 25 MG tablet, Take 1 tablet (25 mg total) by mouth 2 (two) times daily., Disp: 180 tablet, Rfl: 3 .  colchicine 0.6 MG tablet, Take 2 tablets (1.2 mg) at first sign of gout flare followed by 1 tablet (0.6 mg) 1 hour later, Disp: 6 tablet, Rfl: 0 .  furosemide (LASIX) 20 MG tablet, Take 1 tablet (20 mg total) by mouth daily., Disp: 30 tablet, Rfl: 5 .  gabapentin (NEURONTIN) 300 MG capsule, Take 300 mg by mouth 3 (three) times daily., Disp: , Rfl:  .  isosorbide-hydrALAZINE (BIDIL) 20-37.5 MG tablet, Take 2 tablets by mouth 3 (three) times daily., Disp: 180 tablet, Rfl: 5 .  omeprazole (PRILOSEC) 40 MG capsule, Take 40 mg by mouth daily., Disp: , Rfl: 0 .  sacubitril-valsartan (ENTRESTO) 97-103 MG, Take 1 tablet by mouth 2 (two) times daily., Disp: 60 tablet, Rfl: 5 .  spironolactone (ALDACTONE) 25 MG tablet, Take 1 tablet  (25 mg total) by mouth daily., Disp: 90 tablet, Rfl: 3 .  naproxen sodium (ANAPROX) 220 MG tablet, Take 220 mg by mouth daily as needed (back pain). Reported on 08/26/2015, Disp: , Rfl:  .  traMADol (ULTRAM) 50 MG tablet, Take 1 tablet (50 mg total) by mouth every 12 (twelve) hours as needed (twice daily as needed for pain). (Patient not taking: Reported on 07/15/2016), Disp: 60 tablet, Rfl: 0 Allergies  Allergen Reactions  . Shellfish Allergy Hives     Social History   Social History  . Marital status: Widowed    Spouse name: N/A  . Number of children: N/A  . Years of education: N/A   Occupational History  . Not on file.   Social History Main Topics  . Smoking status: Former Smoker    Packs/day: 0.50    Years: 40.00    Types: Cigarettes  . Smokeless tobacco: Never Used     Comment: Quit in 2009.  Marland Kitchen Alcohol use No     Comment: "drank occasionally when I was young; last drink was in the late 1990s"  . Drug use: No  . Sexual activity: No   Other Topics Concern  . Not on file   Social History Narrative   Lives alone in an independent living facility.  Education: 10th grade.     Retired from [employer] work.  Moved here from [place] in 2015.  Physical Exam  Pulmonary/Chest: No respiratory distress. She has no wheezes. She has no rales.  Abdominal: There is no tenderness. There is no guarding.  Musculoskeletal: She exhibits tenderness.  pts knee pain increased with palpation and movement  Skin: Skin is warm and dry. She is not diaphoretic.        Future Appointments Date Time Provider [place]  10/08/2016 10:00 AM [facility] PA/NP [facility] None  11/17/2016 9:15 AM [facility] DEVICE REMOTES [facility]    ATF [name] x4, c/o pain in both of her knees and toes.  She stated that it could be gout but it's raining therefore it could be artheritis.  Pt missed her meds on Thursday night last week and last night because she fell asleep.  She denies sob, dizziness,  headache and chest pain.  She stated that she missed swimming classes this week and hasn't been doing her normal activity due to the pain.  I gave her 2 colchicine and placed 1 pill in all of her evening slots due to the gout pain she is expericing.  I also showed her which medication it is in case she didn't want to take it or if she needed another in an hour. rx bottles verified and pill box refilled. She didn't weigh yesterday due to being in pain.  Pt hasn't fallen recently.   **rx called in: Carvedilol Atorvastatin  BP 110/80 (BP Location: Right Arm, Patient Position: Sitting, Cuff Size: Large)   Pulse 63   Resp 16   Wt 222 lb 3.2 oz (100.8 kg)   SpO2 97%   BMI 39.36 kg/m   Weight yesterday- Last visit weight-222    Geneieve Duell, EMT Paramedic 09/01/2016    ACTION: Home visit completed

## 2016-09-07 ENCOUNTER — Other Ambulatory Visit: Payer: Self-pay | Admitting: Family Medicine

## 2016-09-07 ENCOUNTER — Ambulatory Visit
Admission: RE | Admit: 2016-09-07 | Discharge: 2016-09-07 | Disposition: A | Discharge status: Home / Self Care | Payer: PPO | Source: Ambulatory Visit | Attending: Family Medicine | Admitting: Family Medicine

## 2016-09-07 DIAGNOSIS — I10 Essential (primary) hypertension: Secondary | ICD-10-CM | POA: Diagnosis not present

## 2016-09-07 DIAGNOSIS — I509 Heart failure, unspecified: Secondary | ICD-10-CM | POA: Diagnosis not present

## 2016-09-07 DIAGNOSIS — M170 Bilateral primary osteoarthritis of knee: Secondary | ICD-10-CM

## 2016-09-07 DIAGNOSIS — M109 Gout, unspecified: Secondary | ICD-10-CM | POA: Diagnosis not present

## 2016-09-07 DIAGNOSIS — N181 Chronic kidney disease, stage 1: Secondary | ICD-10-CM | POA: Diagnosis not present

## 2016-09-07 DIAGNOSIS — M1711 Unilateral primary osteoarthritis, right knee: Secondary | ICD-10-CM | POA: Diagnosis not present

## 2016-09-07 DIAGNOSIS — M1712 Unilateral primary osteoarthritis, left knee: Secondary | ICD-10-CM | POA: Diagnosis not present

## 2016-09-08 ENCOUNTER — Other Ambulatory Visit (HOSPITAL_COMMUNITY): Payer: Self-pay

## 2016-09-08 ENCOUNTER — Telehealth (HOSPITAL_COMMUNITY): Payer: Self-pay | Admitting: *Deleted

## 2016-09-08 NOTE — Telephone Encounter (Signed)
Dee called with Paramed stating that patient had gained 3 lbs overnight and BP is 160/100.  Spoke with Jettie Booze, NP and she advises patient to take 40 of lasix twice daily today and tomorrow and have Karena Addison go and recheck on Friday.  Karena Addison is aware and will discuss the plan with patient.

## 2016-09-08 NOTE — Progress Notes (Signed)
Paramedicine Encounter    Patient ID: Tara Dunn, female    DOB: Aug 31, 1940, 76 y.o.   MRN: [id number]    Patient Care Team: Fanny Bien, MD as PCP - General (Family Medicine)  Patient Active Problem List   Diagnosis Date Noted  . Joint pain 12/09/2015  . ICD (implantable cardioverter-defibrillator) in place 03/26/2015  . Syncope 03/06/2015  . Obesity (BMI 30-39.9) 01/21/2015  . Excessive daytime sleepiness 08/04/2014  . OSA (obstructive sleep apnea) 08/04/2014  . Essential hypertension 06/13/2014  . Chronic systolic CHF (congestive heart failure) ([place]) 05/20/2014  . Congestive heart disease ([place])   . CKD (chronic kidney disease), stage III 05/03/2014  . Hx of stroke without residual deficits 05/03/2014    Current Outpatient Prescriptions:  .  allopurinol (ZYLOPRIM) 100 MG tablet, Take 200 mg by mouth daily. , Disp: , Rfl: 0 .  aspirin EC 81 MG tablet, Take 1 tablet (81 mg total) by mouth daily., Disp: 90 tablet, Rfl: 3 .  atorvastatin (LIPITOR) 40 MG tablet, Take 1 tablet (40 mg total) by mouth daily., Disp: 90 tablet, Rfl: 3 .  carvedilol (COREG) 25 MG tablet, Take 1 tablet (25 mg total) by mouth 2 (two) times daily., Disp: 180 tablet, Rfl: 3 .  furosemide (LASIX) 20 MG tablet, Take 1 tablet (20 mg total) by mouth daily., Disp: 30 tablet, Rfl: 5 .  gabapentin (NEURONTIN) 300 MG capsule, Take 300 mg by mouth 3 (three) times daily., Disp: , Rfl:  .  isosorbide-hydrALAZINE (BIDIL) 20-37.5 MG tablet, Take 2 tablets by mouth 3 (three) times daily., Disp: 180 tablet, Rfl: 5 .  omeprazole (PRILOSEC) 40 MG capsule, Take 40 mg by mouth daily., Disp: , Rfl: 0 .  sacubitril-valsartan (ENTRESTO) 97-103 MG, Take 1 tablet by mouth 2 (two) times daily., Disp: 60 tablet, Rfl: 5 .  spironolactone (ALDACTONE) 25 MG tablet, Take 1 tablet (25 mg total) by mouth daily., Disp: 90 tablet, Rfl: 3 .  colchicine 0.6 MG tablet, Take 2 tablets (1.2 mg) at first sign of gout flare followed by 1  tablet (0.6 mg) 1 hour later, Disp: 6 tablet, Rfl: 0 .  naproxen sodium (ANAPROX) 220 MG tablet, Take 220 mg by mouth daily as needed (back pain). Reported on 08/26/2015, Disp: , Rfl:  .  traMADol (ULTRAM) 50 MG tablet, Take 1 tablet (50 mg total) by mouth every 12 (twelve) hours as needed (twice daily as needed for pain). (Patient not taking: Reported on 07/15/2016), Disp: 60 tablet, Rfl: 0 Allergies  Allergen Reactions  . Shellfish Allergy Hives     Social History   Social History  . Marital status: Widowed    Spouse name: N/A  . Number of children: N/A  . Years of education: N/A   Occupational History  . Not on file.   Social History Main Topics  . Smoking status: Former Smoker    Packs/day: 0.50    Years: 40.00    Types: Cigarettes  . Smokeless tobacco: Never Used     Comment: Quit in 2009.  Marland Kitchen Alcohol use No     Comment: "drank occasionally when I was young; last drink was in the late 1990s"  . Drug use: No  . Sexual activity: No   Other Topics Concern  . Not on file   Social History Narrative   Lives alone in an independent living facility.  Education: 10th grade.     Retired from [employer] work.  Moved here from [place] in 2015.  Physical Exam  Pulmonary/Chest: No respiratory distress. She has no wheezes. She has no rales.  Abdominal: She exhibits no distension. There is no guarding.  Musculoskeletal: She exhibits edema.  +2 both lower legs above ankle  Skin: Skin is warm and dry. She is not diaphoretic.        Future Appointments Date Time Provider [place]  10/08/2016 10:00 AM [facility] PA/NP [facility] None  11/17/2016 9:15 AM [facility] DEVICE REMOTES [facility]    ATF [name] x4 sitting in the living room watching tv.  Pt stated that she got two cortisone shots in both knees yesterday.  [facility] but didn't go swimming due to knee pain. Pt reports that her weight is up today and she thought that her scale was  broken.  She weighed the same when she weighed in front of me.  Pt doesn't think that she ate anything with a lot of sodium in it.  It's possibly due to her drinking more fluids.  She denies sob, dizziness, headache and chest pain. I notified [facility] clinic of pts weight gain and bp via vm.  She hasn't taken her afternoon meds yet and she missed her meds from last night which includes lasix.  We talked about fluid restrictions and remembering her evening meds.  rx bottles verified and pill box refilled.    BP (!) 160/100 (BP Location: Right Arm, Patient Position: Sitting, Cuff Size: Large)   Pulse 77   Resp 16   Wt 226 lb 12.8 oz (102.9 kg)   SpO2 99%   BMI 40.18 kg/m   Weight yesterday-223 Last visit weight-222    Tara Dunn, EMT Paramedic 09/08/2016    ACTION: Home visit completed

## 2016-09-13 DIAGNOSIS — I10 Essential (primary) hypertension: Secondary | ICD-10-CM | POA: Diagnosis not present

## 2016-09-13 NOTE — Progress Notes (Signed)
[facility] PRE[facility] Weekly Session   Patient Details  Name: Tara Dunn MRN: [id number] Date of Birth: 03/03/1940 Age: 76 y.o. PCP: Fanny Bien, MD  Vitals:   09/13/16 1438  Weight: 223 lb 6.4 oz (101.3 kg)        [facility] Weekly seesion - 09/13/16 1400      Weekly Session   Topic Discussed Water   Classes attended to date [place] 09/13/2016, 2:38 PM

## 2016-09-14 DIAGNOSIS — N19 Unspecified kidney failure: Secondary | ICD-10-CM | POA: Diagnosis not present

## 2016-09-14 DIAGNOSIS — M170 Bilateral primary osteoarthritis of knee: Secondary | ICD-10-CM | POA: Diagnosis not present

## 2016-09-14 DIAGNOSIS — I639 Cerebral infarction, unspecified: Secondary | ICD-10-CM | POA: Diagnosis not present

## 2016-09-14 DIAGNOSIS — I10 Essential (primary) hypertension: Secondary | ICD-10-CM | POA: Diagnosis not present

## 2016-09-15 NOTE — Progress Notes (Signed)
[facility] Weekly Session   Patient Details  Name: Tara Dunn MRN: [id number] Date of Birth: [birth year] Age: 76 y.o. PCP: Fanny Bien, MD  Vitals:   09/15/16 1325  Weight: 223 lb 6.4 oz (101.3 kg)        [facility] Weekly seesion - 09/15/16 1300      Weekly Session   Topic Discussed Hitting roadblocks   Classes attended to date [place] 09/15/2016, 1:25 PM

## 2016-09-16 ENCOUNTER — Other Ambulatory Visit (HOSPITAL_COMMUNITY): Payer: Self-pay

## 2016-09-16 NOTE — Progress Notes (Signed)
Paramedicine Encounter    Patient ID: Tara Dunn, female    DOB: May 09, 1940, 76 y.o.   MRN: [id number]    Patient Care Team: Fanny Bien, MD as PCP - General (Family Medicine)  Patient Active Problem List   Diagnosis Date Noted  . Joint pain 12/09/2015  . ICD (implantable cardioverter-defibrillator) in place 03/26/2015  . Syncope 03/06/2015  . Obesity (BMI 30-39.9) 01/21/2015  . Excessive daytime sleepiness 08/04/2014  . OSA (obstructive sleep apnea) 08/04/2014  . Essential hypertension 06/13/2014  . Chronic systolic CHF (congestive heart failure) ([place]) 05/20/2014  . Congestive heart disease ([place])   . CKD (chronic kidney disease), stage III 05/03/2014  . Hx of stroke without residual deficits 05/03/2014    Current Outpatient Prescriptions:  .  allopurinol (ZYLOPRIM) 100 MG tablet, Take 200 mg by mouth daily. , Disp: , Rfl: 0 .  aspirin EC 81 MG tablet, Take 1 tablet (81 mg total) by mouth daily., Disp: 90 tablet, Rfl: 3 .  atorvastatin (LIPITOR) 40 MG tablet, Take 1 tablet (40 mg total) by mouth daily., Disp: 90 tablet, Rfl: 3 .  carvedilol (COREG) 25 MG tablet, Take 1 tablet (25 mg total) by mouth 2 (two) times daily., Disp: 180 tablet, Rfl: 3 .  colchicine 0.6 MG tablet, Take 2 tablets (1.2 mg) at first sign of gout flare followed by 1 tablet (0.6 mg) 1 hour later, Disp: 6 tablet, Rfl: 0 .  furosemide (LASIX) 20 MG tablet, Take 1 tablet (20 mg total) by mouth daily., Disp: 30 tablet, Rfl: 5 .  gabapentin (NEURONTIN) 300 MG capsule, Take 300 mg by mouth 3 (three) times daily., Disp: , Rfl:  .  isosorbide-hydrALAZINE (BIDIL) 20-37.5 MG tablet, Take 2 tablets by mouth 3 (three) times daily., Disp: 180 tablet, Rfl: 5 .  omeprazole (PRILOSEC) 40 MG capsule, Take 40 mg by mouth daily., Disp: , Rfl: 0 .  sacubitril-valsartan (ENTRESTO) 97-103 MG, Take 1 tablet by mouth 2 (two) times daily., Disp: 60 tablet, Rfl: 5 .  spironolactone (ALDACTONE) 25 MG tablet, Take 1 tablet  (25 mg total) by mouth daily., Disp: 90 tablet, Rfl: 3 .  naproxen sodium (ANAPROX) 220 MG tablet, Take 220 mg by mouth daily as needed (back pain). Reported on 08/26/2015, Disp: , Rfl:  .  traMADol (ULTRAM) 50 MG tablet, Take 1 tablet (50 mg total) by mouth every 12 (twelve) hours as needed (twice daily as needed for pain). (Patient not taking: Reported on 07/15/2016), Disp: 60 tablet, Rfl: 0 Allergies  Allergen Reactions  . Shellfish Allergy Hives     Social History   Social History  . Marital status: Widowed    Spouse name: N/A  . Number of children: N/A  . Years of education: N/A   Occupational History  . Not on file.   Social History Main Topics  . Smoking status: Former Smoker    Packs/day: 0.50    Years: 40.00    Types: Cigarettes  . Smokeless tobacco: Never Used     Comment: Quit in 2009.  Marland Kitchen Alcohol use No     Comment: "drank occasionally when I was young; last drink was in the late 1990s"  . Drug use: No  . Sexual activity: No   Other Topics Concern  . Not on file   Social History Narrative   Lives alone in an independent living facility.  Education: 10th grade.     Retired from [employer] work.  Moved here from [place] in 2015.  Physical Exam  Pulmonary/Chest: No respiratory distress. She has no wheezes. She has no rales.  Abdominal: She exhibits no distension. There is no tenderness. There is no guarding.  Musculoskeletal: She exhibits no edema.  Skin: Skin is warm and dry. She is not diaphoretic.        Future Appointments Date Time Provider [place]  10/08/2016 10:00 AM [facility] PA/NP [facility] None  11/17/2016 9:15 AM [facility] DEVICE REMOTES [facility]    ATF [name] x4 cleaning up her apartment c/o right knee pain.  Pt stated that she went to her PCP this past tues and advised her to get shots in her knee.  She stated that she understand that the shots is supposed to help replace calogen in her knee.  She's expecting to start the shots  around 8/23. Pt has no other complaints today.  She denies sob, dizziness, headache and chest pain. Pt has taken all of her medications this week without difficulty.  Pt is continuing to watch what she eats and her fluid intake.  She had to take an extra lasix during our last visit, she lost about 5lbs.  rx bottles verified and pill box refilled.    BP 122/80 (BP Location: Right Arm, Patient Position: Sitting, Cuff Size: Normal)   Pulse 67   Resp 12   Wt 221 lb (100.2 kg)   SpO2 98%   BMI 39.15 kg/m   Weight yesterday-221 Last visit weight-226    Tieshia Rettinger, EMT Paramedic 09/16/2016    ACTION: Home visit completed

## 2016-09-21 ENCOUNTER — Other Ambulatory Visit (HOSPITAL_COMMUNITY): Payer: Self-pay

## 2016-09-21 NOTE — Progress Notes (Signed)
Paramedicine Encounter    Patient ID: Tara Dunn, female    DOB: May 09, 1940, 76 y.o.   MRN: [id number]    Patient Care Team: Fanny Bien, MD as PCP - General (Family Medicine)  Patient Active Problem List   Diagnosis Date Noted  . Joint pain 12/09/2015  . ICD (implantable cardioverter-defibrillator) in place 03/26/2015  . Syncope 03/06/2015  . Obesity (BMI 30-39.9) 01/21/2015  . Excessive daytime sleepiness 08/04/2014  . OSA (obstructive sleep apnea) 08/04/2014  . Essential hypertension 06/13/2014  . Chronic systolic CHF (congestive heart failure) ([place]) 05/20/2014  . Congestive heart disease ([place])   . CKD (chronic kidney disease), stage III 05/03/2014  . Hx of stroke without residual deficits 05/03/2014    Current Outpatient Prescriptions:  .  allopurinol (ZYLOPRIM) 100 MG tablet, Take 200 mg by mouth daily. , Disp: , Rfl: 0 .  aspirin EC 81 MG tablet, Take 1 tablet (81 mg total) by mouth daily., Disp: 90 tablet, Rfl: 3 .  atorvastatin (LIPITOR) 40 MG tablet, Take 1 tablet (40 mg total) by mouth daily., Disp: 90 tablet, Rfl: 3 .  carvedilol (COREG) 25 MG tablet, Take 1 tablet (25 mg total) by mouth 2 (two) times daily., Disp: 180 tablet, Rfl: 3 .  colchicine 0.6 MG tablet, Take 2 tablets (1.2 mg) at first sign of gout flare followed by 1 tablet (0.6 mg) 1 hour later, Disp: 6 tablet, Rfl: 0 .  furosemide (LASIX) 20 MG tablet, Take 1 tablet (20 mg total) by mouth daily., Disp: 30 tablet, Rfl: 5 .  gabapentin (NEURONTIN) 300 MG capsule, Take 300 mg by mouth 3 (three) times daily., Disp: , Rfl:  .  isosorbide-hydrALAZINE (BIDIL) 20-37.5 MG tablet, Take 2 tablets by mouth 3 (three) times daily., Disp: 180 tablet, Rfl: 5 .  omeprazole (PRILOSEC) 40 MG capsule, Take 40 mg by mouth daily., Disp: , Rfl: 0 .  sacubitril-valsartan (ENTRESTO) 97-103 MG, Take 1 tablet by mouth 2 (two) times daily., Disp: 60 tablet, Rfl: 5 .  spironolactone (ALDACTONE) 25 MG tablet, Take 1 tablet  (25 mg total) by mouth daily., Disp: 90 tablet, Rfl: 3 .  naproxen sodium (ANAPROX) 220 MG tablet, Take 220 mg by mouth daily as needed (back pain). Reported on 08/26/2015, Disp: , Rfl:  .  traMADol (ULTRAM) 50 MG tablet, Take 1 tablet (50 mg total) by mouth every 12 (twelve) hours as needed (twice daily as needed for pain). (Patient not taking: Reported on 07/15/2016), Disp: 60 tablet, Rfl: 0 Allergies  Allergen Reactions  . Shellfish Allergy Hives     Social History   Social History  . Marital status: Widowed    Spouse name: N/A  . Number of children: N/A  . Years of education: N/A   Occupational History  . Not on file.   Social History Main Topics  . Smoking status: Former Smoker    Packs/day: 0.50    Years: 40.00    Types: Cigarettes  . Smokeless tobacco: Never Used     Comment: Quit in 2009.  Marland Kitchen Alcohol use No     Comment: "drank occasionally when I was young; last drink was in the late 1990s"  . Drug use: No  . Sexual activity: No   Other Topics Concern  . Not on file   Social History Narrative   Lives alone in an independent living facility.  Education: 10th grade.     Retired from [employer] work.  Moved here from [place] in 2015.  Physical Exam  Pulmonary/Chest: No respiratory distress. She has no wheezes. She has no rales.  Abdominal: She exhibits no distension. There is no tenderness. There is no guarding.  Musculoskeletal: She exhibits no edema.  Skin: Skin is warm and dry. She is not diaphoretic.        Future Appointments Date Time Provider [place]  10/08/2016 10:00 AM [facility] PA/NP [facility] None  11/17/2016 9:15 AM [facility] DEVICE REMOTES [facility]    ATF [name] x4 sitting on the couch c/o knee pain. Pt has hx of chronic knee pain and she hasn't taken any medications today for pain.  Pt has taken all of her medications today.  She missed meds on both sat and sun because she left her pill box at home when she stayed the weekend with  her sister.  She denies sob, dizziness, headache and chest pain today.  rx bottles verified and pill box refilled.    BP 138/80 (BP Location: Right Arm, Patient Position: Sitting, Cuff Size: Large)   Pulse 76   Resp 12   Wt 220 lb 9.6 oz (100.1 kg)   SpO2 98%   BMI 39.08 kg/m   Weight yesterday-didn't weigh Last visit weight-221    Jahnia Hewes, EMT Paramedic 09/21/2016    ACTION: Home visit completed

## 2016-09-29 ENCOUNTER — Other Ambulatory Visit (HOSPITAL_COMMUNITY): Payer: Self-pay

## 2016-09-29 NOTE — Progress Notes (Signed)
Paramedicine Encounter    Patient ID: Tara Dunn, female    DOB: May 09, 1940, 76 y.o.   MRN: [id number]    Patient Care Team: Fanny Bien, MD as PCP - General (Family Medicine)  Patient Active Problem List   Diagnosis Date Noted  . Joint pain 12/09/2015  . ICD (implantable cardioverter-defibrillator) in place 03/26/2015  . Syncope 03/06/2015  . Obesity (BMI 30-39.9) 01/21/2015  . Excessive daytime sleepiness 08/04/2014  . OSA (obstructive sleep apnea) 08/04/2014  . Essential hypertension 06/13/2014  . Chronic systolic CHF (congestive heart failure) ([place]) 05/20/2014  . Congestive heart disease ([place])   . CKD (chronic kidney disease), stage III 05/03/2014  . Hx of stroke without residual deficits 05/03/2014    Current Outpatient Prescriptions:  .  allopurinol (ZYLOPRIM) 100 MG tablet, Take 200 mg by mouth daily. , Disp: , Rfl: 0 .  aspirin EC 81 MG tablet, Take 1 tablet (81 mg total) by mouth daily., Disp: 90 tablet, Rfl: 3 .  atorvastatin (LIPITOR) 40 MG tablet, Take 1 tablet (40 mg total) by mouth daily., Disp: 90 tablet, Rfl: 3 .  carvedilol (COREG) 25 MG tablet, Take 1 tablet (25 mg total) by mouth 2 (two) times daily., Disp: 180 tablet, Rfl: 3 .  colchicine 0.6 MG tablet, Take 2 tablets (1.2 mg) at first sign of gout flare followed by 1 tablet (0.6 mg) 1 hour later, Disp: 6 tablet, Rfl: 0 .  furosemide (LASIX) 20 MG tablet, Take 1 tablet (20 mg total) by mouth daily., Disp: 30 tablet, Rfl: 5 .  gabapentin (NEURONTIN) 300 MG capsule, Take 300 mg by mouth 3 (three) times daily., Disp: , Rfl:  .  isosorbide-hydrALAZINE (BIDIL) 20-37.5 MG tablet, Take 2 tablets by mouth 3 (three) times daily., Disp: 180 tablet, Rfl: 5 .  omeprazole (PRILOSEC) 40 MG capsule, Take 40 mg by mouth daily., Disp: , Rfl: 0 .  sacubitril-valsartan (ENTRESTO) 97-103 MG, Take 1 tablet by mouth 2 (two) times daily., Disp: 60 tablet, Rfl: 5 .  spironolactone (ALDACTONE) 25 MG tablet, Take 1 tablet  (25 mg total) by mouth daily., Disp: 90 tablet, Rfl: 3 .  naproxen sodium (ANAPROX) 220 MG tablet, Take 220 mg by mouth daily as needed (back pain). Reported on 08/26/2015, Disp: , Rfl:  .  traMADol (ULTRAM) 50 MG tablet, Take 1 tablet (50 mg total) by mouth every 12 (twelve) hours as needed (twice daily as needed for pain). (Patient not taking: Reported on 07/15/2016), Disp: 60 tablet, Rfl: 0 Allergies  Allergen Reactions  . Shellfish Allergy Hives     Social History   Social History  . Marital status: Widowed    Spouse name: N/A  . Number of children: N/A  . Years of education: N/A   Occupational History  . Not on file.   Social History Main Topics  . Smoking status: Former Smoker    Packs/day: 0.50    Years: 40.00    Types: Cigarettes  . Smokeless tobacco: Never Used     Comment: Quit in 2009.  Marland Kitchen Alcohol use No     Comment: "drank occasionally when I was young; last drink was in the late 1990s"  . Drug use: No  . Sexual activity: No   Other Topics Concern  . Not on file   Social History Narrative   Lives alone in an independent living facility.  Education: 10th grade.     Retired from [employer] work.  Moved here from [place] in 2015.  Physical Exam  Pulmonary/Chest: No respiratory distress. She has no wheezes. She has no rales.  Abdominal: She exhibits no distension. There is no tenderness. There is no guarding.  Musculoskeletal: She exhibits no edema.  Skin: Skin is warm and dry. She is not diaphoretic.        Future Appointments Date Time Provider [place]  10/08/2016 10:00 AM [facility] PA/NP [facility] None  11/17/2016 9:15 AM [facility] DEVICE REMOTES [facility]    ATF [name] x4 sitting on the couch c/o knee pain.  Pt has hx of chronic knee pain; she has a procedure scheduled for tomorrow. Pt skipped meds on Monday (afternoon and evening).  She is continuing to watch what she eats and how much shes drinking.  Pt has no other complaints. rx  bottles verified and pill  Box refilled.    BP 112/70 (BP Location: Right Arm, Patient Position: Sitting, Cuff Size: Large)   Pulse 74   Resp 12   Wt 217 lb 12.8 oz (98.8 kg)   SpO2 97%   BMI 38.58 kg/m   Weight yesterday-217.8 Last visit weight-220    Davonn Flanery, EMT Paramedic 09/29/2016    ACTION: Home visit completed

## 2016-09-30 DIAGNOSIS — I10 Essential (primary) hypertension: Secondary | ICD-10-CM | POA: Diagnosis not present

## 2016-09-30 DIAGNOSIS — M170 Bilateral primary osteoarthritis of knee: Secondary | ICD-10-CM | POA: Diagnosis not present

## 2016-09-30 DIAGNOSIS — M179 Osteoarthritis of knee, unspecified: Secondary | ICD-10-CM | POA: Diagnosis not present

## 2016-10-06 ENCOUNTER — Other Ambulatory Visit (HOSPITAL_COMMUNITY): Payer: Self-pay

## 2016-10-06 NOTE — Progress Notes (Signed)
Paramedicine Encounter    Patient ID: Tara Dunn, female    DOB: May 09, 1940, 76 y.o.   MRN: [id number]    Patient Care Team: Fanny Bien, MD as PCP - General (Family Medicine)  Patient Active Problem List   Diagnosis Date Noted  . Joint pain 12/09/2015  . ICD (implantable cardioverter-defibrillator) in place 03/26/2015  . Syncope 03/06/2015  . Obesity (BMI 30-39.9) 01/21/2015  . Excessive daytime sleepiness 08/04/2014  . OSA (obstructive sleep apnea) 08/04/2014  . Essential hypertension 06/13/2014  . Chronic systolic CHF (congestive heart failure) ([place]) 05/20/2014  . Congestive heart disease ([place])   . CKD (chronic kidney disease), stage III 05/03/2014  . Hx of stroke without residual deficits 05/03/2014    Current Outpatient Prescriptions:  .  allopurinol (ZYLOPRIM) 100 MG tablet, Take 200 mg by mouth daily. , Disp: , Rfl: 0 .  aspirin EC 81 MG tablet, Take 1 tablet (81 mg total) by mouth daily., Disp: 90 tablet, Rfl: 3 .  atorvastatin (LIPITOR) 40 MG tablet, Take 1 tablet (40 mg total) by mouth daily., Disp: 90 tablet, Rfl: 3 .  carvedilol (COREG) 25 MG tablet, Take 1 tablet (25 mg total) by mouth 2 (two) times daily., Disp: 180 tablet, Rfl: 3 .  colchicine 0.6 MG tablet, Take 2 tablets (1.2 mg) at first sign of gout flare followed by 1 tablet (0.6 mg) 1 hour later, Disp: 6 tablet, Rfl: 0 .  furosemide (LASIX) 20 MG tablet, Take 1 tablet (20 mg total) by mouth daily., Disp: 30 tablet, Rfl: 5 .  gabapentin (NEURONTIN) 300 MG capsule, Take 300 mg by mouth 3 (three) times daily., Disp: , Rfl:  .  isosorbide-hydrALAZINE (BIDIL) 20-37.5 MG tablet, Take 2 tablets by mouth 3 (three) times daily., Disp: 180 tablet, Rfl: 5 .  omeprazole (PRILOSEC) 40 MG capsule, Take 40 mg by mouth daily., Disp: , Rfl: 0 .  sacubitril-valsartan (ENTRESTO) 97-103 MG, Take 1 tablet by mouth 2 (two) times daily., Disp: 60 tablet, Rfl: 5 .  spironolactone (ALDACTONE) 25 MG tablet, Take 1 tablet  (25 mg total) by mouth daily., Disp: 90 tablet, Rfl: 3 .  naproxen sodium (ANAPROX) 220 MG tablet, Take 220 mg by mouth daily as needed (back pain). Reported on 08/26/2015, Disp: , Rfl:  .  traMADol (ULTRAM) 50 MG tablet, Take 1 tablet (50 mg total) by mouth every 12 (twelve) hours as needed (twice daily as needed for pain). (Patient not taking: Reported on 07/15/2016), Disp: 60 tablet, Rfl: 0 Allergies  Allergen Reactions  . Shellfish Allergy Hives     Social History   Social History  . Marital status: Widowed    Spouse name: N/A  . Number of children: N/A  . Years of education: N/A   Occupational History  . Not on file.   Social History Main Topics  . Smoking status: Former Smoker    Packs/day: 0.50    Years: 40.00    Types: Cigarettes  . Smokeless tobacco: Never Used     Comment: Quit in 2009.  Marland Kitchen Alcohol use No     Comment: "drank occasionally when I was young; last drink was in the late 1990s"  . Drug use: No  . Sexual activity: No   Other Topics Concern  . Not on file   Social History Narrative   Lives alone in an independent living facility.  Education: 10th grade.     Retired from [employer] work.  Moved here from [place] in 2015.  Physical Exam  Pulmonary/Chest: No respiratory distress. She has no wheezes. She has no rales.  Abdominal: She exhibits no distension. There is no tenderness. There is no guarding.  Musculoskeletal: She exhibits no edema.  Skin: Skin is warm and dry. She is not diaphoretic.        Future Appointments Date Time Provider [place]  10/08/2016 10:00 AM [facility] PA/NP [facility] None  11/17/2016 9:15 AM [facility] DEVICE REMOTES [facility]    ATF [name] x4 sitting on the couch watching the news. Pt has no complaints today, she received the injection in her knee this week.  She stated that she cant feel a difference yet.  She has missed all of her afternoon meds but she continues to take the am/pm meds.  She continues to  watch what she's eating and she cooks most meals.  Pt denies sob, dizziness, headache and chest pain. rx bottles verified and pill box refilled.     BP 118/70 (BP Location: Right Arm, Patient Position: Sitting, Cuff Size: Large)   Pulse 63   Resp 16   Wt 223 lb (101.2 kg)   SpO2 97%   BMI 39.50 kg/m   Weight yesterday-forgot to weigh Last visit weight-217    Zaire Levesque, EMT Paramedic 10/06/2016    ACTION: Home visit completed

## 2016-10-07 DIAGNOSIS — M170 Bilateral primary osteoarthritis of knee: Secondary | ICD-10-CM | POA: Diagnosis not present

## 2016-10-07 NOTE — Progress Notes (Signed)
Patient ID: Tara Dunn, female   DOB: [birth year], 76 y.o.   MRN: [id number]     Advanced Heart Failure Clinic Note   PCP: Dr. Ernie Hew HF: Dr. Aundra Dubin   76 yo with long history of HTN, CVAs, CKD, and systolic CHF.  She moved to [place] from [place] in 2015.   In 3/16, she was admitted a hypertensive emergency and acute CHF.  CTA chest showed no PE.  Echo showed EF 25-30% with moderate LVH.  She was diuresed and had a cardiac catheterization given extensive coronary calcification seen on CTA chest.  Cath showed diffuse moderate CAD, worst was 50-70% mRCA stenosis.   Todays she returns for HF follow up. Overall feeling ok. Denies SOB/PNd/Orthopnea. Weight at home stable. Appetite ok. Taking all medications except afternoon dose of bidil. Limited mobility due to knee pain. Followed by Paramedicince.   Optivol: fluid index flat. Activity less than 1 hours per day.   Labs (3/16): K 4.2, creatinine 1.44, HCT 39.7 Labs (06/05/14): K 4.1, creatinine 2.01 Labs (5/16): K 4.7, creatinine 1.56 Labs (7/16): K 4.7, creatinine 1.68, BNP 14, LDL 62 Labs (11/16): K 4.2, creatinine 1.93 Labs (1/17): K 4.6, creatinine 1.65, HCT 39.2 Labs (4/17): K 4.5, creatinine 1.98 Labs (6/17): K 4, creatinine 2 Labs (8/17): K 4.4, creatinine 1.64   Labs (10/17): K 3.9, creatinine 1.5, BNP 15 Labs (1/18): K 4.3, creatinine 1.59 Labs (4/18): LDL 46, HDL 57 Labs (5/18): K 4.6, creatinine 1.55  PMH: 1. CVA: 3 prior CVAs, last in 1998.  Thought to be related to HTN. 2. HTN: Long-standing, says she has been hypertensive since age 48. Renal artery dopplers (4/16) were normal.  3. CKD: Suspect related to HTN. 4. Breast cancer: 2009, left mastectomy with chemotherapy.  5. Gout 6. Obese 7. TAH 8. CAD: LHC (3/16) with moderate diffuse disease, worst lesion being 50-70% mid RCA.  9. Cardiomyopathy: Echo (3/16) with EF 25-30%, moderate LVH.  Echo (6/16) with EF 35-40%, moderate LVH, diffuse hypokinesis with  septal-lateral dyssynchrony.  Cardiac MRI (12/16) with EF 32%, diffuse hypokinesis, no contrast given due to CKD.  - Medtronic ICD 2/17.  - Echo (5/18): EF 40%, diffuse hypokinesis, moderate LVH, normal RV size and systolic function.  10. OSA: Using CPAP.  11. Event monitor (1/17) with NSR, occasional PVCs.   SH: From [place], moved to [place] in 2015 to be near daughter.  Single.  Quit smoking 2009.    FH: HTN runs in family.  Father with MI.   ROS: All systems reviewed and negative except as per HPI.   Current Outpatient Prescriptions  Medication Sig Dispense Refill  . allopurinol (ZYLOPRIM) 100 MG tablet Take 200 mg by mouth daily.   0  . aspirin EC 81 MG tablet Take 1 tablet (81 mg total) by mouth daily. 90 tablet 3  . atorvastatin (LIPITOR) 40 MG tablet Take 1 tablet (40 mg total) by mouth daily. 90 tablet 3  . carvedilol (COREG) 25 MG tablet Take 1 tablet (25 mg total) by mouth 2 (two) times daily. 180 tablet 3  . colchicine 0.6 MG tablet Take 0.6 mg by mouth daily.    . furosemide (LASIX) 20 MG tablet Take 1 tablet (20 mg total) by mouth daily. 30 tablet 5  . gabapentin (NEURONTIN) 300 MG capsule Take 300 mg by mouth 3 (three) times daily.    . isosorbide-hydrALAZINE (BIDIL) 20-37.5 MG tablet Take 2 tablets by mouth 3 (three) times daily. 180 tablet 5  . omeprazole (  PRILOSEC) 40 MG capsule Take 40 mg by mouth daily.  0  . sacubitril-valsartan (ENTRESTO) 97-103 MG Take 1 tablet by mouth 2 (two) times daily. 60 tablet 5  . spironolactone (ALDACTONE) 25 MG tablet Take 1 tablet (25 mg total) by mouth daily. 90 tablet 3  . traMADol (ULTRAM) 50 MG tablet Take 1 tablet (50 mg total) by mouth every 12 (twelve) hours as needed (twice daily as needed for pain). 60 tablet 0  . naproxen sodium (ANAPROX) 220 MG tablet Take 220 mg by mouth daily as needed (back pain). Reported on 08/26/2015     No current facility-administered medications for this encounter.    BP (!) 142/86 (BP Location: Left Arm,  Patient Position: Sitting, Cuff Size: Large)   Pulse 66   Wt 223 lb 6.4 oz (101.3 kg)   SpO2 97%   BMI 39.57 kg/m    Wt Readings from Last 3 Encounters:  10/08/16 223 lb 6.4 oz (101.3 kg)  10/06/16 223 lb (101.2 kg)  09/29/16 217 lb 12.8 oz (98.8 kg)   General:  Well appearing. No resp difficulty. Ambulated with walker.  HEENT: normal Neck: supple. JVP 5-6 . Carotids 2+ bilat; no bruits. No lymphadenopathy or thryomegaly appreciated. Cor: PMI nondisplaced. Regular rate & rhythm. No rubs, gallops or murmurs. Lungs: clear Abdomen: obese. soft, nontender, nondistended. No hepatosplenomegaly. No bruits or masses. Good bowel sounds. Extremities: no cyanosis, clubbing, rash, edema Neuro: alert & orientedx3, cranial nerves grossly intact. moves all 4 extremities w/o difficulty. Affect pleasant  Assessment/Plan: 1. HTN: Has history of CVA and CKD.  Renal artery dopplers were normal.  A little high but she has missed her medication mid day. P now controlled.  2. CAD: Moderate diffuse CAD, no severe obstruction.  This did not cause her cardiomyopathy. No chest pain.  - Continue ASA 81 - Continue atorvastatin 40 mg daily, good lipids in 4/18.    3. CKD III:  Check BMET .  - Avoid NSAID use.   4. 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  ECHO 06/2016 EF 40%. Medtronic ICD- optivol- fluid index flat. Activity< 1 hour per day.  NYHA II. Volume status stable. Continue lasix 40 mg daily. On goal dose bb and entresto. Continue spiro.  Continue bidil 1 tab three times day. Check BMET   -5. OSA: Uses CPAP.   6. Polypharmacy: Continue paramedicine.     Follow up 3 months.   Darrick Grinder, NP-C  10/07/2016

## 2016-10-08 ENCOUNTER — Ambulatory Visit (HOSPITAL_COMMUNITY)
Admission: RE | Admit: 2016-10-08 | Discharge: 2016-10-08 | Disposition: A | Discharge status: Home / Self Care | Payer: PPO | Source: Ambulatory Visit | Attending: Cardiology | Admitting: Cardiology

## 2016-10-08 ENCOUNTER — Encounter (HOSPITAL_COMMUNITY): Payer: Self-pay

## 2016-10-08 ENCOUNTER — Other Ambulatory Visit (HOSPITAL_COMMUNITY): Payer: Self-pay

## 2016-10-08 VITALS — BP 142/86 | HR 66 | Wt 223.4 lb

## 2016-10-08 DIAGNOSIS — I110 Hypertensive heart disease with heart failure: Secondary | ICD-10-CM

## 2016-10-08 DIAGNOSIS — Z87891 Personal history of nicotine dependence: Secondary | ICD-10-CM | POA: Diagnosis not present

## 2016-10-08 DIAGNOSIS — N183 Chronic kidney disease, stage 3 unspecified: Secondary | ICD-10-CM

## 2016-10-08 DIAGNOSIS — I2510 Atherosclerotic heart disease of native coronary artery without angina pectoris: Secondary | ICD-10-CM | POA: Diagnosis not present

## 2016-10-08 DIAGNOSIS — Z8673 Personal history of transient ischemic attack (TIA), and cerebral infarction without residual deficits: Secondary | ICD-10-CM | POA: Diagnosis not present

## 2016-10-08 DIAGNOSIS — Z7982 Long term (current) use of aspirin: Secondary | ICD-10-CM | POA: Insufficient documentation

## 2016-10-08 DIAGNOSIS — G4733 Obstructive sleep apnea (adult) (pediatric): Secondary | ICD-10-CM | POA: Diagnosis not present

## 2016-10-08 DIAGNOSIS — I429 Cardiomyopathy, unspecified: Secondary | ICD-10-CM | POA: Diagnosis not present

## 2016-10-08 DIAGNOSIS — I5022 Chronic systolic (congestive) heart failure: Secondary | ICD-10-CM | POA: Diagnosis not present

## 2016-10-08 DIAGNOSIS — Z9012 Acquired absence of left breast and nipple: Secondary | ICD-10-CM | POA: Insufficient documentation

## 2016-10-08 DIAGNOSIS — M109 Gout, unspecified: Secondary | ICD-10-CM | POA: Diagnosis not present

## 2016-10-08 DIAGNOSIS — I130 Hypertensive heart and chronic kidney disease with heart failure and stage 1 through stage 4 chronic kidney disease, or unspecified chronic kidney disease: Secondary | ICD-10-CM | POA: Diagnosis not present

## 2016-10-08 DIAGNOSIS — Z853 Personal history of malignant neoplasm of breast: Secondary | ICD-10-CM | POA: Diagnosis not present

## 2016-10-08 DIAGNOSIS — E669 Obesity, unspecified: Secondary | ICD-10-CM | POA: Insufficient documentation

## 2016-10-08 DIAGNOSIS — Z95810 Presence of automatic (implantable) cardiac defibrillator: Secondary | ICD-10-CM

## 2016-10-08 LAB — BASIC METABOLIC PANEL
ANION GAP: 8 (ref 5–15)
BUN: 39 mg/dL — ABNORMAL HIGH (ref 6–20)
CHLORIDE: 109 mmol/L (ref 101–111)
CO2: 21 mmol/L — ABNORMAL LOW (ref 22–32)
Calcium: 9.8 mg/dL (ref 8.9–10.3)
Creatinine, Ser: 1.93 mg/dL — ABNORMAL HIGH (ref 0.44–1.00)
GFR calc non Af Amer: 24 mL/min — ABNORMAL LOW (ref >60)
GFR, EST AFRICAN AMERICAN: 28 mL/min — AB (ref >60)
Glucose, Bld: 99 mg/dL (ref 65–99)
POTASSIUM: 4 mmol/L (ref 3.5–5.1)
SODIUM: 138 mmol/L (ref 135–145)

## 2016-10-08 NOTE — Progress Notes (Signed)
Paramedicine Encounter   Patient ID: Tara Dunn , female,   DOB: 02-Nov-1940,75 y.o.,  MRN: [id number]   Met patient in clinic today with provider Amy.   Amy advised pt that she doesn't have any fluid retention today.  Pt's blood pressure is still elevated therefore she need to take the afternoon dose of the bidil.  Pt didn't take meds prior to this visit.  No medications changes.    Time spent with patient 30  Franziska Podgurski, EMT-Paramedic 10/08/2016   ACTION: Home visit completed

## 2016-10-08 NOTE — Patient Instructions (Signed)
Labs today We will only contact you if something comes back abnormal or we need to make some changes. Otherwise no news is good news!  Your physician recommends that you schedule a follow-up appointment in: 3 months  Do the following things EVERYDAY: 1) Weigh yourself in the morning before breakfast. Write it down and keep it in a log. 2) Take your medicines as prescribed 3) Eat low salt foods-Limit salt (sodium) to 2000 mg per day.  4) Stay as active as you can everyday 5) Limit all fluids for the day to less than 2 liters

## 2016-10-08 NOTE — Progress Notes (Signed)
Advanced Heart Failure Medication Review by a Pharmacist  Does the patient  feel that his/her medications are working for him/her?  yes  Has the patient been experiencing any side effects to the medications prescribed?  no  Does the patient measure his/her own blood pressure or blood glucose at home?  yes   Does the patient have any problems obtaining medications due to transportation or finances?   no  Understanding of regimen: fair Understanding of indications: fair Potential of compliance: fair Patient understands to avoid NSAIDs. Patient understands to avoid decongestants.  Issues to address at subsequent visits: None   Pharmacist comments: Ms. Chenette is a pleasant 76 yo F presenting with Dee (paramedicine) and without her medication bottles. She reports fair compliance with her regimen but admits to missing her noon-time dose of her Bidil and gabapentin most days of the week. She did not have any specific medication-related questions or concerns for me at this time.   [name]. [name], PharmD, BCPS, CPP Clinical Pharmacist Pager: [phone] Phone: [phone] 10/08/2016 10:32 AM      Time with patient: 12 minutes Preparation and documentation time: 2 minutes Total time: 14 minutes

## 2016-10-12 ENCOUNTER — Other Ambulatory Visit (HOSPITAL_COMMUNITY): Payer: Self-pay

## 2016-10-12 NOTE — Progress Notes (Signed)
Paramedicine Encounter    Patient ID: Tara Dunn, female    DOB: Nov 03, 1940, 76 y.o.   MRN: [id number]    Patient Care Team: Fanny Bien, MD as PCP - General (Family Medicine)  Patient Active Problem List   Diagnosis Date Noted  . Joint pain 12/09/2015  . ICD (implantable cardioverter-defibrillator) in place 03/26/2015  . Syncope 03/06/2015  . Obesity (BMI 30-39.9) 01/21/2015  . Excessive daytime sleepiness 08/04/2014  . OSA (obstructive sleep apnea) 08/04/2014  . Essential hypertension 06/13/2014  . Chronic systolic CHF (congestive heart failure) ([place]) 05/20/2014  . Congestive heart disease ([employer])   . CKD (chronic kidney disease), stage III 05/03/2014  . Hx of stroke without residual deficits 05/03/2014    Current Outpatient Prescriptions:  .  allopurinol (ZYLOPRIM) 100 MG tablet, Take 200 mg by mouth daily. , Disp: , Rfl: 0 .  aspirin EC 81 MG tablet, Take 1 tablet (81 mg total) by mouth daily., Disp: 90 tablet, Rfl: 3 .  atorvastatin (LIPITOR) 40 MG tablet, Take 1 tablet (40 mg total) by mouth daily., Disp: 90 tablet, Rfl: 3 .  carvedilol (COREG) 25 MG tablet, Take 1 tablet (25 mg total) by mouth 2 (two) times daily., Disp: 180 tablet, Rfl: 3 .  colchicine 0.6 MG tablet, Take 0.6 mg by mouth daily., Disp: , Rfl:  .  furosemide (LASIX) 20 MG tablet, Take 1 tablet (20 mg total) by mouth daily., Disp: 30 tablet, Rfl: 5 .  gabapentin (NEURONTIN) 300 MG capsule, Take 300 mg by mouth 3 (three) times daily., Disp: , Rfl:  .  isosorbide-hydrALAZINE (BIDIL) 20-37.5 MG tablet, Take 2 tablets by mouth 3 (three) times daily., Disp: 180 tablet, Rfl: 5 .  omeprazole (PRILOSEC) 40 MG capsule, Take 40 mg by mouth daily., Disp: , Rfl: 0 .  sacubitril-valsartan (ENTRESTO) 97-103 MG, Take 1 tablet by mouth 2 (two) times daily., Disp: 60 tablet, Rfl: 5 .  spironolactone (ALDACTONE) 25 MG tablet, Take 1 tablet (25 mg total) by mouth daily., Disp: 90 tablet, Rfl: 3 .  naproxen sodium  (ANAPROX) 220 MG tablet, Take 220 mg by mouth daily as needed (back pain). Reported on 08/26/2015, Disp: , Rfl:  .  traMADol (ULTRAM) 50 MG tablet, Take 1 tablet (50 mg total) by mouth every 12 (twelve) hours as needed (twice daily as needed for pain)., Disp: 60 tablet, Rfl: 0 Allergies  Allergen Reactions  . Shellfish Allergy Hives     Social History   Social History  . Marital status: Widowed    Spouse name: N/A  . Number of children: N/A  . Years of education: N/A   Occupational History  . Not on file.   Social History Main Topics  . Smoking status: Former Smoker    Packs/day: 0.50    Years: 40.00    Types: Cigarettes  . Smokeless tobacco: Never Used     Comment: Quit in 2009.  Marland Kitchen Alcohol use No     Comment: "drank occasionally when I was young; last drink was in the late 1990s"  . Drug use: No  . Sexual activity: No   Other Topics Concern  . Not on file   Social History Narrative   Lives alone in an independent living facility.  Education: 10th grade.     Retired from [employer] work.  Moved here from [place] in 2015.    Physical Exam  Pulmonary/Chest: No respiratory distress. She has no wheezes. She has no rales.  Abdominal: She exhibits no distension.  There is no tenderness. There is no guarding.  Musculoskeletal: She exhibits no edema.  Skin: Skin is warm and dry. She is not diaphoretic.        Future Appointments Date Time Provider [place]  11/17/2016 9:15 AM [facility] DEVICE REMOTES [facility]  12/27/2016 10:00 AM [facility] PA/NP [facility] None    ATF [name] x4 drinking coffee and about to take her meds.  Pt has taken her meds this week without missing any doses.  Her knees is hurting this am and she hasn't taken anything for the pain.  She gets injections in her knees weekly which she stated that doesn't feel like is helping.  Pt denies sob, dizziness, and chest pain.  rx bottles verified and pill box refilled.    BP 128/80 (BP Location:  Right Arm, Patient Position: Sitting, Cuff Size: Large)   Pulse 79   Resp 12   Wt 221 lb (100.2 kg)   SpO2 98%   BMI 39.15 kg/m   Weight yesterday-221 Last visit weight-223    Jahnessa Vanduyn, EMT Paramedic 10/12/2016    ACTION: Home visit completed

## 2016-10-13 ENCOUNTER — Telehealth (HOSPITAL_COMMUNITY): Payer: Self-pay | Admitting: Cardiology

## 2016-10-13 MED ORDER — FUROSEMIDE 20 MG PO TABS: 40.0000 mg | ORAL_TABLET | ORAL | 5 refills | Status: DC

## 2016-10-13 NOTE — Addendum Note (Signed)
Addended by: JEFFRIES, Sharlot Gowda on: 10/13/2016 11:41 AM   Modules accepted: Orders

## 2016-10-13 NOTE — Telephone Encounter (Signed)
-----   Message from Conrad McGovern, NP sent at 10/12/2016  2:17 PM EDT ----- Renal function elevated. Please call hold lasix for 2 days then start 40 mg po lasix every other day.

## 2016-10-13 NOTE — Addendum Note (Signed)
Encounter addended by: Kerry Dory, CMA on: 10/13/2016 11:38 AM<BR>    Actions taken: Result note filed

## 2016-10-13 NOTE — Telephone Encounter (Signed)
Patient aware. Patient voiced understanding  

## 2016-10-14 DIAGNOSIS — Z23 Encounter for immunization: Secondary | ICD-10-CM | POA: Diagnosis not present

## 2016-10-14 DIAGNOSIS — M170 Bilateral primary osteoarthritis of knee: Secondary | ICD-10-CM | POA: Diagnosis not present

## 2016-10-26 ENCOUNTER — Telehealth (HOSPITAL_COMMUNITY): Payer: Self-pay | Admitting: *Deleted

## 2016-10-26 MED ORDER — SACUBITRIL-VALSARTAN 97-103 MG PO TABS: 1.0000 | ORAL_TABLET | Freq: Two times a day (BID) | ORAL | 0 refills | Status: DC

## 2016-10-26 MED ORDER — CARVEDILOL 25 MG PO TABS: 25.0000 mg | ORAL_TABLET | Freq: Two times a day (BID) | ORAL | 0 refills | Status: DC

## 2016-10-26 MED ORDER — ISOSORB DINITRATE-HYDRALAZINE 20-37.5 MG PO TABS: 2.0000 | ORAL_TABLET | Freq: Three times a day (TID) | ORAL | 0 refills | Status: DC

## 2016-10-26 MED ORDER — SPIRONOLACTONE 25 MG PO TABS: 25.0000 mg | ORAL_TABLET | Freq: Every day | ORAL | 0 refills | Status: DC

## 2016-10-26 MED ORDER — ATORVASTATIN CALCIUM 40 MG PO TABS: 40.0000 mg | ORAL_TABLET | Freq: Every day | ORAL | 0 refills | Status: DC

## 2016-10-26 MED ORDER — FUROSEMIDE 20 MG PO TABS: 40.0000 mg | ORAL_TABLET | ORAL | 0 refills | Status: DC

## 2016-10-26 NOTE — Telephone Encounter (Signed)
Dee called to let us know pt is still out of town in [place] nad is about out of her meds.  She states pt is supposed to be home in a week or so but needs rx sent to [facility] in [place].  Month supply sent in.

## 2016-10-28 ENCOUNTER — Telehealth (HOSPITAL_COMMUNITY): Payer: Self-pay

## 2016-10-28 NOTE — Telephone Encounter (Signed)
Tara Dunn daughter called to ask that her prescriptions be sent to the [facility] in [place] because pt has to stay a few extra days.  I contacted the heart failure clinic and they were able to send the prescriptions to her.  I f/u with her daughter and sent her a screen shot on how pt takes her meds.

## 2016-11-02 ENCOUNTER — Other Ambulatory Visit (HOSPITAL_COMMUNITY): Payer: Self-pay

## 2016-11-02 NOTE — Progress Notes (Signed)
Paramedicine Encounter    Patient ID: Tara Dunn, female    DOB: [birth year], 76 y.o.   MRN: [id number]    Patient Care Team: Fanny Bien, MD as PCP - General (Family Medicine)  Patient Active Problem List   Diagnosis Date Noted  . Joint pain 12/09/2015  . ICD (implantable cardioverter-defibrillator) in place 03/26/2015  . Syncope 03/06/2015  . Obesity (BMI 30-39.9) 01/21/2015  . Excessive daytime sleepiness 08/04/2014  . OSA (obstructive sleep apnea) 08/04/2014  . Essential hypertension 06/13/2014  . Chronic systolic CHF (congestive heart failure) ([place]) 05/20/2014  . Congestive heart disease ([place])   . CKD (chronic kidney disease), stage III 05/03/2014  . Hx of stroke without residual deficits 05/03/2014    Current Outpatient Prescriptions:  .  allopurinol (ZYLOPRIM) 100 MG tablet, Take 200 mg by mouth daily. , Disp: , Rfl: 0 .  aspirin EC 81 MG tablet, Take 1 tablet (81 mg total) by mouth daily., Disp: 90 tablet, Rfl: 3 .  atorvastatin (LIPITOR) 40 MG tablet, Take 1 tablet (40 mg total) by mouth daily., Disp: 30 tablet, Rfl: 0 .  carvedilol (COREG) 25 MG tablet, Take 1 tablet (25 mg total) by mouth 2 (two) times daily., Disp: 60 tablet, Rfl: 0 .  colchicine 0.6 MG tablet, Take 0.6 mg by mouth daily., Disp: , Rfl:  .  furosemide (LASIX) 20 MG tablet, Take 2 tablets (40 mg total) by mouth every other day., Disp: 30 tablet, Rfl: 0 .  gabapentin (NEURONTIN) 300 MG capsule, Take 300 mg by mouth 3 (three) times daily., Disp: , Rfl:  .  isosorbide-hydrALAZINE (BIDIL) 20-37.5 MG tablet, Take 2 tablets by mouth 3 (three) times daily., Disp: 180 tablet, Rfl: 0 .  omeprazole (PRILOSEC) 40 MG capsule, Take 40 mg by mouth daily., Disp: , Rfl: 0 .  sacubitril-valsartan (ENTRESTO) 97-103 MG, Take 1 tablet by mouth 2 (two) times daily., Disp: 60 tablet, Rfl: 0 .  naproxen sodium (ANAPROX) 220 MG tablet, Take 220 mg by mouth daily as needed (back pain). Reported on 08/26/2015, Disp: ,  Rfl:  .  spironolactone (ALDACTONE) 25 MG tablet, Take 1 tablet (25 mg total) by mouth daily., Disp: 30 tablet, Rfl: 0 .  traMADol (ULTRAM) 50 MG tablet, Take 1 tablet (50 mg total) by mouth every 12 (twelve) hours as needed (twice daily as needed for pain)., Disp: 60 tablet, Rfl: 0 Allergies  Allergen Reactions  . Shellfish Allergy Hives     Social History   Social History  . Marital status: Widowed    Spouse name: N/A  . Number of children: N/A  . Years of education: N/A   Occupational History  . Not on file.   Social History Main Topics  . Smoking status: Former Smoker    Packs/day: 0.50    Years: 40.00    Types: Cigarettes  . Smokeless tobacco: Never Used     Comment: Quit in 2009.  Marland Kitchen Alcohol use No     Comment: "drank occasionally when I was young; last drink was in the late 1990s"  . Drug use: No  . Sexual activity: No   Other Topics Concern  . Not on file   Social History Narrative   Lives alone in an independent living facility.  Education: 10th grade.     Retired from [employer] work.  Moved here from [place] in 2015.    Physical Exam  Pulmonary/Chest: No respiratory distress. She has no wheezes. She has no rales.  Abdominal: She exhibits  no distension. There is no tenderness. There is no guarding.  Musculoskeletal: She exhibits edema.  Edema in both feet  Skin: Skin is warm and dry. She is not diaphoretic.        Future Appointments Date Time Provider [place]  11/17/2016 9:15 AM [facility] DEVICE REMOTES [facility]  12/27/2016 10:00 AM [facility] PA/NP [facility] None    ATF [name] x4 sleeping on the couch. Pt Stated that she refilled her own pill box last week when she was in [place].  Pt's only cc today is knee pain. Pt has hx of bilateral knee pain.  Pt has an appointment to see the physician tomorrow for the injections in her knee.  Pt's vitals noted. rx bottles verified and pill box refilled.  Pt has extra supply of meds due to her getting  the refills in [place].  I bagged them and put the date on the bag for less confusion.    BP 110/70   Pulse 80   Resp 16   Wt 219 lb (99.3 kg)   SpO2 94%   BMI 38.79 kg/m   Weight yesterday-didn't weigh Last visit weight-221 (10/12/16)    Terika Pillard, EMT Paramedic 11/02/2016    ACTION: Home visit completed

## 2016-11-03 DIAGNOSIS — M170 Bilateral primary osteoarthritis of knee: Secondary | ICD-10-CM | POA: Diagnosis not present

## 2016-11-09 ENCOUNTER — Other Ambulatory Visit (HOSPITAL_COMMUNITY): Payer: Self-pay

## 2016-11-09 NOTE — Progress Notes (Signed)
Paramedicine Encounter    Patient ID: Tara Dunn, female    DOB: 01-Oct-1940, 76 y.o.   MRN: [id number]    Patient Care Team: Fanny Bien, MD as PCP - General (Family Medicine)  Patient Active Problem List   Diagnosis Date Noted  . Joint pain 12/09/2015  . ICD (implantable cardioverter-defibrillator) in place 03/26/2015  . Syncope 03/06/2015  . Obesity (BMI 30-39.9) 01/21/2015  . Excessive daytime sleepiness 08/04/2014  . OSA (obstructive sleep apnea) 08/04/2014  . Essential hypertension 06/13/2014  . Chronic systolic CHF (congestive heart failure) ([place]) 05/20/2014  . Congestive heart disease ([place])   . CKD (chronic kidney disease), stage III ([place]) 05/03/2014  . Hx of stroke without residual deficits 05/03/2014    Current Outpatient Prescriptions:  .  allopurinol (ZYLOPRIM) 100 MG tablet, Take 200 mg by mouth daily. , Disp: , Rfl: 0 .  aspirin EC 81 MG tablet, Take 1 tablet (81 mg total) by mouth daily., Disp: 90 tablet, Rfl: 3 .  atorvastatin (LIPITOR) 40 MG tablet, Take 1 tablet (40 mg total) by mouth daily., Disp: 30 tablet, Rfl: 0 .  carvedilol (COREG) 25 MG tablet, Take 1 tablet (25 mg total) by mouth 2 (two) times daily., Disp: 60 tablet, Rfl: 0 .  colchicine 0.6 MG tablet, Take 0.6 mg by mouth daily., Disp: , Rfl:  .  furosemide (LASIX) 20 MG tablet, Take 2 tablets (40 mg total) by mouth every other day., Disp: 30 tablet, Rfl: 0 .  gabapentin (NEURONTIN) 300 MG capsule, Take 300 mg by mouth 3 (three) times daily., Disp: , Rfl:  .  isosorbide-hydrALAZINE (BIDIL) 20-37.5 MG tablet, Take 2 tablets by mouth 3 (three) times daily., Disp: 180 tablet, Rfl: 0 .  omeprazole (PRILOSEC) 40 MG capsule, Take 40 mg by mouth daily., Disp: , Rfl: 0 .  sacubitril-valsartan (ENTRESTO) 97-103 MG, Take 1 tablet by mouth 2 (two) times daily., Disp: 60 tablet, Rfl: 0 .  spironolactone (ALDACTONE) 25 MG tablet, Take 1 tablet (25 mg total) by mouth daily., Disp: 30 tablet, Rfl: 0 .   naproxen sodium (ANAPROX) 220 MG tablet, Take 220 mg by mouth daily as needed (back pain). Reported on 08/26/2015, Disp: , Rfl:  .  traMADol (ULTRAM) 50 MG tablet, Take 1 tablet (50 mg total) by mouth every 12 (twelve) hours as needed (twice daily as needed for pain)., Disp: 60 tablet, Rfl: 0 Allergies  Allergen Reactions  . Shellfish Allergy Hives     Social History   Social History  . Marital status: Widowed    Spouse name: N/A  . Number of children: N/A  . Years of education: N/A   Occupational History  . Not on file.   Social History Main Topics  . Smoking status: Former Smoker    Packs/day: 0.50    Years: 40.00    Types: Cigarettes  . Smokeless tobacco: Never Used     Comment: Quit in 2009.  Marland Kitchen Alcohol use No     Comment: "drank occasionally when I was young; last drink was in the late 1990s"  . Drug use: No  . Sexual activity: No   Other Topics Concern  . Not on file   Social History Narrative   Lives alone in an independent living facility.  Education: 10th grade.     Retired from [employer] work.  Moved here from [place] in 2015.    Physical Exam  Pulmonary/Chest: No respiratory distress. She has no wheezes. She has no rales.  Abdominal: She  exhibits no distension. There is no tenderness. There is no guarding.  Musculoskeletal: She exhibits no edema.  Skin: Skin is warm and dry. She is not diaphoretic.        Future Appointments Date Time Provider [place]  11/17/2016 9:15 AM [facility] DEVICE REMOTES [facility]  12/27/2016 10:00 AM [facility] PA/NP [facility] None  to  ATF [name] sitting in her living room c/o not having energy and "feeling down".  Pt stated that she hasn't been outside Friday.  She has only taken two days worth of medications.  She took her am dose today.  Pt has no reason why she hasn't taken her meds this week. Pt has hx of depression or failure to thrive.  Pt has periods of this for a few days a month.  Pt stated that she has  spoken with her PCP about the same.  This was pt's first time since our [facility] visits that she has neglected her meds for an entire week.  Pt denies sob, dizziness, lightheadedness and chest pain.  She brought some boost over the weekend due to her decrease in appetite.  rx bottles verified and pill box refilled.  Pt has an appointment with her PCP this coming Thursday for knee injections.  I called her PCP and advised her of pt's current mental state.  The secretary or nurse documented the same when I called.  BP 110/72   Pulse 87   Resp 16   Wt 220 lb (99.8 kg)   SpO2 98%   BMI 38.97 kg/m  cbg 145  Weight yesterday-didn't weigh Last visit weight-219    Miyoko Hashimi, EMT Paramedic 11/09/2016    ACTION: Home visit completed

## 2016-11-12 DIAGNOSIS — M170 Bilateral primary osteoarthritis of knee: Secondary | ICD-10-CM | POA: Diagnosis not present

## 2016-11-16 ENCOUNTER — Other Ambulatory Visit (HOSPITAL_COMMUNITY): Payer: Self-pay

## 2016-11-16 NOTE — Progress Notes (Signed)
Paramedicine Encounter    Patient ID: Tara Dunn, female    DOB: 06/15/1940, 76 y.o.   MRN: [id number]    Patient Care Team: Fanny Bien, MD as PCP - General (Family Medicine)  Patient Active Problem List   Diagnosis Date Noted  . Joint pain 12/09/2015  . ICD (implantable cardioverter-defibrillator) in place 03/26/2015  . Syncope 03/06/2015  . Obesity (BMI 30-39.9) 01/21/2015  . Excessive daytime sleepiness 08/04/2014  . OSA (obstructive sleep apnea) 08/04/2014  . Essential hypertension 06/13/2014  . Chronic systolic CHF (congestive heart failure) ([place]) 05/20/2014  . Congestive heart disease ([place])   . CKD (chronic kidney disease), stage III ([place]) 05/03/2014  . Hx of stroke without residual deficits 05/03/2014    Current Outpatient Prescriptions:  .  allopurinol (ZYLOPRIM) 100 MG tablet, Take 200 mg by mouth daily. , Disp: , Rfl: 0 .  aspirin EC 81 MG tablet, Take 1 tablet (81 mg total) by mouth daily., Disp: 90 tablet, Rfl: 3 .  atorvastatin (LIPITOR) 40 MG tablet, Take 1 tablet (40 mg total) by mouth daily., Disp: 30 tablet, Rfl: 0 .  carvedilol (COREG) 25 MG tablet, Take 1 tablet (25 mg total) by mouth 2 (two) times daily., Disp: 60 tablet, Rfl: 0 .  colchicine 0.6 MG tablet, Take 0.6 mg by mouth daily., Disp: , Rfl:  .  furosemide (LASIX) 20 MG tablet, Take 2 tablets (40 mg total) by mouth every other day., Disp: 30 tablet, Rfl: 0 .  gabapentin (NEURONTIN) 300 MG capsule, Take 300 mg by mouth 3 (three) times daily., Disp: , Rfl:  .  isosorbide-hydrALAZINE (BIDIL) 20-37.5 MG tablet, Take 2 tablets by mouth 3 (three) times daily., Disp: 180 tablet, Rfl: 0 .  omeprazole (PRILOSEC) 40 MG capsule, Take 40 mg by mouth daily., Disp: , Rfl: 0 .  sacubitril-valsartan (ENTRESTO) 97-103 MG, Take 1 tablet by mouth 2 (two) times daily., Disp: 60 tablet, Rfl: 0 .  spironolactone (ALDACTONE) 25 MG tablet, Take 1 tablet (25 mg total) by mouth daily., Disp: 30 tablet, Rfl: 0 .   naproxen sodium (ANAPROX) 220 MG tablet, Take 220 mg by mouth daily as needed (back pain). Reported on 08/26/2015, Disp: , Rfl:  .  traMADol (ULTRAM) 50 MG tablet, Take 1 tablet (50 mg total) by mouth every 12 (twelve) hours as needed (twice daily as needed for pain)., Disp: 60 tablet, Rfl: 0 Allergies  Allergen Reactions  . Shellfish Allergy Hives     Social History   Social History  . Marital status: Widowed    Spouse name: N/A  . Number of children: N/A  . Years of education: N/A   Occupational History  . Not on file.   Social History Main Topics  . Smoking status: Former Smoker    Packs/day: 0.50    Years: 40.00    Types: Cigarettes  . Smokeless tobacco: Never Used     Comment: Quit in 2009.  Marland Kitchen Alcohol use No     Comment: "drank occasionally when I was young; last drink was in the late 1990s"  . Drug use: No  . Sexual activity: No   Other Topics Concern  . Not on file   Social History Narrative   Lives alone in an independent living facility.  Education: 10th grade.     Retired from [employer] work.  Moved here from [place] in 2015.    Physical Exam  Pulmonary/Chest: No respiratory distress. She has no wheezes. She has no rales.  Abdominal: She  exhibits no distension. There is no tenderness. There is no guarding.  Musculoskeletal: She exhibits no edema.  Skin: Skin is warm and dry. She is not diaphoretic.        Future Appointments Date Time Provider [place]  11/17/2016 9:15 AM [facility] DEVICE REMOTES [facility]  12/27/2016 10:00 AM [facility] PA/NP [facility] None    ATF [name] x4 sitting on the couch c/o being very tired.  She stated that its hard for her to get a good nights sleep.  She wakes up around 4am and can't go back to sleep.  She stated that her knees feel a little better since the injections.  She plans on returning [facility] to exercise.  Pt denies sob, lightheadedness and chest pain.  Pt stated that she did eat barbque ribs  yesterday.  She has missed several meds doses in this week mostly the afternoon dose of bidil and gabapentin.  rx bottles verified and pill box refilled.   BP 112/60   Pulse 72   Resp 16   Wt 221 lb 6.4 oz (100.4 kg)   SpO2 98%   BMI 39.22 kg/m   Weight yesterday-221.2 Last visit weight-220    Devonna Oboyle, EMT Paramedic 11/16/2016    ACTION: Home visit completed

## 2016-11-17 ENCOUNTER — Ambulatory Visit (INDEPENDENT_AMBULATORY_CARE_PROVIDER_SITE_OTHER): Payer: PPO | Admitting: *Deleted

## 2016-11-17 ENCOUNTER — Telehealth: Payer: Self-pay | Admitting: Licensed Clinical Social Worker

## 2016-11-17 DIAGNOSIS — I5022 Chronic systolic (congestive) heart failure: Secondary | ICD-10-CM | POA: Diagnosis not present

## 2016-11-17 DIAGNOSIS — Z95810 Presence of automatic (implantable) cardiac defibrillator: Secondary | ICD-10-CM

## 2016-11-17 NOTE — Progress Notes (Signed)
Remote ICD transmission.   

## 2016-11-17 NOTE — Telephone Encounter (Signed)
Patient was discussed in weekly paramedicine care coordination meeting and identified by Paramedic as needing supportive call. CSW contacted patient who states "I am feeling fair". Patient states sometimes she feels really tired  And it gets her down. She admits to good days and bad and her mood typically correlates with her health needs. CSW provided supportive counseling and encouragement and will follow up with patient on next clinic visit. Patient grateful for call and support. CSW will continue to follow through the [employer] for care coordination and any other needs as they arise. [name], [place], [place]

## 2016-11-18 LAB — CUP PACEART REMOTE DEVICE CHECK
Battery Remaining Longevity: 129 mo
Battery Voltage: 3.03 V
Brady Statistic RV Percent Paced: 0.03 %
Date Time Interrogation Session: 20181010041704
HIGH POWER IMPEDANCE MEASURED VALUE: 78 Ohm
Implantable Lead Implant Date: 20170215
Implantable Lead Location: 753860
Lead Channel Impedance Value: 399 Ohm
Lead Channel Impedance Value: 456 Ohm
Lead Channel Pacing Threshold Amplitude: 0.625 V
Lead Channel Pacing Threshold Pulse Width: 0.4 ms
Lead Channel Sensing Intrinsic Amplitude: 19 mV
Lead Channel Sensing Intrinsic Amplitude: 19 mV
Lead Channel Setting Sensing Sensitivity: 0.3 mV
MDC IDC PG IMPLANT DT: 20170215
MDC IDC SET LEADCHNL RV PACING AMPLITUDE: 2.5 V
MDC IDC SET LEADCHNL RV PACING PULSEWIDTH: 0.4 ms

## 2016-11-19 ENCOUNTER — Encounter: Payer: Self-pay | Admitting: Cardiology

## 2016-11-23 DIAGNOSIS — R109 Unspecified abdominal pain: Secondary | ICD-10-CM | POA: Diagnosis not present

## 2016-11-23 DIAGNOSIS — A059 Bacterial foodborne intoxication, unspecified: Secondary | ICD-10-CM | POA: Diagnosis not present

## 2016-11-23 DIAGNOSIS — R100 Acute abdomen: Secondary | ICD-10-CM | POA: Diagnosis not present

## 2016-11-25 ENCOUNTER — Other Ambulatory Visit (HOSPITAL_COMMUNITY): Payer: Self-pay

## 2016-11-25 DIAGNOSIS — I10 Essential (primary) hypertension: Secondary | ICD-10-CM | POA: Diagnosis not present

## 2016-11-25 DIAGNOSIS — A059 Bacterial foodborne intoxication, unspecified: Secondary | ICD-10-CM | POA: Diagnosis not present

## 2016-11-25 DIAGNOSIS — R109 Unspecified abdominal pain: Secondary | ICD-10-CM | POA: Diagnosis not present

## 2016-11-26 DIAGNOSIS — E782 Mixed hyperlipidemia: Secondary | ICD-10-CM | POA: Diagnosis not present

## 2016-11-26 DIAGNOSIS — I10 Essential (primary) hypertension: Secondary | ICD-10-CM | POA: Diagnosis not present

## 2016-11-26 DIAGNOSIS — N19 Unspecified kidney failure: Secondary | ICD-10-CM | POA: Diagnosis not present

## 2016-11-26 NOTE — Progress Notes (Signed)
I went to pt's apartment Tuesday for our [facility] visit but there was no answer at her door.  She called me today and stated that she was at the doctors office during our regular CHP time.  She has had severe abdominal pain and vomiting since Monday.  She stated that she has to return tomorrow for lab work and she has a f/u appointment scheduled for Sat.  Pt rescheduled our weekly appointment for next week.  She stated that her pill box is still full dur to not being able to eat or keep her meds down.  I scheduled pt for an appointment on Tuesday.

## 2016-11-29 DIAGNOSIS — E782 Mixed hyperlipidemia: Secondary | ICD-10-CM | POA: Diagnosis not present

## 2016-11-29 DIAGNOSIS — I509 Heart failure, unspecified: Secondary | ICD-10-CM | POA: Diagnosis not present

## 2016-11-29 DIAGNOSIS — K5792 Diverticulitis of intestine, part unspecified, without perforation or abscess without bleeding: Secondary | ICD-10-CM | POA: Diagnosis not present

## 2016-11-29 DIAGNOSIS — Z0000 Encounter for general adult medical examination without abnormal findings: Secondary | ICD-10-CM | POA: Diagnosis not present

## 2016-11-29 DIAGNOSIS — Z23 Encounter for immunization: Secondary | ICD-10-CM | POA: Diagnosis not present

## 2016-11-29 DIAGNOSIS — Z6837 Body mass index (BMI) 37.0-37.9, adult: Secondary | ICD-10-CM | POA: Diagnosis not present

## 2016-11-29 DIAGNOSIS — I10 Essential (primary) hypertension: Secondary | ICD-10-CM | POA: Diagnosis not present

## 2016-12-02 ENCOUNTER — Telehealth (HOSPITAL_COMMUNITY): Payer: Self-pay

## 2016-12-02 NOTE — Telephone Encounter (Signed)
I spoke with pt on Tuesday and she wanted to reschedule our appointment for today.  I called pt to confirm that she's home and she didn't answer.  Pt always answers her phone when shes home prior to out visits.  I will f/u with pt tomorrow to reschedule.

## 2016-12-03 ENCOUNTER — Other Ambulatory Visit (HOSPITAL_COMMUNITY): Payer: Self-pay

## 2016-12-03 NOTE — Progress Notes (Signed)
Paramedicine Encounter    Patient ID: Tara Dunn, female    DOB: [birth year], 76 y.o.   MRN: [id number]    Patient Care Team: Fanny Bien, MD as PCP - General (Family Medicine)  Patient Active Problem List   Diagnosis Date Noted  . Joint pain 12/09/2015  . ICD (implantable cardioverter-defibrillator) in place 03/26/2015  . Syncope 03/06/2015  . Obesity (BMI 30-39.9) 01/21/2015  . Excessive daytime sleepiness 08/04/2014  . OSA (obstructive sleep apnea) 08/04/2014  . Essential hypertension 06/13/2014  . Chronic systolic CHF (congestive heart failure) ([place]) 05/20/2014  . Congestive heart disease ([place])   . CKD (chronic kidney disease), stage III ([place]) 05/03/2014  . Hx of stroke without residual deficits 05/03/2014    Current Outpatient Prescriptions:  .  allopurinol (ZYLOPRIM) 100 MG tablet, Take 200 mg by mouth daily. , Disp: , Rfl: 0 .  aspirin EC 81 MG tablet, Take 1 tablet (81 mg total) by mouth daily., Disp: 90 tablet, Rfl: 3 .  atorvastatin (LIPITOR) 40 MG tablet, Take 1 tablet (40 mg total) by mouth daily., Disp: 30 tablet, Rfl: 0 .  carvedilol (COREG) 25 MG tablet, Take 1 tablet (25 mg total) by mouth 2 (two) times daily., Disp: 60 tablet, Rfl: 0 .  colchicine 0.6 MG tablet, Take 0.6 mg by mouth daily., Disp: , Rfl:  .  furosemide (LASIX) 20 MG tablet, Take 2 tablets (40 mg total) by mouth every other day., Disp: 30 tablet, Rfl: 0 .  gabapentin (NEURONTIN) 300 MG capsule, Take 300 mg by mouth 3 (three) times daily., Disp: , Rfl:  .  isosorbide-hydrALAZINE (BIDIL) 20-37.5 MG tablet, Take 2 tablets by mouth 3 (three) times daily., Disp: 180 tablet, Rfl: 0 .  omeprazole (PRILOSEC) 40 MG capsule, Take 40 mg by mouth daily., Disp: , Rfl: 0 .  sacubitril-valsartan (ENTRESTO) 97-103 MG, Take 1 tablet by mouth 2 (two) times daily., Disp: 60 tablet, Rfl: 0 .  spironolactone (ALDACTONE) 25 MG tablet, Take 1 tablet (25 mg total) by mouth daily., Disp: 30 tablet, Rfl: 0 .   naproxen sodium (ANAPROX) 220 MG tablet, Take 220 mg by mouth daily as needed (back pain). Reported on 08/26/2015, Disp: , Rfl:  .  traMADol (ULTRAM) 50 MG tablet, Take 1 tablet (50 mg total) by mouth every 12 (twelve) hours as needed (twice daily as needed for pain)., Disp: 60 tablet, Rfl: 0 Allergies  Allergen Reactions  . Shellfish Allergy Hives     Social History   Social History  . Marital status: Widowed    Spouse name: N/A  . Number of children: N/A  . Years of education: N/A   Occupational History  . Not on file.   Social History Main Topics  . Smoking status: Former Smoker    Packs/day: 0.50    Years: 40.00    Types: Cigarettes  . Smokeless tobacco: Never Used     Comment: Quit in 2009.  Marland Kitchen Alcohol use No     Comment: "drank occasionally when I was young; last drink was in the late 1990s"  . Drug use: No  . Sexual activity: No   Other Topics Concern  . Not on file   Social History Narrative   Lives alone in an independent living facility.  Education: 10th grade.     Retired from [employer] work.  Moved here from [place] in 2015.    Physical Exam  Pulmonary/Chest: No respiratory distress. She has no wheezes. She has no rales.  Abdominal: She  exhibits no distension. There is no tenderness. There is no guarding.  Musculoskeletal: She exhibits no edema.  Skin: Skin is warm and dry. She is not diaphoretic.        Future Appointments Date Time Provider [place]  12/27/2016 10:00 AM [facility] PA/NP [facility] None  02/16/2017 8:50 AM [facility] DEVICE REMOTES [facility]    ATF [name] x4 sitting in her livingroom with no complaints. She stated that she feels a lot better, she had severe stomach pain and nausea last week. Pt stated that her pcp told her that may have eaten "bad food". Pt stated that she had meat left over in her refridgerator after the lights were out for a couple of days.  Pt was unable to keep any food down and she had a lot of pain.  Pt  has no complaints today and she has taken all of her meds besides the afternoon meds.  rx bottles verified and pill box refilled.   BP (!) 148/96   Pulse 76   Resp 16   Wt 214 lb 12.8 oz (97.4 kg)   SpO2 98%   BMI 38.05 kg/m   Weight yesterday-didn't weigh Last visit weight-221    Grady Mohabir, EMT Paramedic 12/03/2016    ACTION: Home visit completed Next visit planned for next week

## 2016-12-06 DIAGNOSIS — R109 Unspecified abdominal pain: Secondary | ICD-10-CM | POA: Diagnosis not present

## 2016-12-09 ENCOUNTER — Other Ambulatory Visit (HOSPITAL_COMMUNITY): Payer: Self-pay

## 2016-12-09 NOTE — Progress Notes (Signed)
Paramedicine Encounter    Patient ID: Tara Dunn, female    DOB: Oct 01, 1940, 76 y.o.   MRN: [id number]    Patient Care Team: Fanny Bien, MD as PCP - General (Family Medicine)  Patient Active Problem List   Diagnosis Date Noted  . Joint pain 12/09/2015  . ICD (implantable cardioverter-defibrillator) in place 03/26/2015  . Syncope 03/06/2015  . Obesity (BMI 30-39.9) 01/21/2015  . Excessive daytime sleepiness 08/04/2014  . OSA (obstructive sleep apnea) 08/04/2014  . Essential hypertension 06/13/2014  . Chronic systolic CHF (congestive heart failure) ([place]) 05/20/2014  . Congestive heart disease ([place])   . CKD (chronic kidney disease), stage III ([place]) 05/03/2014  . Hx of stroke without residual deficits 05/03/2014    Current Outpatient Prescriptions:  .  allopurinol (ZYLOPRIM) 100 MG tablet, Take 200 mg by mouth daily. , Disp: , Rfl: 0 .  aspirin EC 81 MG tablet, Take 1 tablet (81 mg total) by mouth daily., Disp: 90 tablet, Rfl: 3 .  atorvastatin (LIPITOR) 40 MG tablet, Take 1 tablet (40 mg total) by mouth daily., Disp: 30 tablet, Rfl: 0 .  carvedilol (COREG) 25 MG tablet, Take 1 tablet (25 mg total) by mouth 2 (two) times daily., Disp: 60 tablet, Rfl: 0 .  colchicine 0.6 MG tablet, Take 0.6 mg by mouth daily., Disp: , Rfl:  .  furosemide (LASIX) 20 MG tablet, Take 2 tablets (40 mg total) by mouth every other day., Disp: 30 tablet, Rfl: 0 .  gabapentin (NEURONTIN) 300 MG capsule, Take 300 mg by mouth 3 (three) times daily., Disp: , Rfl:  .  isosorbide-hydrALAZINE (BIDIL) 20-37.5 MG tablet, Take 2 tablets by mouth 3 (three) times daily., Disp: 180 tablet, Rfl: 0 .  omeprazole (PRILOSEC) 40 MG capsule, Take 40 mg by mouth daily., Disp: , Rfl: 0 .  sacubitril-valsartan (ENTRESTO) 97-103 MG, Take 1 tablet by mouth 2 (two) times daily., Disp: 60 tablet, Rfl: 0 .  spironolactone (ALDACTONE) 25 MG tablet, Take 1 tablet (25 mg total) by mouth daily., Disp: 30 tablet, Rfl: 0 .   naproxen sodium (ANAPROX) 220 MG tablet, Take 220 mg by mouth daily as needed (back pain). Reported on 08/26/2015, Disp: , Rfl:  .  traMADol (ULTRAM) 50 MG tablet, Take 1 tablet (50 mg total) by mouth every 12 (twelve) hours as needed (twice daily as needed for pain)., Disp: 60 tablet, Rfl: 0 Allergies  Allergen Reactions  . Shellfish Allergy Hives     Social History   Social History  . Marital status: Widowed    Spouse name: N/A  . Number of children: N/A  . Years of education: N/A   Occupational History  . Not on file.   Social History Main Topics  . Smoking status: Former Smoker    Packs/day: 0.50    Years: 40.00    Types: Cigarettes  . Smokeless tobacco: Never Used     Comment: Quit in 2009.  Marland Kitchen Alcohol use No     Comment: "drank occasionally when I was young; last drink was in the late 1990s"  . Drug use: No  . Sexual activity: No   Other Topics Concern  . Not on file   Social History Narrative   Lives alone in an independent living facility.  Education: 10th grade.     Retired from [employer] work.  Moved here from [place] in 2015.    Physical Exam  Pulmonary/Chest: No respiratory distress. She has no wheezes.  Abdominal: She exhibits no distension. There  is no tenderness.  Musculoskeletal: She exhibits no edema.  Skin: Skin is warm and dry. She is not diaphoretic.        Future Appointments Date Time Provider [place]  12/27/2016 10:00 AM [facility] PA/NP [facility] None  02/16/2017 8:50 AM [facility] DEVICE REMOTES [facility]    ATF [name] x4 sitting on the couch with no complaints. Pt stated that she fell the coming while coming out of the bathroom two days ago.  She denies pain at this time; her daughter took her walker to get it fixed.  Pt stated that her daughter talked to her about moving in with her due to her recent health issues.  She cant remember what happen that caused her to fall. Pt denies sob, dizziness and chest pain.  Pt has taken  most of her meds this week, but she contiunes to miss her afternoon doses.  Pt also has two bottles on her table with multiple pills in them.  I asked pt if she has been putting her pills inside of the pill bottles instead of taking them and she stated no.  Pt vitals noted.  rx bottles verified and pill box refilled.   BP 100/68   Pulse 67   Resp 16   Wt 220 lb (99.8 kg)   SpO2 98%   BMI 38.97 kg/m   Weight yesterday-didn't weigh Last visit weight-214    Tara Dunn, EMT Paramedic 12/09/2016    ACTION: Home visit completed

## 2016-12-17 ENCOUNTER — Other Ambulatory Visit (HOSPITAL_COMMUNITY): Payer: Self-pay

## 2016-12-17 NOTE — Progress Notes (Signed)
Paramedicine Encounter    Patient ID: Tara Dunn, female    DOB: 1941/01/27, 76 y.o.   MRN: [id number]    Patient Care Team: Fanny Bien, MD as PCP - General (Family Medicine)  Patient Active Problem List   Diagnosis Date Noted  . Joint pain 12/09/2015  . ICD (implantable cardioverter-defibrillator) in place 03/26/2015  . Syncope 03/06/2015  . Obesity (BMI 30-39.9) 01/21/2015  . Excessive daytime sleepiness 08/04/2014  . OSA (obstructive sleep apnea) 08/04/2014  . Essential hypertension 06/13/2014  . Chronic systolic CHF (congestive heart failure) ([place]) 05/20/2014  . Congestive heart disease ([place])   . CKD (chronic kidney disease), stage III ([place]) 05/03/2014  . Hx of stroke without residual deficits 05/03/2014    Current Outpatient Medications:  .  allopurinol (ZYLOPRIM) 100 MG tablet, Take 200 mg by mouth daily. , Disp: , Rfl: 0 .  aspirin EC 81 MG tablet, Take 1 tablet (81 mg total) by mouth daily., Disp: 90 tablet, Rfl: 3 .  atorvastatin (LIPITOR) 40 MG tablet, Take 1 tablet (40 mg total) by mouth daily., Disp: 30 tablet, Rfl: 0 .  carvedilol (COREG) 25 MG tablet, Take 1 tablet (25 mg total) by mouth 2 (two) times daily., Disp: 60 tablet, Rfl: 0 .  colchicine 0.6 MG tablet, Take 0.6 mg by mouth daily., Disp: , Rfl:  .  furosemide (LASIX) 20 MG tablet, Take 2 tablets (40 mg total) by mouth every other day., Disp: 30 tablet, Rfl: 0 .  gabapentin (NEURONTIN) 300 MG capsule, Take 300 mg by mouth 3 (three) times daily., Disp: , Rfl:  .  isosorbide-hydrALAZINE (BIDIL) 20-37.5 MG tablet, Take 2 tablets by mouth 3 (three) times daily., Disp: 180 tablet, Rfl: 0 .  omeprazole (PRILOSEC) 40 MG capsule, Take 40 mg by mouth daily., Disp: , Rfl: 0 .  sacubitril-valsartan (ENTRESTO) 97-103 MG, Take 1 tablet by mouth 2 (two) times daily., Disp: 60 tablet, Rfl: 0 .  spironolactone (ALDACTONE) 25 MG tablet, Take 1 tablet (25 mg total) by mouth daily., Disp: 30 tablet, Rfl: 0 .   naproxen sodium (ANAPROX) 220 MG tablet, Take 220 mg by mouth daily as needed (back pain). Reported on 08/26/2015, Disp: , Rfl:  .  traMADol (ULTRAM) 50 MG tablet, Take 1 tablet (50 mg total) by mouth every 12 (twelve) hours as needed (twice daily as needed for pain)., Disp: 60 tablet, Rfl: 0 Allergies  Allergen Reactions  . Shellfish Allergy Hives     Social History   Socioeconomic History  . Marital status: Widowed    Spouse name: Not on file  . Number of children: Not on file  . Years of education: Not on file  . Highest education level: Not on file  Social Needs  . Financial resource strain: Not on file  . Food insecurity - worry: Not on file  . Food insecurity - inability: Not on file  . Transportation needs - medical: Not on file  . Transportation needs - non-medical: Not on file  Occupational History  . Not on file  Tobacco Use  . Smoking status: Former Smoker    Packs/day: 0.50    Years: 40.00    Pack years: 20.00    Types: Cigarettes  . Smokeless tobacco: Never Used  . Tobacco comment: Quit in 2009.  Substance and Sexual Activity  . Alcohol use: No    Alcohol/week: 0.0 oz    Comment: "drank occasionally when I was young; last drink was in the late 1990s"  .  Drug use: No  . Sexual activity: No  Other Topics Concern  . Not on file  Social History Narrative   Lives alone in an independent living facility.  Education: 10th grade.     Retired from [employer] work.  Moved here from [place] in 2015.    Physical Exam  Pulmonary/Chest: No respiratory distress.  Abdominal: She exhibits no distension. There is no tenderness. There is no guarding.  Musculoskeletal: She exhibits no edema.  Skin: Skin is warm and dry. She is not diaphoretic.        Future Appointments  Date Time Provider [place]  12/27/2016 10:00 AM [facility] PA/NP [facility] None  02/16/2017  8:50 AM [facility] DEVICE REMOTES [facility]    ATF [name] x4 sitting on the couch c/o knee  pain. Pt stated that the shots that she received in her knees months ago didn't work and she still have pain.  She stated that she only experience pain when she walks or move her legs.  Pt hasn't taken any pain meds for managing her pain.  She has taken all of her meds without missing any. She denies sob, dizziness, and chest pain.  rx bottles verified and pill box refilled.  Pt stated that she will take some tylenol after her daughter comes and she doesn't have to leave the apartment.  BP 102/64   Pulse (!) 104   Resp 16   Wt 221 lb 9.6 oz (100.5 kg)   SpO2 98%   BMI 39.25 kg/m   Weight yesterday-cant remember Last visit weight-220    [name], EMT Paramedic 12/17/2016    ACTION: Home visit completed Next visit planned for next friday

## 2016-12-24 ENCOUNTER — Encounter (HOSPITAL_COMMUNITY): Payer: Self-pay

## 2016-12-24 ENCOUNTER — Other Ambulatory Visit (HOSPITAL_COMMUNITY): Payer: Self-pay

## 2016-12-24 NOTE — Progress Notes (Signed)
Paramedicine Encounter    Patient ID: Tara Dunn, female    DOB: 04/10/1940, 76 y.o.   MRN: [id number]    Patient Care Team: Fanny Bien, MD as PCP - General (Family Medicine)  Patient Active Problem List   Diagnosis Date Noted  . Joint pain 12/09/2015  . ICD (implantable cardioverter-defibrillator) in place 03/26/2015  . Syncope 03/06/2015  . Obesity (BMI 30-39.9) 01/21/2015  . Excessive daytime sleepiness 08/04/2014  . OSA (obstructive sleep apnea) 08/04/2014  . Essential hypertension 06/13/2014  . Chronic systolic CHF (congestive heart failure) ([place]) 05/20/2014  . Congestive heart disease ([place])   . CKD (chronic kidney disease), stage III ([place]) 05/03/2014  . Hx of stroke without residual deficits 05/03/2014    Current Outpatient Medications:  .  allopurinol (ZYLOPRIM) 100 MG tablet, Take 200 mg by mouth daily. , Disp: , Rfl: 0 .  aspirin EC 81 MG tablet, Take 1 tablet (81 mg total) by mouth daily., Disp: 90 tablet, Rfl: 3 .  atorvastatin (LIPITOR) 40 MG tablet, Take 1 tablet (40 mg total) by mouth daily., Disp: 30 tablet, Rfl: 0 .  carvedilol (COREG) 25 MG tablet, Take 1 tablet (25 mg total) by mouth 2 (two) times daily., Disp: 60 tablet, Rfl: 0 .  colchicine 0.6 MG tablet, Take 0.6 mg by mouth daily., Disp: , Rfl:  .  furosemide (LASIX) 20 MG tablet, Take 2 tablets (40 mg total) by mouth every other day., Disp: 30 tablet, Rfl: 0 .  gabapentin (NEURONTIN) 300 MG capsule, Take 300 mg by mouth 3 (three) times daily., Disp: , Rfl:  .  isosorbide-hydrALAZINE (BIDIL) 20-37.5 MG tablet, Take 2 tablets by mouth 3 (three) times daily., Disp: 180 tablet, Rfl: 0 .  omeprazole (PRILOSEC) 40 MG capsule, Take 40 mg by mouth daily., Disp: , Rfl: 0 .  sacubitril-valsartan (ENTRESTO) 97-103 MG, Take 1 tablet by mouth 2 (two) times daily., Disp: 60 tablet, Rfl: 0 .  spironolactone (ALDACTONE) 25 MG tablet, Take 1 tablet (25 mg total) by mouth daily., Disp: 30 tablet, Rfl: 0 .   naproxen sodium (ANAPROX) 220 MG tablet, Take 220 mg by mouth daily as needed (back pain). Reported on 08/26/2015, Disp: , Rfl:  .  traMADol (ULTRAM) 50 MG tablet, Take 1 tablet (50 mg total) by mouth every 12 (twelve) hours as needed (twice daily as needed for pain)., Disp: 60 tablet, Rfl: 0 Allergies  Allergen Reactions  . Shellfish Allergy Hives     Social History   Socioeconomic History  . Marital status: Widowed    Spouse name: Not on file  . Number of children: Not on file  . Years of education: Not on file  . Highest education level: Not on file  Social Needs  . Financial resource strain: Not on file  . Food insecurity - worry: Not on file  . Food insecurity - inability: Not on file  . Transportation needs - medical: Not on file  . Transportation needs - non-medical: Not on file  Occupational History  . Not on file  Tobacco Use  . Smoking status: Former Smoker    Packs/day: 0.50    Years: 40.00    Pack years: 20.00    Types: Cigarettes  . Smokeless tobacco: Never Used  . Tobacco comment: Quit in 2009.  Substance and Sexual Activity  . Alcohol use: No    Alcohol/week: 0.0 oz    Comment: "drank occasionally when I was young; last drink was in the late 1990s"  .  Drug use: No  . Sexual activity: No  Other Topics Concern  . Not on file  Social History Narrative   Lives alone in an independent living facility.  Education: 10th grade.     Retired from [employer] work.  Moved here from [place] in 2015.    Physical Exam  Pulmonary/Chest: No respiratory distress. She has no wheezes. She has no rales.  Abdominal: She exhibits no distension. There is no tenderness.  Musculoskeletal: She exhibits no edema.  Skin: Skin is warm and dry. She is not diaphoretic.        Future Appointments  Date Time Provider [place]  12/27/2016 10:00 AM [facility] PA/NP [facility] None  02/16/2017  8:50 AM [facility] DEVICE REMOTES [facility]    ATF [name] x4 watching tv in  her living room. Pt is complaining of right toe pain after "hitting her foot on the side of the bed Monday". Pt stated that she hasn't taken any pain meds such as tylenol "because it doesn't work".  Pt had pain due to gout during the week but she doesn't have gout pain today.  Pt has taken all of her meds besides the sat dose from last week.  Pt seems to be in better spirits than she was a couple of visits ago.  Pt denies dizziness, sob and chest pain.  Pt is still watching what she eats.  rx bottles verified and pill box refilled. No rx refills needed due to her having meds left over from her stay in [place].   Pt would like to become more active but she's afraid of going back [facility] the [facility] due to her last near syncope episode in the pool.    BP (!) 132/0 (BP Location: Right Wrist, Patient Position: Sitting, Cuff Size: Normal)   Pulse 73   Resp 16   Wt 219 lb 6.4 oz (99.5 kg)   SpO2 97%   BMI 38.86 kg/m   Weight yesterday-cant remember Last visit weight-221    Tara Dunn, EMT Paramedic 12/24/2016    ACTION: Home visit completed

## 2016-12-27 ENCOUNTER — Telehealth (HOSPITAL_COMMUNITY): Payer: Self-pay | Admitting: Cardiology

## 2016-12-27 ENCOUNTER — Other Ambulatory Visit (HOSPITAL_COMMUNITY): Payer: Self-pay

## 2016-12-27 ENCOUNTER — Ambulatory Visit (HOSPITAL_COMMUNITY)
Admission: RE | Admit: 2016-12-27 | Discharge: 2016-12-27 | Disposition: A | Discharge status: Home / Self Care | Payer: PPO | Source: Ambulatory Visit | Attending: Cardiology | Admitting: Cardiology

## 2016-12-27 ENCOUNTER — Encounter (HOSPITAL_COMMUNITY): Payer: Self-pay

## 2016-12-27 VITALS — BP 132/78 | HR 78 | Wt 223.0 lb

## 2016-12-27 DIAGNOSIS — Z9012 Acquired absence of left breast and nipple: Secondary | ICD-10-CM | POA: Diagnosis not present

## 2016-12-27 DIAGNOSIS — Z7982 Long term (current) use of aspirin: Secondary | ICD-10-CM | POA: Insufficient documentation

## 2016-12-27 DIAGNOSIS — I5022 Chronic systolic (congestive) heart failure: Secondary | ICD-10-CM

## 2016-12-27 DIAGNOSIS — N183 Chronic kidney disease, stage 3 unspecified: Secondary | ICD-10-CM

## 2016-12-27 DIAGNOSIS — I429 Cardiomyopathy, unspecified: Secondary | ICD-10-CM | POA: Insufficient documentation

## 2016-12-27 DIAGNOSIS — Z87891 Personal history of nicotine dependence: Secondary | ICD-10-CM | POA: Insufficient documentation

## 2016-12-27 DIAGNOSIS — E669 Obesity, unspecified: Secondary | ICD-10-CM | POA: Diagnosis not present

## 2016-12-27 DIAGNOSIS — Z853 Personal history of malignant neoplasm of breast: Secondary | ICD-10-CM | POA: Diagnosis not present

## 2016-12-27 DIAGNOSIS — Z8673 Personal history of transient ischemic attack (TIA), and cerebral infarction without residual deficits: Secondary | ICD-10-CM | POA: Diagnosis not present

## 2016-12-27 DIAGNOSIS — G4733 Obstructive sleep apnea (adult) (pediatric): Secondary | ICD-10-CM | POA: Diagnosis not present

## 2016-12-27 DIAGNOSIS — I110 Hypertensive heart disease with heart failure: Secondary | ICD-10-CM

## 2016-12-27 DIAGNOSIS — I2510 Atherosclerotic heart disease of native coronary artery without angina pectoris: Secondary | ICD-10-CM | POA: Insufficient documentation

## 2016-12-27 DIAGNOSIS — M109 Gout, unspecified: Secondary | ICD-10-CM | POA: Diagnosis not present

## 2016-12-27 DIAGNOSIS — Z79899 Other long term (current) drug therapy: Secondary | ICD-10-CM | POA: Diagnosis not present

## 2016-12-27 DIAGNOSIS — Z79891 Long term (current) use of opiate analgesic: Secondary | ICD-10-CM | POA: Diagnosis not present

## 2016-12-27 DIAGNOSIS — I130 Hypertensive heart and chronic kidney disease with heart failure and stage 1 through stage 4 chronic kidney disease, or unspecified chronic kidney disease: Secondary | ICD-10-CM | POA: Diagnosis not present

## 2016-12-27 DIAGNOSIS — Z95810 Presence of automatic (implantable) cardiac defibrillator: Secondary | ICD-10-CM

## 2016-12-27 LAB — BASIC METABOLIC PANEL
Anion gap: 9 (ref 5–15)
BUN: 34 mg/dL — AB (ref 6–20)
CALCIUM: 9.6 mg/dL (ref 8.9–10.3)
CHLORIDE: 108 mmol/L (ref 101–111)
CO2: 22 mmol/L (ref 22–32)
Creatinine, Ser: 2.22 mg/dL — ABNORMAL HIGH (ref 0.44–1.00)
GFR calc Af Amer: 24 mL/min — ABNORMAL LOW (ref >60)
GFR calc non Af Amer: 20 mL/min — ABNORMAL LOW (ref >60)
GLUCOSE: 102 mg/dL — AB (ref 65–99)
Potassium: 4 mmol/L (ref 3.5–5.1)
Sodium: 139 mmol/L (ref 135–145)

## 2016-12-27 NOTE — Patient Instructions (Addendum)
Routine lab work today. Will notify you of abnormal results, otherwise no news is good news!  INCREASE Lasix to 60 mg (3 tabs) once daily for TWO days, then reduce back to normal daily dose of 40 mg (2 tabs) every other day.  Follow up 3 months.  ______________________________________________________ Tara Dunn Code: 9001  Take all medication as prescribed the day of your appointment. Bring all medications with you to your appointment.  Do the following things EVERYDAY: 1) Weigh yourself in the morning before breakfast. Write it down and keep it in a log. 2) Take your medicines as prescribed 3) Eat low salt foods-Limit salt (sodium) to 2000 mg per day.  4) Stay as active as you can everyday 5) Limit all fluids for the day to less than 2 liters

## 2016-12-27 NOTE — Progress Notes (Signed)
Paramedicine CSW Initial Assessment  Tara Dunn is enrolled in the [employer] Program through [facility].  The patient presents today in association with an [facility] Appointment.  Patient seen today by CSW for follow up/assistance on Health problems and/or Transportation.   Social Determinants impacting successful heart failure regimen:  Housing: patient lives alone Food: Do you have enough food? Yes  Do you know and understand healthy eating and how that affects your heart failure diagnosis? Yes  Do you follow a low salt diet?  Yes  Utilities: Do you have gas and/or electricity on in your home? Yes  Income: What is your source of income? Social security Insurance: [employer] Transportation: Do you have transportation to your medical appointments?Yes  If yes, how? Patient normally has her daughter or [facility] although she reports today she overslept and requested cab ride from HF clinic.    Daily Health Needs: Do you have a scale and weigh each day?  Yes  Are you able to adhere to your medication regimen? Yes  Do you ever take medications differently than prescribed? No  Do you know the zones of Heart Failure?  Yes discussed at length and provided zone sheet for reinforcement. Do you know how to contact the HF Clinic appropriately with worsening symptoms or weight increases?  Yes   Do you have an Advanced Directive?  No  If not, would you like to complete? Patient and CSW discussed advanced directives and provided patient with a form to review with her children. Patient states she would like to complete on next clinic visit.  Do you have any identified obstacles / challenges for adherence to current treatment plan?No    Patient appears to be lonely as she described her recent decline and she is home alone most days. [facility] and [facility].   CSW assisted  patient with Supportive Counseling with the goal to Increase healthy adjustment to current life circumstances  [facility] will continue to coordinate and monitor patient's treatment plan. CSW continues to be available for identified needs. Tara Dunn, [place], [place]

## 2016-12-27 NOTE — Progress Notes (Signed)
Paramedicine Encounter   Patient ID: Tara Dunn , female,   DOB: [birth year],75 y.o.,  MRN: [id number]   Met patient in clinic today with provider Amy.  Pt has taken her morning meds and reports no changes this week.  Pt hasn't been using her cpap machine in a month, which Amy advised her to start back using regularly.  Pt was also advised to watch what she eats during the holidays due to her currently retaining fluid.  Pt was advised to take an extra 41m of furosemide today and tomorrow then resume taking 40 daily.  Pt's pill box will be filled accordingly later at her house because she left her pill box at home.    **rx change: Extra 236mfurosemide for two days Back to 4080maily Time spent with patient 30 mins  Andy Moye, EMT-Paramedic 12/27/2016   ACTION: Home visit completed

## 2016-12-27 NOTE — Addendum Note (Signed)
Encounter addended by: Louann Liv, LCSW on: 12/27/2016 12:01 PM  Actions taken: Sign clinical note

## 2016-12-27 NOTE — Progress Notes (Signed)
Patient ID: Tara Dunn, female   DOB: 13-Mar-1940, 76 y.o.   MRN: [id number]     Advanced Heart Failure Clinic Note   PCP: Dr. Ernie Hew HF: Dr. Aundra Dubin   76 yo with long history of HTN, CVAs, CKD, and systolic CHF.  She moved to [place] from [place] in 2015.   In 3/16, she was admitted a hypertensive emergency and acute CHF.  CTA chest showed no PE.  Echo showed EF 25-30% with moderate LVH.  She was diuresed and had a cardiac catheterization given extensive coronary calcification seen on CTA chest.  Cath showed diffuse moderate CAD, worst was 50-70% mRCA stenosis.   Today she returns for HF follow up. Overall feeling fine. Limited by knee pain. Denies SOB/PND/Orthopnea. Not using CPAP.  Appetite ok. No fever or chills. Taking all medications  Optivol: Fluid index trending up. Activity less than 1 hours.    Labs (3/16): K 4.2, creatinine 1.44, HCT 39.7 Labs (06/05/14): K 4.1, creatinine 2.01 Labs (5/16): K 4.7, creatinine 1.56 Labs (7/16): K 4.7, creatinine 1.68, BNP 14, LDL 62 Labs (11/16): K 4.2, creatinine 1.93 Labs (1/17): K 4.6, creatinine 1.65, HCT 39.2 Labs (4/17): K 4.5, creatinine 1.98 Labs (6/17): K 4, creatinine 2 Labs (8/17): K 4.4, creatinine 1.64   Labs (10/17): K 3.9, creatinine 1.5, BNP 15 Labs (1/18): K 4.3, creatinine 1.59 Labs (4/18): LDL 46, HDL 57 Labs (5/18): K 4.6, creatinine 1.55 Labs (10/08/2016): K 4 Creatinine 1.9   PMH: 1. CVA: 3 prior CVAs, last in 1998.  Thought to be related to HTN. 2. HTN: Long-standing, says she has been hypertensive since age 28. Renal artery dopplers (4/16) were normal.  3. CKD: Suspect related to HTN. 4. Breast cancer: 2009, left mastectomy with chemotherapy.  5. Gout 6. Obese 7. TAH 8. CAD: LHC (3/16) with moderate diffuse disease, worst lesion being 50-70% mid RCA.  9. Cardiomyopathy: Echo (3/16) with EF 25-30%, moderate LVH.  Echo (6/16) with EF 35-40%, moderate LVH, diffuse hypokinesis with septal-lateral dyssynchrony.   Cardiac MRI (12/16) with EF 32%, diffuse hypokinesis, no contrast given due to CKD.  - Medtronic ICD 2/17.  - Echo (5/18): EF 40%, diffuse hypokinesis, moderate LVH, normal RV size and systolic function.  10. OSA: Using CPAP.  11. Event monitor (1/17) with NSR, occasional PVCs.   SH: From [place], moved to [place] in 2015 to be near daughter.  Single.  Quit smoking 2009.    FH: HTN runs in family.  Father with MI.   ROS: All systems reviewed and negative except as per HPI.   Current Outpatient Medications  Medication Sig Dispense Refill  . allopurinol (ZYLOPRIM) 100 MG tablet Take 200 mg by mouth daily.   0  . aspirin EC 81 MG tablet Take 1 tablet (81 mg total) by mouth daily. 90 tablet 3  . atorvastatin (LIPITOR) 40 MG tablet Take 1 tablet (40 mg total) by mouth daily. 30 tablet 0  . carvedilol (COREG) 25 MG tablet Take 1 tablet (25 mg total) by mouth 2 (two) times daily. 60 tablet 0  . colchicine 0.6 MG tablet Take 0.6 mg by mouth daily.    . furosemide (LASIX) 20 MG tablet Take 2 tablets (40 mg total) by mouth every other day. 30 tablet 0  . gabapentin (NEURONTIN) 300 MG capsule Take 300 mg by mouth 3 (three) times daily.    . isosorbide-hydrALAZINE (BIDIL) 20-37.5 MG tablet Take 2 tablets by mouth 3 (three) times daily. 180 tablet 0  .  naproxen sodium (ANAPROX) 220 MG tablet Take 220 mg by mouth daily as needed (back pain). Reported on 08/26/2015    . omeprazole (PRILOSEC) 40 MG capsule Take 40 mg by mouth daily.  0  . sacubitril-valsartan (ENTRESTO) 97-103 MG Take 1 tablet by mouth 2 (two) times daily. 60 tablet 0  . spironolactone (ALDACTONE) 25 MG tablet Take 1 tablet (25 mg total) by mouth daily. 30 tablet 0  . traMADol (ULTRAM) 50 MG tablet Take 1 tablet (50 mg total) by mouth every 12 (twelve) hours as needed (twice daily as needed for pain). 60 tablet 0   No current facility-administered medications for this encounter.    BP 132/78 (BP Location: Right Arm, Patient Position: Sitting,  Cuff Size: Large)   Pulse 78   Wt 223 lb (101.2 kg)   SpO2 99%   BMI 39.50 kg/m    Wt Readings from Last 3 Encounters:  12/27/16 223 lb (101.2 kg)  12/24/16 219 lb 6.4 oz (99.5 kg)  12/17/16 221 lb 9.6 oz (100.5 kg)   General:  Well appearing. No resp difficulty. Walked in the clinic with a walker.  HEENT: normal Neck: supple. JVP 9-10. Carotids 2+ bilat; no bruits. No lymphadenopathy or thryomegaly appreciated. Cor: PMI nondisplaced. Regular rate & rhythm. No rubs, gallops or murmurs. Lungs: clear Abdomen: soft, nontender, nondistended. No hepatosplenomegaly. No bruits or masses. Good bowel sounds. Extremities: no cyanosis, clubbing, rash, R and LLE trace edema Neuro: alert & orientedx3, cranial nerves grossly intact. moves all 4 extremities w/o difficulty. Affect pleasant   Assessment/Plan: 1. HTN: Has history of CVA and CKD.  Renal artery dopplers were normal.   Stable. Continue current regimen. 2. CAD: Moderate diffuse CAD, no severe obstruction.  This did not cause her cardiomyopathy.  -No S/S ischemia   - Continue ASA 81 - Continue atorvastatin 40 mg daily, good lipids in 4/18.    3. CKD III:  . - Avoid NSAID use.   -BMET today.  4. 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  ECHO 06/2016 EF 40%.  Medtronic ICD- optivol- fluid index trending up. Activity less than 1 hour.  NYHA II. Limited by knee pain. Volume status mildly elevated. Increase lasix to 60 mg daily for 2 days then back to 40 mg daily. .  On goal dose bb and entresto.  Continue spiro.  Continue bidil 1 tab three times day.  5. OSA: Restart CPAP.  6. Polypharmacy: Continue paramedicine.   Follow up in 3 months. Continue Paramedicine.   Greater than 50% of the (total minutes 25) visit spent in counseling/coordination of care regarding heart failure and low sodium diet.    Darrick Grinder, NP-C  12/27/2016

## 2016-12-27 NOTE — Telephone Encounter (Signed)
Patient aware.

## 2016-12-27 NOTE — Telephone Encounter (Signed)
-----   Message from [place], NP sent at 12/27/2016  2:38 PM EST ----- Volume overloaded today. Repeat BMET in 10 days.

## 2016-12-28 ENCOUNTER — Other Ambulatory Visit (HOSPITAL_COMMUNITY): Payer: Self-pay

## 2016-12-28 NOTE — Progress Notes (Signed)
chp visit to refill pts pill box; she forgot to bring them with her to her AHF clinic appointment yesterday. Pt has no complaints.

## 2016-12-29 ENCOUNTER — Encounter (HOSPITAL_COMMUNITY): Payer: Self-pay

## 2017-01-04 ENCOUNTER — Other Ambulatory Visit (HOSPITAL_COMMUNITY): Payer: Self-pay

## 2017-01-04 NOTE — Progress Notes (Signed)
Paramedicine Encounter    Patient ID: Tara Dunn, female    DOB: [birth year], 76 y.o.   MRN: [id number]   Patient Care Team: Fanny Bien, MD as PCP - General (Family Medicine)  Patient Active Problem List   Diagnosis Date Noted  . Joint pain 12/09/2015  . ICD (implantable cardioverter-defibrillator) in place 03/26/2015  . Syncope 03/06/2015  . Obesity (BMI 30-39.9) 01/21/2015  . Excessive daytime sleepiness 08/04/2014  . OSA (obstructive sleep apnea) 08/04/2014  . Essential hypertension 06/13/2014  . Chronic systolic CHF (congestive heart failure) ([place]) 05/20/2014  . Congestive heart disease ([place])   . CKD (chronic kidney disease), stage III ([place]) 05/03/2014  . Hx of stroke without residual deficits 05/03/2014    Current Outpatient Medications:  .  allopurinol (ZYLOPRIM) 100 MG tablet, Take 200 mg by mouth daily. , Disp: , Rfl: 0 .  aspirin EC 81 MG tablet, Take 1 tablet (81 mg total) by mouth daily., Disp: 90 tablet, Rfl: 3 .  atorvastatin (LIPITOR) 40 MG tablet, Take 1 tablet (40 mg total) by mouth daily., Disp: 30 tablet, Rfl: 0 .  carvedilol (COREG) 25 MG tablet, Take 1 tablet (25 mg total) by mouth 2 (two) times daily., Disp: 60 tablet, Rfl: 0 .  colchicine 0.6 MG tablet, Take 0.6 mg by mouth daily., Disp: , Rfl:  .  furosemide (LASIX) 20 MG tablet, Take 2 tablets (40 mg total) by mouth every other day., Disp: 30 tablet, Rfl: 0 .  gabapentin (NEURONTIN) 300 MG capsule, Take 300 mg by mouth 3 (three) times daily., Disp: , Rfl:  .  isosorbide-hydrALAZINE (BIDIL) 20-37.5 MG tablet, Take 2 tablets by mouth 3 (three) times daily., Disp: 180 tablet, Rfl: 0 .  naproxen sodium (ANAPROX) 220 MG tablet, Take 220 mg by mouth daily as needed (back pain). Reported on 08/26/2015, Disp: , Rfl:  .  omeprazole (PRILOSEC) 40 MG capsule, Take 40 mg by mouth daily., Disp: , Rfl: 0 .  sacubitril-valsartan (ENTRESTO) 97-103 MG, Take 1 tablet by mouth 2 (two) times daily., Disp: 60 tablet,  Rfl: 0 .  spironolactone (ALDACTONE) 25 MG tablet, Take 1 tablet (25 mg total) by mouth daily., Disp: 30 tablet, Rfl: 0 .  traMADol (ULTRAM) 50 MG tablet, Take 1 tablet (50 mg total) by mouth every 12 (twelve) hours as needed (twice daily as needed for pain)., Disp: 60 tablet, Rfl: 0 Allergies  Allergen Reactions  . Shellfish Allergy Hives      Social History   Socioeconomic History  . Marital status: Widowed    Spouse name: Not on file  . Number of children: Not on file  . Years of education: Not on file  . Highest education level: Not on file  Social Needs  . Financial resource strain: Not on file  . Food insecurity - worry: Not on file  . Food insecurity - inability: Not on file  . Transportation needs - medical: Not on file  . Transportation needs - non-medical: Not on file  Occupational History  . Not on file  Tobacco Use  . Smoking status: Former Smoker    Packs/day: 0.50    Years: 40.00    Pack years: 20.00    Types: Cigarettes  . Smokeless tobacco: Never Used  . Tobacco comment: Quit in 2009.  Substance and Sexual Activity  . Alcohol use: No    Alcohol/week: 0.0 oz    Comment: "drank occasionally when I was young; last drink was in the late 1990s"  .  Drug use: No  . Sexual activity: No  Other Topics Concern  . Not on file  Social History Narrative   Lives alone in an independent living facility.  Education: 10th grade.     Retired from [employer] work.  Moved here from [place] in 2015.    Physical Exam  Constitutional: She is oriented to person, place, and time.  Cardiovascular: Normal rate and regular rhythm.  Pulmonary/Chest: Effort normal and breath sounds normal. No respiratory distress. She has no wheezes. She has no rales.  Musculoskeletal: Normal range of motion. She exhibits edema.  Neurological: She is alert and oriented to person, place, and time.  Skin: Skin is warm and dry.        Future Appointments  Date Time Provider [place]   01/06/2017 11:30 AM [facility] LAB [facility] None  02/16/2017  8:50 AM [facility] DEVICE REMOTES [facility]  03/21/2017 10:00 AM [facility] PA/NP [facility] None    BP 126/78 (BP Location: Left Arm, Patient Position: Sitting, Cuff Size: Large)   Pulse 82   Resp 16   Wt 221 lb 12.8 oz (100.6 kg)   SpO2 97%   BMI 39.29 kg/m   Weight yesterday- did not weigh Last visit weight- 223 lb  Ms Munger was seen at home today and reported feeling well. She denied SOB, headache of dizziness. She said she has been taking her medications but has not been weighing daily, which she said is an ongoing issue and Karena Addison is aware. She had lower extremity pitting edema but told me this was normal for her. Her pillbox was prefilled from last week by [facility] and she was in a hurry to go out with her daughter so our visit was brief.  Time spent with patient: 20 minutes  Jacquiline Doe, EMT 01/04/17  ACTION: Home visit completed Next visit planned for 1 week

## 2017-01-06 ENCOUNTER — Ambulatory Visit (HOSPITAL_COMMUNITY)
Admission: RE | Admit: 2017-01-06 | Discharge: 2017-01-06 | Disposition: A | Discharge status: Home / Self Care | Payer: PPO | Source: Ambulatory Visit | Attending: Cardiology | Admitting: Cardiology

## 2017-01-06 DIAGNOSIS — I5022 Chronic systolic (congestive) heart failure: Secondary | ICD-10-CM | POA: Insufficient documentation

## 2017-01-06 LAB — BASIC METABOLIC PANEL
Anion gap: 8 (ref 5–15)
BUN: 30 mg/dL — AB (ref 6–20)
CHLORIDE: 106 mmol/L (ref 101–111)
CO2: 26 mmol/L (ref 22–32)
CREATININE: 1.84 mg/dL — AB (ref 0.44–1.00)
Calcium: 9.6 mg/dL (ref 8.9–10.3)
GFR calc Af Amer: 29 mL/min — ABNORMAL LOW (ref >60)
GFR calc non Af Amer: 25 mL/min — ABNORMAL LOW (ref >60)
GLUCOSE: 99 mg/dL (ref 65–99)
Potassium: 4.2 mmol/L (ref 3.5–5.1)
SODIUM: 140 mmol/L (ref 135–145)

## 2017-01-13 ENCOUNTER — Encounter: Payer: Self-pay | Admitting: Family Medicine

## 2017-01-13 ENCOUNTER — Other Ambulatory Visit (HOSPITAL_COMMUNITY): Payer: Self-pay

## 2017-01-13 ENCOUNTER — Telehealth (HOSPITAL_COMMUNITY): Payer: Self-pay | Admitting: *Deleted

## 2017-01-13 ENCOUNTER — Encounter (HOSPITAL_COMMUNITY): Payer: Self-pay

## 2017-01-13 NOTE — Telephone Encounter (Signed)
Dee with paramedicine called to report pt is lethargic and bp 80/62.  Pt took her morning meds without medicine she normally takes all meds with food.  Per Amy hold evening dose of carvedilol, bidil, and entresto. Resume normal regimen tomorrow and remember to take all medications with food. Pt aware and agreeable.

## 2017-01-13 NOTE — Progress Notes (Signed)
Paramedicine Encounter    Patient ID: Tara Dunn, female    DOB: 05-17-1940, 76 y.o.   MRN: [id number]    Patient Care Team: Fanny Bien, MD as PCP - General (Family Medicine)  Patient Active Problem List   Diagnosis Date Noted  . Joint pain 12/09/2015  . ICD (implantable cardioverter-defibrillator) in place 03/26/2015  . Syncope 03/06/2015  . Obesity (BMI 30-39.9) 01/21/2015  . Excessive daytime sleepiness 08/04/2014  . OSA (obstructive sleep apnea) 08/04/2014  . Essential hypertension 06/13/2014  . Chronic systolic CHF (congestive heart failure) ([place]) 05/20/2014  . Congestive heart disease ([place])   . CKD (chronic kidney disease), stage III ([place]) 05/03/2014  . Hx of stroke without residual deficits 05/03/2014    Current Outpatient Medications:  .  allopurinol (ZYLOPRIM) 100 MG tablet, Take 200 mg by mouth daily. , Disp: , Rfl: 0 .  aspirin EC 81 MG tablet, Take 1 tablet (81 mg total) by mouth daily., Disp: 90 tablet, Rfl: 3 .  atorvastatin (LIPITOR) 40 MG tablet, Take 1 tablet (40 mg total) by mouth daily., Disp: 30 tablet, Rfl: 0 .  carvedilol (COREG) 25 MG tablet, Take 1 tablet (25 mg total) by mouth 2 (two) times daily., Disp: 60 tablet, Rfl: 0 .  colchicine 0.6 MG tablet, Take 0.6 mg by mouth daily., Disp: , Rfl:  .  furosemide (LASIX) 20 MG tablet, Take 2 tablets (40 mg total) by mouth every other day., Disp: 30 tablet, Rfl: 0 .  gabapentin (NEURONTIN) 300 MG capsule, Take 300 mg by mouth 3 (three) times daily., Disp: , Rfl:  .  isosorbide-hydrALAZINE (BIDIL) 20-37.5 MG tablet, Take 2 tablets by mouth 3 (three) times daily., Disp: 180 tablet, Rfl: 0 .  omeprazole (PRILOSEC) 40 MG capsule, Take 40 mg by mouth daily., Disp: , Rfl: 0 .  sacubitril-valsartan (ENTRESTO) 97-103 MG, Take 1 tablet by mouth 2 (two) times daily., Disp: 60 tablet, Rfl: 0 .  spironolactone (ALDACTONE) 25 MG tablet, Take 1 tablet (25 mg total) by mouth daily., Disp: 30 tablet, Rfl: 0 .   naproxen sodium (ANAPROX) 220 MG tablet, Take 220 mg by mouth daily as needed (back pain). Reported on 08/26/2015, Disp: , Rfl:  .  traMADol (ULTRAM) 50 MG tablet, Take 1 tablet (50 mg total) by mouth every 12 (twelve) hours as needed (twice daily as needed for pain)., Disp: 60 tablet, Rfl: 0 Allergies  Allergen Reactions  . Shellfish Allergy Hives     Social History   Socioeconomic History  . Marital status: Widowed    Spouse name: Not on file  . Number of children: Not on file  . Years of education: Not on file  . Highest education level: Not on file  Social Needs  . Financial resource strain: Not on file  . Food insecurity - worry: Not on file  . Food insecurity - inability: Not on file  . Transportation needs - medical: Not on file  . Transportation needs - non-medical: Not on file  Occupational History  . Not on file  Tobacco Use  . Smoking status: Former Smoker    Packs/day: 0.50    Years: 40.00    Pack years: 20.00    Types: Cigarettes  . Smokeless tobacco: Never Used  . Tobacco comment: Quit in 2009.  Substance and Sexual Activity  . Alcohol use: No    Alcohol/week: 0.0 oz    Comment: "drank occasionally when I was young; last drink was in the late 1990s"  .  Drug use: No  . Sexual activity: No  Other Topics Concern  . Not on file  Social History Narrative   Lives alone in an independent living facility.  Education: 10th grade.     Retired from [employer] work.  Moved here from [place] in 2015.    Physical Exam  Pulmonary/Chest: No respiratory distress. She has no wheezes. She has no rales.  Abdominal: She exhibits no distension. There is no tenderness. There is no guarding.  Musculoskeletal: She exhibits no edema.  Skin: Skin is warm and dry. She is not diaphoretic.        Future Appointments  Date Time Provider [place]  02/16/2017  8:50 AM [facility] DEVICE REMOTES [facility]  03/21/2017 10:00 AM [facility] PA/NP [facility] None    ATF [name]  x4 sitting on the couch c/o weakness. Pt stated that she took her medications this morning before she ate breakfast.  Pt think that she feel weak due to the same.  Pt had severe knee pain for the past two days.  Pt has chronic hx of the same.  Pt stated that "nothing that she is doing is relieving the pain".  Pt would like to ask her pcp about the pain clinic.  Pt has taken most of her medications from the first pill box but missed several from the second box.  She took her meds today and yesterday.  Pt denies sob, chest pain and dizziness.  After assessing pt's bp; the heart clinic was contacted due to pt being hypotensive.  Pt didn't weigh today and didn't feel like weighing yesterday due to the knee pain.   I spoke with [facility] and she stated that Amy advised pt not take her evening bp meds.  The following meds were taken out of her pill box for tonight entresto, bidil and carvediol. Pt is to resume her normal medications tomorrow.  If the symptoms continue she is call the heart failure clinic for further. rx bottles verified and pill box refilled.   BP (!) 80/62 (BP Location: Right Arm, Patient Position: Sitting, Cuff Size: Large)   Pulse 77   Resp 16   Wt 215 lb 1 oz (97.6 kg)   SpO2 96%   BMI 38.10 kg/m    **rx called in: bidil entresto filled until mon  Clarisa Danser, EMT Paramedic 01/13/2017    ACTION: Home visit completed Next visit planned for next tuesday

## 2017-01-18 ENCOUNTER — Other Ambulatory Visit (HOSPITAL_COMMUNITY): Payer: Self-pay

## 2017-01-18 NOTE — Progress Notes (Signed)
Paramedicine Encounter    Patient ID: Tara Dunn, female    DOB: 23-Jun-1940, 76 y.o.   MRN: [id number]    Patient Care Team: Fanny Bien, MD as PCP - General (Family Medicine)  Patient Active Problem List   Diagnosis Date Noted  . Joint pain 12/09/2015  . ICD (implantable cardioverter-defibrillator) in place 03/26/2015  . Syncope 03/06/2015  . Obesity (BMI 30-39.9) 01/21/2015  . Excessive daytime sleepiness 08/04/2014  . OSA (obstructive sleep apnea) 08/04/2014  . Essential hypertension 06/13/2014  . Chronic systolic CHF (congestive heart failure) ([place]) 05/20/2014  . Congestive heart disease ([place])   . CKD (chronic kidney disease), stage III ([place]) 05/03/2014  . Hx of stroke without residual deficits 05/03/2014    Current Outpatient Medications:  .  allopurinol (ZYLOPRIM) 100 MG tablet, Take 200 mg by mouth daily. , Disp: , Rfl: 0 .  aspirin EC 81 MG tablet, Take 1 tablet (81 mg total) by mouth daily., Disp: 90 tablet, Rfl: 3 .  atorvastatin (LIPITOR) 40 MG tablet, Take 1 tablet (40 mg total) by mouth daily., Disp: 30 tablet, Rfl: 0 .  carvedilol (COREG) 25 MG tablet, Take 1 tablet (25 mg total) by mouth 2 (two) times daily., Disp: 60 tablet, Rfl: 0 .  colchicine 0.6 MG tablet, Take 0.6 mg by mouth daily., Disp: , Rfl:  .  furosemide (LASIX) 20 MG tablet, Take 2 tablets (40 mg total) by mouth every other day., Disp: 30 tablet, Rfl: 0 .  gabapentin (NEURONTIN) 300 MG capsule, Take 300 mg by mouth 3 (three) times daily., Disp: , Rfl:  .  isosorbide-hydrALAZINE (BIDIL) 20-37.5 MG tablet, Take 2 tablets by mouth 3 (three) times daily., Disp: 180 tablet, Rfl: 0 .  omeprazole (PRILOSEC) 40 MG capsule, Take 40 mg by mouth daily., Disp: , Rfl: 0 .  spironolactone (ALDACTONE) 25 MG tablet, Take 1 tablet (25 mg total) by mouth daily., Disp: 30 tablet, Rfl: 0 .  naproxen sodium (ANAPROX) 220 MG tablet, Take 220 mg by mouth daily as needed (back pain). Reported on 08/26/2015, Disp: ,  Rfl:  .  sacubitril-valsartan (ENTRESTO) 97-103 MG, Take 1 tablet by mouth 2 (two) times daily., Disp: 60 tablet, Rfl: 0 .  traMADol (ULTRAM) 50 MG tablet, Take 1 tablet (50 mg total) by mouth every 12 (twelve) hours as needed (twice daily as needed for pain)., Disp: 60 tablet, Rfl: 0 Allergies  Allergen Reactions  . Shellfish Allergy Hives     Social History   Socioeconomic History  . Marital status: Widowed    Spouse name: Not on file  . Number of children: Not on file  . Years of education: Not on file  . Highest education level: Not on file  Social Needs  . Financial resource strain: Not on file  . Food insecurity - worry: Not on file  . Food insecurity - inability: Not on file  . Transportation needs - medical: Not on file  . Transportation needs - non-medical: Not on file  Occupational History  . Not on file  Tobacco Use  . Smoking status: Former Smoker    Packs/day: 0.50    Years: 40.00    Pack years: 20.00    Types: Cigarettes  . Smokeless tobacco: Never Used  . Tobacco comment: Quit in 2009.  Substance and Sexual Activity  . Alcohol use: No    Alcohol/week: 0.0 oz    Comment: "drank occasionally when I was young; last drink was in the late 1990s"  .  Drug use: No  . Sexual activity: No  Other Topics Concern  . Not on file  Social History Narrative   Lives alone in an independent living facility.  Education: 10th grade.     Retired from [employer] work.  Moved here from [place] in 2015.    Physical Exam  Pulmonary/Chest: No respiratory distress. She has no wheezes. She has no rales.  Abdominal: She exhibits no distension. There is no tenderness. There is no guarding.  Musculoskeletal: She exhibits no edema.  Skin: Skin is warm and dry. She is not diaphoretic.        Future Appointments  Date Time Provider [place]  02/16/2017  8:50 AM [facility] DEVICE REMOTES [facility]  03/21/2017 10:00 AM [facility] PA/NP [facility] None    ATF [name] x4  sitting on the couch with no complaints.  She stated that her knees only hurt while she's walking but the pain is relieved when she sits down.  Pt had hypotension during our last visit, but she's feel "great today compared to our last visit". Pt stated that she believe her symptoms were due to her not eating prior to taking her medications.  Pt has taken all of her meds besides entresto, the pharmacy didn't call to let her know that they were ready for pick up.  Pt vitals noted.  I called heart failure clinic and advised them of her BP. Susie advised pt to continue to take the entresto unless she start to "feel bad, then call back for further". I told pt the same and she agreed.  Pt will have enough entresto for tonight and tomorrow morning.  Her daughter called during our visit and stated that she can pick up her meds Thursday.  Pt stated that she will call me once she bring them to her. rx bottles verified and pill box refilled.  Pt would like information about home health aide.  Pt is having a lot of trouble with getting around the house cooking, cleaning and personal hygeine.  Pt has [employer].    BP 102/74 (BP Location: Right Arm, Patient Position: Sitting, Cuff Size: Large)   Pulse 76   Resp 16   Wt 217 lb (98.4 kg)   SpO2 95%   BMI 38.44 kg/m   Weight yesterday-didn't weigh Last visit weight-215    Nealy Karapetian, EMT Paramedic 01/18/2017    ACTION: Home visit completed Next visit planned for next tuesday

## 2017-01-26 ENCOUNTER — Other Ambulatory Visit (HOSPITAL_COMMUNITY): Payer: Self-pay

## 2017-01-26 NOTE — Progress Notes (Signed)
Paramedicine Encounter    Patient ID: Tara Dunn, female    DOB: April 14, 1940, 76 y.o.   MRN: [id number]    Patient Care Team: Fanny Bien, MD as PCP - General (Family Medicine)  Patient Active Problem List   Diagnosis Date Noted  . Joint pain 12/09/2015  . ICD (implantable cardioverter-defibrillator) in place 03/26/2015  . Syncope 03/06/2015  . Obesity (BMI 30-39.9) 01/21/2015  . Excessive daytime sleepiness 08/04/2014  . OSA (obstructive sleep apnea) 08/04/2014  . Essential hypertension 06/13/2014  . Chronic systolic CHF (congestive heart failure) ([place]) 05/20/2014  . Congestive heart disease ([place])   . CKD (chronic kidney disease), stage III ([place]) 05/03/2014  . Hx of stroke without residual deficits 05/03/2014    Current Outpatient Medications:  .  allopurinol (ZYLOPRIM) 100 MG tablet, Take 200 mg by mouth daily. , Disp: , Rfl: 0 .  aspirin EC 81 MG tablet, Take 1 tablet (81 mg total) by mouth daily., Disp: 90 tablet, Rfl: 3 .  atorvastatin (LIPITOR) 40 MG tablet, Take 1 tablet (40 mg total) by mouth daily., Disp: 30 tablet, Rfl: 0 .  carvedilol (COREG) 25 MG tablet, Take 1 tablet (25 mg total) by mouth 2 (two) times daily., Disp: 60 tablet, Rfl: 0 .  colchicine 0.6 MG tablet, Take 0.6 mg by mouth daily., Disp: , Rfl:  .  furosemide (LASIX) 20 MG tablet, Take 2 tablets (40 mg total) by mouth every other day., Disp: 30 tablet, Rfl: 0 .  gabapentin (NEURONTIN) 300 MG capsule, Take 300 mg by mouth 3 (three) times daily., Disp: , Rfl:  .  isosorbide-hydrALAZINE (BIDIL) 20-37.5 MG tablet, Take 2 tablets by mouth 3 (three) times daily., Disp: 180 tablet, Rfl: 0 .  naproxen sodium (ANAPROX) 220 MG tablet, Take 220 mg by mouth daily as needed (back pain). Reported on 08/26/2015, Disp: , Rfl:  .  omeprazole (PRILOSEC) 40 MG capsule, Take 40 mg by mouth daily., Disp: , Rfl: 0 .  sacubitril-valsartan (ENTRESTO) 97-103 MG, Take 1 tablet by mouth 2 (two) times daily., Disp: 60  tablet, Rfl: 0 .  spironolactone (ALDACTONE) 25 MG tablet, Take 1 tablet (25 mg total) by mouth daily., Disp: 30 tablet, Rfl: 0 .  traMADol (ULTRAM) 50 MG tablet, Take 1 tablet (50 mg total) by mouth every 12 (twelve) hours as needed (twice daily as needed for pain)., Disp: 60 tablet, Rfl: 0 Allergies  Allergen Reactions  . Shellfish Allergy Hives     Social History   Socioeconomic History  . Marital status: Widowed    Spouse name: Not on file  . Number of children: Not on file  . Years of education: Not on file  . Highest education level: Not on file  Social Needs  . Financial resource strain: Not on file  . Food insecurity - worry: Not on file  . Food insecurity - inability: Not on file  . Transportation needs - medical: Not on file  . Transportation needs - non-medical: Not on file  Occupational History  . Not on file  Tobacco Use  . Smoking status: Former Smoker    Packs/day: 0.50    Years: 40.00    Pack years: 20.00    Types: Cigarettes  . Smokeless tobacco: Never Used  . Tobacco comment: Quit in 2009.  Substance and Sexual Activity  . Alcohol use: No    Alcohol/week: 0.0 oz    Comment: "drank occasionally when I was young; last drink was in the late 1990s"  .  Drug use: No  . Sexual activity: No  Other Topics Concern  . Not on file  Social History Narrative   Lives alone in an independent living facility.  Education: 10th grade.     Retired from [employer] work.  Moved here from [place] in 2015.    Physical Exam  Pulmonary/Chest: No respiratory distress. She has no wheezes. She has no rales.  Abdominal: She exhibits no distension. There is no tenderness.  Skin: She is not diaphoretic.        Future Appointments  Date Time Provider [place]  02/16/2017  8:50 AM [facility] DEVICE REMOTES [facility]  03/21/2017 10:00 AM [facility] PA/NP [facility] None    ATF [name] x4 sitting in her living room watching tv c/o lower back and knee pain. Pt has hx of  both but states that the tylenol isnt working.  Pt stated that she would like more information about the pain clinic (I advised her to speak with her PCP.  Pt has taken most of her medications this week, she missed a couple of afternoon doses and one night.  We talked about watching what she eats over the holidays and taking her medications as prescribed.  Pt stated that she is still eating once a day mainly, but she joked and stated that she must be eating all of a days amount all at once. Pt denies sob, dizziness and chest pain.  She has not had any recent falls. Pt would like a home health aide to assist her with daily routines such as bathing, cooking and cleaning. rx bottles verified and pill box refilled.  Pts son in law stated that the pharmacy was charging pt $100 dollars for one of her medications, so he didn't pick hr meds up.  I called to verify the same and the pharmacist stated that pt has a zero co-pay.  I went to the pharmacy and brought the rx back to pt and placed the remaining pills in her pill box.   There were no vitals taken for this visit.  *rx called in: gabapentin  Weight yesterday-didn't weigh Last visit weight-217    Nickolis Diel, EMT Paramedic 01/26/2017    ACTION: Home visit completed Next visit planned for next week

## 2017-02-03 ENCOUNTER — Encounter (HOSPITAL_COMMUNITY): Payer: Self-pay

## 2017-02-03 ENCOUNTER — Other Ambulatory Visit (HOSPITAL_COMMUNITY): Payer: Self-pay

## 2017-02-03 NOTE — Progress Notes (Signed)
Paramedicine Encounter    Patient ID: Tara Dunn, female    DOB: 02/02/1941, 76 y.o.   MRN: [id number]    Patient Care Team: Fanny Bien, MD as PCP - General (Family Medicine)  Patient Active Problem List   Diagnosis Date Noted  . Joint pain 12/09/2015  . ICD (implantable cardioverter-defibrillator) in place 03/26/2015  . Syncope 03/06/2015  . Obesity (BMI 30-39.9) 01/21/2015  . Excessive daytime sleepiness 08/04/2014  . OSA (obstructive sleep apnea) 08/04/2014  . Essential hypertension 06/13/2014  . Chronic systolic CHF (congestive heart failure) ([place]) 05/20/2014  . Congestive heart disease ([place])   . CKD (chronic kidney disease), stage III ([place]) 05/03/2014  . Hx of stroke without residual deficits 05/03/2014    Current Outpatient Medications:  .  allopurinol (ZYLOPRIM) 100 MG tablet, Take 200 mg by mouth daily. , Disp: , Rfl: 0 .  aspirin EC 81 MG tablet, Take 1 tablet (81 mg total) by mouth daily., Disp: 90 tablet, Rfl: 3 .  atorvastatin (LIPITOR) 40 MG tablet, Take 1 tablet (40 mg total) by mouth daily., Disp: 30 tablet, Rfl: 0 .  carvedilol (COREG) 25 MG tablet, Take 1 tablet (25 mg total) by mouth 2 (two) times daily., Disp: 60 tablet, Rfl: 0 .  colchicine 0.6 MG tablet, Take 0.6 mg by mouth daily., Disp: , Rfl:  .  furosemide (LASIX) 20 MG tablet, Take 2 tablets (40 mg total) by mouth every other day., Disp: 30 tablet, Rfl: 0 .  gabapentin (NEURONTIN) 300 MG capsule, Take 300 mg by mouth 3 (three) times daily., Disp: , Rfl:  .  isosorbide-hydrALAZINE (BIDIL) 20-37.5 MG tablet, Take 2 tablets by mouth 3 (three) times daily., Disp: 180 tablet, Rfl: 0 .  omeprazole (PRILOSEC) 40 MG capsule, Take 40 mg by mouth daily., Disp: , Rfl: 0 .  sacubitril-valsartan (ENTRESTO) 97-103 MG, Take 1 tablet by mouth 2 (two) times daily., Disp: 60 tablet, Rfl: 0 .  spironolactone (ALDACTONE) 25 MG tablet, Take 1 tablet (25 mg total) by mouth daily., Disp: 30 tablet, Rfl: 0 .   naproxen sodium (ANAPROX) 220 MG tablet, Take 220 mg by mouth daily as needed (back pain). Reported on 08/26/2015, Disp: , Rfl:  .  traMADol (ULTRAM) 50 MG tablet, Take 1 tablet (50 mg total) by mouth every 12 (twelve) hours as needed (twice daily as needed for pain)., Disp: 60 tablet, Rfl: 0 Allergies  Allergen Reactions  . Shellfish Allergy Hives     Social History   Socioeconomic History  . Marital status: Widowed    Spouse name: Not on file  . Number of children: Not on file  . Years of education: Not on file  . Highest education level: Not on file  Social Needs  . Financial resource strain: Not on file  . Food insecurity - worry: Not on file  . Food insecurity - inability: Not on file  . Transportation needs - medical: Not on file  . Transportation needs - non-medical: Not on file  Occupational History  . Not on file  Tobacco Use  . Smoking status: Former Smoker    Packs/day: 0.50    Years: 40.00    Pack years: 20.00    Types: Cigarettes  . Smokeless tobacco: Never Used  . Tobacco comment: Quit in 2009.  Substance and Sexual Activity  . Alcohol use: No    Alcohol/week: 0.0 oz    Comment: "drank occasionally when I was young; last drink was in the late 1990s"  .  Drug use: No  . Sexual activity: No  Other Topics Concern  . Not on file  Social History Narrative   Lives alone in an independent living facility.  Education: 10th grade.     Retired from [employer] work.  Moved here from [place] in 2015.    Physical Exam  Pulmonary/Chest: No respiratory distress. She has no wheezes. She has no rales.  Abdominal: She exhibits no distension. There is no tenderness. There is no guarding.  Musculoskeletal: She exhibits no edema.  Skin: Skin is warm and dry. She is not diaphoretic.        Future Appointments  Date Time Provider [place]  02/16/2017  8:50 AM [facility]ICE [facility]OT[facility]  03/21/2017 10:00 AM [facility] PA/NP [facility] None    ATF [name]  x4 sitting in the living room talking with her family.  Pt has missed both sat and sun doses (all three) for an unknown reason.  She stated that she had started feeling bad Tuesday but is vague in her description.  Pt stated that she hasnt had appetite in a while. Pt denies sob, dizziness and chest pain. rx bottles verified and pill box.  rx called in: Allopurinol  furosemide Gabapentin  Bidil(60 day supply due to them not having enough last week)  BP 118/68 (BP Location: Right Arm, Patient Position: Sitting, Cuff Size: Large)   Pulse 88   Resp 16   Wt 219 lb 6.4 oz (99.5 kg)   SpO2 98%   BMI 38.86 kg/m   Weight yesterday-didn't weigh Last visit weight-219    Mazell Aylesworth, EMT Paramedic 02/03/2017    ACTION: Home visit completed =---------------------------

## 2017-02-10 ENCOUNTER — Encounter (HOSPITAL_COMMUNITY): Payer: Self-pay

## 2017-02-10 ENCOUNTER — Other Ambulatory Visit (HOSPITAL_COMMUNITY): Payer: Self-pay | Admitting: *Deleted

## 2017-02-10 ENCOUNTER — Other Ambulatory Visit (HOSPITAL_COMMUNITY): Payer: Self-pay

## 2017-02-10 MED ORDER — SPIRONOLACTONE 25 MG PO TABS: 25.0000 mg | ORAL_TABLET | Freq: Every day | ORAL | 3 refills | Status: DC

## 2017-02-10 NOTE — Progress Notes (Signed)
Paramedicine Encounter    Patient ID: Tara Dunn, female    DOB: [birth year], 77 y.o.   MRN: [id number]    Patient Care Team: Tara Bien, MD as PCP - General (Family Medicine)  Patient Active Problem List   Diagnosis Date Noted  . Joint pain 12/09/2015  . ICD (implantable cardioverter-defibrillator) in place 03/26/2015  . Syncope 03/06/2015  . Obesity (BMI 30-39.9) 01/21/2015  . Excessive daytime sleepiness 08/04/2014  . OSA (obstructive sleep apnea) 08/04/2014  . Essential hypertension 06/13/2014  . Chronic systolic CHF (congestive heart failure) ([place]) 05/20/2014  . Congestive heart disease ([place])   . CKD (chronic kidney disease), stage III ([place]) 05/03/2014  . Hx of stroke without residual deficits 05/03/2014    Current Outpatient Medications:  .  allopurinol (ZYLOPRIM) 100 MG tablet, Take 200 mg by mouth daily. , Disp: , Rfl: 0 .  aspirin EC 81 MG tablet, Take 1 tablet (81 mg total) by mouth daily., Disp: 90 tablet, Rfl: 3 .  atorvastatin (LIPITOR) 40 MG tablet, Take 1 tablet (40 mg total) by mouth daily., Disp: 30 tablet, Rfl: 0 .  carvedilol (COREG) 25 MG tablet, Take 1 tablet (25 mg total) by mouth 2 (two) times daily., Disp: 60 tablet, Rfl: 0 .  colchicine 0.6 MG tablet, Take 0.6 mg by mouth daily., Disp: , Rfl:  .  furosemide (LASIX) 20 MG tablet, Take 2 tablets (40 mg total) by mouth every other day., Disp: 30 tablet, Rfl: 0 .  gabapentin (NEURONTIN) 300 MG capsule, Take 300 mg by mouth 3 (three) times daily., Disp: , Rfl:  .  isosorbide-hydrALAZINE (BIDIL) 20-37.5 MG tablet, Take 2 tablets by mouth 3 (three) times daily., Disp: 180 tablet, Rfl: 0 .  omeprazole (PRILOSEC) 40 MG capsule, Take 40 mg by mouth daily., Disp: , Rfl: 0 .  sacubitril-valsartan (ENTRESTO) 97-103 MG, Take 1 tablet by mouth 2 (two) times daily., Disp: 60 tablet, Rfl: 0 .  naproxen sodium (ANAPROX) 220 MG tablet, Take 220 mg by mouth daily as needed (back pain). Reported on 08/26/2015, Disp:  , Rfl:  .  spironolactone (ALDACTONE) 25 MG tablet, Take 1 tablet (25 mg total) by mouth daily., Disp: 30 tablet, Rfl: 3 .  traMADol (ULTRAM) 50 MG tablet, Take 1 tablet (50 mg total) by mouth every 12 (twelve) hours as needed (twice daily as needed for pain)., Disp: 60 tablet, Rfl: 0 Allergies  Allergen Reactions  . Shellfish Allergy Hives     Social History   Socioeconomic History  . Marital status: Widowed    Spouse name: Not on file  . Number of children: Not on file  . Years of education: Not on file  . Highest education level: Not on file  Social Needs  . Financial resource strain: Not on file  . Food insecurity - worry: Not on file  . Food insecurity - inability: Not on file  . Transportation needs - medical: Not on file  . Transportation needs - non-medical: Not on file  Occupational History  . Not on file  Tobacco Use  . Smoking status: Former Smoker    Packs/day: 0.50    Years: 40.00    Pack years: 20.00    Types: Cigarettes  . Smokeless tobacco: Never Used  . Tobacco comment: Quit in 2009.  Substance and Sexual Activity  . Alcohol use: No    Alcohol/week: 0.0 oz    Comment: "drank occasionally when I was young; last drink was in the late 1990s"  .  Drug use: No  . Sexual activity: No  Other Topics Concern  . Not on file  Social History Narrative   Lives alone in an independent living facility.  Education: 10th grade.     Retired from [employer] work.  Moved here from [place] in 2015.    Physical Exam  Pulmonary/Chest: No respiratory distress.  Abdominal: She exhibits no distension. There is no tenderness.  Musculoskeletal: She exhibits no edema.  Skin: Skin is warm and dry. She is not diaphoretic.        Future Appointments  Date Time Provider [place]  02/16/2017  8:50 AM [facility]  03/21/2017 10:00 AM [facility] PA/NP [facility] None    ATF [name] x4 sitting on the couch c/o knee and back pain.  Pt has taken all  of her medications this week.  She is still trying to work on her diet, which has gotten better.  She stated that she would be able to get around better if her knees weren't hurting so much. Pt takes tylenol for the pain but is not getting any relief. Pt would like assistance around the house, cleaning, cooking and personal care. rx bottles verified and pill box refilled.   Pt made an Dr. Ernie Dunn Monday 230 for possible referral to pain specialist.  BP 130/84 (BP Location: Right Arm, Patient Position: Sitting)   Pulse 67   Wt 219 lb 6.4 oz (99.5 kg)   SpO2 95%   BMI 38.86 kg/m   Weight yesterday-didn't weigh Last visit weight-219  **rx called in: atoravastain Spirolactone omerprazole   Tara Dunn, EMT Paramedic 02/10/2017    ACTION: Home visit completed Next visit planned for next week

## 2017-02-14 ENCOUNTER — Telehealth (HOSPITAL_COMMUNITY): Payer: Self-pay | Admitting: Pharmacist

## 2017-02-14 DIAGNOSIS — M170 Bilateral primary osteoarthritis of knee: Secondary | ICD-10-CM | POA: Diagnosis not present

## 2017-02-14 DIAGNOSIS — Z6838 Body mass index (BMI) 38.0-38.9, adult: Secondary | ICD-10-CM | POA: Diagnosis not present

## 2017-02-14 DIAGNOSIS — I509 Heart failure, unspecified: Secondary | ICD-10-CM | POA: Diagnosis not present

## 2017-02-14 DIAGNOSIS — M549 Dorsalgia, unspecified: Secondary | ICD-10-CM | POA: Diagnosis not present

## 2017-02-14 DIAGNOSIS — N181 Chronic kidney disease, stage 1: Secondary | ICD-10-CM | POA: Diagnosis not present

## 2017-02-14 NOTE — Telephone Encounter (Signed)
Attempted a PA for Entresto but [facility] is stating that a PA is not required this year.   [name]. [name], PharmD, BCPS, CPP Clinical Pharmacist Pager: [phone] Phone: [phone] 02/14/2017 11:02 AM

## 2017-02-16 ENCOUNTER — Ambulatory Visit (INDEPENDENT_AMBULATORY_CARE_PROVIDER_SITE_OTHER): Payer: PPO | Admitting: *Deleted

## 2017-02-16 DIAGNOSIS — I5022 Chronic systolic (congestive) heart failure: Secondary | ICD-10-CM

## 2017-02-16 DIAGNOSIS — Z95810 Presence of automatic (implantable) cardiac defibrillator: Secondary | ICD-10-CM

## 2017-02-16 NOTE — Progress Notes (Signed)
Remote ICD transmission.   

## 2017-02-17 ENCOUNTER — Encounter (HOSPITAL_COMMUNITY): Payer: Self-pay

## 2017-02-17 ENCOUNTER — Encounter: Payer: Self-pay | Admitting: Cardiology

## 2017-02-17 ENCOUNTER — Other Ambulatory Visit (HOSPITAL_COMMUNITY): Payer: Self-pay

## 2017-02-17 LAB — CUP PACEART REMOTE DEVICE CHECK
Battery Voltage: 3.02 V
Brady Statistic RV Percent Paced: 0.01 %
Date Time Interrogation Session: 20190109072824
HighPow Impedance: 70 Ohm
Implantable Lead Location: 753860
Implantable Pulse Generator Implant Date: 20170215
Lead Channel Impedance Value: 456 Ohm
Lead Channel Sensing Intrinsic Amplitude: 16.75 mV
Lead Channel Setting Pacing Amplitude: 2.5 V
MDC IDC LEAD IMPLANT DT: 20170215
MDC IDC MSMT BATTERY REMAINING LONGEVITY: 127 mo
MDC IDC MSMT LEADCHNL RV IMPEDANCE VALUE: 399 Ohm
MDC IDC MSMT LEADCHNL RV PACING THRESHOLD AMPLITUDE: 0.625 V
MDC IDC MSMT LEADCHNL RV PACING THRESHOLD PULSEWIDTH: 0.4 ms
MDC IDC MSMT LEADCHNL RV SENSING INTR AMPL: 16.75 mV
MDC IDC SET LEADCHNL RV PACING PULSEWIDTH: 0.4 ms
MDC IDC SET LEADCHNL RV SENSING SENSITIVITY: 0.3 mV

## 2017-02-17 NOTE — Progress Notes (Signed)
Paramedicine Encounter    Patient ID: Tara Dunn, female    DOB: 1940-03-06, 77 y.o.   MRN: [id number]    Patient Care Team: Fanny Bien, MD as PCP - General (Family Medicine)  Patient Active Problem List   Diagnosis Date Noted  . Joint pain 12/09/2015  . ICD (implantable cardioverter-defibrillator) in place 03/26/2015  . Syncope 03/06/2015  . Obesity (BMI 30-39.9) 01/21/2015  . Excessive daytime sleepiness 08/04/2014  . OSA (obstructive sleep apnea) 08/04/2014  . Essential hypertension 06/13/2014  . Chronic systolic CHF (congestive heart failure) ([place]) 05/20/2014  . Congestive heart disease ([place])   . CKD (chronic kidney disease), stage III ([place]) 05/03/2014  . Hx of stroke without residual deficits 05/03/2014    Current Outpatient Medications:  .  allopurinol (ZYLOPRIM) 100 MG tablet, Take 200 mg by mouth daily. , Disp: , Rfl: 0 .  aspirin EC 81 MG tablet, Take 1 tablet (81 mg total) by mouth daily., Disp: 90 tablet, Rfl: 3 .  atorvastatin (LIPITOR) 40 MG tablet, Take 1 tablet (40 mg total) by mouth daily., Disp: 30 tablet, Rfl: 0 .  carvedilol (COREG) 25 MG tablet, Take 1 tablet (25 mg total) by mouth 2 (two) times daily., Disp: 60 tablet, Rfl: 0 .  colchicine 0.6 MG tablet, Take 0.6 mg by mouth daily., Disp: , Rfl:  .  furosemide (LASIX) 20 MG tablet, Take 2 tablets (40 mg total) by mouth every other day., Disp: 30 tablet, Rfl: 0 .  gabapentin (NEURONTIN) 300 MG capsule, Take 300 mg by mouth 3 (three) times daily., Disp: , Rfl:  .  isosorbide-hydrALAZINE (BIDIL) 20-37.5 MG tablet, Take 2 tablets by mouth 3 (three) times daily., Disp: 180 tablet, Rfl: 0 .  omeprazole (PRILOSEC) 40 MG capsule, Take 40 mg by mouth daily., Disp: , Rfl: 0 .  sacubitril-valsartan (ENTRESTO) 97-103 MG, Take 1 tablet by mouth 2 (two) times daily., Disp: 60 tablet, Rfl: 0 .  spironolactone (ALDACTONE) 25 MG tablet, Take 1 tablet (25 mg total) by mouth daily., Disp: 30 tablet, Rfl: 3 .   naproxen sodium (ANAPROX) 220 MG tablet, Take 220 mg by mouth daily as needed (back pain). Reported on 08/26/2015, Disp: , Rfl:  .  traMADol (ULTRAM) 50 MG tablet, Take 1 tablet (50 mg total) by mouth every 12 (twelve) hours as needed (twice daily as needed for pain)., Disp: 60 tablet, Rfl: 0 Allergies  Allergen Reactions  . Shellfish Allergy Hives     Social History   Socioeconomic History  . Marital status: Widowed    Spouse name: Not on file  . Number of children: Not on file  . Years of education: Not on file  . Highest education level: Not on file  Social Needs  . Financial resource strain: Not on file  . Food insecurity - worry: Not on file  . Food insecurity - inability: Not on file  . Transportation needs - medical: Not on file  . Transportation needs - non-medical: Not on file  Occupational History  . Not on file  Tobacco Use  . Smoking status: Former Smoker    Packs/day: 0.50    Years: 40.00    Pack years: 20.00    Types: Cigarettes  . Smokeless tobacco: Never Used  . Tobacco comment: Quit in 2009.  Substance and Sexual Activity  . Alcohol use: No    Alcohol/week: 0.0 oz    Comment: "drank occasionally when I was young; last drink was in the late 1990s"  .  Drug use: No  . Sexual activity: No  Other Topics Concern  . Not on file  Social History Narrative   Lives alone in an independent living facility.  Education: 10th grade.     Retired from [employer] work.  Moved here from [place] in 2015.    Physical Exam  Pulmonary/Chest: No respiratory distress.  Abdominal: She exhibits no distension. There is no tenderness. There is no rebound.  Musculoskeletal: She exhibits no edema.  Skin: Skin is warm and dry. She is not diaphoretic.        Future Appointments  Date Time Provider [place]  03/21/2017 10:00 AM [facility] PA/NP [facility] None  04/11/2017  9:00 AM Sueanne Margarita, MD [facility]  05/18/2017  7:25 AM [facility] DEVICE REMOTES [facility]    ATF [name] x4 sitting on her couch with no complaints. She stated that she went to her pcp on Monday and started the knee injections again. She stated that she did not complete the injections before because she couldn't tell a difference because she was constantly in pain.  Pt also stated that she asked about a referral for home aide to help her with personal hygiene and cooking but she stated that she couldn't give her a referral.  Pt denies sob, chest pain and dizziness.  Pt has taken her am and evening meds but has skipped the afternoon meds.  We discussed her taking her meds as prescribed. rx bottles verified and pill box refilled.   BP 132/72 (BP Location: Right Arm, Patient Position: Sitting, Cuff Size: Large)   Pulse 69   Resp 16   Wt 222 lb 6.4 oz (100.9 kg)   SpO2 99%   BMI 39.40 kg/m    **rx called in: Atorvastatin Spirolactone Omeprazole entresto  Weight yesterday-222 Last visit weight-219    Alicyn Klann, EMT Paramedic 02/17/2017    ACTION: Home visit completed Next visit planned for next week

## 2017-02-24 ENCOUNTER — Other Ambulatory Visit (HOSPITAL_COMMUNITY): Payer: Self-pay

## 2017-02-24 ENCOUNTER — Encounter (HOSPITAL_COMMUNITY): Payer: Self-pay

## 2017-02-24 NOTE — Progress Notes (Signed)
Paramedicine Encounter    Patient ID: Tara Dunn, female    DOB: March 22, 1940, 77 y.o.   MRN: [id number]    Patient Care Team: Fanny Bien, MD as PCP - General (Family Medicine)  Patient Active Problem List   Diagnosis Date Noted  . Joint pain 12/09/2015  . ICD (implantable cardioverter-defibrillator) in place 03/26/2015  . Syncope 03/06/2015  . Obesity (BMI 30-39.9) 01/21/2015  . Excessive daytime sleepiness 08/04/2014  . OSA (obstructive sleep apnea) 08/04/2014  . Essential hypertension 06/13/2014  . Chronic systolic CHF (congestive heart failure) ([place]) 05/20/2014  . Congestive heart disease ([place])   . CKD (chronic kidney disease), stage III ([place]) 05/03/2014  . Hx of stroke without residual deficits 05/03/2014    Current Outpatient Medications:  .  allopurinol (ZYLOPRIM) 100 MG tablet, Take 200 mg by mouth daily. , Disp: , Rfl: 0 .  aspirin EC 81 MG tablet, Take 1 tablet (81 mg total) by mouth daily., Disp: 90 tablet, Rfl: 3 .  atorvastatin (LIPITOR) 40 MG tablet, Take 1 tablet (40 mg total) by mouth daily., Disp: 30 tablet, Rfl: 0 .  carvedilol (COREG) 25 MG tablet, Take 1 tablet (25 mg total) by mouth 2 (two) times daily., Disp: 60 tablet, Rfl: 0 .  colchicine 0.6 MG tablet, Take 0.6 mg by mouth daily., Disp: , Rfl:  .  furosemide (LASIX) 20 MG tablet, Take 2 tablets (40 mg total) by mouth every other day., Disp: 30 tablet, Rfl: 0 .  gabapentin (NEURONTIN) 300 MG capsule, Take 300 mg by mouth 3 (three) times daily., Disp: , Rfl:  .  isosorbide-hydrALAZINE (BIDIL) 20-37.5 MG tablet, Take 2 tablets by mouth 3 (three) times daily., Disp: 180 tablet, Rfl: 0 .  omeprazole (PRILOSEC) 40 MG capsule, Take 40 mg by mouth daily., Disp: , Rfl: 0 .  sacubitril-valsartan (ENTRESTO) 97-103 MG, Take 1 tablet by mouth 2 (two) times daily., Disp: 60 tablet, Rfl: 0 .  spironolactone (ALDACTONE) 25 MG tablet, Take 1 tablet (25 mg total) by mouth daily., Disp: 30 tablet, Rfl: 3 .   naproxen sodium (ANAPROX) 220 MG tablet, Take 220 mg by mouth daily as needed (back pain). Reported on 08/26/2015, Disp: , Rfl:  .  traMADol (ULTRAM) 50 MG tablet, Take 1 tablet (50 mg total) by mouth every 12 (twelve) hours as needed (twice daily as needed for pain)., Disp: 60 tablet, Rfl: 0 Allergies  Allergen Reactions  . Shellfish Allergy Hives     Social History   Socioeconomic History  . Marital status: Widowed    Spouse name: Not on file  . Number of children: Not on file  . Years of education: Not on file  . Highest education level: Not on file  Social Needs  . Financial resource strain: Not on file  . Food insecurity - worry: Not on file  . Food insecurity - inability: Not on file  . Transportation needs - medical: Not on file  . Transportation needs - non-medical: Not on file  Occupational History  . Not on file  Tobacco Use  . Smoking status: Former Smoker    Packs/day: 0.50    Years: 40.00    Pack years: 20.00    Types: Cigarettes  . Smokeless tobacco: Never Used  . Tobacco comment: Quit in 2009.  Substance and Sexual Activity  . Alcohol use: No    Alcohol/week: 0.0 oz    Comment: "drank occasionally when I was young; last drink was in the late 1990s"  .  Drug use: No  . Sexual activity: No  Other Topics Concern  . Not on file  Social History Narrative   Lives alone in an independent living facility.  Education: 10th grade.     Retired from [employer] work.  Moved here from [place] in 2015.    Physical Exam  Pulmonary/Chest: No respiratory distress.  Abdominal: She exhibits no distension. There is no tenderness. There is no guarding.  Musculoskeletal: She exhibits no edema.  Skin: Skin is warm and dry. She is not diaphoretic.        Future Appointments  Date Time Provider [place]  03/21/2017 10:00 AM [facility] PA/NP [facility] None  04/11/2017  9:00 AM Sueanne Margarita, MD [facility]  05/18/2017  7:25 AM [facility] DEVICE REMOTES [facility]    ATF [name] x4 sitting in her living room watching tv.  Pt has back and knee pain with hx of the same.  She is currently getting injections in her knees for the pain.  Pt has taken all of medications besides a few afternoon doses this week.  Pt stated that she is still doesn't remember where she placed several of her medications that she picked up on 02/12/17.  We looked around her apartment but still couldn't find them. Pt denies sob, chest pain and dizziness.  rx bottles verified and pill box refilled.   BP 134/70 (BP Location: Right Arm, Patient Position: Sitting, Cuff Size: Normal)   Pulse 76   Resp 16   Wt 219 lb 9.6 oz (99.6 kg)   SpO2 98%   BMI 38.90 kg/m   Weight yesterday-218 Last visit weight-222  Pt has lost her medications that she picked up last on 1/5 and 1/12 **rx called in: entresto  carvedilol  Ghazal Pevey, EMT Paramedic 02/24/2017    ACTION: Home visit completed Next visit planned for next week

## 2017-03-02 DIAGNOSIS — M1000 Idiopathic gout, unspecified site: Secondary | ICD-10-CM | POA: Diagnosis not present

## 2017-03-02 DIAGNOSIS — E782 Mixed hyperlipidemia: Secondary | ICD-10-CM | POA: Diagnosis not present

## 2017-03-02 DIAGNOSIS — N19 Unspecified kidney failure: Secondary | ICD-10-CM | POA: Diagnosis not present

## 2017-03-02 DIAGNOSIS — M170 Bilateral primary osteoarthritis of knee: Secondary | ICD-10-CM | POA: Diagnosis not present

## 2017-03-03 ENCOUNTER — Other Ambulatory Visit (HOSPITAL_COMMUNITY): Payer: Self-pay

## 2017-03-03 NOTE — Progress Notes (Signed)
Paramedicine Encounter    Patient ID: Tara Dunn, female    DOB: 1940/02/24, 77 y.o.   MRN: [id number]    Patient Care Team: Tara Bien, MD as PCP - General (Family Medicine)  Patient Active Problem List   Diagnosis Date Noted  . Joint pain 12/09/2015  . ICD (implantable cardioverter-defibrillator) in place 03/26/2015  . Syncope 03/06/2015  . Obesity (BMI 30-39.9) 01/21/2015  . Excessive daytime sleepiness 08/04/2014  . OSA (obstructive sleep apnea) 08/04/2014  . Essential hypertension 06/13/2014  . Chronic systolic CHF (congestive heart failure) ([place]) 05/20/2014  . Congestive heart disease ([place])   . CKD (chronic kidney disease), stage III ([place]) 05/03/2014  . Hx of stroke without residual deficits 05/03/2014    Current Outpatient Medications:  .  allopurinol (ZYLOPRIM) 100 MG tablet, Take 200 mg by mouth daily. , Disp: , Rfl: 0 .  aspirin EC 81 MG tablet, Take 1 tablet (81 mg total) by mouth daily., Disp: 90 tablet, Rfl: 3 .  carvedilol (COREG) 25 MG tablet, Take 1 tablet (25 mg total) by mouth 2 (two) times daily., Disp: 60 tablet, Rfl: 0 .  colchicine 0.6 MG tablet, Take 0.6 mg by mouth daily., Disp: , Rfl:  .  furosemide (LASIX) 20 MG tablet, Take 2 tablets (40 mg total) by mouth every other day., Disp: 30 tablet, Rfl: 0 .  gabapentin (NEURONTIN) 300 MG capsule, Take 300 mg by mouth 3 (three) times daily., Disp: , Rfl:  .  isosorbide-hydrALAZINE (BIDIL) 20-37.5 MG tablet, Take 2 tablets by mouth 3 (three) times daily., Disp: 180 tablet, Rfl: 0 .  omeprazole (PRILOSEC) 40 MG capsule, Take 40 mg by mouth daily., Disp: , Rfl: 0 .  sacubitril-valsartan (ENTRESTO) 97-103 MG, Take 1 tablet by mouth 2 (two) times daily., Disp: 60 tablet, Rfl: 0 .  atorvastatin (LIPITOR) 40 MG tablet, Take 1 tablet (40 mg total) by mouth daily., Disp: 30 tablet, Rfl: 0 .  naproxen sodium (ANAPROX) 220 MG tablet, Take 220 mg by mouth daily as needed (back pain). Reported on 08/26/2015, Disp:  , Rfl:  .  spironolactone (ALDACTONE) 25 MG tablet, Take 1 tablet (25 mg total) by mouth daily., Disp: 30 tablet, Rfl: 3 .  traMADol (ULTRAM) 50 MG tablet, Take 1 tablet (50 mg total) by mouth every 12 (twelve) hours as needed (twice daily as needed for pain)., Disp: 60 tablet, Rfl: 0 Allergies  Allergen Reactions  . Shellfish Allergy Hives     Social History   Socioeconomic History  . Marital status: Widowed    Spouse name: Not on file  . Number of children: Not on file  . Years of education: Not on file  . Highest education level: Not on file  Social Needs  . Financial resource strain: Not on file  . Food insecurity - worry: Not on file  . Food insecurity - inability: Not on file  . Transportation needs - medical: Not on file  . Transportation needs - non-medical: Not on file  Occupational History  . Not on file  Tobacco Use  . Smoking status: Former Smoker    Packs/day: 0.50    Years: 40.00    Pack years: 20.00    Types: Cigarettes  . Smokeless tobacco: Never Used  . Tobacco comment: Quit in 2009.  Substance and Sexual Activity  . Alcohol use: No    Alcohol/week: 0.0 oz    Comment: "drank occasionally when I was young; last drink was in the late 1990s"  .  Drug use: No  . Sexual activity: No  Other Topics Concern  . Not on file  Social History Narrative   Lives alone in an independent living facility.  Education: 10th grade.     Retired from [employer] work.  Moved here from [place] in 2015.    Physical Exam  Pulmonary/Chest: No respiratory distress.  Abdominal: She exhibits no distension. There is no tenderness. There is no rebound.  Musculoskeletal: She exhibits no edema.  Skin: Skin is warm and dry. She is not diaphoretic.        Future Appointments  Date Time Provider [place]  03/10/2017  9:00 AM [facility] LAB [facility] None  03/21/2017 10:00 AM [facility] PA/NP [facility] None  04/11/2017  9:00 AM Tara Margarita, MD [facility]  05/18/2017  7:25 AM  [facility] DEVICE REMOTES [facility]    ATF [name] x4 sitting in the living room c/o her back hurting.  She stated that she took tylenol a few hours ago with no relief.  She also has some knee pain that is chronic and shes currently getting weekly injections for.  She had me look up the medication that is injected in her knee weekly so she could understand what it is.  She has taken all of the medications from her pill box besides the afternoon gabapentin and bidil.  She is having trouble remembering to take them regularlly.  Pt denies sob, chest pain and dizziness.  She hasn't fallen in a long time.  Pt still cant find the prescriptions that she picked up from the pharmacy weeks ago.  She stated that she is having trouble remembering things sometimes.  Pt has shown interest in the [facility] program and she would like to get more information about the program.  I advised her that I will go by to get her a information package.  Pt is still having trouble getting dressed in the mornings and cooking.  rx bottles verified and pill box refilled.  The pharmacy stated that pt must call her insurance company to override the prescription refill so that she can get some atorvastan.  Pt stated that she will call them later today.  BP 132/66 (BP Location: Right Arm, Patient Position: Sitting, Cuff Size: Large)   Pulse 67   Resp 16   Wt 219 lb 6.4 oz (99.5 kg)   SpO2 99%   BMI 38.86 kg/m   Weight yesterday-219 Last visit weight-219   **rx called in: bidil Colchicine Gabapentin carvedilol   Tara Dunn, EMT Paramedic 03/04/2017    ACTION: Home visit completed Next visit planned for next week

## 2017-03-04 ENCOUNTER — Other Ambulatory Visit (HOSPITAL_COMMUNITY): Payer: Self-pay | Admitting: *Deleted

## 2017-03-04 DIAGNOSIS — I5022 Chronic systolic (congestive) heart failure: Secondary | ICD-10-CM

## 2017-03-07 DIAGNOSIS — I10 Essential (primary) hypertension: Secondary | ICD-10-CM | POA: Diagnosis not present

## 2017-03-07 DIAGNOSIS — M170 Bilateral primary osteoarthritis of knee: Secondary | ICD-10-CM | POA: Diagnosis not present

## 2017-03-07 DIAGNOSIS — M1000 Idiopathic gout, unspecified site: Secondary | ICD-10-CM | POA: Diagnosis not present

## 2017-03-07 DIAGNOSIS — N19 Unspecified kidney failure: Secondary | ICD-10-CM | POA: Diagnosis not present

## 2017-03-07 DIAGNOSIS — E782 Mixed hyperlipidemia: Secondary | ICD-10-CM | POA: Diagnosis not present

## 2017-03-10 ENCOUNTER — Ambulatory Visit (HOSPITAL_COMMUNITY)
Admission: RE | Admit: 2017-03-10 | Discharge: 2017-03-10 | Disposition: A | Discharge status: Home / Self Care | Payer: PPO | Source: Ambulatory Visit | Attending: Cardiology | Admitting: Cardiology

## 2017-03-10 DIAGNOSIS — I5022 Chronic systolic (congestive) heart failure: Secondary | ICD-10-CM | POA: Insufficient documentation

## 2017-03-10 LAB — BASIC METABOLIC PANEL
ANION GAP: 9 (ref 5–15)
BUN: 61 mg/dL — ABNORMAL HIGH (ref 6–20)
CALCIUM: 10.1 mg/dL (ref 8.9–10.3)
CHLORIDE: 110 mmol/L (ref 101–111)
CO2: 18 mmol/L — ABNORMAL LOW (ref 22–32)
Creatinine, Ser: 2.56 mg/dL — ABNORMAL HIGH (ref 0.44–1.00)
GFR calc non Af Amer: 17 mL/min — ABNORMAL LOW (ref >60)
GFR, EST AFRICAN AMERICAN: 20 mL/min — AB (ref >60)
Glucose, Bld: 100 mg/dL — ABNORMAL HIGH (ref 65–99)
Potassium: 4 mmol/L (ref 3.5–5.1)
Sodium: 137 mmol/L (ref 135–145)

## 2017-03-11 ENCOUNTER — Encounter (HOSPITAL_COMMUNITY): Payer: Self-pay

## 2017-03-11 ENCOUNTER — Other Ambulatory Visit (HOSPITAL_COMMUNITY): Payer: Self-pay

## 2017-03-11 NOTE — Progress Notes (Signed)
Paramedicine Encounter    Patient ID: Tara Dunn, female    DOB: Nov 24, 1940, 77 y.o.   MRN: [id number]    Patient Care Team: Tara Bien, MD as PCP - General (Family Medicine)  Patient Active Problem List   Diagnosis Date Noted  . Joint pain 12/09/2015  . ICD (implantable cardioverter-defibrillator) in place 03/26/2015  . Syncope 03/06/2015  . Obesity (BMI 30-39.9) 01/21/2015  . Excessive daytime sleepiness 08/04/2014  . OSA (obstructive sleep apnea) 08/04/2014  . Essential hypertension 06/13/2014  . Chronic systolic CHF (congestive heart failure) ([place]) 05/20/2014  . Congestive heart disease ([place])   . CKD (chronic kidney disease), stage III ([place]) 05/03/2014  . Hx of stroke without residual deficits 05/03/2014    Current Outpatient Medications:  .  allopurinol (ZYLOPRIM) 100 MG tablet, Take 200 mg by mouth daily. , Disp: , Rfl: 0 .  aspirin EC 81 MG tablet, Take 1 tablet (81 mg total) by mouth daily., Disp: 90 tablet, Rfl: 3 .  atorvastatin (LIPITOR) 40 MG tablet, Take 1 tablet (40 mg total) by mouth daily., Disp: 30 tablet, Rfl: 0 .  carvedilol (COREG) 25 MG tablet, Take 1 tablet (25 mg total) by mouth 2 (two) times daily., Disp: 60 tablet, Rfl: 0 .  colchicine 0.6 MG tablet, Take 0.6 mg by mouth daily., Disp: , Rfl:  .  furosemide (LASIX) 20 MG tablet, Take 2 tablets (40 mg total) by mouth every other day., Disp: 30 tablet, Rfl: 0 .  gabapentin (NEURONTIN) 300 MG capsule, Take 300 mg by mouth 3 (three) times daily., Disp: , Rfl:  .  isosorbide-hydrALAZINE (BIDIL) 20-37.5 MG tablet, Take 2 tablets by mouth 3 (three) times daily., Disp: 180 tablet, Rfl: 0 .  omeprazole (PRILOSEC) 40 MG capsule, Take 40 mg by mouth daily., Disp: , Rfl: 0 .  sacubitril-valsartan (ENTRESTO) 97-103 MG, Take 1 tablet by mouth 2 (two) times daily., Disp: 60 tablet, Rfl: 0 .  spironolactone (ALDACTONE) 25 MG tablet, Take 1 tablet (25 mg total) by mouth daily., Disp: 30 tablet, Rfl: 3 .   naproxen sodium (ANAPROX) 220 MG tablet, Take 220 mg by mouth daily as needed (back pain). Reported on 08/26/2015, Disp: , Rfl:  .  traMADol (ULTRAM) 50 MG tablet, Take 1 tablet (50 mg total) by mouth every 12 (twelve) hours as needed (twice daily as needed for pain)., Disp: 60 tablet, Rfl: 0 Allergies  Allergen Reactions  . Shellfish Allergy Hives     Social History   Socioeconomic History  . Marital status: Widowed    Spouse name: Not on file  . Number of children: Not on file  . Years of education: Not on file  . Highest education level: Not on file  Social Needs  . Financial resource strain: Not on file  . Food insecurity - worry: Not on file  . Food insecurity - inability: Not on file  . Transportation needs - medical: Not on file  . Transportation needs - non-medical: Not on file  Occupational History  . Not on file  Tobacco Use  . Smoking status: Former Smoker    Packs/day: 0.50    Years: 40.00    Pack years: 20.00    Types: Cigarettes  . Smokeless tobacco: Never Used  . Tobacco comment: Quit in 2009.  Substance and Sexual Activity  . Alcohol use: No    Alcohol/week: 0.0 oz    Comment: "drank occasionally when I was young; last drink was in the late 1990s"  .  Drug use: No  . Sexual activity: No  Other Topics Concern  . Not on file  Social History Narrative   Lives alone in an independent living facility.  Education: 10th grade.     Retired from [employer] work.  Moved here from [place] in 2015.    Physical Exam  Pulmonary/Chest: No respiratory distress.  Abdominal: She exhibits no distension. There is no tenderness.  Musculoskeletal: She exhibits no edema.  Skin: Skin is warm and dry. She is not diaphoretic.        Future Appointments  Date Time Provider [place]  03/21/2017 10:00 AM [facility] PA/NP [facility] None  04/11/2017  9:00 AM Tara Margarita, MD [facility]  05/18/2017  7:25 AM [facility] DEVICE REMOTES [facility]    ATF  [name] x4 sitting in the living room c/o knee and back pain.  Pt has hx of chronic knee and back pain.  She stated that her pcp told her to get a knee/back brace but did not give her a prescription for it.  Pt called her insurance company whom told her where she could get the brace from.  Pt has taken all of her meds besides the afternoon medications.  Pt denies sob, chest pain and dizziness.  Pt hasn't fallen recently and she tries to watch what she eats.  rx bottles verified and pill box refilled.    [facility]/callef in to dr dewy office for prescription.  BP 124/80 (BP Location: Right Arm, Patient Position: Sitting, Cuff Size: Large)   Pulse 84   Resp 16   Wt 221 lb 3.2 oz (100.3 kg)   SpO2 98%   BMI 39.18 kg/m    Tara Dunn, EMT Paramedic 03/11/2017    ACTION: Home visit completed

## 2017-03-15 ENCOUNTER — Telehealth (HOSPITAL_COMMUNITY): Payer: Self-pay | Admitting: Cardiology

## 2017-03-15 DIAGNOSIS — I5022 Chronic systolic (congestive) heart failure: Secondary | ICD-10-CM

## 2017-03-15 MED ORDER — FUROSEMIDE 20 MG PO TABS: 40.0000 mg | ORAL_TABLET | Freq: Every day | ORAL | 1 refills | Status: DC

## 2017-03-15 NOTE — Telephone Encounter (Signed)
Patient aware. Patient voiced understanding. Reports she takes lasix everyday, will hold for 3 days and repeat bmet on 2/8

## 2017-03-15 NOTE — Telephone Encounter (Signed)
-----   Message from Shirley Friar, PA-C sent at 03/10/2017  2:41 PM EST ----- AKI noted. Unclear why she had a BMET drawn today.   Please ask how she is taking her lasix.  Needs to hold at least one dose if taking every other day. If taking daily, needs to hold for 3 days. Needs repeat BMET Monday.    Legrand Como [address] [place], PA-C 03/10/2017 2:40 PM

## 2017-03-16 ENCOUNTER — Emergency Department (HOSPITAL_COMMUNITY)
Admission: EM | Admit: 2017-03-16 | Discharge: 2017-03-16 | Disposition: A | Discharge status: Home / Self Care | Payer: PPO | Attending: Emergency Medicine | Admitting: Emergency Medicine

## 2017-03-16 ENCOUNTER — Encounter (HOSPITAL_COMMUNITY): Payer: Self-pay

## 2017-03-16 ENCOUNTER — Other Ambulatory Visit (HOSPITAL_COMMUNITY): Payer: Self-pay

## 2017-03-16 ENCOUNTER — Emergency Department (HOSPITAL_COMMUNITY): Payer: PPO

## 2017-03-16 ENCOUNTER — Telehealth (HOSPITAL_COMMUNITY): Payer: Self-pay | Admitting: *Deleted

## 2017-03-16 DIAGNOSIS — Z95810 Presence of automatic (implantable) cardiac defibrillator: Secondary | ICD-10-CM | POA: Diagnosis not present

## 2017-03-16 DIAGNOSIS — R079 Chest pain, unspecified: Secondary | ICD-10-CM | POA: Diagnosis not present

## 2017-03-16 DIAGNOSIS — I130 Hypertensive heart and chronic kidney disease with heart failure and stage 1 through stage 4 chronic kidney disease, or unspecified chronic kidney disease: Secondary | ICD-10-CM | POA: Diagnosis not present

## 2017-03-16 DIAGNOSIS — E861 Hypovolemia: Secondary | ICD-10-CM | POA: Insufficient documentation

## 2017-03-16 DIAGNOSIS — N183 Chronic kidney disease, stage 3 (moderate): Secondary | ICD-10-CM | POA: Diagnosis not present

## 2017-03-16 DIAGNOSIS — Z853 Personal history of malignant neoplasm of breast: Secondary | ICD-10-CM | POA: Diagnosis not present

## 2017-03-16 DIAGNOSIS — R0602 Shortness of breath: Secondary | ICD-10-CM | POA: Diagnosis not present

## 2017-03-16 DIAGNOSIS — I5022 Chronic systolic (congestive) heart failure: Secondary | ICD-10-CM | POA: Insufficient documentation

## 2017-03-16 DIAGNOSIS — Z87891 Personal history of nicotine dependence: Secondary | ICD-10-CM | POA: Insufficient documentation

## 2017-03-16 DIAGNOSIS — Z7982 Long term (current) use of aspirin: Secondary | ICD-10-CM | POA: Insufficient documentation

## 2017-03-16 DIAGNOSIS — Z79899 Other long term (current) drug therapy: Secondary | ICD-10-CM | POA: Insufficient documentation

## 2017-03-16 DIAGNOSIS — I959 Hypotension, unspecified: Secondary | ICD-10-CM | POA: Diagnosis not present

## 2017-03-16 DIAGNOSIS — I9589 Other hypotension: Secondary | ICD-10-CM | POA: Diagnosis not present

## 2017-03-16 LAB — COMPREHENSIVE METABOLIC PANEL
ALT: 24 U/L (ref 14–54)
ANION GAP: 15 (ref 5–15)
AST: 46 U/L — ABNORMAL HIGH (ref 15–41)
Albumin: 3.8 g/dL (ref 3.5–5.0)
Alkaline Phosphatase: 68 U/L (ref 38–126)
BUN: 63 mg/dL — ABNORMAL HIGH (ref 6–20)
CALCIUM: 10 mg/dL (ref 8.9–10.3)
CHLORIDE: 104 mmol/L (ref 101–111)
CO2: 18 mmol/L — ABNORMAL LOW (ref 22–32)
CREATININE: 2.5 mg/dL — AB (ref 0.44–1.00)
GFR, EST AFRICAN AMERICAN: 20 mL/min — AB (ref >60)
GFR, EST NON AFRICAN AMERICAN: 17 mL/min — AB (ref >60)
Glucose, Bld: 104 mg/dL — ABNORMAL HIGH (ref 65–99)
Potassium: 4.2 mmol/L (ref 3.5–5.1)
Sodium: 137 mmol/L (ref 135–145)
Total Bilirubin: 0.6 mg/dL (ref 0.3–1.2)
Total Protein: 6.3 g/dL — ABNORMAL LOW (ref 6.5–8.1)

## 2017-03-16 LAB — URINALYSIS, ROUTINE W REFLEX MICROSCOPIC
Bilirubin Urine: NEGATIVE
Glucose, UA: NEGATIVE mg/dL
Hgb urine dipstick: NEGATIVE
KETONES UR: NEGATIVE mg/dL
LEUKOCYTES UA: NEGATIVE
Nitrite: NEGATIVE
PROTEIN: NEGATIVE mg/dL
Specific Gravity, Urine: 1.011 (ref 1.005–1.030)
pH: 5 (ref 5.0–8.0)

## 2017-03-16 LAB — CBC WITH DIFFERENTIAL/PLATELET
BASOS PCT: 1 %
Basophils Absolute: 0 10*3/uL (ref 0.0–0.1)
EOS ABS: 0.1 10*3/uL (ref 0.0–0.7)
Eosinophils Relative: 3 %
HCT: 36.5 % (ref 36.0–46.0)
HEMOGLOBIN: 11.8 g/dL — AB (ref 12.0–15.0)
Lymphocytes Relative: 36 %
Lymphs Abs: 1.3 10*3/uL (ref 0.7–4.0)
MCH: 29.2 pg (ref 26.0–34.0)
MCHC: 32.3 g/dL (ref 30.0–36.0)
MCV: 90.3 fL (ref 78.0–100.0)
Monocytes Absolute: 0.4 10*3/uL (ref 0.1–1.0)
Monocytes Relative: 11 %
NEUTROS PCT: 51 %
Neutro Abs: 1.8 10*3/uL (ref 1.7–7.7)
Platelets: 137 10*3/uL — ABNORMAL LOW (ref 150–400)
RBC: 4.04 MIL/uL (ref 3.87–5.11)
RDW: 16.4 % — ABNORMAL HIGH (ref 11.5–15.5)
WBC: 3.5 10*3/uL — AB (ref 4.0–10.5)

## 2017-03-16 LAB — TROPONIN I

## 2017-03-16 LAB — BRAIN NATRIURETIC PEPTIDE: B NATRIURETIC PEPTIDE 5: 9.3 pg/mL (ref 0.0–100.0)

## 2017-03-16 MED ORDER — SODIUM CHLORIDE 0.9 % IV BOLUS (SEPSIS): 500.0000 mL | Freq: Once | INTRAVENOUS | Status: DC

## 2017-03-16 NOTE — ED Notes (Signed)
Pt states she still makes urine. Pt notified to let staff know when/if she needs to urinate to provide UA sample.

## 2017-03-16 NOTE — Telephone Encounter (Signed)
Dee called to report pts low bp reading 84/60, blurred vision, and slight headache. Patient was advised yesterday to hold her lasix x 3days. Her creatinine was elevated. Per Jonni Sanger continue to hold lasix he feels she is dehydrated and to keep follow up and lab appt. Also advised to report to ED if she feels worse or bp drops.

## 2017-03-16 NOTE — Discharge Instructions (Signed)
Do not take lasix tonight or tomorrow.

## 2017-03-16 NOTE — ED Notes (Signed)
Ambulated pt down hallway with walker, pt had one brief instance of 94%, but maintained 99-100% during whole trip.  Pt. Had "no dizziness, no sob, no other problems "

## 2017-03-16 NOTE — ED Provider Notes (Signed)
[facility] EMERGENCY DEPARTMENT Provider Note   CSN: [id number] Arrival date & time: 03/16/17  1739     History   Chief Complaint Chief Complaint  Patient presents with  . Shortness of Breath    HPI Tara Dunn is a 77 y.o. female.  Pt presents to the ED today with DOE and weakness.  Pt is part of the [facility] CHF clinic and was seen yesterday.  She had low bp and was told to hold her lasix for 2 days.  She did not take her lasix last night.  She feels sob with movement today and weak.  She does c/o some left sided chest wall pain.      Past Medical History:  Diagnosis Date  . 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 Lovena Le  . Aortic dilatation ([place])    a. 04/2014 CTA chest w/ incidental finding of distal thoracic Ao enlargement of 3.39 mm - f/u needed 04/2015.  . Arthritis    "all over my body"  . Cancer of left breast ([place]) 2009   s/p L mastectomy and chemo.  . Cardiomyopathy ([place])    a. 04/2014 Echo: EF 25-30%, mod conc LVH, possibl antsept HK, Gr1 DD, mod-sev dil LA.  . CHF (congestive heart failure) ([place])   . CKD (chronic kidney disease), stage III ([place])   . Depression   . GERD (gastroesophageal reflux disease)   . Gout   . Heart murmur   . History of stomach ulcers   . Hypertension    a. Dx @ age 74;  b. 04/2014 admission for HTN emergency.  . Obesity (BMI 30-39.9) 01/21/2015  . OSA on CPAP   . Stroke [facility])    a. 3 strokes - last in 1998; "memory problems since"  (03/26/2015)    Patient Active Problem List   Diagnosis Date Noted  . Joint pain 12/09/2015  . ICD (implantable cardioverter-defibrillator) in place 03/26/2015  . Syncope 03/06/2015  . Obesity (BMI 30-39.9) 01/21/2015  . Excessive daytime sleepiness 08/04/2014  . OSA (obstructive sleep apnea) 08/04/2014  . Essential hypertension 06/13/2014  . Chronic systolic CHF (congestive heart failure) ([place]) 05/20/2014  . Congestive heart disease ([place])     . CKD (chronic kidney disease), stage III ([place]) 05/03/2014  . Hx of stroke without residual deficits 05/03/2014    Past Surgical History:  Procedure Laterality Date  . ABDOMINAL HYSTERECTOMY    . BREAST BIOPSY Left 2008  . CATARACT EXTRACTION, BILATERAL Bilateral   . DILATION AND CURETTAGE OF UTERUS    . EP IMPLANTABLE DEVICE N/A 03/26/2015   MDT single chamber ICD, Dr. Lovena Le  . LEFT HEART CATHETERIZATION WITH CORONARY ANGIOGRAM N/A 05/07/2014   Procedure: LEFT HEART CATHETERIZATION WITH CORONARY ANGIOGRAM;  Surgeon: Belva Crome, MD;  Location: [facility] CATH LAB;  Service: Cardiovascular;  Laterality: N/A;  . MASTECTOMY Left 2009    OB History    No data available       Home Medications    Prior to Admission medications   Medication Sig Start Date End Date Taking? Authorizing Provider  allopurinol (ZYLOPRIM) 100 MG tablet Take 200 mg by mouth daily.  03/28/14   [provider]  aspirin EC 81 MG tablet Take 1 tablet (81 mg total) by mouth daily. 05/20/14   Larey Dresser, MD  atorvastatin (LIPITOR) 40 MG tablet Take 1 tablet (40 mg total) by mouth daily. 10/26/16   Larey Dresser, MD  carvedilol (COREG) 25 MG  tablet Take 1 tablet (25 mg total) by mouth 2 (two) times daily. 10/26/16   Larey Dresser, MD  colchicine 0.6 MG tablet Take 0.6 mg by mouth daily.    [provider]  furosemide (LASIX) 20 MG tablet Take 2 tablets (40 mg total) by mouth daily. 03/15/17   Shirley Friar, PA-C  gabapentin (NEURONTIN) 300 MG capsule Take 300 mg by mouth 3 (three) times daily.    [provider]  isosorbide-hydrALAZINE (BIDIL) 20-37.5 MG tablet Take 2 tablets by mouth 3 (three) times daily. 10/26/16   Larey Dresser, MD  naproxen sodium (ANAPROX) 220 MG tablet Take 220 mg by mouth daily as needed (back pain). Reported on 08/26/2015    [provider]  omeprazole (PRILOSEC) 40 MG capsule Take 40 mg by mouth daily. 03/28/14   [provider]   sacubitril-valsartan (ENTRESTO) 97-103 MG Take 1 tablet by mouth 2 (two) times daily. 10/26/16   Larey Dresser, MD  spironolactone (ALDACTONE) 25 MG tablet Take 1 tablet (25 mg total) by mouth daily. 02/10/17   Larey Dresser, MD  traMADol (ULTRAM) 50 MG tablet Take 1 tablet (50 mg total) by mouth every 12 (twelve) hours as needed (twice daily as needed for pain). 07/28/15   Larey Dresser, MD    Family History Family History  Problem Relation Age of Onset  . High blood pressure Mother        Died @ 64.  . High blood pressure Father   . Heart attack Father        Died in his early 31's.  . High blood pressure Sister   . Cancer Sister   . Cancer Daughter   . Heart disease Daughter     Social History Social History   Tobacco Use  . Smoking status: Former Smoker    Packs/day: 0.50    Years: 40.00    Pack years: 20.00    Types: Cigarettes  . Smokeless tobacco: Never Used  . Tobacco comment: Quit in 2009.  Substance Use Topics  . Alcohol use: No    Alcohol/week: 0.0 oz    Comment: "drank occasionally when I was young; last drink was in the late 1990s"  . Drug use: No     Allergies   Shellfish allergy   Review of Systems Review of Systems  Respiratory: Positive for shortness of breath.   Cardiovascular: Positive for chest pain.  All other systems reviewed and are negative.    Physical Exam Updated Vital Signs BP 112/72 (BP Location: Right Arm)   Pulse 85   Temp 98 F (36.7 C) (Oral)   Resp 19   SpO2 100%   Physical Exam  Constitutional: She is oriented to person, place, and time. She appears well-developed and well-nourished.  HENT:  Head: Normocephalic and atraumatic.  Mouth/Throat: Oropharynx is clear and moist.  Eyes: EOM are normal. Pupils are equal, round, and reactive to light.  Neck: Normal range of motion. Neck supple.  Cardiovascular: Normal rate, regular rhythm, normal heart sounds and intact distal pulses.  Pulmonary/Chest: Effort normal  and breath sounds normal.    Abdominal: Soft. Bowel sounds are normal.  Musculoskeletal: Normal range of motion.       Right lower leg: She exhibits edema.       Left lower leg: She exhibits edema.  Neurological: She is alert and oriented to person, place, and time.  Skin: Skin is warm and dry. Capillary refill takes less than 2  seconds.  Psychiatric: She has a normal mood and affect. Her behavior is normal.  Nursing note and vitals reviewed.    ED Treatments / Results  Labs (all labs ordered are listed, but only abnormal results are displayed) Labs Reviewed  COMPREHENSIVE METABOLIC PANEL - Abnormal; Notable for the following components:      Result Value   CO2 18 (*)    Glucose, Bld 104 (*)    BUN 63 (*)    Creatinine, Ser 2.50 (*)    Total Protein 6.3 (*)    AST 46 (*)    GFR calc non Af Amer 17 (*)    GFR calc Af Amer 20 (*)    All other components within normal limits  CBC WITH DIFFERENTIAL/PLATELET - Abnormal; Notable for the following components:   WBC 3.5 (*)    Hemoglobin 11.8 (*)    RDW 16.4 (*)    Platelets 137 (*)    All other components within normal limits  TROPONIN I  BRAIN NATRIURETIC PEPTIDE  URINALYSIS, ROUTINE W REFLEX MICROSCOPIC    EKG  EKG Interpretation  Date/Time:  Wednesday March 16 2017 18:08:01 EST Ventricular Rate:  76 PR Interval:    QRS Duration: 142 QT Interval:  395 QTC Calculation: 445 R Axis:   -44 Text Interpretation:  Sinus rhythm Probable left atrial enlargement Left bundle branch block No acute changes Confirmed by Isla Pence [phone]) on 03/16/2017 6:14:14 PM       Radiology Dg Chest 2 View  Result Date: 03/16/2017 CLINICAL DATA:  Short-of-breath. EXAM: CHEST  2 VIEW COMPARISON:  March 27, 2015 FINDINGS: Stable AICD device. The heart, hila, and mediastinum are unchanged with a tortuous thoracic aorta. No nodules or masses. No focal infiltrates. No overt edema. IMPRESSION: No active cardiopulmonary disease.  Electronically Signed   By: Dorise Bullion III M.D   On: 03/16/2017 18:57    Procedures Procedures (including critical care time)  Medications Ordered in ED Medications  sodium chloride 0.9 % bolus 500 mL (not administered)     Initial Impression / Assessment and Plan / ED Course  I have reviewed the triage vital signs and the nursing notes.  Pertinent labs & imaging results that were available during my care of the patient were reviewed by me and considered in my medical decision making (see chart for details).     BP has come up.  Pt able to ambulate without any problems.  She feels much better with nl bp.  She is told to hold lasix tonight and tomorrow.  She is instructed to return if worse and f/u with pcp.  Final Clinical Impressions(s) / ED Diagnoses   Final diagnoses:  Hypotension due to hypovolemia    ED Discharge Orders    None       Isla Pence, MD 03/16/17 2135

## 2017-03-16 NOTE — Progress Notes (Signed)
Paramedicine Encounter    Patient ID: Tara Dunn, female    DOB: [birth year], 77 y.o.   MRN: [id number]    Patient Care Team: Fanny Bien, MD as PCP - General (Family Medicine)  Patient Active Problem List   Diagnosis Date Noted  . Joint pain 12/09/2015  . ICD (implantable cardioverter-defibrillator) in place 03/26/2015  . Syncope 03/06/2015  . Obesity (BMI 30-39.9) 01/21/2015  . Excessive daytime sleepiness 08/04/2014  . OSA (obstructive sleep apnea) 08/04/2014  . Essential hypertension 06/13/2014  . Chronic systolic CHF (congestive heart failure) ([place]) 05/20/2014  . Congestive heart disease ([place])   . CKD (chronic kidney disease), stage III ([place]) 05/03/2014  . Hx of stroke without residual deficits 05/03/2014    Current Outpatient Medications:  .  allopurinol (ZYLOPRIM) 100 MG tablet, Take 200 mg by mouth daily. , Disp: , Rfl: 0 .  aspirin EC 81 MG tablet, Take 1 tablet (81 mg total) by mouth daily., Disp: 90 tablet, Rfl: 3 .  atorvastatin (LIPITOR) 40 MG tablet, Take 1 tablet (40 mg total) by mouth daily., Disp: 30 tablet, Rfl: 0 .  carvedilol (COREG) 25 MG tablet, Take 1 tablet (25 mg total) by mouth 2 (two) times daily., Disp: 60 tablet, Rfl: 0 .  colchicine 0.6 MG tablet, Take 0.6 mg by mouth daily., Disp: , Rfl:  .  furosemide (LASIX) 20 MG tablet, Take 2 tablets (40 mg total) by mouth daily., Disp: 60 tablet, Rfl: 1 .  gabapentin (NEURONTIN) 300 MG capsule, Take 300 mg by mouth 3 (three) times daily., Disp: , Rfl:  .  isosorbide-hydrALAZINE (BIDIL) 20-37.5 MG tablet, Take 2 tablets by mouth 3 (three) times daily., Disp: 180 tablet, Rfl: 0 .  omeprazole (PRILOSEC) 40 MG capsule, Take 40 mg by mouth daily., Disp: , Rfl: 0 .  sacubitril-valsartan (ENTRESTO) 97-103 MG, Take 1 tablet by mouth 2 (two) times daily., Disp: 60 tablet, Rfl: 0 .  spironolactone (ALDACTONE) 25 MG tablet, Take 1 tablet (25 mg total) by mouth daily., Disp: 30 tablet, Rfl: 3 .  naproxen sodium  (ANAPROX) 220 MG tablet, Take 220 mg by mouth daily as needed (back pain). Reported on 08/26/2015, Disp: , Rfl:  .  traMADol (ULTRAM) 50 MG tablet, Take 1 tablet (50 mg total) by mouth every 12 (twelve) hours as needed (twice daily as needed for pain)., Disp: 60 tablet, Rfl: 0 Allergies  Allergen Reactions  . Shellfish Allergy Hives     Social History   Socioeconomic History  . Marital status: Widowed    Spouse name: Not on file  . Number of children: Not on file  . Years of education: Not on file  . Highest education level: Not on file  Social Needs  . Financial resource strain: Not on file  . Food insecurity - worry: Not on file  . Food insecurity - inability: Not on file  . Transportation needs - medical: Not on file  . Transportation needs - non-medical: Not on file  Occupational History  . Not on file  Tobacco Use  . Smoking status: Former Smoker    Packs/day: 0.50    Years: 40.00    Pack years: 20.00    Types: Cigarettes  . Smokeless tobacco: Never Used  . Tobacco comment: Quit in 2009.  Substance and Sexual Activity  . Alcohol use: No    Alcohol/week: 0.0 oz    Comment: "drank occasionally when I was young; last drink was in the late 1990s"  . Drug use:  No  . Sexual activity: No  Other Topics Concern  . Not on file  Social History Narrative   Lives alone in an independent living facility.  Education: 10th grade.     Retired from [employer] work.  Moved here from [place] in 2015.    Physical Exam  Pulmonary/Chest: No respiratory distress. She has no wheezes.  Abdominal: She exhibits no distension.  Musculoskeletal: She exhibits no edema.  Skin: Skin is warm and dry. She is not diaphoretic.        Future Appointments  Date Time Provider [place]  03/18/2017  9:45 AM [facility] LAB [facility] None  03/21/2017 10:00 AM [facility] PA/NP [facility] None  04/11/2017  9:00 AM Sueanne Margarita, MD [facility]  05/18/2017  7:25 AM [facility] DEVICE REMOTES  [facility]    ATF [name] x4 walking around her apartment slowly, she's c/o of back pain. She stated that the pain in her back is severe and she's having a very hard time walking around today.  Pt went to get the knee injections this week but the physician was out of the office.  She also has left foot pain that she think is due to gout.  She stated that she just feels real bad today and she doesn't have much energy.  She also has blurred vision since this am. She took her morning meds this am around 8am.  She ate toast and oatmeal for breakfast this am.  Pt is hypotensive at this time witho no orthostatic changes when she stood up.  She also denies nausea but she does have a "slight headache".  Pt's rx bottles verified and pill box refilled.  The heart failure clinic was called due to her bp and blurred vision.  Jonni Sanger advised pt to continue to hold the lasix for the 3 days that he originally told her and if her symptoms progressed she can call the clinic or 911.  Pt agreed.   BP (!) 88/70 (BP Location: Right Arm, Patient Position: Standing, Cuff Size: Large)   Pulse 66   Resp 12   Wt 219 lb (99.3 kg)   SpO2 98%   BMI 38.79 kg/m   Weight yesterday-didn't weigh Last visit weight-221    Greenleigh Kauth, EMT Paramedic 03/16/2017    ACTION: Home visit completed Next visit planned for 2/11 at the clinic

## 2017-03-16 NOTE — ED Triage Notes (Addendum)
Pt to ED via [facility] from the [facility] assisted living [facility]-- pt is in [facility] CHF clinic-- was seen yesterday -- and had lowpressure-- told to hold lasix for 2 days-- did not take lasix last night-- became short of breath today -- pt does not have pedal edema, does c/o chest pain left sided under left breast--  Left arm restricted due to breast cancer.

## 2017-03-16 NOTE — ED Notes (Addendum)
Patient transported to x-ray. ?

## 2017-03-17 ENCOUNTER — Other Ambulatory Visit: Payer: Self-pay | Admitting: Internal Medicine

## 2017-03-17 ENCOUNTER — Other Ambulatory Visit (HOSPITAL_COMMUNITY): Payer: Self-pay | Admitting: Internal Medicine

## 2017-03-18 ENCOUNTER — Ambulatory Visit (HOSPITAL_COMMUNITY)
Admission: RE | Admit: 2017-03-18 | Discharge: 2017-03-18 | Disposition: A | Discharge status: Home / Self Care | Payer: PPO | Source: Ambulatory Visit | Attending: Cardiology | Admitting: Cardiology

## 2017-03-18 DIAGNOSIS — Z6837 Body mass index (BMI) 37.0-37.9, adult: Secondary | ICD-10-CM | POA: Diagnosis not present

## 2017-03-18 DIAGNOSIS — I5022 Chronic systolic (congestive) heart failure: Secondary | ICD-10-CM | POA: Insufficient documentation

## 2017-03-18 DIAGNOSIS — R0602 Shortness of breath: Secondary | ICD-10-CM | POA: Diagnosis not present

## 2017-03-18 DIAGNOSIS — I959 Hypotension, unspecified: Secondary | ICD-10-CM | POA: Diagnosis not present

## 2017-03-18 LAB — BASIC METABOLIC PANEL
ANION GAP: 11 (ref 5–15)
BUN: 63 mg/dL — ABNORMAL HIGH (ref 6–20)
CHLORIDE: 107 mmol/L (ref 101–111)
CO2: 20 mmol/L — AB (ref 22–32)
CREATININE: 2.24 mg/dL — AB (ref 0.44–1.00)
Calcium: 9.9 mg/dL (ref 8.9–10.3)
GFR calc non Af Amer: 20 mL/min — ABNORMAL LOW (ref >60)
GFR, EST AFRICAN AMERICAN: 23 mL/min — AB (ref >60)
Glucose, Bld: 92 mg/dL (ref 65–99)
Potassium: 4.2 mmol/L (ref 3.5–5.1)
SODIUM: 138 mmol/L (ref 135–145)

## 2017-03-21 ENCOUNTER — Ambulatory Visit
Admission: RE | Admit: 2017-03-21 | Discharge: 2017-03-21 | Disposition: A | Discharge status: Home / Self Care | Payer: PPO | Source: Ambulatory Visit | Attending: Family Medicine | Admitting: Family Medicine

## 2017-03-21 ENCOUNTER — Ambulatory Visit (HOSPITAL_COMMUNITY)
Admission: RE | Admit: 2017-03-21 | Discharge: 2017-03-21 | Disposition: A | Discharge status: Home / Self Care | Payer: PPO | Source: Ambulatory Visit | Attending: Internal Medicine | Admitting: Internal Medicine

## 2017-03-21 ENCOUNTER — Encounter (HOSPITAL_COMMUNITY): Payer: Self-pay

## 2017-03-21 ENCOUNTER — Other Ambulatory Visit: Payer: Self-pay | Admitting: Family Medicine

## 2017-03-21 VITALS — BP 118/84 | HR 82 | Wt 221.4 lb

## 2017-03-21 DIAGNOSIS — Z95810 Presence of automatic (implantable) cardiac defibrillator: Secondary | ICD-10-CM | POA: Diagnosis not present

## 2017-03-21 DIAGNOSIS — I429 Cardiomyopathy, unspecified: Secondary | ICD-10-CM | POA: Diagnosis not present

## 2017-03-21 DIAGNOSIS — Z853 Personal history of malignant neoplasm of breast: Secondary | ICD-10-CM | POA: Diagnosis not present

## 2017-03-21 DIAGNOSIS — Z6839 Body mass index (BMI) 39.0-39.9, adult: Secondary | ICD-10-CM | POA: Diagnosis not present

## 2017-03-21 DIAGNOSIS — M170 Bilateral primary osteoarthritis of knee: Secondary | ICD-10-CM | POA: Diagnosis not present

## 2017-03-21 DIAGNOSIS — W19XXXA Unspecified fall, initial encounter: Secondary | ICD-10-CM

## 2017-03-21 DIAGNOSIS — Z9012 Acquired absence of left breast and nipple: Secondary | ICD-10-CM | POA: Diagnosis not present

## 2017-03-21 DIAGNOSIS — Z90710 Acquired absence of both cervix and uterus: Secondary | ICD-10-CM | POA: Insufficient documentation

## 2017-03-21 DIAGNOSIS — M109 Gout, unspecified: Secondary | ICD-10-CM | POA: Insufficient documentation

## 2017-03-21 DIAGNOSIS — Z7982 Long term (current) use of aspirin: Secondary | ICD-10-CM | POA: Insufficient documentation

## 2017-03-21 DIAGNOSIS — X501XXA Overexertion from prolonged static or awkward postures, initial encounter: Secondary | ICD-10-CM

## 2017-03-21 DIAGNOSIS — N183 Chronic kidney disease, stage 3 unspecified: Secondary | ICD-10-CM

## 2017-03-21 DIAGNOSIS — I5022 Chronic systolic (congestive) heart failure: Secondary | ICD-10-CM | POA: Diagnosis not present

## 2017-03-21 DIAGNOSIS — Z8673 Personal history of transient ischemic attack (TIA), and cerebral infarction without residual deficits: Secondary | ICD-10-CM | POA: Diagnosis not present

## 2017-03-21 DIAGNOSIS — M79671 Pain in right foot: Secondary | ICD-10-CM | POA: Diagnosis not present

## 2017-03-21 DIAGNOSIS — I2510 Atherosclerotic heart disease of native coronary artery without angina pectoris: Secondary | ICD-10-CM | POA: Insufficient documentation

## 2017-03-21 DIAGNOSIS — Z79899 Other long term (current) drug therapy: Secondary | ICD-10-CM | POA: Insufficient documentation

## 2017-03-21 DIAGNOSIS — S99911A Unspecified injury of right ankle, initial encounter: Secondary | ICD-10-CM | POA: Diagnosis not present

## 2017-03-21 DIAGNOSIS — Z9889 Other specified postprocedural states: Secondary | ICD-10-CM | POA: Insufficient documentation

## 2017-03-21 DIAGNOSIS — E669 Obesity, unspecified: Secondary | ICD-10-CM | POA: Diagnosis not present

## 2017-03-21 DIAGNOSIS — G4733 Obstructive sleep apnea (adult) (pediatric): Secondary | ICD-10-CM | POA: Diagnosis not present

## 2017-03-21 DIAGNOSIS — I130 Hypertensive heart and chronic kidney disease with heart failure and stage 1 through stage 4 chronic kidney disease, or unspecified chronic kidney disease: Secondary | ICD-10-CM | POA: Diagnosis not present

## 2017-03-21 DIAGNOSIS — N19 Unspecified kidney failure: Secondary | ICD-10-CM | POA: Diagnosis not present

## 2017-03-21 DIAGNOSIS — S8991XA Unspecified injury of right lower leg, initial encounter: Secondary | ICD-10-CM | POA: Diagnosis not present

## 2017-03-21 DIAGNOSIS — Z9221 Personal history of antineoplastic chemotherapy: Secondary | ICD-10-CM | POA: Insufficient documentation

## 2017-03-21 DIAGNOSIS — I509 Heart failure, unspecified: Secondary | ICD-10-CM | POA: Diagnosis not present

## 2017-03-21 DIAGNOSIS — Z8249 Family history of ischemic heart disease and other diseases of the circulatory system: Secondary | ICD-10-CM | POA: Diagnosis not present

## 2017-03-21 DIAGNOSIS — Z87891 Personal history of nicotine dependence: Secondary | ICD-10-CM | POA: Diagnosis not present

## 2017-03-21 DIAGNOSIS — I10 Essential (primary) hypertension: Secondary | ICD-10-CM | POA: Diagnosis not present

## 2017-03-21 LAB — BASIC METABOLIC PANEL
Anion gap: 11 (ref 5–15)
BUN: 59 mg/dL — ABNORMAL HIGH (ref 6–20)
CALCIUM: 10.3 mg/dL (ref 8.9–10.3)
CHLORIDE: 106 mmol/L (ref 101–111)
CO2: 22 mmol/L (ref 22–32)
CREATININE: 2.14 mg/dL — AB (ref 0.44–1.00)
GFR calc Af Amer: 24 mL/min — ABNORMAL LOW (ref >60)
GFR calc non Af Amer: 21 mL/min — ABNORMAL LOW (ref >60)
GLUCOSE: 93 mg/dL (ref 65–99)
Potassium: 4.6 mmol/L (ref 3.5–5.1)
Sodium: 139 mmol/L (ref 135–145)

## 2017-03-21 MED ORDER — ISOSORB DINITRATE-HYDRALAZINE 20-37.5 MG PO TABS: 2.0000 | ORAL_TABLET | Freq: Two times a day (BID) | ORAL | 11 refills | Status: DC

## 2017-03-21 NOTE — Progress Notes (Signed)
Patient ID: Tara Dunn, female   DOB: [birth year], 77 y.o.   MRN: [id number]     Advanced Heart Failure Clinic Note   PCP: Dr. Ernie Hew HF: Dr. Aundra Dubin   MS Yoshimura is a 77 yo with long history of HTN, CVAs, CKD, and systolic CHF.    In 3/16, she was admitted a hypertensive emergency and acute CHF.  CTA chest showed no PE.  Echo showed EF 25-30% with moderate LVH.  She was diuresed and had a cardiac catheterization given extensive coronary calcification seen on CTA chest.  Cath showed diffuse moderate CAD, worst was 50-70% mRCA stenosis.   Today she returns for 3 month follow up.Overall feeling terrible. Last Wednesday she was seen in the ED for dizziness, sob, and hypotension. Says she had diarrhea earlier that week and was unable to eat much. Felt to be dry so diuretics held for 3 days. She restarted lasix on 2/8. Yesterday she had a mechanical fall and called EMS.  EMS arrived and helped her off the floor. Complaining of leg pain.  SOB with moderate exertion but this is her baseline. Denies PND/Orthopnea. Denies dizziness. Sleeps on 2 pillows. Limited mobility due to knee pain. Has not been using CPAP because it is broken. No fever or chills. Weight at home 220 pounds. Misses afternoon bidil doses daily. Followed Paramedicine.  Lives alone at [facility].           Optivol:  Fluid well below threshold. No VT/Afib. Activity< 1 hour per day.  Labs (3/16): K 4.2, creatinine 1.44, HCT 39.7 Labs (06/05/14): K 4.1, creatinine 2.01 Labs (5/16): K 4.7, creatinine 1.56 Labs (7/16): K 4.7, creatinine 1.68, BNP 14, LDL 62 Labs (11/16): K 4.2, creatinine 1.93 Labs (1/17): K 4.6, creatinine 1.65, HCT 39.2 Labs (4/17): K 4.5, creatinine 1.98 Labs (6/17): K 4, creatinine 2 Labs (8/17): K 4.4, creatinine 1.64   Labs (10/17): K 3.9, creatinine 1.5, BNP 15 Labs (1/18): K 4.3, creatinine 1.59 Labs (4/18): LDL 46, HDL 57 Labs (5/18): K 4.6, creatinine 1.55 Labs (10/08/2016): K 4 Creatinine 1.9    Labs (2/82019): K 4.2 Creatinine 2.24   PMH: 1. CVA: 3 prior CVAs, last in 1998.  Thought to be related to HTN. 2. HTN: Long-standing, says she has been hypertensive since age 15. Renal artery dopplers (4/16) were normal.  3. CKD: Suspect related to HTN. 4. Breast cancer: 2009, left mastectomy with chemotherapy.  5. Gout 6. Obese 7. TAH 8. CAD: LHC (3/16) with moderate diffuse disease, worst lesion being 50-70% mid RCA.  9. Cardiomyopathy: Echo (3/16) with EF 25-30%, moderate LVH.  Echo (6/16) with EF 35-40%, moderate LVH, diffuse hypokinesis with septal-lateral dyssynchrony.  Cardiac MRI (12/16) with EF 32%, diffuse hypokinesis, no contrast given due to CKD.  - Medtronic ICD 2/17.  - Echo (5/18): EF 40%, diffuse hypokinesis, moderate LVH, normal RV size and systolic function.  10. OSA: Using CPAP.  11. Event monitor (1/17) with NSR, occasional PVCs.   SH: From [place], moved to [place] in 2015 to be near daughter.  Single.  Quit smoking 2009.    FH: HTN runs in family.  Father with MI.   ROS: All systems reviewed and negative except as per HPI.   Current Outpatient Medications  Medication Sig Dispense Refill  . allopurinol (ZYLOPRIM) 100 MG tablet Take 200 mg by mouth daily.   0  . aspirin EC 81 MG tablet Take 1 tablet (81 mg total) by mouth daily. 90 tablet 3  .  atorvastatin (LIPITOR) 40 MG tablet Take 1 tablet (40 mg total) by mouth daily. 30 tablet 0  . carvedilol (COREG) 25 MG tablet Take 1 tablet (25 mg total) by mouth 2 (two) times daily. 60 tablet 0  . colchicine 0.6 MG tablet Take 0.6 mg by mouth daily.    . furosemide (LASIX) 20 MG tablet Take 2 tablets (40 mg total) by mouth daily. 60 tablet 1  . gabapentin (NEURONTIN) 300 MG capsule Take 300 mg by mouth 3 (three) times daily.    . isosorbide-hydrALAZINE (BIDIL) 20-37.5 MG tablet Take 2 tablets by mouth 3 (three) times daily. 180 tablet 0  . naproxen sodium (ANAPROX) 220 MG tablet Take 220 mg by mouth daily as needed (back pain).  Reported on 08/26/2015    . omeprazole (PRILOSEC) 40 MG capsule Take 40 mg by mouth daily.  0  . sacubitril-valsartan (ENTRESTO) 97-103 MG Take 1 tablet by mouth 2 (two) times daily. 60 tablet 0  . spironolactone (ALDACTONE) 25 MG tablet Take 1 tablet (25 mg total) by mouth daily. 30 tablet 3  . traMADol (ULTRAM) 50 MG tablet Take 1 tablet (50 mg total) by mouth every 12 (twelve) hours as needed (twice daily as needed for pain). 60 tablet 0   No current facility-administered medications for this encounter.      BP 118/84 (BP Location: Left Wrist, Patient Position: Sitting, Cuff Size: Normal)   Pulse 82   Wt 221 lb 6.4 oz (100.4 kg)   SpO2 96%   BMI 39.22 kg/m    Wt Readings from Last 3 Encounters:  03/21/17 221 lb 6.4 oz (100.4 kg)  03/16/17 219 lb (99.3 kg)  03/11/17 221 lb 3.2 oz (100.3 kg)   General: Elderly. Arrived in wheel chair. No resp difficulty HEENT: normal Neck: supple. JVP 5-6 . Carotids 2+ bilat; no bruits. No lymphadenopathy or thryomegaly appreciated. Cor: PMI nondisplaced. Regular rate & rhythm. No rubs, gallops or murmurs. Lungs: clear Abdomen: soft, nontender, nondistended. No hepatosplenomegaly. No bruits or masses. Good bowel sounds. Extremities: no cyanosis, clubbing, rash, R and LLE edema Neuro: alert & orientedx3, cranial nerves grossly intact. moves all 4 extremities w/o difficulty. Affect pleasant  Assessment/Plan: 1. HTN: Has history of CVA and CKD.  Renal artery dopplers were normal.   Stable. . 2. CAD: Moderate diffuse CAD, no severe obstruction.  This did not cause her cardiomyopathy.  -No S/S ischemia   - Continue ASA 81 - Continue atorvastatin 40 mg daily, good lipids in 4/18.    3. CKD III:  . - Avoid NSAID use. Creatinine has been higher than her baseline but I suspect she was dry due to GI illness.  - Check BMET today.    4. 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  ECHO 06/2016 EF 40%.  Medtronic ICD-  optivol-fluid index low. - Volume status stable. May need to cut back lasix but for now will continue current dose of lasix.   On goal dose bb and entresto.  Cut back bidil 2 tab twice a day.  Continue spiro.  5. OSA: Needs CPAP fixed. We discussed.  6. Polypharmacy: Continue paramedicine. 7. Fall, mechanical Needs Life Alert. Referred to [facility].   I called Dee with Paramedicine to discuss medication changes.   Follow up in 2 months.   Darrick Grinder, NP-C  03/21/2017

## 2017-03-21 NOTE — Patient Instructions (Addendum)
DECREASE Bidil to twice daily.  Routine lab work today. Will notify you of abnormal results, otherwise no news is good news!  Follow up 3 months with Amy Clegg NP-C.  Take all medication as prescribed the day of your appointment. Bring all medications with you to your appointment.  Do the following things EVERYDAY: 1) Weigh yourself in the morning before breakfast. Write it down and keep it in a log. 2) Take your medicines as prescribed 3) Eat low salt foods-Limit salt (sodium) to 2000 mg per day.  4) Stay as active as you can everyday 5) Limit all fluids for the day to less than 2 liters

## 2017-03-24 ENCOUNTER — Other Ambulatory Visit (HOSPITAL_COMMUNITY): Payer: Self-pay

## 2017-03-24 NOTE — Progress Notes (Signed)
Paramedicine Encounter    Patient ID: Tara Dunn, female    DOB: May 28, 1940, 77 y.o.   MRN: [id number]    Patient Care Team: Fanny Bien, MD as PCP - General (Family Medicine)  Patient Active Problem List   Diagnosis Date Noted  . Joint pain 12/09/2015  . ICD (implantable cardioverter-defibrillator) in place 03/26/2015  . Syncope 03/06/2015  . Obesity (BMI 30-39.9) 01/21/2015  . Excessive daytime sleepiness 08/04/2014  . OSA (obstructive sleep apnea) 08/04/2014  . Essential hypertension 06/13/2014  . Chronic systolic CHF (congestive heart failure) ([place]) 05/20/2014  . Congestive heart disease ([place])   . CKD (chronic kidney disease), stage III ([place]) 05/03/2014  . Hx of stroke without residual deficits 05/03/2014    Current Outpatient Medications:  .  allopurinol (ZYLOPRIM) 100 MG tablet, Take 200 mg by mouth daily. , Disp: , Rfl: 0 .  aspirin EC 81 MG tablet, Take 1 tablet (81 mg total) by mouth daily., Disp: 90 tablet, Rfl: 3 .  atorvastatin (LIPITOR) 40 MG tablet, Take 1 tablet (40 mg total) by mouth daily., Disp: 30 tablet, Rfl: 0 .  carvedilol (COREG) 25 MG tablet, Take 1 tablet (25 mg total) by mouth 2 (two) times daily., Disp: 60 tablet, Rfl: 0 .  colchicine 0.6 MG tablet, Take 0.6 mg by mouth daily., Disp: , Rfl:  .  furosemide (LASIX) 20 MG tablet, Take 2 tablets (40 mg total) by mouth daily., Disp: 60 tablet, Rfl: 1 .  gabapentin (NEURONTIN) 300 MG capsule, Take 300 mg by mouth 3 (three) times daily., Disp: , Rfl:  .  isosorbide-hydrALAZINE (BIDIL) 20-37.5 MG tablet, Take 2 tablets by mouth 2 (two) times daily., Disp: 120 tablet, Rfl: 11 .  omeprazole (PRILOSEC) 40 MG capsule, Take 40 mg by mouth daily., Disp: , Rfl: 0 .  sacubitril-valsartan (ENTRESTO) 97-103 MG, Take 1 tablet by mouth 2 (two) times daily., Disp: 60 tablet, Rfl: 0 .  spironolactone (ALDACTONE) 25 MG tablet, Take 1 tablet (25 mg total) by mouth daily., Disp: 30 tablet, Rfl: 3 .  naproxen sodium  (ANAPROX) 220 MG tablet, Take 220 mg by mouth daily as needed (back pain). Reported on 08/26/2015, Disp: , Rfl:  .  traMADol (ULTRAM) 50 MG tablet, Take 1 tablet (50 mg total) by mouth every 12 (twelve) hours as needed (twice daily as needed for pain)., Disp: 60 tablet, Rfl: 0 Allergies  Allergen Reactions  . Shellfish Allergy Hives     Social History   Socioeconomic History  . Marital status: Widowed    Spouse name: Not on file  . Number of children: Not on file  . Years of education: Not on file  . Highest education level: Not on file  Social Needs  . Financial resource strain: Not on file  . Food insecurity - worry: Not on file  . Food insecurity - inability: Not on file  . Transportation needs - medical: Not on file  . Transportation needs - non-medical: Not on file  Occupational History  . Not on file  Tobacco Use  . Smoking status: Former Smoker    Packs/day: 0.50    Years: 40.00    Pack years: 20.00    Types: Cigarettes  . Smokeless tobacco: Never Used  . Tobacco comment: Quit in 2009.  Substance and Sexual Activity  . Alcohol use: No    Alcohol/week: 0.0 oz    Comment: "drank occasionally when I was young; last drink was in the late 1990s"  . Drug use:  No  . Sexual activity: No  Other Topics Concern  . Not on file  Social History Narrative   Lives alone in an independent living facility.  Education: 10th grade.     Retired from [employer] work.  Moved here from [place] in 2015.    Physical Exam      Future Appointments  Date Time Provider [place]  04/11/2017  9:00 AM Sueanne Margarita, MD [facility]  05/18/2017  7:25 AM [facility] DEVICE REMOTES [facility]  06/20/2017  9:30 AM [facility] PA/NP [facility] None    ATF [name] x4 sitting on the couch c/o foot pain. She stated that when she fell the other day and her foot got stuck, the pain hasn't gone away. Pt has taken all of her meds for the week. She requested that I pu the colchicine  in the box due to the pain.  Pt denies sob, dizziness and chest pain.    There were no vitals taken for this visit.  Weight yesterday- Last visit weight-  **rx called in: entresto Colchicine Furosemide   Roanna Reaves, EMT Paramedic 03/24/2017    ACTION: Home visit completed

## 2017-03-25 ENCOUNTER — Other Ambulatory Visit (HOSPITAL_COMMUNITY): Payer: Self-pay | Admitting: *Deleted

## 2017-03-25 MED ORDER — SACUBITRIL-VALSARTAN 97-103 MG PO TABS: 1.0000 | ORAL_TABLET | Freq: Two times a day (BID) | ORAL | 3 refills | Status: DC

## 2017-03-28 DIAGNOSIS — I10 Essential (primary) hypertension: Secondary | ICD-10-CM | POA: Diagnosis not present

## 2017-03-28 DIAGNOSIS — K219 Gastro-esophageal reflux disease without esophagitis: Secondary | ICD-10-CM | POA: Diagnosis not present

## 2017-03-28 DIAGNOSIS — I509 Heart failure, unspecified: Secondary | ICD-10-CM | POA: Diagnosis not present

## 2017-03-28 DIAGNOSIS — N19 Unspecified kidney failure: Secondary | ICD-10-CM | POA: Diagnosis not present

## 2017-03-28 DIAGNOSIS — I639 Cerebral infarction, unspecified: Secondary | ICD-10-CM | POA: Diagnosis not present

## 2017-03-28 DIAGNOSIS — M170 Bilateral primary osteoarthritis of knee: Secondary | ICD-10-CM | POA: Diagnosis not present

## 2017-04-01 ENCOUNTER — Other Ambulatory Visit: Payer: Self-pay | Admitting: Internal Medicine

## 2017-04-01 NOTE — Telephone Encounter (Signed)
This is a CHF pt 

## 2017-04-04 ENCOUNTER — Other Ambulatory Visit (HOSPITAL_COMMUNITY): Payer: Self-pay | Admitting: *Deleted

## 2017-04-04 DIAGNOSIS — M170 Bilateral primary osteoarthritis of knee: Secondary | ICD-10-CM | POA: Diagnosis not present

## 2017-04-04 MED ORDER — SACUBITRIL-VALSARTAN 97-103 MG PO TABS: 1.0000 | ORAL_TABLET | Freq: Two times a day (BID) | ORAL | 3 refills | Status: DC

## 2017-04-07 ENCOUNTER — Other Ambulatory Visit (HOSPITAL_COMMUNITY): Payer: Self-pay

## 2017-04-07 NOTE — Progress Notes (Signed)
Paramedicine Encounter    Patient ID: Tara Dunn, female    DOB: Feb 01, 1941, 77 y.o.   MRN: [id number]    Patient Care Team: Fanny Bien, MD as PCP - General (Family Medicine)  Patient Active Problem List   Diagnosis Date Noted  . Joint pain 12/09/2015  . ICD (implantable cardioverter-defibrillator) in place 03/26/2015  . Syncope 03/06/2015  . Obesity (BMI 30-39.9) 01/21/2015  . Excessive daytime sleepiness 08/04/2014  . OSA (obstructive sleep apnea) 08/04/2014  . Essential hypertension 06/13/2014  . Chronic systolic CHF (congestive heart failure) ([place]) 05/20/2014  . Congestive heart disease ([place])   . CKD (chronic kidney disease), stage III ([place]) 05/03/2014  . Hx of stroke without residual deficits 05/03/2014    Current Outpatient Medications:  .  allopurinol (ZYLOPRIM) 100 MG tablet, Take 200 mg by mouth daily. , Disp: , Rfl: 0 .  aspirin EC 81 MG tablet, Take 1 tablet (81 mg total) by mouth daily., Disp: 90 tablet, Rfl: 3 .  atorvastatin (LIPITOR) 40 MG tablet, Take 1 tablet (40 mg total) by mouth daily., Disp: 30 tablet, Rfl: 0 .  carvedilol (COREG) 25 MG tablet, Take 1 tablet (25 mg total) by mouth 2 (two) times daily., Disp: 60 tablet, Rfl: 0 .  gabapentin (NEURONTIN) 300 MG capsule, Take 300 mg by mouth 3 (three) times daily., Disp: , Rfl:  .  isosorbide-hydrALAZINE (BIDIL) 20-37.5 MG tablet, Take 2 tablets by mouth 2 (two) times daily., Disp: 120 tablet, Rfl: 11 .  omeprazole (PRILOSEC) 40 MG capsule, Take 40 mg by mouth daily., Disp: , Rfl: 0 .  sacubitril-valsartan (ENTRESTO) 97-103 MG, Take 1 tablet by mouth 2 (two) times daily., Disp: 60 tablet, Rfl: 3 .  spironolactone (ALDACTONE) 25 MG tablet, Take 1 tablet (25 mg total) by mouth daily., Disp: 30 tablet, Rfl: 3 .  colchicine 0.6 MG tablet, Take 0.6 mg by mouth daily., Disp: , Rfl:  .  furosemide (LASIX) 20 MG tablet, Take 2 tablets (40 mg total) by mouth daily., Disp: 60 tablet, Rfl: 1 .  naproxen sodium  (ANAPROX) 220 MG tablet, Take 220 mg by mouth daily as needed (back pain). Reported on 08/26/2015, Disp: , Rfl:  .  traMADol (ULTRAM) 50 MG tablet, Take 1 tablet (50 mg total) by mouth every 12 (twelve) hours as needed (twice daily as needed for pain)., Disp: 60 tablet, Rfl: 0 Allergies  Allergen Reactions  . Shellfish Allergy Hives     Social History   Socioeconomic History  . Marital status: Widowed    Spouse name: Not on file  . Number of children: Not on file  . Years of education: Not on file  . Highest education level: Not on file  Social Needs  . Financial resource strain: Not on file  . Food insecurity - worry: Not on file  . Food insecurity - inability: Not on file  . Transportation needs - medical: Not on file  . Transportation needs - non-medical: Not on file  Occupational History  . Not on file  Tobacco Use  . Smoking status: Former Smoker    Packs/day: 0.50    Years: 40.00    Pack years: 20.00    Types: Cigarettes  . Smokeless tobacco: Never Used  . Tobacco comment: Quit in 2009.  Substance and Sexual Activity  . Alcohol use: No    Alcohol/week: 0.0 oz    Comment: "drank occasionally when I was young; last drink was in the late 1990s"  . Drug use:  No  . Sexual activity: No  Other Topics Concern  . Not on file  Social History Narrative   Lives alone in an independent living facility.  Education: 10th grade.     Retired from [employer] work.  Moved here from [place] in 2015.    Physical Exam  Pulmonary/Chest: No respiratory distress. She has no wheezes.  Abdominal: She exhibits no distension. There is no tenderness.  Musculoskeletal: She exhibits edema.  Edema in right foot and ankle/ hx of same  Skin: Skin is warm and dry. She is not diaphoretic.        Future Appointments  Date Time Provider [place]  04/11/2017  9:00 AM Sueanne Margarita, MD [facility]  05/18/2017  7:25 AM [facility] DEVICE REMOTES [facility]  06/20/2017   9:30 AM [facility] PA/NP [facility] None    ATF [name] x4 sitting on the couch with no complaints.  She stated that her daughter just left to run errands for her.  Pt stated that her pcp asked about medications that she hadn't prescribed but she cant remember what the medication was.  Pt has taken all of her meds this week besides gabapentin.  Pt denies sob, dizziness and chest pain.  Pt is missing several meds that she hadnt picked them up yet.  I told pt that I will get for her tomorrow and revise her box then.  rx bottles verified and pill box refilled.    BP 112/70 (BP Location: Right Arm, Patient Position: Sitting, Cuff Size: Large)   Pulse 89   Resp 16   Wt 218 lb (98.9 kg)   SpO2 98%   BMI 38.62 kg/m    **pt doesn't have: Lasix sat; sun-wed night entrestosun-wed Colchicine sat eve; sun-wed  Weight yesterday- Last visit weight-    Javad Salva, EMT Paramedic 04/08/2017    ACTION: Home visit completed Next visit planned for tomorrow

## 2017-04-10 NOTE — Progress Notes (Signed)
Cardiology Office Note:    Date:  04/11/2017   ID:  Pleas Koch [place], DOB [birth year], MRN [id number]  PCP:  Fanny Bien, MD  Cardiologist:  No primary care provider on file.    Referring MD: Fanny Bien, MD   Chief Complaint  Patient presents with  . Sleep Apnea    History of Present Illness:    Tara Dunn is a 77 y.o. female with a hx of mild OSA with an AHI of 12.4/hr but during REM sleep the OSA was severe with an AHI of 34/hr. She had been on CPAP at 9cm H2O.  She has not been using her CPAP device because it is broke.  She has not used it since September  Past Medical History:  Diagnosis Date  . 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 Lovena Le  . Aortic dilatation ([place])    a. 04/2014 CTA chest w/ incidental finding of distal thoracic Ao enlargement of 3.39 mm - f/u needed 04/2015.  . Arthritis    "all over my body"  . Cancer of left breast ([place]) 2009   s/p L mastectomy and chemo.  . Cardiomyopathy ([place])    a. 04/2014 Echo: EF 25-30%, mod conc LVH, possibl antsept HK, Gr1 DD, mod-sev dil LA.  . CHF (congestive heart failure) ([place])   . CKD (chronic kidney disease), stage III ([place])   . Depression   . GERD (gastroesophageal reflux disease)   . Gout   . Heart murmur   . History of stomach ulcers   . Hypertension    a. Dx @ age 15;  b. 04/2014 admission for HTN emergency.  . Obesity (BMI 30-39.9) 01/21/2015  . OSA on CPAP   . Stroke [facility])    a. 3 strokes - last in 1998; "memory problems since"  (03/26/2015)    Past Surgical History:  Procedure Laterality Date  . ABDOMINAL HYSTERECTOMY    . BREAST BIOPSY Left 2008  . CATARACT EXTRACTION, BILATERAL Bilateral   . DILATION AND CURETTAGE OF UTERUS    . EP IMPLANTABLE DEVICE N/A 03/26/2015   MDT single chamber ICD, Dr. Lovena Le  . LEFT HEART CATHETERIZATION WITH CORONARY ANGIOGRAM N/A 05/07/2014   Procedure: LEFT HEART CATHETERIZATION WITH CORONARY ANGIOGRAM;  Surgeon: Belva Crome, MD;  Location: [facility] CATH LAB;  Service: Cardiovascular;  Laterality: N/A;  . MASTECTOMY Left 2009    Current Medications: Current Meds  Medication Sig  . allopurinol (ZYLOPRIM) 100 MG tablet Take 200 mg by mouth daily.   Marland Kitchen aspirin EC 81 MG tablet Take 1 tablet (81 mg total) by mouth daily.  Marland Kitchen atorvastatin (LIPITOR) 40 MG tablet Take 1 tablet (40 mg total) by mouth daily.  . carvedilol (COREG) 25 MG tablet Take 1 tablet (25 mg total) by mouth 2 (two) times daily.  . colchicine 0.6 MG tablet Take 0.6 mg by mouth daily.  . furosemide (LASIX) 20 MG tablet Take 2 tablets (40 mg total) by mouth daily.  Marland Kitchen gabapentin (NEURONTIN) 300 MG capsule Take 300 mg by mouth 3 (three) times daily.  . isosorbide-hydrALAZINE (BIDIL) 20-37.5 MG tablet Take 2 tablets by mouth 2 (two) times daily.  . naproxen sodium (ANAPROX) 220 MG tablet Take 220 mg by mouth daily as needed (back pain). Reported on 08/26/2015  . omeprazole (PRILOSEC) 40 MG capsule Take 40 mg by mouth daily.  . sacubitril-valsartan (ENTRESTO) 97-103 MG Take 1 tablet by mouth 2 (two) times daily.  Marland Kitchen spironolactone (ALDACTONE) 25 MG  tablet Take 1 tablet (25 mg total) by mouth daily.     Allergies:   Shellfish allergy   Social History   Socioeconomic History  . Marital status: Widowed    Spouse name: None  . Number of children: None  . Years of education: None  . Highest education level: None  Social Needs  . Financial resource strain: None  . Food insecurity - worry: None  . Food insecurity - inability: None  . Transportation needs - medical: None  . Transportation needs - non-medical: None  Occupational History  . None  Tobacco Use  . Smoking status: Former Smoker    Packs/day: 0.50    Years: 40.00    Pack years: 20.00    Types: Cigarettes  . Smokeless tobacco: Never Used  . Tobacco comment: Quit in 2009.  Substance and Sexual Activity  . Alcohol use: No    Alcohol/week: 0.0 oz    Comment: "drank occasionally when I  was young; last drink was in the late 1990s"  . Drug use: No  . Sexual activity: No  Other Topics Concern  . None  Social History Narrative   Lives alone in an independent living facility.  Education: 10th grade.     Retired from [employer] work.  Moved here from [place] in 2015.     Family History: The patient's family history includes Cancer in her daughter and sister; Heart attack in her father; Heart disease in her daughter; High blood pressure in her father, mother, and sister.  ROS:   Please see the history of present illness.    ROS  All other systems reviewed and negative.   EKGs/Labs/Other Studies Reviewed:    The following studies were reviewed today: CPAP download  EKG:  EKG is not ordered today.    Recent Labs: 03/16/2017: ALT 24; B Natriuretic Peptide 9.3; Hemoglobin 11.8; Platelets 137 03/21/2017: BUN 59; Creatinine, Ser 2.14; Potassium 4.6; Sodium 139   Recent Lipid Panel    Component Value Date/Time   CHOL 112 06/03/2016 1034   TRIG 43 06/03/2016 1034   HDL 57 06/03/2016 1034   CHOLHDL 2.0 06/03/2016 1034   VLDL 9 06/03/2016 1034   LDLCALC 46 06/03/2016 1034    Physical Exam:    VS:  BP (!) 152/86   Pulse 80   Ht 5\' 3"  (1.6 m)   Wt 221 lb 6.4 oz (100.4 kg)   SpO2 97%   BMI 39.22 kg/m     Wt Readings from Last 3 Encounters:  04/11/17 221 lb 6.4 oz (100.4 kg)  04/07/17 218 lb (98.9 kg)  03/21/17 221 lb 6.4 oz (100.4 kg)     GEN:  Well nourished, well developed in no acute distress HEENT: Normal NECK: No JVD; No carotid bruits LYMPHATICS: No lymphadenopathy CARDIAC: RRR, no murmurs, rubs, gallops RESPIRATORY:  Clear to auscultation without rales, wheezing or rhonchi  ABDOMEN: Soft, non-tender, non-distended MUSCULOSKELETAL:  No edema; No deformity  SKIN: Warm and dry NEUROLOGIC:  Alert and oriented x 3 PSYCHIATRIC:  Normal affect   ASSESSMENT:    1. OSA (obstructive sleep apnea)   2. Essential hypertension   3. Obesity (BMI 30-39.9)    PLAN:      In order of problems listed above:  1.  OSA - She has not been using her device as it is broke.  I asked her to [facility] her device to [facility] for them to look at.  Then I will get a download in 2 months.  2.  HTN - BP is borderline controlled on exam today. She has not taken her BP meds yet today.  She will continue on spironolactone 25 mg daily, carvedilol 25 mg twice daily, BiDil 20-37.5 mg twice daily and Entresto 97-1-3 mg twice daily.  3.  Obesity - her exercise is limited due to using a walker.     Medication Adjustments/Labs and Tests Ordered: Current medicines are reviewed at length with the patient today.  Concerns regarding medicines are outlined above.  No orders of the defined types were placed in this encounter.  No orders of the defined types were placed in this encounter.   Signed, Fransico Him, MD  04/11/2017 9:31 AM    [place]

## 2017-04-11 ENCOUNTER — Encounter: Payer: Self-pay | Admitting: Cardiology

## 2017-04-11 ENCOUNTER — Other Ambulatory Visit (HOSPITAL_COMMUNITY): Payer: Self-pay | Admitting: *Deleted

## 2017-04-11 ENCOUNTER — Ambulatory Visit: Payer: PPO | Admitting: Cardiology

## 2017-04-11 VITALS — BP 152/86 | HR 80 | Ht 63.0 in | Wt 221.4 lb

## 2017-04-11 DIAGNOSIS — G4733 Obstructive sleep apnea (adult) (pediatric): Secondary | ICD-10-CM

## 2017-04-11 DIAGNOSIS — I10 Essential (primary) hypertension: Secondary | ICD-10-CM | POA: Diagnosis not present

## 2017-04-11 DIAGNOSIS — N179 Acute kidney failure, unspecified: Secondary | ICD-10-CM | POA: Diagnosis not present

## 2017-04-11 DIAGNOSIS — N184 Chronic kidney disease, stage 4 (severe): Secondary | ICD-10-CM | POA: Diagnosis not present

## 2017-04-11 DIAGNOSIS — E669 Obesity, unspecified: Secondary | ICD-10-CM

## 2017-04-11 DIAGNOSIS — C50919 Malignant neoplasm of unspecified site of unspecified female breast: Secondary | ICD-10-CM | POA: Diagnosis not present

## 2017-04-11 MED ORDER — FUROSEMIDE 40 MG PO TABS: 40.0000 mg | ORAL_TABLET | Freq: Every day | ORAL | 3 refills | Status: DC

## 2017-04-11 NOTE — Patient Instructions (Signed)
Medication Instructions:  Your physician recommends that you continue on your current medications as directed. Please refer to the Current Medication list given to you today.  If you need a refill on your cardiac medications, please contact your pharmacy first.  Labwork: None ordered   Testing/Procedures: None ordered   Follow-Up: Your physician wants you to follow-up in: 1 year with Dr. Radford Pax. You will receive a reminder letter in the mail two months in advance. If you don't receive a letter, please call our office to schedule the follow-up appointment.  Any Other Special Instructions Will Be Listed Below (If Applicable). Call advance home health to get a download after you have started using your cpap   Thank you for choosing [facility], RN  [phone]  If you need a refill on your cardiac medications before your next appointment, please call your pharmacy.

## 2017-04-14 ENCOUNTER — Emergency Department (HOSPITAL_COMMUNITY): Payer: PPO

## 2017-04-14 ENCOUNTER — Other Ambulatory Visit (HOSPITAL_COMMUNITY): Payer: Self-pay | Admitting: *Deleted

## 2017-04-14 ENCOUNTER — Encounter (HOSPITAL_COMMUNITY): Payer: Self-pay | Admitting: Emergency Medicine

## 2017-04-14 ENCOUNTER — Telehealth: Payer: Self-pay | Admitting: Licensed Clinical Social Worker

## 2017-04-14 ENCOUNTER — Other Ambulatory Visit: Payer: Self-pay

## 2017-04-14 ENCOUNTER — Emergency Department (HOSPITAL_COMMUNITY)
Admission: EM | Admit: 2017-04-14 | Discharge: 2017-04-14 | Disposition: A | Discharge status: Home / Self Care | Payer: PPO | Attending: Physician Assistant | Admitting: Physician Assistant

## 2017-04-14 ENCOUNTER — Telehealth (HOSPITAL_COMMUNITY): Payer: Self-pay | Admitting: *Deleted

## 2017-04-14 DIAGNOSIS — Z7982 Long term (current) use of aspirin: Secondary | ICD-10-CM | POA: Insufficient documentation

## 2017-04-14 DIAGNOSIS — Z79899 Other long term (current) drug therapy: Secondary | ICD-10-CM | POA: Diagnosis not present

## 2017-04-14 DIAGNOSIS — I509 Heart failure, unspecified: Secondary | ICD-10-CM | POA: Diagnosis not present

## 2017-04-14 DIAGNOSIS — I5022 Chronic systolic (congestive) heart failure: Secondary | ICD-10-CM

## 2017-04-14 DIAGNOSIS — Z87891 Personal history of nicotine dependence: Secondary | ICD-10-CM | POA: Insufficient documentation

## 2017-04-14 DIAGNOSIS — I440 Atrioventricular block, first degree: Secondary | ICD-10-CM | POA: Diagnosis not present

## 2017-04-14 DIAGNOSIS — R531 Weakness: Secondary | ICD-10-CM

## 2017-04-14 DIAGNOSIS — Z950 Presence of cardiac pacemaker: Secondary | ICD-10-CM | POA: Diagnosis not present

## 2017-04-14 DIAGNOSIS — S0990XA Unspecified injury of head, initial encounter: Secondary | ICD-10-CM | POA: Diagnosis not present

## 2017-04-14 DIAGNOSIS — R404 Transient alteration of awareness: Secondary | ICD-10-CM | POA: Diagnosis not present

## 2017-04-14 DIAGNOSIS — N289 Disorder of kidney and ureter, unspecified: Secondary | ICD-10-CM | POA: Diagnosis not present

## 2017-04-14 DIAGNOSIS — J9811 Atelectasis: Secondary | ICD-10-CM | POA: Diagnosis not present

## 2017-04-14 DIAGNOSIS — I10 Essential (primary) hypertension: Secondary | ICD-10-CM | POA: Insufficient documentation

## 2017-04-14 DIAGNOSIS — R5381 Other malaise: Secondary | ICD-10-CM | POA: Diagnosis not present

## 2017-04-14 LAB — CBC WITH DIFFERENTIAL/PLATELET
BASOS PCT: 1 %
Basophils Absolute: 0 10*3/uL (ref 0.0–0.1)
Eosinophils Absolute: 0.1 10*3/uL (ref 0.0–0.7)
Eosinophils Relative: 2 %
HEMATOCRIT: 34.1 % — AB (ref 36.0–46.0)
Hemoglobin: 11 g/dL — ABNORMAL LOW (ref 12.0–15.0)
Lymphocytes Relative: 27 %
Lymphs Abs: 1 10*3/uL (ref 0.7–4.0)
MCH: 29.5 pg (ref 26.0–34.0)
MCHC: 32.3 g/dL (ref 30.0–36.0)
MCV: 91.4 fL (ref 78.0–100.0)
MONOS PCT: 13 %
Monocytes Absolute: 0.5 10*3/uL (ref 0.1–1.0)
NEUTROS ABS: 2.2 10*3/uL (ref 1.7–7.7)
Neutrophils Relative %: 57 %
Platelets: 135 10*3/uL — ABNORMAL LOW (ref 150–400)
RBC: 3.73 MIL/uL — ABNORMAL LOW (ref 3.87–5.11)
RDW: 17 % — AB (ref 11.5–15.5)
WBC: 3.9 10*3/uL — ABNORMAL LOW (ref 4.0–10.5)

## 2017-04-14 LAB — COMPREHENSIVE METABOLIC PANEL
ALBUMIN: 3.7 g/dL (ref 3.5–5.0)
ALK PHOS: 78 U/L (ref 38–126)
ALT: 42 U/L (ref 14–54)
ANION GAP: 10 (ref 5–15)
AST: 56 U/L — ABNORMAL HIGH (ref 15–41)
BUN: 41 mg/dL — ABNORMAL HIGH (ref 6–20)
CALCIUM: 9.4 mg/dL (ref 8.9–10.3)
CO2: 22 mmol/L (ref 22–32)
Chloride: 109 mmol/L (ref 101–111)
Creatinine, Ser: 1.64 mg/dL — ABNORMAL HIGH (ref 0.44–1.00)
GFR calc Af Amer: 34 mL/min — ABNORMAL LOW (ref >60)
GFR calc non Af Amer: 29 mL/min — ABNORMAL LOW (ref >60)
Glucose, Bld: 92 mg/dL (ref 65–99)
Potassium: 4.2 mmol/L (ref 3.5–5.1)
SODIUM: 141 mmol/L (ref 135–145)
Total Bilirubin: 0.5 mg/dL (ref 0.3–1.2)
Total Protein: 5.7 g/dL — ABNORMAL LOW (ref 6.5–8.1)

## 2017-04-14 LAB — I-STAT TROPONIN, ED: Troponin i, poc: 0.03 ng/mL (ref 0.00–0.08)

## 2017-04-14 MED ORDER — FUROSEMIDE 40 MG PO TABS: 40.0000 mg | ORAL_TABLET | Freq: Every day | ORAL | 3 refills | Status: DC

## 2017-04-14 NOTE — Telephone Encounter (Signed)
Received call from Paramedicine, they are worried about patient as she has had a fall and seems to be gradually declining, they feel she would greatly benefit from [facility], [place] and CSW to help.  Discussed w/Dr Aundra Dubin and our CSW Kennyth Lose, both feel [facility] referral appropriate and should be done.  Orders placed.

## 2017-04-14 NOTE — ED Triage Notes (Signed)
Pt has slurred speech at baseline via EMS when they spoke to daughter from previous 3 strokes.

## 2017-04-14 NOTE — Telephone Encounter (Signed)
CSW contacted patient this morning to follow up on concerns shared by [facility] Paramedic that patient might need to additional services at home. Patient shared with CSW that she fell this morning in the shower. Patient states she has been getting weaker and having difficulty with ambulating. Patient 's daughter [name] got on the phone and shared that she is concerned about leaving patient alone due to inability to care for self and ambulate around the apartment safely as she lives alone. CSW encouraged daughter to call PCP and CSW would reach out to insurance to inquire about possible homecare/rehab benefits. CSW discussed with HF RN about homecare referral for RN,PT and SW evaluations. Referral sent to [facility]. Patient's daughter returned call later to inform that she was encouraged to take patient to the ED for further evaluation.  CSW discussed case with Karena Addison from Paramedicine who will follow up pending outcome of ED visit. CSW continues to follow for support and care coordination with Paramedicine program. [name], [place], [place]

## 2017-04-14 NOTE — Care Management Note (Addendum)
Case Management Note  Patient Details  Name: Tara Dunn MRN: [id number] Date of Birth: 06-13-1940  Subjective/Objective:                  General Weakness  Action/Plan: [facility] spoke with the patient and her daughter, Tara Dunn, at the bedside. Patient lives at home alone. She has a walker and a cane. Reports she was able to walk with her walker yesterday but her legs are weak today. Patient's daughter would like the patient to be placed in a SNF possibly just for short term rehab. Patient states she has a Marine scientist, Karena Addison, with the heart failure program. She will discharge to her daughter's home in [facility]. They selected [facility] for home health services and plan to discuss placement with the [facility] CSW. Orders faxed to [facility].   Daughter's phone numbers are [phone] and cell [phone].  Expected Discharge Date:   04/14/17               Expected Discharge Plan:  [place]  In-House Referral:  Clinical Social Work  Discharge planning Services  CM Consult  Post Acute Care Choice:  Home Health Choice offered to:  Patient, Adult Children  DME Arranged:  N/A DME Agency:  NA  HH Arranged:  RN, PT, OT, Nurse's Aide, Social Work, Oncologist HH Agency:  [address]  Status of Service:  Completed, signed off  If discussed at [employer], dates discussed:    Additional Comments:  Apolonio Schneiders, RN 04/14/2017, 6:06 PM

## 2017-04-14 NOTE — Discharge Instructions (Signed)
We are so sorry that you are feeling more weak than usual.  We understand that this is been going on for a while and has gotten worse.  We hope that we have provided some services that might help you at home and transitioning to a place that you could be better cared for.

## 2017-04-14 NOTE — ED Notes (Signed)
Phlebotomy to draw labs.

## 2017-04-14 NOTE — ED Triage Notes (Signed)
Pt here via EMS Weakness in both legs and states that she was unable to walk today and fell on her buttocks in the bath tub, denies injuries or syncope. 146/96, 88, 96%, CBG 128. A/O x 4. Pt in community health program with paramedic and was supposed to see them today before call for EMS.

## 2017-04-14 NOTE — ED Provider Notes (Signed)
[facility] EMERGENCY DEPARTMENT Provider Note   CSN: [id number] Arrival date & time: 04/14/17  1250     History   Chief Complaint Chief Complaint  Patient presents with  . Weakness    HPI Tara Dunn is a 77 y.o. female.  The history is provided by the patient and a relative. No language interpreter was used.  Weakness    Tara Dunn is a 77 y.o. female who presents to the Emergency Department complaining of weakness.  She presents for evaluation of weakness from home.  She does have a history of chronic weakness with difficulty ambulating.  Her daughter states that recently she has needed assistance getting her legs into the vehicle.  The patient was too weak to go from her chair to the bed last night so she slept in the chair today.  Today she slipped in the shower and was too weak to walk.  She denies any head injury, loss of consciousness, chest pain, shortness breath, abdominal pain, nausea, vomiting, recent illnesses or medication changes. Past Medical History:  Diagnosis Date  . 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 Lovena Le  . Aortic dilatation ([place])    a. 04/2014 CTA chest w/ incidental finding of distal thoracic Ao enlargement of 3.39 mm - f/u needed 04/2015.  . Arthritis    "all over my body"  . Cancer of left breast ([place]) 2009   s/p L mastectomy and chemo.  . Cardiomyopathy ([place])    a. 04/2014 Echo: EF 25-30%, mod conc LVH, possibl antsept HK, Gr1 DD, mod-sev dil LA.  . CHF (congestive heart failure) ([place])   . CKD (chronic kidney disease), stage III ([place])   . Depression   . GERD (gastroesophageal reflux disease)   . Gout   . Heart murmur   . History of stomach ulcers   . Hypertension    a. Dx @ age 24;  b. 04/2014 admission for HTN emergency.  . Obesity (BMI 30-39.9) 01/21/2015  . OSA on CPAP   . Stroke [facility])    a. 3 strokes - last in 1998; "memory problems since"  (03/26/2015)    Patient Active  Problem List   Diagnosis Date Noted  . Joint pain 12/09/2015  . ICD (implantable cardioverter-defibrillator) in place 03/26/2015  . Syncope 03/06/2015  . Obesity (BMI 30-39.9) 01/21/2015  . Excessive daytime sleepiness 08/04/2014  . OSA (obstructive sleep apnea) 08/04/2014  . Essential hypertension 06/13/2014  . Chronic systolic CHF (congestive heart failure) ([place]) 05/20/2014  . Congestive heart disease ([place])   . CKD (chronic kidney disease), stage III ([place]) 05/03/2014  . Hx of stroke without residual deficits 05/03/2014    Past Surgical History:  Procedure Laterality Date  . ABDOMINAL HYSTERECTOMY    . BREAST BIOPSY Left 2008  . CATARACT EXTRACTION, BILATERAL Bilateral   . DILATION AND CURETTAGE OF UTERUS    . EP IMPLANTABLE DEVICE N/A 03/26/2015   MDT single chamber ICD, Dr. Lovena Le  . LEFT HEART CATHETERIZATION WITH CORONARY ANGIOGRAM N/A 05/07/2014   Procedure: LEFT HEART CATHETERIZATION WITH CORONARY ANGIOGRAM;  Surgeon: Belva Crome, MD;  Location: [facility] CATH LAB;  Service: Cardiovascular;  Laterality: N/A;  . MASTECTOMY Left 2009    OB History    No data available       Home Medications    Prior to Admission medications   Medication Sig Start Date End Date Taking? Authorizing Provider  allopurinol (ZYLOPRIM) 100 MG tablet Take 200 mg by mouth  daily.  03/28/14   [provider]  aspirin EC 81 MG tablet Take 1 tablet (81 mg total) by mouth daily. 05/20/14   Larey Dresser, MD  atorvastatin (LIPITOR) 40 MG tablet Take 1 tablet (40 mg total) by mouth daily. 10/26/16   Larey Dresser, MD  carvedilol (COREG) 25 MG tablet Take 1 tablet (25 mg total) by mouth 2 (two) times daily. 10/26/16   Larey Dresser, MD  colchicine 0.6 MG tablet Take 0.6 mg by mouth daily.    [provider]  furosemide (LASIX) 40 MG tablet Take 1 tablet (40 mg total) by mouth daily. 04/14/17   Larey Dresser, MD  gabapentin (NEURONTIN) 300 MG capsule Take 300 mg by mouth 3 (three)  times daily.    [provider]  isosorbide-hydrALAZINE (BIDIL) 20-37.5 MG tablet Take 2 tablets by mouth 2 (two) times daily. 03/21/17   Clegg, Amy D, NP  naproxen sodium (ANAPROX) 220 MG tablet Take 220 mg by mouth daily as needed (back pain). Reported on 08/26/2015    [provider]  omeprazole (PRILOSEC) 40 MG capsule Take 40 mg by mouth daily. 03/28/14   [provider]  sacubitril-valsartan (ENTRESTO) 97-103 MG Take 1 tablet by mouth 2 (two) times daily. 04/04/17   Larey Dresser, MD  spironolactone (ALDACTONE) 25 MG tablet Take 1 tablet (25 mg total) by mouth daily. 02/10/17   Larey Dresser, MD    Family History Family History  Problem Relation Age of Onset  . High blood pressure Mother        Died @ 39.  . High blood pressure Father   . Heart attack Father        Died in his early 23's.  . High blood pressure Sister   . Cancer Sister   . Cancer Daughter   . Heart disease Daughter     Social History Social History   Tobacco Use  . Smoking status: Former Smoker    Packs/day: 0.50    Years: 40.00    Pack years: 20.00    Types: Cigarettes  . Smokeless tobacco: Never Used  . Tobacco comment: Quit in 2009.  Substance Use Topics  . Alcohol use: No    Alcohol/week: 0.0 oz    Comment: "drank occasionally when I was young; last drink was in the late 1990s"  . Drug use: No     Allergies   Shellfish allergy   Review of Systems Review of Systems  Neurological: Positive for weakness.  All other systems reviewed and are negative.    Physical Exam Updated Vital Signs BP (!) 154/81   Pulse 72   Temp 98.4 F (36.9 C) (Oral)   Resp (!) 21   Wt 95.7 kg (211 lb)   SpO2 99%   BMI 37.38 kg/m   Physical Exam  Constitutional: She is oriented to person, place, and time. She appears well-developed and well-nourished.  HENT:  Head: Normocephalic and atraumatic.  Cardiovascular: Normal rate and regular rhythm.  No murmur  heard. Pulmonary/Chest: Effort normal and breath sounds normal. No respiratory distress.  Abdominal: Soft. There is no tenderness. There is no rebound and no guarding.  Musculoskeletal: She exhibits no tenderness.  Nonpitting edema to bilateral lower extremities.  2+ DP pulses bilaterally.  Neurological: She is alert and oriented to person, place, and time.  Mild right facial droop.  5 out of 5 strength in bilateral upper extremities.  EOMI.  3 out of 5 strength  in bilateral lower extremities.  Skin: Skin is warm and dry.  Psychiatric: She has a normal mood and affect. Her behavior is normal.  Nursing note and vitals reviewed.    ED Treatments / Results  Labs (all labs ordered are listed, but only abnormal results are displayed) Labs Reviewed  CBC WITH DIFFERENTIAL/PLATELET - Abnormal; Notable for the following components:      Result Value   WBC 3.9 (*)    RBC 3.73 (*)    Hemoglobin 11.0 (*)    HCT 34.1 (*)    RDW 17.0 (*)    Platelets 135 (*)    All other components within normal limits  COMPREHENSIVE METABOLIC PANEL  URINALYSIS, ROUTINE W REFLEX MICROSCOPIC  I-STAT TROPONIN, ED    EKG  EKG Interpretation  Date/Time:  Thursday April 14 2017 12:58:18 EST Ventricular Rate:  84 PR Interval:  228 QRS Duration: 132 QT Interval:  392 QTC Calculation: 463 R Axis:   -54 Text Interpretation:  Sinus rhythm with 1st degree A-V block Reconfirmed by Zenovia Jarred [phone]) on 04/14/2017 4:19:01 PM       Radiology Dg Chest 2 View  Result Date: 04/14/2017 CLINICAL DATA:  Weakness in both legs.  History of 3 strokes. EXAM: CHEST - 2 VIEW COMPARISON:  03/16/2017 FINDINGS: Stable mild cardiac enlargement with atherosclerotic tortuous thoracic aorta. ICD device is unchanged in appearance with lead in the right ventricle. Minimal atelectasis is seen at the lung bases right greater than left. No pneumonic consolidation or CHF. Subchondral degenerative cystic change noted of the glenoids  of both shoulders. IMPRESSION: No active cardiopulmonary disease. Electronically Signed   By: Ashley Royalty M.D.   On: 04/14/2017 14:19   Ct Head Wo Contrast  Result Date: 04/14/2017 CLINICAL DATA:  Head trauma, status post fall EXAM: CT HEAD WITHOUT CONTRAST TECHNIQUE: Contiguous axial images were obtained from the base of the skull through the vertex without intravenous contrast. COMPARISON:  None. FINDINGS: Brain: No evidence of acute infarction, hemorrhage, extra-axial collection, ventriculomegaly, or mass effect. Generalized cerebral atrophy. Periventricular white matter low attenuation likely secondary to microangiopathy. Vascular: Cerebrovascular atherosclerotic calcifications are noted. Skull: Negative for fracture or focal lesion. Sinuses/Orbits: Visualized portions of the orbits are unremarkable. Visualized portions of the paranasal sinuses and mastoid air cells are unremarkable. Other: None. IMPRESSION: 1. No acute intracranial pathology. 2. Chronic microvascular disease and cerebral atrophy. Electronically Signed   By: Kathreen Devoid   On: 04/14/2017 15:11    Procedures Procedures (including critical care time)  Medications Ordered in ED Medications - No data to display   Initial Impression / Assessment and Plan / ED Course  I have reviewed the triage vital signs and the nursing notes.  Pertinent labs & imaging results that were available during my care of the patient were reviewed by me and considered in my medical decision making (see chart for details).     Patient with history of CHF and recurrent CVA here for evaluation of progressive bilateral lower extremity weakness.  She has 3 out of 5 strength in bilateral lower extremities with good strength in bilateral upper extremities.  No pain in the neck or back.  No features concerning for acute spinal cord injury or compressive process.  She is nontoxic appearing on examination.  Patient care transferred pending laboratory evaluation to  evaluate for Electra light disturbance, UTI.  Current presentation is not consistent with CVA.  Final Clinical Impressions(s) / ED Diagnoses   Final diagnoses:  None  ED Discharge Orders    None       Quintella Reichert, MD 04/14/17 [phone]

## 2017-04-15 ENCOUNTER — Other Ambulatory Visit: Payer: Self-pay

## 2017-04-15 ENCOUNTER — Telehealth (HOSPITAL_COMMUNITY): Payer: Self-pay | Admitting: Surgery

## 2017-04-15 ENCOUNTER — Encounter (HOSPITAL_COMMUNITY): Payer: Self-pay

## 2017-04-15 ENCOUNTER — Other Ambulatory Visit (HOSPITAL_COMMUNITY): Payer: Self-pay

## 2017-04-15 ENCOUNTER — Telehealth: Payer: Self-pay | Admitting: *Deleted

## 2017-04-15 NOTE — Telephone Encounter (Signed)
Patient referred to [facility] for additional source of support as she seems to be declining at home.

## 2017-04-15 NOTE — Patient Outreach (Signed)
[facility]) Care Management  04/15/2017  Everlene Chipley 1940-03-29 [id number]    Assessment: 77 year old with history of CKD,  Heart failure, stroke, ICD, OSA.  Referral received 04/15/17 post hospital emergency room visit. RNCM called both listed numbers. No answer. HIPPA compliant message left on temporary contact number; unable to leave message at home number.  Plan: follow up next business day.  Thea Silversmith, RN, MSN, [place] Coordinator Cell: [phone]

## 2017-04-15 NOTE — Telephone Encounter (Signed)
-----   Message from [place], RN sent at 04/11/2017  9:46 AM EST ----- Regarding: 2 month d/l  Patient advised to f/u with [facility] for instructions on fixing her cpap. Once she has started her cpap she will get a 2 month D/L.   Thanks  [employer]

## 2017-04-15 NOTE — Telephone Encounter (Signed)
Per DPR spoke to the daughter to follow up on patients appointment to get her cpap fixed and was informed the patient had an appointment on Thursday with [facility] and had to reschedule. Pap assistant left a message with the daughter for her mom to contact our office once that appointment has been completed.

## 2017-04-15 NOTE — Progress Notes (Signed)
Paramedicine Encounter    Patient ID: Tara Dunn, female    DOB: 1940-07-12, 77 y.o.   MRN: [id number]    Patient Care Team: Fanny Bien, MD as PCP - General (Family Medicine) Luretha Rued, RN as [place] Management  Patient Active Problem List   Diagnosis Date Noted  . Joint pain 12/09/2015  . ICD (implantable cardioverter-defibrillator) in place 03/26/2015  . Syncope 03/06/2015  . Obesity (BMI 30-39.9) 01/21/2015  . Excessive daytime sleepiness 08/04/2014  . OSA (obstructive sleep apnea) 08/04/2014  . Essential hypertension 06/13/2014  . Chronic systolic CHF (congestive heart failure) ([place]) 05/20/2014  . Congestive heart disease ([place])   . CKD (chronic kidney disease), stage III ([place]) 05/03/2014  . Hx of stroke without residual deficits 05/03/2014    Current Outpatient Medications:  .  allopurinol (ZYLOPRIM) 100 MG tablet, Take 200 mg by mouth daily. , Disp: , Rfl: 0 .  aspirin EC 81 MG tablet, Take 1 tablet (81 mg total) by mouth daily., Disp: 90 tablet, Rfl: 3 .  atorvastatin (LIPITOR) 40 MG tablet, Take 1 tablet (40 mg total) by mouth daily., Disp: 30 tablet, Rfl: 0 .  carvedilol (COREG) 25 MG tablet, Take 1 tablet (25 mg total) by mouth 2 (two) times daily., Disp: 60 tablet, Rfl: 0 .  colchicine 0.6 MG tablet, Take 0.6 mg by mouth daily., Disp: , Rfl:  .  furosemide (LASIX) 40 MG tablet, Take 1 tablet (40 mg total) by mouth daily., Disp: 90 tablet, Rfl: 3 .  isosorbide-hydrALAZINE (BIDIL) 20-37.5 MG tablet, Take 2 tablets by mouth 2 (two) times daily., Disp: 120 tablet, Rfl: 11 .  omeprazole (PRILOSEC) 40 MG capsule, Take 40 mg by mouth daily., Disp: , Rfl: 0 .  sacubitril-valsartan (ENTRESTO) 97-103 MG, Take 1 tablet by mouth 2 (two) times daily., Disp: 60 tablet, Rfl: 3 .  spironolactone (ALDACTONE) 25 MG tablet, Take 1 tablet (25 mg total) by mouth daily., Disp: 30 tablet, Rfl: 3 .  gabapentin (NEURONTIN) 300 MG capsule, Take 300 mg by  mouth 3 (three) times daily., Disp: , Rfl:  .  naproxen sodium (ANAPROX) 220 MG tablet, Take 220 mg by mouth daily as needed (back pain). Reported on 08/26/2015, Disp: , Rfl:  Allergies  Allergen Reactions  . Shellfish Allergy Hives     Social History   Socioeconomic History  . Marital status: Widowed    Spouse name: Not on file  . Number of children: Not on file  . Years of education: Not on file  . Highest education level: Not on file  Social Needs  . Financial resource strain: Not on file  . Food insecurity - worry: Not on file  . Food insecurity - inability: Not on file  . Transportation needs - medical: Not on file  . Transportation needs - non-medical: Not on file  Occupational History  . Not on file  Tobacco Use  . Smoking status: Former Smoker    Packs/day: 0.50    Years: 40.00    Pack years: 20.00    Types: Cigarettes  . Smokeless tobacco: Never Used  . Tobacco comment: Quit in 2009.  Substance and Sexual Activity  . Alcohol use: No    Alcohol/week: 0.0 oz    Comment: "drank occasionally when I was young; last drink was in the late 1990s"  . Drug use: No  . Sexual activity: No  Other Topics Concern  . Not on file  Social History Narrative   Lives  alone in an independent living facility.  Education: 10th grade.     Retired from [employer] work.  Moved here from [place] in 2015.    Physical Exam  Pulmonary/Chest: No respiratory distress. She has no wheezes.  Abdominal: She exhibits no distension. There is no tenderness.  Musculoskeletal: She exhibits no edema.  Skin: Skin is warm and dry. She is not diaphoretic.        Future Appointments  Date Time Provider [place]  05/18/2017  7:25 AM [facility] DEVICE REMOTES [facility]  06/20/2017  9:30 AM [facility] PA/NP [facility] None    ATF [name] x4 sitting in the recliner at her daughters house. Pt decided to stay with her daughter until next week due to her recent hospital stay. Pt is unable to get  around the house without assistance.  Pt denies pain today.  Pt stated that she is willing to go to a rehab facility to regain her strength.  Pt vitals noted. rx bottles verified and pill box refilled.    BP 130/80 (BP Location: Right Arm, Patient Position: Sitting, Cuff Size: Large)   Pulse 68   Resp 16   Wt 221 lb (100.2 kg)   SpO2 98%   BMI 39.15 kg/m    Courtnay Petrilla, EMT Paramedic 04/15/2017    ACTION: Home visit completed

## 2017-04-16 ENCOUNTER — Telehealth: Payer: Self-pay | Admitting: Cardiology

## 2017-04-16 DIAGNOSIS — K219 Gastro-esophageal reflux disease without esophagitis: Secondary | ICD-10-CM | POA: Diagnosis not present

## 2017-04-16 DIAGNOSIS — I130 Hypertensive heart and chronic kidney disease with heart failure and stage 1 through stage 4 chronic kidney disease, or unspecified chronic kidney disease: Secondary | ICD-10-CM | POA: Diagnosis not present

## 2017-04-16 DIAGNOSIS — Z95810 Presence of automatic (implantable) cardiac defibrillator: Secondary | ICD-10-CM | POA: Diagnosis not present

## 2017-04-16 DIAGNOSIS — Z8711 Personal history of peptic ulcer disease: Secondary | ICD-10-CM | POA: Diagnosis not present

## 2017-04-16 DIAGNOSIS — I5022 Chronic systolic (congestive) heart failure: Secondary | ICD-10-CM | POA: Diagnosis not present

## 2017-04-16 DIAGNOSIS — Z87891 Personal history of nicotine dependence: Secondary | ICD-10-CM | POA: Diagnosis not present

## 2017-04-16 DIAGNOSIS — I2510 Atherosclerotic heart disease of native coronary artery without angina pectoris: Secondary | ICD-10-CM | POA: Diagnosis not present

## 2017-04-16 DIAGNOSIS — N183 Chronic kidney disease, stage 3 (moderate): Secondary | ICD-10-CM | POA: Diagnosis not present

## 2017-04-16 DIAGNOSIS — Z5181 Encounter for therapeutic drug level monitoring: Secondary | ICD-10-CM | POA: Diagnosis not present

## 2017-04-16 DIAGNOSIS — Z9181 History of falling: Secondary | ICD-10-CM | POA: Diagnosis not present

## 2017-04-16 DIAGNOSIS — Z853 Personal history of malignant neoplasm of breast: Secondary | ICD-10-CM | POA: Diagnosis not present

## 2017-04-16 DIAGNOSIS — Z7982 Long term (current) use of aspirin: Secondary | ICD-10-CM | POA: Diagnosis not present

## 2017-04-16 DIAGNOSIS — Z6830 Body mass index (BMI) 30.0-30.9, adult: Secondary | ICD-10-CM | POA: Diagnosis not present

## 2017-04-16 DIAGNOSIS — E669 Obesity, unspecified: Secondary | ICD-10-CM | POA: Diagnosis not present

## 2017-04-16 DIAGNOSIS — I429 Cardiomyopathy, unspecified: Secondary | ICD-10-CM | POA: Diagnosis not present

## 2017-04-16 DIAGNOSIS — Z8673 Personal history of transient ischemic attack (TIA), and cerebral infarction without residual deficits: Secondary | ICD-10-CM | POA: Diagnosis not present

## 2017-04-16 DIAGNOSIS — Z9012 Acquired absence of left breast and nipple: Secondary | ICD-10-CM | POA: Diagnosis not present

## 2017-04-16 DIAGNOSIS — G4733 Obstructive sleep apnea (adult) (pediatric): Secondary | ICD-10-CM | POA: Diagnosis not present

## 2017-04-16 NOTE — Telephone Encounter (Signed)
Received a call from [facility] RN regarding patient. Reports going out to provide [facility] services regarding CHF symptoms and education. On arrival they report patient is extremely weak, unable to even stand. Currently living at home alone with some assistance but does not appear to be a safe environment for her alone given safety issues. Calling for advice. I agreed that she warranted further evaluation for this weakness and possibly needs placement in rehab facility. RN agreed and thanked me for follow up call back.   Reino Bellis NP

## 2017-04-18 ENCOUNTER — Emergency Department (HOSPITAL_COMMUNITY): Payer: PPO

## 2017-04-18 ENCOUNTER — Encounter (HOSPITAL_COMMUNITY): Payer: Self-pay

## 2017-04-18 ENCOUNTER — Other Ambulatory Visit: Payer: Self-pay

## 2017-04-18 DIAGNOSIS — I6789 Other cerebrovascular disease: Secondary | ICD-10-CM | POA: Diagnosis present

## 2017-04-18 DIAGNOSIS — I63312 Cerebral infarction due to thrombosis of left middle cerebral artery: Secondary | ICD-10-CM | POA: Diagnosis not present

## 2017-04-18 DIAGNOSIS — R609 Edema, unspecified: Secondary | ICD-10-CM | POA: Diagnosis not present

## 2017-04-18 DIAGNOSIS — Z9012 Acquired absence of left breast and nipple: Secondary | ICD-10-CM

## 2017-04-18 DIAGNOSIS — Z87891 Personal history of nicotine dependence: Secondary | ICD-10-CM

## 2017-04-18 DIAGNOSIS — R0989 Other specified symptoms and signs involving the circulatory and respiratory systems: Secondary | ICD-10-CM | POA: Diagnosis not present

## 2017-04-18 DIAGNOSIS — I130 Hypertensive heart and chronic kidney disease with heart failure and stage 1 through stage 4 chronic kidney disease, or unspecified chronic kidney disease: Secondary | ICD-10-CM | POA: Diagnosis present

## 2017-04-18 DIAGNOSIS — M21969 Unspecified acquired deformity of unspecified lower leg: Secondary | ICD-10-CM | POA: Diagnosis present

## 2017-04-18 DIAGNOSIS — E669 Obesity, unspecified: Secondary | ICD-10-CM | POA: Diagnosis not present

## 2017-04-18 DIAGNOSIS — I10 Essential (primary) hypertension: Secondary | ICD-10-CM | POA: Diagnosis not present

## 2017-04-18 DIAGNOSIS — Z8249 Family history of ischemic heart disease and other diseases of the circulatory system: Secondary | ICD-10-CM

## 2017-04-18 DIAGNOSIS — E785 Hyperlipidemia, unspecified: Secondary | ICD-10-CM | POA: Diagnosis present

## 2017-04-18 DIAGNOSIS — I671 Cerebral aneurysm, nonruptured: Secondary | ICD-10-CM | POA: Diagnosis present

## 2017-04-18 DIAGNOSIS — K219 Gastro-esophageal reflux disease without esophagitis: Secondary | ICD-10-CM | POA: Diagnosis present

## 2017-04-18 DIAGNOSIS — R29898 Other symptoms and signs involving the musculoskeletal system: Secondary | ICD-10-CM | POA: Diagnosis not present

## 2017-04-18 DIAGNOSIS — G8191 Hemiplegia, unspecified affecting right dominant side: Secondary | ICD-10-CM | POA: Diagnosis present

## 2017-04-18 DIAGNOSIS — M48061 Spinal stenosis, lumbar region without neurogenic claudication: Secondary | ICD-10-CM | POA: Diagnosis present

## 2017-04-18 DIAGNOSIS — I454 Nonspecific intraventricular block: Secondary | ICD-10-CM | POA: Diagnosis not present

## 2017-04-18 DIAGNOSIS — E46 Unspecified protein-calorie malnutrition: Secondary | ICD-10-CM | POA: Diagnosis not present

## 2017-04-18 DIAGNOSIS — G4733 Obstructive sleep apnea (adult) (pediatric): Secondary | ICD-10-CM | POA: Diagnosis present

## 2017-04-18 DIAGNOSIS — Z95810 Presence of automatic (implantable) cardiac defibrillator: Secondary | ICD-10-CM

## 2017-04-18 DIAGNOSIS — R404 Transient alteration of awareness: Secondary | ICD-10-CM | POA: Diagnosis not present

## 2017-04-18 DIAGNOSIS — R471 Dysarthria and anarthria: Secondary | ICD-10-CM | POA: Diagnosis not present

## 2017-04-18 DIAGNOSIS — Z6837 Body mass index (BMI) 37.0-37.9, adult: Secondary | ICD-10-CM | POA: Diagnosis not present

## 2017-04-18 DIAGNOSIS — M109 Gout, unspecified: Secondary | ICD-10-CM | POA: Diagnosis present

## 2017-04-18 DIAGNOSIS — M4690 Unspecified inflammatory spondylopathy, site unspecified: Secondary | ICD-10-CM | POA: Diagnosis present

## 2017-04-18 DIAGNOSIS — I5042 Chronic combined systolic (congestive) and diastolic (congestive) heart failure: Secondary | ICD-10-CM | POA: Diagnosis not present

## 2017-04-18 DIAGNOSIS — I6381 Other cerebral infarction due to occlusion or stenosis of small artery: Principal | ICD-10-CM | POA: Diagnosis present

## 2017-04-18 DIAGNOSIS — Z7982 Long term (current) use of aspirin: Secondary | ICD-10-CM

## 2017-04-18 DIAGNOSIS — N179 Acute kidney failure, unspecified: Secondary | ICD-10-CM | POA: Diagnosis not present

## 2017-04-18 DIAGNOSIS — N183 Chronic kidney disease, stage 3 (moderate): Secondary | ICD-10-CM | POA: Diagnosis present

## 2017-04-18 DIAGNOSIS — Z853 Personal history of malignant neoplasm of breast: Secondary | ICD-10-CM | POA: Diagnosis not present

## 2017-04-18 DIAGNOSIS — I69351 Hemiplegia and hemiparesis following cerebral infarction affecting right dominant side: Secondary | ICD-10-CM | POA: Diagnosis not present

## 2017-04-18 DIAGNOSIS — Z6838 Body mass index (BMI) 38.0-38.9, adult: Secondary | ICD-10-CM

## 2017-04-18 DIAGNOSIS — R531 Weakness: Secondary | ICD-10-CM | POA: Diagnosis not present

## 2017-04-18 DIAGNOSIS — Z8673 Personal history of transient ischemic attack (TIA), and cerebral infarction without residual deficits: Secondary | ICD-10-CM | POA: Diagnosis not present

## 2017-04-18 DIAGNOSIS — I509 Heart failure, unspecified: Secondary | ICD-10-CM | POA: Diagnosis not present

## 2017-04-18 DIAGNOSIS — R29810 Facial weakness: Secondary | ICD-10-CM | POA: Diagnosis not present

## 2017-04-18 DIAGNOSIS — R51 Headache: Secondary | ICD-10-CM | POA: Diagnosis not present

## 2017-04-18 DIAGNOSIS — N181 Chronic kidney disease, stage 1: Secondary | ICD-10-CM | POA: Diagnosis not present

## 2017-04-18 DIAGNOSIS — E441 Mild protein-calorie malnutrition: Secondary | ICD-10-CM | POA: Diagnosis not present

## 2017-04-18 DIAGNOSIS — Z923 Personal history of irradiation: Secondary | ICD-10-CM

## 2017-04-18 DIAGNOSIS — Z90710 Acquired absence of both cervix and uterus: Secondary | ICD-10-CM

## 2017-04-18 DIAGNOSIS — I440 Atrioventricular block, first degree: Secondary | ICD-10-CM | POA: Diagnosis present

## 2017-04-18 DIAGNOSIS — Z9221 Personal history of antineoplastic chemotherapy: Secondary | ICD-10-CM | POA: Diagnosis not present

## 2017-04-18 DIAGNOSIS — Z91013 Allergy to seafood: Secondary | ICD-10-CM

## 2017-04-18 DIAGNOSIS — Z6839 Body mass index (BMI) 39.0-39.9, adult: Secondary | ICD-10-CM | POA: Diagnosis not present

## 2017-04-18 DIAGNOSIS — M1991 Primary osteoarthritis, unspecified site: Secondary | ICD-10-CM | POA: Diagnosis not present

## 2017-04-18 DIAGNOSIS — I639 Cerebral infarction, unspecified: Secondary | ICD-10-CM | POA: Diagnosis not present

## 2017-04-18 DIAGNOSIS — D62 Acute posthemorrhagic anemia: Secondary | ICD-10-CM | POA: Diagnosis not present

## 2017-04-18 DIAGNOSIS — Z79899 Other long term (current) drug therapy: Secondary | ICD-10-CM | POA: Diagnosis not present

## 2017-04-18 DIAGNOSIS — I6930 Unspecified sequelae of cerebral infarction: Secondary | ICD-10-CM | POA: Diagnosis not present

## 2017-04-18 DIAGNOSIS — I429 Cardiomyopathy, unspecified: Secondary | ICD-10-CM | POA: Diagnosis not present

## 2017-04-18 DIAGNOSIS — Z8711 Personal history of peptic ulcer disease: Secondary | ICD-10-CM | POA: Diagnosis not present

## 2017-04-18 LAB — COMPREHENSIVE METABOLIC PANEL WITH GFR
ALT: 30 U/L (ref 14–54)
AST: 46 U/L — ABNORMAL HIGH (ref 15–41)
Albumin: 3.8 g/dL (ref 3.5–5.0)
Alkaline Phosphatase: 76 U/L (ref 38–126)
Anion gap: 11 (ref 5–15)
BUN: 36 mg/dL — ABNORMAL HIGH (ref 6–20)
CO2: 21 mmol/L — ABNORMAL LOW (ref 22–32)
Calcium: 10.1 mg/dL (ref 8.9–10.3)
Chloride: 105 mmol/L (ref 101–111)
Creatinine, Ser: 1.69 mg/dL — ABNORMAL HIGH (ref 0.44–1.00)
GFR calc Af Amer: 33 mL/min — ABNORMAL LOW
GFR calc non Af Amer: 28 mL/min — ABNORMAL LOW
Glucose, Bld: 92 mg/dL (ref 65–99)
Potassium: 5 mmol/L (ref 3.5–5.1)
Sodium: 137 mmol/L (ref 135–145)
Total Bilirubin: 1.1 mg/dL (ref 0.3–1.2)
Total Protein: 6.5 g/dL (ref 6.5–8.1)

## 2017-04-18 LAB — DIFFERENTIAL
Basophils Absolute: 0 K/uL (ref 0.0–0.1)
Basophils Relative: 1 %
Eosinophils Absolute: 0.1 K/uL (ref 0.0–0.7)
Eosinophils Relative: 2 %
Lymphocytes Relative: 30 %
Lymphs Abs: 1.3 K/uL (ref 0.7–4.0)
Monocytes Absolute: 0.4 K/uL (ref 0.1–1.0)
Monocytes Relative: 9 %
Neutro Abs: 2.4 K/uL (ref 1.7–7.7)
Neutrophils Relative %: 58 %
Smear Review: DECREASED

## 2017-04-18 LAB — I-STAT CHEM 8, ED
BUN: 45 mg/dL — AB (ref 6–20)
CHLORIDE: 104 mmol/L (ref 101–111)
CREATININE: 1.7 mg/dL — AB (ref 0.44–1.00)
Calcium, Ion: 1.29 mmol/L (ref 1.15–1.40)
GLUCOSE: 95 mg/dL (ref 65–99)
HCT: 36 % (ref 36.0–46.0)
Hemoglobin: 12.2 g/dL (ref 12.0–15.0)
POTASSIUM: 5.1 mmol/L (ref 3.5–5.1)
Sodium: 139 mmol/L (ref 135–145)
TCO2: 26 mmol/L (ref 22–32)

## 2017-04-18 LAB — CBC
HCT: 37.4 % (ref 36.0–46.0)
Hemoglobin: 12 g/dL (ref 12.0–15.0)
MCH: 29.3 pg (ref 26.0–34.0)
MCHC: 32.1 g/dL (ref 30.0–36.0)
MCV: 91.4 fL (ref 78.0–100.0)
Platelets: ADEQUATE K/uL (ref 150–400)
RBC: 4.09 MIL/uL (ref 3.87–5.11)
RDW: 17 % — ABNORMAL HIGH (ref 11.5–15.5)
WBC: 4.2 K/uL (ref 4.0–10.5)

## 2017-04-18 LAB — PROTIME-INR
INR: 1.02
Prothrombin Time: 13.3 seconds (ref 11.4–15.2)

## 2017-04-18 LAB — APTT: aPTT: 22 seconds — ABNORMAL LOW (ref 24–36)

## 2017-04-18 LAB — I-STAT TROPONIN, ED: Troponin i, poc: 0 ng/mL (ref 0.00–0.08)

## 2017-04-18 NOTE — Patient Outreach (Signed)
[facility]) Care Management  04/18/2017  Tara Dunn Jun 14, 1940 [id number]  Assessment: 77 year old with history of CKD,  Heart failure, stroke, ICD, OSA.  Referral received 04/15/17 post hospital emergency room visit. RNCM called [phone] and received a voicemail stating messages full. RNCM called listed number Tara Dunn who answered the phone. When RNCM asked to speak with client, she stated, "we are at the doctor's office. I will call you back".  Plan: await return call and continue to follow.  Tara Silversmith, RN, MSN, [place] Coordinator Cell: [phone]

## 2017-04-18 NOTE — ED Triage Notes (Signed)
[facility]- pt coming from home with complaint of right leg weakness X5 days. Pt has hx of previous stroke with slurred speech deficit. Pt also has some right arm numbness.

## 2017-04-19 ENCOUNTER — Observation Stay (HOSPITAL_COMMUNITY): Payer: PPO

## 2017-04-19 ENCOUNTER — Encounter (HOSPITAL_COMMUNITY): Payer: Self-pay | Admitting: Family Medicine

## 2017-04-19 ENCOUNTER — Observation Stay: Payer: Self-pay

## 2017-04-19 ENCOUNTER — Inpatient Hospital Stay (HOSPITAL_COMMUNITY)
Admission: EM | Admit: 2017-04-19 | Discharge: 2017-04-22 | DRG: 065 | Disposition: A | Discharge status: Rehabilitation Facility | Payer: PPO | Attending: Internal Medicine | Admitting: Internal Medicine

## 2017-04-19 ENCOUNTER — Telehealth: Payer: Self-pay | Admitting: *Deleted

## 2017-04-19 DIAGNOSIS — I63312 Cerebral infarction due to thrombosis of left middle cerebral artery: Secondary | ICD-10-CM | POA: Diagnosis not present

## 2017-04-19 DIAGNOSIS — R29898 Other symptoms and signs involving the musculoskeletal system: Secondary | ICD-10-CM | POA: Diagnosis present

## 2017-04-19 DIAGNOSIS — I6381 Other cerebral infarction due to occlusion or stenosis of small artery: Secondary | ICD-10-CM

## 2017-04-19 DIAGNOSIS — I10 Essential (primary) hypertension: Secondary | ICD-10-CM | POA: Diagnosis not present

## 2017-04-19 DIAGNOSIS — N179 Acute kidney failure, unspecified: Secondary | ICD-10-CM

## 2017-04-19 DIAGNOSIS — R531 Weakness: Secondary | ICD-10-CM

## 2017-04-19 DIAGNOSIS — N183 Chronic kidney disease, stage 3 unspecified: Secondary | ICD-10-CM

## 2017-04-19 DIAGNOSIS — I5042 Chronic combined systolic (congestive) and diastolic (congestive) heart failure: Secondary | ICD-10-CM

## 2017-04-19 DIAGNOSIS — Z95810 Presence of automatic (implantable) cardiac defibrillator: Secondary | ICD-10-CM | POA: Diagnosis present

## 2017-04-19 DIAGNOSIS — I639 Cerebral infarction, unspecified: Secondary | ICD-10-CM

## 2017-04-19 DIAGNOSIS — Z8673 Personal history of transient ischemic attack (TIA), and cerebral infarction without residual deficits: Secondary | ICD-10-CM

## 2017-04-19 DIAGNOSIS — G4733 Obstructive sleep apnea (adult) (pediatric): Secondary | ICD-10-CM | POA: Diagnosis present

## 2017-04-19 DIAGNOSIS — R600 Localized edema: Secondary | ICD-10-CM

## 2017-04-19 DIAGNOSIS — R609 Edema, unspecified: Secondary | ICD-10-CM

## 2017-04-19 DIAGNOSIS — M48061 Spinal stenosis, lumbar region without neurogenic claudication: Secondary | ICD-10-CM | POA: Diagnosis not present

## 2017-04-19 DIAGNOSIS — I6930 Unspecified sequelae of cerebral infarction: Secondary | ICD-10-CM

## 2017-04-19 DIAGNOSIS — G8191 Hemiplegia, unspecified affecting right dominant side: Secondary | ICD-10-CM

## 2017-04-19 LAB — CBG MONITORING, ED: Glucose-Capillary: 74 mg/dL (ref 65–99)

## 2017-04-19 LAB — LIPID PANEL
CHOL/HDL RATIO: 2.1 ratio
CHOLESTEROL: 102 mg/dL (ref 0–200)
HDL: 48 mg/dL (ref >40)
LDL Cholesterol: 42 mg/dL (ref 0–99)
Triglycerides: 59 mg/dL (ref <150)
VLDL: 12 mg/dL (ref 0–40)

## 2017-04-19 LAB — CK: CK TOTAL: 370 U/L — AB (ref 38–234)

## 2017-04-19 LAB — SEDIMENTATION RATE: SED RATE: 7 mm/h (ref 0–22)

## 2017-04-19 LAB — HEMOGLOBIN A1C
HEMOGLOBIN A1C: 6.1 % — AB (ref 4.8–5.6)
MEAN PLASMA GLUCOSE: 128.37 mg/dL

## 2017-04-19 LAB — VITAMIN B12: VITAMIN B 12: 454 pg/mL (ref 180–914)

## 2017-04-19 LAB — C-REACTIVE PROTEIN

## 2017-04-19 LAB — TSH: TSH: 3.102 u[IU]/mL (ref 0.350–4.500)

## 2017-04-19 MED ORDER — GABAPENTIN 300 MG PO CAPS
300.0000 mg | ORAL_CAPSULE | Freq: Three times a day (TID) | ORAL | Status: DC
  Administered 2017-04-19 – 2017-04-22 (×10): 300 mg via ORAL
  Filled 2017-04-19 (×10): qty 1

## 2017-04-19 MED ORDER — PANTOPRAZOLE SODIUM 40 MG PO TBEC
40.0000 mg | DELAYED_RELEASE_TABLET | Freq: Every day | ORAL | Status: DC
  Administered 2017-04-19 – 2017-04-22 (×4): 40 mg via ORAL
  Filled 2017-04-19 (×4): qty 1

## 2017-04-19 MED ORDER — SPIRONOLACTONE 25 MG PO TABS
25.0000 mg | ORAL_TABLET | Freq: Every day | ORAL | Status: DC
  Administered 2017-04-19 – 2017-04-21 (×3): 25 mg via ORAL
  Filled 2017-04-19 (×3): qty 1

## 2017-04-19 MED ORDER — SENNOSIDES-DOCUSATE SODIUM 8.6-50 MG PO TABS: 1.0000 | ORAL_TABLET | Freq: Every evening | ORAL | Status: DC | PRN

## 2017-04-19 MED ORDER — ACETAMINOPHEN 650 MG RE SUPP: 650.0000 mg | RECTAL | Status: DC | PRN

## 2017-04-19 MED ORDER — ATORVASTATIN CALCIUM 40 MG PO TABS
40.0000 mg | ORAL_TABLET | Freq: Every day | ORAL | Status: DC
  Administered 2017-04-19: 40 mg via ORAL
  Filled 2017-04-19: qty 1

## 2017-04-19 MED ORDER — ASPIRIN EC 81 MG PO TBEC
81.0000 mg | DELAYED_RELEASE_TABLET | Freq: Every day | ORAL | Status: DC
  Administered 2017-04-19: 81 mg via ORAL
  Filled 2017-04-19: qty 1

## 2017-04-19 MED ORDER — ACETAMINOPHEN 325 MG PO TABS: 650.0000 mg | ORAL_TABLET | ORAL | Status: DC | PRN

## 2017-04-19 MED ORDER — ALLOPURINOL 100 MG PO TABS
200.0000 mg | ORAL_TABLET | Freq: Every day | ORAL | Status: DC
  Administered 2017-04-19 – 2017-04-22 (×4): 200 mg via ORAL
  Filled 2017-04-19 (×4): qty 2

## 2017-04-19 MED ORDER — SACUBITRIL-VALSARTAN 97-103 MG PO TABS
1.0000 | ORAL_TABLET | Freq: Two times a day (BID) | ORAL | Status: DC
  Administered 2017-04-19 – 2017-04-21 (×4): 1 via ORAL
  Filled 2017-04-19 (×8): qty 1

## 2017-04-19 MED ORDER — FUROSEMIDE 40 MG PO TABS
40.0000 mg | ORAL_TABLET | Freq: Every day | ORAL | Status: DC
  Administered 2017-04-19: 40 mg via ORAL
  Filled 2017-04-19 (×2): qty 1

## 2017-04-19 MED ORDER — ACETAMINOPHEN 160 MG/5ML PO SOLN: 650.0000 mg | ORAL | Status: DC | PRN

## 2017-04-19 MED ORDER — ISOSORB DINITRATE-HYDRALAZINE 20-37.5 MG PO TABS
2.0000 | ORAL_TABLET | Freq: Two times a day (BID) | ORAL | Status: DC
  Administered 2017-04-19 – 2017-04-21 (×4): 2 via ORAL
  Filled 2017-04-19 (×5): qty 2

## 2017-04-19 MED ORDER — CARVEDILOL 12.5 MG PO TABS
25.0000 mg | ORAL_TABLET | Freq: Two times a day (BID) | ORAL | Status: DC
  Administered 2017-04-19 – 2017-04-22 (×4): 25 mg via ORAL
  Filled 2017-04-19 (×4): qty 2

## 2017-04-19 MED ORDER — ATORVASTATIN CALCIUM 80 MG PO TABS
80.0000 mg | ORAL_TABLET | Freq: Every day | ORAL | Status: DC
  Administered 2017-04-20 – 2017-04-21 (×2): 80 mg via ORAL
  Filled 2017-04-19 (×2): qty 1

## 2017-04-19 MED ORDER — STROKE: EARLY STAGES OF RECOVERY BOOK
Freq: Once | Status: DC
  Filled 2017-04-19: qty 1

## 2017-04-19 MED ORDER — ASPIRIN EC 325 MG PO TBEC
325.0000 mg | DELAYED_RELEASE_TABLET | Freq: Every day | ORAL | Status: DC
  Administered 2017-04-20: 325 mg via ORAL
  Filled 2017-04-19: qty 1

## 2017-04-19 NOTE — Progress Notes (Signed)
PT Cancellation Note  Patient Details Name: Tara Dunn MRN: [id number] DOB: 10-Feb-1940   Cancelled Treatment:    Reason Eval/Treat Not Completed: Patient at procedure or test/unavailable.  Had MRI and will try later as time and pt allow.   Ramond Dial 04/19/2017, 2:10 PM   Mee Hives, PT MS Acute Rehab Dept. Number: [place] and [place]

## 2017-04-19 NOTE — ED Notes (Signed)
[place], MD at bedside 

## 2017-04-19 NOTE — ED Notes (Addendum)
CBG 74  

## 2017-04-19 NOTE — ED Notes (Signed)
ED Provider at bedside. 

## 2017-04-19 NOTE — ED Notes (Signed)
Attempted to call report

## 2017-04-19 NOTE — Progress Notes (Signed)
PROGRESS NOTE  Tara Dunn OFH:[id number] DOB: 03-18-1940 DOA: 04/19/2017 PCP: Fanny Bien, MD  HPI/Recap of past 24 hours:  Tara Dunn is a 77 y.o. year old female with medical history significant for combined systolic/diastolic CHF, CKD stage III, HTN, history of CVA who presented on 04/19/2017 with several weeks of progressively worsening right lower leg weakness with concern for possible new CVA.  At baseline (per daughter close (, patient able to ambulate with walker and minimal assistance.  However, approximately 1 month ago daughter noticed her mother requires more assistance moving her right leg while getting into the car.  Last Thursday, daughter reports even worsening right leg weakness with patient being unable to get up from her bed without any support.  Patient was evaluated by her PCP earlier this week who recommended ED evaluation.  Interval History Patient reports worsening right-sided weakness for several weeks.  Was unable to get up on her own starting last Thursday.  Daughter corroborates story over the phone this morning.  She brought her mother to her home in hopes that that would improve her strength.  She was evaluated by her PCP who recommended ED evaluation.  Of note patient states no recent sick contacts, no falls.  She did have a bout of diarrhea a few weeks prior.  No changes in medications.  Assessment/Plan: Principal Problem:   Right leg weakness Active Problems:   Chronic combined systolic and diastolic CHF (congestive heart failure) (HCC)   CKD (chronic kidney disease), stage III (HCC)   Hx of stroke without residual deficits   Essential hypertension   OSA (obstructive sleep apnea)   ICD (implantable cardioverter-defibrillator) in place   Right sided weakness   #Progressive right-sided weakness in patient with remote history of CVA -Progressive right-sided weakness mainly in the right leg over the past month but worse within the past  week with patient unable to walk on her own anymore (previously used walker and minimal assistance), confirmed by daughter -CT head shows chronic ischemic changes with no acute abnormalities line-TSH, B12, ESR, CRP within normal limits -Exam consistent with proximal right leg muscle weakness.  Given chronicity neurology thinks less likely acute stroke; differential includes myopathy, radiculopathy, arthritis, stroke - Plan to obtain MRI brain and lumbar spine as long as ICD is MRI compatible -Check TTE and carotid Dopplers -Neurochecks -PT/OT evaluation -EMG/nerve conduction study as outpatient if negative -Continue home aspirin and Lipitor  #Chronic combined systolic and diastolic CHF s/p ICD, normal volume status -Continue home Lasix, Coreg, BiDil, Entresto, Aldactone  -Telemetry - Intake and output, daily weights  #Hypertension, at goal -Continue home Entresto, BiDil, Coreg  #CKD, stage III, stable - Baseline creatinine 1.6-2.1 -Continue to monitor on BMP   Code Status: Full  Family Communication: Updated daughter on phone  Disposition Plan: Follow-up MRI imaging as mentioned above   Consultants:  Neurology  Procedures:  pending TTE  Antimicrobials:  None  Cultures:  None  Telemetry: Yes  DVT prophylaxis: SCDs   Objective: Vitals:   04/19/17 0615 04/19/17 0645 04/19/17 0800 04/19/17 1157  BP: 137/82 (!) 143/71 (!) 152/84 (!) 106/55  Pulse: 76 75 91 76  Resp: 14 (!) _0 Temp:   98 F (36.7 C) 98.3 F (36.8 C)  TempSrc:   Oral Oral  SpO2: 100% 100% 100% 100%   No intake or output data in the 24 hours ending 04/19/17 1225 There were no vitals filed for this visit.  Exam:  General: Comfortably lying  in bed, no acute distress Eyes: EOMI, anicteric ENT: Oral Mucosa clear and moist Cardiovascular: regular rate and rhythm, no murmurs, rubs or gallops, no edema Respiratory: Normal respiratory effort on room air, lungs clear to auscultation  bilaterally Abdomen: soft, non-distended, non-tender, normal bowel sounds Skin: No Rash Musculoskeletal:Good ROM, no contractures. Normal muscle tone Neurologic: Unable to lift right leg off the bed.  4/5 strength in left leg.  Right facial droop.  Sensation to light touch intact throughout Psychiatric:Appropriate affect, and mood  Data Reviewed: CBC: Recent Labs  Lab 04/14/17 1550 04/18/17 1755 04/18/17 1800  WBC 3.9* 4.2  --   NEUTROABS 2.2 2.4  --   HGB 11.0* 12.0 12.2  HCT 34.1* 37.4 36.0  MCV 91.4 91.4  --   PLT 135* PLATELETS APPEAR ADEQUATE  --    Basic Metabolic Panel: Recent Labs  Lab 04/14/17 1550 04/18/17 1755 04/18/17 1800  NA 141 137 139  K 4.2 5.0 5.1  CL 109 105 104  CO2 22 21*  --   GLUCOSE 92 92 95  BUN 41* 36* 45*  CREATININE 1.64* 1.69* 1.70*  CALCIUM 9.4 10.1  --    GFR: Estimated Creatinine Clearance: 31.3 mL/min (A) (by C-G formula based on SCr of 1.7 mg/dL (H)). Liver Function Tests: Recent Labs  Lab 04/14/17 1550 04/18/17 1755  AST 56* 46*  ALT 42 30  ALKPHOS 78 76  BILITOT 0.5 1.1  PROT 5.7* 6.5  ALBUMIN 3.7 3.8   No results for input(s): LIPASE, AMYLASE in the last 168 hours. No results for input(s): AMMONIA in the last 168 hours. Coagulation Profile: Recent Labs  Lab 04/18/17 1755  INR 1.02   Cardiac Enzymes: Recent Labs  Lab 04/19/17 0938  CKTOTAL 370*   BNP (last 3 results) No results for input(s): PROBNP in the last 8760 hours. HbA1C: Recent Labs    04/19/17 0424  HGBA1C 6.1*   CBG: Recent Labs  Lab 04/19/17 0222  GLUCAP 67   Lipid Profile: Recent Labs    04/19/17 0424  CHOL 102  HDL 48  LDLCALC 42  TRIG 59  CHOLHDL 2.1   Thyroid Function Tests: Recent Labs    04/19/17 0930  TSH 3.102   Anemia Panel: Recent Labs    04/19/17 0938  VITAMINB12 454   Urine analysis:    Component Value Date/Time   COLORURINE YELLOW 03/16/2017 1937   APPEARANCEUR CLEAR 03/16/2017 1937   LABSPEC 1.011  03/16/2017 1937   PHURINE 5.0 03/16/2017 1937   GLUCOSEU NEGATIVE 03/16/2017 1937   HGBUR NEGATIVE 03/16/2017 1937   BILIRUBINUR NEGATIVE 03/16/2017 [place] NEGATIVE 03/16/2017 1937   PROTEINUR NEGATIVE 03/16/2017 1937   NITRITE NEGATIVE 03/16/2017 1937   LEUKOCYTESUR NEGATIVE 03/16/2017 1937   Sepsis Labs: _0 (procalcitonin:4,lacticidven:4)  )No results found for this or any previous visit (from the past 240 hour(s)).    Studies: Ct Head Wo Contrast  Result Date: 04/18/2017 CLINICAL DATA:  Right leg weakness EXAM: CT HEAD WITHOUT CONTRAST TECHNIQUE: Contiguous axial images were obtained from the base of the skull through the vertex without intravenous contrast. COMPARISON:  04/14/2017 FINDINGS: Brain: Mild atrophic changes are noted. Chronic white matter ischemic changes are seen. A focal lacunar infarct is noted in the right cerebellar hemisphere stable from the previous exam. No acute hemorrhage or acute infarction is seen. Vascular: No hyperdense vessel or unexpected calcification. Skull: Normal. Negative for fracture or focal lesion. Sinuses/Orbits: No acute finding. Other: None. IMPRESSION: Chronic atrophic and ischemic changes  without acute abnormality. Electronically Signed   By: Inez Catalina M.D.   On: 04/18/2017 17:34    Scheduled Meds: .  stroke: mapping our early stages of recovery book   Does not apply Once  . allopurinol  200 mg Oral Daily  . aspirin EC  81 mg Oral Daily  . atorvastatin  40 mg Oral q1800  . carvedilol  25 mg Oral BID WC  . furosemide  40 mg Oral Daily  . gabapentin  300 mg Oral TID  . isosorbide-hydrALAZINE  2 tablet Oral BID  . pantoprazole  40 mg Oral Daily  . sacubitril-valsartan  1 tablet Oral BID  . spironolactone  25 mg Oral Daily    Continuous Infusions:   LOS: 0 days     Desiree Hane, MD [facility] Pager [phone]  If 7PM-7AM, please contact night-coverage [email or web address] Password [facility] 04/19/2017, 12:25  PM

## 2017-04-19 NOTE — Telephone Encounter (Signed)
-----   Message from [place], RN sent at 04/11/2017  9:46 AM EST ----- Regarding: 2 month d/l  Patient advised to f/u with [facility] for instructions on fixing her cpap. Once she has started her cpap she will get a 2 month D/L.   Thanks  [employer]

## 2017-04-19 NOTE — Progress Notes (Signed)
Patient left unit for MRI. RN Anderson Malta R) with patient.   Ave Filter, RN

## 2017-04-19 NOTE — ED Notes (Signed)
IV team unsuccessful with several attempts. Charge, RN notified.

## 2017-04-19 NOTE — Consult Note (Signed)
Requesting Physician: Dr. Christy Gentles    Chief Complaint: Right leg weakness  History obtained from: Patient and Chart    HPI:                                                                                                                                       Tara Dunn is an 77 y.o. female with past medical history of breast cancer, cardiomyopathy status post ICD, systolic heart failure, CKD,pior CVA, hypertension presents with progressive right leg weakness since the last few months, however became much worse last Wednesday and seen in [facility]. They felt patient has chronic bilateral leg weakness and she was discharged with home health services. Since then she has continued have weakness in the right leg and unable to ambulate and family decided to bring her back to the emergency room. CT head was obtained which showed no acute stroke, chronic microvascular disease. Patient also has left leg weakness which is chronic over several years.  Neurology was consulted for evaluation of right leg weakness.   Past Medical History:  Diagnosis Date  . 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 Lovena Le  . Aortic dilatation ([place])    a. 04/2014 CTA chest w/ incidental finding of distal thoracic Ao enlargement of 3.39 mm - f/u needed 04/2015.  . Arthritis    "all over my body"  . Cancer of left breast ([place]) 2009   s/p L mastectomy and chemo.  . Cardiomyopathy ([place])    a. 04/2014 Echo: EF 25-30%, mod conc LVH, possibl antsept HK, Gr1 DD, mod-sev dil LA.  . CHF (congestive heart failure) ([place])   . CKD (chronic kidney disease), stage III ([place])   . Depression   . GERD (gastroesophageal reflux disease)   . Gout   . Heart murmur   . History of stomach ulcers   . Hypertension    a. Dx @ age 49;  b. 04/2014 admission for HTN emergency.  . Obesity (BMI 30-39.9) 01/21/2015  . OSA on CPAP   . Stroke [facility])    a. 3 strokes - last in 1998; "memory problems since"  (03/26/2015)     Past Surgical History:  Procedure Laterality Date  . ABDOMINAL HYSTERECTOMY    . BREAST BIOPSY Left 2008  . CATARACT EXTRACTION, BILATERAL Bilateral   . DILATION AND CURETTAGE OF UTERUS    . EP IMPLANTABLE DEVICE N/A 03/26/2015   MDT single chamber ICD, Dr. Lovena Le  . LEFT HEART CATHETERIZATION WITH CORONARY ANGIOGRAM N/A 05/07/2014   Procedure: LEFT HEART CATHETERIZATION WITH CORONARY ANGIOGRAM;  Surgeon: Belva Crome, MD;  Location: [facility] CATH LAB;  Service: Cardiovascular;  Laterality: N/A;  . MASTECTOMY Left 2009    Family History  Problem Relation Age of Onset  . High blood pressure Mother        Died @ 48.  . High blood pressure Father   . Heart  attack Father        Died in his early 59's.  . High blood pressure Sister   . Cancer Sister   . Cancer Daughter   . Heart disease Daughter    Social History:  reports that she has quit smoking. Her smoking use included cigarettes. She has a 20.00 pack-year smoking history. she has never used smokeless tobacco. She reports that she does not drink alcohol or use drugs.  Allergies:  Allergies  Allergen Reactions  . Shellfish Allergy Hives    Medications:                                                                                                                        I reviewed home medications   ROS:                                                                                                                                     14 systems reviewed and negative except above    Examination:                                                                                                      General: Appears well-developed and well-nourished.  Psych: Affect appropriate to situation Eyes: No scleral injection HENT: No OP obstrucion Head: Normocephalic.  Cardiovascular: Normal rate and regular rhythm.  Respiratory: Effort normal and breath sounds normal to anterior ascultation GI: Soft.  No distension. There is no  tenderness.  Skin: WDI   Neurological Examination  Mental Status: Alert, oriented, thought content appropriate.  Speech fluent without evidence of aphasia.  Able to follow 3 step commands without difficulty. Cranial Nerves: II: visual fields grossly normal, pupils equal, round, reactive to light and accommodation III,IV, VI: ptosis not present, extraocular muscles extra-ocular motions intact bilaterally V,VII: mild right facial droop VIII: hearing normal bilaterally IX,X: gag reflex present XI: trapezius strength/neck flexion strength normal bilaterally XII: tongue strength normal  Motor: Right : Upper extremity  Left:     Upper extremity 5/5 deltoid       5/5 deltoid 5/5 tricep      5/5 tricep 5/5 biceps      5/5 biceps  5/5wrist flexion     5/5 wrist flexion 5/5 wrist extension     5/5 wrist extension 5/5 hand grip      5/5 hand grip  Lower extremity     Lower extremity 3-/5 hip flexor      4/5 hip flexor 3/5 hip adductors     5/5 hip adductors 3/5 hip abductors     5/5 hip abductors 4/5 quadricep      5/5 quadriceps  4/5 hamstrings     5/5 hamstrings 5/5 plantar flexion       5/5 plantar flexion 5/5 plantar extension     5/5 plantar extension Tone and bulk:normal tone throughout; no atrophy noted Sensory: Pinprick and light touch intact throughout, bilaterally Deep Tendon Reflexes:  Right: Upper Extremity   Left: Upper extremity   biceps (C-5 to C-6) 2/4   biceps (C-5 to C-6) 2/4 tricep (C7) 2/4    triceps (C7) 2/4 Brachioradialis (C6) 2/4  Brachioradialis (C6) 2/4  Lower Extremity Lower Extremity  quadriceps (L-2 to L-4) 2/4   quadriceps (L-2 to L-4) 2/4 Achilles (S1) 1/4   Achilles (S1) 1/4 Plantars: Right: downgoing   Left: downgoing Cerebellar: normal finger-to-nose, normal rapid alternating movements and normal heel-to-shin test normal gait and station       Lab Results: Basic Metabolic Panel: Recent Labs  Lab 04/14/17 1550 04/18/17 1755  04/18/17 1800  NA 141 137 139  K 4.2 5.0 5.1  CL 109 105 104  CO2 22 21*  --   GLUCOSE 92 92 95  BUN 41* 36* 45*  CREATININE 1.64* 1.69* 1.70*  CALCIUM 9.4 10.1  --     CBC: Recent Labs  Lab 04/14/17 1550 04/18/17 1755 04/18/17 1800  WBC 3.9* 4.2  --   NEUTROABS 2.2 2.4  --   HGB 11.0* 12.0 12.2  HCT 34.1* 37.4 36.0  MCV 91.4 91.4  --   PLT 135* PLATELETS APPEAR ADEQUATE  --     Coagulation Studies: Recent Labs    04/18/17 1755  LABPROT 13.3  INR 1.02    Imaging: Ct Head Wo Contrast  Result Date: 04/18/2017 CLINICAL DATA:  Right leg weakness EXAM: CT HEAD WITHOUT CONTRAST TECHNIQUE: Contiguous axial images were obtained from the base of the skull through the vertex without intravenous contrast. COMPARISON:  04/14/2017 FINDINGS: Brain: Mild atrophic changes are noted. Chronic white matter ischemic changes are seen. A focal lacunar infarct is noted in the right cerebellar hemisphere stable from the previous exam. No acute hemorrhage or acute infarction is seen. Vascular: No hyperdense vessel or unexpected calcification. Skull: Normal. Negative for fracture or focal lesion. Sinuses/Orbits: No acute finding. Other: None. IMPRESSION: Chronic atrophic and ischemic changes without acute abnormality. Electronically Signed   By: Inez Catalina M.D.   On: 04/18/2017 17:34     ASSESSMENT AND PLAN  77 y.o. female with past medical history of breast cancer, cardiomyopathy status post ICD, systolic heart failure, CKD,pior CVA, hypertension presents with progressive right leg weakness since the last few months, worse over the last 5 days. Patient has mild left-sided weakness which is chronic. She has both paternal reflexes 2+ making a AIDP/CIDP less likely. Straight leg raising test was negative making radiculopathy also less likely. Given chronic presentation this is also less likely to  be acute stroke. She does have mild right facial droop which may be a sequel of old stroke.  Acute on  Chronic right  Leg proximal> distal muscle weakness  D/D include myopathy, radiculopathy, arthritis, stroke  Plan  MRI Brain and MRI L spine w/wo contrast if ICD is Mri compatible TSH, ESR, CRP,CK PT/OT eval EMG/NCS as outpatient if negative   Will continue to follow   [place] Pager Number [phone]

## 2017-04-19 NOTE — ED Notes (Signed)
IV team at bedside 

## 2017-04-19 NOTE — Progress Notes (Signed)
Preliminary results by tech - Carotid Duplex Completed. 1-39% stenosis in bilateral carotid arteries. Vertebral arteries are patent with antegrade flow.   Oda Cogan, BS, RDMS, RVT

## 2017-04-19 NOTE — ED Notes (Signed)
Floor coverage messaged about pt needing PICC line order.

## 2017-04-19 NOTE — Progress Notes (Signed)
1845 -2000 Attempted to place PICC in right upper arm without success. Unable to pass pacer wires. PICC kept coiling. RN made aware.Recommend IR placement if still need venous access.

## 2017-04-19 NOTE — Progress Notes (Signed)
SLP Cancellation Note  Patient Details Name: Tara Dunn MRN: [id number] DOB: 28-Dec-1940   Cancelled treatment:       Reason Eval/Treat Not Completed: Patient at procedure or test/unavailable; at MRI.   Juan Quam Laurice 04/19/2017, 2:10 PM

## 2017-04-19 NOTE — Care Management Note (Signed)
Case Management Note  Patient Details  Name: Chauntae Hults MRN: [id number] Date of Birth: January 23, 1941  Subjective/Objective:       Pt in to r/o CVA. She is from home.  Pt active with [facility] for: PT/OT/RN/SW/aide and ST.         Action/Plan: Awaiting PT/OT evals. CM following for d/c needs, physician orders.  Expected Discharge Date:                  Expected Discharge Plan:     In-House Referral:     Discharge planning Services     Post Acute Care Choice:    Choice offered to:     DME Arranged:    DME Agency:     HH Arranged:    HH Agency:     Status of Service:  In process, will continue to follow  If discussed at Long Length of Stay Meetings, dates discussed:    Additional Comments:  Pollie Friar, RN 04/19/2017, 12:49 PM

## 2017-04-19 NOTE — Progress Notes (Signed)
[facility]  Patient Status: Active (receiving services up to time of hospitalization)  [facility] is providing the following services: RN, PT and MSW  If patient discharges after hours, please call [phone].   Tara Dunn 04/19/2017, 10:08 AM

## 2017-04-19 NOTE — ED Provider Notes (Signed)
[facility] EMERGENCY DEPARTMENT Provider Note   CSN: [id number] Arrival date & time: 04/18/17  1649     History   Chief Complaint Chief Complaint  Patient presents with  . Extremity Weakness    HPI Tara Dunn is a 77 y.o. female.  The history is provided by the patient and a relative.  Extremity Weakness  This is a new problem. The current episode started more than 1 week ago. The problem occurs daily. The problem has been gradually worsening. Associated symptoms include headaches. Pertinent negatives include no chest pain and no abdominal pain. The symptoms are aggravated by walking. The symptoms are relieved by rest. She has tried nothing for the symptoms.  Patient with history of current myopathy, ICD in place, distant history of stroke presents with lower extremity weakness.  Over a week ago, she had noted that her legs were feeling weak, mostly weakness in her right leg.  She is to the point now where she needs to have help to even stand.  She is unable to walk at this time She requires significant assistance from her daughter. She reports mild headache today.  No new visual changes.  No chest pain.  No weakness in the arms is reported.  No new back pain.  No new incontinence is reported  Patient and family, she had no residual deficits from stroke.  However she has had some difficulty with speech for "a while "per daughter  Past Medical History:  Diagnosis Date  . 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 Lovena Le  . Aortic dilatation ([place])    a. 04/2014 CTA chest w/ incidental finding of distal thoracic Ao enlargement of 3.39 mm - f/u needed 04/2015.  . Arthritis    "all over my body"  . Cancer of left breast ([place]) 2009   s/p L mastectomy and chemo.  . Cardiomyopathy ([place])    a. 04/2014 Echo: EF 25-30%, mod conc LVH, possibl antsept HK, Gr1 DD, mod-sev dil LA.  . CHF (congestive heart failure) ([place])   . CKD (chronic  kidney disease), stage III ([place])   . Depression   . GERD (gastroesophageal reflux disease)   . Gout   . Heart murmur   . History of stomach ulcers   . Hypertension    a. Dx @ age 87;  b. 04/2014 admission for HTN emergency.  . Obesity (BMI 30-39.9) 01/21/2015  . OSA on CPAP   . Stroke [facility])    a. 3 strokes - last in 1998; "memory problems since"  (03/26/2015)    Patient Active Problem List   Diagnosis Date Noted  . Joint pain 12/09/2015  . ICD (implantable cardioverter-defibrillator) in place 03/26/2015  . Syncope 03/06/2015  . Obesity (BMI 30-39.9) 01/21/2015  . Excessive daytime sleepiness 08/04/2014  . OSA (obstructive sleep apnea) 08/04/2014  . Essential hypertension 06/13/2014  . Chronic systolic CHF (congestive heart failure) ([place]) 05/20/2014  . Congestive heart disease ([place])   . CKD (chronic kidney disease), stage III ([place]) 05/03/2014  . Hx of stroke without residual deficits 05/03/2014    Past Surgical History:  Procedure Laterality Date  . ABDOMINAL HYSTERECTOMY    . BREAST BIOPSY Left 2008  . CATARACT EXTRACTION, BILATERAL Bilateral   . DILATION AND CURETTAGE OF UTERUS    . EP IMPLANTABLE DEVICE N/A 03/26/2015   MDT single chamber ICD, Dr. Lovena Le  . LEFT HEART CATHETERIZATION WITH CORONARY ANGIOGRAM N/A 05/07/2014   Procedure: LEFT HEART CATHETERIZATION WITH CORONARY  Cyril Loosen;  Surgeon: Belva Crome, MD;  Location: [facility] CATH LAB;  Service: Cardiovascular;  Laterality: N/A;  . MASTECTOMY Left 2009    OB History    No data available       Home Medications    Prior to Admission medications   Medication Sig Start Date End Date Taking? Authorizing Provider  allopurinol (ZYLOPRIM) 100 MG tablet Take 200 mg by mouth daily.  03/28/14   [provider]  aspirin EC 81 MG tablet Take 1 tablet (81 mg total) by mouth daily. 05/20/14   Larey Dresser, MD  atorvastatin (LIPITOR) 40 MG tablet Take 1 tablet (40 mg total) by mouth daily. 10/26/16   Larey Dresser,  MD  carvedilol (COREG) 25 MG tablet Take 1 tablet (25 mg total) by mouth 2 (two) times daily. 10/26/16   Larey Dresser, MD  colchicine 0.6 MG tablet Take 0.6 mg by mouth daily.    [provider]  furosemide (LASIX) 40 MG tablet Take 1 tablet (40 mg total) by mouth daily. 04/14/17   Larey Dresser, MD  gabapentin (NEURONTIN) 300 MG capsule Take 300 mg by mouth 3 (three) times daily.    [provider]  isosorbide-hydrALAZINE (BIDIL) 20-37.5 MG tablet Take 2 tablets by mouth 2 (two) times daily. 03/21/17   Clegg, Amy D, NP  naproxen sodium (ANAPROX) 220 MG tablet Take 220 mg by mouth daily as needed (back pain). Reported on 08/26/2015    [provider]  omeprazole (PRILOSEC) 40 MG capsule Take 40 mg by mouth daily. 03/28/14   [provider]  sacubitril-valsartan (ENTRESTO) 97-103 MG Take 1 tablet by mouth 2 (two) times daily. 04/04/17   Larey Dresser, MD  spironolactone (ALDACTONE) 25 MG tablet Take 1 tablet (25 mg total) by mouth daily. 02/10/17   Larey Dresser, MD    Family History Family History  Problem Relation Age of Onset  . High blood pressure Mother        Died @ 50.  . High blood pressure Father   . Heart attack Father        Died in his early 36's.  . High blood pressure Sister   . Cancer Sister   . Cancer Daughter   . Heart disease Daughter     Social History Social History   Tobacco Use  . Smoking status: Former Smoker    Packs/day: 0.50    Years: 40.00    Pack years: 20.00    Types: Cigarettes  . Smokeless tobacco: Never Used  . Tobacco comment: Quit in 2009.  Substance Use Topics  . Alcohol use: No    Alcohol/week: 0.0 oz    Comment: "drank occasionally when I was young; last drink was in the late 1990s"  . Drug use: No     Allergies   Shellfish allergy   Review of Systems Review of Systems  Constitutional: Negative for fever.  Cardiovascular: Negative for chest pain.  Gastrointestinal: Negative for abdominal  pain.  Musculoskeletal: Positive for extremity weakness. Negative for back pain.  Neurological: Positive for weakness and headaches.  All other systems reviewed and are negative.    Physical Exam Updated Vital Signs BP (!) 150/114   Pulse 74   Temp 98.8 F (37.1 C)   Resp 18   SpO2 96%   Physical Exam  CONSTITUTIONAL: Elderly, no acute distress HEAD: Normocephalic/atraumatic EYES: EOMI/PERRL ENMT: Mucous membranes moist NECK: supple no meningeal signs SPINE/BACK:entire spine nontender, no bruising/crepitance/stepoffs  noted to spine CV: S1/S2 noted, no murmurs/rubs/gallops noted LUNGS: Lungs are clear to auscultation bilaterally, no apparent distress ABDOMEN: soft, nontender GU:no cva tenderness NEURO: Pt is awake/alert.  Questionable right facial droop.  Questionable dysarthria.  No arm drift.  Right leg drift noted, she is unable to resist gravity.  Left leg she is able to resist gravity.  No clonus bilaterally.  Equal reflexes in bilateral lower extremities.  No sensory deficit noted lower extremities EXTREMITIES: pulses normal/equal, full ROM SKIN: warm, color normal PSYCH: no abnormalities of mood noted, alert and oriented to situation  ED Treatments / Results  Labs (all labs ordered are listed, but only abnormal results are displayed) Labs Reviewed  APTT - Abnormal; Notable for the following components:      Result Value   aPTT 22 (*)    All other components within normal limits  CBC - Abnormal; Notable for the following components:   RDW 17.0 (*)    All other components within normal limits  COMPREHENSIVE METABOLIC PANEL - Abnormal; Notable for the following components:   CO2 21 (*)    BUN 36 (*)    Creatinine, Ser 1.69 (*)    AST 46 (*)    GFR calc non Af Amer 28 (*)    GFR calc Af Amer 33 (*)    All other components within normal limits  I-STAT CHEM 8, ED - Abnormal; Notable for the following components:   BUN 45 (*)    Creatinine, Ser 1.70 (*)    All  other components within normal limits  PROTIME-INR  DIFFERENTIAL  I-STAT TROPONIN, ED  CBG MONITORING, ED    EKG  EKG Interpretation  Date/Time:  Monday April 18 2017 17:01:09 EDT Ventricular Rate:  79 PR Interval:  212 QRS Duration: 134 QT Interval:  398 QTC Calculation: 456 R Axis:   -43 Text Interpretation:  Sinus rhythm with 1st degree A-V block with occasional Premature ventricular complexes Left axis deviation Left ventricular hypertrophy with QRS widening and repolarization abnormality Cannot rule out Septal infarct , age undetermined Abnormal ECG Confirmed by Ripley Fraise [id number]) on 04/19/2017 1:51:18 AM       Radiology Ct Head Wo Contrast  Result Date: 04/18/2017 CLINICAL DATA:  Right leg weakness EXAM: CT HEAD WITHOUT CONTRAST TECHNIQUE: Contiguous axial images were obtained from the base of the skull through the vertex without intravenous contrast. COMPARISON:  04/14/2017 FINDINGS: Brain: Mild atrophic changes are noted. Chronic white matter ischemic changes are seen. A focal lacunar infarct is noted in the right cerebellar hemisphere stable from the previous exam. No acute hemorrhage or acute infarction is seen. Vascular: No hyperdense vessel or unexpected calcification. Skull: Normal. Negative for fracture or focal lesion. Sinuses/Orbits: No acute finding. Other: None. IMPRESSION: Chronic atrophic and ischemic changes without acute abnormality. Electronically Signed   By: Inez Catalina M.D.   On: 04/18/2017 17:34    Procedures Procedures   Medications Ordered in ED Medications - No data to display   Initial Impression / Assessment and Plan / ED Course  I have reviewed the triage vital signs and the nursing notes.  Pertinent labs & imaging results that were available during my care of the patient were reviewed by me and considered in my medical decision making (see chart for details).     2:37 AM  Patient with progressive weakness for over a week now.  She  denies any new back pain or incontinence to suggest cauda equina syndrome.  However she does  have bilateral lower extremity weakness, right >than left She also may have a right facial droop, possible dysarthria, but daughter says this not new This is a challenging exam, further complicated by the fact she has an ICD which would limit MRI Regardless, she would need to be admitted due to progressive weakness. I discussed with Dr. Lorraine Lax  with neurology to evaluate patient.  Will admit to hospital  tPA in stroke considered but not given due to: Onset over 3-4.5hours   Discussed with hospitalist for admission.  Final Clinical Impressions(s) / ED Diagnoses   Final diagnoses:  Weakness    ED Discharge Orders    None       Ripley Fraise, MD 04/19/17 [phone]

## 2017-04-19 NOTE — Telephone Encounter (Signed)
Reached out to the patient to inquire about getting her CPAP fixed and was informed by her daughter that she will call [facility] to make an appointment with the respiratory therapist to get it fixed.. Daughter will call when she has an appointment.

## 2017-04-19 NOTE — H&P (Signed)
History and Physical    Tara Dunn CWC:[id number] DOB: 12/13/1940 DOA: 04/19/2017  PCP: Fanny Bien, MD   Patient coming from: Home  Chief Complaint: Right leg weakness   HPI: Tara Dunn is a 77 y.o. female with medical history significant for chronic combined systolic/diastolic CHF, chronic kidney disease stage III, hypertension, and history of CVA, now presenting to the emergency department for evaluation of right lower extremity weakness.  Patient is accompanied by her daughter who assist with the history.  She had reportedly been in her usual state until approximately 5 days ago when she noted weakness involving the right leg.  Since that time, the symptoms have persisted and worsened.  She denies any lasting deficits from her prior strokes.  She denies headache, chest pain, or palpitations.  She reports that her chronic lower extremity swelling is unchanged.  Reports falling a few days ago secondary to leg weakness, but denies hitting her head or losing consciousness.  ED Course: Upon arrival to the ED, patient is found to be afebrile, saturating well on room air, and with vitals otherwise normal.  EKG features a sinus rhythm with first-degree AV nodal block, PVC, and LVH with repolarization abnormality.  Head CT is notable for chronic atrophic and ischemic changes, but no acute abnormality.  Chemistry panel reveals a creatinine of 1.69, consistent with her apparent baseline.  CBC is unremarkable except the platelets are clumped.  Troponin is undetectable.  Neurology was consulted by the ED physician.  Patient remains hemodynamically stable, in no apparent respiratory distress, and will be observed on the telemetry unit for ongoing evaluation and management of right-sided weakness suspected secondary to CVA.  Review of Systems:  All other systems reviewed and apart from HPI, are negative.  Past Medical History:  Diagnosis Date  . AICD (automatic cardioverter/defibrillator)  present 03/26/15   MDT ICD Dr. Lovena Le  . Aortic dilatation ([place])    a. 04/2014 CTA chest w/ incidental finding of distal thoracic Ao enlargement of 3.39 mm - f/u needed 04/2015.  . Arthritis    "all over my body"  . Cancer of left breast ([place]) 2009   s/p L mastectomy and chemo.  . Cardiomyopathy ([place])    a. 04/2014 Echo: EF 25-30%, mod conc LVH, possibl antsept HK, Gr1 DD, mod-sev dil LA.  . CHF (congestive heart failure) ([place])   . CKD (chronic kidney disease), stage III ([place])   . Depression   . GERD (gastroesophageal reflux disease)   . Gout   . Heart murmur   . History of stomach ulcers   . Hypertension    a. Dx @ age 38;  b. 04/2014 admission for HTN emergency.  . Obesity (BMI 30-39.9) 01/21/2015  . OSA on CPAP   . Stroke [facility])    a. 3 strokes - last in 1998; "memory problems since"  (03/26/2015)    Past Surgical History:  Procedure Laterality Date  . ABDOMINAL HYSTERECTOMY    . BREAST BIOPSY Left 2008  . CATARACT EXTRACTION, BILATERAL Bilateral   . DILATION AND CURETTAGE OF UTERUS    . EP IMPLANTABLE DEVICE N/A 03/26/2015   MDT single chamber ICD, Dr. Lovena Le  . LEFT HEART CATHETERIZATION WITH CORONARY ANGIOGRAM N/A 05/07/2014   Procedure: LEFT HEART CATHETERIZATION WITH CORONARY ANGIOGRAM;  Surgeon: Belva Crome, MD;  Location[facility] CATH LAB;  Service: Cardiovascular;  Laterality: N/A;  . MASTECTOMY Left 2009     reports that she has quit smoking. Her smoking use included cigarettes. She  has a 20.00 pack-year smoking history. she has never used smokeless tobacco. She reports that she does not drink alcohol or use drugs.  Allergies  Allergen Reactions  . Shellfish Allergy Hives    Family History  Problem Relation Age of Onset  . High blood pressure Mother        Died @ 29.  . High blood pressure Father   . Heart attack Father        Died in his early 19's.  . High blood pressure Sister   . Cancer Sister   . Cancer Daughter   . Heart disease Daughter      Prior to  Admission medications   Medication Sig Start Date End Date Taking? Authorizing Provider  allopurinol (ZYLOPRIM) 100 MG tablet Take 200 mg by mouth daily.  03/28/14  Yes [provider]  aspirin EC 81 MG tablet Take 1 tablet (81 mg total) by mouth daily. 05/20/14  Yes Larey Dresser, MD  atorvastatin (LIPITOR) 40 MG tablet Take 1 tablet (40 mg total) by mouth daily. 10/26/16  Yes Larey Dresser, MD  carvedilol (COREG) 25 MG tablet Take 1 tablet (25 mg total) by mouth 2 (two) times daily. 10/26/16  Yes Larey Dresser, MD  colchicine 0.6 MG tablet Take 0.6 mg by mouth daily.   Yes [provider]  furosemide (LASIX) 40 MG tablet Take 1 tablet (40 mg total) by mouth daily. 04/14/17  Yes Larey Dresser, MD  gabapentin (NEURONTIN) 300 MG capsule Take 300 mg by mouth 3 (three) times daily.   Yes [provider]  isosorbide-hydrALAZINE (BIDIL) 20-37.5 MG tablet Take 2 tablets by mouth 2 (two) times daily. 03/21/17  Yes Clegg, Amy D, NP  omeprazole (PRILOSEC) 40 MG capsule Take 40 mg by mouth daily. 03/28/14  Yes [provider]  sacubitril-valsartan (ENTRESTO) 97-103 MG Take 1 tablet by mouth 2 (two) times daily. 04/04/17  Yes Larey Dresser, MD  spironolactone (ALDACTONE) 25 MG tablet Take 1 tablet (25 mg total) by mouth daily. 02/10/17  Yes Larey Dresser, MD    Physical Exam: Vitals:   04/18/17 1703 04/19/17 0157 04/19/17 0245 04/19/17 0330  BP: 137/84 (!) 150/114 (!) 148/82 138/78  Pulse: 65 74 77 76  Resp: 16 18    Temp: 98.1 F (36.7 C) 98.8 F (37.1 C)    TempSrc: Oral     SpO2: 100% 96% 99% 99%      Constitutional: NAD, calm  Eyes: PERTLA, lids and conjunctivae normal ENMT: Mucous membranes are moist. Posterior pharynx clear of any exudate or lesions.   Neck: normal, supple, no masses, no thyromegaly Respiratory: clear to auscultation bilaterally, no wheezing, no crackles. Normal respiratory effort.   Cardiovascular: S1 & S2 heard, regular rate  and rhythm. 1+ pretibial edema bilaterally. No significant JVD. Abdomen: No distension, no tenderness, no masses palpated. Bowel sounds normal.  Musculoskeletal: no clubbing / cyanosis. No joint deformity upper and lower extremities.   Skin: no significant rashes, lesions, ulcers. Warm, dry, well-perfused. Neurologic: right lower facial weakness, CN II-XII otherwise intact. Sensation to light touch intact. Strength 3/5 throughout RLE and 4/5 throughout RUE. Strength 5/5 in LUE and LLE.  Psychiatric:  Alert and oriented x 3. Pleasant and coopertive.     Labs on Admission: I have personally reviewed following labs and imaging studies  CBC: Recent Labs  Lab 04/14/17 1550 04/18/17 1755 04/18/17 1800  WBC 3.9* 4.2  --   NEUTROABS 2.2 2.4  --  HGB 11.0* 12.0 12.2  HCT 34.1* 37.4 36.0  MCV 91.4 91.4  --   PLT 135* PLATELETS APPEAR ADEQUATE  --    Basic Metabolic Panel: Recent Labs  Lab 04/14/17 1550 04/18/17 1755 04/18/17 1800  NA 141 137 139  K 4.2 5.0 5.1  CL 109 105 104  CO2 22 21*  --   GLUCOSE 92 92 95  BUN 41* 36* 45*  CREATININE 1.64* 1.69* 1.70*  CALCIUM 9.4 10.1  --    GFR: Estimated Creatinine Clearance: 31.3 mL/min (A) (by C-G formula based on SCr of 1.7 mg/dL (H)). Liver Function Tests: Recent Labs  Lab 04/14/17 1550 04/18/17 1755  AST 56* 46*  ALT 42 30  ALKPHOS 78 76  BILITOT 0.5 1.1  PROT 5.7* 6.5  ALBUMIN 3.7 3.8   No results for input(s): LIPASE, AMYLASE in the last 168 hours. No results for input(s): AMMONIA in the last 168 hours. Coagulation Profile: Recent Labs  Lab 04/18/17 1755  INR 1.02   Cardiac Enzymes: No results for input(s): CKTOTAL, CKMB, CKMBINDEX, TROPONINI in the last 168 hours. BNP (last 3 results) No results for input(s): PROBNP in the last 8760 hours. HbA1C: No results for input(s): HGBA1C in the last 72 hours. CBG: Recent Labs  Lab 04/19/17 0222  GLUCAP 74   Lipid Profile: No results for input(s): CHOL, HDL,  LDLCALC, TRIG, CHOLHDL, LDLDIRECT in the last 72 hours. Thyroid Function Tests: No results for input(s): TSH, T4TOTAL, FREET4, T3FREE, THYROIDAB in the last 72 hours. Anemia Panel: No results for input(s): VITAMINB12, FOLATE, FERRITIN, TIBC, IRON, RETICCTPCT in the last 72 hours. Urine analysis:    Component Value Date/Time   COLORURINE YELLOW 03/16/2017 1937   APPEARANCEUR CLEAR 03/16/2017 1937   LABSPEC 1.011 03/16/2017 1937   PHURINE 5.0 03/16/2017 1937   GLUCOSEU NEGATIVE 03/16/2017 1937   HGBUR NEGATIVE 03/16/2017 1937   BILIRUBINUR NEGATIVE 03/16/2017 1937   KETONESUR NEGATIVE 03/16/2017 1937   PROTEINUR NEGATIVE 03/16/2017 1937   NITRITE NEGATIVE 03/16/2017 1937   LEUKOCYTESUR NEGATIVE 03/16/2017 1937   Sepsis Labs: @LABRCNTIP (procalcitonin:4,lacticidven:4) )No results found for this or any previous visit (from the past 240 hour(s)).   Radiological Exams on Admission: Ct Head Wo Contrast  Result Date: 04/18/2017 CLINICAL DATA:  Right leg weakness EXAM: CT HEAD WITHOUT CONTRAST TECHNIQUE: Contiguous axial images were obtained from the base of the skull through the vertex without intravenous contrast. COMPARISON:  04/14/2017 FINDINGS: Brain: Mild atrophic changes are noted. Chronic white matter ischemic changes are seen. A focal lacunar infarct is noted in the right cerebellar hemisphere stable from the previous exam. No acute hemorrhage or acute infarction is seen. Vascular: No hyperdense vessel or unexpected calcification. Skull: Normal. Negative for fracture or focal lesion. Sinuses/Orbits: No acute finding. Other: None. IMPRESSION: Chronic atrophic and ischemic changes without acute abnormality. Electronically Signed   By: Inez Catalina M.D.   On: 04/18/2017 17:34    EKG: Independently reviewed. Sinus rhythm, 1st degree AV block, PVC, LVH with repolarization abnormality.   Assessment/Plan   1. Right-sided weakness; hx of CVA  - Presents with 5 days of weakness involving  right leg primarily, but also noted to have right facial droop, mild dysarthria, and right arm weakness  - She denies any lasting deficits from her prior CVA's  - Head CT is negative for acute findings  - Neurology is consulting and much appreciated  - MRI brain and MRA head if ICD is compatible  - Check echocardiogram, carotid dopplers, A1c,  and fasting lipid panel  - Continue cardiac monitoring, neuro checks, ASA, and statin   2. Chronic combined systolic & diastolic CHF  - Appears well-compensated  - Continue Lasix, Aldactone, Coreg, Bidil, Entresto  - SLIV, follow daily wts    3. Hypertension  - BP at goal  - Continue Entresto, Bidil, Coreg    4. CKD stage III  - SCr is 1.69 on admission, consistent with her apparent baseline  - Renally-dose medications, avoid nephrotoxins     DVT prophylaxis: SCD's  Code Status: Full  Family Communication: Daughter updated at bedside  Consults called: Neurology  Admission status: Observation     Vianne Bulls, MD [facility] Pager [phone]  If 7PM-7AM, please contact night-coverage [email or web address] Password [facility]  04/19/2017, 4:09 AM

## 2017-04-19 NOTE — Progress Notes (Signed)
Patient arrived to 3W18 from ED at this time with daughter. Safety precautions and orders reviewed. TELE applied and confirmed. VSS. MD notified of arrival.. No other concerns or distress. Will continue to monitor.   Ave Filter, RN

## 2017-04-19 NOTE — Progress Notes (Signed)
Pt returned from MRI.  Ave Filter, RN

## 2017-04-20 ENCOUNTER — Observation Stay (HOSPITAL_BASED_OUTPATIENT_CLINIC_OR_DEPARTMENT_OTHER): Payer: PPO

## 2017-04-20 DIAGNOSIS — I5042 Chronic combined systolic (congestive) and diastolic (congestive) heart failure: Secondary | ICD-10-CM

## 2017-04-20 DIAGNOSIS — Z87891 Personal history of nicotine dependence: Secondary | ICD-10-CM | POA: Diagnosis not present

## 2017-04-20 DIAGNOSIS — Z9221 Personal history of antineoplastic chemotherapy: Secondary | ICD-10-CM | POA: Diagnosis not present

## 2017-04-20 DIAGNOSIS — R609 Edema, unspecified: Secondary | ICD-10-CM

## 2017-04-20 DIAGNOSIS — Z95810 Presence of automatic (implantable) cardiac defibrillator: Secondary | ICD-10-CM | POA: Diagnosis not present

## 2017-04-20 DIAGNOSIS — I63312 Cerebral infarction due to thrombosis of left middle cerebral artery: Secondary | ICD-10-CM | POA: Diagnosis not present

## 2017-04-20 DIAGNOSIS — I639 Cerebral infarction, unspecified: Secondary | ICD-10-CM | POA: Diagnosis not present

## 2017-04-20 DIAGNOSIS — N183 Chronic kidney disease, stage 3 unspecified: Secondary | ICD-10-CM

## 2017-04-20 DIAGNOSIS — D62 Acute posthemorrhagic anemia: Secondary | ICD-10-CM | POA: Diagnosis not present

## 2017-04-20 DIAGNOSIS — I6789 Other cerebrovascular disease: Secondary | ICD-10-CM | POA: Diagnosis present

## 2017-04-20 DIAGNOSIS — I69351 Hemiplegia and hemiparesis following cerebral infarction affecting right dominant side: Secondary | ICD-10-CM | POA: Diagnosis not present

## 2017-04-20 DIAGNOSIS — Z6838 Body mass index (BMI) 38.0-38.9, adult: Secondary | ICD-10-CM | POA: Diagnosis not present

## 2017-04-20 DIAGNOSIS — E669 Obesity, unspecified: Secondary | ICD-10-CM | POA: Diagnosis present

## 2017-04-20 DIAGNOSIS — N179 Acute kidney failure, unspecified: Secondary | ICD-10-CM | POA: Diagnosis not present

## 2017-04-20 DIAGNOSIS — R531 Weakness: Secondary | ICD-10-CM | POA: Diagnosis present

## 2017-04-20 DIAGNOSIS — R29810 Facial weakness: Secondary | ICD-10-CM | POA: Diagnosis present

## 2017-04-20 DIAGNOSIS — M48061 Spinal stenosis, lumbar region without neurogenic claudication: Secondary | ICD-10-CM | POA: Diagnosis present

## 2017-04-20 DIAGNOSIS — M109 Gout, unspecified: Secondary | ICD-10-CM | POA: Diagnosis present

## 2017-04-20 DIAGNOSIS — I10 Essential (primary) hypertension: Secondary | ICD-10-CM

## 2017-04-20 DIAGNOSIS — I6930 Unspecified sequelae of cerebral infarction: Secondary | ICD-10-CM | POA: Diagnosis not present

## 2017-04-20 DIAGNOSIS — Z853 Personal history of malignant neoplasm of breast: Secondary | ICD-10-CM | POA: Diagnosis not present

## 2017-04-20 DIAGNOSIS — R29898 Other symptoms and signs involving the musculoskeletal system: Secondary | ICD-10-CM

## 2017-04-20 DIAGNOSIS — K219 Gastro-esophageal reflux disease without esophagitis: Secondary | ICD-10-CM | POA: Diagnosis present

## 2017-04-20 DIAGNOSIS — I429 Cardiomyopathy, unspecified: Secondary | ICD-10-CM | POA: Diagnosis present

## 2017-04-20 DIAGNOSIS — R0989 Other specified symptoms and signs involving the circulatory and respiratory systems: Secondary | ICD-10-CM | POA: Diagnosis not present

## 2017-04-20 DIAGNOSIS — Z90710 Acquired absence of both cervix and uterus: Secondary | ICD-10-CM | POA: Diagnosis not present

## 2017-04-20 DIAGNOSIS — I6381 Other cerebral infarction due to occlusion or stenosis of small artery: Secondary | ICD-10-CM | POA: Diagnosis present

## 2017-04-20 DIAGNOSIS — Z8673 Personal history of transient ischemic attack (TIA), and cerebral infarction without residual deficits: Secondary | ICD-10-CM | POA: Diagnosis not present

## 2017-04-20 DIAGNOSIS — Z7982 Long term (current) use of aspirin: Secondary | ICD-10-CM | POA: Diagnosis not present

## 2017-04-20 DIAGNOSIS — G4733 Obstructive sleep apnea (adult) (pediatric): Secondary | ICD-10-CM | POA: Diagnosis present

## 2017-04-20 DIAGNOSIS — G8191 Hemiplegia, unspecified affecting right dominant side: Secondary | ICD-10-CM | POA: Diagnosis present

## 2017-04-20 DIAGNOSIS — E785 Hyperlipidemia, unspecified: Secondary | ICD-10-CM | POA: Diagnosis present

## 2017-04-20 DIAGNOSIS — Z9012 Acquired absence of left breast and nipple: Secondary | ICD-10-CM | POA: Diagnosis not present

## 2017-04-20 DIAGNOSIS — E46 Unspecified protein-calorie malnutrition: Secondary | ICD-10-CM | POA: Diagnosis not present

## 2017-04-20 DIAGNOSIS — I130 Hypertensive heart and chronic kidney disease with heart failure and stage 1 through stage 4 chronic kidney disease, or unspecified chronic kidney disease: Secondary | ICD-10-CM | POA: Diagnosis present

## 2017-04-20 LAB — BASIC METABOLIC PANEL
ANION GAP: 11 (ref 5–15)
BUN: 40 mg/dL — AB (ref 6–20)
CHLORIDE: 105 mmol/L (ref 101–111)
CO2: 21 mmol/L — ABNORMAL LOW (ref 22–32)
Calcium: 10 mg/dL (ref 8.9–10.3)
Creatinine, Ser: 1.97 mg/dL — ABNORMAL HIGH (ref 0.44–1.00)
GFR calc Af Amer: 27 mL/min — ABNORMAL LOW (ref >60)
GFR, EST NON AFRICAN AMERICAN: 23 mL/min — AB (ref >60)
Glucose, Bld: 103 mg/dL — ABNORMAL HIGH (ref 65–99)
POTASSIUM: 5.1 mmol/L (ref 3.5–5.1)
Sodium: 137 mmol/L (ref 135–145)

## 2017-04-20 LAB — ECHOCARDIOGRAM COMPLETE: Weight: 3495.61 oz

## 2017-04-20 MED ORDER — ASPIRIN EC 81 MG PO TBEC
81.0000 mg | DELAYED_RELEASE_TABLET | Freq: Every day | ORAL | Status: DC
  Administered 2017-04-21 – 2017-04-22 (×2): 81 mg via ORAL
  Filled 2017-04-20 (×2): qty 1

## 2017-04-20 MED ORDER — CLOPIDOGREL BISULFATE 75 MG PO TABS
75.0000 mg | ORAL_TABLET | Freq: Every day | ORAL | Status: DC
  Administered 2017-04-21 – 2017-04-22 (×3): 75 mg via ORAL
  Filled 2017-04-20 (×3): qty 1

## 2017-04-20 NOTE — Evaluation (Signed)
Speech Language Pathology Evaluation Patient Details Name: Tara Dunn MRN: [id number] DOB: [birth year] Today's Date: 04/20/2017 Time: 0932-6712 SLP Time Calculation (min) (ACUTE ONLY): 14 min  Problem List:  Patient Active Problem List   Diagnosis Date Noted  . Right leg weakness 04/19/2017  . Right sided weakness 04/19/2017  . Thalamic infarct, acute ([place]) 04/19/2017  . Joint pain 12/09/2015  . ICD (implantable cardioverter-defibrillator) in place 03/26/2015  . Syncope 03/06/2015  . Obesity (BMI 30-39.9) 01/21/2015  . Excessive daytime sleepiness 08/04/2014  . OSA (obstructive sleep apnea) 08/04/2014  . Essential hypertension 06/13/2014  . Chronic systolic CHF (congestive heart failure) ([place]) 05/20/2014  . Congestive heart disease ([place])   . Chronic combined systolic and diastolic CHF (congestive heart failure) ([place]) 05/03/2014  . CKD (chronic kidney disease), stage III ([place]) 05/03/2014  . Hx of stroke without residual deficits 05/03/2014   Past Medical History:  Past Medical History:  Diagnosis Date  . 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 Lovena Le  . Aortic dilatation ([place])    a. 04/2014 CTA chest w/ incidental finding of distal thoracic Ao enlargement of 3.39 mm - f/u needed 04/2015.  . Arthritis    "all over my body"  . Cancer of left breast ([place]) 2009   s/p L mastectomy and chemo.  . Cardiomyopathy ([place])    a. 04/2014 Echo: EF 25-30%, mod conc LVH, possibl antsept HK, Gr1 DD, mod-sev dil LA.  . CHF (congestive heart failure) ([place])   . CKD (chronic kidney disease), stage III ([place])   . Depression   . GERD (gastroesophageal reflux disease)   . Gout   . Heart murmur   . History of stomach ulcers   . Hypertension    a. Dx @ age 67;  b. 04/2014 admission for HTN emergency.  . Obesity (BMI 30-39.9) 01/21/2015  . OSA on CPAP   . Stroke [facility])    a. 3 strokes - last in 1998; "memory problems since"  (03/26/2015)   Past Surgical  History:  Past Surgical History:  Procedure Laterality Date  . ABDOMINAL HYSTERECTOMY    . BREAST BIOPSY Left 2008  . CATARACT EXTRACTION, BILATERAL Bilateral   . DILATION AND CURETTAGE OF UTERUS    . EP IMPLANTABLE DEVICE N/A 03/26/2015   MDT single chamber ICD, Dr. Lovena Le  . LEFT HEART CATHETERIZATION WITH CORONARY ANGIOGRAM N/A 05/07/2014   Procedure: LEFT HEART CATHETERIZATION WITH CORONARY ANGIOGRAM;  Surgeon: Belva Crome, MD;  Location: [facility] CATH LAB;  Service: Cardiovascular;  Laterality: N/A;  . MASTECTOMY Left 2009   HPI:  77 y.o.femalewith past medical history of breast cancer, cardiomyopathy status post ICD, systolic heart failure, CKD, prior CVA,hypertension presents with progressive right leg weakness since the last few months, worse over last several days. MRI shows acute to early subacute lacunar infarct in the posterior limb of left internal capsule.   Assessment / Plan / Recommendation Clinical Impression  Pt presents with a mild-mod dysarthria of speech, mild higher-level word-finding deficits at generative naming level.  Comprehension is WNL.  Basic cognition WNL.  REc SLP f/u for speech intelligibility; pt agrees.     SLP Assessment  SLP Recommendation/Assessment: Patient needs continued Speech Lanaguage Pathology Services SLP Visit Diagnosis: Dysarthria and anarthria (R47.1)    Follow Up Recommendations  Other (comment)(tba)    Frequency and Duration min 1 x/week  1 week      SLP Evaluation Cognition  Overall Cognitive Status: No family/caregiver present to determine baseline cognitive  functioning Orientation Level: Oriented to person;Oriented to place;Oriented to time Attention: Sustained Sustained Attention: Appears intact       Comprehension  Auditory Comprehension Overall Auditory Comprehension: Appears within functional limits for tasks assessed Yes/No Questions: Within Functional Limits Commands: Within Functional Limits Conversation:  Simple Visual Recognition/Discrimination Discrimination: Within Function Limits Reading Comprehension Reading Status: Within funtional limits    Expression Expression Primary Mode of Expression: Verbal Verbal Expression Overall Verbal Expression: Impaired Initiation: No impairment Level of Generative/Spontaneous Verbalization: Conversation Repetition: No impairment Naming: Impairment Responsive: (wnl) Confrontation: Within functional limits Divergent: 25-49% accurate Written Expression Dominant Hand: Right Written Expression: Not tested   Oral / Motor  Oral Motor/Sensory Function Overall Oral Motor/Sensory Function: Mild impairment Facial ROM: Reduced right Facial Symmetry: Abnormal symmetry right;Suspected CN VII (facial) dysfunction Lingual ROM: Within Functional Limits Lingual Symmetry: Within Functional Limits Motor Speech Overall Motor Speech: Impaired Respiration: Within functional limits Phonation: Normal Resonance: Within functional limits Articulation: Impaired Intelligibility: Intelligibility reduced Phrase: 75-100% accurate Sentence: 75-100% accurate Conversation: 75-100% accurate   GO                    Juan Quam Laurice 04/20/2017, 11:41 AM

## 2017-04-20 NOTE — Progress Notes (Addendum)
STROKE TEAM PROGRESS NOTE   SUBJECTIVE (INTERVAL HISTORY) Her daughter is at the bedside.  Patient is sitting up in the chair at the bedside and states she feels her new RLE weakness is a little better. She reports she lives alone with significant help from home health and her daughter. She is agreeable to go to IP rehab as recommended. She has no new complaints.   OBJECTIVE Temp:  [97.8 F (36.6 C)-99.4 F (37.4 C)] 98 F (36.7 C) (03/13 1212) Pulse Rate:  [72-84] 72 (03/13 1212) Cardiac Rhythm: Heart block;Bundle branch block (03/13 0700) Resp:  [16-18] 18 (03/13 1212) BP: (86-119)/(47-77) 117/61 (03/13 1212) SpO2:  [98 %-100 %] 99 % (03/13 1212) Weight:  [99.1 kg (218 lb 7.6 oz)] 99.1 kg (218 lb 7.6 oz) (03/13 0500)  CBC:  Recent Labs  Lab 04/14/17 1550 04/18/17 1755 04/18/17 1800  WBC 3.9* 4.2  --   NEUTROABS 2.2 2.4  --   HGB 11.0* 12.0 12.2  HCT 34.1* 37.4 36.0  MCV 91.4 91.4  --   PLT 135* PLATELETS APPEAR ADEQUATE  --     Basic Metabolic Panel:  Recent Labs  Lab 04/18/17 1755 04/18/17 1800 04/20/17 0807  NA 137 139 137  K 5.0 5.1 5.1  CL 105 104 105  CO2 21*  --  21*  GLUCOSE 92 95 103*  BUN 36* 45* 40*  CREATININE 1.69* 1.70* 1.97*  CALCIUM 10.1  --  10.0    Lipid Panel:     Component Value Date/Time   CHOL 102 04/19/2017 0424   TRIG 59 04/19/2017 0424   HDL 48 04/19/2017 0424   CHOLHDL 2.1 04/19/2017 0424   VLDL 12 04/19/2017 0424   LDLCALC 42 04/19/2017 0424   HgbA1c:  Lab Results  Component Value Date   HGBA1C 6.1 (H) 04/19/2017   Urine Drug Screen: No results found for: LABOPIA, COCAINSCRNUR, LABBENZ, AMPHETMU, THCU, LABBARB  Alcohol Level No results found for: ETH  IMAGING Ct Head Wo Contrast 04/18/2017 Chronic atrophic and ischemic changes without acute abnormality.   Mr Brain 70 Contrast Mr Tara Dunn Head Wo Contrast 04/19/2017  1. Acute to early subacute lacunar infarct in the posterior limb of left internal capsule. No associated  hemorrhage or mass effect. 2. Severe underlying chronic small vessel disease. Chronic lacunar infarcts in the bilateral deep gray matter nuclei, brainstem, cerebellum, and cerebral white matter. 3. 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 4. Intracranial atherosclerosis. Hemodynamically significant stenosis is suspected only in the distal left vertebral artery.   Mr Lumbar Spine Wo Contrast 04/19/2017 1. At L4-5 there is a broad-based disc bulge with an annular fissure. Moderate bilateral facet arthropathy with ligamentum flavum infolding. Bilateral lateral recess stenosis. 2. At L3-4 there is a mild broad-based disc bulge eccentric towards the right. Moderate bilateral facet arthropathy. Mild spinal stenosis.    PHYSICAL EXAM Patient awake, alert. Dysarthric. No aphasia. Able to follow commands. Extraocular movements intact. VFF. Right facial weakness. Right upper extremity with 4/5 strength. Weakness in BLE, L > R with RLE 1/5 strength and LLE 2/5 strength.  Moves left arm without difficulty, strength normal. Left arm orbits right. Plantars down on the right, up on  the left. Decreased sensation on the right side of the body. Heart rate is regular. Breath sounds are clear.    ASSESSMENT/PLAN Tara Dunn is a 77 y.o. female with history of breast cancer, cardiomyopathy status post ICD, systolic heart failure, CKD, pior CVA,  hypertension with progressive RLE weakness presenting with worsening of the RLE weakness. She did not receive IV t-PA.   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 Small vessel disease. Multiple chronic lacunes. Atrophy.   MRA head L ICA fusiform aneurysm. L VA w/ hemodynamic stenosis.  MR LSL4-5 broad-based disc bulge w/ annular fissure. L3-4 mild broad-based disc bulge to the R. Mod facet  arthropathy. Mild spinal stenosis  Carotid Doppler  B ICA 1-39% stenosis, VAs antegrade   2D Echo  EF 45-50%. No source of embolus (improved EF since 06/2016)  LDL 42  HgbA1c 6.1  SCDs for VTE prophylaxis  Fall precautions  Diet Heart Room service appropriate? Yes; Fluid consistency: Thin  aspirin 81 mg daily prior to admission, now on aspirin 325 mg daily  Patient counseled to be compliant with her antithrombotic medications  Ongoing aggressive stroke risk factor management  Therapy recommendations:  CIR  Disposition:  pending (was at home alone w/ max home health services)  At discharge, follow up with Dr. Posey Dunn and [facility] (as PTA)  Hypertension  BP variable - 86/49 - 143/71  Permissive hypertension (OK if < 220/120) but gradually normalize in 5-7 days  Long-term BP goal normotensive  Hyperlipidemia  Home meds:  liptitor 40  LDL 42, goal < 70  Now on lipitor 80  Continue statin at discharge  Other Stroke Risk Factors  Advanced age  Former Cigarette smoker  UDS / ETOH level not performed   Obesity, Body mass index is 38.7 kg/m., recommend weight loss, diet and exercise as appropriate   Hx stroke/TIA  Reported stroke x 3, last in 1998 with "memory problems since". Unable to confirm in [facility]  Obstructive sleep apnea, on CPAP at home  Chronic combined systolic and diastolic CHF, Cardiomyopathy w/ EF 25-30% in 2016. Has [employer] AICD, I have asked cardiolgy to interrogate if possible (nothing in hard chart)  Other Active Problems  Stage III CKD  GERD  Hx L breast CA s/p mastectomy and chemo 2009  Neuropathy - followed by Dr. Posey Dunn and [facility] day # [place] for Pager information 04/20/2017 2:45 PM   ATTENDING NOTE: I reviewed above note and agree with the assessment and plan. I have made any additions or clarifications directly to the above note. Pt was seen and  examined.   77 year old female with history of cardiomyopathy, CHF status post AICD, CKD, HTN, obesity, OSA on CPAP, left breast cancer status post left mastectomy and chemo and radiation in remission, and stroke in 1998 immediate for right leg weakness.  Patient has been having bilateral leg weakness for some time due to gout and ankle deformity, following with Dr. Posey Dunn at [facility] neurology.  This time he had significant weakness on the right lower extremity.  CT no acute abnormality, MRI showed left internal capsule acute infarct, also remote lacunar infarct in the bilateral BG, cerebellum and CR as well as brainstem.  MRA showed short segment fusiform aneurysm of cavernous left ICA 8 mm, likely due to atherosclerosis, do not feel needs intervention at this time, but warrant outpt follow up.  Carotid Doppler negative, EF 45 to 50% much improved from prior.  A1c 6.1 and LDL 42.   Patient stroke most likely small vessel disease, however need AICD interrogation to rule out  A. fib.  Patient follows with Dr. Lovena Le in the cardiology.  Patient on aspirin 81 and Lipitor at home.  Recommend dual antiplatelet for 3 weeks and then Plavix alone.  Continue Lipitor.  PT OT recommend CIR.   Rosalin Hawking, MD PhD Stroke Neurology 04/20/2017 6:38 PM     To contact Stroke Continuity provider, please refer to [email or web address] After hours, contact General Neurology

## 2017-04-20 NOTE — Progress Notes (Signed)
   04/20/17 0324 04/20/17 0325  Vitals  Temp 97.8 F (36.6 C) --   Temp Source Oral --   BP (!) 86/49 90/60  BP Location Right Arm Right Arm  BP Method Automatic Manual  Patient Position (if appropriate) Lying Lying  Pulse Rate 78 --   Pulse Rate Source Dinamap --   Resp 17 --   Oxygen Therapy  SpO2 98 % --   O2 Device Room Air --     MD notified of above vital signs. No new orders received. Pt asymptomatic at this time.

## 2017-04-20 NOTE — Consult Note (Signed)
Physical Medicine and Rehabilitation Consult Reason for Consult: Right sided weakness and dysarthria Referring Physician: [facility]   HPI: Tara Dunn is a 77 y.o. 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 Per chart review and patient, patient lives alone however she has been staying with her daughter for the past week she was needing more assistance with mobility using a rolling walker for ambulation. She states she needed  assistance wiht all ADLs PTA.  One level home. Presented 04/19/2017 with right-sided weakness as well as slurred speech. Creatinine 1.69, EKG sinus rhythm with first degree AV nodal block.. Troponin negative. CT reviewe,d unremarkable for acute process. MRI as well as MRA showed acute early subacute lacunar infarct posterior limb of left internal capsule. No associated hemorrhage or mass effect. 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 MRI lumbar spine showed L3-4 and L4-5 broad-based disc bulge with annular fissure. Mild spinal stenosis. Patient did not receive TPA. Echocardiogram with ejection fraction of 81% grade 1 diastolic dysfunction. Neurology consulted presently on aspirin 325 mg daily for CVA prophylaxis. Tolerating a regular diet. Physical occupational therapy evaluations completed 04/20/2017 with recommendations of physical medicine rehabilitation consult.   Review of Systems  Constitutional: Negative for chills and fever.  HENT: Negative for hearing loss.   Eyes: Negative for blurred vision and double vision.  Respiratory: Negative for cough and shortness of breath.   Cardiovascular: Positive for leg swelling. Negative for chest pain and palpitations.  Gastrointestinal: Positive for constipation. Negative for  nausea and vomiting.       GERD  Genitourinary: Negative for dysuria, flank pain and hematuria.  Musculoskeletal: Positive for joint pain and myalgias.  Skin: Negative for rash.  Neurological: Positive for speech change and focal weakness.  Psychiatric/Behavioral: Positive for depression.  All other systems reviewed and are negative.  Past Medical History:  Diagnosis Date  . 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 Lovena Le  . Aortic dilatation ([place])    a. 04/2014 CTA chest w/ incidental finding of distal thoracic Ao enlargement of 3.39 mm - f/u needed 04/2015.  . Arthritis    "all over my body"  . Cancer of left breast ([place]) 2009   s/p L mastectomy and chemo.  . Cardiomyopathy ([place])    a. 04/2014 Echo: EF 25-30%, mod conc LVH, possibl antsept HK, Gr1 DD, mod-sev dil LA.  . CHF (congestive heart failure) ([place])   . CKD (chronic kidney disease), stage III ([place])   . Depression   . GERD (gastroesophageal reflux disease)   . Gout   . Heart murmur   . History of stomach ulcers   . Hypertension    a. Dx @ age 72;  b. 04/2014 admission for HTN emergency.  . Obesity (BMI 30-39.9) 01/21/2015  . OSA on CPAP   . Stroke [facility])    a. 3 strokes - last in 1998; "memory problems since"  (03/26/2015)   Past Surgical History:  Procedure Laterality Date  . ABDOMINAL HYSTERECTOMY    . BREAST BIOPSY Left 2008  . CATARACT EXTRACTION, BILATERAL Bilateral   . DILATION AND CURETTAGE OF UTERUS    . EP IMPLANTABLE DEVICE N/A 03/26/2015   MDT single chamber ICD, Dr. Lovena Le  . LEFT HEART CATHETERIZATION WITH CORONARY ANGIOGRAM N/A 05/07/2014  Procedure: LEFT HEART CATHETERIZATION WITH CORONARY ANGIOGRAM;  Surgeon: Belva Crome, MD;  Location[facility] CATH LAB;  Service: Cardiovascular;  Laterality: N/A;  . MASTECTOMY Left 2009   Family History  Problem Relation Age of Onset  . High blood pressure Mother        Died @ 101.  . High blood pressure Father   . Heart attack Father         Died in his early 25's.  . High blood pressure Sister   . Cancer Sister   . Cancer Daughter   . Heart disease Daughter    Social History:  reports that she has quit smoking. Her smoking use included cigarettes. She has a 20.00 pack-year smoking history. she has never used smokeless tobacco. She reports that she does not drink alcohol or use drugs. Allergies:  Allergies  Allergen Reactions  . Shellfish Allergy Hives   Medications Prior to Admission  Medication Sig Dispense Refill  . allopurinol (ZYLOPRIM) 100 MG tablet Take 200 mg by mouth daily.   0  . aspirin EC 81 MG tablet Take 1 tablet (81 mg total) by mouth daily. 90 tablet 3  . atorvastatin (LIPITOR) 40 MG tablet Take 1 tablet (40 mg total) by mouth daily. 30 tablet 0  . carvedilol (COREG) 25 MG tablet Take 1 tablet (25 mg total) by mouth 2 (two) times daily. 60 tablet 0  . colchicine 0.6 MG tablet Take 0.6 mg by mouth daily.    . furosemide (LASIX) 40 MG tablet Take 1 tablet (40 mg total) by mouth daily. 90 tablet 3  . gabapentin (NEURONTIN) 300 MG capsule Take 300 mg by mouth 3 (three) times daily.    . isosorbide-hydrALAZINE (BIDIL) 20-37.5 MG tablet Take 2 tablets by mouth 2 (two) times daily. 120 tablet 11  . omeprazole (PRILOSEC) 40 MG capsule Take 40 mg by mouth daily.  0  . sacubitril-valsartan (ENTRESTO) 97-103 MG Take 1 tablet by mouth 2 (two) times daily. 60 tablet 3  . spironolactone (ALDACTONE) 25 MG tablet Take 1 tablet (25 mg total) by mouth daily. 30 tablet 3    Home: Home Living Family/patient expects to be discharged to:: Private residence Living Arrangements: Alone, Children Available Help at Discharge: Family, Available 24 hours/day Type of Home: Apartment Home Access: Level entry Home Layout: One level Bathroom Shower/Tub: Multimedia programmer: Standard Home Equipment: Environmental consultant - 2 wheels, Cane - single point, Bedside commode  Functional History: Prior Function Level of Independence:  Needs assistance Gait / Transfers Assistance Needed: Uses RW for ambulation; has needed more help recently due to weakness. ADL's / Homemaking Assistance Needed: Assist with bathing from daughter. Comments: Has been staying with daughter for the last week. Functional Status:  Mobility: Bed Mobility Overal bed mobility: Needs Assistance Bed Mobility: Rolling, Sidelying to Sit Rolling: Min guard Sidelying to sit: Min assist, HOB elevated General bed mobility comments: Increased time to get to EOB with use of rail and assist to elevate trunk as pt not able to push through RUE.  Transfers Overall transfer level: Needs assistance Equipment used: Rolling walker (2 wheeled) Transfers: Sit to/from Stand, [employer] Transfers Sit to Stand: Mod assist, +2 safety/equipment Stand pivot transfers: Mod assist, +2 physical assistance General transfer comment: Assist to power to standing wtih cues for hand placement, despite pulling up on RW. Stood from EOB x1, from [facility] x1, SPT bed to/from [facility] and bed to chair with mod A for balance, RW sequencing. Difficulty advancing LLE  and WB through RLE. Increased knee flexion bilaterally.      ADL: ADL Overall ADL's : Needs assistance/impaired Eating/Feeding: Set up, Sitting Grooming: Set up, Sitting Upper Body Bathing: Set up, Sitting, Minimal assistance Lower Body Bathing: Maximal assistance, Sit to/from stand, +2 for safety/equipment Upper Body Dressing : Minimal assistance, Set up, Sitting Lower Body Dressing: Maximal assistance, +2 for safety/equipment, Sit to/from stand Toilet Transfer: Moderate assistance, +2 for physical assistance, Stand-pivot, BSC, RW Toileting- Clothing Manipulation and Hygiene: Moderate assistance, +2 for safety/equipment, Sit to/from stand Tub/ Shower Transfer: Moderate assistance, Stand-pivot, +2 for physical assistance General ADL Comments: Pt completed bed mobility, SPT EOB>BSC>EOB>recliner.   Cognition: Cognition Overall  Cognitive Status: No family/caregiver present to determine baseline cognitive functioning Orientation Level: Oriented to person, Oriented to place, Oriented to time Attention: Sustained Sustained Attention: Appears intact Cognition Arousal/Alertness: Awake/alert Behavior During Therapy: WFL for tasks assessed/performed Overall Cognitive Status: No family/caregiver present to determine baseline cognitive functioning General Comments: A&Ox4. Not formally assessed.  Blood pressure 117/61, pulse 72, temperature 98 F (36.7 C), temperature source Oral, resp. rate 18, weight 99.1 kg (218 lb 7.6 oz), SpO2 99 %. Physical Exam  Vitals reviewed. Constitutional: She is oriented to person, place, and time. She appears well-developed.  77 year old obese female  HENT:  Head: Normocephalic and atraumatic.  Eyes: EOM are normal. Right eye exhibits no discharge. Left eye exhibits no discharge.  Neck: Normal range of motion. Neck supple. No thyromegaly present.  Cardiovascular: Normal rate, regular rhythm and normal heart sounds.  Respiratory: Effort normal and breath sounds normal. No respiratory distress.  GI: Soft. Bowel sounds are normal. She exhibits no distension.  Musculoskeletal:  LE edema  Neurological: She is alert and oriented to person, place, and time.  Speech is dysarthric but intelligible.  Follow simple commands. Right facial droop Motor: B/l UE: 4-/5 proximal to distal RLE: HF, KE 2+/5, ADF/PF 4+/5 LLE: HF, KE 3-/5, AF 3+/5 Dysarthria  Skin: Skin is warm and dry.  Psychiatric: She has a normal mood and affect. Her behavior is normal.    Results for orders placed or performed during the hospital encounter of 04/19/17 (from the past 24 hour(s))  Basic metabolic panel     Status: Abnormal   Collection Time: 04/20/17  8:07 AM  Result Value Ref Range   Sodium 137 135 - 145 mmol/L   Potassium 5.1 3.5 - 5.1 mmol/L   Chloride 105 101 - 111 mmol/L   CO2 21 (L) 22 - 32 mmol/L    Glucose, Bld 103 (H) 65 - 99 mg/dL   BUN 40 (H) 6 - 20 mg/dL   Creatinine, Ser 1.97 (H) 0.44 - 1.00 mg/dL   Calcium 10.0 8.9 - 10.3 mg/dL   GFR calc non Af Amer 23 (L) >60 mL/min   GFR calc Af Amer 27 (L) >60 mL/min   Anion gap 11 5 - 15   Ct Head Wo Contrast  Result Date: 04/18/2017 CLINICAL DATA:  Right leg weakness EXAM: CT HEAD WITHOUT CONTRAST TECHNIQUE: Contiguous axial images were obtained from the base of the skull through the vertex without intravenous contrast. COMPARISON:  04/14/2017 FINDINGS: Brain: Mild atrophic changes are noted. Chronic white matter ischemic changes are seen. A focal lacunar infarct is noted in the right cerebellar hemisphere stable from the previous exam. No acute hemorrhage or acute infarction is seen. Vascular: No hyperdense vessel or unexpected calcification. Skull: Normal. Negative for fracture or focal lesion. Sinuses/Orbits: No acute finding. Other: None. IMPRESSION: Chronic atrophic  and ischemic changes without acute abnormality. Electronically Signed   By: Inez Catalina M.D.   On: 04/18/2017 17:34   Mr Jodene Nam Head Wo Contrast  Result Date: 04/19/2017 CLINICAL DATA:  77 year old female with progressive right lower extremity weakness. Query recent stroke. EXAM: MRI HEAD WITHOUT CONTRAST MRA HEAD WITHOUT CONTRAST TECHNIQUE: Multiplanar, multiecho pulse sequences of the brain and surrounding structures were obtained without intravenous contrast. Angiographic images of the head were obtained using MRA technique without contrast. COMPARISON:  Head CT without contrast 04/18/2017. Lumbar spine MRI today reported separately. FINDINGS: MRI HEAD FINDINGS Brain: There is an oval 12 millimeter area of restricted diffusion involving the posterior lamina of the left internal capsule on series 3, image 25. there is associated T2 and FLAIR hyperintensity with mild T1 hypointensity. No associated hemorrhage or mass effect. No other diffusion restriction. Numerous chronic small or  lacunar infarcts are scattered in both cerebellar hemispheres, the pons, the bilateral deep gray matter nuclei, and the cerebral white matter including the body of the corpus callosum. No cortical encephalomalacia is identified. There are occasional chronic micro hemorrhages, including in the right thalamus and cerebellum. No midline shift, mass effect, evidence of mass lesion, ventriculomegaly, extra-axial collection or acute intracranial hemorrhage. Cervicomedullary junction and pituitary are within normal limits. Vascular: Major intracranial vascular flow voids are preserved. See intracranial MRA findings below. Skull and upper cervical spine: Negative visible cervical spine. Normal bone marrow signal. Hyperostosis of the calvarium, normal variant. Sinuses/Orbits: Postoperative changes to the globes, otherwise normal orbits soft tissues. Paranasal sinuses and mastoids are stable and well pneumatized. Other: Visible internal auditory structures appear normal. Scalp and face soft tissues appear negative. MRA HEAD FINDINGS Tortuosity of the distal vertebral arteries likely in part responsible for apparent decreased flow signal in the proximal V4 segments. However, the left V4 segment may be stenotic. The right V4 is dominant with no stenosis suspected. Patent basilar artery with mild irregularity and stenosis. Dominant AICAs suspected. Normal SCA origins. Fetal type PCA origins. Mildly irregular bilateral PCA branches. On the source images distal PCA flow signal appears symmetric. Antegrade flow signal in both ICA siphons. No siphon stenosis identified, but there is short segment of fusiform aneurysmal enlargement of the left cavernous ICA up to 8 mm diameter (series 4, image 57). The bilateral ophthalmic and posterior communicating artery origins are normal. Patent carotid termini. Dominant left ACA A1 segment. The right is mildly irregular and stenotic. Anterior communicating artery and ACA A2 segments appear  normal. MCA M1 segments and MCA bifurcations are patent with mild irregularity but no significant stenosis. Visible MCA branches are patent with mild irregularity. IMPRESSION: 1. Acute to early subacute lacunar infarct in the posterior limb of left internal capsule. No associated hemorrhage or mass effect. 2. Severe underlying chronic small vessel disease. Chronic lacunar infarcts in the bilateral deep gray matter nuclei, brainstem, cerebellum, and cerebral white matter. 3. 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 4. Intracranial atherosclerosis. Hemodynamically significant stenosis is suspected only in the distal left vertebral artery. Electronically Signed   By: Genevie Ann M.D.   On: 04/19/2017 15:25   Mr Brain Wo Contrast  Result Date: 04/19/2017 CLINICAL DATA:  77 year old female with progressive right lower extremity weakness. Query recent stroke. EXAM: MRI HEAD WITHOUT CONTRAST MRA HEAD WITHOUT CONTRAST TECHNIQUE: Multiplanar, multiecho pulse sequences of the brain and surrounding structures were obtained without intravenous contrast. Angiographic images of the head were obtained using MRA technique without contrast. COMPARISON:  Head CT without contrast 04/18/2017. Lumbar spine MRI today reported separately. FINDINGS: MRI HEAD FINDINGS Brain: There is an oval 12 millimeter area of restricted diffusion involving the posterior lamina of the left internal capsule on series 3, image 25. there is associated T2 and FLAIR hyperintensity with mild T1 hypointensity. No associated hemorrhage or mass effect. No other diffusion restriction. Numerous chronic small or lacunar infarcts are scattered in both cerebellar hemispheres, the pons, the bilateral deep gray matter nuclei, and the cerebral white matter including the body of the corpus callosum. No cortical encephalomalacia is identified. There are occasional chronic micro hemorrhages, including in the right thalamus and cerebellum. No  midline shift, mass effect, evidence of mass lesion, ventriculomegaly, extra-axial collection or acute intracranial hemorrhage. Cervicomedullary junction and pituitary are within normal limits. Vascular: Major intracranial vascular flow voids are preserved. See intracranial MRA findings below. Skull and upper cervical spine: Negative visible cervical spine. Normal bone marrow signal. Hyperostosis of the calvarium, normal variant. Sinuses/Orbits: Postoperative changes to the globes, otherwise normal orbits soft tissues. Paranasal sinuses and mastoids are stable and well pneumatized. Other: Visible internal auditory structures appear normal. Scalp and face soft tissues appear negative. MRA HEAD FINDINGS Tortuosity of the distal vertebral arteries likely in part responsible for apparent decreased flow signal in the proximal V4 segments. However, the left V4 segment may be stenotic. The right V4 is dominant with no stenosis suspected. Patent basilar artery with mild irregularity and stenosis. Dominant AICAs suspected. Normal SCA origins. Fetal type PCA origins. Mildly irregular bilateral PCA branches. On the source images distal PCA flow signal appears symmetric. Antegrade flow signal in both ICA siphons. No siphon stenosis identified, but there is short segment of fusiform aneurysmal enlargement of the left cavernous ICA up to 8 mm diameter (series 4, image 57). The bilateral ophthalmic and posterior communicating artery origins are normal. Patent carotid termini. Dominant left ACA A1 segment. The right is mildly irregular and stenotic. Anterior communicating artery and ACA A2 segments appear normal. MCA M1 segments and MCA bifurcations are patent with mild irregularity but no significant stenosis. Visible MCA branches are patent with mild irregularity. IMPRESSION: 1. Acute to early subacute lacunar infarct in the posterior limb of left internal capsule. No associated hemorrhage or mass effect. 2. Severe underlying  chronic small vessel disease. Chronic lacunar infarcts in the bilateral deep gray matter nuclei, brainstem, cerebellum, and cerebral white matter. 3. 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 4. Intracranial atherosclerosis. Hemodynamically significant stenosis is suspected only in the distal left vertebral artery. Electronically Signed   By: Genevie Ann M.D.   On: 04/19/2017 15:25   Mr Lumbar Spine Wo Contrast  Result Date: 04/19/2017 CLINICAL DATA:  approximately 1 month ago daughter noticed her mother requires more assistance moving her right leg while getting into the car. Last Thursday, daughter reports even worsening right leg weakness with patient being unable to get up from her bed without any support. EXAM: MRI LUMBAR SPINE WITHOUT CONTRAST TECHNIQUE: Multiplanar, multisequence MR imaging of the lumbar spine was performed. No intravenous contrast was administered. COMPARISON:  None. FINDINGS: Segmentation:  Standard. Alignment:  Physiologic. Vertebrae:  No fracture, evidence of discitis, or bone lesion. Conus medullaris and cauda equina: Conus extends to the L1 level. Conus and cauda equina appear normal. Paraspinal and other soft tissues: No acute paraspinal abnormality. Right renal cyst. Disc levels: Disc spaces: Disc desiccation at L3-4, L4-5 and L5-S1. T12-L1: No significant disc bulge. No evidence of  neural foraminal stenosis. No central canal stenosis. L1-L2: No significant disc bulge. No evidence of neural foraminal stenosis. No central canal stenosis. L2-L3: No significant disc bulge. No evidence of neural foraminal stenosis. No central canal stenosis. L3-L4: Mild broad-based disc bulge eccentric towards the right. Moderate bilateral facet arthropathy. Mild spinal stenosis. No evidence of neural foraminal stenosis. L4-L5: Broad-based disc bulge with an annular fissure. Moderate bilateral facet arthropathy with ligamentum flavum infolding. Bilateral lateral recess  stenosis. No evidence of neural foraminal stenosis. No central canal stenosis. L5-S1: Broad-based disc bulge. No evidence of neural foraminal stenosis. No central canal stenosis. IMPRESSION: 1. At L4-5 there is a broad-based disc bulge with an annular fissure. Moderate bilateral facet arthropathy with ligamentum flavum infolding. Bilateral lateral recess stenosis. 2. At L3-4 there is a mild broad-based disc bulge eccentric towards the right. Moderate bilateral facet arthropathy. Mild spinal stenosis. Electronically Signed   By: Kathreen Devoid   On: 04/19/2017 15:45   [place] Ekg Site Rite  Result Date: 04/19/2017 If Site Rite image not attached, placement could not be confirmed due to current cardiac rhythm.   Assessment/Plan: Diagnosis: lacunar infarct posterior limb of left internal  Labs and images independently reviewed.  Records reviewed and summated above Stroke: Continue secondary stroke prophylaxis and Risk Factor Modification listed below:   Antiplatelet therapy:   Blood Pressure Management:  Continue current medication with prn's with permisive HTN per primary team Statin Agent:   Prediabetes management:   Motor recovery: Fluoxetine  1. Does the need for close, 24 hr/day medical supervision in concert with the patient's rehab needs make it unreasonable for this patient to be served in a less intensive setting? Potentially  2. 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 Due to safety, disease management  and patient education, does the patient require 24 hr/day rehab nursing? Yes 4. Does the patient require coordinated care of a physician, rehab nurse, PT (1-2 hrs/day, 5 days/week), OT (1-2 hrs/day, 5 days/week) and SLP (1-2 hrs/day, 5 days/week) to address physical and functional deficits in the context of the above medical diagnosis(es)? Yes Addressing deficits in the following areas: balance, endurance, locomotion, strength, transferring, bathing, dressing, toileting, cognition, speech and psychosocial support 5. Can the patient actively participate in an intensive therapy program of at least 3 hrs of therapy per day at least 5 days per week? Yes 6. The potential for patient to make measurable gains while on inpatient rehab is excellent 7. Anticipated functional outcomes upon discharge from inpatient rehab are supervision and min assist  with PT, supervision and min assist with OT, modified independent with SLP. 8. Estimated rehab length of stay to reach the above functional goals is: 14-17 days. 9. Anticipated D/C setting: Home 10. Anticipated post D/C treatments: HH therapy and Home excercise program 11. Overall Rehab/Functional Prognosis: good and fair  RECOMMENDATIONS: This patient's condition is appropriate for continued rehabilitative care in the following setting: Need to clarify baseline level of functioning as pt states she needed assistance with all ADLs. If functional deficits present beyond baseline, recommend CIR. Patient has agreed to participate in recommended program. Potentially Note that  insurance prior authorization may be required for reimbursement for recommended care.  Comment: Rehab Admissions Coordinator to follow up.  Delice Lesch, MD, ABPMR Lavon Paganini Angiulli, PA-C 04/20/2017

## 2017-04-20 NOTE — Evaluation (Signed)
Physical Therapy Evaluation Patient Details Name: Tara Dunn MRN: [id number] DOB: Oct 30, 1940 Today's Date: 04/20/2017   History of Present Illness  Patient is a 77 y/o female who presents with RLE weakness, facial droop, dysarthria and fall. Head CT-unremarkable. BRain MRI-acute to subacute lacunar infarct in posterior limb of left internal capsule. Lumbar MRI- L3-4, 4-5 disc bulge. PMH includes stroke, HTN, depression, OSA, CKD CHF, AICD.   Clinical Impression  Patient presents with right sided weakness, decreased sensation, expressive language deficits?, impaired balance  and impaired mobility s/p above. Tolerated standing and SPT with Mod A for balance/safety secondary to BLE weakness, RLE>LLE and balance. Pt has been living with daughter for the last week due to weakness and has needed some assist with walking but prior to this pt living alone and ambulatory with RW. Would benefit from CIR to maximize independence and mobility prior to return home. Will follow acutely.    Follow Up Recommendations CIR;Supervision for mobility/OOB;Supervision/Assistance - 24 hour    Equipment Recommendations  None recommended by PT    Recommendations for Other Services Rehab consult     Precautions / Restrictions Precautions Precautions: Fall Restrictions Weight Bearing Restrictions: No      Mobility  Bed Mobility Overal bed mobility: Needs Assistance Bed Mobility: Rolling;Sidelying to Sit Rolling: Min guard Sidelying to sit: Min assist;HOB elevated       General bed mobility comments: Increased time to get to EOB with use of rail and assist to elevate trunk as pt not able to push through RUE.   Transfers Overall transfer level: Needs assistance Equipment used: Rolling walker (2 wheeled) Transfers: Sit to/from [facility] Sit to Stand: Mod assist;+2 safety/equipment Stand pivot transfers: Mod assist;+2 physical assistance       General transfer comment: Assist  to power to standing wtih cues for hand placement, despite pulling up on RW. Stood from EOB x1, from [facility] x1, SPT bed to/from [facility] and bed to chair with mod A for balance, RW sequencing. Difficulty advancing LLE and WB through RLE. Increased knee flexion bilaterally.  Ambulation/Gait                Stairs            Wheelchair Mobility    Modified Rankin (Stroke Patients Only) Modified Rankin (Stroke Patients Only) Pre-Morbid Rankin Score: Moderate disability Modified Rankin: Moderately severe disability     Balance Overall balance assessment: Needs assistance;History of Falls Sitting-balance support: Feet supported;No upper extremity supported Sitting balance-Leahy Scale: Fair     Standing balance support: During functional activity;Single extremity supported Standing balance-Leahy Scale: Poor Standing balance comment: Requires Mod A for dynamic standing balance for pericare due to posterior lean and bil knee weakness.                              Pertinent Vitals/Pain Pain Assessment: No/denies pain    Home Living Family/patient expects to be discharged to:: Private residence Living Arrangements: Alone;Children Available Help at Discharge: Family;Available 24 hours/day Type of Home: Apartment Home Access: Level entry     Home Layout: One level Home Equipment: Walker - 2 wheels;Cane - single point;Bedside commode      Prior Function Level of Independence: Needs assistance   Gait / Transfers Assistance Needed: Uses RW for ambulation; has needed more help recently due to weakness.  ADL's / Homemaking Assistance Needed: Assist with bathing from daughter.  Comments: Has been staying with daughter for  the last week.     Hand Dominance   Dominant Hand: Right    Extremity/Trunk Assessment   Upper Extremity Assessment Upper Extremity Assessment: Defer to OT evaluation    Lower Extremity Assessment Lower Extremity Assessment: RLE  deficits/detail;LLE deficits/detail RLE Deficits / Details: Grossly ~3/5 knee extension, 2+/5 knee flexion, 1/5 hip flexion. RLE Sensation: decreased light touch RLE Coordination: decreased fine motor;decreased gross motor LLE Deficits / Details: Grossly ~3/5 throughout. LLE Sensation: decreased light touch       Communication   Communication: Expressive difficulties  Cognition Arousal/Alertness: Awake/alert Behavior During Therapy: WFL for tasks assessed/performed Overall Cognitive Status: No family/caregiver present to determine baseline cognitive functioning                                 General Comments: A&Ox4. Not formally assessed.      General Comments General comments (skin integrity, edema, etc.): Daughter stepped out for session.    Exercises     Assessment/Plan    PT Assessment Patient needs continued PT services  PT Problem List Decreased strength;Decreased mobility;Decreased range of motion;Decreased activity tolerance;Impaired sensation;Decreased balance       PT Treatment Interventions Functional mobility training;Balance training;Patient/family education;Gait training;Therapeutic activities;Neuromuscular re-education;Therapeutic exercise;DME instruction    PT Goals (Current goals can be found in the Care Plan section)  Acute Rehab PT Goals Patient Stated Goal: to return to [facility] and be able to walk PT Goal Formulation: With patient Time For Goal Achievement: 05/04/17 Potential to Achieve Goals: Good    Frequency Min 4X/week   Barriers to discharge        Co-evaluation PT/OT/SLP Co-Evaluation/Treatment: Yes Reason for Co-Treatment: For patient/therapist safety;To address functional/ADL transfers           AM-PAC PT "6 Clicks" Daily Activity  Outcome Measure Difficulty turning over in bed (including adjusting bedclothes, sheets and blankets)?: None Difficulty moving from lying on back to sitting on the side of the bed? :  Unable Difficulty sitting down on and standing up from a chair with arms (e.g., wheelchair, bedside commode, etc,.)?: Unable Help needed moving to and from a bed to chair (including a wheelchair)?: A Lot Help needed walking in hospital room?: A Lot Help needed climbing 3-5 steps with a railing? : Total 6 Click Score: 11    End of Session Equipment Utilized During Treatment: Gait belt Activity Tolerance: Patient tolerated treatment well Patient left: in chair;with call bell/phone within reach;with chair alarm set Nurse Communication: Mobility status PT Visit Diagnosis: Muscle weakness (generalized) (M62.81);Hemiplegia and hemiparesis;Difficulty in walking, not elsewhere classified (R26.2);Unsteadiness on feet (R26.81) Hemiplegia - Right/Left: Right Hemiplegia - dominant/non-dominant: Dominant Hemiplegia - caused by: Cerebral infarction    Time: 6389-3734 PT Time Calculation (min) (ACUTE ONLY): 24 min   Charges:   PT Evaluation $PT Eval Moderate Complexity: 1 Mod     PT G CodesWray Kearns, PT, DPT [phone]    Tara Dunn 04/20/2017, 12:04 PM

## 2017-04-20 NOTE — Progress Notes (Signed)
Patient ID: Tara Dunn, female   DOB: 03-22-1940, 77 y.o.   MRN: [id number]  PROGRESS NOTE    Tara Dunn  TOI:[id number] DOB: 04/25/1940 DOA: 04/19/2017 PCP: Fanny Bien, MD   Brief Narrative:  77 year old female with history of chronic combined systolic/diastolic heart failure, chronic kidney disease stage III, hypertension and history of CVA presented on 04/19/2017 with worsening right leg weakness.  Initial CT scan of the head was negative for acute abnormality.  Neurology was consulted.   Assessment & Plan:   Principal Problem:   Right leg weakness Active Problems:   Chronic combined systolic and diastolic CHF (congestive heart failure) (HCC)   CKD (chronic kidney disease), stage III (HCC)   Hx of stroke without residual deficits   Essential hypertension   OSA (obstructive sleep apnea)   ICD (implantable cardioverter-defibrillator) in place   Right sided weakness   Thalamic infarct, acute ([place])     1.  Acute to subacute CVA presenting with right-sided weakness in a patient with history of CVA  - MRI confirmed that she had acute to subacute CVA.  Neurology following. -Continue aspirin and statin. -Follow-up 2D echo. -PT/OT eval.  Diet as per SLP recommendations   2. Chronic combined systolic & diastolic CHF  - Appears well-compensated  - Continue Lasix, Aldactone, Coreg, Bidil, Entresto  if blood pressure allows -Strict input and output.  Daily weights.  Outpatient follow-up with cardiology   3. Hypertension  - BP on the lower side.  Monitor.  Use antihypertensives with holding parameters, hold dose if blood pressure less than 100   4. CKD stage III  - Monitor.  Repeat a.m. creatinine   DVT prophylaxis: SCD's  Code Status: Full  Family Communication: Daughter updated at bedside  Disposition Plan: Home once cleared by neurology  Consultants: Neurology  Procedures: Echo pending  Antimicrobials: None   Subjective: Patient seen and  examined at bedside.  She denies any overnight fever, nausea or vomiting.  Still complains of right lower extremity weakness.  Objective: Vitals:   04/20/17 0325 04/20/17 0500 04/20/17 0520 04/20/17 0846  BP: 90/60  (!) 96/47 (!) 91/51  Pulse:   72 83  Resp:   17 18  Temp:   97.9 F (36.6 C) 97.9 F (36.6 C)  TempSrc:   Oral   SpO2:   99% 99%  Weight:  99.1 kg (218 lb 7.6 oz)      Intake/Output Summary (Last 24 hours) at 04/20/2017 1017 Last data filed at 04/19/2017 2137 Gross per 24 hour  Intake 600 ml  Output 1400 ml  Net -800 ml   Filed Weights   04/20/17 0500  Weight: 99.1 kg (218 lb 7.6 oz)    Examination:  General exam: Appears calm and comfortable  Respiratory system: Bilateral decreased breath sounds at bases Cardiovascular system: S1 & S2 heard, RRR. No JVD, murmurs, rubs, gallops. Gastrointestinal system: Abdomen is nondistended, soft and nontender. Normal bowel sounds heard. Central nervous system: Alert and oriented. No focal neurological deficits. Moving extremities.  Still has right lower extremity weakness Extremities: No cyanosis, clubbing; 1+ edema      Data Reviewed: I have personally reviewed following labs and imaging studies  CBC: Recent Labs  Lab 04/14/17 1550 04/18/17 1755 04/18/17 1800  WBC 3.9* 4.2  --   NEUTROABS 2.2 2.4  --   HGB 11.0* 12.0 12.2  HCT 34.1* 37.4 36.0  MCV 91.4 91.4  --   PLT 135* PLATELETS APPEAR ADEQUATE  --  Basic Metabolic Panel: Recent Labs  Lab 04/14/17 1550 04/18/17 1755 04/18/17 1800 04/20/17 0807  NA 141 137 139 137  K 4.2 5.0 5.1 5.1  CL 109 105 104 105  CO2 22 21*  --  21*  GLUCOSE 92 92 95 103*  BUN 41* 36* 45* 40*  CREATININE 1.64* 1.69* 1.70* 1.97*  CALCIUM 9.4 10.1  --  10.0   GFR: Estimated Creatinine Clearance: 26.8 mL/min (A) (by C-G formula based on SCr of 1.97 mg/dL (H)). Liver Function Tests: Recent Labs  Lab 04/14/17 1550 04/18/17 1755  AST 56* 46*  ALT 42 30  ALKPHOS 78 76    BILITOT 0.5 1.1  PROT 5.7* 6.5  ALBUMIN 3.7 3.8   No results for input(s): LIPASE, AMYLASE in the last 168 hours. No results for input(s): AMMONIA in the last 168 hours. Coagulation Profile: Recent Labs  Lab 04/18/17 1755  INR 1.02   Cardiac Enzymes: Recent Labs  Lab 04/19/17 0938  CKTOTAL 370*   BNP (last 3 results) No results for input(s): PROBNP in the last 8760 hours. HbA1C: Recent Labs    04/19/17 0424  HGBA1C 6.1*   CBG: Recent Labs  Lab 04/19/17 0222  GLUCAP 49   Lipid Profile: Recent Labs    04/19/17 0424  CHOL 102  HDL 48  LDLCALC 42  TRIG 59  CHOLHDL 2.1   Thyroid Function Tests: Recent Labs    04/19/17 0930  TSH 3.102   Anemia Panel: Recent Labs    04/19/17 [place]   Sepsis Labs: No results for input(s): PROCALCITON, LATICACIDVEN in the last 168 hours.  No results found for this or any previous visit (from the past 240 hour(s)).       Radiology Studies: Ct Head Wo Contrast  Result Date: 04/18/2017 CLINICAL DATA:  Right leg weakness EXAM: CT HEAD WITHOUT CONTRAST TECHNIQUE: Contiguous axial images were obtained from the base of the skull through the vertex without intravenous contrast. COMPARISON:  04/14/2017 FINDINGS: Brain: Mild atrophic changes are noted. Chronic white matter ischemic changes are seen. A focal lacunar infarct is noted in the right cerebellar hemisphere stable from the previous exam. No acute hemorrhage or acute infarction is seen. Vascular: No hyperdense vessel or unexpected calcification. Skull: Normal. Negative for fracture or focal lesion. Sinuses/Orbits: No acute finding. Other: None. IMPRESSION: Chronic atrophic and ischemic changes without acute abnormality. Electronically Signed   By: Inez Catalina M.D.   On: 04/18/2017 17:34   Mr Jodene Nam Head Wo Contrast  Result Date: 04/19/2017 CLINICAL DATA:  77 year old female with progressive right lower extremity weakness. Query recent stroke. EXAM: MRI HEAD  WITHOUT CONTRAST MRA HEAD WITHOUT CONTRAST TECHNIQUE: Multiplanar, multiecho pulse sequences of the brain and surrounding structures were obtained without intravenous contrast. Angiographic images of the head were obtained using MRA technique without contrast. COMPARISON:  Head CT without contrast 04/18/2017. Lumbar spine MRI today reported separately. FINDINGS: MRI HEAD FINDINGS Brain: There is an oval 12 millimeter area of restricted diffusion involving the posterior lamina of the left internal capsule on series 3, image 25. there is associated T2 and FLAIR hyperintensity with mild T1 hypointensity. No associated hemorrhage or mass effect. No other diffusion restriction. Numerous chronic small or lacunar infarcts are scattered in both cerebellar hemispheres, the pons, the bilateral deep gray matter nuclei, and the cerebral white matter including the body of the corpus callosum. No cortical encephalomalacia is identified. There are occasional chronic micro hemorrhages, including in the right thalamus and cerebellum.  No midline shift, mass effect, evidence of mass lesion, ventriculomegaly, extra-axial collection or acute intracranial hemorrhage. Cervicomedullary junction and pituitary are within normal limits. Vascular: Major intracranial vascular flow voids are preserved. See intracranial MRA findings below. Skull and upper cervical spine: Negative visible cervical spine. Normal bone marrow signal. Hyperostosis of the calvarium, normal variant. Sinuses/Orbits: Postoperative changes to the globes, otherwise normal orbits soft tissues. Paranasal sinuses and mastoids are stable and well pneumatized. Other: Visible internal auditory structures appear normal. Scalp and face soft tissues appear negative. MRA HEAD FINDINGS Tortuosity of the distal vertebral arteries likely in part responsible for apparent decreased flow signal in the proximal V4 segments. However, the left V4 segment may be stenotic. The right V4 is  dominant with no stenosis suspected. Patent basilar artery with mild irregularity and stenosis. Dominant AICAs suspected. Normal SCA origins. Fetal type PCA origins. Mildly irregular bilateral PCA branches. On the source images distal PCA flow signal appears symmetric. Antegrade flow signal in both ICA siphons. No siphon stenosis identified, but there is short segment of fusiform aneurysmal enlargement of the left cavernous ICA up to 8 mm diameter (series 4, image 57). The bilateral ophthalmic and posterior communicating artery origins are normal. Patent carotid termini. Dominant left ACA A1 segment. The right is mildly irregular and stenotic. Anterior communicating artery and ACA A2 segments appear normal. MCA M1 segments and MCA bifurcations are patent with mild irregularity but no significant stenosis. Visible MCA branches are patent with mild irregularity. IMPRESSION: 1. Acute to early subacute lacunar infarct in the posterior limb of left internal capsule. No associated hemorrhage or mass effect. 2. Severe underlying chronic small vessel disease. Chronic lacunar infarcts in the bilateral deep gray matter nuclei, brainstem, cerebellum, and cerebral white matter. 3. 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 4. Intracranial atherosclerosis. Hemodynamically significant stenosis is suspected only in the distal left vertebral artery. Electronically Signed   By: Genevie Ann M.D.   On: 04/19/2017 15:25   Mr Brain Wo Contrast  Result Date: 04/19/2017 CLINICAL DATA:  77 year old female with progressive right lower extremity weakness. Query recent stroke. EXAM: MRI HEAD WITHOUT CONTRAST MRA HEAD WITHOUT CONTRAST TECHNIQUE: Multiplanar, multiecho pulse sequences of the brain and surrounding structures were obtained without intravenous contrast. Angiographic images of the head were obtained using MRA technique without contrast. COMPARISON:  Head CT without contrast 04/18/2017. Lumbar spine  MRI today reported separately. FINDINGS: MRI HEAD FINDINGS Brain: There is an oval 12 millimeter area of restricted diffusion involving the posterior lamina of the left internal capsule on series 3, image 25. there is associated T2 and FLAIR hyperintensity with mild T1 hypointensity. No associated hemorrhage or mass effect. No other diffusion restriction. Numerous chronic small or lacunar infarcts are scattered in both cerebellar hemispheres, the pons, the bilateral deep gray matter nuclei, and the cerebral white matter including the body of the corpus callosum. No cortical encephalomalacia is identified. There are occasional chronic micro hemorrhages, including in the right thalamus and cerebellum. No midline shift, mass effect, evidence of mass lesion, ventriculomegaly, extra-axial collection or acute intracranial hemorrhage. Cervicomedullary junction and pituitary are within normal limits. Vascular: Major intracranial vascular flow voids are preserved. See intracranial MRA findings below. Skull and upper cervical spine: Negative visible cervical spine. Normal bone marrow signal. Hyperostosis of the calvarium, normal variant. Sinuses/Orbits: Postoperative changes to the globes, otherwise normal orbits soft tissues. Paranasal sinuses and mastoids are stable and well pneumatized. Other: Visible internal auditory structures appear normal.  Scalp and face soft tissues appear negative. MRA HEAD FINDINGS Tortuosity of the distal vertebral arteries likely in part responsible for apparent decreased flow signal in the proximal V4 segments. However, the left V4 segment may be stenotic. The right V4 is dominant with no stenosis suspected. Patent basilar artery with mild irregularity and stenosis. Dominant AICAs suspected. Normal SCA origins. Fetal type PCA origins. Mildly irregular bilateral PCA branches. On the source images distal PCA flow signal appears symmetric. Antegrade flow signal in both ICA siphons. No siphon  stenosis identified, but there is short segment of fusiform aneurysmal enlargement of the left cavernous ICA up to 8 mm diameter (series 4, image 57). The bilateral ophthalmic and posterior communicating artery origins are normal. Patent carotid termini. Dominant left ACA A1 segment. The right is mildly irregular and stenotic. Anterior communicating artery and ACA A2 segments appear normal. MCA M1 segments and MCA bifurcations are patent with mild irregularity but no significant stenosis. Visible MCA branches are patent with mild irregularity. IMPRESSION: 1. Acute to early subacute lacunar infarct in the posterior limb of left internal capsule. No associated hemorrhage or mass effect. 2. Severe underlying chronic small vessel disease. Chronic lacunar infarcts in the bilateral deep gray matter nuclei, brainstem, cerebellum, and cerebral white matter. 3. 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 4. Intracranial atherosclerosis. Hemodynamically significant stenosis is suspected only in the distal left vertebral artery. Electronically Signed   By: Genevie Ann M.D.   On: 04/19/2017 15:25   Mr Lumbar Spine Wo Contrast  Result Date: 04/19/2017 CLINICAL DATA:  approximately 1 month ago daughter noticed her mother requires more assistance moving her right leg while getting into the car. Last Thursday, daughter reports even worsening right leg weakness with patient being unable to get up from her bed without any support. EXAM: MRI LUMBAR SPINE WITHOUT CONTRAST TECHNIQUE: Multiplanar, multisequence MR imaging of the lumbar spine was performed. No intravenous contrast was administered. COMPARISON:  None. FINDINGS: Segmentation:  Standard. Alignment:  Physiologic. Vertebrae:  No fracture, evidence of discitis, or bone lesion. Conus medullaris and cauda equina: Conus extends to the L1 level. Conus and cauda equina appear normal. Paraspinal and other soft tissues: No acute paraspinal abnormality.  Right renal cyst. Disc levels: Disc spaces: Disc desiccation at L3-4, L4-5 and L5-S1. T12-L1: No significant disc bulge. No evidence of neural foraminal stenosis. No central canal stenosis. L1-L2: No significant disc bulge. No evidence of neural foraminal stenosis. No central canal stenosis. L2-L3: No significant disc bulge. No evidence of neural foraminal stenosis. No central canal stenosis. L3-L4: Mild broad-based disc bulge eccentric towards the right. Moderate bilateral facet arthropathy. Mild spinal stenosis. No evidence of neural foraminal stenosis. L4-L5: Broad-based disc bulge with an annular fissure. Moderate bilateral facet arthropathy with ligamentum flavum infolding. Bilateral lateral recess stenosis. No evidence of neural foraminal stenosis. No central canal stenosis. L5-S1: Broad-based disc bulge. No evidence of neural foraminal stenosis. No central canal stenosis. IMPRESSION: 1. At L4-5 there is a broad-based disc bulge with an annular fissure. Moderate bilateral facet arthropathy with ligamentum flavum infolding. Bilateral lateral recess stenosis. 2. At L3-4 there is a mild broad-based disc bulge eccentric towards the right. Moderate bilateral facet arthropathy. Mild spinal stenosis. Electronically Signed   By: Kathreen Devoid   On: 04/19/2017 15:45   [place] Ekg Site Rite  Result Date: 04/19/2017 If Site Rite image not attached, placement could not be confirmed due to current cardiac rhythm.  Scheduled Meds: .  stroke: mapping our early stages of recovery book   Does not apply Once  . allopurinol  200 mg Oral Daily  . aspirin EC  325 mg Oral Daily  . atorvastatin  80 mg Oral q1800  . carvedilol  25 mg Oral BID WC  . furosemide  40 mg Oral Daily  . gabapentin  300 mg Oral TID  . isosorbide-hydrALAZINE  2 tablet Oral BID  . pantoprazole  40 mg Oral Daily  . sacubitril-valsartan  1 tablet Oral BID  . spironolactone  25 mg Oral Daily   Continuous Infusions:   LOS: 0 days         Aline August, MD [facility] Pager [phone]  If 7PM-7AM, please contact night-coverage [email or web address] Password [email or web address] 04/20/2017, 10:17 AM

## 2017-04-20 NOTE — Evaluation (Signed)
Occupational Therapy Evaluation Patient Details Name: Tara Dunn MRN: [id number] DOB: July 15, 1940 Today's Date: 04/20/2017    History of Present Illness Patient is a 77 y/o female who presents with RLE weakness, facial droop, dysarthria and fall. Head CT-unremarkable. BRain MRI-acute to subacute lacunar infarct in posterior limb of left internal capsule. Lumbar MRI- L3-4, 4-5 disc bulge. PMH includes stroke, HTN, depression, OSA, CKD CHF, AICD.    Clinical Impression   Pt admitted with the above diagnoses and presents with below problem list. Pt will benefit from continued acute OT to address the below listed deficits and maximize independence with ADLs prior to d/c to venue below. PTA pt has recently been needing some assist with bathing and uses a rw due to weakness. Pt is currently mod - max +2 A for LB ADLs and functional transfers.  Given acute change in level of assist with ADLs, feel pt would benefit from CIR prior to returning home.      Follow Up Recommendations  CIR    Equipment Recommendations  Other (comment)(defer to next venue)    Recommendations for Other Services       Precautions / Restrictions Precautions Precautions: Fall Restrictions Weight Bearing Restrictions: No      Mobility Bed Mobility Overal bed mobility: Needs Assistance Bed Mobility: Rolling;Sidelying to Sit Rolling: Min guard Sidelying to sit: Min assist;HOB elevated       General bed mobility comments: Increased time to get to EOB with use of rail and assist to elevate trunk as pt not able to push through RUE.   Transfers Overall transfer level: Needs assistance Equipment used: Rolling walker (2 wheeled) Transfers: Sit to/from [facility] Sit to Stand: Mod assist;+2 safety/equipment Stand pivot transfers: Mod assist;+2 physical assistance       General transfer comment: Assist to power to standing wtih cues for hand placement, despite pulling up on RW. Stood from  EOB x1, from [facility] x1, SPT bed to/from [facility] and bed to chair with mod A for balance, RW sequencing. Difficulty advancing LLE and WB through RLE. Increased knee flexion bilaterally.    Balance Overall balance assessment: Needs assistance;History of Falls Sitting-balance support: Feet supported;No upper extremity supported Sitting balance-Leahy Scale: Fair     Standing balance support: During functional activity;Single extremity supported Standing balance-Leahy Scale: Poor Standing balance comment: Requires Mod A for dynamic standing balance for pericare due to posterior lean and bil knee weakness.                            ADL either performed or assessed with clinical judgement   ADL Overall ADL's : Needs assistance/impaired Eating/Feeding: Set up;Sitting   Grooming: Set up;Sitting   Upper Body Bathing: Set up;Sitting;Minimal assistance   Lower Body Bathing: Maximal assistance;Sit to/from stand;+2 for safety/equipment   Upper Body Dressing : Minimal assistance;Set up;Sitting   Lower Body Dressing: Maximal assistance;+2 for safety/equipment;Sit to/from stand   Toilet Transfer: Moderate assistance;+2 for physical assistance;Stand-pivot;BSC;RW   Toileting- Clothing Manipulation and Hygiene: Moderate assistance;+2 for safety/equipment;Sit to/from stand   Tub/ Banker: Moderate assistance;Stand-pivot;+2 for physical assistance     General ADL Comments: Pt completed bed mobility, SPT EOB>BSC>EOB>recliner.      Vision Baseline Vision/History: Wears glasses Wears Glasses: At all times Patient Visual Report: Blurring of vision Additional Comments: Pt reports blurred vision with onset this week. Pt did not have glasses in her room. Would benefit from further visioni eval.  Perception     Praxis      Pertinent Vitals/Pain Pain Assessment: No/denies pain     Hand Dominance Right   Extremity/Trunk Assessment Upper Extremity Assessment Upper Extremity  Assessment: Generalized weakness;RUE deficits/detail;LUE deficits/detail RUE Deficits / Details: mild FMC deficits, pt reports baseline LUE Deficits / Details: mild FMC deficits, pt reports baseline   Lower Extremity Assessment Lower Extremity Assessment: Defer to PT evaluation RLE Deficits / Details: Grossly ~3/5 knee extension, 2+/5 knee flexion, 1/5 hip flexion. RLE Sensation: decreased light touch RLE Coordination: decreased fine motor;decreased gross motor LLE Deficits / Details: Grossly ~3/5 throughout. LLE Sensation: decreased light touch       Communication Communication Communication: Expressive difficulties   Cognition Arousal/Alertness: Awake/alert Behavior During Therapy: WFL for tasks assessed/performed Overall Cognitive Status: No family/caregiver present to determine baseline cognitive functioning                                 General Comments: A&Ox4. Not formally assessed.   General Comments  Daughter stepped out for session.    Exercises     Shoulder Instructions      Home Living Family/patient expects to be discharged to:: Private residence Living Arrangements: Alone;Children Available Help at Discharge: Family;Available 24 hours/day Type of Home: Apartment Home Access: Level entry     Home Layout: One level     Bathroom Shower/Tub: Occupational psychologist: Standard     Home Equipment: Environmental consultant - 2 wheels;Cane - single point;Bedside commode          Prior Functioning/Environment Level of Independence: Needs assistance  Gait / Transfers Assistance Needed: Uses RW for ambulation; has needed more help recently due to weakness. ADL's / Homemaking Assistance Needed: Assist with bathing from daughter.   Comments: Has been staying with daughter for the last week.        OT Problem List: Impaired balance (sitting and/or standing);Decreased strength;Decreased range of motion;Decreased coordination;Decreased  cognition;Decreased safety awareness;Decreased knowledge of use of DME or AE;Decreased knowledge of precautions;Impaired sensation;Impaired UE functional use      OT Treatment/Interventions: Self-care/ADL training;Therapeutic exercise;Neuromuscular education;DME and/or AE instruction;Therapeutic activities;Cognitive remediation/compensation;Visual/perceptual remediation/compensation;Patient/family education;Balance training    OT Goals(Current goals can be found in the care plan section) Acute Rehab OT Goals Patient Stated Goal: to return to PLOF and be able to walk OT Goal Formulation: With patient Time For Goal Achievement: 05/04/17 Potential to Achieve Goals: Good ADL Goals Pt Will Perform Grooming: standing;with min assist Pt Will Perform Upper Body Bathing: with set-up;sitting Pt Will Perform Lower Body Bathing: with min guard assist;sit to/from stand Pt Will Perform Upper Body Dressing: with set-up;sitting Pt Will Perform Lower Body Dressing: with min guard assist;sit to/from stand Pt Will Transfer to Toilet: with min guard assist;ambulating Pt Will Perform Toileting - Clothing Manipulation and hygiene: with min guard assist;sit to/from stand Additional ADL Goal #1: Pt will complete bed mobility at supervision level to prepare for OOB ADLs.  OT Frequency: Min 3X/week   Barriers to D/C:            Co-evaluation PT/OT/SLP Co-Evaluation/Treatment: Yes Reason for Co-Treatment: For patient/therapist safety;To address functional/ADL transfers   OT goals addressed during session: ADL's and self-care      AM-PAC PT "6 Clicks" Daily Activity     Outcome Measure Help from another person eating meals?: None Help from another person taking care of personal grooming?: A Little Help from another person  toileting, which includes using toliet, bedpan, or urinal?: A Lot Help from another person bathing (including washing, rinsing, drying)?: A Lot Help from another person to put on and  taking off regular upper body clothing?: A Little Help from another person to put on and taking off regular lower body clothing?: A Lot 6 Click Score: 16   End of Session Equipment Utilized During Treatment: Gait belt;Rolling walker Nurse Communication: Mobility status  Activity Tolerance: Patient tolerated treatment well Patient left: in chair;with call bell/phone within reach;with chair alarm set  OT Visit Diagnosis: Unsteadiness on feet (R26.81);Muscle weakness (generalized) (M62.81);Hemiplegia and hemiparesis;Cognitive communication deficit (R41.841);Other symptoms and signs involving cognitive function                Time: 5625-6389 OT Time Calculation (min): 24 min Charges:  OT General Charges $OT Visit: 1 Visit OT Evaluation $OT Eval Low Complexity: 1 Low G-Codes:       Hortencia Pilar 04/20/2017, 12:59 PM

## 2017-04-20 NOTE — Progress Notes (Signed)
Rehab Admissions Coordinator Note:  Patient was screened by Cleatrice Burke for appropriateness for an Inpatient Acute Rehab Consult per PT recommendation.   At this time, we are recommending Inpatient Rehab consult.  Cleatrice Burke 04/20/2017, 12:58 PM  I can be reached at [phone].

## 2017-04-20 NOTE — Progress Notes (Signed)
  Echocardiogram 2D Echocardiogram has been performed.  Jennette Dubin 04/20/2017, 9:54 AM

## 2017-04-21 ENCOUNTER — Other Ambulatory Visit: Payer: Self-pay

## 2017-04-21 LAB — CBC WITH DIFFERENTIAL/PLATELET
Basophils Absolute: 0 10*3/uL (ref 0.0–0.1)
Basophils Relative: 1 %
Eosinophils Absolute: 0.2 10*3/uL (ref 0.0–0.7)
Eosinophils Relative: 4 %
HCT: 39.4 % (ref 36.0–46.0)
HEMOGLOBIN: 12.6 g/dL (ref 12.0–15.0)
LYMPHS ABS: 1.2 10*3/uL (ref 0.7–4.0)
LYMPHS PCT: 32 %
MCH: 29.4 pg (ref 26.0–34.0)
MCHC: 32 g/dL (ref 30.0–36.0)
MCV: 92.1 fL (ref 78.0–100.0)
Monocytes Absolute: 0.5 10*3/uL (ref 0.1–1.0)
Monocytes Relative: 12 %
NEUTROS PCT: 51 %
Neutro Abs: 1.8 10*3/uL (ref 1.7–7.7)
Platelets: 144 10*3/uL — ABNORMAL LOW (ref 150–400)
RBC: 4.28 MIL/uL (ref 3.87–5.11)
RDW: 16.8 % — ABNORMAL HIGH (ref 11.5–15.5)
WBC: 3.6 10*3/uL — AB (ref 4.0–10.5)

## 2017-04-21 LAB — BASIC METABOLIC PANEL
ANION GAP: 11 (ref 5–15)
BUN: 39 mg/dL — AB (ref 6–20)
CO2: 23 mmol/L (ref 22–32)
Calcium: 10.2 mg/dL (ref 8.9–10.3)
Chloride: 101 mmol/L (ref 101–111)
Creatinine, Ser: 1.74 mg/dL — ABNORMAL HIGH (ref 0.44–1.00)
GFR calc Af Amer: 31 mL/min — ABNORMAL LOW (ref >60)
GFR calc non Af Amer: 27 mL/min — ABNORMAL LOW (ref >60)
GLUCOSE: 99 mg/dL (ref 65–99)
POTASSIUM: 4.5 mmol/L (ref 3.5–5.1)
Sodium: 135 mmol/L (ref 135–145)

## 2017-04-21 LAB — MAGNESIUM: Magnesium: 1.7 mg/dL (ref 1.7–2.4)

## 2017-04-21 NOTE — Progress Notes (Signed)
Inpatient Rehabilitation  After further discuss with team daughter agreeable to intensive therapies for maximum functional recovery for her mom.  Plan is for patient to return home with daughter upon discharge.  Will initiate insurance authorization and follow for timing of medical readiness, insurance decision, and IP Rehab bed availability.  Call if questions.   Carmelia Roller., CCC/SLP Admission Coordinator  [place]  Cell [phone]

## 2017-04-21 NOTE — PMR Pre-admission (Signed)
PMR Admission Coordinator Pre-Admission Assessment  Patient: Tara Dunn is an 77 y.o., female MRN: [id number] DOB: 09-Jul-1940 Height:  5'3" Weight: 99 kg (218 lb 4.1 oz)              Insurance Information HMO:     PPO: X     PCP:      IPA:      80/20:      OTHER:  PRIMARY: [employer]       Policy#: [id number]      Subscriber: Self CM Name: Helene Kelp      Phone#: [phone]     Fax#: [phone] Pre-Cert#: [id number] for 10 days      Employer: Retired  Benefits:  Phone #: [phone]     Name: Verified online HTA portal  Eff. Date: 02/08/17     Deduct: $0      Out of Pocket Max: $3400      Life Max: N/A CIR: $295 a day, days 1-6; $0 a day, days 7-90      SNF: $20 a day, days 1-20; $160 a day, days 21-100 Outpatient: Necessity      Co-Pay: $15 per visit  Home Health: Necessity, 100%      Co-Pay: $0 DME: 80%     Co-Pay: 20% Providers: In-network   SECONDARY: None        Medicaid Application Date:       Case Manager:  Disability Application Date:       Case Worker:   Emergency Contact Information Contact Information    Name Relation Home Work [place] Daughter [phone]  [phone]     Current Medical History  Patient Admitting Diagnosis: Lacunar infarct posterior limb of left internal   History of Present Illness: Tara Dunn a 77 y.o.right handed femalewith history of left breast cancer with mastectomy chemotherapy 2009, chronic right lower extremity edema, cardiomyopathy with AICD placement 2017 per Dr. Lovena Le, history of chronic combined systolic and diastolic congestive heart failure, chronic kidney disease stage III, hypertension and CVA3 with minimal left side weakness andmaintained on aspirin 81 mg daily. Per chart review and patient,patient lives alone however she has been staying with her daughter for the past couple weeks she was needing more assistance with mobility using a rolling walker for ambulation.She states she  needed assistance with all ADLs just prior to her admission.  One level home. Presented 04/19/2017 with right-sided weakness as well as slurred speech. Creatinine 1.69, EKG sinus rhythm with first degree AV nodal block. Troponin negative.CT unremarkable for acute process.MRI as well as MRA showed acute early subacute lacunar infarct posterior limb of left internal capsule. No associated hemorrhage or mass effect. 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 MRI lumbar spine showed L3-4 and L4-5 broad-based disc bulge with annular fissure. Mild spinal stenosis. Patient did not receive TPA. AICD was interrogated with no findings. Echocardiogram with ejection fraction of 84% grade 1 diastolic dysfunction. Neurology consulted presently on aspirin daily and Plavix for CVA prophylaxis 3 weeks then Plavix alone. Tolerating a regular diet. Physical and occupational therapy evaluations completed 04/20/2017 with recommendations of physical medicine rehabilitation consult patient was admitted for a comprehensive rehabilitation program 04/22/17.   NIHTotal: 5    Past Medical History  Past Medical History:  Diagnosis Date  . 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 Lovena Le  . Aortic dilatation ([place])    a. 04/2014 CTA chest w/ incidental finding of distal thoracic Ao  enlargement of 3.39 mm - f/u needed 04/2015.  . Arthritis    "all over my body"  . Cancer of left breast ([place]) 2009   s/p L mastectomy and chemo.  . Cardiomyopathy ([place])    a. 04/2014 Echo: EF 25-30%, mod conc LVH, possibl antsept HK, Gr1 DD, mod-sev dil LA.  . CHF (congestive heart failure) ([place])   . CKD (chronic kidney disease), stage III ([place])   . Depression   . GERD (gastroesophageal reflux disease)   . Gout   . Heart murmur   . History of stomach ulcers   . Hypertension    a. Dx @ age 80;  b. 04/2014 admission for HTN emergency.  . Obesity (BMI 30-39.9) 01/21/2015  . OSA on CPAP    . Stroke [facility])    a. 3 strokes - last in 1998; "memory problems since"  (03/26/2015)    Family History  family history includes Cancer in her daughter and sister; Heart attack in her father; Heart disease in her daughter; High blood pressure in her father, mother, and sister.  Prior Rehab/Hospitalizations:  Has the patient had major surgery during 100 days prior to admission? No  Current Medications   Current Facility-Administered Medications:  .   stroke: mapping our early stages of recovery book, , Does not apply, Once, Opyd, Ilene Qua, MD .  acetaminophen (TYLENOL) tablet 650 mg, 650 mg, Oral, Q4H PRN **OR** acetaminophen (TYLENOL) solution 650 mg, 650 mg, Per Tube, Q4H PRN **OR** acetaminophen (TYLENOL) suppository 650 mg, 650 mg, Rectal, Q4H PRN, Opyd, Timothy S, MD .  allopurinol (ZYLOPRIM) tablet 200 mg, 200 mg, Oral, Daily, Opyd, Ilene Qua, MD, 200 mg at 04/22/17 0923 .  aspirin EC tablet 81 mg, 81 mg, Oral, Daily, Rosalin Hawking, MD, 81 mg at 04/22/17 1761 .  atorvastatin (LIPITOR) tablet 80 mg, 80 mg, Oral, q1800, Oretha Milch D, MD, 80 mg at 04/21/17 1721 .  carvedilol (COREG) tablet 25 mg, 25 mg, Oral, BID WC, Alekh, Kshitiz, MD, 25 mg at 04/22/17 0744 .  clopidogrel (PLAVIX) tablet 75 mg, 75 mg, Oral, Daily, Rosalin Hawking, MD, 75 mg at 04/22/17 6073 .  gabapentin (NEURONTIN) capsule 300 mg, 300 mg, Oral, TID, Opyd, Ilene Qua, MD, 300 mg at 04/22/17 0923 .  isosorbide-hydrALAZINE (BIDIL) 20-37.5 MG per tablet 2 tablet, 2 tablet, Oral, BID, Opyd, Ilene Qua, MD, 2 tablet at 04/21/17 2221 .  pantoprazole (PROTONIX) EC tablet 40 mg, 40 mg, Oral, Daily, Opyd, Ilene Qua, MD, 40 mg at 04/22/17 0923 .  senna-docusate (Senokot-S) tablet 1 tablet, 1 tablet, Oral, QHS PRN, Opyd, Ilene Qua, MD  Patients Current Diet: Fall precautions Diet Heart Room service appropriate? Yes; Fluid consistency: Thin  Precautions / Restrictions Precautions Precautions: Fall Restrictions Weight Bearing  Restrictions: No   Has the patient had 2 or more falls or a fall with injury in the past year?Yes  Prior Activity Level Limited Community (1-2x/wk): Prior to 2 weeks ago patient lived alone in an [place] on [address], and was independent with self-care and home management tasks except for loading her medication box.  She has a home health RN, who loaded her medication box for her on Thursdays. Her daughter drove her to run errands as needed.     Home Assistive Devices / Equipment Home Assistive Devices/Equipment: Nebulizer, Eyeglasses, Dentures (specify type), Walker (specify type), Cane (specify quad or straight), Grab bars in shower, Shower chair without back, Other (Comment)("pullups") Home Equipment: Environmental consultant - 2 wheels, [employer] -  single point, Bedside commode  Prior Device Use: Indicate devices/aids used by the patient prior to current illness, exacerbation or injury? Walker  Prior Functional Level Prior Function Level of Independence: Needs assistance Gait / Transfers Assistance Needed: Uses RW for ambulation; has needed more help recently due to weakness. ADL's / Homemaking Assistance Needed: Assist with bathing from daughter. Comments: Has been staying with daughter for the last week.  Self Care: Did the patient need help bathing, dressing, using the toilet or eating?Independent  Indoor Mobility: Did the patient need assistance with walking from room to room (with or without device)? Independent  Stairs: Did the patient need assistance with internal or external stairs (with or without device)? Dependent  Functional Cognition: Did the patient need help planning regular tasks such as shopping or remembering to take medications? Needed some help  Current Functional Level Cognition  Overall Cognitive Status: No family/caregiver present to determine baseline cognitive functioning Orientation Level: Oriented to person, Oriented to place, Oriented to time General  Comments: A&Ox4. Not formally assessed. Attention: Sustained Sustained Attention: Appears intact    Extremity Assessment (includes Sensation/Coordination)  Upper Extremity Assessment: Generalized weakness, RUE deficits/detail, LUE deficits/detail RUE Deficits / Details: mild FMC deficits, pt reports baseline LUE Deficits / Details: mild FMC deficits, pt reports baseline  Lower Extremity Assessment: Defer to PT evaluation RLE Deficits / Details: Grossly ~3/5 knee extension, 2+/5 knee flexion, 1/5 hip flexion. RLE Sensation: decreased light touch RLE Coordination: decreased fine motor, decreased gross motor LLE Deficits / Details: Grossly ~3/5 throughout. LLE Sensation: decreased light touch    ADLs  Overall ADL's : Needs assistance/impaired Eating/Feeding: Set up, Sitting Grooming: Set up, Sitting Upper Body Bathing: Set up, Sitting, Minimal assistance Lower Body Bathing: Maximal assistance, Sit to/from stand, +2 for safety/equipment Upper Body Dressing : Minimal assistance, Set up, Sitting Lower Body Dressing: Maximal assistance, +2 for safety/equipment, Sit to/from stand Toilet Transfer: Moderate assistance, +2 for physical assistance, Stand-pivot, BSC, RW Toileting- Clothing Manipulation and Hygiene: Moderate assistance, +2 for safety/equipment, Sit to/from stand Tub/ Shower Transfer: Moderate assistance, Stand-pivot, +2 for physical assistance General ADL Comments: Pt completed bed mobility, SPT EOB>BSC>EOB>recliner.     Mobility  Overal bed mobility: Needs Assistance Bed Mobility: Rolling, Sidelying to Sit Rolling: Min guard Sidelying to sit: Min assist, HOB elevated General bed mobility comments: Increased time to get to EOB with use of rail and assist to elevate trunk as pt not able to push through RUE.     Transfers  Overall transfer level: Needs assistance Equipment used: Rolling walker (2 wheeled) Transfers: Sit to/from Stand, [employer] Transfers Sit to Stand: Mod  assist, +2 safety/equipment Stand pivot transfers: Mod assist, +2 physical assistance General transfer comment: Assist to power to standing wtih cues for hand placement, despite pulling up on RW. Stood from EOB x1, from [facility] x1, SPT bed to/from [facility] and bed to chair with mod A for balance, RW sequencing. Difficulty advancing LLE and WB through RLE. Increased knee flexion bilaterally.    Ambulation / Gait / Stairs / Office manager / Balance Balance Overall balance assessment: Needs assistance, History of Falls Sitting-balance support: Feet supported, No upper extremity supported Sitting balance-Leahy Scale: Fair Standing balance support: During functional activity, Single extremity supported Standing balance-Leahy Scale: Poor Standing balance comment: Requires Mod A for dynamic standing balance for pericare due to posterior lean and bil knee weakness.     Special needs/care consideration BiPAP/CPAP: Yes CPAP at night  CPM: No Continuous Drip IV: No Dialysis: No         Life Vest: No Oxygen: No Special Bed: No Trach Size: No Wound Vac (area): No       Skin: Bruising to bilateral arms                                Bowel mgmt: Continent, last BM 04/20/17  Bladder mgmt: Incontinent intermittently with external foley  Diabetic mgmt: No     Previous Home Environment Living Arrangements: Alone  Lives With: Alone Available Help at Discharge: Family, Available 24 hours/day Type of Home: Apartment Home Layout: One level Home Access: Level entry(3rd floor apartment accessed via elevator ) Bathroom Shower/Tub: Multimedia programmer: Standard Bathroom Accessibility: (TBD) Home Care Services: Yes Type of Home Care Services: Home RN(loaded med box on Thursdays) [place] (if known): Daughter reports that they were recently established with [facility]  Discharge Living Setting Plans for Discharge Living Setting: Other (Comment)(Going to  stay with daughter until she can return to her apt.) Type of Home at Discharge: House Discharge Home Layout: One level Discharge Home Access: Other (comment)(side entry, threshold ) Discharge Bathroom Shower/Tub: Walk-in shower Discharge Bathroom Toilet: Standard Discharge Bathroom Accessibility: Yes How Accessible: Accessible via walker Does the patient have any problems obtaining your medications?: No  Social/Family/Support Systems Patient Roles: Parent, Other (Comment)(Grandparent ) Contact Information: Daughter: [name]  Anticipated Caregiver: [name] Anticipated Caregiver's Contact Information: Cell:[phone] Ability/Limitations of Caregiver: None Caregiver Availability: 24/7 Discharge Plan Discussed with Primary Caregiver: Yes Is Caregiver In Agreement with Plan?: Yes Does Caregiver/Family have Issues with Lodging/Transportation while Pt is in Rehab?: No  Goals/Additional Needs Patient/Family Goal for Rehab: PT/OT: Supervision-Min A; SLP: Supervision  Expected length of stay: 14-17 days  Cultural Considerations: None Dietary Needs: Heart Healthy diet restrictions  Equipment Needs: TBD Pt/Family Agrees to Admission and willing to participate: Yes Program Orientation Provided & Reviewed with Pt/Caregiver Including Roles  & Responsibilities: Yes  Decrease burden of Care through IP rehab admission: No  Possible need for SNF placement upon discharge: No  Patient Condition: This patient's medical and functional status has changed since the consult dated 04/20/17 in which the Rehabilitation Physician determined and documented that the patient was potentially appropriate for intensive rehabilitative care in an inpatient rehabilitation facility. Issues have been addressed and update has been discussed with Dr. Naaman Plummer and patient now appropriate for inpatient rehabilitation. Will admit to inpatient rehab today.   Preadmission Screen Completed By:  Gunnar Fusi, 04/22/2017 2:46  PM ______________________________________________________________________   Discussed status with Dr. Naaman Plummer on 04/22/17 at 1450 and received telephone approval for admission today.  Admission Coordinator:  Gunnar Fusi, time 1450/Date 04/22/17

## 2017-04-21 NOTE — Progress Notes (Addendum)
Inpatient Rehabilitation  Met with patient and granddaughter at bedside and daughter via phone to discuss team's recommendation for IP Rehab.  Shared booklets and answered questions.  Clarified that up until 2 weeks ago patient was independent at home alone until her symptoms started, which we now know were due to a CVA.  She went to live with her daughter to get help up until she was hospitalized.  All were in initial agreement with plan for patient to discharge to daughter's home after CIR.  However, I received a follow up call from daughter stating that after further family discussion patient is not able to stay with her upon discharge.  Discussed that either rehab has a set duration at which point the patient will be discharged home with family to provide assist.  CSW notified to also discuss SNF options with her.  Will continue to follow for timing of medical readiness as well as most appropriate post acute rehab venue in order to maximize functional independence.  Call if questions.    Carmelia Roller., CCC/SLP Admission Coordinator  [place]  Cell [phone]

## 2017-04-21 NOTE — Progress Notes (Addendum)
STROKE TEAM PROGRESS NOTE   SUBJECTIVE (INTERVAL HISTORY) Her daughter is at the bedside.  Patient is sitting up in the chair at the bedside. Both states she has been stable over night with nothing new to reports since yesterday morning. AICD was interrogated with no findings. Plans are for IP rehab as recommended.    OBJECTIVE Vitals:   04/20/17 1949 04/20/17 2345 04/21/17 0327 04/21/17 0755  BP: 114/68 101/64  (!) 104/58  Pulse: 76 74 77 81  Resp: 18 18 18 18   Temp: 98.7 F (37.1 C) 98.5 F (36.9 C) 97.7 F (36.5 C) (!) 97.5 F (36.4 C)  TempSrc: Oral Oral Oral Oral  SpO2: 100% 100% 99% 100%  Weight:   99 kg (218 lb 4.1 oz)      CBC:  Recent Labs  Lab 04/18/17 1755 04/18/17 1800 04/21/17 0507  WBC 4.2  --  3.6*  NEUTROABS 2.4  --  1.8  HGB 12.0 12.2 12.6  HCT 37.4 36.0 39.4  MCV 91.4  --  92.1  PLT PLATELETS APPEAR ADEQUATE  --  144*    Basic Metabolic Panel:  Recent Labs  Lab 04/20/17 0807 04/21/17 0507  NA 137 135  K 5.1 4.5  CL 105 101  CO2 21* 23  GLUCOSE 103* 99  BUN 40* 39*  CREATININE 1.97* 1.74*  CALCIUM 10.0 10.2  MG  --  1.7    Lipid Panel:     Component Value Date/Time   CHOL 102 04/19/2017 0424   TRIG 59 04/19/2017 0424   HDL 48 04/19/2017 0424   CHOLHDL 2.1 04/19/2017 0424   VLDL 12 04/19/2017 0424   LDLCALC 42 04/19/2017 0424   HgbA1c:  Lab Results  Component Value Date   HGBA1C 6.1 (H) 04/19/2017   Urine Drug Screen: No results found for: LABOPIA, COCAINSCRNUR, LABBENZ, AMPHETMU, THCU, LABBARB  Alcohol Level No results found for: ETH  IMAGING Ct Head Wo Contrast 04/18/2017 Chronic atrophic and ischemic changes without acute abnormality.   Mr Brain 69 Contrast Mr Jodene Nam Head Wo Contrast 04/19/2017  1. Acute to early subacute lacunar infarct in the posterior limb of left internal capsule. No associated hemorrhage or mass effect. 2. Severe underlying chronic small vessel disease. Chronic lacunar infarcts in the bilateral deep gray  matter nuclei, brainstem, cerebellum, and cerebral white matter. 3. 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 4. Intracranial atherosclerosis. Hemodynamically significant stenosis is suspected only in the distal left vertebral artery.   Mr Lumbar Spine Wo Contrast 04/19/2017 1. At L4-5 there is a broad-based disc bulge with an annular fissure. Moderate bilateral facet arthropathy with ligamentum flavum infolding. Bilateral lateral recess stenosis. 2. At L3-4 there is a mild broad-based disc bulge eccentric towards the right. Moderate bilateral facet arthropathy. Mild spinal stenosis.    PHYSICAL EXAM Patient awake, alert, oriented x 3. Mild dysarthria. No aphasia. Able to follow commands. Extraocular movements intact. VFF. Mild R lower facial weakness. Right upper extremity with 4+/5 strength. Weakness in BLE, L > R with RLE 1/5 strength and LLE 2/5 strength.  Moves left arm without difficulty, strength normal. Left arm orbits right. Decreased finger taps right hand. Plantars down on the right, up on  the left. Decreased sensation on the right side of the body improveing. Heart rate is regular. Breath sounds are clear.   ASSESSMENT/PLAN Tara Dunn is a 77 y.o. female with history of breast cancer, cardiomyopathy status post ICD, systolic heart failure, CKD, pior  CVA, hypertension with progressive RLE weakness presenting with worsening of the RLE weakness. She did not receive IV t-PA.   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 Small vessel disease. Multiple chronic lacunes. Atrophy.   MRA head L ICA fusiform aneurysm. L VA w/ hemodynamic stenosis.  MR LSL4-5 broad-based disc bulge w/ annular fissure. L3-4 mild broad-based disc bulge to the R. Mod facet arthropathy. Mild spinal stenosis  Carotid Doppler  B ICA 1-39%  stenosis, VAs antegrade   2D Echo  EF 45-50%. No source of embolus (improved EF since 06/2016)  LDL 42  HgbA1c 6.1  SCDs for VTE prophylaxis Fall precautions Diet Heart Room service appropriate? Yes; Fluid consistency: Thin  aspirin 81 mg daily prior to admission, now on aspirin 81 mg daily and clopidogrel 75 mg daily. Continued x 3 weeks then plavix alone.  Patient counseled to be compliant with her antithrombotic medications  Ongoing aggressive stroke risk factor management  Therapy recommendations:  CIR  Disposition:  Pending, CIR planned (was at home alone w/ max home health services)  At discharge, follow up with Dr. Posey Pronto and [name] (as PTA)  Hypertension  BP variable - 91/51 - 125-59 Long-term BP goal normotensive  Hyperlipidemia  Home meds:  liptitor 40  LDL 42, goal < 70  Now on lipitor 80  Continue statin at discharge  Other Stroke Risk Factors  Advanced age  Former Cigarette smoker  UDS / ETOH level not performed   Obesity, Body mass index is 38.66 kg/m., recommend weight loss, diet and exercise as appropriate   Hx stroke/TIA  Reported stroke x 3, last in 1998 with "memory problems since". Unable to confirm in [facility]  Obstructive sleep apnea, on CPAP at home  Chronic combined systolic and diastolic CHF, Cardiomyopathy w/ EF 25-30% in 2016. [employer] AICD interrogated - no AF, no abnormalities. Device functional.   Other Active Problems  Stage III CKD  GERD  Hx L breast CA s/p mastectomy and chemo 2009  Neuropathy - followed by Dr. 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 Stroke will sign off. Continue dual antiplatelet for 3 weeks and then Plavix alone.  OP f/u of fusiform aneurysm. Please have pt f/u Dr. Posey Pronto of [facility] neurology.   Hospital day # [place] for Pager information 04/21/2017 10:33 AM   ATTENDING NOTE: I reviewed above note and agree with the assessment and plan. I have made any additions or clarifications directly to the above note. Pt was seen and examined.   78 year old female with history of cardiomyopathy, CHF status post AICD, CKD, HTN, obesity, OSA on CPAP, left breast cancer status post left mastectomy and chemo and radiation in remission, and stroke in 1998 immediate for right leg weakness.  Patient has been having bilateral leg weakness for some time due to gout and ankle deformity, following with Dr. Posey Pronto at [facility] neurology.  This time he had significant weakness on the right lower extremity.  CT no acute abnormality, MRI showed left internal capsule acute infarct, also remote lacunar infarct in the bilateral BG, cerebellum and CR as well as brainstem.  MRA showed short segment fusiform aneurysm of  cavernous left ICA 8 mm, likely due to atherosclerosis, do not feel needs intervention at this time, but warrant outpt follow up.  Carotid Doppler negative, EF 45 to 50% much improved from prior.  A1c 6.1 and LDL 42.   Patient stroke most likely small vessel disease, AICD interrogation ruled out A. fib at this time.  Patient follows with Dr. Lovena Le in the cardiology.  Patient on aspirin 81 and Lipitor at home.  Recommend dual antiplatelet for 3 weeks and then Plavix alone.  Continue Lipitor.  PT OT recommend CIR.  Neurology will sign off. Please call with questions. Pt will follow up with Dr. Posey Pronto at [facility] in about 4 weeks. Thanks for the consult.  Rosalin Hawking, MD PhD Stroke Neurology 04/21/2017 5:18 PM    To contact Stroke Continuity provider, please refer to [email or web address] After hours, contact General Neurology

## 2017-04-21 NOTE — H&P (Signed)
Physical Medicine and Rehabilitation Admission H&P    Chief Complaint  Patient presents with  . Extremity Weakness  : HPI: Tara Dunn is a 77 y.o. right handed female with history of left breast cancer with mastectomy chemotherapy 2009, chronic right lower extremity edema, cardiomyopathy with AICD placement 2017 per Dr. Lovena Le, history of chronic combined systolic and diastolic congestive heart failure, chronic kidney disease stage III, hypertension and CVA 3 with minimal left side weakness and maintained on aspirin 81 mg daily. Per chart review and patient, patient lives alone however she has been staying with her daughter for the past week she was needing more assistance with mobility using a rolling walker for ambulation. She states she needed  assistance wiht all ADLs PTA.  One level home. Presented 04/19/2017 with right-sided weakness as well as slurred speech. Creatinine 1.69, EKG sinus rhythm with first degree AV nodal block.. Troponin negative. CT reviewe, unremarkable for acute process. MRI as well as MRA showed acute early subacute lacunar infarct posterior limb of left internal capsule. No associated hemorrhage or mass effect. 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 MRI lumbar spine showed L3-4 and L4-5 broad-based disc bulge with annular fissure. Mild spinal stenosis. Patient did not receive TPA. AICD was interrogated with no findings. Echocardiogram with ejection fraction of 523%grade 1 diastolic dysfunction. Neurology consulted presently on aspirin  daily and Plavix for CVA prophylaxis 3 weeks then Plavix alone. Blood pressures a bit soft with Lasix and  Entresto held and resume as tolerated. Tolerating a regular diet. Physical and occupational therapy evaluations completed 04/20/2017 with recommendations of physical medicine rehabilitation consult patient was admitted for a comp rehabilitation program    Review of Systems  Constitutional: Negative  for chills and fever.  HENT: Negative for hearing loss.   Eyes: Negative for blurred vision and double vision.  Cardiovascular: Positive for leg swelling. Negative for chest pain.  Gastrointestinal: Positive for constipation. Negative for nausea and vomiting.       GERD  Genitourinary: Negative for dysuria and hematuria.  Musculoskeletal: Positive for joint pain.  Skin: Negative for rash.  Neurological: Positive for speech change and focal weakness.  Psychiatric/Behavioral: Positive for depression.  All other systems reviewed and are negative.  Past Medical History:  Diagnosis Date  . 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 TLovena Le . Aortic dilatation ([facility]    a. 04/2014 CTA chest w/ incidental finding of distal thoracic Ao enlargement of 3.39 mm - f/u needed 04/2015.  . Arthritis    "all over my body"  . [place] of [place] ([place]   s/p L mastectomy and chemo.  . [place]omy[place] ([facility]    a. 04/2014 Echo: EF 25-30%, mod conc LVH, possibl antsept HK, Gr1 DD, mod-sev dil LA.  . CHF (congestive heart failure) ([facility]   . CKD (chronic kidney disease), stage III ([facility] [place]   . Depression   . GERD (gastroesophageal reflux disease)   . Gout   . Heart murmur   . History of stomach ulcers   . Hypertension    a. Dx @ age 89  b. 04/2014 admission for HTN emergency.  . Obesity (BMI 30-39.9) 01/21/2015  . OSA on CPAP   . Stroke ([facility]    a. 3 strokes - last in 1998; "memory problems since"  (03/26/2015)   Past Surgical History:  Procedure Laterality Date  . ABDOMINAL HYSTERECTOMY    . BREAST BIOPSY Left 2008  .  CATARACT EXTRACTION, BILATERAL Bilateral   . DILATION AND CURETTAGE OF UTERUS    . EP IMPLANTABLE DEVICE N/A 03/26/2015   MDT single chamber ICD, Dr. Lovena Le  . LEFT HEART CATHETERIZATION WITH CORONARY ANGIOGRAM N/A 05/07/2014   Procedure: LEFT HEART CATHETERIZATION WITH CORONARY ANGIOGRAM;  Surgeon: Belva Crome, MD;  Location[facility] C[facility] LAB;   Service: Cardiovascular;  Laterality: N/A;  . MASTECTOMY Left 2009   Family History  Problem Relation Age of Onset  . High blood pressure Mother        Died @ 38.  . High blood pressure Father   . Heart attack Father        Died in his early 55's.  . High blood pressure Sister   . Cancer Sister   . Cancer Daughter   . Heart disease Daughter    Social History:  reports that she has quit smoking. Her smoking use included cigarettes. She has a 20.00 pack-year smoking history. she has never used smokeless tobacco. She reports that she does not drink alcohol or use drugs. Allergies:  Allergies  Allergen Reactions  . Shellfish Allergy Hives   Medications Prior to Admission  Medication Sig Dispense Refill  . allopurinol (ZYLOPRIM) 100 MG tablet Take 200 mg by mouth daily.   0  . aspirin EC 81 MG tablet Take 1 tablet (81 mg total) by mouth daily. 90 tablet 3  . atorvastatin (LIPITOR) 40 MG tablet Take 1 tablet (40 mg total) by mouth daily. 30 tablet 0  . carvedilol (COREG) 25 MG tablet Take 1 tablet (25 mg total) by mouth 2 (two) times daily. 60 tablet 0  . colchicine 0.6 MG tablet Take 0.6 mg by mouth daily.    . furosemide (LASIX) 40 MG tablet Take 1 tablet (40 mg total) by mouth daily. 90 tablet 3  . gabapentin (NEURONTIN) 300 MG capsule Take 300 mg by mouth 3 (three) times daily.    . isosorbide-hydrALAZINE (BIDIL) 20-37.5 MG tablet Take 2 tablets by mouth 2 (two) times daily. 120 tablet 11  . omeprazole (PRILOSEC) 40 MG capsule Take 40 mg by mouth daily.  0  . sacubitril-valsartan (ENTRESTO) 97-103 MG Take 1 tablet by mouth 2 (two) times daily. 60 tablet 3  . spironolactone (ALDACTONE) 25 MG tablet Take 1 tablet (25 mg total) by mouth daily. 30 tablet 3    Drug Regimen Review Drug regimen was reviewed and remains appropriate with no significant issues identified  Home: Home Living Family/patient expects to be discharged to:: Private residence Living Arrangements: Alone,  Children Available Help at Discharge: Family, Available 24 hours/day Type of Home: Apartment Home Access: Level entry Home Layout: One level Bathroom Shower/Tub: Multimedia programmer: Standard Home Equipment: Environmental consultant - 2 wheels, Cane - single point, Bedside commode   Functional History: Prior Function Level of Independence: Needs assistance Gait / Transfers Assistance Needed: Uses RW for ambulation; has needed more help recently due to weakness. ADL's / Homemaking Assistance Needed: Assist with bathing from daughter. Comments: Has been staying with daughter for the last week.  Functional Status:  Mobility: Bed Mobility Overal bed mobility: Needs Assistance Bed Mobility: Rolling, Sidelying to Sit Rolling: Min guard Sidelying to sit: Min assist, HOB elevated General bed mobility comments: Increased time to get to EOB with use of rail and assist to elevate trunk as pt not able to push through RUE.  Transfers Overall transfer level: Needs assistance Equipment used: Rolling walker (2 wheeled) Transfers: Sit to/from Stand, [employer]  Transfers Sit to Stand: Mod assist, +2 safety/equipment Stand pivot transfers: Mod assist, +2 physical assistance General transfer comment: Assist to power to standing wtih cues for hand placement, despite pulling up on RW. Stood from EOB x1, from [facility] x1, SPT bed to/from [employer] [facility] and bed to chair with mod A for balance, RW sequencing. Difficulty advancing LLE and WB through RLE. Increased knee flexion bilaterally.      ADL: ADL Overall ADL's : Needs assistance/impaired Eating/Feeding: Set up, Sitting Grooming: Set up, Sitting Upper Body Bathing: Set up, Sitting, Minimal assistance Lower Body Bathing: Maximal assistance, Sit to/from stand, +2 for safety/equipment Upper Body Dressing : Minimal assistance, Set up, Sitting Lower Body Dressing: Maximal assistance, +2 for safety/equipment, Sit to/from stand Toilet Transfer: Moderate assistance, +2 for  physical assistance, Stand-pivot, BSC, RW Toileting- Clothing Manipulation and Hygiene: Moderate assistance, +2 for safety/equipment, Sit to/from stand Tub/ Shower Transfer: Moderate assistance, Stand-pivot, +2 for physical assistance General ADL Comments: Pt completed bed mobility, SPT EOB>BSC>EOB>recliner.   Cognition: Cognition Overall Cognitive Status: No family/caregiver present to determine baseline cognitive functioning Orientation Level: Oriented to person, Oriented to place, Oriented to time Attention: Sustained Sustained Attention: Appears intact Cognition Arousal/Alertness: Awake/alert Behavior During Therapy: WFL for tasks assessed/performed Overall Cognitive Status: No family/caregiver present to determine baseline cognitive functioning General Comments: A&Ox4. Not formally assessed.  Physical Exam: Blood pressure (!) 104/58, pulse 81, temperature (!) 97.5 F (36.4 C), temperature source Oral, resp. rate 18, weight 99 kg (218 lb 4.1 oz), SpO2 100 %. Physical Exam Constitutional: No distress . Vital signs reviewed. obese HEENT: EOMI, oral membranes moist Cardiovascular: RRR without murmur. No JVD    Respiratory: CTA Bilaterally without wheezes or rales. Normal effort    GI: BS +, non-tender, non-distended  Skin. Warm and dry Neurological: She is alert and oriented to person, place, and time.  Speech dysarthric. Right central 7. Reasonable insight and awareness. Language normal. RUE grossly 4/5 prox to distal with +PD. RLE: 2-/5 prox to 1+ ADF/PF. LUE  grossly 4+ to 5/5. LLE 3+/5 prox to 4/5 distally.  No sensory findings. No resting tone.  Psych: pleasant and appropriate  Results for orders placed or performed during the hospital encounter of 04/19/17 (from the past 48 hour(s))  Basic metabolic panel     Status: Abnormal   Collection Time: 04/20/17  8:07 AM  Result Value Ref Range   Sodium 137 135 - 145 mmol/L   Potassium 5.1 3.5 - 5.1 mmol/L   Chloride 105 101 - 111  mmol/L   CO2 21 (L) 22 - 32 mmol/L   Glucose, Bld 103 (H) 65 - 99 mg/dL   BUN 40 (H) 6 - 20 mg/dL   Creatinine, Ser 1.97 (H) 0.44 - 1.00 mg/dL   Calcium 10.0 8.9 - 10.3 mg/dL   GFR calc non Af Amer 23 (L) >60 mL/min   GFR calc Af Amer 27 (L) >60 mL/min    Comment: (NOTE) The eGFR has been calculated using the CKD EPI equation. This calculation has not been validated in all clinical situations. eGFR's persistently <60 mL/min signify possible Chronic Kidney Disease.    Anion gap 11 5 - 15    Comment: Performed at [facility] [address], [place], [place] [address]  CBC with Differential/Platelet     Status: Abnormal   Collection Time: 04/21/17  5:07 AM  Result Value Ref Range   WBC 3.6 (L) 4.0 - 10.5 K/uL   RBC 4.28 3.87 - 5.11 MIL/uL  Hemoglobin 12.6 12.0 - 15.0 g/dL   HCT 39.4 36.0 - 46.0 %   MCV 92.1 78.0 - 100.0 fL   MCH 29.4 26.0 - 34.0 pg   MCHC 32.0 30.0 - 36.0 g/dL   RDW 16.8 (H) 11.5 - 15.5 %   Platelets 144 (L) 150 - 400 K/uL   Neutrophils Relative % 51 %   Neutro Abs 1.8 1.7 - 7.7 K/uL   Lymphocytes Relative 32 %   Lymphs Abs 1.2 0.7 - 4.0 K/uL   Monocytes Relative 12 %   Monocytes Absolute 0.5 0.1 - 1.0 K/uL   Eosinophils Relative 4 %   Eosinophils Absolute 0.2 0.0 - 0.7 K/uL   Basophils Relative 1 %   Basophils Absolute 0.0 0.0 - 0.1 K/uL    Comment: Performed at [facility] [address], [place], [place] [address]  Basic metabolic panel     Status: Abnormal   Collection Time: 04/21/17  5:07 AM  Result Value Ref Range   Sodium 135 135 - 145 mmol/L   Potassium 4.5 3.5 - 5.1 mmol/L   Chloride 101 101 - 111 mmol/L   CO2 23 22 - 32 mmol/L   Glucose, Bld 99 65 - 99 mg/dL   BUN 39 (H) 6 - 20 mg/dL   Creatinine, Ser 1.74 (H) 0.44 - 1.00 mg/dL   Calcium 10.2 8.9 - 10.3 mg/dL   GFR calc non Af Amer 27 (L) >60 mL/min   GFR calc Af Amer 31 (L) >60 mL/min    Comment: (NOTE) The eGFR has been calculated using the CKD EPI equation. This  calculation has not been validated in all clinical situations. eGFR's persistently <60 mL/min signify possible Chronic Kidney Disease.    Anion gap 11 5 - 15    Comment: Performed at [facility] [address] [place], [place] [address]  Magnesium     Status: None   Collection Time: 04/21/17  5:07 AM  Result Value Ref Range   Magnesium 1.7 1.7 - 2.4 mg/dL    Comment: Performed at [facility] [address], [place], [place] [address]   Mr Jodene Nam Head Wo Contrast  Result Date: 04/19/2017 CLINICAL DATA:  77 year old female with progressive right lower extremity weakness. Query recent stroke. EXAM: MRI HEAD WITHOUT CONTRAST MRA HEAD WITHOUT CONTRAST TECHNIQUE: Multiplanar, multiecho pulse sequences of the brain and surrounding structures were obtained without intravenous contrast. Angiographic images of the head were obtained using MRA technique without contrast. COMPARISON:  Head CT without contrast 04/18/2017. Lumbar spine MRI today reported separately. FINDINGS: MRI HEAD FINDINGS Brain: There is an oval 12 millimeter area of restricted diffusion involving the posterior lamina of the left internal capsule on series 3, image 25. there is associated T2 and FLAIR hyperintensity with mild T1 hypointensity. No associated hemorrhage or mass effect. No other diffusion restriction. Numerous chronic small or lacunar infarcts are scattered in both cerebellar hemispheres, the pons, the bilateral deep gray matter nuclei, and the cerebral white matter including the body of the corpus callosum. No cortical encephalomalacia is identified. There are occasional chronic micro hemorrhages, including in the right thalamus and cerebellum. No midline shift, mass effect, evidence of mass lesion, ventriculomegaly, extra-axial collection or acute intracranial hemorrhage. Cervicomedullary junction and pituitary are within normal limits. Vascular: Major intracranial vascular flow voids are preserved. See  intracranial MRA findings below. Skull and upper cervical spine: Negative visible cervical spine. Normal bone marrow signal. Hyperostosis of the calvarium, normal variant.  Sinuses/Orbits: Postoperative changes to the globes, otherwise normal orbits soft tissues. Paranasal sinuses and mastoids are stable and well pneumatized. Other: Visible internal auditory structures appear normal. Scalp and face soft tissues appear negative. MRA HEAD FINDINGS Tortuosity of the distal vertebral arteries likely in part responsible for apparent decreased flow signal in the proximal V4 segments. However, the left V4 segment may be stenotic. The right V4 is dominant with no stenosis suspected. Patent basilar artery with mild irregularity and stenosis. Dominant AICAs suspected. Normal SCA origins. Fetal type PCA origins. Mildly irregular bilateral PCA branches. On the source images distal PCA flow signal appears symmetric. Antegrade flow signal in both ICA siphons. No siphon stenosis identified, but there is short segment of fusiform aneurysmal enlargement of the left cavernous ICA up to 8 mm diameter (series 4, image 57). The bilateral ophthalmic and posterior communicating artery origins are normal. Patent carotid termini. Dominant left ACA A1 segment. The right is mildly irregular and stenotic. Anterior communicating artery and ACA A2 segments appear normal. MCA M1 segments and MCA bifurcations are patent with mild irregularity but no significant stenosis. Visible MCA branches are patent with mild irregularity. IMPRESSION: 1. Acute to early subacute lacunar infarct in the posterior limb of left internal capsule. No associated hemorrhage or mass effect. 2. Severe underlying chronic small vessel disease. Chronic lacunar infarcts in the bilateral deep gray matter nuclei, brainstem, cerebellum, and cerebral white matter. 3. 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 4. Intracranial atherosclerosis.  Hemodynamically significant stenosis is suspected only in the distal left vertebral artery. Electronically Signed   By: Genevie Ann M.D.   On: 04/19/2017 15:25   Mr Brain Wo Contrast  Result Date: 04/19/2017 CLINICAL DATA:  77 year old female with progressive right lower extremity weakness. Query recent stroke. EXAM: MRI HEAD WITHOUT CONTRAST MRA HEAD WITHOUT CONTRAST TECHNIQUE: Multiplanar, multiecho pulse sequences of the brain and surrounding structures were obtained without intravenous contrast. Angiographic images of the head were obtained using MRA technique without contrast. COMPARISON:  Head CT without contrast 04/18/2017. Lumbar spine MRI today reported separately. FINDINGS: MRI HEAD FINDINGS Brain: There is an oval 12 millimeter area of restricted diffusion involving the posterior lamina of the left internal capsule on series 3, image 25. there is associated T2 and FLAIR hyperintensity with mild T1 hypointensity. No associated hemorrhage or mass effect. No other diffusion restriction. Numerous chronic small or lacunar infarcts are scattered in both cerebellar hemispheres, the pons, the bilateral deep gray matter nuclei, and the cerebral white matter including the body of the corpus callosum. No cortical encephalomalacia is identified. There are occasional chronic micro hemorrhages, including in the right thalamus and cerebellum. No midline shift, mass effect, evidence of mass lesion, ventriculomegaly, extra-axial collection or acute intracranial hemorrhage. Cervicomedullary junction and pituitary are within normal limits. Vascular: Major intracranial vascular flow voids are preserved. See intracranial MRA findings below. Skull and upper cervical spine: Negative visible cervical spine. Normal bone marrow signal. Hyperostosis of the calvarium, normal variant. Sinuses/Orbits: Postoperative changes to the globes, otherwise normal orbits soft tissues. Paranasal sinuses and mastoids are stable and well  pneumatized. Other: Visible internal auditory structures appear normal. Scalp and face soft tissues appear negative. MRA HEAD FINDINGS Tortuosity of the distal vertebral arteries likely in part responsible for apparent decreased flow signal in the proximal V4 segments. However, the left V4 segment may be stenotic. The right V4 is dominant with no stenosis suspected. Patent basilar artery with mild irregularity and stenosis. Dominant  AICAs suspected. Normal SCA origins. Fetal type PCA origins. Mildly irregular bilateral PCA branches. On the source images distal PCA flow signal appears symmetric. Antegrade flow signal in both ICA siphons. No siphon stenosis identified, but there is short segment of fusiform aneurysmal enlargement of the left cavernous ICA up to 8 mm diameter (series 4, image 57). The bilateral ophthalmic and posterior communicating artery origins are normal. Patent carotid termini. Dominant left ACA A1 segment. The right is mildly irregular and stenotic. Anterior communicating artery and ACA A2 segments appear normal. MCA M1 segments and MCA bifurcations are patent with mild irregularity but no significant stenosis. Visible MCA branches are patent with mild irregularity. IMPRESSION: 1. Acute to early subacute lacunar infarct in the posterior limb of left internal capsule. No associated hemorrhage or mass effect. 2. Severe underlying chronic small vessel disease. Chronic lacunar infarcts in the bilateral deep gray matter nuclei, brainstem, cerebellum, and cerebral white matter. 3. 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 4. Intracranial atherosclerosis. Hemodynamically significant stenosis is suspected only in the distal left vertebral artery. Electronically Signed   By: Genevie Ann M.D.   On: 04/19/2017 15:25   Mr Lumbar Spine Wo Contrast  Result Date: 04/19/2017 CLINICAL DATA:  approximately 1 month ago daughter noticed her mother requires more assistance moving her  right leg while getting into the car. Last Thursday, daughter reports even worsening right leg weakness with patient being unable to get up from her bed without any support. EXAM: MRI LUMBAR SPINE WITHOUT CONTRAST TECHNIQUE: Multiplanar, multisequence MR imaging of the lumbar spine was performed. No intravenous contrast was administered. COMPARISON:  None. FINDINGS: Segmentation:  Standard. Alignment:  Physiologic. Vertebrae:  No fracture, evidence of discitis, or bone lesion. Conus medullaris and cauda equina: Conus extends to the L1 level. Conus and cauda equina appear normal. Paraspinal and other soft tissues: No acute paraspinal abnormality. Right renal cyst. Disc levels: Disc spaces: Disc desiccation at L3-4, L4-5 and L5-S1. T12-L1: No significant disc bulge. No evidence of neural foraminal stenosis. No central canal stenosis. L1-L2: No significant disc bulge. No evidence of neural foraminal stenosis. No central canal stenosis. L2-L3: No significant disc bulge. No evidence of neural foraminal stenosis. No central canal stenosis. L3-L4: Mild broad-based disc bulge eccentric towards the right. Moderate bilateral facet arthropathy. Mild spinal stenosis. No evidence of neural foraminal stenosis. L4-L5: Broad-based disc bulge with an annular fissure. Moderate bilateral facet arthropathy with ligamentum flavum infolding. Bilateral lateral recess stenosis. No evidence of neural foraminal stenosis. No central canal stenosis. L5-S1: Broad-based disc bulge. No evidence of neural foraminal stenosis. No central canal stenosis. IMPRESSION: 1. At L4-5 there is a broad-based disc bulge with an annular fissure. Moderate bilateral facet arthropathy with ligamentum flavum infolding. Bilateral lateral recess stenosis. 2. At L3-4 there is a mild broad-based disc bulge eccentric towards the right. Moderate bilateral facet arthropathy. Mild spinal stenosis. Electronically Signed   By: Kathreen Devoid   On: 04/19/2017 15:45   [place] Ekg  Site Rite  Result Date: 04/19/2017 If Site Rite image not attached, placement could not be confirmed due to current cardiac rhythm.      Medical Problem List and Plan: 1.  Right side weakness and dysarthria secondary to lacunar infarction posterior limb of left internal capsule secondary to small vessel disease. Plan aspirin and Plavix 3 weeks then Plavix alone   -admit to inpatient rehab 2.  DVT Prophylaxis/Anticoagulation: SCDs. Monitor for any signs of DVT 3. Pain Management:  Neurontin 300 mg 3 times a day, Tylenol as needed 4. Mood: Provide emotional support 5. Neuropsych: This patient is capable of making decisions on her own behalf. 6. Skin/Wound Care: Routine skin checks 7. Fluids/Electrolytes/Nutrition: Routine I&O's with follow-up chemistries 8. Chronic combined systolic and diastolic congestive heart failure. Lasix 40 mg daily held due to soft blood pressures and mild bump in creatinine. Resume as needed Monitor for any signs of fluid overload 9. Cardiomyopathy with AICD. Follow-up cardiology services   -follow daily weights 10. Hypertension. Coreg 25 mg twice a day,BIDIL 20-37.5 milligrams twice a day 11. Chronic right lower extremity edema.  Weight patient has directed. Resume Lasix 40 mg daily of blood pressure allows 12.CKD stage III. Baseline creatinine 1.69. 13. Hyperlipidemia. Lipitor 14. GERD. Protonix    Post Admission Physician Evaluation: 1. Functional deficits secondary  to left internal capsule infarct. 2. Patient is admitted to receive collaborative, interdisciplinary care between the physiatrist, rehab nursing staff, and therapy team. 3. Patient's level of medical complexity and substantial therapy needs in context of that medical necessity cannot be provided at a lesser intensity of care such as a SNF. 4. Patient has experienced substantial functional loss from his/her baseline which was documented above under the "Functional History" and "Functional Status"  headings.  Judging by the patient's diagnosis, physical exam, and functional history, the patient has potential for functional progress which will result in measurable gains while on inpatient rehab.  These gains will be of substantial and practical use upon discharge  in facilitating mobility and self-care at the household level. 5. Physiatrist will provide 24 hour management of medical needs as well as oversight of the therapy plan/treatment and provide guidance as appropriate regarding the interaction of the two. 6. The Preadmission Screening has been reviewed and patient status is unchanged unless otherwise stated above. 7. 24 hour rehab nursing will assist with bladder management, bowel management, safety, skin/wound care, disease management, medication administration, pain management and patient education  and help integrate therapy concepts, techniques,education, etc. 8. PT will assess and treat for/with: Lower extremity strength, range of motion, stamina, balance, functional mobility, safety, adaptive techniques and equipment, NMR, family education, .   Goals are: supervision to min assist. 9. OT will assess and treat for/with: ADL's, functional mobility, safety, upper extremity strength, adaptive techniques and equipment, NMR, family education.   Goals are: supervision to min assist. Therapy may proceed with showering this patient. 10. SLP will assess and treat for/with: speech, communication.  Goals are: mod I. 11. Case Management and Social Worker will assess and treat for psychological issues and discharge planning. 12. Team conference will be held weekly to assess progress toward goals and to determine barriers to discharge. 13. Patient will receive at least 3 hours of therapy per day at least 5 days per week. 14. ELOS: 14-17 days       15. Prognosis:  excellent     Meredith Staggers, MD, [facility] 04/22/2017  Lavon Paganini [place],  PA-C 04/21/2017

## 2017-04-21 NOTE — Progress Notes (Signed)
Patient ID: Tara Dunn, female   DOB: 1940/12/19, 77 y.o.   MRN: [id number]  PROGRESS NOTE    Tara Dunn  XTG:[id number] DOB: 03/26/1940 DOA: 04/19/2017 PCP: Fanny Bien, MD   Brief Narrative:  77 year old female with history of chronic combined systolic/diastolic heart failure, chronic kidney disease stage III, hypertension and history of CVA presented on 04/19/2017 with worsening right leg weakness.  Initial CT scan of the head was negative for acute abnormality.  Neurology was consulted.   Assessment & Plan:   Principal Problem:   Right leg weakness Active Problems:   Chronic combined systolic and diastolic CHF (congestive heart failure) (HCC)   CKD (chronic kidney disease), stage III (HCC)   Hx of stroke without residual deficits   Essential hypertension   OSA (obstructive sleep apnea)   ICD (implantable cardioverter-defibrillator) in place   Right sided weakness   Thalamic infarct, acute (HCC)   AKI (acute kidney injury) ([place])   Peripheral edema   Stage 3 chronic kidney disease (HCC)   Benign essential HTN   History of CVA with residual deficit   Cerebrovascular accident (CVA) due to thrombosis of left middle cerebral artery ([place])     1.  Acute to subacute CVA presenting with right-sided weakness in a patient with history of CVA  - MRI confirmed that she had acute to subacute CVA.  Neurology following. -Continue aspirin, Plavix and statin. -PT/OT eval.  Diet as per SLP recommendations  -Echo showed EF of 45-50% with grade 1 diastolic dysfunction  2. Chronic combined systolic & diastolic CHF  - Appears well-compensated  - Continue Lasix, Aldactone, Coreg, Bidil, Entresto  if blood pressure allows -Strict input and output.  Daily weights.  Outpatient follow-up with cardiology   3. Hypertension  - BP on the lower side.  Monitor.  Use antihypertensives with holding parameters, hold dose if blood pressure less than 100   4. CKD stage III  - Monitor.   Repeat a.m. creatinine   DVT prophylaxis: SCD's  Code Status: Full  Family Communication: Daughter updated at bedside  Disposition Plan: CIR once bed is available  Consultants: Neurology  Procedures: Echo  Study Conclusions  - Left ventricle: The cavity size was normal. Wall thickness was   increased in a pattern of moderate LVH. Systolic function was   mildly reduced. The estimated ejection fraction was in the range   of 45% to 50%. Diffuse hypokinesis. Doppler parameters are   consistent with abnormal left ventricular relaxation (grade 1   diastolic dysfunction). The E/e&' ratio is >15, suggesting   elevated LV filling pressure. - Mitral valve: Mildly thickened leaflets . There was trivial   regurgitation. - Left atrium: The atrium was normal in size. - Systemic veins: The IVC measures <2.1 cm, but collapses >50%,   suggesting an elevated RA pressure of 8 mmHg.  Impressions:  - Comapared to a prior study in 06/2016, the LVEF is higher at   45-50% with global hypokinesis, grade 1 DD and elevated LV   filling pressure. Antimicrobials: None   Subjective: Patient seen and examined at bedside.  No overnight fever, nausea or vomiting.  She states that her right lower extremity weakness is slightly improving   objective: Vitals:   04/21/17 0327 04/21/17 0755 04/21/17 1133 04/21/17 1204  BP:  (!) 104/58 94/68 (!) 94/56  Pulse: 77 81 66 64  Resp: 18 18  18   Temp: 97.7 F (36.5 C) (!) 97.5 F (36.4 C)  97.7 F (36.5 C)  TempSrc: Oral Oral  Oral  SpO2: 99% 100% 97% 98%  Weight: 99 kg (218 lb 4.1 oz)       Intake/Output Summary (Last 24 hours) at 04/21/2017 1436 Last data filed at 04/21/2017 0816 Gross per 24 hour  Intake 920 ml  Output 1350 ml  Net -430 ml   Filed Weights   04/20/17 0500 04/21/17 0327  Weight: 99.1 kg (218 lb 7.6 oz) 99 kg (218 lb 4.1 oz)    Examination:  General exam: Appears calm and comfortable  Respiratory system: Bilateral decreased  breath sounds at bases Cardiovascular system: S1 & S2 heard, RRR. No JVD, murmurs, rubs, gallops. Gastrointestinal system: Abdomen is nondistended, soft and nontender. Normal bowel sounds heard. Central nervous system: Alert and oriented. No focal neurological deficits. Moving extremities.  Still has right lower extremity weakness with slight improvement Extremities: No cyanosis, clubbing; 1+ edema     Data Reviewed: I have personally reviewed following labs and imaging studies  CBC: Recent Labs  Lab 04/14/17 1550 04/18/17 1755 04/18/17 1800 04/21/17 0507  WBC 3.9* 4.2  --  3.6*  NEUTROABS 2.2 2.4  --  1.8  HGB 11.0* 12.0 12.2 12.6  HCT 34.1* 37.4 36.0 39.4  MCV 91.4 91.4  --  92.1  PLT 135* PLATELETS APPEAR ADEQUATE  --  093*   Basic Metabolic Panel: Recent Labs  Lab 04/14/17 1550 04/18/17 1755 04/18/17 1800 04/20/17 0807 04/21/17 0507  NA 141 137 139 137 135  K 4.2 5.0 5.1 5.1 4.5  CL 109 105 104 105 101  CO2 22 21*  --  21* 23  GLUCOSE 92 92 95 103* 99  BUN 41* 36* 45* 40* 39*  CREATININE 1.64* 1.69* 1.70* 1.97* 1.74*  CALCIUM 9.4 10.1  --  10.0 10.2  MG  --   --   --   --  1.7   GFR: Estimated Creatinine Clearance: 30.3 mL/min (A) (by C-G formula based on SCr of 1.74 mg/dL (H)). Liver Function Tests: Recent Labs  Lab 04/14/17 1550 04/18/17 1755  AST 56* 46*  ALT 42 30  ALKPHOS 78 76  BILITOT 0.5 1.1  PROT 5.7* 6.5  ALBUMIN 3.7 3.8   No results for input(s): LIPASE, AMYLASE in the last 168 hours. No results for input(s): AMMONIA in the last 168 hours. Coagulation Profile: Recent Labs  Lab 04/18/17 1755  INR 1.02   Cardiac Enzymes: Recent Labs  Lab 04/19/17 0938  CKTOTAL 370*   BNP (last 3 results) No results for input(s): PROBNP in the last 8760 hours. HbA1C: Recent Labs    04/19/17 0424  HGBA1C 6.1*   CBG: Recent Labs  Lab 04/19/17 0222  GLUCAP 29   Lipid Profile: Recent Labs    04/19/17 0424  CHOL 102  HDL 48  LDLCALC 42    TRIG 59  CHOLHDL 2.1   Thyroid Function Tests: Recent Labs    04/19/17 0930  TSH 3.102   Anemia Panel: Recent Labs    04/19/17 [place]   Sepsis Labs: No results for input(s): PROCALCITON, LATICACIDVEN in the last 168 hours.  No results found for this or any previous visit (from the past 240 hour(s)).       Radiology Studies: Mr Virgel Paling OI Contrast  Result Date: 04/19/2017 CLINICAL DATA:  77 year old female with progressive right lower extremity weakness. Query recent stroke. EXAM: MRI HEAD WITHOUT CONTRAST MRA HEAD WITHOUT CONTRAST TECHNIQUE: Multiplanar, multiecho pulse sequences of the brain and surrounding structures were  obtained without intravenous contrast. Angiographic images of the head were obtained using MRA technique without contrast. COMPARISON:  Head CT without contrast 04/18/2017. Lumbar spine MRI today reported separately. FINDINGS: MRI HEAD FINDINGS Brain: There is an oval 12 millimeter area of restricted diffusion involving the posterior lamina of the left internal capsule on series 3, image 25. there is associated T2 and FLAIR hyperintensity with mild T1 hypointensity. No associated hemorrhage or mass effect. No other diffusion restriction. Numerous chronic small or lacunar infarcts are scattered in both cerebellar hemispheres, the pons, the bilateral deep gray matter nuclei, and the cerebral white matter including the body of the corpus callosum. No cortical encephalomalacia is identified. There are occasional chronic micro hemorrhages, including in the right thalamus and cerebellum. No midline shift, mass effect, evidence of mass lesion, ventriculomegaly, extra-axial collection or acute intracranial hemorrhage. Cervicomedullary junction and pituitary are within normal limits. Vascular: Major intracranial vascular flow voids are preserved. See intracranial MRA findings below. Skull and upper cervical spine: Negative visible cervical spine. Normal bone  marrow signal. Hyperostosis of the calvarium, normal variant. Sinuses/Orbits: Postoperative changes to the globes, otherwise normal orbits soft tissues. Paranasal sinuses and mastoids are stable and well pneumatized. Other: Visible internal auditory structures appear normal. Scalp and face soft tissues appear negative. MRA HEAD FINDINGS Tortuosity of the distal vertebral arteries likely in part responsible for apparent decreased flow signal in the proximal V4 segments. However, the left V4 segment may be stenotic. The right V4 is dominant with no stenosis suspected. Patent basilar artery with mild irregularity and stenosis. Dominant AICAs suspected. Normal SCA origins. Fetal type PCA origins. Mildly irregular bilateral PCA branches. On the source images distal PCA flow signal appears symmetric. Antegrade flow signal in both ICA siphons. No siphon stenosis identified, but there is short segment of fusiform aneurysmal enlargement of the left cavernous ICA up to 8 mm diameter (series 4, image 57). The bilateral ophthalmic and posterior communicating artery origins are normal. Patent carotid termini. Dominant left ACA A1 segment. The right is mildly irregular and stenotic. Anterior communicating artery and ACA A2 segments appear normal. MCA M1 segments and MCA bifurcations are patent with mild irregularity but no significant stenosis. Visible MCA branches are patent with mild irregularity. IMPRESSION: 1. Acute to early subacute lacunar infarct in the posterior limb of left internal capsule. No associated hemorrhage or mass effect. 2. Severe underlying chronic small vessel disease. Chronic lacunar infarcts in the bilateral deep gray matter nuclei, brainstem, cerebellum, and cerebral white matter. 3. 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 4. Intracranial atherosclerosis. Hemodynamically significant stenosis is suspected only in the distal left vertebral artery. Electronically Signed    By: Genevie Ann M.D.   On: 04/19/2017 15:25   Mr Brain Wo Contrast  Result Date: 04/19/2017 CLINICAL DATA:  77 year old female with progressive right lower extremity weakness. Query recent stroke. EXAM: MRI HEAD WITHOUT CONTRAST MRA HEAD WITHOUT CONTRAST TECHNIQUE: Multiplanar, multiecho pulse sequences of the brain and surrounding structures were obtained without intravenous contrast. Angiographic images of the head were obtained using MRA technique without contrast. COMPARISON:  Head CT without contrast 04/18/2017. Lumbar spine MRI today reported separately. FINDINGS: MRI HEAD FINDINGS Brain: There is an oval 12 millimeter area of restricted diffusion involving the posterior lamina of the left internal capsule on series 3, image 25. there is associated T2 and FLAIR hyperintensity with mild T1 hypointensity. No associated hemorrhage or mass effect. No other diffusion restriction. Numerous chronic small or  lacunar infarcts are scattered in both cerebellar hemispheres, the pons, the bilateral deep gray matter nuclei, and the cerebral white matter including the body of the corpus callosum. No cortical encephalomalacia is identified. There are occasional chronic micro hemorrhages, including in the right thalamus and cerebellum. No midline shift, mass effect, evidence of mass lesion, ventriculomegaly, extra-axial collection or acute intracranial hemorrhage. Cervicomedullary junction and pituitary are within normal limits. Vascular: Major intracranial vascular flow voids are preserved. See intracranial MRA findings below. Skull and upper cervical spine: Negative visible cervical spine. Normal bone marrow signal. Hyperostosis of the calvarium, normal variant. Sinuses/Orbits: Postoperative changes to the globes, otherwise normal orbits soft tissues. Paranasal sinuses and mastoids are stable and well pneumatized. Other: Visible internal auditory structures appear normal. Scalp and face soft tissues appear negative. MRA HEAD  FINDINGS Tortuosity of the distal vertebral arteries likely in part responsible for apparent decreased flow signal in the proximal V4 segments. However, the left V4 segment may be stenotic. The right V4 is dominant with no stenosis suspected. Patent basilar artery with mild irregularity and stenosis. Dominant AICAs suspected. Normal SCA origins. Fetal type PCA origins. Mildly irregular bilateral PCA branches. On the source images distal PCA flow signal appears symmetric. Antegrade flow signal in both ICA siphons. No siphon stenosis identified, but there is short segment of fusiform aneurysmal enlargement of the left cavernous ICA up to 8 mm diameter (series 4, image 57). The bilateral ophthalmic and posterior communicating artery origins are normal. Patent carotid termini. Dominant left ACA A1 segment. The right is mildly irregular and stenotic. Anterior communicating artery and ACA A2 segments appear normal. MCA M1 segments and MCA bifurcations are patent with mild irregularity but no significant stenosis. Visible MCA branches are patent with mild irregularity. IMPRESSION: 1. Acute to early subacute lacunar infarct in the posterior limb of left internal capsule. No associated hemorrhage or mass effect. 2. Severe underlying chronic small vessel disease. Chronic lacunar infarcts in the bilateral deep gray matter nuclei, brainstem, cerebellum, and cerebral white matter. 3. 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 4. Intracranial atherosclerosis. Hemodynamically significant stenosis is suspected only in the distal left vertebral artery. Electronically Signed   By: Genevie Ann M.D.   On: 04/19/2017 15:25   Mr Lumbar Spine Wo Contrast  Result Date: 04/19/2017 CLINICAL DATA:  approximately 1 month ago daughter noticed her mother requires more assistance moving her right leg while getting into the car. Last Thursday, daughter reports even worsening right leg weakness with patient being  unable to get up from her bed without any support. EXAM: MRI LUMBAR SPINE WITHOUT CONTRAST TECHNIQUE: Multiplanar, multisequence MR imaging of the lumbar spine was performed. No intravenous contrast was administered. COMPARISON:  None. FINDINGS: Segmentation:  Standard. Alignment:  Physiologic. Vertebrae:  No fracture, evidence of discitis, or bone lesion. Conus medullaris and cauda equina: Conus extends to the L1 level. Conus and cauda equina appear normal. Paraspinal and other soft tissues: No acute paraspinal abnormality. Right renal cyst. Disc levels: Disc spaces: Disc desiccation at L3-4, L4-5 and L5-S1. T12-L1: No significant disc bulge. No evidence of neural foraminal stenosis. No central canal stenosis. L1-L2: No significant disc bulge. No evidence of neural foraminal stenosis. No central canal stenosis. L2-L3: No significant disc bulge. No evidence of neural foraminal stenosis. No central canal stenosis. L3-L4: Mild broad-based disc bulge eccentric towards the right. Moderate bilateral facet arthropathy. Mild spinal stenosis. No evidence of neural foraminal stenosis. L4-L5: Broad-based disc bulge with an  annular fissure. Moderate bilateral facet arthropathy with ligamentum flavum infolding. Bilateral lateral recess stenosis. No evidence of neural foraminal stenosis. No central canal stenosis. L5-S1: Broad-based disc bulge. No evidence of neural foraminal stenosis. No central canal stenosis. IMPRESSION: 1. At L4-5 there is a broad-based disc bulge with an annular fissure. Moderate bilateral facet arthropathy with ligamentum flavum infolding. Bilateral lateral recess stenosis. 2. At L3-4 there is a mild broad-based disc bulge eccentric towards the right. Moderate bilateral facet arthropathy. Mild spinal stenosis. Electronically Signed   By: Kathreen Devoid   On: 04/19/2017 15:45   [place] Ekg Site Rite  Result Date: 04/19/2017 If Site Rite image not attached, placement could not be confirmed due to current  cardiac rhythm.       Scheduled Meds: .  stroke: mapping our early stages of recovery book   Does not apply Once  . allopurinol  200 mg Oral Daily  . aspirin EC  81 mg Oral Daily  . atorvastatin  80 mg Oral q1800  . carvedilol  25 mg Oral BID WC  . clopidogrel  75 mg Oral Daily  . furosemide  40 mg Oral Daily  . gabapentin  300 mg Oral TID  . isosorbide-hydrALAZINE  2 tablet Oral BID  . pantoprazole  40 mg Oral Daily  . sacubitril-valsartan  1 tablet Oral BID  . spironolactone  25 mg Oral Daily   Continuous Infusions:   LOS: 1 day        Aline August, MD [facility] Pager [phone]  If 7PM-7AM, please contact night-coverage [email or web address] Password [facility] 04/21/2017, 2:36 PM

## 2017-04-21 NOTE — Progress Notes (Addendum)
CSW spoke with patient's daughter, [name], over the phone to discuss SNF options. CSW explained the differences between CIR and SNF level rehabilitation, and how CIR was much more intensive than the level of therapies received at SNF. Patient's daughter was confused, and didn't understand why the patient wouldn't be able to stay for CIR because that was what would be best for her and what she wanted. CSW explained that the patient would need 24 hour after discharge from CIR, and that per Melissa (rehab admissions), the daughter said that wouldn't be possible. Daughter said she definitely wanted the patient to stay at the hospital for rehabilitation, and she would "get it straight" to make sure the patient could return home with the daughter at discharge. Daughter said, "Oh, I want her to stay there, that would be the best for her in the long run; I'll get it straight, she's coming home with me, don't worry about that. You just keep her there, please, she needs that."  CSW alerted Rehab Admissions Coordinator. CSW will follow for SNF, if needed; but patient and family hopeful for CIR admission.  Laveda Abbe, [place] Clinical Social Worker [phone]

## 2017-04-21 NOTE — Plan of Care (Signed)
  Clinical Measurements: Will remain free from infection 04/21/2017 0254 - Progressing by Carin Hock I, RN   Clinical Measurements: Ability to maintain clinical measurements within normal limits will improve 04/21/2017 0254 - Progressing by Carin Hock I, RN   Clinical Measurements: Respiratory complications will improve 04/21/2017 0254 - Progressing by Marcos Eke, RN

## 2017-04-22 ENCOUNTER — Inpatient Hospital Stay (HOSPITAL_COMMUNITY)
Admission: RE | Admit: 2017-04-22 | Discharge: 2017-05-11 | DRG: 057 | Disposition: A | Discharge status: Home Health | Payer: PPO | Source: Intra-hospital | Attending: Physical Medicine & Rehabilitation | Admitting: Physical Medicine & Rehabilitation

## 2017-04-22 ENCOUNTER — Other Ambulatory Visit: Payer: Self-pay

## 2017-04-22 ENCOUNTER — Encounter (HOSPITAL_COMMUNITY): Payer: Self-pay

## 2017-04-22 DIAGNOSIS — Z90710 Acquired absence of both cervix and uterus: Secondary | ICD-10-CM

## 2017-04-22 DIAGNOSIS — I69351 Hemiplegia and hemiparesis following cerebral infarction affecting right dominant side: Principal | ICD-10-CM

## 2017-04-22 DIAGNOSIS — N183 Chronic kidney disease, stage 3 unspecified: Secondary | ICD-10-CM | POA: Diagnosis present

## 2017-04-22 DIAGNOSIS — R471 Dysarthria and anarthria: Secondary | ICD-10-CM | POA: Diagnosis present

## 2017-04-22 DIAGNOSIS — Z87891 Personal history of nicotine dependence: Secondary | ICD-10-CM

## 2017-04-22 DIAGNOSIS — Z853 Personal history of malignant neoplasm of breast: Secondary | ICD-10-CM | POA: Diagnosis not present

## 2017-04-22 DIAGNOSIS — Z95810 Presence of automatic (implantable) cardiac defibrillator: Secondary | ICD-10-CM

## 2017-04-22 DIAGNOSIS — E441 Mild protein-calorie malnutrition: Secondary | ICD-10-CM | POA: Diagnosis not present

## 2017-04-22 DIAGNOSIS — E46 Unspecified protein-calorie malnutrition: Secondary | ICD-10-CM

## 2017-04-22 DIAGNOSIS — D62 Acute posthemorrhagic anemia: Secondary | ICD-10-CM | POA: Diagnosis not present

## 2017-04-22 DIAGNOSIS — I5042 Chronic combined systolic (congestive) and diastolic (congestive) heart failure: Secondary | ICD-10-CM | POA: Diagnosis present

## 2017-04-22 DIAGNOSIS — Z79899 Other long term (current) drug therapy: Secondary | ICD-10-CM | POA: Diagnosis not present

## 2017-04-22 DIAGNOSIS — E8809 Other disorders of plasma-protein metabolism, not elsewhere classified: Secondary | ICD-10-CM

## 2017-04-22 DIAGNOSIS — Z8711 Personal history of peptic ulcer disease: Secondary | ICD-10-CM | POA: Diagnosis not present

## 2017-04-22 DIAGNOSIS — Z6839 Body mass index (BMI) 39.0-39.9, adult: Secondary | ICD-10-CM

## 2017-04-22 DIAGNOSIS — Z91013 Allergy to seafood: Secondary | ICD-10-CM | POA: Diagnosis not present

## 2017-04-22 DIAGNOSIS — M1991 Primary osteoarthritis, unspecified site: Secondary | ICD-10-CM | POA: Diagnosis not present

## 2017-04-22 DIAGNOSIS — I130 Hypertensive heart and chronic kidney disease with heart failure and stage 1 through stage 4 chronic kidney disease, or unspecified chronic kidney disease: Secondary | ICD-10-CM | POA: Diagnosis not present

## 2017-04-22 DIAGNOSIS — I10 Essential (primary) hypertension: Secondary | ICD-10-CM | POA: Diagnosis not present

## 2017-04-22 DIAGNOSIS — R0989 Other specified symptoms and signs involving the circulatory and respiratory systems: Secondary | ICD-10-CM

## 2017-04-22 DIAGNOSIS — G4733 Obstructive sleep apnea (adult) (pediatric): Secondary | ICD-10-CM | POA: Diagnosis present

## 2017-04-22 DIAGNOSIS — I63312 Cerebral infarction due to thrombosis of left middle cerebral artery: Secondary | ICD-10-CM

## 2017-04-22 DIAGNOSIS — G8191 Hemiplegia, unspecified affecting right dominant side: Secondary | ICD-10-CM | POA: Diagnosis not present

## 2017-04-22 DIAGNOSIS — I429 Cardiomyopathy, unspecified: Secondary | ICD-10-CM | POA: Diagnosis not present

## 2017-04-22 DIAGNOSIS — E785 Hyperlipidemia, unspecified: Secondary | ICD-10-CM | POA: Diagnosis present

## 2017-04-22 DIAGNOSIS — K219 Gastro-esophageal reflux disease without esophagitis: Secondary | ICD-10-CM | POA: Diagnosis present

## 2017-04-22 DIAGNOSIS — Z9012 Acquired absence of left breast and nipple: Secondary | ICD-10-CM | POA: Diagnosis not present

## 2017-04-22 DIAGNOSIS — Z7982 Long term (current) use of aspirin: Secondary | ICD-10-CM

## 2017-04-22 LAB — BASIC METABOLIC PANEL
Anion gap: 9 (ref 5–15)
BUN: 43 mg/dL — ABNORMAL HIGH (ref 6–20)
CHLORIDE: 103 mmol/L (ref 101–111)
CO2: 24 mmol/L (ref 22–32)
Calcium: 10 mg/dL (ref 8.9–10.3)
Creatinine, Ser: 2.2 mg/dL — ABNORMAL HIGH (ref 0.44–1.00)
GFR calc non Af Amer: 20 mL/min — ABNORMAL LOW (ref >60)
GFR, EST AFRICAN AMERICAN: 24 mL/min — AB (ref >60)
Glucose, Bld: 116 mg/dL — ABNORMAL HIGH (ref 65–99)
POTASSIUM: 4.8 mmol/L (ref 3.5–5.1)
SODIUM: 136 mmol/L (ref 135–145)

## 2017-04-22 LAB — MAGNESIUM: MAGNESIUM: 1.6 mg/dL — AB (ref 1.7–2.4)

## 2017-04-22 MED ORDER — ONDANSETRON HCL 4 MG PO TABS: 4.0000 mg | ORAL_TABLET | Freq: Four times a day (QID) | ORAL | Status: DC | PRN

## 2017-04-22 MED ORDER — PANTOPRAZOLE SODIUM 40 MG PO TBEC
40.0000 mg | DELAYED_RELEASE_TABLET | Freq: Every day | ORAL | Status: DC
  Administered 2017-04-23 – 2017-05-11 (×19): 40 mg via ORAL
  Filled 2017-04-22 (×19): qty 1

## 2017-04-22 MED ORDER — ACETAMINOPHEN 650 MG RE SUPP: 650.0000 mg | RECTAL | Status: DC | PRN

## 2017-04-22 MED ORDER — SORBITOL 70 % SOLN
30.0000 mL | Freq: Every day | Status: DC | PRN
  Administered 2017-04-22 – 2017-05-10 (×3): 30 mL via ORAL
  Filled 2017-04-22 (×3): qty 30

## 2017-04-22 MED ORDER — ISOSORB DINITRATE-HYDRALAZINE 20-37.5 MG PO TABS
2.0000 | ORAL_TABLET | Freq: Two times a day (BID) | ORAL | Status: DC
  Administered 2017-04-22 – 2017-05-11 (×38): 2 via ORAL
  Filled 2017-04-22 (×41): qty 2

## 2017-04-22 MED ORDER — CARVEDILOL 25 MG PO TABS
25.0000 mg | ORAL_TABLET | Freq: Two times a day (BID) | ORAL | Status: DC
  Administered 2017-04-22 – 2017-05-11 (×38): 25 mg via ORAL
  Filled 2017-04-22 (×39): qty 1

## 2017-04-22 MED ORDER — CLOPIDOGREL BISULFATE 75 MG PO TABS: 75.0000 mg | ORAL_TABLET | Freq: Every day | ORAL | Status: DC

## 2017-04-22 MED ORDER — ACETAMINOPHEN 325 MG PO TABS
650.0000 mg | ORAL_TABLET | ORAL | Status: DC | PRN
  Administered 2017-04-23 – 2017-05-10 (×14): 650 mg via ORAL
  Filled 2017-04-22 (×14): qty 2

## 2017-04-22 MED ORDER — ONDANSETRON HCL 4 MG/2ML IJ SOLN: 4.0000 mg | Freq: Four times a day (QID) | INTRAMUSCULAR | Status: DC | PRN

## 2017-04-22 MED ORDER — GABAPENTIN 300 MG PO CAPS
300.0000 mg | ORAL_CAPSULE | Freq: Three times a day (TID) | ORAL | Status: DC
  Administered 2017-04-22 – 2017-05-11 (×56): 300 mg via ORAL
  Filled 2017-04-22 (×56): qty 1

## 2017-04-22 MED ORDER — SENNOSIDES-DOCUSATE SODIUM 8.6-50 MG PO TABS: 1.0000 | ORAL_TABLET | Freq: Every evening | ORAL | Status: DC | PRN

## 2017-04-22 MED ORDER — ACETAMINOPHEN 160 MG/5ML PO SOLN: 650.0000 mg | ORAL | Status: DC | PRN

## 2017-04-22 MED ORDER — MAGNESIUM SULFATE 2 GM/50ML IV SOLN
2.0000 g | Freq: Once | INTRAVENOUS | Status: AC
  Administered 2017-04-22: 2 g via INTRAVENOUS
  Filled 2017-04-22: qty 50

## 2017-04-22 MED ORDER — ASPIRIN EC 81 MG PO TBEC
81.0000 mg | DELAYED_RELEASE_TABLET | Freq: Every day | ORAL | Status: DC
  Administered 2017-04-23 – 2017-05-09 (×17): 81 mg via ORAL
  Filled 2017-04-22 (×17): qty 1

## 2017-04-22 MED ORDER — ALLOPURINOL 100 MG PO TABS
200.0000 mg | ORAL_TABLET | Freq: Every day | ORAL | Status: DC
  Administered 2017-04-23 – 2017-05-11 (×19): 200 mg via ORAL
  Filled 2017-04-22 (×19): qty 2

## 2017-04-22 MED ORDER — CLOPIDOGREL BISULFATE 75 MG PO TABS
75.0000 mg | ORAL_TABLET | Freq: Every day | ORAL | Status: DC
  Administered 2017-04-23 – 2017-05-11 (×19): 75 mg via ORAL
  Filled 2017-04-22 (×19): qty 1

## 2017-04-22 MED ORDER — ATORVASTATIN CALCIUM 80 MG PO TABS
80.0000 mg | ORAL_TABLET | Freq: Every day | ORAL | Status: DC
  Administered 2017-04-22 – 2017-05-10 (×19): 80 mg via ORAL
  Filled 2017-04-22 (×19): qty 1

## 2017-04-22 MED ORDER — ATORVASTATIN CALCIUM 80 MG PO TABS: 80.0000 mg | ORAL_TABLET | Freq: Every day | ORAL | Status: DC

## 2017-04-22 NOTE — Progress Notes (Addendum)
Pt transferred to unit, A&O x 4, verbalizes no c/o pain. No skin issues noted. Resting in bed with call bell in reach. Side rails up x 3. Son at beside.

## 2017-04-22 NOTE — Progress Notes (Signed)
PMR Admission Coordinator Pre-Admission Assessment  Patient: Tara Dunn is an 77 y.o., female MRN: [id number] DOB: Jun 17, 1940 Height:  5'3" Weight: 99 kg (218 lb 4.1 oz)                                                                                                                                                  Insurance Information HMO:     PPO: X     PCP:      IPA:      80/20:      OTHER:  PRIMARY: [employer]       Policy#: [id number]      Subscriber: Self CM Name: Helene Kelp      Phone#: [phone]     Fax#: [phone] Pre-Cert#: [id number] for 10 days      Employer: Retired  Benefits:  Phone #: [phone]     Name: Verified online HTA portal  Eff. Date: 02/08/17     Deduct: $0      Out of Pocket Max: $3400      Life Max: N/A CIR: $295 a day, days 1-6; $0 a day, days 7-90      SNF: $20 a day, days 1-20; $160 a day, days 21-100 Outpatient: Necessity      Co-Pay: $15 per visit  Home Health: Necessity, 100%      Co-Pay: $0 DME: 80%     Co-Pay: 20% Providers: In-network   SECONDARY: None        Medicaid Application Date:       Case Manager:  Disability Application Date:       Case Worker:   Emergency Contact Information        Contact Information    Name Relation Home Work [place] Daughter [phone]  [phone]     Current Medical History  Patient Admitting Diagnosis: 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 Demetria Pore of chronic combined systolic and diastolic congestive heart failure, chronic kidney disease stage III, hypertension and CVA3 with minimal left side weakness andmaintained on aspirin 81 mg daily. Per chart review and patient,patient lives alone however she has been staying  with her daughter for the past couple weeks she was needing more assistance with mobility using a rolling walker for ambulation.She states she needed assistance with all ADLs just prior to her admission.  One level home. Presented 04/19/2017 with right-sided weakness as well as slurred speech. Creatinine 1.69, EKG sinus rhythm with first degree AV nodal block. Troponin negative.CT unremarkable for acute process.MRI as well as MRA showed acute early subacute lacunar infarct posterior limb of left internal capsule. No associated hemorrhage or mass effect. 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 MRI lumbar spine showed L3-4 and L4-5 broad-based disc bulge with annular fissure. Mild spinal stenosis. Patient did not receive TPA.AICD was interrogated with no findings.Echocardiogram with ejection fraction of 69% grade 1 diastolic dysfunction. Neurology consulted presently on aspirin dailyand Plavixfor CVA prophylaxis3 weeks then Plavix alone. Tolerating a regular diet. Physicalandoccupational therapy evaluations completed 04/20/2017 with recommendations of physical medicine rehabilitation consultpatient was admitted for a comprehensive rehabilitation program 04/22/17.   NIHTotal: 5  Past Medical History      Past Medical History:  Diagnosis Date  . 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 Lovena Le  . Aortic dilatation ([place])    a. 04/2014 CTA chest w/ incidental finding of distal thoracic Ao enlargement of 3.39 mm - f/u needed 04/2015.  . Arthritis    "all over my body"  . Cancer of left breast ([place]) 2009   s/p L mastectomy and chemo.  . Cardiomyopathy ([place])    a. 04/2014 Echo: EF 25-30%, mod conc LVH, possibl antsept HK, Gr1 DD, mod-sev dil LA.  . CHF (congestive heart failure) ([place])   . CKD (chronic kidney disease), stage III ([place])   . Depression   . GERD (gastroesophageal reflux disease)   . Gout   . Heart murmur     . History of stomach ulcers   . Hypertension    a. Dx @ age 54;  b. 04/2014 admission for HTN emergency.  . Obesity (BMI 30-39.9) 01/21/2015  . OSA on CPAP   . Stroke [facility])    a. 3 strokes - last in 1998; "memory problems since"  (03/26/2015)    Family History  family history includes Cancer in her daughter and sister; Heart attack in her father; Heart disease in her daughter; High blood pressure in her father, mother, and sister.  Prior Rehab/Hospitalizations:  Has the patient had major surgery during 100 days prior to admission? No  Current Medications   Current Facility-Administered Medications:  .   stroke: mapping our early stages of recovery book, , Does not apply, Once, Opyd, Ilene Qua, MD .  acetaminophen (TYLENOL) tablet 650 mg, 650 mg, Oral, Q4H PRN **OR** acetaminophen (TYLENOL) solution 650 mg, 650 mg, Per Tube, Q4H PRN **OR** acetaminophen (TYLENOL) suppository 650 mg, 650 mg, Rectal, Q4H PRN, Opyd, Timothy S, MD .  allopurinol (ZYLOPRIM) tablet 200 mg, 200 mg, Oral, Daily, Opyd, Ilene Qua, MD, 200 mg at 04/22/17 0923 .  aspirin EC tablet 81 mg, 81 mg, Oral, Daily, Rosalin Hawking, MD, 81 mg at 04/22/17 6295 .  atorvastatin (LIPITOR) tablet 80 mg, 80 mg, Oral, q1800, Oretha Milch D, MD, 80 mg at 04/21/17 1721 .  carvedilol (COREG) tablet 25 mg, 25 mg, Oral, BID WC, Alekh, Kshitiz, MD, 25 mg at 04/22/17 0744 .  clopidogrel (PLAVIX) tablet 75 mg, 75 mg, Oral, Daily, Rosalin Hawking, MD, 75 mg at 04/22/17 2841 .  gabapentin (NEURONTIN) capsule 300 mg, 300 mg, Oral, TID, Opyd, Ilene Qua, MD, 300 mg at 04/22/17 0923 .  isosorbide-hydrALAZINE (BIDIL) 20-37.5 MG per tablet 2 tablet, 2 tablet, Oral, BID, Opyd, Ilene Qua, MD, 2 tablet at 04/21/17 2221 .  pantoprazole (PROTONIX) EC tablet 40 mg, 40 mg, Oral, Daily, Opyd, Ilene Qua, MD, 40 mg at 04/22/17 0923 .  senna-docusate (Senokot-S) tablet 1 tablet, 1 tablet, Oral, QHS PRN, Opyd, Ilene Qua, MD  Patients Current Diet: Fall  precautions Diet Heart Room service appropriate? Yes; Fluid consistency: Thin  Precautions / Restrictions Precautions Precautions: Fall  Restrictions Weight Bearing Restrictions: No   Has the patient had 2 or more falls or a fall with injury in the past year?Yes  Prior Activity Level Limited Community (1-2x/wk): Prior to 2 weeks ago patient lived alone in an [place] on [address], and was independent with self-care and home management tasks except for loading her medication box.  She has a home health RN, who loaded her medication box for her on Thursdays. Her daughter drove her to run errands as needed.     Home Assistive Devices / Equipment Home Assistive Devices/Equipment: Nebulizer, Eyeglasses, Dentures (specify type), Walker (specify type), Cane (specify quad or straight), Grab bars in shower, Shower chair without back, Other (Comment)("pullups") Home Equipment: Walker - 2 wheels, Cane - single point, Bedside commode  Prior Device Use: Indicate devices/aids used by the patient prior to current illness, exacerbation or injury? Walker  Prior Functional Level Prior Function Level of Independence: Needs assistance Gait / Transfers Assistance Needed: Uses RW for ambulation; has needed more help recently due to weakness. ADL's / Homemaking Assistance Needed: Assist with bathing from daughter. Comments: Has been staying with daughter for the last week.  Self Care: Did the patient need help bathing, dressing, using the toilet or eating?Independent  Indoor Mobility: Did the patient need assistance with walking from room to room (with or without device)? Independent  Stairs: Did the patient need assistance with internal or external stairs (with or without device)? Dependent  Functional Cognition: Did the patient need help planning regular tasks such as shopping or remembering to take medications? Needed some help  Current Functional Level Cognition   Overall Cognitive Status: No family/caregiver present to determine baseline cognitive functioning Orientation Level: Oriented to person, Oriented to place, Oriented to time General Comments: A&Ox4. Not formally assessed. Attention: Sustained Sustained Attention: Appears intact    Extremity Assessment (includes Sensation/Coordination)  Upper Extremity Assessment: Generalized weakness, RUE deficits/detail, LUE deficits/detail RUE Deficits / Details: mild FMC deficits, pt reports baseline LUE Deficits / Details: mild FMC deficits, pt reports baseline  Lower Extremity Assessment: Defer to PT evaluation RLE Deficits / Details: Grossly ~3/5 knee extension, 2+/5 knee flexion, 1/5 hip flexion. RLE Sensation: decreased light touch RLE Coordination: decreased fine motor, decreased gross motor LLE Deficits / Details: Grossly ~3/5 throughout. LLE Sensation: decreased light touch    ADLs  Overall ADL's : Needs assistance/impaired Eating/Feeding: Set up, Sitting Grooming: Set up, Sitting Upper Body Bathing: Set up, Sitting, Minimal assistance Lower Body Bathing: Maximal assistance, Sit to/from stand, +2 for safety/equipment Upper Body Dressing : Minimal assistance, Set up, Sitting Lower Body Dressing: Maximal assistance, +2 for safety/equipment, Sit to/from stand Toilet Transfer: Moderate assistance, +2 for physical assistance, Stand-pivot, BSC, RW Toileting- Clothing Manipulation and Hygiene: Moderate assistance, +2 for safety/equipment, Sit to/from stand Tub/ Shower Transfer: Moderate assistance, Stand-pivot, +2 for physical assistance General ADL Comments: Pt completed bed mobility, SPT EOB>BSC>EOB>recliner.     Mobility  Overal bed mobility: Needs Assistance Bed Mobility: Rolling, Sidelying to Sit Rolling: Min guard Sidelying to sit: Min assist, HOB elevated General bed mobility comments: Increased time to get to EOB with use of rail and assist to elevate trunk as pt not able to push  through RUE.     Transfers  Overall transfer level: Needs assistance Equipment used: Rolling walker (2 wheeled) Transfers: Sit to/from Stand, [employer] Transfers Sit to Stand: Mod assist, +2 safety/equipment Stand pivot transfers: Mod assist, +2 physical assistance General transfer comment: Assist to power to standing  wtih cues for hand placement, despite pulling up on RW. Stood from EOB x1, from [facility] x1, SPT bed to/from [facility] and bed to chair with mod A for balance, RW sequencing. Difficulty advancing LLE and WB through RLE. Increased knee flexion bilaterally.    Ambulation / Gait / Stairs / Office manager / Balance Balance Overall balance assessment: Needs assistance, History of Falls Sitting-balance support: Feet supported, No upper extremity supported Sitting balance-Leahy Scale: Fair Standing balance support: During functional activity, Single extremity supported Standing balance-Leahy Scale: Poor Standing balance comment: Requires Mod A for dynamic standing balance for pericare due to posterior lean and bil knee weakness.     Special needs/care consideration BiPAP/CPAP: Yes CPAP at night  CPM: No Continuous Drip IV: No Dialysis: No         Life Vest: No Oxygen: No Special Bed: No Trach Size: No Wound Vac (area): No       Skin: Bruising to bilateral arms                                Bowel mgmt: Continent, last BM 04/20/17  Bladder mgmt: Incontinent intermittently with external foley  Diabetic mgmt: No     Previous Home Environment Living Arrangements: Alone  Lives With: Alone Available Help at Discharge: Family, Available 24 hours/day Type of Home: Apartment Home Layout: One level Home Access: Level entry(3rd floor apartment accessed via elevator ) Bathroom Shower/Tub: Multimedia programmer: Standard Bathroom Accessibility: (TBD) Home Care Services: Yes Type of Home Care Services: Home RN(loaded med box on Thursdays) [place] (if known): Daughter reports that they were recently established with [place]  Discharge Living Setting Plans for Discharge Living Setting: Other (Comment)(Going to stay with daughter until she can return to her apt.) Type of Home at Discharge: House Discharge Home Layout: One level Discharge Home Access: Other (comment)(side entry, threshold ) Discharge Bathroom Shower/Tub: Walk-in shower Discharge Bathroom Toilet: Standard Discharge Bathroom Accessibility: Yes How Accessible: Accessible via walker Does the patient have any problems obtaining your medications?: No  Social/Family/Support Systems Patient Roles: Parent, Other (Comment)(Grandparent ) Contact Information: Daughter: [name]  Anticipated Caregiver: [name] Anticipated Caregiver's Contact Information: Cell:[phone] Ability/Limitations of Caregiver: None Caregiver Availability: 24/7 Discharge Plan Discussed with Primary Caregiver: Yes Is Caregiver In Agreement with Plan?: Yes Does Caregiver/Family have Issues with Lodging/Transportation while Pt is in Rehab?: No  Goals/Additional Needs Patient/Family Goal for Rehab: PT/OT: Supervision-Min A; SLP: Supervision  Expected length of stay: 14-17 days  Cultural Considerations: None Dietary Needs: Heart Healthy diet restrictions  Equipment Needs: TBD Pt/Family Agrees to Admission and willing to participate: Yes Program Orientation Provided & Reviewed with Pt/Caregiver Including Roles  & Responsibilities: Yes  Decrease burden of Care through IP rehab admission: No  Possible need for SNF placement upon discharge: No  Patient Condition: This patient's medical and functional status has changed since the consult dated 04/20/17 in which the Rehabilitation Physician determined and documented that the patient was potentially appropriate for intensive rehabilitative care in an inpatient rehabilitation facility. Issues have been addressed and update  has been discussed with Dr. Naaman Plummer and patient now appropriate for inpatient rehabilitation. Will admit to inpatient rehab today.   Preadmission Screen Completed By:  Gunnar Fusi, 04/22/2017 2:46 PM ______________________________________________________________________   Discussed status with Dr. Naaman Plummer on 04/22/17 at 1450 and received telephone approval for admission today.  Admission  Coordinator:  Gunnar Fusi, time 1450/Date 04/22/17             Cosigned by: Meredith Staggers, MD at 04/22/2017 3:04 PM  Revision History

## 2017-04-22 NOTE — Discharge Summary (Signed)
Physician Discharge Summary  Tara Dunn WCH:[id number] DOB: Mar 10, 1940 DOA: 04/19/2017  PCP: Fanny Bien, MD  Admit date: 04/19/2017 Discharge date: 04/22/2017  Admitted From: Home Disposition:  CIR  Recommendations for Outpatient Follow-up:  1. Follow up with provider at CIR at earliest convenience with repeat BMP in the next few days 2. Follow-up with neurology as an outpatient 3. Follow-up with cardiology as an outpatient   Home Health: No Equipment/Devices: None  Discharge Condition: Stable CODE STATUS: Full Diet recommendation: Heart Healthy /diet as per SLP recommendations  Brief/Interim Summary: 77 year old female with history of chronic combined systolic/diastolic heart failure, chronic kidney disease stage III, hypertension and history of CVA presented on 04/19/2017 with worsening right leg weakness.  Initial CT scan of the head was negative for acute abnormality.  Neurology was consulted.  MRI confirmed that she had acute to subacute CVA.  Patient is tolerating diet as per SLP recommendations.  She will be discharged to CIR.    Discharge Diagnoses:  Principal Problem:   Right leg weakness Active Problems:   Chronic combined systolic and diastolic CHF (congestive heart failure) (HCC)   CKD (chronic kidney disease), stage III (HCC)   Hx of stroke without residual deficits   Essential hypertension   OSA (obstructive sleep apnea)   ICD (implantable cardioverter-defibrillator) in place   Right sided weakness   Thalamic infarct, acute (HCC)   AKI (acute kidney injury) ([place])   Peripheral edema   Stage 3 chronic kidney disease (HCC)   Benign essential HTN   History of CVA with residual deficit   Cerebrovascular accident (CVA) due to thrombosis of left middle cerebral artery ([place])   1.  Acute to subacute CVA presenting with right-sided weakness in a patient with history of CVA -MRI confirmed that she had acute to subacute CVA.  Neurology evaluation  appreciated -Continue aspirin and Plavix for 3 weeks followed by Plavix alone as per neurology recommendations.  Continue high-dose statin.  Outpatient follow-up with neurology -PT/OT recommended CIR.  Discharge to CIR today. Diet as per SLP recommendations  -Echo showed EF of 45-50% with grade 1 diastolic dysfunction -AICD was interrogated with no findings  2.Chronic combined systolic & diastolic CHF -Appears well-compensated -Hold Lasix, Aldactone and Entresto considering slight bump in creatinine.  Repeat creatinine in the next few days and these medications can be gradually restarted.  Continue Coreg and Bidil if blood pressure allows -Strict input and output.  Daily weights.  Outpatient follow-up with cardiology  3.Hypertension -Monitor blood pressure.  Use Coreg and BiDil if systolic blood pressures above 100.  Hold other antihypertensives as mentioned above  4.CKD stage III -With slight bump in creatinine.  Repeat creatinine CIR in the next few days     Discharge Instructions  Discharge Instructions    Ambulatory referral to Cardiology   Complete by:  As directed    Follow up for chf   Ambulatory referral to Neurology   Complete by:  As directed    Pt will follow up with Dr. Posey Pronto at [facility] in about 4-6 weeks. Thanks.   Call MD for:  difficulty breathing, headache or visual disturbances   Complete by:  As directed    Call MD for:  extreme fatigue   Complete by:  As directed    Call MD for:  hives   Complete by:  As directed    Call MD for:  persistant dizziness or light-headedness   Complete by:  As directed    Call MD for:  persistant nausea and vomiting   Complete by:  As directed    Call MD for:  severe uncontrolled pain   Complete by:  As directed    Call MD for:  temperature >100.4   Complete by:  As directed    Diet - low sodium heart healthy   Complete by:  As directed    Discharge instructions   Complete by:  As directed    Diet as per SLP  recommendations   Increase activity slowly   Complete by:  As directed      Allergies as of 04/22/2017      Reactions   Shellfish Allergy Hives      Medication List    STOP taking these medications   furosemide 40 MG tablet Commonly known as:  LASIX   sacubitril-valsartan 97-103 MG Commonly known as:  ENTRESTO   spironolactone 25 MG tablet Commonly known as:  ALDACTONE     TAKE these medications   allopurinol 100 MG tablet Commonly known as:  ZYLOPRIM Take 200 mg by mouth daily.   aspirin EC 81 MG tablet Take 1 tablet (81 mg total) by mouth daily.   atorvastatin 80 MG tablet Commonly known as:  LIPITOR Take 1 tablet (80 mg total) by mouth daily at 6 PM. What changed:    medication strength  how much to take  when to take this   carvedilol 25 MG tablet Commonly known as:  COREG Take 1 tablet (25 mg total) by mouth 2 (two) times daily.   clopidogrel 75 MG tablet Commonly known as:  PLAVIX Take 1 tablet (75 mg total) by mouth daily. Start taking on:  04/23/2017   colchicine 0.6 MG tablet Take 0.6 mg by mouth daily.   gabapentin 300 MG capsule Commonly known as:  NEURONTIN Take 300 mg by mouth 3 (three) times daily.   isosorbide-hydrALAZINE 20-37.5 MG tablet Commonly known as:  BIDIL Take 2 tablets by mouth 2 (two) times daily.   omeprazole 40 MG capsule Commonly known as:  PRILOSEC Take 40 mg by mouth daily.   senna-docusate 8.6-50 MG tablet Commonly known as:  Senokot-S Take 1 tablet by mouth at bedtime as needed for mild constipation.      Follow-up Information    Alda Berthold, DO. Schedule an appointment as soon as possible for a visit in 6 week(s).   Specialty:  Neurology Contact information: [address] [place] [address] [phone]        Fanny Bien, MD. Schedule an appointment as soon as possible for a visit in 1 week(s).   Specialty:  Family Medicine Why:  with repeat CBC/BMP Contact  information: [address] [place] [address] [phone]          Allergies  Allergen Reactions  . Shellfish Allergy Hives    Consultations:  Neurology   Procedures/Studies: Dg Chest 2 View  Result Date: 04/14/2017 CLINICAL DATA:  Weakness in both legs.  History of 3 strokes. EXAM: CHEST - 2 VIEW COMPARISON:  03/16/2017 FINDINGS: Stable mild cardiac enlargement with atherosclerotic tortuous thoracic aorta. ICD device is unchanged in appearance with lead in the right ventricle. Minimal atelectasis is seen at the lung bases right greater than left. No pneumonic consolidation or CHF. Subchondral degenerative cystic change noted of the glenoids of both shoulders. IMPRESSION: No active cardiopulmonary disease. Electronically Signed   By: Ashley Royalty M.D.   On: 04/14/2017 14:19   Ct Head Wo  Contrast  Result Date: 04/18/2017 CLINICAL DATA:  Right leg weakness EXAM: CT HEAD WITHOUT CONTRAST TECHNIQUE: Contiguous axial images were obtained from the base of the skull through the vertex without intravenous contrast. COMPARISON:  04/14/2017 FINDINGS: Brain: Mild atrophic changes are noted. Chronic white matter ischemic changes are seen. A focal lacunar infarct is noted in the right cerebellar hemisphere stable from the previous exam. No acute hemorrhage or acute infarction is seen. Vascular: No hyperdense vessel or unexpected calcification. Skull: Normal. Negative for fracture or focal lesion. Sinuses/Orbits: No acute finding. Other: None. IMPRESSION: Chronic atrophic and ischemic changes without acute abnormality. Electronically Signed   By: Inez Catalina M.D.   On: 04/18/2017 17:34   Ct Head Wo Contrast  Result Date: 04/14/2017 CLINICAL DATA:  Head trauma, status post fall EXAM: CT HEAD WITHOUT CONTRAST TECHNIQUE: Contiguous axial images were obtained from the base of the skull through the vertex without intravenous contrast. COMPARISON:  None. FINDINGS: Brain: No evidence of acute  infarction, hemorrhage, extra-axial collection, ventriculomegaly, or mass effect. Generalized cerebral atrophy. Periventricular white matter low attenuation likely secondary to microangiopathy. Vascular: Cerebrovascular atherosclerotic calcifications are noted. Skull: Negative for fracture or focal lesion. Sinuses/Orbits: Visualized portions of the orbits are unremarkable. Visualized portions of the paranasal sinuses and mastoid air cells are unremarkable. Other: None. IMPRESSION: 1. No acute intracranial pathology. 2. Chronic microvascular disease and cerebral atrophy. Electronically Signed   By: Kathreen Devoid   On: 04/14/2017 15:11   Mr Jodene Nam Head Wo Contrast  Result Date: 04/19/2017 CLINICAL DATA:  77 year old female with progressive right lower extremity weakness. Query recent stroke. EXAM: MRI HEAD WITHOUT CONTRAST MRA HEAD WITHOUT CONTRAST TECHNIQUE: Multiplanar, multiecho pulse sequences of the brain and surrounding structures were obtained without intravenous contrast. Angiographic images of the head were obtained using MRA technique without contrast. COMPARISON:  Head CT without contrast 04/18/2017. Lumbar spine MRI today reported separately. FINDINGS: MRI HEAD FINDINGS Brain: There is an oval 12 millimeter area of restricted diffusion involving the posterior lamina of the left internal capsule on series 3, image 25. there is associated T2 and FLAIR hyperintensity with mild T1 hypointensity. No associated hemorrhage or mass effect. No other diffusion restriction. Numerous chronic small or lacunar infarcts are scattered in both cerebellar hemispheres, the pons, the bilateral deep gray matter nuclei, and the cerebral white matter including the body of the corpus callosum. No cortical encephalomalacia is identified. There are occasional chronic micro hemorrhages, including in the right thalamus and cerebellum. No midline shift, mass effect, evidence of mass lesion, ventriculomegaly, extra-axial collection or  acute intracranial hemorrhage. Cervicomedullary junction and pituitary are within normal limits. Vascular: Major intracranial vascular flow voids are preserved. See intracranial MRA findings below. Skull and upper cervical spine: Negative visible cervical spine. Normal bone marrow signal. Hyperostosis of the calvarium, normal variant. Sinuses/Orbits: Postoperative changes to the globes, otherwise normal orbits soft tissues. Paranasal sinuses and mastoids are stable and well pneumatized. Other: Visible internal auditory structures appear normal. Scalp and face soft tissues appear negative. MRA HEAD FINDINGS Tortuosity of the distal vertebral arteries likely in part responsible for apparent decreased flow signal in the proximal V4 segments. However, the left V4 segment may be stenotic. The right V4 is dominant with no stenosis suspected. Patent basilar artery with mild irregularity and stenosis. Dominant AICAs suspected. Normal SCA origins. Fetal type PCA origins. Mildly irregular bilateral PCA branches. On the source images distal PCA flow signal appears symmetric. Antegrade flow signal in both ICA siphons. No siphon stenosis  identified, but there is short segment of fusiform aneurysmal enlargement of the left cavernous ICA up to 8 mm diameter (series 4, image 57). The bilateral ophthalmic and posterior communicating artery origins are normal. Patent carotid termini. Dominant left ACA A1 segment. The right is mildly irregular and stenotic. Anterior communicating artery and ACA A2 segments appear normal. MCA M1 segments and MCA bifurcations are patent with mild irregularity but no significant stenosis. Visible MCA branches are patent with mild irregularity. IMPRESSION: 1. Acute to early subacute lacunar infarct in the posterior limb of left internal capsule. No associated hemorrhage or mass effect. 2. Severe underlying chronic small vessel disease. Chronic lacunar infarcts in the bilateral deep gray matter nuclei,  brainstem, cerebellum, and cerebral white matter. 3. 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 4. Intracranial atherosclerosis. Hemodynamically significant stenosis is suspected only in the distal left vertebral artery. Electronically Signed   By: Genevie Ann M.D.   On: 04/19/2017 15:25   Mr Brain Wo Contrast  Result Date: 04/19/2017 CLINICAL DATA:  77 year old female with progressive right lower extremity weakness. Query recent stroke. EXAM: MRI HEAD WITHOUT CONTRAST MRA HEAD WITHOUT CONTRAST TECHNIQUE: Multiplanar, multiecho pulse sequences of the brain and surrounding structures were obtained without intravenous contrast. Angiographic images of the head were obtained using MRA technique without contrast. COMPARISON:  Head CT without contrast 04/18/2017. Lumbar spine MRI today reported separately. FINDINGS: MRI HEAD FINDINGS Brain: There is an oval 12 millimeter area of restricted diffusion involving the posterior lamina of the left internal capsule on series 3, image 25. there is associated T2 and FLAIR hyperintensity with mild T1 hypointensity. No associated hemorrhage or mass effect. No other diffusion restriction. Numerous chronic small or lacunar infarcts are scattered in both cerebellar hemispheres, the pons, the bilateral deep gray matter nuclei, and the cerebral white matter including the body of the corpus callosum. No cortical encephalomalacia is identified. There are occasional chronic micro hemorrhages, including in the right thalamus and cerebellum. No midline shift, mass effect, evidence of mass lesion, ventriculomegaly, extra-axial collection or acute intracranial hemorrhage. Cervicomedullary junction and pituitary are within normal limits. Vascular: Major intracranial vascular flow voids are preserved. See intracranial MRA findings below. Skull and upper cervical spine: Negative visible cervical spine. Normal bone marrow signal. Hyperostosis of the calvarium, normal  variant. Sinuses/Orbits: Postoperative changes to the globes, otherwise normal orbits soft tissues. Paranasal sinuses and mastoids are stable and well pneumatized. Other: Visible internal auditory structures appear normal. Scalp and face soft tissues appear negative. MRA HEAD FINDINGS Tortuosity of the distal vertebral arteries likely in part responsible for apparent decreased flow signal in the proximal V4 segments. However, the left V4 segment may be stenotic. The right V4 is dominant with no stenosis suspected. Patent basilar artery with mild irregularity and stenosis. Dominant AICAs suspected. Normal SCA origins. Fetal type PCA origins. Mildly irregular bilateral PCA branches. On the source images distal PCA flow signal appears symmetric. Antegrade flow signal in both ICA siphons. No siphon stenosis identified, but there is short segment of fusiform aneurysmal enlargement of the left cavernous ICA up to 8 mm diameter (series 4, image 57). The bilateral ophthalmic and posterior communicating artery origins are normal. Patent carotid termini. Dominant left ACA A1 segment. The right is mildly irregular and stenotic. Anterior communicating artery and ACA A2 segments appear normal. MCA M1 segments and MCA bifurcations are patent with mild irregularity but no significant stenosis. Visible MCA branches are patent with mild irregularity. IMPRESSION: 1.  Acute to early subacute lacunar infarct in the posterior limb of left internal capsule. No associated hemorrhage or mass effect. 2. Severe underlying chronic small vessel disease. Chronic lacunar infarcts in the bilateral deep gray matter nuclei, brainstem, cerebellum, and cerebral white matter. 3. 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 4. Intracranial atherosclerosis. Hemodynamically significant stenosis is suspected only in the distal left vertebral artery. Electronically Signed   By: Genevie Ann M.D.   On: 04/19/2017 15:25   Mr Lumbar  Spine Wo Contrast  Result Date: 04/19/2017 CLINICAL DATA:  approximately 1 month ago daughter noticed her mother requires more assistance moving her right leg while getting into the car. Last Thursday, daughter reports even worsening right leg weakness with patient being unable to get up from her bed without any support. EXAM: MRI LUMBAR SPINE WITHOUT CONTRAST TECHNIQUE: Multiplanar, multisequence MR imaging of the lumbar spine was performed. No intravenous contrast was administered. COMPARISON:  None. FINDINGS: Segmentation:  Standard. Alignment:  Physiologic. Vertebrae:  No fracture, evidence of discitis, or bone lesion. Conus medullaris and cauda equina: Conus extends to the L1 level. Conus and cauda equina appear normal. Paraspinal and other soft tissues: No acute paraspinal abnormality. Right renal cyst. Disc levels: Disc spaces: Disc desiccation at L3-4, L4-5 and L5-S1. T12-L1: No significant disc bulge. No evidence of neural foraminal stenosis. No central canal stenosis. L1-L2: No significant disc bulge. No evidence of neural foraminal stenosis. No central canal stenosis. L2-L3: No significant disc bulge. No evidence of neural foraminal stenosis. No central canal stenosis. L3-L4: Mild broad-based disc bulge eccentric towards the right. Moderate bilateral facet arthropathy. Mild spinal stenosis. No evidence of neural foraminal stenosis. L4-L5: Broad-based disc bulge with an annular fissure. Moderate bilateral facet arthropathy with ligamentum flavum infolding. Bilateral lateral recess stenosis. No evidence of neural foraminal stenosis. No central canal stenosis. L5-S1: Broad-based disc bulge. No evidence of neural foraminal stenosis. No central canal stenosis. IMPRESSION: 1. At L4-5 there is a broad-based disc bulge with an annular fissure. Moderate bilateral facet arthropathy with ligamentum flavum infolding. Bilateral lateral recess stenosis. 2. At L3-4 there is a mild broad-based disc bulge eccentric  towards the right. Moderate bilateral facet arthropathy. Mild spinal stenosis. Electronically Signed   By: Kathreen Devoid   On: 04/19/2017 15:45   [place] Ekg Site Rite  Result Date: 04/19/2017 If Site Rite image not attached, placement could not be confirmed due to current cardiac rhythm.   Echo  Study Conclusions  - Left ventricle: The cavity size was normal. Wall thickness was increased in a pattern of moderate LVH. Systolic function was mildly reduced. The estimated ejection fraction was in the range of 45% to 50%. Diffuse hypokinesis. Doppler parameters are consistent with abnormal left ventricular relaxation (grade 1 diastolic dysfunction). The E/e&' ratio is >15, suggesting elevated LV filling pressure. - Mitral valve: Mildly thickened leaflets . There was trivial regurgitation. - Left atrium: The atrium was normal in size. - Systemic veins: The IVC measures <2.1 cm, but collapses >50%, suggesting an elevated RA pressure of 8 mmHg.  Impressions:  - Comapared to a prior study in 06/2016, the LVEF is higher at 45-50% with global hypokinesis, grade 1 DD and elevated LV filling pressure.     Subjective: Patient seen and examined at bedside.  She denies any overnight fever, nausea, vomiting or worsening weakness.  Discharge Exam: Vitals:   04/22/17 0915 04/22/17 1312  BP: (!) 94/59 (!) 114/56  Pulse: 73 73  Resp:  18 20  Temp: 98 F (36.7 C) (!) 97.4 F (36.3 C)  SpO2: 97% 98%   Vitals:   04/21/17 2001 04/22/17 0403 04/22/17 0915 04/22/17 1312  BP: 129/64 106/60 (!) 94/59 (!) 114/56  Pulse: 76 77 73 73  Resp: 16 16 18 20   Temp: 98.2 F (36.8 C) 98.2 F (36.8 C) 98 F (36.7 C) (!) 97.4 F (36.3 C)  TempSrc: Oral Oral Oral Oral  SpO2: 98% 97% 97% 98%  Weight:        General: Pt is alert, awake, not in acute distress Cardiovascular: Rate controlled, S1/S2 + Respiratory: Bilateral decreased breath sounds at bases Abdominal: Soft, NT, ND,  bowel sounds + Extremities: no edema, no cyanosis Central nervous system: Alert and oriented. No focal neurological deficits. Moving extremities.  Still has right lower extremity weakness with slight improvement      The results of significant diagnostics from this hospitalization (including imaging, microbiology, ancillary and laboratory) are listed below for reference.     Microbiology: No results found for this or any previous visit (from the past 240 hour(s)).   Labs: BNP (last 3 results) Recent Labs    06/03/16 1034 03/16/17 1825  BNP 11.5 9.3   Basic Metabolic Panel: Recent Labs  Lab 04/18/17 1755 04/18/17 1800 04/20/17 0807 04/21/17 0507 04/22/17 0429  NA 137 139 137 135 136  K 5.0 5.1 5.1 4.5 4.8  CL 105 104 105 101 103  CO2 21*  --  21* 23 24  GLUCOSE 92 95 103* 99 116*  BUN 36* 45* 40* 39* 43*  CREATININE 1.69* 1.70* 1.97* 1.74* 2.20*  CALCIUM 10.1  --  10.0 10.2 10.0  MG  --   --   --  1.7 1.6*   Liver Function Tests: Recent Labs  Lab 04/18/17 1755  AST 46*  ALT 30  ALKPHOS 76  BILITOT 1.1  PROT 6.5  ALBUMIN 3.8   No results for input(s): LIPASE, AMYLASE in the last 168 hours. No results for input(s): AMMONIA in the last 168 hours. CBC: Recent Labs  Lab 04/18/17 1755 04/18/17 1800 04/21/17 0507  WBC 4.2  --  3.6*  NEUTROABS 2.4  --  1.8  HGB 12.0 12.2 12.6  HCT 37.4 36.0 39.4  MCV 91.4  --  92.1  PLT PLATELETS APPEAR ADEQUATE  --  144*   Cardiac Enzymes: Recent Labs  Lab 04/19/17 0938  CKTOTAL 370*   BNP: Invalid input(s): POCBNP CBG: Recent Labs  Lab 04/19/17 0222  GLUCAP 74   D-Dimer No results for input(s): DDIMER in the last 72 hours. Hgb A1c No results for input(s): HGBA1C in the last 72 hours. Lipid Profile No results for input(s): CHOL, HDL, LDLCALC, TRIG, CHOLHDL, LDLDIRECT in the last 72 hours. Thyroid function studies No results for input(s): TSH, T4TOTAL, T3FREE, THYROIDAB in the last 72 hours.  Invalid  input(s): FREET3 Anemia work up No results for input(s): VITAMINB12, FOLATE, FERRITIN, TIBC, IRON, RETICCTPCT in the last 72 hours. Urinalysis    Component Value Date/Time   COLORURINE YELLOW 03/16/2017 1937   APPEARANCEUR CLEAR 03/16/2017 1937   LABSPEC 1.011 03/16/2017 1937   PHURINE 5.0 03/16/2017 1937   GLUCOSEU NEGATIVE 03/16/2017 1937   HGBUR NEGATIVE 03/16/2017 1937   BILIRUBINUR NEGATIVE 03/16/2017 1937   KETONESUR NEGATIVE 03/16/2017 1937   PROTEINUR NEGATIVE 03/16/2017 1937   NITRITE NEGATIVE 03/16/2017 1937   LEUKOCYTESUR NEGATIVE 03/16/2017 1937   Sepsis Labs Invalid input(s): PROCALCITONIN,  WBC,  LACTICIDVEN Microbiology No  results found for this or any previous visit (from the past 240 hour(s)).   Time coordinating discharge: 35 minutes  SIGNED:   Aline August, MD  [facility] 04/22/2017, 2:53 PM Pager: [phone]  If 7PM-7AM, please contact night-coverage [email or web address] Password [email or web address]

## 2017-04-22 NOTE — Progress Notes (Signed)
Physical Medicine and Rehabilitation Consult Reason for Consult: Right sided weakness and dysarthria Referring Physician: [facility]   HPI: Tara Dunn is a 77 y.o. 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 Per chart review and patient, patient lives alone however she has been staying with her daughter for the past week she was needing more assistance with mobility using a rolling walker for ambulation. She states she needed  assistance wiht all ADLs PTA.  One level home. Presented 04/19/2017 with right-sided weakness as well as slurred speech. Creatinine 1.69, EKG sinus rhythm with first degree AV nodal block.. Troponin negative. CT reviewe,d unremarkable for acute process. MRI as well as MRA showed acute early subacute lacunar infarct posterior limb of left internal capsule. No associated hemorrhage or mass effect. 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 MRI lumbar spine showed L3-4 and L4-5 broad-based disc bulge with annular fissure. Mild spinal stenosis. Patient did not receive TPA. Echocardiogram with ejection fraction of 03% grade 1 diastolic dysfunction. Neurology consulted presently on aspirin 325 mg daily for CVA prophylaxis. Tolerating a regular diet. Physical occupational therapy evaluations completed 04/20/2017 with recommendations of physical medicine rehabilitation consult.   Review of Systems  Constitutional: Negative for chills and fever.  HENT: Negative for hearing loss.   Eyes: Negative for blurred vision and double vision.  Respiratory: Negative for cough and shortness of breath.   Cardiovascular: Positive for leg swelling. Negative for chest pain and palpitations.  Gastrointestinal: Positive for constipation. Negative for nausea  and vomiting.       GERD  Genitourinary: Negative for dysuria, flank pain and hematuria.  Musculoskeletal: Positive for joint pain and myalgias.  Skin: Negative for rash.  Neurological: Positive for speech change and focal weakness.  Psychiatric/Behavioral: Positive for depression.  All other systems reviewed and are negative.      Past Medical History:  Diagnosis Date  . 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 Lovena Le  . Aortic dilatation ([place])    a. 04/2014 CTA chest w/ incidental finding of distal thoracic Ao enlargement of 3.39 mm - f/u needed 04/2015.  . Arthritis    "all over my body"  . Cancer of left breast ([place]) 2009   s/p L mastectomy and chemo.  . Cardiomyopathy ([place])    a. 04/2014 Echo: EF 25-30%, mod conc LVH, possibl antsept HK, Gr1 DD, mod-sev dil LA.  . CHF (congestive heart failure) ([place])   . CKD (chronic kidney disease), stage III ([place])   . Depression   . GERD (gastroesophageal reflux disease)   . Gout   . Heart murmur   . History of stomach ulcers   . Hypertension    a. Dx @ age 2;  b. 04/2014 admission for HTN emergency.  . Obesity (BMI 30-39.9) 01/21/2015  . OSA on CPAP   . Stroke [facility])    a. 3 strokes - last in 1998; "memory problems since"  (03/26/2015)        Past Surgical History:  Procedure Laterality Date  . ABDOMINAL HYSTERECTOMY    . BREAST BIOPSY Left 2008  . CATARACT EXTRACTION, BILATERAL Bilateral   . DILATION AND CURETTAGE OF UTERUS    . EP IMPLANTABLE DEVICE N/A 03/26/2015   MDT single chamber ICD, Dr. Lovena Le  . LEFT HEART CATHETERIZATION  WITH CORONARY ANGIOGRAM N/A 05/07/2014   Procedure: LEFT HEART CATHETERIZATION WITH CORONARY ANGIOGRAM;  Surgeon: Belva Crome, MD;  Location: [facility] CATH LAB;  Service: Cardiovascular;  Laterality: N/A;  . MASTECTOMY Left 2009        Family History  Problem Relation Age of Onset  . High blood pressure Mother        Died @ 73.  . High blood  pressure Father   . Heart attack Father        Died in his early 66's.  . High blood pressure Sister   . Cancer Sister   . Cancer Daughter   . Heart disease Daughter    Social History:  reports that she has quit smoking. Her smoking use included cigarettes. She has a 20.00 pack-year smoking history. she has never used smokeless tobacco. She reports that she does not drink alcohol or use drugs. Allergies:      Allergies  Allergen Reactions  . Shellfish Allergy Hives         Medications Prior to Admission  Medication Sig Dispense Refill  . allopurinol (ZYLOPRIM) 100 MG tablet Take 200 mg by mouth daily.   0  . aspirin EC 81 MG tablet Take 1 tablet (81 mg total) by mouth daily. 90 tablet 3  . atorvastatin (LIPITOR) 40 MG tablet Take 1 tablet (40 mg total) by mouth daily. 30 tablet 0  . carvedilol (COREG) 25 MG tablet Take 1 tablet (25 mg total) by mouth 2 (two) times daily. 60 tablet 0  . colchicine 0.6 MG tablet Take 0.6 mg by mouth daily.    . furosemide (LASIX) 40 MG tablet Take 1 tablet (40 mg total) by mouth daily. 90 tablet 3  . gabapentin (NEURONTIN) 300 MG capsule Take 300 mg by mouth 3 (three) times daily.    . isosorbide-hydrALAZINE (BIDIL) 20-37.5 MG tablet Take 2 tablets by mouth 2 (two) times daily. 120 tablet 11  . omeprazole (PRILOSEC) 40 MG capsule Take 40 mg by mouth daily.  0  . sacubitril-valsartan (ENTRESTO) 97-103 MG Take 1 tablet by mouth 2 (two) times daily. 60 tablet 3  . spironolactone (ALDACTONE) 25 MG tablet Take 1 tablet (25 mg total) by mouth daily. 30 tablet 3    Home: Home Living Family/patient expects to be discharged to:: Private residence Living Arrangements: Alone, Children Available Help at Discharge: Family, Available 24 hours/day Type of Home: Apartment Home Access: Level entry Home Layout: One level Bathroom Shower/Tub: Multimedia programmer: Standard Home Equipment: Environmental consultant - 2 wheels, Cane - single point, Bedside  commode  Functional History: Prior Function Level of Independence: Needs assistance Gait / Transfers Assistance Needed: Uses RW for ambulation; has needed more help recently due to weakness. ADL's / Homemaking Assistance Needed: Assist with bathing from daughter. Comments: Has been staying with daughter for the last week. Functional Status:  Mobility: Bed Mobility Overal bed mobility: Needs Assistance Bed Mobility: Rolling, Sidelying to Sit Rolling: Min guard Sidelying to sit: Min assist, HOB elevated General bed mobility comments: Increased time to get to EOB with use of rail and assist to elevate trunk as pt not able to push through RUE.  Transfers Overall transfer level: Needs assistance Equipment used: Rolling walker (2 wheeled) Transfers: Sit to/from Stand, [employer] Transfers Sit to Stand: Mod assist, +2 safety/equipment Stand pivot transfers: Mod assist, +2 physical assistance General transfer comment: Assist to power to standing wtih cues for hand placement, despite pulling up on RW. Stood from [employer]  x1, from [facility] x1, SPT bed to/from [facility] and bed to chair with mod A for balance, RW sequencing. Difficulty advancing LLE and WB through RLE. Increased knee flexion bilaterally.  ADL: ADL Overall ADL's : Needs assistance/impaired Eating/Feeding: Set up, Sitting Grooming: Set up, Sitting Upper Body Bathing: Set up, Sitting, Minimal assistance Lower Body Bathing: Maximal assistance, Sit to/from stand, +2 for safety/equipment Upper Body Dressing : Minimal assistance, Set up, Sitting Lower Body Dressing: Maximal assistance, +2 for safety/equipment, Sit to/from stand Toilet Transfer: Moderate assistance, +2 for physical assistance, Stand-pivot, BSC, RW Toileting- Clothing Manipulation and Hygiene: Moderate assistance, +2 for safety/equipment, Sit to/from stand Tub/ Shower Transfer: Moderate assistance, Stand-pivot, +2 for physical assistance General ADL Comments: Pt completed bed  mobility, SPT EOB>BSC>EOB>recliner.   Cognition: Cognition Overall Cognitive Status: No family/caregiver present to determine baseline cognitive functioning Orientation Level: Oriented to person, Oriented to place, Oriented to time Attention: Sustained Sustained Attention: Appears intact Cognition Arousal/Alertness: Awake/alert Behavior During Therapy: WFL for tasks assessed/performed Overall Cognitive Status: No family/caregiver present to determine baseline cognitive functioning General Comments: A&Ox4. Not formally assessed.  Blood pressure 117/61, pulse 72, temperature 98 F (36.7 C), temperature source Oral, resp. rate 18, weight 99.1 kg (218 lb 7.6 oz), SpO2 99 %. Physical Exam  Vitals reviewed. Constitutional: She is oriented to person, place, and time. She appears well-developed.  77 year old obese female  HENT:  Head: Normocephalic and atraumatic.  Eyes: EOM are normal. Right eye exhibits no discharge. Left eye exhibits no discharge.  Neck: Normal range of motion. Neck supple. No thyromegaly present.  Cardiovascular: Normal rate, regular rhythm and normal heart sounds.  Respiratory: Effort normal and breath sounds normal. No respiratory distress.  GI: Soft. Bowel sounds are normal. She exhibits no distension.  Musculoskeletal:  LE edema  Neurological: She is alert and oriented to person, place, and time.  Speech is dysarthric but intelligible.  Follow simple commands. Right facial droop Motor: B/l UE: 4-/5 proximal to distal RLE: HF, KE 2+/5, ADF/PF 4+/5 LLE: HF, KE 3-/5, AF 3+/5 Dysarthria  Skin: Skin is warm and dry.  Psychiatric: She has a normal mood and affect. Her behavior is normal.  Assessment/Plan: Diagnosis: lacunar infarct posterior limb of left internal  Labs and images independently reviewed.  Records reviewed and summated above Stroke: Continue secondary stroke prophylaxis and Risk Factor Modification listed below:   Antiplatelet therapy:   Blood  Pressure Management:  Continue current medication with prn's with permisive HTN per primary team Statin Agent:   Prediabetes management:   Motor recovery: Fluoxetine  1. Does the need for close, 24 hr/day medical supervision in concert with the patient's rehab needs make it unreasonable for this patient to be served in a less intensive setting? Potentially  2. 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 Due to safety, disease management and patient education, does the patient require 24 hr/day rehab nursing? Yes 4. Does the patient require coordinated care of a physician, rehab nurse, PT (1-2 hrs/day, 5 days/week), OT (1-2 hrs/day, 5 days/week) and SLP (1-2 hrs/day, 5 days/week) to address physical and functional deficits in the context of the  above medical diagnosis(es)? Yes Addressing deficits in the following areas: balance, endurance, locomotion, strength, transferring, bathing, dressing, toileting, cognition, speech and psychosocial support 5. Can the patient actively participate in an intensive therapy program of at least 3 hrs of therapy per day at least 5 days per week? Yes 6. The potential for patient to make measurable gains while on inpatient rehab is excellent 7. Anticipated functional outcomes upon discharge from inpatient rehab are supervision and min assist  with PT, supervision and min assist with OT, modified independent with SLP. 8. Estimated rehab length of stay to reach the above functional  goals is: 14-17 days. 9. Anticipated D/C setting: Home 10. Anticipated post D/C treatments: HH therapy and Home excercise program 11. Overall Rehab/Functional Prognosis: good and fair  RECOMMENDATIONS: This patient's condition is appropriate for continued rehabilitative care in the following setting: Need to clarify baseline level of functioning as pt states she needed assistance with all ADLs. If functional deficits present beyond baseline, recommend CIR. Patient has agreed to participate in recommended program. Potentially Note that insurance prior authorization may be required for reimbursement for recommended care.  Comment: Rehab Admissions Coordinator to follow up.  Delice Lesch, MD, ABPMR Lavon Paganini Angiulli, PA-C 04/20/2017          Revision History                        Routing History

## 2017-04-22 NOTE — Care Management Note (Signed)
Case Management Note  Patient Details  Name: Tara Dunn MRN: [id number] Date of Birth: 09/30/1940  Subjective/Objective:   Pt admitted with CVA. She is from home with her daughter.                 Action/Plan: Pt discharging to CIR. CM signing off.  Expected Discharge Date:  04/22/17               Expected Discharge Plan:  [place]  In-House Referral:     Discharge planning Services  CM Consult  Post Acute Care Choice:    Choice offered to:     DME Arranged:    DME Agency:     HH Arranged:    HH Agency:     Status of Service:  Completed, signed off  If discussed at [employer], dates discussed:    Additional Comments:  Pollie Friar, RN 04/22/2017, 3:02 PM

## 2017-04-22 NOTE — Progress Notes (Signed)
Patient transfer to rehab [id number],  Vital signs stable,report given to RN at Rehab.  Patient comfortable not in pain. Bath given.

## 2017-04-22 NOTE — Progress Notes (Signed)
Inpatient Rehabilitation  Continuing to follow along for a hopeful insurance decision today. Will update the team as I know.  Call if questions.   Carmelia Roller., CCC/SLP Admission Coordinator  [place]  Cell [phone]

## 2017-04-22 NOTE — H&P (Signed)
Physical Medicine and Rehabilitation Admission H&P       Chief Complaint  Patient presents with  . Extremity Weakness  : HPI: Tara Dunn a 77 y.o.right handed femalewith history of left breast cancer with mastectomy chemotherapy 2009, chronic right lower extremity edema, cardiomyopathy with AICD placement 2017 per Dr. Lovena Le, history of chronic combined systolic and diastolic congestive heart failure, chronic kidney disease stage III, hypertension and CVA3 with minimal left side weakness andmaintained on aspirin 81 mg daily. Per chart review and patient,patient lives alone however she has been staying with her daughter for the past week she was needing more assistance with mobility using a rolling walker for ambulation.She states she needed assistance wiht all ADLs PTA. One level home. Presented 04/19/2017 with right-sided weakness as well as slurred speech. Creatinine 1.69, EKG sinus rhythm with first degree AV nodal block.. Troponin negative.CT reviewe, unremarkable for acute process.MRI as well as MRA showed acute early subacute lacunar infarct posterior limb of left internal capsule. No associated hemorrhage or mass effect. 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 MRI lumbar spine showed L3-4 and L4-5 broad-based disc bulge with annular fissure. Mild spinal stenosis. Patient did not receive TPA. AICD was interrogated with no findings. Echocardiogram with ejection fraction of 542%grade 1 diastolic dysfunction. Neurology consulted presently on aspirin  daily and Plavix for CVA prophylaxis 3 weeks then Plavix alone. Blood pressures a bit soft with Lasix and  Entresto held and resume as tolerated. Tolerating a regular diet. Physical and occupational therapy evaluations completed 04/20/2017 with recommendations of physical medicine rehabilitation consult patient was admitted for a comp rehabilitation program    Review of Systems  Constitutional:  Negative for chills and fever.  HENT: Negative for hearing loss.   Eyes: Negative for blurred vision and double vision.  Cardiovascular: Positive for leg swelling. Negative for chest pain.  Gastrointestinal: Positive for constipation. Negative for nausea and vomiting.       GERD  Genitourinary: Negative for dysuria and hematuria.  Musculoskeletal: Positive for joint pain.  Skin: Negative for rash.  Neurological: Positive for speech change and focal weakness.  Psychiatric/Behavioral: Positive for depression.  All other systems reviewed and are negative.      Past Medical History:  Diagnosis Date  . 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 TLovena Le . [place]ortic dilatation ([facility] [place]    a. 04/2014 CTA chest w/ incidental finding of distal thoracic Ao enlargement of 3.39 mm - f/u needed 04/2015.  . Arthritis    "all over my body"  . [place] of left breast ([facility]   s/p L mastectomy and chemo.  . Cardiomyopathy ([facility]    a. 04/2014 Echo: EF 25-30%, mod conc LVH, possibl antsept HK, Gr1 DD, mod-sev dil LA.  . CH[place] ([place]estive heart failure) ([facility]   . CKD (chronic kidney disease), stage III ([place]   . Depression   . GERD (gastroesophageal reflux disease)   . Gout   . Heart murmur   . History of stomach ulcers   . Hypertension    a. Dx @ age 77  b. 04/2014 admission for HTN emergency.  . Obesity (BMI 30-39.9) 01/21/2015  . OSA on CPAP   . Stroke ([facility]    a. 3 strokes - last in 1998; "memory problems since"  (03/26/2015)        Past Surgical History:  Procedure Laterality Date  . ABDOMINAL HYSTERECTOMY    . BREAST BIOPSY Left 2008  .  CATARACT EXTRACTION, BILATERAL Bilateral   . DILATION AND CURETTAGE OF UTERUS    . EP IMPLANTABLE DEVICE N/A 03/26/2015   MDT single chamber ICD, Dr. Lovena Le  . LEFT HEART CATHETERIZATION WITH CORONARY ANGIOGRAM N/A 05/07/2014   Procedure: LEFT HEART CATHETERIZATION WITH CORONARY ANGIOGRAM;  Surgeon:  Belva Crome, MD;  [facility] LAB;  Service: Cardiovascular;  Laterality: N/A;  . MASTECTOMY Left 2009        Family History  Problem Relation Age of Onset  . High blood pressure Mother        Died @ 50.  . High blood pressure Father   . Heart attack Father        Died in his early 88's.  . High blood pressure Sister   . Cancer Sister   . Cancer Daughter   . Heart disease Daughter    Social History:  reports that she has quit smoking. Her smoking use included cigarettes. She has a 20.00 pack-year smoking history. she has never used smokeless tobacco. She reports that she does not drink alcohol or use drugs. Allergies:      Allergies  Allergen Reactions  . Shellfish Allergy Hives         Medications Prior to Admission  Medication Sig Dispense Refill  . allopurinol (ZYLOPRIM) 100 MG tablet Take 200 mg by mouth daily.   0  . aspirin EC 81 MG tablet Take 1 tablet (81 mg total) by mouth daily. 90 tablet 3  . atorvastatin (LIPITOR) 40 MG tablet Take 1 tablet (40 mg total) by mouth daily. 30 tablet 0  . carvedilol (COREG) 25 MG tablet Take 1 tablet (25 mg total) by mouth 2 (two) times daily. 60 tablet 0  . colchicine 0.6 MG tablet Take 0.6 mg by mouth daily.    . furosemide (LASIX) 40 MG tablet Take 1 tablet (40 mg total) by mouth daily. 90 tablet 3  . gabapentin (NEURONTIN) 300 MG capsule Take 300 mg by mouth 3 (three) times daily.    . isosorbide-hydrALAZINE (BIDIL) 20-37.5 MG tablet Take 2 tablets by mouth 2 (two) times daily. 120 tablet 11  . omeprazole (PRILOSEC) 40 MG capsule Take 40 mg by mouth daily.  0  . sacubitril-valsartan (ENTRESTO) 97-103 MG Take 1 tablet by mouth 2 (two) times daily. 60 tablet 3  . spironolactone (ALDACTONE) 25 MG tablet Take 1 tablet (25 mg total) by mouth daily. 30 tablet 3    Drug Regimen Review Drug regimen was reviewed and remains appropriate with no significant issues identified  Home: Home Living Family/patient  expects to be discharged to:: Private residence Living Arrangements: Alone, Children Available Help at Discharge: Family, Available 24 hours/day Type of Home: Apartment Home Access: Level entry Home Layout: One level Bathroom Shower/Tub: Multimedia programmer: Standard Home Equipment: Environmental consultant - 2 wheels, Cane - single point, Bedside commode   Functional History: Prior Function Level of Independence: Needs assistance Gait / Transfers Assistance Needed: Uses RW for ambulation; has needed more help recently due to weakness. ADL's / Homemaking Assistance Needed: Assist with bathing from daughter. Comments: Has been staying with daughter for the last week.  Functional Status:  Mobility: Bed Mobility Overal bed mobility: Needs Assistance Bed Mobility: Rolling, Sidelying to Sit Rolling: Min guard Sidelying to sit: Min assist, HOB elevated General bed mobility comments: Increased time to get to EOB with use of rail and assist to elevate trunk as pt not able to push through RUE.  Transfers Overall transfer  level: Needs assistance Equipment used: Rolling walker (2 wheeled) Transfers: Sit to/from Stand, [employer] Transfers Sit to Stand: Mod assist, +2 safety/equipment Stand pivot transfers: Mod assist, +2 physical assistance General transfer comment: Assist to power to standing wtih cues for hand placement, despite pulling up on RW. Stood from EOB x1, from [facility] x1, SPT bed to/from [facility] and bed to chair with mod A for balance, RW sequencing. Difficulty advancing LLE and WB through RLE. Increased knee flexion bilaterally.  ADL: ADL Overall ADL's : Needs assistance/impaired Eating/Feeding: Set up, Sitting Grooming: Set up, Sitting Upper Body Bathing: Set up, Sitting, Minimal assistance Lower Body Bathing: Maximal assistance, Sit to/from stand, +2 for safety/equipment Upper Body Dressing : Minimal assistance, Set up, Sitting Lower Body Dressing: Maximal assistance, +2 for  safety/equipment, Sit to/from stand Toilet Transfer: Moderate assistance, +2 for physical assistance, Stand-pivot, BSC, RW Toileting- Clothing Manipulation and Hygiene: Moderate assistance, +2 for safety/equipment, Sit to/from stand Tub/ Shower Transfer: Moderate assistance, Stand-pivot, +2 for physical assistance General ADL Comments: Pt completed bed mobility, SPT EOB>BSC>EOB>recliner.   Cognition: Cognition Overall Cognitive Status: No family/caregiver present to determine baseline cognitive functioning Orientation Level: Oriented to person, Oriented to place, Oriented to time Attention: Sustained Sustained Attention: Appears intact Cognition Arousal/Alertness: Awake/alert Behavior During Therapy: WFL for tasks assessed/performed Overall Cognitive Status: No family/caregiver present to determine baseline cognitive functioning General Comments: A&Ox4. Not formally assessed.  Physical Exam: Blood pressure (!) 104/58, pulse 81, temperature (!) 97.5 F (36.4 C), temperature source Oral, resp. rate 18, weight 99 kg (218 lb 4.1 oz), SpO2 100 %. Physical Exam Constitutional: No distress . Vital signs reviewed. obese HEENT: EOMI, oral membranes moist Cardiovascular: RRR without murmur. No JVD    Respiratory: CTA Bilaterally without wheezes or rales. Normal effort    GI: BS +, non-tender, non-distended  Skin. Warm and dry Neurological: She isalertand oriented to person, place, and time. Speech dysarthric. Right central 7. Reasonable insight and awareness. Language normal. RUE grossly 4/5 prox to distal with +PD. RLE: 2-/5 prox to 1+ ADF/PF. LUE  grossly 4+ to 5/5. LLE 3+/5 prox to 4/5 distally.  No sensory findings. No resting tone.  Psych: pleasant and appropriate  LabResultsLast48Hours        Results for orders placed or performed during the hospital encounter of 04/19/17 (from the past 48 hour(s))  Basic metabolic panel     Status: Abnormal   Collection Time: 04/20/17   8:07 AM  Result Value Ref Range   Sodium 137 135 - 145 mmol/L   Potassium 5.1 3.5 - 5.1 mmol/L   Chloride 105 101 - 111 mmol/L   CO2 21 (L) 22 - 32 mmol/L   Glucose, Bld 103 (H) 65 - 99 mg/dL   BUN 40 (H) 6 - 20 mg/dL   Creatinine, Ser 1.97 (H) 0.44 - 1.00 mg/dL   Calcium 10.0 8.9 - 10.3 mg/dL   GFR calc non Af Amer 23 (L) >60 mL/min   GFR calc Af Amer 27 (L) >60 mL/min    Comment: (NOTE) The eGFR has been calculated using the CKD EPI equation. This calculation has not been validated in all clinical situations. eGFR's persistently <60 mL/min signify possible Chronic Kidney Disease.    Anion gap 11 5 - 15    Comment: Performed at [facility] [address], [place], [place] [address]  CBC with Differential/Platelet     Status: Abnormal   Collection Time: 04/21/17  5:07 AM  Result Value Ref Range  WBC 3.6 (L) 4.0 - 10.5 K/uL   RBC 4.28 3.87 - 5.11 MIL/uL   Hemoglobin 12.6 12.0 - 15.0 g/dL   HCT 39.4 36.0 - 46.0 %   MCV 92.1 78.0 - 100.0 fL   MCH 29.4 26.0 - 34.0 pg   MCHC 32.0 30.0 - 36.0 g/dL   RDW 16.8 (H) 11.5 - 15.5 %   Platelets 144 (L) 150 - 400 K/uL   Neutrophils Relative % 51 %   Neutro Abs 1.8 1.7 - 7.7 K/uL   Lymphocytes Relative 32 %   Lymphs Abs 1.2 0.7 - 4.0 K/uL   Monocytes Relative 12 %   Monocytes Absolute 0.5 0.1 - 1.0 K/uL   Eosinophils Relative 4 %   Eosinophils Absolute 0.2 0.0 - 0.7 K/uL   Basophils Relative 1 %   Basophils Absolute 0.0 0.0 - 0.1 K/uL    Comment: Performed at [facility] [address], [place], [place] [address]  Basic metabolic panel     Status: Abnormal   Collection Time: 04/21/17  5:07 AM  Result Value Ref Range   Sodium 135 135 - 145 mmol/L   Potassium 4.5 3.5 - 5.1 mmol/L   Chloride 101 101 - 111 mmol/L   CO2 23 22 - 32 mmol/L   Glucose, Bld 99 65 - 99 mg/dL   BUN 39 (H) 6 - 20 mg/dL   Creatinine, Ser 1.74 (H) 0.44 - 1.00 mg/dL   Calcium 10.2 8.9 - 10.3 mg/dL     GFR calc non Af Amer 27 (L) >60 mL/min   GFR calc Af Amer 31 (L) >60 mL/min    Comment: (NOTE) The eGFR has been calculated using the CKD EPI equation. This calculation has not been validated in all clinical situations. eGFR's persistently <60 mL/min signify possible Chronic Kidney Disease.    Anion gap 11 5 - 15    Comment: Performed at [facility] [address] [place], [place] [address]  Magnesium     Status: None   Collection Time: 04/21/17  5:07 AM  Result Value Ref Range   Magnesium 1.7 1.7 - 2.4 mg/dL    Comment: Performed at [facility] [address], [place], [place] [address]      ImagingResults(Last48hours)  Mr Virgel Paling GB Contrast  Result Date: 04/19/2017 CLINICAL DATA:  77 year old female with progressive right lower extremity weakness. Query recent stroke. EXAM: MRI HEAD WITHOUT CONTRAST MRA HEAD WITHOUT CONTRAST TECHNIQUE: Multiplanar, multiecho pulse sequences of the brain and surrounding structures were obtained without intravenous contrast. Angiographic images of the head were obtained using MRA technique without contrast. COMPARISON:  Head CT without contrast 04/18/2017. Lumbar spine MRI today reported separately. FINDINGS: MRI HEAD FINDINGS Brain: There is an oval 12 millimeter area of restricted diffusion involving the posterior lamina of the left internal capsule on series 3, image 25. there is associated T2 and FLAIR hyperintensity with mild T1 hypointensity. No associated hemorrhage or mass effect. No other diffusion restriction. Numerous chronic small or lacunar infarcts are scattered in both cerebellar hemispheres, the pons, the bilateral deep gray matter nuclei, and the cerebral white matter including the body of the corpus callosum. No cortical encephalomalacia is identified. There are occasional chronic micro hemorrhages, including in the right thalamus and cerebellum. No midline shift, mass effect, evidence of mass lesion,  ventriculomegaly, extra-axial collection or acute intracranial hemorrhage. Cervicomedullary junction and pituitary are within normal limits. Vascular: Major intracranial vascular flow voids are preserved. See  intracranial MRA findings below. Skull and upper cervical spine: Negative visible cervical spine. Normal bone marrow signal. Hyperostosis of the calvarium, normal variant. Sinuses/Orbits: Postoperative changes to the globes, otherwise normal orbits soft tissues. Paranasal sinuses and mastoids are stable and well pneumatized. Other: Visible internal auditory structures appear normal. Scalp and face soft tissues appear negative. MRA HEAD FINDINGS Tortuosity of the distal vertebral arteries likely in part responsible for apparent decreased flow signal in the proximal V4 segments. However, the left V4 segment may be stenotic. The right V4 is dominant with no stenosis suspected. Patent basilar artery with mild irregularity and stenosis. Dominant AICAs suspected. Normal SCA origins. Fetal type PCA origins. Mildly irregular bilateral PCA branches. On the source images distal PCA flow signal appears symmetric. Antegrade flow signal in both ICA siphons. No siphon stenosis identified, but there is short segment of fusiform aneurysmal enlargement of the left cavernous ICA up to 8 mm diameter (series 4, image 57). The bilateral ophthalmic and posterior communicating artery origins are normal. Patent carotid termini. Dominant left ACA A1 segment. The right is mildly irregular and stenotic. Anterior communicating artery and ACA A2 segments appear normal. MCA M1 segments and MCA bifurcations are patent with mild irregularity but no significant stenosis. Visible MCA branches are patent with mild irregularity. IMPRESSION: 1. Acute to early subacute lacunar infarct in the posterior limb of left internal capsule. No associated hemorrhage or mass effect. 2. Severe underlying chronic small vessel disease. Chronic lacunar infarcts  in the bilateral deep gray matter nuclei, brainstem, cerebellum, and cerebral white matter. 3. 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 4. Intracranial atherosclerosis. Hemodynamically significant stenosis is suspected only in the distal left vertebral artery. Electronically Signed   By: Genevie Ann M.D.   On: 04/19/2017 15:25   Mr Brain Wo Contrast  Result Date: 04/19/2017 CLINICAL DATA:  77 year old female with progressive right lower extremity weakness. Query recent stroke. EXAM: MRI HEAD WITHOUT CONTRAST MRA HEAD WITHOUT CONTRAST TECHNIQUE: Multiplanar, multiecho pulse sequences of the brain and surrounding structures were obtained without intravenous contrast. Angiographic images of the head were obtained using MRA technique without contrast. COMPARISON:  Head CT without contrast 04/18/2017. Lumbar spine MRI today reported separately. FINDINGS: MRI HEAD FINDINGS Brain: There is an oval 12 millimeter area of restricted diffusion involving the posterior lamina of the left internal capsule on series 3, image 25. there is associated T2 and FLAIR hyperintensity with mild T1 hypointensity. No associated hemorrhage or mass effect. No other diffusion restriction. Numerous chronic small or lacunar infarcts are scattered in both cerebellar hemispheres, the pons, the bilateral deep gray matter nuclei, and the cerebral white matter including the body of the corpus callosum. No cortical encephalomalacia is identified. There are occasional chronic micro hemorrhages, including in the right thalamus and cerebellum. No midline shift, mass effect, evidence of mass lesion, ventriculomegaly, extra-axial collection or acute intracranial hemorrhage. Cervicomedullary junction and pituitary are within normal limits. Vascular: Major intracranial vascular flow voids are preserved. See intracranial MRA findings below. Skull and upper cervical spine: Negative visible cervical spine. Normal bone marrow  signal. Hyperostosis of the calvarium, normal variant. Sinuses/Orbits: Postoperative changes to the globes, otherwise normal orbits soft tissues. Paranasal sinuses and mastoids are stable and well pneumatized. Other: Visible internal auditory structures appear normal. Scalp and face soft tissues appear negative. MRA HEAD FINDINGS Tortuosity of the distal vertebral arteries likely in part responsible for apparent decreased flow signal in the proximal V4 segments. However, the left  V4 segment may be stenotic. The right V4 is dominant with no stenosis suspected. Patent basilar artery with mild irregularity and stenosis. Dominant AICAs suspected. Normal SCA origins. Fetal type PCA origins. Mildly irregular bilateral PCA branches. On the source images distal PCA flow signal appears symmetric. Antegrade flow signal in both ICA siphons. No siphon stenosis identified, but there is short segment of fusiform aneurysmal enlargement of the left cavernous ICA up to 8 mm diameter (series 4, image 57). The bilateral ophthalmic and posterior communicating artery origins are normal. Patent carotid termini. Dominant left ACA A1 segment. The right is mildly irregular and stenotic. Anterior communicating artery and ACA A2 segments appear normal. MCA M1 segments and MCA bifurcations are patent with mild irregularity but no significant stenosis. Visible MCA branches are patent with mild irregularity. IMPRESSION: 1. Acute to early subacute lacunar infarct in the posterior limb of left internal capsule. No associated hemorrhage or mass effect. 2. Severe underlying chronic small vessel disease. Chronic lacunar infarcts in the bilateral deep gray matter nuclei, brainstem, cerebellum, and cerebral white matter. 3. 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 4. Intracranial atherosclerosis. Hemodynamically significant stenosis is suspected only in the distal left vertebral artery. Electronically Signed   By: Genevie Ann M.D.   On: 04/19/2017 15:25   Mr Lumbar Spine Wo Contrast  Result Date: 04/19/2017 CLINICAL DATA:  approximately 1 month ago daughter noticed her mother requires more assistance moving her right leg while getting into the car. Last Thursday, daughter reports even worsening right leg weakness with patient being unable to get up from her bed without any support. EXAM: MRI LUMBAR SPINE WITHOUT CONTRAST TECHNIQUE: Multiplanar, multisequence MR imaging of the lumbar spine was performed. No intravenous contrast was administered. COMPARISON:  None. FINDINGS: Segmentation:  Standard. Alignment:  Physiologic. Vertebrae:  No fracture, evidence of discitis, or bone lesion. Conus medullaris and cauda equina: Conus extends to the L1 level. Conus and cauda equina appear normal. Paraspinal and other soft tissues: No acute paraspinal abnormality. Right renal cyst. Disc levels: Disc spaces: Disc desiccation at L3-4, L4-5 and L5-S1. T12-L1: No significant disc bulge. No evidence of neural foraminal stenosis. No central canal stenosis. L1-L2: No significant disc bulge. No evidence of neural foraminal stenosis. No central canal stenosis. L2-L3: No significant disc bulge. No evidence of neural foraminal stenosis. No central canal stenosis. L3-L4: Mild broad-based disc bulge eccentric towards the right. Moderate bilateral facet arthropathy. Mild spinal stenosis. No evidence of neural foraminal stenosis. L4-L5: Broad-based disc bulge with an annular fissure. Moderate bilateral facet arthropathy with ligamentum flavum infolding. Bilateral lateral recess stenosis. No evidence of neural foraminal stenosis. No central canal stenosis. L5-S1: Broad-based disc bulge. No evidence of neural foraminal stenosis. No central canal stenosis. IMPRESSION: 1. At L4-5 there is a broad-based disc bulge with an annular fissure. Moderate bilateral facet arthropathy with ligamentum flavum infolding. Bilateral lateral recess stenosis. 2. At L3-4  there is a mild broad-based disc bulge eccentric towards the right. Moderate bilateral facet arthropathy. Mild spinal stenosis. Electronically Signed   By: Kathreen Devoid   On: 04/19/2017 15:45   [place] Ekg Site Rite  Result Date: 04/19/2017 If Site Rite image not attached, placement could not be confirmed due to current cardiac rhythm.       Medical Problem List and Plan: 1.  Right side weakness and dysarthria secondary to lacunar infarction posterior limb of left internal capsule secondary to small vessel disease. Plan aspirin and Plavix 3  weeks then Plavix alone             -admit to inpatient rehab 2.  DVT Prophylaxis/Anticoagulation: SCDs. Monitor for any signs of DVT 3. Pain Management: Neurontin 300 mg 3 times a day, Tylenol as needed 4. Mood: Provide emotional support 5. Neuropsych: This patient is capable of making decisions on her own behalf. 6. Skin/Wound Care: Routine skin checks 7. Fluids/Electrolytes/Nutrition: Routine I&O's with follow-up chemistries 8. Chronic combined systolic and diastolic congestive heart failure. Lasix 40 mg daily held due to soft blood pressures and mild bump in creatinine. Resume as needed Monitor for any signs of fluid overload 9. Cardiomyopathy with AICD. Follow-up cardiology services             -follow daily weights 10. Hypertension. Coreg 25 mg twice a day,BIDIL 20-37.5 milligrams twice a day 11. Chronic right lower extremity edema.  Weight patient has directed. Resume Lasix 40 mg daily of blood pressure allows 12.CKD stage III. Baseline creatinine 1.69. 13. Hyperlipidemia. Lipitor 14. GERD. Protonix    Post Admission Physician Evaluation: 1. Functional deficits secondary  to left internal capsule infarct. 2. Patient is admitted to receive collaborative, interdisciplinary care between the physiatrist, rehab nursing staff, and therapy team. 3. Patient's level of medical complexity and substantial therapy needs in context of that medical  necessity cannot be provided at a lesser intensity of care such as a SNF. 4. Patient has experienced substantial functional loss from his/her baseline which was documented above under the "Functional History" and "Functional Status" headings.  Judging by the patient's diagnosis, physical exam, and functional history, the patient has potential for functional progress which will result in measurable gains while on inpatient rehab.  These gains will be of substantial and practical use upon discharge  in facilitating mobility and self-care at the household level. 5. Physiatrist will provide 24 hour management of medical needs as well as oversight of the therapy plan/treatment and provide guidance as appropriate regarding the interaction of the two. 6. The Preadmission Screening has been reviewed and patient status is unchanged unless otherwise stated above. 7. 24 hour rehab nursing will assist with bladder management, bowel management, safety, skin/wound care, disease management, medication administration, pain management and patient education  and help integrate therapy concepts, techniques,education, etc. 8. PT will assess and treat for/with: Lower extremity strength, range of motion, stamina, balance, functional mobility, safety, adaptive techniques and equipment, NMR, family education, .   Goals are: supervision to min assist. 9. OT will assess and treat for/with: ADL's, functional mobility, safety, upper extremity strength, adaptive techniques and equipment, NMR, family education.   Goals are: supervision to min assist. Therapy may proceed with showering this patient. 10. SLP will assess and treat for/with: speech, communication.  Goals are: mod I. 11. Case Management and Social Worker will assess and treat for psychological issues and discharge planning. 12. Team conference will be held weekly to assess progress toward goals and to determine barriers to discharge. 13. Patient will receive at least 3  hours of therapy per day at least 5 days per week. 14. ELOS: 14-17 days       15. Prognosis:  excellent     Meredith Staggers, MD, [facility] 04/22/2017  Lavon Paganini Celeste, PA-C 04/21/2017

## 2017-04-22 NOTE — Consult Note (Addendum)
   [facility] CM Inpatient Consult   04/22/2017  Tara Dunn 18-Oct-1940 [id number]    Spoke with inpatient RNCM Vida Roller) to make aware [place] [facility] is active.   Please see chart review tab then encounters for patient outreach details.  Spoke with [name] at bedside. She reports the discharge plan is for inpatient rehab. Made [name] aware that [place] [facility] will continue to follow.  Confirmed disposition plans with inpatient RNCM. They are awaiting insurance authorization.  Will touch base with [name] regarding patient.  Marthenia Rolling, MSN-Ed, RN,BSN [facility] Liaison [phone]

## 2017-04-23 ENCOUNTER — Inpatient Hospital Stay (HOSPITAL_COMMUNITY): Payer: PPO | Admitting: Speech Pathology

## 2017-04-23 ENCOUNTER — Inpatient Hospital Stay (HOSPITAL_COMMUNITY): Payer: PPO | Admitting: Occupational Therapy

## 2017-04-23 ENCOUNTER — Inpatient Hospital Stay (HOSPITAL_COMMUNITY): Payer: PPO | Admitting: Physical Therapy

## 2017-04-23 NOTE — Plan of Care (Signed)
  Progressing Spiritual Needs Ability to function at adequate level 04/23/2017 1426 - Progressing by Claude Manges, LPN RH BOWEL ELIMINATION RH STG MANAGE BOWEL WITH ASSISTANCE Description STG Manage Bowel with Assistance. 04/23/2017 1426 - Progressing by Claude Manges, LPN RH STG MANAGE BOWEL W/MEDICATION W/ASSISTANCE Description STG Manage Bowel with Medication with Assistance. 04/23/2017 1426 - Progressing by Claude Manges, LPN RH STG MANAGE BOWEL W/EQUIPMENT W/ASSISTANCE Description STG Manage Bowel With Equipment With Assistance 04/23/2017 1426 - Progressing by Claude Manges, LPN RH OTHER STG BOWEL ELIMINATION GOALS W/ASSIST Description Other STG Bowel Elimination Goals With Assistance. 04/23/2017 1426 - Progressing by Claude Manges, LPN RH BLADDER ELIMINATION RH STG MANAGE BLADDER WITH ASSISTANCE Description STG Manage Bladder With Assistance 04/23/2017 1426 - Progressing by Claude Manges, LPN [facility] SKIN INTEGRITY RH STG SKIN FREE OF INFECTION/BREAKDOWN 04/23/2017 1426 - Progressing by Claude Manges, LPN RH STG MAINTAIN SKIN INTEGRITY WITH ASSISTANCE Description STG Maintain Skin Integrity With Assistance. 04/23/2017 1426 - Progressing by Claude Manges, LPN Consults [facility] STROKE PATIENT EDUCATION Description See Patient Education module for education specifics  04/23/2017 1426 - Progressing by Claude Manges, LPN

## 2017-04-23 NOTE — Plan of Care (Signed)
LTGs established 04/23/17

## 2017-04-23 NOTE — Plan of Care (Signed)
  RH SKIN INTEGRITY RH STG SKIN FREE OF INFECTION/BREAKDOWN Description Skin free of breakdown with min assist  04/23/2017 2252 - Progressing by Lysbeth Penner D, RN  Skin remain intact, Cleanse and keep dry after each incont episodes.

## 2017-04-23 NOTE — Progress Notes (Signed)
Patient is refusing the use of CPAP at this time. RT informed patient if she changes her mind have RN contact RT. 

## 2017-04-23 NOTE — Progress Notes (Signed)
[place] [facility]     PROGRESS NOTE    Subjective/Complaints: Patient states that she had a good night.  Therapist thought she may have been wheezing a bit when she first got up.  ROS: Patient denies fever, rash, sore throat, blurred vision, nausea, vomiting, diarrhea, cough, shortness of breath or chest pain, joint or back pain, headache, or mood change.   Objective: Vital Signs: Blood pressure 119/66, pulse 75, temperature 97.6 F (36.4 C), temperature source Oral, resp. rate 16, height 5\' 3"  (1.6 m), weight 98.7 kg (217 lb 9.5 oz), SpO2 100 %. No results found. Recent Labs    04/21/17 0507  WBC 3.6*  HGB 12.6  HCT 39.4  PLT 144*   Recent Labs    04/21/17 0507 04/22/17 0429  NA 135 136  K 4.5 4.8  CL 101 103  GLUCOSE 99 116*  BUN 39* 43*  CREATININE 1.74* 2.20*  CALCIUM 10.2 10.0   CBG (last 3)  No results for input(s): GLUCAP in the last 72 hours.  Wt Readings from Last 3 Encounters:  04/23/17 98.7 kg (217 lb 9.5 oz)  04/21/17 99 kg (218 lb 4.1 oz)  04/15/17 100.2 kg (221 lb)    Physical Exam:  Constitutional: No distress . Vital signs reviewed. obese HEENT: EOMI, oral membranes moist Cardiovascular: RRR without murmur. No JVD    Respiratory: CTA Bilaterally without wheezes or rales. Normal effort    GI: BS +, non-tender, non-distended  Skin. Warm and dry Neurological: She isalertand oriented to person, place, and time.  .  Right central 7 with dysarthria reasonable insight and awareness.  Follows basic commands.  Higher level processing delays.  Language normal. RUE grossly 4/5 prox to distal with +PD. RLE: 2-/5 prox to 1+ ADF/PF. LUE grossly 4+ to 5/5. LLE 3+/5 prox to 4/5 distally--motor exam unchanged today. No sensory findings. No resting tone.  Psych: Pleasant and cooperative    Assessment/Plan: 1.  Functional and mobility deficits secondary to left posterior limb internal capsule infarct which require 3+ hours per  day of interdisciplinary therapy in a comprehensive inpatient rehab setting. Physiatrist is providing close team supervision and 24 hour management of active medical problems listed below. Physiatrist and rehab team continue to assess barriers to discharge/monitor patient progress toward functional and medical goals.  Function:  Bathing Bathing position      Bathing parts      Bathing assist        Upper Body Dressing/Undressing Upper body dressing                    Upper body assist        Lower Body Dressing/Undressing Lower body dressing                                  Lower body assist        Toileting Toileting Toileting activity did not occur: Safety/medical concerns   Toileting steps completed by helper: Adjust clothing prior to toileting, Performs perineal hygiene, Adjust clothing after toileting Toileting Assistive Devices: Toilet aid  Toileting assist Assist level: More than reasonable time   Transfers Chair/bed Clinical biochemist          Cognition Comprehension Comprehension assist level: Understands basic 75 - 89%  of the time/ requires cueing 10 - 24% of the time  Expression Expression assist level: Expresses basic 75 - 89% of the time/requires cueing 10 - 24% of the time. Needs helper to occlude trach/needs to repeat words.  Social Interaction Social Interaction assist level: Interacts appropriately 75 - 89% of the time - Needs redirection for appropriate language or to initiate interaction.  Problem Solving Problem solving assist level: Solves basic 75 - 89% of the time/requires cueing 10 - 24% of the time  Memory Memory assist level: Recognizes or recalls 75 - 89% of the time/requires cueing 10 - 24% of the time  Medical Problem List and Plan: 1.Right side weakness and dysarthriasecondary to lacunar infarction posterior limb ofleftinternal capsule secondary to small vessel  disease. Plan aspirin and Plavix 3 weeks then Plavix alone -Beginning therapies today 2. DVT Prophylaxis/Anticoagulation: SCDs. Monitor for any signs of DVT 3. Pain Management:Neurontin 300 mg 3 times a day, Tylenol as needed 4. Mood:Provide emotional support 5. Neuropsych: This patientiscapable of making decisions on herown behalf. 6. Skin/Wound Care:Routine skin checks 7. Fluids/Electrolytes/Nutrition:Routine I&O's with follow-up chemistries 8.Chronic combined systolic and diastolic congestive heart failure.Lasix 40 mg daily held due to soft blood pressures and mild bump in creatinine. Resume as neededMonitor for any signs of fluid overload   - Filed Weights   04/22/17 1700 04/23/17 0312  Weight: 98.9 kg (218 lb 0.6 oz) 98.7 kg (217 lb 9.5 oz)    9.Cardiomyopathy with AICD. Follow-up cardiology services -follow daily weights as above 10.Hypertension. Coreg 25 mg twice a day,BIDIL20-37.88milligrams twice a day 11.Chronic right lower extremity edema. Weight patient has directed.Resume Lasix 40 mg daily of blood pressure allows 12.CKDstage III. Baseline creatinine 1.69. 13.Hyperlipidemia. Lipitor 14.GERD. Protonix    LOS (Days) 1 A FACE TO FACE EVALUATION WAS PERFORMED  Meredith Staggers, MD 04/23/2017 8:10 AM

## 2017-04-23 NOTE — Evaluation (Signed)
Speech Language Pathology Assessment and Plan  Patient Details  Name: Tara Dunn MRN: [id number] Date of Birth: 1940/03/30  SLP Diagnosis: Dysarthria;Speech and Language deficits;Cognitive Impairments  Rehab Potential: Good ELOS: 14-18 days     Today's Date: 04/23/2017 SLP Individual Time: 3244-0102 SLP Individual Time Calculation (min): 60 min   Problem List:  Patient Active Problem List   Diagnosis Date Noted  . AKI (acute kidney injury) ([place])   . Peripheral edema   . Stage 3 chronic kidney disease ([place])   . Benign essential HTN   . History of CVA with residual deficit   . Cerebrovascular accident (CVA) due to thrombosis of left middle cerebral artery ([place])   . Right leg weakness 04/19/2017  . Right hemiparesis ([place]) 04/19/2017  . 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 Joint pain 12/09/2015  . ICD (implantable cardioverter-defibrillator) in place 03/26/2015  . Syncope 03/06/2015  . Obesity (BMI 30-39.9) 01/21/2015  . Excessive daytime sleepiness 08/04/2014  . OSA (obstructive sleep apnea) 08/04/2014  . Essential hypertension 06/13/2014  . Chronic systolic CHF (congestive heart failure) ([place]) 05/20/2014  . Congestive heart disease ([place])   . Chronic combined systolic and diastolic CHF (congestive heart failure) ([place]) 05/03/2014  . CKD (chronic kidney disease), stage III ([place]) 05/03/2014  . Hx of stroke without residual deficits 05/03/2014   Past Medical History:  Past Medical History:  Diagnosis Date  . 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 Lovena Le  . Aortic dilatation ([place])    a. 04/2014 CTA chest w/ incidental finding of distal thoracic Ao enlargement of 3.39 mm - f/u needed 04/2015.  . Arthritis    "all over my body"  . Cancer of left breast ([place]) 2009   s/p L mastectomy and chemo.  . Cardiomyopathy ([place])    a. 04/2014 Echo: EF 25-30%, mod conc LVH, possibl antsept HK, Gr1 DD, mod-sev dil LA.  . CHF (congestive heart  failure) ([place])   . CKD (chronic kidney disease), stage III ([place])   . Depression   . GERD (gastroesophageal reflux disease)   . Gout   . Heart murmur   . History of stomach ulcers   . Hypertension    a. Dx @ age 62;  b. 04/2014 admission for HTN emergency.  . Obesity (BMI 30-39.9) 01/21/2015  . OSA on CPAP   . Stroke [facility])    a. 3 strokes - last in 1998; "memory problems since"  (03/26/2015)   Past Surgical History:  Past Surgical History:  Procedure Laterality Date  . ABDOMINAL HYSTERECTOMY    . BREAST BIOPSY Left 2008  . CATARACT EXTRACTION, BILATERAL Bilateral   . DILATION AND CURETTAGE OF UTERUS    . EP IMPLANTABLE DEVICE N/A 03/26/2015   MDT single chamber ICD, Dr. Lovena Le  . LEFT HEART CATHETERIZATION WITH CORONARY ANGIOGRAM N/A 05/07/2014   Procedure: LEFT HEART CATHETERIZATION WITH CORONARY ANGIOGRAM;  Surgeon: Belva Crome, MD;  Location: [facility] CATH LAB;  Service: Cardiovascular;  Laterality: N/A;  . MASTECTOMY Left 2009    Assessment / Plan / Recommendation Clinical Impression Patient is a 77 y.o.right handed femalewithhistory of left breast cancer with mastectomy chemotherapy 2009,chronic right lower extremity edema,cardiomyopathy with AICD placement 2017 per Dr. Demetria Pore of chronic combined systolic and diastolic congestive heart failure, chronic kidney disease stage III, hypertension and CVA3 with minimal left side weakness andmaintained on aspirin 81 mg daily. Per chart review and patient,patient lives alone however she has been staying with her daughter for the  past week she was needing more assistance with mobility using a rolling walker for ambulation.She states she needed assistance wiht all ADLs PTA. One level home. Presented 04/19/2017 with right-sided weakness as well as slurred speech. Creatinine 1.69, EKG sinus rhythm with first degree AV nodal block.. Troponin negative.CT reviewe, unremarkable for acute process.MRI as well as MRA showed acute early  subacute lacunar infarct posterior limb of left internal capsule. No associated hemorrhage or mass effect. 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 MRI lumbar spine showed L3-4 and L4-5 broad-based disc bulge with annular fissure. Mild spinal stenosis. Patient did not receive TPA.AICD was interrogated with no findings.Echocardiogram with ejection fraction of 515%grade 1 diastolic dysfunction. Neurology consulted presently on aspirin dailyand Plavixfor CVA prophylaxis3 weeks then Plavix alone.Blood pressures a bit softwithLasix andEntrestoheld and resume as tolerated.Tolerating a regular diet. Physicalandoccupational therapy evaluations completed 04/20/2017 with recommendations of physical medicine rehabilitation [facility]  Patient demonstrates severe cognitive impairments, however, family not present to confirm baseline level of cognitive-lingustic function. Patient was administered the MoCA (version 7.1). Patient scored 11/30 points with a score of 26 or above considered normal. Patient demonstrates impairments in attention, problem solving, recall, executive functioning, visuospatial skills, awareness and overall safety due to impulsivity. Patient  demonstrates a moderate dysarthria due to imprecise consonants and an increased speech rate impacting her overall intelligibility at the phrase level.  Patient also demonstrates mild-moderate higher-level language impairment impacting generative and divergent naming and appeared echolalic at times. Patient would benefit from skilled SLP intervention to maximize her cognitive-linguistic function and overall functional independence prior to discharge.     Skilled Therapeutic Interventions          Patient administered a cognitive-linguistic evaluation. Please see above for details. Educated patient in regards to her current cognitive-linguistic impairments and goals of skilled SLP  intervention. She verbalized understanding and agreement.   SLP Assessment  Patient will need skilled [facility]manPathology Services during CIR admission    Recommendations  Oral Care Recommendations: Oral care BID Recommendations for Other Services: Neuropsych consult Patient destination: Home Follow up Recommendations: Home Health SLP;24 hour supervision/assistance;Outpatient SLP Equipment Recommended: None recommended by SLP    SLP Frequency 3 to 5 out of 7 days   SLP Duration  SLP Intensity  SLP Treatment/Interventions 14-18 days   Minumum of 1-2 x/day, 30 to 90 minutes  Cognitive remediation/compensation;Environmental controls;Internal/external aids;Speech/Language facilitation;Therapeutic Activities;Patient/family education;Functional tasks;Cueing hierarchy    Pain No/Denies Pain   Prior Functioning Type of Home: Apartment  Lives With: Alone Available Help at Discharge: Family;Available 24 hours/day  Function:   Cognition Comprehension Comprehension assist level: Understands basic 75 - 89% of the time/ requires cueing 10 - 24% of the time  Expression   Expression assist level: Expresses basic 50 - 74% of the time/requires cueing 25 - 49% of the time. Needs to repeat parts of sentences.  Social Interaction Social Interaction assist level: Interacts appropriately 50 - 74% of the time - May be physically or verbally inappropriate.  Problem Solving Problem solving assist level: Solves basic 25 - 49% of the time - needs direction more than half the time to initiate, plan or complete simple activities  Memory Memory assist level: Recognizes or recalls 25 - 49% of the time/requires cueing 50 - 75% of the time   Short Term Goals: Week 1: SLP Short Term Goal 1 (Week 1): Patient will utilize a slow rate and over-articulation at the sentence level to  achieve ~90% intelligibility with Min A verbal cues.  SLP Short Term Goal 2 (Week 1): Patient will complete higher-level  naming/word-finding tasks with Min A multimodal cues.  SLP Short Term Goal 3 (Week 1): Patient will demonstrate functional problem solving for basic and familiar tasks with Mod A verbal cues.  SLP Short Term Goal 4 (Week 1): Patient will identify 2 physical and 2 cognitive deficits with Mod A verbal cues.  SLP Short Term Goal 5 (Week 1): Patient will utilize external memory aids to recall new, daily information with Mod A verbal and visual cues.  SLP Short Term Goal 6 (Week 1): Patient will demonstrate selective attention to functional tasks in a mildly distracting enviornment for ~30 minutes with supervision verbal cues for redirection.   Refer to Care Plan for Long Term Goals  Recommendations for other services: Neuropsych  Discharge Criteria: Patient will be discharged from SLP if patient refuses treatment 3 consecutive times without medical reason, if treatment goals not met, if there is a change in medical status, if patient makes no progress towards goals or if patient is discharged from hospital.  The above assessment, treatment plan, treatment alternatives and goals were discussed and mutually agreed upon: by patient  Raneshia Derick 04/23/2017, 3:15 PM

## 2017-04-23 NOTE — Evaluation (Addendum)
Occupational Therapy Assessment and Plan  Patient Details  Name: Tara Dunn MRN: [id number] Date of Birth: 03-28-1940  OT Diagnosis: abnormal posture, cognitive deficits, hemiplegia affecting dominant side, muscle weakness (generalized), other lymphedema and swelling of limb Rehab Potential: Rehab Potential (ACUTE ONLY): Good ELOS: 16-18 days   Today's Date: 04/23/2017 OT Individual Time: 5329-9242 OT Individual Time Calculation (min): 76 min     Problem List:  Patient Active Problem List   Diagnosis Date Noted  . AKI (acute kidney injury) ([place])   . Peripheral edema   . Stage 3 chronic kidney disease ([place])   . Benign essential HTN   . History of CVA with residual deficit   . Cerebrovascular accident (CVA) due to thrombosis of left middle cerebral artery ([place])   . Right leg weakness 04/19/2017  . Right hemiparesis ([place]) 04/19/2017  . Thalamic infarct, acute ([place]) 04/19/2017  . Joint pain 12/09/2015  . ICD (implantable cardioverter-defibrillator) in place 03/26/2015  . Syncope 03/06/2015  . Obesity (BMI 30-39.9) 01/21/2015  . Excessive daytime sleepiness 08/04/2014  . OSA (obstructive sleep apnea) 08/04/2014  . Essential hypertension 06/13/2014  . Chronic systolic CHF (congestive heart failure) ([place]) 05/20/2014  . Congestive heart disease ([place])   . Chronic combined systolic and diastolic CHF (congestive heart failure) ([place]) 05/03/2014  . CKD (chronic kidney disease), stage III ([place]) 05/03/2014  . Hx of stroke without residual deficits 05/03/2014    Past Medical History:  Past Medical History:  Diagnosis Date  . 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 Lovena Le  . Aortic dilatation ([place])    a. 04/2014 CTA chest w/ incidental finding of distal thoracic Ao enlargement of 3.39 mm - f/u needed 04/2015.  . Arthritis    "all over my body"  . Cancer of left breast ([place]) 2009   s/p L mastectomy and chemo.  . Cardiomyopathy ([place])    a. 04/2014  Echo: EF 25-30%, mod conc LVH, possibl antsept HK, Gr1 DD, mod-sev dil LA.  . CHF (congestive heart failure) ([place])   . CKD (chronic kidney disease), stage III ([place])   . Depression   . GERD (gastroesophageal reflux disease)   . Gout   . Heart murmur   . History of stomach ulcers   . Hypertension    a. Dx @ age 15;  b. 04/2014 admission for HTN emergency.  . Obesity (BMI 30-39.9) 01/21/2015  . OSA on CPAP   . Stroke [facility])    a. 3 strokes - last in 1998; "memory problems since"  (03/26/2015)   Past Surgical History:  Past Surgical History:  Procedure Laterality Date  . ABDOMINAL HYSTERECTOMY    . BREAST BIOPSY Left 2008  . CATARACT EXTRACTION, BILATERAL Bilateral   . DILATION AND CURETTAGE OF UTERUS    . EP IMPLANTABLE DEVICE N/A 03/26/2015   MDT single chamber ICD, Dr. Lovena Le  . LEFT HEART CATHETERIZATION WITH CORONARY ANGIOGRAM N/A 05/07/2014   Procedure: LEFT HEART CATHETERIZATION WITH CORONARY ANGIOGRAM;  Surgeon: Belva Crome, MD;  [facility] CATH LAB;  Service: Cardiovascular;  Laterality: N/A;  . MASTECTOMY Left 2009    Assessment & Plan Clinical Impression: Tara Dunn a 77 y.o.right handed femalewithhistory of left breast cancer with mastectomy chemotherapy 2009,chronic right lower extremity edema,cardiomyopathy with AICD placement 2017 per Dr. Demetria Pore of chronic combined systolic and diastolic congestive heart failure, chronic kidney disease stage III, hypertension and CVA3 with minimal left side weakness andmaintained on aspirin 81 mg daily. Per chart review and  patient,patient lives alone however she has been staying with her daughter for the past week she was needing more assistance with mobility using a rolling walker for ambulation.She states she needed assistance wiht all ADLs PTA. One level home. Presented 04/19/2017 with right-sided weakness as well as slurred speech. Creatinine 1.69, EKG sinus rhythm with first degree AV nodal block.. Troponin  negative.CT reviewe, unremarkable for acute process.MRI as well as MRA showed acute early subacute lacunar infarct posterior limb of left internal capsule. No associated hemorrhage or mass effect. 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 MRI lumbar spine showed L3-4 and L4-5 broad-based disc bulge with annular fissure. Mild spinal stenosis. Patient did not receive TPA.AICD was interrogated with no findings.Echocardiogram with ejection fraction of 519%grade 1 diastolic dysfunction. Neurology consulted presently on aspirin dailyand Plavixfor CVA prophylaxis3 weeks then Plavix alone.Blood pressures a bit softwithLasix andEntrestoheld and resume as tolerated.Tolerating a regular diet. Physicalandoccupational therapy evaluations completed 04/20/2017 with recommendations of physical medicine rehabilitation consultpatient was admitted for a comp rehabilitation program  Patient currently requires max with basic self-care skills secondary to muscle weakness, decreased cardiorespiratoy endurance, abnormal tone, unbalanced muscle activation and decreased coordination, decreased attention, decreased awareness, decreased problem solving, decreased safety awareness and decreased memory and decreased sitting balance, decreased standing balance, decreased postural control and hemiplegia.  Prior to hospitalization, patient could complete BADLs with modified independent .  Patient will benefit from skilled intervention to increase independence with basic self-care skills prior to discharge home with dtr FLetta Median  Anticipate patient will require 24 hour supervision, minimal physical assistance and moderate physical assestance and follow up home health.  OT - End of Session Endurance Deficit: Yes OT Assessment Rehab Potential (ACUTE ONLY): Good OT Barriers to Discharge: Decreased caregiver support OT Patient demonstrates impairments in the following area(s):  Balance;Cognition;Safety;Endurance;Motor OT Basic ADL's Functional Problem(s): Eating;Grooming;Bathing;Dressing;Toileting OT Advanced ADL's Functional Problem(s): Simple Meal Preparation OT Transfers Functional Problem(s): Toilet;Tub/Shower OT Additional Impairment(s): None OT Plan OT Intensity: Minimum of 1-2 x/day, 45 to 90 minutes OT Frequency: 5 out of 7 days OT Duration/Estimated Length of Stay: 16-18 days OT Treatment/Interventions: Balance/vestibular training;Discharge planning;Functional electrical stimulation;Pain management;Self Care/advanced ADL retraining;Therapeutic Activities;UE/LE Coordination activities;Visual/perceptual remediation/compensation;Therapeutic Exercise;Skin care/wound managment;Patient/family education;Functional mobility training;Disease mangement/prevention;Cognitive remediation/compensation;Community reintegration;DME/adaptive equipment instruction;Neuromuscular re-education;Psychosocial support;Splinting/orthotics;UE/LE Strength taining/ROM;Wheelchair propulsion/positioning OT Self Feeding Anticipated Outcome(s): Min A OT Basic Self-Care Anticipated Outcome(s): Min A OT Toileting Anticipated Outcome(s): Mod A OT Bathroom Transfers Anticipated Outcome(s): Min A OT Recommendation Recommendations for Other Services: Neuropsych consult Patient destination: Home Follow Up Recommendations: Home health OT Equipment Recommended: To be determined   Skilled Therapeutic Intervention Skilled OT session completed with focus on initial evaluation, education on OT role/POC, and establishment of patient-centered goals.   Pt greeted in w/c with dtr FLetta Medianpresent. No c/o pain and motivated to shower. Pt completed sit<stand in [place] Mod A, able to pull up with both arms. She transferred to bariatric BSC (on highest level), and bathed while seated with LEs propped up on adaptive footstool. Pt requiring overall Mod A for LB due to inability to safely bend forward or achieve  figure 4 position. Pt using R UE at dominant level without issue. She then dressed w/c level at sink. Sit<stand with Max A for OT to elevate pants over hips. Worked on midline orientation with use of mirror due to Rt lean at this time. She then completed grooming/oral care tasks w/c level, with increased time for meeting bilateral FM demands. Pt with difficultly following precise  instruction during informal assessments, often mixing up Lt/Rt sides, but did well following instruction during familiar functional tasks. At end of session pt was left with dtr present, safety belt fastened, and all needs within reach.   OT Evaluation Precautions/Restrictions  Precautions Precautions: Fall Precaution Comments: Rt hemi Restrictions Weight Bearing Restrictions: No General Chart Reviewed: Yes Family/Caregiver Present: Yes(Dtr Letta Median) Vital Signs Therapy Vitals Temp: 98 F (36.7 C) Temp Source: Oral Pulse Rate: 75 Resp: 18 BP: (!) 103/59 Patient Position (if appropriate): Sitting Oxygen Therapy SpO2: 100 % O2 Device: Room Air Pain No c/o pain during session    Home Living/Prior Functioning Home Living Available Help at Discharge: Family, Available 24 hours/day Type of Home: House Home Access: Side entry threshold Home Layout: One level Bathroom Shower/Tub: Multimedia programmer: Standard Bathroom Accessibility: Yes Additional Comments: Already has TTB  Lives With: Daughter(Going home with dtr at d/c) IADL History Homemaking Responsibilities: No(She will not while home with Letta Median) Mode of Transportation: Family(Faye assisted with IADL driving needs) Occupation: On disability Leisure and Hobbies: Watching TV Prior Function Level of Independence: Independent with basic ADLs, Requires assistive device for independence, Independent with gait, Independent with homemaking with wheelchair, Independent with homemaking with ambulation(about a month ago) Driving: No ADL ADL ADL  Comments: Please see functional navigator for ADL status Vision Baseline Vision/History: Wears glasses Wears Glasses: At all times Patient Visual Report: No change from baseline Vision Assessment?: Yes;Vision impaired- to be further tested in functional context(During informal assessment, pt unable to consistently track objects horizontally/vertically, and converge eyes. Question whether this was due to cognition rather than visual capabilities) Perception  Perception: Within Functional Limits Praxis Praxis: Intact Cognition Overall Cognitive Status: Impaired/Different from baseline Arousal/Alertness: Awake/alert Orientation Level: Person;Place;Situation Person: Oriented Place: Oriented Situation: Oriented Year: 2019(First 1920. Then when asked "are you sure?" Responded with 2019) Month: March Day of Week: Correct Memory: Impaired Attention: Sustained Sustained Attention: Appears intact Selective Attention: Impaired Awareness: Impaired Problem Solving: Impaired Safety/Judgment: Impaired Sensation Sensation Light Touch: Appears Intact Stereognosis: Not tested Hot/Cold: Appears Intact Proprioception: Impaired Detail Proprioception Impaired Details: Impaired RUE;Impaired LUE Coordination Gross Motor Movements are Fluid and Coordinated: No Fine Motor Movements are Fluid and Coordinated: No Coordination and Movement Description: Affected by hx CVAs and coordination/strength deficits bilaterally Rt>Lt Motor  Motor Motor: Hemiplegia Motor - Skilled Clinical Observations: R sided hemi-plegia + Rt lean when standing/sitting in [employer] Mobility  Transfers Transfers: Sit to Stand;Stand to Sit Sit to Stand: 1: +1 Total assist;Other/comment(from BSC with use of Stedy) Stand to Sit: 1: +1 Total assist;Other (comment)(To BSC with use of Stedy)  Trunk/Postural Assessment  Cervical Assessment Cervical Assessment: Exceptions to WFL(Forward head) Thoracic Assessment Thoracic Assessment:  Exceptions to WFL(Kypyhotic) Lumbar Assessment Lumbar Assessment: Exceptions to WFL(Increased lumbar lordosis) Postural Control Postural Control: Deficits on evaluation(Rt lean in standing/sitting)  Balance Balance Balance Assessed: Yes Dynamic Sitting Balance Dynamic Sitting - Level of Assistance: 5: Stand by assistance(Bathing on BSC in shower, no UE supported) Dynamic Standing Balance Dynamic Standing - Level of Assistance: 2: Max assist(While assisting with LB dressing when standing at sink) Extremity/Trunk Assessment RUE Assessment RUE Assessment: (3/5 MMT proximal to distal) LUE Assessment LUE Assessment: (3+/5 MMT proximal to distal)   See Function Navigator for Current Functional Status.   Refer to Care Plan for Long Term Goals  Recommendations for other services: Neuropsych   Discharge Criteria: Patient will be discharged from OT if patient refuses treatment 3 consecutive times without medical reason, if treatment goals not  met, if there is a change in medical status, if patient makes no progress towards goals or if patient is discharged from hospital.  The above assessment, treatment plan, treatment alternatives and goals were discussed and mutually agreed upon: by patient and by family  Skeet Simmer 04/23/2017, 5:27 PM

## 2017-04-23 NOTE — Evaluation (Signed)
Physical Therapy Assessment and Plan  Patient Details  Name: Tara Dunn MRN: [id number] Date of Birth: [birth year]  PT Diagnosis: Abnormality of gait, Cognitive deficits, Difficulty walking and Hemiparesis dominant Rehab Potential: Good ELOS: 14 to 18 days   Today's Date: 04/23/2017 PT Individual Time: 0904-1001 PT Individual Time Calculation (min): 57 min    Problem List:  Patient Active Problem List   Diagnosis Date Noted  . AKI (acute kidney injury) ([place])   . Peripheral edema   . Stage 3 chronic kidney disease ([place])   . Benign essential HTN   . History of CVA with residual deficit   . Cerebrovascular accident (CVA) due to thrombosis of left middle cerebral artery ([place])   . Right leg weakness 04/19/2017  . Right hemiparesis ([place]) 04/19/2017  . Thalamic infarct, acute ([place]) 04/19/2017  . Joint pain 12/09/2015  . ICD (implantable cardioverter-defibrillator) in place 03/26/2015  . Syncope 03/06/2015  . Obesity (BMI 30-39.9) 01/21/2015  . Excessive daytime sleepiness 08/04/2014  . OSA (obstructive sleep apnea) 08/04/2014  . Essential hypertension 06/13/2014  . Chronic systolic CHF (congestive heart failure) ([place]) 05/20/2014  . Congestive heart disease ([place])   . Chronic combined systolic and diastolic CHF (congestive heart failure) ([place]) 05/03/2014  . CKD (chronic kidney disease), stage III ([place]) 05/03/2014  . Hx of stroke without residual deficits 05/03/2014    Past Medical History:  Past Medical History:  Diagnosis Date  . 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 Lovena Le  . Aortic dilatation ([place])    a. 04/2014 CTA chest w/ incidental finding of distal thoracic Ao enlargement of 3.39 mm - f/u needed 04/2015.  . Arthritis    "all over my body"  . Cancer of left breast ([place]) 2009   s/p L mastectomy and chemo.  . Cardiomyopathy ([place])    a. 04/2014 Echo: EF 25-30%, mod conc LVH, possibl antsept HK, Gr1 DD, mod-sev dil LA.  . CHF  (congestive heart failure) ([place])   . CKD (chronic kidney disease), stage III ([place])   . Depression   . GERD (gastroesophageal reflux disease)   . Gout   . Heart murmur   . History of stomach ulcers   . Hypertension    a. Dx @ age 66;  b. 04/2014 admission for HTN emergency.  . Obesity (BMI 30-39.9) 01/21/2015  . OSA on CPAP   . Stroke [facility])    a. 3 strokes - last in 1998; "memory problems since"  (03/26/2015)   Past Surgical History:  Past Surgical History:  Procedure Laterality Date  . ABDOMINAL HYSTERECTOMY    . BREAST BIOPSY Left 2008  . CATARACT EXTRACTION, BILATERAL Bilateral   . DILATION AND CURETTAGE OF UTERUS    . EP IMPLANTABLE DEVICE N/A 03/26/2015   MDT single chamber ICD, Dr. Lovena Le  . LEFT HEART CATHETERIZATION WITH CORONARY ANGIOGRAM N/A 05/07/2014   Procedure: LEFT HEART CATHETERIZATION WITH CORONARY ANGIOGRAM;  Surgeon: Belva Crome, MD;  Location: [facility] CATH LAB;  Service: Cardiovascular;  Laterality: N/A;  . MASTECTOMY Left 2009    Assessment & Plan Clinical Impression: Patient is a 77 y.o. year old R handed female with recent admission to the hospital on 04-19-17 withhistory of left breast cancer with mastectomy chemotherapy 2009,chronic right lower extremity edema,cardiomyopathy with AICD placement 2017 per Dr. Demetria Pore of chronic combined systolic and diastolic congestive heart failure, chronic kidney disease stage III, hypertension and CVA3 with minimal left side weakness andmaintained on aspirin 81 mg daily. Per chart  review and patient,patient lives alone however she has been staying with her daughter for the past week she was needing more assistance with mobility using a rolling walker for ambulation.She states she needed assistance wiht all ADLs PTA. One level home. Presented 04/19/2017 with right-sided weakness as well as slurred speech. Creatinine 1.69, EKG sinus rhythm with first degree AV nodal block.. Troponin negative.CT reviewe, unremarkable  for acute process.MRI as well as MRA showed acute early subacute lacunar infarct posterior limb of left internal capsule. No associated hemorrhage or mass effect. 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 MRI lumbar spine showed L3-4 and L4-5 broad-based disc bulge with annular fissure. Mild spinal stenosis. Patient did not receive TPA.AICD was interrogated with no findings.Echocardiogram with ejection fraction of 522%grade 1 diastolic dysfunction. Neurology consulted presently on aspirin dailyand Plavixfor CVA prophylaxis3 weeks then Plavix alone.Blood pressures a bit softwithLasix andEntrestoheld and resume as tolerated.Tolerating a regular diet. Physicalandoccupational therapy evaluations completed 04/20/2017 with recommendations of physical medicine rehabilitation consultpatient was admitted for a comp rehabilitation program    .  Patient transferred to CIR on 04/22/2017 .   Patient currently requires total with mobility secondary to muscle weakness and muscle paralysis, decreased cardiorespiratoy endurance, impaired timing and sequencing, abnormal tone, unbalanced muscle activation, decreased coordination and decreased motor planning, decreased motor planning, decreased problem solving, decreased memory and delayed processing and decreased sitting balance, decreased standing balance, decreased postural control, hemiplegia and decreased balance strategies.  Prior to hospitalization, patient was modified independent  with mobility and [place]  Home access is  Level entry.  Patient will benefit from skilled PT intervention to maximize safe functional mobility, minimize fall risk and decrease caregiver burden for planned discharge home with 24 hour assist.  Anticipate patient [facility].  PT - End of Session Activity Tolerance: Tolerates 30+ min activity with multiple rests Endurance Deficit:  Yes Endurance Deficit Description: Requires frequent rest breaks due to fatigue. PT Assessment Rehab Potential (ACUTE/IP ONLY): Good PT Barriers to Discharge: Medical stability;Home environment access/layout PT Patient demonstrates impairments in the following area(s): Balance;Endurance;Motor;Pain;Perception;Safety PT Transfers Functional Problem(s): Bed Mobility;Bed to Chair PT Locomotion Functional Problem(s): Ambulation;Wheelchair Mobility;Stairs PT Plan PT Intensity: Minimum of 1-2 x/day ,45 to 90 minutes PT Duration Estimated Length of Stay: 14 to 18 days PT Treatment/Interventions: Ambulation/gait training;Discharge planning;Functional mobility training;Psychosocial support;Therapeutic Activities;Visual/perceptual remediation/compensation;Balance/vestibular training;Disease management/prevention;Neuromuscular re-education;Therapeutic Exercise;Wheelchair propulsion/positioning;Cognitive remediation/compensation;Pain management;UE/LE Strength taining/ROM;Community reintegration;Patient/family education;Stair training;UE/LE Coordination activities PT Transfers Anticipated Outcome(s): Min A PT Locomotion Anticipated Outcome(s): Min A PT Recommendation Follow Up Recommendations: Home health PT;24 hour supervision/assistance Patient destination: Home Equipment Recommended: To be determined  Skilled Therapeutic Intervention Session today focused on patient/daughter education and also improving functional mobility to decrease burden of care.  Vitals pre:  R forearm:  129/72; HR: 72 bpm;  Pt denies pain.  Pt worked on transferring to and from bed using stedy with min assist.  When attempting to stand from recliner chair (low surface), pt required +2 (mod A x 2) for get to standing.  Upon standing to transfer to w/c from recliner, pt required + 2 assist for safety.  One person facilitating weight shift and stabilizing R knee and then other helping to hold pt upright.  Following session today, pt left  up in chair with lap belt in place, needs met, and daughter in room.  PT Evaluation Precautions/Restrictions Precautions Precautions: Fall Precaution Comments: (Restricted L UE due to mastectomy)  Restrictions Weight Bearing Restrictions: No General Chart Reviewed: Yes Additional Pertinent History: h/o cancer Family/Caregiver Present: Yes(Daughter present at the end of the session.) Vital SignsTherapy Vitals Pulse Rate: 70 BP: 110/60 Patient Position (if appropriate): Sitting Pain Pain Assessment Pain Assessment: 0-10 Pain Score: 0-No pain Home Living/Prior Functioning Home Living Available Help at Discharge: Family;Available 24 hours/day Type of Home: Apartment Home Access: Level entry Home Layout: One level Additional Comments: (Pt plans to go home with her daughter who lives in a one story home with 2 steps with no rails to enter.)  Lives With: Alone Prior Function Level of Independence: Independent with homemaking with wheelchair Vision/Perception  Perception Perception: (When drawing clock, pt draws all numbers on R side of clock, however, does not appear to have left neglect)  Cognition Orientation Level: Oriented X4 Sustained Attention: Appears intact Memory: Impaired Memory Impairment: Decreased recall of new information Awareness: Impaired Awareness Impairment: Emergent impairment Problem Solving: Impaired Safety/Judgment: Impaired Sensation Sensation Light Touch: Appears Intact Coordination Gross Motor Movements are Fluid and Coordinated: No Coordination and Movement Description: Pt unable to move R LE in supine due to increased synergy pattern Motor  Motor Motor: Hemiplegia Motor - Skilled Clinical Observations: R sided hemi-plegia      Trunk/Postural Assessment  Cervical Assessment Cervical Assessment: Exceptions to WFL(Increased lordosis) Thoracic Assessment Thoracic Assessment: Exceptions to WFL(Increased kyphosis) Lumbar Assessment Lumbar  Assessment: Exceptions to WFL(Increased lumbar lordosis) Postural Control Postural Control: Deficits on evaluation(Poor anticipatory control in standing as well as poor reactive control in standing and fair to good APR in seated)  Balance Balance Balance Assessed: Yes Static Sitting Balance Static Sitting - Balance Support: Feet supported Static Sitting - Level of Assistance: 4: Min assist Static Standing Balance Static Standing - Balance Support: Bilateral upper extremity supported Static Standing - Level of Assistance: 1: +2 Total assist Static Standing - Comment/# of Minutes: With standing with walker, pt requires +2 assist due to poor R LE control.  With stedy, pt requires min A Extremity Assessment  RUE Assessment RUE Assessment: Exceptions to WFL(B UE AROM WFL, however, strength not tested due to h/o mastectomy) LUE Assessment LUE Assessment: Exceptions to WFL(B UE AROM WFL, however, strength not tested due to h/o mastectomy) RLE Assessment RLE Assessment: Exceptions to WFL(Increased extensor synergy in R LE limits ability to use R LE for rolling and supine to sit.  ) LLE Assessment LLE Assessment: Within Functional Limits   See Function Navigator for Current Functional Status.   Refer to Care Plan for Long Term Goals  Recommendations for other services: None   Discharge Criteria: Patient will be discharged from PT if patient refuses treatment 3 consecutive times without medical reason, if treatment goals not met, if there is a change in medical status, if patient makes no progress towards goals or if patient is discharged from hospital.  The above assessment, treatment plan, treatment alternatives and goals were discussed and mutually agreed upon: by patient and by family  Charday Capetillo Hilario Quarry 04/23/2017, 12:43 PM

## 2017-04-24 ENCOUNTER — Inpatient Hospital Stay (HOSPITAL_COMMUNITY): Payer: PPO | Admitting: Physical Therapy

## 2017-04-24 ENCOUNTER — Inpatient Hospital Stay (HOSPITAL_COMMUNITY): Payer: PPO | Admitting: Occupational Therapy

## 2017-04-24 MED ORDER — FUROSEMIDE 20 MG PO TABS
20.0000 mg | ORAL_TABLET | Freq: Every day | ORAL | Status: DC
  Administered 2017-04-24 – 2017-04-25 (×2): 20 mg via ORAL
  Filled 2017-04-24 (×4): qty 1

## 2017-04-24 NOTE — Progress Notes (Signed)
Occupational Therapy Session Note  Patient Details  Name: Tara Dunn MRN: [id number] Date of Birth: 1941-02-07  Today's Date: 04/24/2017 OT Individual Time: 0160-1093 OT Individual Time Calculation (min): 44 min    Short Term Goals: Week 1:  OT Short Term Goal 1 (Week 1): Pt will complete 1/3 components of donning pants OT Short Term Goal 2 (Week 1): Pt will complete 1 grooming task in standing to improve balance OT Short Term Goal 3 (Week 1): Pt will complete 1/3 components of toileting   Skilled Therapeutic Interventions/Progress Updates:    Pt greeted supine in bed. No c/o pain and agreeable to get dressed. Tx focus on balance, sit<stands, midline orientation, and activity tolerance. She transitioned EOB with Mod A and extra time/instruction. 1 small posterior LOB while donning overhead shirt with pt able to recover herself. She threaded bilateral LEs into pants(!) with close supervision and completed sit<stand from elevated bed with Mod A and RW. Pt standing for 5 minutes in order for OT to complete perihygiene, apply clean brief, and elevate tight pants over buttocks. Stand pivot<w/c completed with Max A and OT supporting Lt knee. Once seated, pt verbalized feeling exhausted, but agreeable to complete oral care/grooming tasks w/c level. Worked on active trunk control while weight shifting forward to reach for items on sink and expectorate as needed. Visual deficits in functional context noted during oral care, with pt verbalizing water was fully turned off when there was still a small stream coming from faucet. At end of session pt was left in w/c with safety belt fastened and RN present.   Therapy Documentation Precautions:  Precautions Precautions: Fall Precaution Comments: Rt hemi Restrictions Weight Bearing Restrictions: No Vital Signs: Therapy Vitals Temp: 98 F (36.7 C) Temp Source: Oral Pulse Rate: 82 Resp: 16 BP: (!) 108/54 Patient Position (if appropriate):  Lying Oxygen Therapy SpO2: 99 % O2 Device: Room Air Pain:   ADL: ADL ADL Comments: Please see functional navigator for ADL status     See Function Navigator for Current Functional Status.   Therapy/Group: Individual Therapy  Lashanna Angelo A Colbin Jovel 04/24/2017, 4:02 PM

## 2017-04-24 NOTE — Progress Notes (Signed)
[facility] PHYSICAL MEDICINE & REHABILITATION     PROGRESS NOTE    Subjective/Complaints: Patient states that she feels fairly well.  Was happy with her performance in therapy yesterday.  Did not sleep all that well last night.  ROS: Patient denies fever, rash, sore throat, blurred vision, nausea, vomiting, diarrhea, cough, shortness of breath or chest pain, joint or back pain, headache, or mood change.    Objective: Vital Signs: Blood pressure 130/61, pulse 81, temperature 97.9 F (36.6 C), temperature source Oral, resp. rate 18, height 5\' 3"  (1.6 m), weight 101.1 kg (222 lb 14.2 oz), SpO2 96 %. No results found. No results for input(s): WBC, HGB, HCT, PLT in the last 72 hours. Recent Labs    04/22/17 0429  NA 136  K 4.8  CL 103  GLUCOSE 116*  BUN 43*  CREATININE 2.20*  CALCIUM 10.0   CBG (last 3)  No results for input(s): GLUCAP in the last 72 hours.  Wt Readings from Last 3 Encounters:  04/24/17 101.1 kg (222 lb 14.2 oz)  04/21/17 99 kg (218 lb 4.1 oz)  04/15/17 100.2 kg (221 lb)    Physical Exam:  Constitutional: No distress . Vital signs reviewed. HEENT: EOMI, oral membranes moist Cardiovascular: RRR without murmur. No JVD    Respiratory: CTA Bilaterally without wheezes or rales. Normal effort    GI: BS +, non-tender, non-distended  Skin. Warm and dry Neurological: She isalertand oriented to person, place, and time.  .  Right central 7 with dysarthria reasonable insight and awareness.  Follows basic commands.  Higher level processing delays.  Language normal. RUE grossly 4/5 prox to distal with +PD. RLE: 2-/5 prox to 1+ ADF/PF.  LUE grossly 4+ to 5/5. LLE 3+/5 prox to 4/5 distally.  No sensory findings. No resting tone.  Psych: Pleasant and cooperative    Assessment/Plan: 1.  Functional and mobility deficits secondary to left posterior limb internal capsule infarct which require 3+ hours per day of interdisciplinary therapy in a comprehensive inpatient  rehab setting. Physiatrist is providing close team supervision and 24 hour management of active medical problems listed below. Physiatrist and rehab team continue to assess barriers to discharge/monitor patient progress toward functional and medical goals.  Function:  Bathing Bathing position   Position: Shower  Bathing parts Body parts bathed by patient: Right arm, Left arm, Chest, Abdomen, Front perineal area, Right upper leg, Left upper leg Body parts bathed by helper: Buttocks, Right lower leg, Left lower leg, Back  Bathing assist Assist Level: (Mod A)      Upper Body Dressing/Undressing Upper body dressing   What is the patient wearing?: Pull over shirt/dress     Pull over shirt/dress - Perfomed by patient: Thread/unthread right sleeve, Thread/unthread left sleeve, Put head through opening Pull over shirt/dress - Perfomed by helper: Pull shirt over trunk        Upper body assist Assist Level: Touching or steadying assistance(Pt > 75%)      Lower Body Dressing/Undressing Lower body dressing   What is the patient wearing?: Pants, Socks, Shoes       Pants- Performed by helper: Thread/unthread right pants leg, Thread/unthread left pants leg, Pull pants up/down       Socks - Performed by helper: Don/doff right sock, Don/doff left sock   Shoes - Performed by helper: Don/doff right shoe, Don/doff left shoe, Fasten right, Fasten left          Lower body assist Assist for lower body dressing: (Total  A)      Toileting Toileting Toileting activity did not occur: No continent bowel/bladder event   Toileting steps completed by helper: Adjust clothing prior to toileting, Performs perineal hygiene, Adjust clothing after toileting Toileting Assistive Devices: Toilet aid  Toileting assist Assist level: More than reasonable time   Transfers Chair/bed transfer     Chair/bed transfer assist level: Maximal assist (Pt 25 - 49%/lift and lower) Chair/bed transfer assistive  device: (Stedy)     Locomotion Ambulation     Max distance: (3 steps to w/c from recliner) Assist level: 2 helpers   Wheelchair   Type: Manual Max wheelchair distance: (100 ft) Assist Level: Maximal assistance (Pt 25 - 49%)  Cognition Comprehension Comprehension assist level: Understands basic 75 - 89% of the time/ requires cueing 10 - 24% of the time  Expression Expression assist level: Expresses basic 50 - 74% of the time/requires cueing 25 - 49% of the time. Needs to repeat parts of sentences.  Social Interaction Social Interaction assist level: Interacts appropriately 50 - 74% of the time - May be physically or verbally inappropriate.  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 Plan aspirin and Plavix 3 weeks then Plavix alone -Continue therapies 2. DVT Prophylaxis/Anticoagulation: SCDs. Monitor for any signs of DVT 3. Pain Management:Neurontin 300 mg 3 times a day, Tylenol as needed 4. Mood:Provide emotional support 5. Neuropsych: This patientiscapable of making decisions on herown behalf. 6. Skin/Wound Care:Routine skin checks 7. Fluids/Electrolytes/Nutrition:Routine I&O's with follow-up chemistries 8.Chronic combined systolic and diastolic congestive heart failure.Lasix 40 mg daily held due to soft blood pressures and mild bump in creatinine.    -Weight trending back up a bit Filed Weights   04/22/17 1700 04/23/17 0312 04/24/17 0541  Weight: 98.9 kg (218 lb 0.6 oz) 98.7 kg (217 lb 9.5 oz) 101.1 kg (222 lb 14.2 oz)    -Follow-up renal function in  the morning   -Resume Lasix at 20 mg daily  9.Cardiomyopathy with AICD. Follow-up cardiology services -follow daily weights as above 10.Hypertension. Coreg 25 mg twice a day,BIDIL20-37.59milligrams twice a day 11.Chronic right lower extremity edema.  .  12.CKDstage III. Baseline creatinine 1.69. 13.Hyperlipidemia. Lipitor 14.GERD. Protonix    LOS (Days) 2 A FACE TO FACE EVALUATION WAS PERFORMED  Meredith Staggers, MD 04/24/2017 7:41 AM

## 2017-04-24 NOTE — Plan of Care (Signed)
  Progressing Spiritual Needs Ability to function at adequate level 04/24/2017 1311 - Progressing by Claude Manges, LPN RH BOWEL ELIMINATION RH STG MANAGE BOWEL WITH ASSISTANCE Description STG Manage Bowel with Assistance. 04/24/2017 1311 - Progressing by Claude Manges, LPN RH STG MANAGE BOWEL W/MEDICATION W/ASSISTANCE Description STG Manage Bowel with Medication with Assistance. 04/24/2017 1311 - Progressing by Claude Manges, LPN RH BLADDER ELIMINATION RH STG MANAGE BLADDER WITH ASSISTANCE Description STG Manage Bladder With Assistance. Min  04/24/2017 1311 - Progressing by Claude Manges, LPN RH SKIN INTEGRITY RH STG SKIN FREE OF INFECTION/BREAKDOWN Description Skin free of breakdown with min assist  04/24/2017 1311 - Progressing by Claude Manges, LPN RH STG MAINTAIN SKIN INTEGRITY WITH ASSISTANCE Description STG Maintain Skin Integrity With Assistance. min  04/24/2017 1311 - Progressing by Claude Manges, LPN Consults [facility] STROKE PATIENT EDUCATION Description See Patient Education module for education specifics  04/24/2017 1311 - Progressing by Claude Manges, LPN

## 2017-04-24 NOTE — Plan of Care (Signed)
3/17 Modified due to filing error

## 2017-04-25 ENCOUNTER — Inpatient Hospital Stay (HOSPITAL_COMMUNITY): Payer: PPO | Admitting: Physical Therapy

## 2017-04-25 ENCOUNTER — Other Ambulatory Visit: Payer: Self-pay

## 2017-04-25 ENCOUNTER — Inpatient Hospital Stay (HOSPITAL_COMMUNITY): Payer: PPO | Admitting: Occupational Therapy

## 2017-04-25 ENCOUNTER — Inpatient Hospital Stay (HOSPITAL_COMMUNITY): Payer: PPO | Admitting: Speech Pathology

## 2017-04-25 DIAGNOSIS — N183 Chronic kidney disease, stage 3 (moderate): Secondary | ICD-10-CM

## 2017-04-25 LAB — CBC WITH DIFFERENTIAL/PLATELET
Basophils Absolute: 0 10*3/uL (ref 0.0–0.1)
Basophils Relative: 1 %
EOS ABS: 0.1 10*3/uL (ref 0.0–0.7)
EOS PCT: 3 %
HCT: 32.4 % — ABNORMAL LOW (ref 36.0–46.0)
HEMOGLOBIN: 10.4 g/dL — AB (ref 12.0–15.0)
LYMPHS ABS: 1.2 10*3/uL (ref 0.7–4.0)
LYMPHS PCT: 30 %
MCH: 29.8 pg (ref 26.0–34.0)
MCHC: 32.1 g/dL (ref 30.0–36.0)
MCV: 92.8 fL (ref 78.0–100.0)
MONOS PCT: 8 %
Monocytes Absolute: 0.3 10*3/uL (ref 0.1–1.0)
Neutro Abs: 2.3 10*3/uL (ref 1.7–7.7)
Neutrophils Relative %: 58 %
PLATELETS: 171 10*3/uL (ref 150–400)
RBC: 3.49 MIL/uL — AB (ref 3.87–5.11)
RDW: 17.1 % — ABNORMAL HIGH (ref 11.5–15.5)
WBC: 3.9 10*3/uL — AB (ref 4.0–10.5)

## 2017-04-25 LAB — COMPREHENSIVE METABOLIC PANEL
ALK PHOS: 79 U/L (ref 38–126)
ALT: 19 U/L (ref 14–54)
ANION GAP: 10 (ref 5–15)
AST: 17 U/L (ref 15–41)
Albumin: 3.4 g/dL — ABNORMAL LOW (ref 3.5–5.0)
BUN: 53 mg/dL — ABNORMAL HIGH (ref 6–20)
CALCIUM: 10.4 mg/dL — AB (ref 8.9–10.3)
CO2: 22 mmol/L (ref 22–32)
CREATININE: 1.93 mg/dL — AB (ref 0.44–1.00)
Chloride: 103 mmol/L (ref 101–111)
GFR calc non Af Amer: 24 mL/min — ABNORMAL LOW (ref >60)
GFR, EST AFRICAN AMERICAN: 28 mL/min — AB (ref >60)
Glucose, Bld: 106 mg/dL — ABNORMAL HIGH (ref 65–99)
Potassium: 5 mmol/L (ref 3.5–5.1)
Sodium: 135 mmol/L (ref 135–145)
Total Bilirubin: 0.5 mg/dL (ref 0.3–1.2)
Total Protein: 6.1 g/dL — ABNORMAL LOW (ref 6.5–8.1)

## 2017-04-25 NOTE — Progress Notes (Signed)
Speech Language Pathology Daily Session Note  Patient Details  Name: Tara Dunn MRN: [id number] Date of Birth: 1940/05/13  Today's Date: 04/25/2017 SLP Individual Time: 1310-1410 SLP Individual Time Calculation (min): 60 min  Short Term Goals: Week 1: SLP Short Term Goal 1 (Week 1): Patient will utilize a slow rate and over-articulation at the sentence level to achieve ~90% intelligibility with Min A verbal cues.  SLP Short Term Goal 2 (Week 1): Patient will complete higher-level naming/word-finding tasks with Min A multimodal cues.  SLP Short Term Goal 3 (Week 1): Patient will demonstrate functional problem solving for basic and familiar tasks with Mod A verbal cues.  SLP Short Term Goal 4 (Week 1): Patient will identify 2 physical and 2 cognitive deficits with Mod A verbal cues.  SLP Short Term Goal 5 (Week 1): Patient will utilize external memory aids to recall new, daily information with Mod A verbal and visual cues.  SLP Short Term Goal 6 (Week 1): Patient will demonstrate selective attention to functional tasks in a mildly distracting enviornment for ~30 minutes with supervision verbal cues for redirection.   Skilled Therapeutic Interventions: Skilled treatment session focused on cognition goals. SLP facilitated session by providing more than a reasonable amount of time to sequence 3 picture cards. Pt with phrase length description of each card using very basic 2 to 3 words utterances that were >50% intelligible with Mod A cues to use speech intelligibility strategies. Pt required Mod A to Min A cues to count money and Mod A to Max A to complete simple math problems. Pt with one instance of phonemic perseveration. Pt repeated SLP's previous statements x 2 when double checking for understanding of information. Pt was returned to room, left upright in wheelchair, safety belt donned and chair alarm on with all needs within reach. Continue per current plan of care.       Function:  Eating Eating   Modified Consistency Diet: No Eating Assist Level: Set up assist for;Supervision or verbal cues   Eating Set Up Assist For: Opening containers       Cognition Comprehension Comprehension assist level: Understands basic 75 - 89% of the time/ requires cueing 10 - 24% of the time  Expression   Expression assist level: Expresses basic 50 - 74% of the time/requires cueing 25 - 49% of the time. Needs to repeat parts of sentences.  Social Interaction Social Interaction assist level: Interacts appropriately 75 - 89% of the time - Needs redirection for appropriate language or to initiate interaction.  Problem Solving Problem solving assist level: Solves basic 75 - 89% of the time/requires cueing 10 - 24% of the time;Solves basic 90% of the time/requires cueing < 10% of the time  Memory Memory assist level: Recognizes or recalls 25 - 49% of the time/requires cueing 50 - 75% of the time    Pain    Therapy/Group: Individual Therapy  [name] 04/25/2017, 2:10 PM

## 2017-04-25 NOTE — Progress Notes (Signed)
Patient information reviewed and entered into eRehab system by Emagene Merfeld, RN, CRRN, PPS Coordinator.  Information including medical coding and functional independence measure will be reviewed and updated through discharge.     Per nursing patient was given "Data Collection Information Summary for Patients in Inpatient Rehabilitation Facilities with attached "Privacy Act Statement-Health Care Records" upon admission.  

## 2017-04-25 NOTE — Plan of Care (Signed)
  Progressing Spiritual Needs Ability to function at adequate level 04/25/2017 1403 - Progressing by Claude Manges, LPN RH BOWEL ELIMINATION RH STG MANAGE BOWEL WITH ASSISTANCE Description STG Manage Bowel with Assistance. 04/25/2017 1403 - Progressing by Claude Manges, LPN RH STG MANAGE BOWEL W/MEDICATION W/ASSISTANCE Description STG Manage Bowel with Medication with Assistance. 04/25/2017 1403 - Progressing by Claude Manges, LPN RH BLADDER ELIMINATION RH STG MANAGE BLADDER WITH ASSISTANCE Description STG Manage Bladder With Assistance. Min  04/25/2017 1403 - Progressing by Claude Manges, LPN RH SKIN INTEGRITY RH STG SKIN FREE OF INFECTION/BREAKDOWN Description Skin free of breakdown with min assist  04/25/2017 1403 - Progressing by Claude Manges, LPN RH STG MAINTAIN SKIN INTEGRITY WITH ASSISTANCE Description STG Maintain Skin Integrity With Assistance. min  04/25/2017 1403 - Progressing by Claude Manges, LPN Consults [facility] STROKE PATIENT EDUCATION Description See Patient Education module for education specifics  04/25/2017 1403 - Progressing by Claude Manges, LPN

## 2017-04-25 NOTE — Patient Outreach (Signed)
[facility]) Care Management  04/25/2017  Tara Dunn [birth year] [id number]  Assessment: 77 year old with history of CKD, Heart failure, stroke, ICD, OSA. Referral received 04/15/17 post hospital emergency room visit. Client hospitalized 3/12-3/15/19 for acute to subacute CVA. Client discharged to inpatient rehabilitation.   Plan: RNCM will refer to social work  For assistance with community care transition of care needs upon discharge.  Thea Silversmith, RN, MSN, [place] Coordinator Cell: [phone]

## 2017-04-25 NOTE — Progress Notes (Signed)
Pt declined use of CPAP 

## 2017-04-25 NOTE — Progress Notes (Signed)
Occupational Therapy Session Note  Patient Details  Name: Tara Dunn MRN: [id number] Date of Birth: March 24, 1940  Today's Date: 04/25/2017 OT Individual Time: 1030-1100 OT Individual Time Calculation (min): 30 min   Short Term Goals: Week 1:  OT Short Term Goal 1 (Week 1): Pt will complete 1/3 components of donning pants OT Short Term Goal 2 (Week 1): Pt will complete 1 grooming task in standing to improve balance OT Short Term Goal 3 (Week 1): Pt will complete 1/3 components of toileting   Skilled Therapeutic Interventions/Progress Updates:    Pt greeted in w/c. No c/o pain and agreeable to tx. Tx focus on functional transfers, ADL retraining, midline orientation, and visual scanning. Had her transfer to [facility] with use of Stedy for improving continence. Mod A sit<stand from low w/c and Min A from elevated BSC. She was able to lower clothing herself with balance assist. Within five minutes of sitting on BSC, pt continent of B+B. Max A required for hygiene and clothing mgt post toileting, and then pt completed handwashing while in supported standing. For remainder of session had pt work on crossword puzzle in [place], [employer] for [employer]. Pt able to scan for words with extra time and without issue or c/o blurriness. Throughout session, she required mod vcs for maintaining midline in Stedy due to Rt lean. At end of tx pt was returned to w/c and left with all needs, safety belt fastened, and chair alarm set.   Therapy Documentation Precautions:  Precautions Precautions: Fall Precaution Comments: Rt hemi Restrictions Weight Bearing Restrictions: No ADL: ADL ADL Comments: Please see functional navigator for ADL status    See Function Navigator for Current Functional Status.   Therapy/Group: Individual Therapy  Rishard Delange A Jalayla Chrismer 04/25/2017, 12:06 PM

## 2017-04-25 NOTE — Progress Notes (Signed)
[facility] PHYSICAL MEDICINE & REHABILITATION     PROGRESS NOTE    Subjective/Complaints:   Pt denies new issues overnite, still c/o R LE>UE weakness  ROS: Patient denies fever, rash, sore throat, blurred vision, nausea, vomiting, diarrhea, cough, shortness of breath or chest pain, joint or back pain, headache, or mood change.    Objective: Vital Signs: Blood pressure 124/62, pulse 76, temperature 98.2 F (36.8 C), temperature source Oral, resp. rate 18, height 5\' 3"  (1.6 m), weight 101.3 kg (223 lb 5.2 oz), SpO2 100 %. No results found. Recent Labs    04/25/17 0420  WBC 3.9*  HGB 10.4*  HCT 32.4*  PLT 171   Recent Labs    04/25/17 0420  NA 135  K 5.0  CL 103  GLUCOSE 106*  BUN 53*  CREATININE 1.93*  CALCIUM 10.4*   CBG (last 3)  No results for input(s): GLUCAP in the last 72 hours.  Wt Readings from Last 3 Encounters:  04/25/17 101.3 kg (223 lb 5.2 oz)  04/21/17 99 kg (218 lb 4.1 oz)  04/15/17 100.2 kg (221 lb)    Physical Exam:  Constitutional: No distress . Vital signs reviewed. HEENT: EOMI, oral membranes moist Cardiovascular: RRR without murmur. No JVD    Respiratory: CTA Bilaterally without wheezes or rales. Normal effort    GI: BS +, non-tender, non-distended  Skin. Warm and dry Neurological: She isalertand oriented to person, place, and time.  .  Right central 7 with dysarthria reasonable insight and awareness.  Follows basic commands.  Higher level processing delays.  Language normal. RUE grossly 4/5 prox to distal with +PD. RLE: 2-/5 prox to 1+ ADF/PF.  LUE grossly 4+ to 5/5. LLE 3+/5 prox to 4/5 distally.  No sensory findings. No resting tone.  Psych: Pleasant and cooperative    Assessment/Plan: 1.  Functional and mobility deficits secondary to left posterior limb internal capsule infarct which require 3+ hours per day of interdisciplinary therapy in a comprehensive inpatient rehab setting. Physiatrist is providing close team supervision  and 24 hour management of active medical problems listed below. Physiatrist and rehab team continue to assess barriers to discharge/monitor patient progress toward functional and medical goals.  Function:  Bathing Bathing position   Position: Shower  Bathing parts Body parts bathed by patient: Right arm, Left arm, Chest, Abdomen, Front perineal area, Right upper leg, Left upper leg Body parts bathed by helper: Buttocks, Right lower leg, Left lower leg, Back  Bathing assist Assist Level: (Mod A)      Upper Body Dressing/Undressing Upper body dressing   What is the patient wearing?: Pull over shirt/dress     Pull over shirt/dress - Perfomed by patient: Thread/unthread right sleeve, Thread/unthread left sleeve, Put head through opening Pull over shirt/dress - Perfomed by helper: Pull shirt over trunk        Upper body assist Assist Level: Touching or steadying assistance(Pt > 75%)      Lower Body Dressing/Undressing Lower body dressing   What is the patient wearing?: Pants, Socks, Shoes       Pants- Performed by helper: Thread/unthread right pants leg, Thread/unthread left pants leg, Pull pants up/down       Socks - Performed by helper: Don/doff right sock, Don/doff left sock   Shoes - Performed by helper: Don/doff right shoe, Don/doff left shoe, Fasten right, Fasten left          Lower body assist Assist for lower body dressing: (Total A)  Toileting Toileting Toileting activity did not occur: No continent bowel/bladder event   Toileting steps completed by helper: Adjust clothing prior to toileting, Performs perineal hygiene, Adjust clothing after toileting Toileting Assistive Devices: Toilet aid  Toileting assist Assist level: More than reasonable time   Transfers Chair/bed transfer     Chair/bed transfer assist level: Maximal assist (Pt 25 - 49%/lift and lower) Chair/bed transfer assistive device: (Stedy)     Locomotion Ambulation     Max distance: (3  steps to w/c from recliner) Assist level: 2 helpers   Wheelchair   Type: Manual Max wheelchair distance: (100 ft) Assist Level: Maximal assistance (Pt 25 - 49%)  Cognition Comprehension Comprehension assist level: Understands basic 75 - 89% of the time/ requires cueing 10 - 24% of the time  Expression Expression assist level: Expresses basic 50 - 74% of the time/requires cueing 25 - 49% of the time. Needs to repeat parts of sentences.  Social Interaction Social Interaction assist level: Interacts appropriately 50 - 74% of the time - May be physically or verbally inappropriate.  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 Plan aspirin and Plavix 3 weeks then Plavix alone -Continue CIR PT, OT, SLP 2. DVT Prophylaxis/Anticoagulation: SCDs. Monitor for any signs of DVT 3. Pain Management:Neurontin 300 mg 3 times a day, Tylenol as needed 4. Mood:Provide emotional support 5. Neuropsych: This patientiscapable of making decisions on herown behalf. 6. Skin/Wound Care:Routine skin checks 7. Fluids/Electrolytes/Nutrition:Routine I&O's with follow-up chemistries 8.Chronic combined systolic and diastolic congestive heart failure.Lasix 40 mg daily held due to soft blood pressures and mild bump in creatinine.    -Weight trending back up a bit Filed Weights   04/23/17 0312 04/24/17 0541 04/25/17 0500  Weight: 98.7 kg (217 lb 9.5 oz) 101.1 kg (222 lb 14.2 oz) 101.3 kg (223 lb 5.2 oz)    -Follow-up renal function in the morning   -Resume Lasix at 20 mg daily  9.Cardiomyopathy  with AICD. Follow-up cardiology services -follow daily weights as above 10.Hypertension. Coreg 25 mg twice a day,BIDIL20-37.85milligrams twice a day Vitals:   04/24/17 2043 04/25/17 0538  BP: (!) 143/78 124/62  Pulse: 75 76  Resp:  18  Temp:  98.2 F (36.8 C)  SpO2:  100%   11.Chronic right lower extremity edema.  .  12.CKDstage III. Baseline creatinine 1.69. 13.Hyperlipidemia. Lipitor 14.GERD. Protonix    LOS (Days) 3 A FACE TO FACE EVALUATION WAS PERFORMED  Charlett Blake, MD 04/25/2017 7:13 AM

## 2017-04-25 NOTE — Progress Notes (Signed)
Physical Therapy Session Note  Patient Details  Name: Tara Dunn MRN: [id number] Date of Birth: May 21, 1940  Today's Date: 04/25/2017 PT Individual Time: 0830-0930 and 1500-1530 PT Individual Time Calculation (min): 60 min and 30 min (total 90 min)   Short Term Goals: Week 1:  PT Short Term Goal 1 (Week 1): Pt will perform sit to stand with mod A x 1 with RW from wheelchair. PT Short Term Goal 2 (Week 1): Pt will transfer w/c to mat with mod A x 1 and LRAD. PT Short Term Goal 3 (Week 1): Pt will ambulate 10 ft with mod A x 1 with RW.  Skilled Therapeutic Interventions/Progress Updates: Tx 1: Pt received supine in bed, denies pain and agreeable to treatment. Pt dons shirt with setupA while reclined in bed. Supine>sit with S, HOB elevated and bedrails with increased time required d/t difficulty coordinating scooting hips to EOB. Pants donned modA; therapist threaded RLE, pt threads LLE. Sit >stand modA with RW; min guard for balance while pt attempting to pull up pants with BUE, however unable to clear hips and required assist from therapist to complete. Squat pivot transfer to L side modA, increased time for weight shifting and LE placement. Seated in w/c at sink pt performs oral hygiene and washing face setupA. W/c propulsion BUE x100' with minA for hand over hand technique to facilitate increased stroke length RLE to maintain straight trajectory. Gait forward x15' backward x15 with min/modA overall; increased assist for backwards walking to facilitate hip extension/knee flexion and larger step length. Second trial x25' forward minA; backward x10' before fatigued and pt self-selecting turning towards wall and performing side steps to L; assist for facilitating R glutes in stance. Standing alternating toe taps to 3" step for forced use RLE; requires maxA for coordinating RLE up/down on step, min guard for RLE stance control with LLE taps. Remained seated in w/c at end of session, quick release belt  and chair alarm intact, all needs in reach.  Tx 2: Pt received seated in w/c, denies pain and agreeable to treatment session. Pt's sister present for session. Transported to gym totalA for energy conservation. Sit >stand modA from w/c at L hall rail; upon standing pt noted to have been incontinent of bladder. Completed gait trial x25' with min/modA for RLE progression. Stand pivot at end of trial modA to sit in standard chair. Stand pivot chair>w/c with minA and increased time with cues for technique. Sit <>stand in stedy minA to complete hygiene; pt assists with donning/doffing pants and brief, performs peri hygiene in front but requires totalA in back d/t body habitus. Sit >stand with RW x1 trial with modA to pull up clean pants at end of session. Remained seated in w/c at end of session, quick release belt and chair alarm intact, family present. RN alerted to pt incontinence and discussed possibility of starting timed toileting. Will continue to discuss with team.     Therapy Documentation Precautions:  Precautions Precautions: Fall Precaution Comments: Rt hemi Restrictions Weight Bearing Restrictions: No Pain: Pain Assessment Pain Assessment: 0-10 Pain Score: 0-No pain  See Function Navigator for Current Functional Status.   Therapy/Group: Individual Therapy  Luberta Mutter 04/25/2017, 10:10 AM

## 2017-04-25 NOTE — Progress Notes (Signed)
Social Work  Social Work Assessment and Plan  Patient Details  Name: Tara Dunn MRN: [id number] Date of Birth: [birth year]  Today's Date: 04/25/2017  Problem List:  Patient Active Problem List   Diagnosis Date Noted  . AKI (acute kidney injury) ([place])   . Peripheral edema   . Stage 3 chronic kidney disease ([place])   . Benign essential HTN   . History of CVA with residual deficit   . Cerebrovascular accident (CVA) due to thrombosis of left middle cerebral artery ([place])   . Right leg weakness 04/19/2017  . Right hemiparesis ([place]) 04/19/2017  . Thalamic infarct, acute ([place]) 04/19/2017  . Joint pain 12/09/2015  . ICD (implantable cardioverter-defibrillator) in place 03/26/2015  . Syncope 03/06/2015  . Obesity (BMI 30-39.9) 01/21/2015  . Excessive daytime sleepiness 08/04/2014  . OSA (obstructive sleep apnea) 08/04/2014  . Essential hypertension 06/13/2014  . Chronic systolic CHF (congestive heart failure) ([place]) 05/20/2014  . Congestive heart disease ([place])   . Chronic combined systolic and diastolic CHF (congestive heart failure) ([place]) 05/03/2014  . CKD (chronic kidney disease), stage III ([place]) 05/03/2014  . Hx of stroke without residual deficits 05/03/2014   Past Medical History:  Past Medical History:  Diagnosis Date  . 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 Lovena Le  . Aortic dilatation ([place])    a. 04/2014 CTA chest w/ incidental finding of distal thoracic Ao enlargement of 3.39 mm - f/u needed 04/2015.  . Arthritis    "all over my body"  . Cancer of left breast ([place]) 2009   s/p L mastectomy and chemo.  . Cardiomyopathy ([place])    a. 04/2014 Echo: EF 25-30%, mod conc LVH, possibl antsept HK, Gr1 DD, mod-sev dil LA.  . CHF (congestive heart failure) ([place])   . CKD (chronic kidney disease), stage III ([place])   . Depression   . GERD (gastroesophageal reflux disease)   . Gout   . Heart murmur   . History of stomach ulcers   . Hypertension     a. Dx @ age 77;  b. 04/2014 admission for HTN emergency.  . Obesity (BMI 30-39.9) 01/21/2015  . OSA on CPAP   . Stroke [facility])    a. 3 strokes - last in 1998; "memory problems since"  (03/26/2015)   Past Surgical History:  Past Surgical History:  Procedure Laterality Date  . ABDOMINAL HYSTERECTOMY    . BREAST BIOPSY Left 2008  . CATARACT EXTRACTION, BILATERAL Bilateral   . DILATION AND CURETTAGE OF UTERUS    . EP IMPLANTABLE DEVICE N/A 03/26/2015   MDT single chamber ICD, Dr. Lovena Le  . LEFT HEART CATHETERIZATION WITH CORONARY ANGIOGRAM N/A 05/07/2014   Procedure: LEFT HEART CATHETERIZATION WITH CORONARY ANGIOGRAM;  Surgeon: Belva Crome, MD;  Location: [facility] CATH LAB;  Service: Cardiovascular;  Laterality: N/A;  . MASTECTOMY Left 2009   Social History:  reports that she has quit smoking. Her smoking use included cigarettes. She has a 20.00 pack-year smoking history. she has never used smokeless tobacco. She reports that she does not drink alcohol or use drugs.  Family / Support Systems Marital Status: Widow/Widower Patient Roles: Parent, Other (Comment)(grandparent) Children: Aviva Kluver [phone] Other Supports: Other children-six children Anticipated Caregiver: [name] Ability/Limitations of Caregiver: None can provide assist Caregiver Availability: 24/7 Family Dynamics: Close knit family who are there for one another. She has fours boys and two girls all of which are involved and supportive. She has friends and neighbors who visit and  are supportive.  Social History Preferred language: English Religion: Pentecostal Cultural Background: No issues Education: High School Read: Yes Write: Yes Employment Status: Retired Freight forwarder Issues: No issues Guardian/Conservator: None-according to MD pt is capable of making her own decisions while here   Abuse/Neglect Abuse/Neglect Assessment Can Be Completed: Yes Physical Abuse:  Denies Verbal Abuse: Denies Sexual Abuse: Denies Exploitation of patient/patient's resources: Denies Self-Neglect: Denies  Emotional Status Pt's affect, behavior adn adjustment status: Pt is motivated to do well and has always been able to take care of herself. She wants to get back to this level. She is challenged now due to this is her good side, her other strokes were on her left side and now she is dealing with this. She plans to take each day at a time and get through it like she always has. Recent Psychosocial Issues: other health issues-pushed through to remain independent Pyschiatric History: No history-deferred depression screen she seems to be coping appropriately and uses her faith to pull herself through. She may benefit from neuro-psych will ask input from the team members.  Substance Abuse History: No issues  Patient / Family Perceptions, Expectations & Goals Pt/Family understanding of illness & functional limitations: Pt and daughter can explain her stroke and deficits. She does talk with the MD and both feel they understand reason she is here and the treatment plan going forward. Pt is hopeful she will do well here. Daughter plans to be here often to see her MOm's progress and provide support. Premorbid pt/family roles/activities: Mom, grandparent, retiree, friend, neighbor, etc Anticipated changes in roles/activities/participation: resume Pt/family expectations/goals: Pt states: " I want to be able to take care of myself like I was doing, my daughter helped me some but I could walk on my own."  Daughter states: " I will assist but hope she doesn't need lifting."  [employer]: Other (Comment)(Lives at [employer] living) Premorbid Home Care/DME Agencies: Other (Comment)(Had HHRN-[facility] prior to admission) Transportation available at discharge: Daughter Resource referrals recommended: Support group (specify)  Discharge Planning Living  Arrangements: Alone Support Systems: Children, Other relatives, Friends/neighbors, Church/faith community Type of Residence: Private residence Insurance Resources: Multimedia programmer (specify)(Health Team Advantage) Financial Resources: Social Security Financial Screen Referred: No Living Expenses: Rent Money Management: Patient, Family Does the patient have any problems obtaining your medications?: No Home Management: Daughter Patient/Family Preliminary Plans: Plans to go to daughter's home upon discharge and then see if can return to her apartment after progressing with rehab. Discussed this is a good plan usually pt will need someone with her when first going home from the hospital. According to team's evaluations she will require care upon discharge from rehab.  Clinical Impression Pleasant female who is motivated to do well here and regain her independence. She has had a stroke in the past and recovered and is hopeful she will again. Her daughter was assisting her prior to admission with ADL's, but pt was able to ambulate on her own. Will work with pt and her daughter on her discharge needs. Encouraged daughter to come in and attend therapies with pt to see her progress and what care she will require at discharge.  Elease Hashimoto 04/25/2017, 1:18 PM

## 2017-04-25 NOTE — Care Management Note (Signed)
Inpatient Rehabilitation Center Individual Statement of Services  Patient Name:  Tara Dunn  Date:  04/25/2017  Welcome to the [facility].  Our goal is to provide you with an individualized program based on your diagnosis and situation, designed to meet your specific needs.  With this comprehensive rehabilitation program, you will be expected to participate in at least 3 hours of rehabilitation therapies Monday-Friday, with modified therapy programming on the weekends.  Your rehabilitation program will include the following services:  Physical Therapy (PT), Occupational Therapy (OT), Speech Therapy (ST), 24 hour per day rehabilitation nursing, Therapeutic Recreaction (TR), Case Management (Social Worker), Rehabilitation Medicine, Nutrition Services and Pharmacy Services  Weekly team conferences will be held on Wednesday to discuss your progress.  Your Social Worker will talk with you frequently to get your input and to update you on team discussions.  Team conferences with you and your family in attendance may also be held.  Expected length of stay: 16-18 days Overall anticipated outcome: min level of assist   Depending on your progress and recovery, your program may change. Your Social Worker will coordinate services and will keep you informed of any changes. Your Social Worker's name and contact numbers are listed  below.  The following services may also be recommended but are not provided by the [facility]:    [facility] [place] [facility] will be made to provide these services after discharge if needed.  Arrangements include referral to agencies that provide these services.  Your insurance has been verified to be:  [employer] Your primary doctor is:  Rachell Cipro  Pertinent information will be shared with your doctor and your insurance company.  Social Worker:   Ovidio Kin, [place] or ([phone]  Information discussed with and copy given to patient by: Elease Hashimoto, 04/25/2017, 1:00 PM

## 2017-04-25 NOTE — Progress Notes (Signed)
Pt refused cpap. Will continue to monitor. No distress noted.

## 2017-04-25 NOTE — IPOC Note (Signed)
Overall Plan of Care [facility]) Patient Details Name: Tara Dunn MRN: [id number] DOB: [birth year]  Admitting Diagnosis: Cerebrovascular accident (CVA) due to thrombosis of left middle cerebral artery [facility])  Hospital Problems: Principal Problem:   Cerebrovascular accident (CVA) due to thrombosis of left middle cerebral artery (HCC) Active Problems:   Chronic combined systolic and diastolic CHF (congestive heart failure) (HCC)   Essential hypertension   Right hemiparesis (HCC)   Stage 3 chronic kidney disease ([place])     Functional Problem List: Nursing Bowel, Endurance, Motor  PT Balance, Endurance, Motor, Pain, Perception, Safety  OT Balance, Cognition, Safety, Endurance, Motor  SLP    TR         Basic ADL's: OT Eating, Grooming, Bathing, Dressing, Toileting     Advanced  ADL's: OT Simple Meal Preparation     Transfers: PT Bed Mobility, Bed to Chair  OT Toilet, Tub/Shower     Locomotion: PT Ambulation, Wheelchair Mobility, Stairs     Additional Impairments: OT None  SLP Communication, Social Cognition expression Problem Solving, Memory, Attention, Awareness  TR      Anticipated Outcomes Item Anticipated Outcome  Self Feeding Min A  Swallowing      Basic self-care  Min A  Toileting  Mod A   Bathroom Transfers Min A  Bowel/Bladder  to be continent with min assist  Transfers  Min A  Locomotion  Min A  Communication  Supervision  Cognition  Min A   Pain  pain reported less than 3  Safety/Judgment  to remain fall free while in rehab   Therapy Plan: PT Intensity: Minimum of 1-2 x/day ,45 to 90 minutes PT Duration Estimated Length of Stay: 14 to 18 days OT Intensity: Minimum of 1-2 x/day, 45 to 90 minutes OT Frequency: 5 out of 7 days OT Duration/Estimated Length of Stay: 16-18 days SLP Intensity: Minumum of 1-2 x/day, 30 to 90 minutes SLP Frequency: 3 to 5 out of 7 days SLP Duration/Estimated Length of Stay: 14-18 days     Team  Interventions: Nursing Interventions Patient/Family Education, Bowel Management, Bladder Management  PT interventions Ambulation/gait training, Discharge planning, Functional mobility training, Psychosocial support, Therapeutic Activities, Visual/perceptual remediation/compensation, Training and development officer, Disease management/prevention, Neuromuscular re-education, Therapeutic Exercise, Wheelchair propulsion/positioning, Cognitive remediation/compensation, Pain management, UE/LE Strength taining/ROM, Community reintegration, Barrister's clerk education, IT trainer, UE/LE Coordination activities  OT Interventions Training and development officer, Discharge planning, Functional electrical stimulation, Pain management, Self Care/advanced ADL retraining, Therapeutic Activities, UE/LE Coordination activities, Visual/perceptual remediation/compensation, Therapeutic Exercise, Skin care/wound managment, Patient/family education, Functional mobility training, Disease mangement/prevention, Cognitive remediation/compensation, Academic librarian, Engineer, drilling, Neuromuscular re-education, Psychosocial support, Splinting/orthotics, UE/LE Strength taining/ROM, Wheelchair propulsion/positioning  SLP Interventions Cognitive remediation/compensation, Environmental controls, Internal/external aids, Speech/Language facilitation, Therapeutic Activities, Patient/family education, Functional tasks, Cueing hierarchy  TR Interventions    SW/CM Interventions Discharge Planning, Patient/Family Education, Psychosocial Support   Barriers to Discharge MD  Medical stability  Nursing Decreased caregiver support, Incontinence    PT Medical stability, Home environment access/layout    OT Decreased caregiver support    SLP      SW       Team Discharge Planning: Destination: PT-Home ,OT- Home , SLP-Home Projected Follow-up: PT-Home health PT, 24 hour supervision/assistance, OT-  Home health OT,  SLP-Home Health SLP, 24 hour supervision/assistance, Outpatient SLP Projected Equipment Needs: PT-To be determined, OT- To be determined, SLP-None recommended by SLP Equipment Details: PT- , OT-  Patient/family involved in discharge planning: PT- Patient, Family member/caregiver,  OT-Patient, Family member/caregiver, SLP-Patient  MD ELOS:  14-18 days Medical Rehab Prognosis:  Excellent Assessment: The patient has been admitted for CIR therapies with the diagnosis of left MCA infarct. The team will be addressing functional mobility, strength, stamina, balance, safety, adaptive techniques and equipment, self-care, bowel and bladder mgt, patient and caregiver education, NMR, cognition, communication, ego support. Goals have been set at min to mod assist with self-care and ADL's, min assist with mobility and min to supervision with cognition.Meredith Staggers, MD, FAAPMR      See Team Conference Notes for weekly updates to the plan of care

## 2017-04-26 ENCOUNTER — Inpatient Hospital Stay (HOSPITAL_COMMUNITY): Payer: PPO | Admitting: Occupational Therapy

## 2017-04-26 ENCOUNTER — Inpatient Hospital Stay (HOSPITAL_COMMUNITY): Payer: PPO | Admitting: Speech Pathology

## 2017-04-26 ENCOUNTER — Inpatient Hospital Stay (HOSPITAL_COMMUNITY): Payer: PPO | Admitting: Physical Therapy

## 2017-04-26 MED ORDER — HEPARIN SODIUM (PORCINE) 5000 UNIT/ML IJ SOLN
5000.0000 [IU] | Freq: Three times a day (TID) | INTRAMUSCULAR | Status: DC
  Administered 2017-04-26 – 2017-05-11 (×46): 5000 [IU] via SUBCUTANEOUS
  Filled 2017-04-26 (×43): qty 1

## 2017-04-26 NOTE — Plan of Care (Signed)
  RH BLADDER ELIMINATION RH STG MANAGE BLADDER WITH ASSISTANCE Description STG Manage Bladder With Assistance. Min  04/26/2017 1404 - Progressing by Blinda Leatherwood, RN   RH SKIN INTEGRITY RH STG SKIN FREE OF INFECTION/BREAKDOWN Description Skin free of breakdown with min assist  04/26/2017 1404 - Progressing by Blinda Leatherwood, RN  Incont bladder episodes. Keep skin dry and cleanse after incont episode to keep skin from breaking down and administer barrier cream.

## 2017-04-26 NOTE — Progress Notes (Signed)
[place] PHYSICAL MEDICINE & REHABILITATION     PROGRESS NOTE    Subjective/Complaints:  No issues overnite.  No SOB or leg swelling   ROS: Patient denies fever, rash, sore throat, blurred vision, nausea, vomiting, diarrhea, cough, shortness of breath or chest pain, joint or back pain, headache, or mood change.    Objective: Vital Signs: Blood pressure (!) 108/52, pulse 72, temperature 97.8 F (36.6 C), temperature source Oral, resp. rate 16, height 5\' 3"  (1.6 m), weight 101.4 kg (223 lb 8.7 oz), SpO2 98 %. No results found. Recent Labs    04/25/17 0420  WBC 3.9*  HGB 10.4*  HCT 32.4*  PLT 171   Recent Labs    04/25/17 0420  NA 135  K 5.0  CL 103  GLUCOSE 106*  BUN 53*  CREATININE 1.93*  CALCIUM 10.4*   CBG (last 3)  No results for input(s): GLUCAP in the last 72 hours.  Wt Readings from Last 3 Encounters:  04/26/17 101.4 kg (223 lb 8.7 oz)  04/21/17 99 kg (218 lb 4.1 oz)  04/15/17 100.2 kg (221 lb)    Physical Exam:  Constitutional: No distress . Vital signs reviewed. HEENT: EOMI, oral membranes moist Cardiovascular: RRR without murmur. No JVD    Respiratory: CTA Bilaterally without wheezes or rales. Normal effort    GI: BS +, non-tender, non-distended  Skin. Warm and dry Neurological: She isalertand oriented to person, place, and time.  .  Right central 7 with dysarthria reasonable insight and awareness.  Follows basic commands.  Higher level processing delays.  Language normal. RUE grossly 4/5 prox to distal with +PD. RLE: 2-/5 prox to 1+ ADF/PF.  LUE grossly 4+ to 5/5. LLE 3+/5 prox to 4/5 distally.  No sensory findings. No resting tone.  Psych: Pleasant and cooperative    Assessment/Plan: 1.  Functional and mobility deficits secondary to left posterior limb internal capsule infarct which require 3+ hours per day of interdisciplinary therapy in a comprehensive inpatient rehab setting. Physiatrist is providing close team supervision and 24 hour  management of active medical problems listed below. Physiatrist and rehab team continue to assess barriers to discharge/monitor patient progress toward functional and medical goals.  Function:  Bathing Bathing position   Position: Shower  Bathing parts Body parts bathed by patient: Right arm, Left arm, Chest, Abdomen, Front perineal area, Right upper leg, Left upper leg Body parts bathed by helper: Buttocks, Right lower leg, Left lower leg, Back  Bathing assist Assist Level: (Mod A)      Upper Body Dressing/Undressing Upper body dressing   What is the patient wearing?: Pull over shirt/dress     Pull over shirt/dress - Perfomed by patient: Thread/unthread right sleeve, Thread/unthread left sleeve, Put head through opening, Pull shirt over trunk Pull over shirt/dress - Perfomed by helper: Pull shirt over trunk        Upper body assist Assist Level: Supervision or verbal cues      Lower Body Dressing/Undressing Lower body dressing   What is the patient wearing?: Pants, Socks, Shoes     Pants- Performed by patient: Thread/unthread left pants leg Pants- Performed by helper: Thread/unthread right pants leg, Pull pants up/down       Socks - Performed by helper: Don/doff left sock, Don/doff right sock   Shoes - Performed by helper: Don/doff right shoe, Don/doff left shoe, Fasten right, Fasten left          Lower body assist Assist for lower body dressing: (maxA)  Toileting Toileting Toileting activity did not occur: No continent bowel/bladder event Toileting steps completed by patient: Adjust clothing prior to toileting Toileting steps completed by helper: Performs perineal hygiene, Adjust clothing after toileting Toileting Assistive Devices: Toilet aid  Toileting assist Assist level: More than reasonable time   Transfers Chair/bed transfer   Chair/bed transfer method: Squat pivot Chair/bed transfer assist level: Moderate assist (Pt 50 - 74%/lift or  lower) Chair/bed transfer assistive device: Armrests     Locomotion Ambulation     Max distance: 25 Assist level: Moderate assist (Pt 50 - 74%)   Wheelchair   Type: Manual Max wheelchair distance: 00 Assist Level: Touching or steadying assistance (Pt > 75%)  Cognition Comprehension Comprehension assist level: Understands basic 75 - 89% of the time/ requires cueing 10 - 24% of the time  Expression Expression assist level: Expresses basic 50 - 74% of the time/requires cueing 25 - 49% of the time. Needs to repeat parts of sentences.  Social Interaction Social Interaction assist level: Interacts appropriately 75 - 89% of the time - Needs redirection for appropriate language or to initiate interaction.  Problem Solving Problem solving assist level: Solves basic 75 - 89% of the time/requires cueing 10 - 24% of the time, Solves basic 90% of the time/requires cueing < 10% of the time  Memory Memory assist level: Recognizes or recalls 25 - 49% of the time/requires cueing 50 - 75% of the time  Medical Problem List and Plan: 1.Right side weakness and dysarthriasecondary to lacunar infarction posterior limb ofleftinternal capsule secondary to small vessel disease. Plan aspirin and Plavix 3 weeks then Plavix alone -Continue CIR PT, OT, SLP 2. DVT Prophylaxis/Anticoagulation: start heparin, d/c SCD . Monitor for any signs of DVT 3. Pain Management:Neurontin 300 mg 3 times a day, Tylenol as needed 4. Mood:Provide emotional support 5. Neuropsych: This patientiscapable of making decisions on herown behalf. 6. Skin/Wound Care:Routine skin checks 7. Fluids/Electrolytes/Nutrition:Routine I&O's with follow-up chemistries 8.Chronic combined systolic and diastolic congestive heart failure.Lasix 40 mg daily reduced to 20mg  but will hold due to elevated BUN/Creat   -EJ FX 45-50% with Gr 1 DD  Filed Weights   04/24/17 0541 04/25/17 0500 04/26/17 0558  Weight: 101.1 kg (222 lb 14.2  oz) 101.3 kg (223 lb 5.2 oz) 101.4 kg (223 lb 8.7 oz)    -Follow-up renal function in the morning   -Resume Lasix at 20 mg daily  9.Cardiomyopathy with AICD. Follow-up cardiology services -stable daily weights as above 10.Hypertension. Coreg 25 mg twice a day,BIDIL20-37.39milligrams twice a day Vitals:   04/25/17 1432 04/26/17 0558  BP: 113/62 (!) 108/52  Pulse: 66 72  Resp: 17 16  Temp: 98.6 F (37 C) 97.8 F (36.6 C)  SpO2: 100% 98%   11.Chronic right lower extremity edema.  .  12.CKDstage III. Baseline creatinine 1.69.3/18 1.93, with BUN 53- will d/c Lasix   13.Hyperlipidemia. Lipitor 14.GERD. Protonix    LOS (Days) 4 A FACE TO FACE EVALUATION WAS PERFORMED  Charlett Blake, MD 04/26/2017 7:39 AM

## 2017-04-26 NOTE — Progress Notes (Signed)
Speech Language Pathology Daily Session Note  Patient Details  Name: Tara Dunn MRN: [id number] Date of Birth: 11-23-1940  Today's Date: 04/26/2017 SLP Individual Time: 0804-0900 SLP Individual Time Calculation (min): 56 min  Short Term Goals: Week 1: SLP Short Term Goal 1 (Week 1): Patient will utilize a slow rate and over-articulation at the sentence level to achieve ~90% intelligibility with Min A verbal cues.  SLP Short Term Goal 2 (Week 1): Patient will complete higher-level naming/word-finding tasks with Min A multimodal cues.  SLP Short Term Goal 3 (Week 1): Patient will demonstrate functional problem solving for basic and familiar tasks with Mod A verbal cues.  SLP Short Term Goal 4 (Week 1): Patient will identify 2 physical and 2 cognitive deficits with Mod A verbal cues.  SLP Short Term Goal 5 (Week 1): Patient will utilize external memory aids to recall new, daily information with Mod A verbal and visual cues.  SLP Short Term Goal 6 (Week 1): Patient will demonstrate selective attention to functional tasks in a mildly distracting enviornment for ~30 minutes with supervision verbal cues for redirection.   Skilled Therapeutic Interventions:  Pt was seen for skilled ST targeting cognitive-linguistic goals.  Pt was in bed upon arrival, awake, alert, and agreeable to participating in [place].  Pt requested to use the bathroom and could return demonstration for use of cal bell, including expressing her needs to nursing, with min assist verbal cues.  Pt could return demonstration of safety precautions during multiple transfers with [facility] Lift and +2 assist from nurse tech with supervision verbal cues.  Therapist facilitated the session with a novel verbal description task targeting word finding and speech intelligibility.  Pt needed mod assist verbal cues for use of description to compensate for word finding difficulty during task.  Pt also needed min assist verbal cues to slow rate and  increase vocal intensity to achieve intelligibility at the phrase level.  SLP reviewed and reinforced compensatory word finding and intelligibility strategies to maximize functional independence for communication.  Pt was returned to room and left in wheelchair with call bell within reach, quick release belt donned and chair alarm set.    Function:  Eating Eating                 Cognition Comprehension Comprehension assist level: Understands basic 90% of the time/cues < 10% of the time  Expression   Expression assist level: Expresses basic 75 - 89% of the time/requires cueing 10 - 24% of the time. Needs helper to occlude trach/needs to repeat words.  Social Interaction Social Interaction assist level: Interacts appropriately 75 - 89% of the time - Needs redirection for appropriate language or to initiate interaction.  Problem Solving Problem solving assist level: Solves basic 75 - 89% of the time/requires cueing 10 - 24% of the time  Memory Memory assist level: Recognizes or recalls 75 - 89% of the time/requires cueing 10 - 24% of the time    Pain Pain Assessment Pain Assessment: No/denies pain  Therapy/Group: Individual Therapy  [name], Elmyra Ricks L 04/26/2017, 9:00 AM

## 2017-04-26 NOTE — Progress Notes (Signed)
Pt has not used CPAP in several days.  No distress.

## 2017-04-26 NOTE — Progress Notes (Signed)
Occupational Therapy Session Note  Patient Details  Name: Tara Dunn MRN: [id number] Date of Birth: 1940/09/19  Today's Date: 04/26/2017 OT Individual Time: 7793-9030 OT Individual Time Calculation (min): 75 min    Short Term Goals: Week 1:  OT Short Term Goal 1 (Week 1): Pt will complete 1/3 components of donning pants OT Short Term Goal 2 (Week 1): Pt will complete 1 grooming task in standing to improve balance OT Short Term Goal 3 (Week 1): Pt will complete 1/3 components of toileting   Skilled Therapeutic Interventions/Progress Updates:    Pt presents sitting up in w/c agreeable to OT tx session, no c/o pain. Pt completing bathing/dressing ADLs seated in w/c at sink, with setup/supervision provided for UB bathing/dressing. Pt requires Mod-MaxA for sit<>stand at sink during ADL completion, able to maintain static standing with bil UE support with MinA; completing sit<>stand x3 for doffing/donning pants and to wash buttocks. Pt requires ModA for LB bathing, MaxA to thread pants over LEs and to advance pants over hips, total assist for footwear. After ADL completion transported pt via w/c to therapy gym, pt completing squat pivot transfer w/c to mat table to the L with ModA, requires assist for reciprocal scooting of R hip to reposition on mat. Pt engaged in seated fine motor peg board activity, completing task with min questioning cues. Pt completes additional card matching activity with focus on lateral/anterior lean and leaning outside BOS, with pt completing with minguard throughout. Pt returned to w/c and to room in manner described above where pt was left seated in w/c, chair alarm set, QRB donned, call bell and needs within reach.   Therapy Documentation Precautions:  Precautions Precautions: Fall Precaution Comments: Rt hemi Restrictions Weight Bearing Restrictions: No     ADL: ADL ADL Comments: Please see functional navigator for ADL status  See Function Navigator for  Current Functional Status.   Therapy/Group: Individual Therapy  Raymondo Band 04/26/2017, 4:12 PM

## 2017-04-26 NOTE — Progress Notes (Signed)
Physical Therapy Session Note  Patient Details  Name: Tara Dunn MRN: [id number] Date of Birth: 06/25/1940  Today's Date: 04/26/2017 PT Individual Time: 1100-1200 PT Individual Time Calculation (min): 60 min   Short Term Goals: Week 1:  PT Short Term Goal 1 (Week 1): Pt will perform sit to stand with mod A x 1 with RW from wheelchair. PT Short Term Goal 2 (Week 1): Pt will transfer w/c to mat with mod A x 1 and LRAD. PT Short Term Goal 3 (Week 1): Pt will ambulate 10 ft with mod A x 1 with RW.  Skilled Therapeutic Interventions/Progress Updates: Pt received seated in w/c, denies pain and agreeable to treatment. Transported to gym totalA for energy conservation. Stand pivot transfer minA to mat with increased time, cues for hand placement and technique. Forward weight shifts/trunk flexion with BUE stability ball pushes towards target for carryover into anterior weight shift sit >stand. Standing balance on small red wedge for gastroc lengthening and facilitation of closed chain dorsiflexion for ankle righting reactions to posterior LOB. Standing balance then performed on level surface with min guard x30 sec, min cues for L weight shift to neutral. Gait with RW x25, x35' with minA for RLE progression especially with turns, reduced assist once pt taken several beginning steps and develops reciprocal pattern. Standing heel/toe raises 2x10 reps; minimal clearance of heels/toes off floor d/t strength/coordination/power production deficits. BLE hamstring PROM x1 min each Sit supine modA for LE management. Supine bridging 2x10 reps; second set with pillow squeeze adduction isometric however poor control. Supine>sit minA, increased time and cues needed for technique. Stand pivot to R to return to w/c required modA d/t RLE fatigue and difficulty with turning foot to sit in chair. Remained seated in w/c, chair alarm and quick release belt intact all needs in reach and setup for lunch at end of session.       Therapy Documentation Precautions:  Precautions Precautions: Fall Precaution Comments: Rt hemi Restrictions Weight Bearing Restrictions: No    See Function Navigator for Current Functional Status.   Therapy/Group: Individual Therapy  Luberta Mutter 04/26/2017, 12:10 PM

## 2017-04-27 ENCOUNTER — Inpatient Hospital Stay (HOSPITAL_COMMUNITY): Payer: PPO

## 2017-04-27 ENCOUNTER — Inpatient Hospital Stay (HOSPITAL_COMMUNITY): Payer: PPO | Admitting: Occupational Therapy

## 2017-04-27 ENCOUNTER — Inpatient Hospital Stay (HOSPITAL_COMMUNITY): Payer: PPO | Admitting: Speech Pathology

## 2017-04-27 DIAGNOSIS — E8809 Other disorders of plasma-protein metabolism, not elsewhere classified: Secondary | ICD-10-CM

## 2017-04-27 DIAGNOSIS — D62 Acute posthemorrhagic anemia: Secondary | ICD-10-CM

## 2017-04-27 DIAGNOSIS — E46 Unspecified protein-calorie malnutrition: Secondary | ICD-10-CM

## 2017-04-27 MED ORDER — PRO-STAT SUGAR FREE PO LIQD
30.0000 mL | Freq: Two times a day (BID) | ORAL | Status: DC
  Administered 2017-04-27 – 2017-05-11 (×29): 30 mL via ORAL
  Filled 2017-04-27 (×29): qty 30

## 2017-04-27 NOTE — Progress Notes (Signed)
Social Work Patient ID: Tara Dunn, female   DOB: 1940-09-07, 77 y.o.   MRN: [id number]  Met with pt and spoke with [name]-daughter to discuss team conference goals-supervision-min assist level and target discharge date 4/3. She voiced she will be there woth her Mom providing assist. Discussed having her come in prior to discharge to go through therapies and she can do this. Will work toward discharge 4/3.

## 2017-04-27 NOTE — Progress Notes (Signed)
Occupational Therapy Session Note  Patient Details  Name: Tara Dunn MRN: [id number] Date of Birth: [birth year]  Today's Date: 04/27/2017 OT Individual Time: 1434-1530 OT Individual Time Calculation (min): 56 min    Short Term Goals: Week 1:  OT Short Term Goal 1 (Week 1): Pt will complete 1/3 components of donning pants OT Short Term Goal 2 (Week 1): Pt will complete 1 grooming task in standing to improve balance OT Short Term Goal 3 (Week 1): Pt will complete 1/3 components of toileting   Skilled Therapeutic Interventions/Progress Updates:    Pt completed bathing and dressing sit to stand at the sink during session.  Supervision with min instructional cueing to complete doffing shirt, washing her UB, and then donning shirt again.  She needed mod assist for removing the right shoe and sock with supervision for washing her feet.  Mod assist for sit to stand with min assist for static standing balance while completing washing of peri area.  She does exhibit some difficulty maintaining right hip and knee extension when standing for a period of time.  Mod assist for donning shoes and socks.  Therapist provided makeshift stool for pt to prop her feet on when donning socks.  Supervision for donning left shoe with mod assist for right.  Finished session with static standing while working on word search puzzle.  Also had pt complete simple functional mobility with use of the RW for support to and from the bedside wheelchair to the sink.  Mod assist needed for advancement of the RLE secondary to weakness.  Pt's sister in at end of session.  Positioned pt in wheelchair with call button and phone in reach and chair alarm and belt in place.    Therapy Documentation Precautions:  Precautions Precautions: Fall Precaution Comments: Rt hemi Restrictions Weight Bearing Restrictions: No  Pain: Pain Assessment Pain Assessment: No/denies pain ADL:  See Function Navigator for Current Functional  Status.   Therapy/Group: Individual Therapy  Miyah Hampshire OTR/L 04/27/2017, 3:58 PM

## 2017-04-27 NOTE — Patient Care Conference (Signed)
Inpatient RehabilitationTeam Conference and Plan of Care Update Date: 04/27/2017   Time: 2:20 PM    Patient Name: Tara Dunn      Medical Record Number: [id number]  Date of Birth: 20-Mar-1940 Sex: Female         Room/Bed: 4M06C/4M06C-01 Payor Info: Payor: [employer] / Plan: [employer] Must / Product Type: *No Product type* /    Admitting Diagnosis: cva  Admit Date/Time:  04/22/2017  4:56 PM Admission Comments: No comment available   Primary Diagnosis:  Cerebrovascular accident (CVA) due to thrombosis of left middle cerebral artery ([place]) Principal Problem: Cerebrovascular accident (CVA) due to thrombosis of left middle cerebral artery [facility])  Patient Active Problem List   Diagnosis Date Noted  . Labile blood pressure   . Hypoalbuminemia due to protein-calorie malnutrition ([place])   . Acute blood loss anemia   . AKI (acute kidney injury) ([place])   . Peripheral edema   . Stage 3 chronic kidney disease ([place])   . Benign essential HTN   . History of CVA with residual deficit   . Cerebrovascular accident (CVA) due to thrombosis of left middle cerebral artery ([place])   . Right leg weakness 04/19/2017  . Right hemiparesis ([place]) 04/19/2017  . Thalamic infarct, acute ([place]) 04/19/2017  . Joint pain 12/09/2015  . ICD (implantable cardioverter-defibrillator) in place 03/26/2015  . Syncope 03/06/2015  . Obesity (BMI 30-39.9) 01/21/2015  . Excessive daytime sleepiness 08/04/2014  . OSA (obstructive sleep apnea) 08/04/2014  . Essential hypertension 06/13/2014  . Chronic systolic CHF (congestive heart failure) ([place]) 05/20/2014  . Congestive heart disease ([place])   . Chronic combined systolic and diastolic CHF (congestive heart failure) ([place]) 05/03/2014  . CKD (chronic kidney disease), stage III ([place]) 05/03/2014  . Hx of stroke without residual deficits 05/03/2014    Expected Discharge Date: Expected Discharge Date: 05/11/17  Team Members Present: Physician leading conference:  Dr. 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   Improve mobility, urinary incontinence, ABLA, diastolic CHF, CKD  See above   Bowel/Bladder   Incontinent bladder @ times, continent bowel, Last BM 04/25/17,   continent b&b  Remain continent bowel   Swallow/Nutrition/ Hydration             ADL's   Pt completes UB selfcare with supervision and LB selfcare sit to stand with mod assist.  Transfers are mod assist stand pivot with use of the RW for support  goals are supervision to min assist overall  selfcare retraining, balance retraining transfer training, functional mobility, neuromuscular re-education,, therapeutic exercise, DME education, pt/family education   Mobility   modA bed mobility, transfers and gait with rail, stairs NT  S bed mobility, minA transfers, gait, modA steps to enter home, car transfer  R NMR, activity tolerance, transfer/gait training   Communication   mild-moderate deficits for word finding and speech intelligibility   supervision   education and carryover of compensatory strategy use    Safety/Cognition/ Behavioral Observations  moderate deficits, mod assist overall   min assist   safety awareness, memory, problem solving, attention   Pain   PRN acetaminophen, gabapentin  300mg  TID  <4  assess pain qshift and PRN   Skin   no skin issues  remain infection/breakdown free  remain infection/breakdown free      *See Care Plan and progress notes for long and short-term goals.     Barriers to Discharge  Current Status/Progress Possible Resolutions Date  Resolved   Physician    Medical stability     See above  Therapies, follow weights, follow labs, timed toileting      Nursing                  PT                    OT                  SLP                SW                Discharge Planning/Teaching Needs:  HOme with daughter until can return to her apartment. Was staying with daughter prior to admission due to decline. Will confirm 24 hr care      Team Discussion:  Goals supervision-min assist level. Timed tolieting with nursing to see if can be more continent. Doesn't use call bell. Sit-stand hardest for her. Stopped lasix due to AKI and heart issues. Has some residual deficits from last stroke. Cognitive deficits will need 24 hr care upon discharge. To go to her daughter's home at discharge.  Revisions to Treatment Plan:  DC 4/3    Continued Need for Acute Rehabilitation Level of Care: The patient requires daily medical management by a physician with specialized training in physical medicine and rehabilitation for the following conditions: Daily direction of a multidisciplinary physical rehabilitation program to ensure safe treatment while eliciting the highest outcome that is of practical value to the patient.: Yes Daily medical management of patient stability for increased activity during participation in an intensive rehabilitation regime.: Yes Daily analysis of laboratory values and/or radiology reports with any subsequent need for medication adjustment of medical intervention for : Neurological problems;Renal problems;Cardiac problems  Elease Hashimoto 04/28/2017, 10:02 AM

## 2017-04-27 NOTE — Progress Notes (Signed)
Physical Therapy Session Note  Patient Details  Name: Concetta Guion MRN: [id number] Date of Birth: [birth year]  Today's Date: 04/27/2017 PT Individual Time: 0915-0945 PT Individual Time Calculation (min): 30 min   Short Term Goals: Week 1:  PT Short Term Goal 1 (Week 1): Pt will perform sit to stand with mod A x 1 with RW from wheelchair. PT Short Term Goal 2 (Week 1): Pt will transfer w/c to mat with mod A x 1 and LRAD. PT Short Term Goal 3 (Week 1): Pt will ambulate 10 ft with mod A x 1 with RW.  Skilled Therapeutic Interventions/Progress Updates:    Session focused on NMR for transitional movements for sit <> stands and weightshifting in standing for pre-gait. Pt requiring max assist for sit <> stands with multimodal cues for anterior weightshift and facilitation of upright trunk and hip extension. Pt demonstrates no eccentric control for stand -> sit despite cues. Mod to max assist to scoot back into the w/c for repositioning with poor pelvic dissociation noted.   Therapy Documentation Precautions:  Precautions Precautions: Fall Precaution Comments: Rt hemi Restrictions Weight Bearing Restrictions: No  Pain: Denies pain.   See Function Navigator for Current Functional Status.   Therapy/Group: Individual Therapy  Canary Brim Ivory Broad, PT, DPT  04/27/2017, 9:51 AM

## 2017-04-27 NOTE — Progress Notes (Signed)
[facility] PHYSICAL MEDICINE & REHABILITATION     PROGRESS NOTE    Subjective/Complaints: Pt seen lying in bed this AM.  He states he slept well overnight.    ROS: Denies CP, SOB, N/V/D  Objective: Vital Signs: Blood pressure 130/66, pulse 73, temperature 98.3 F (36.8 C), temperature source Oral, resp. rate 17, height 5\' 3"  (1.6 m), weight 102 kg (224 lb 13.9 oz), SpO2 96 %. No results found. Recent Labs    04/25/17 0420  WBC 3.9*  HGB 10.4*  HCT 32.4*  PLT 171   Recent Labs    04/25/17 0420  NA 135  K 5.0  CL 103  GLUCOSE 106*  BUN 53*  CREATININE 1.93*  CALCIUM 10.4*   CBG (last 3)  No results for input(s): GLUCAP in the last 72 hours.  Wt Readings from Last 3 Encounters:  04/27/17 102 kg (224 lb 13.9 oz)  04/21/17 99 kg (218 lb 4.1 oz)  04/15/17 100.2 kg (221 lb)    Physical Exam:  Constitutional: No distress . Vital signs reviewed. HENT: Normocephalic, atraumatic Eyes: EOMI, No discharge Cardiovascular: RRR. No JVD    Respiratory: CTA Bilaterally. Normal effort    GI: BS +, non-distended  Musc: No edema or tenderness in extremities Neurological: Alertand oriented  Right central 7 with dysarthria reasonable insight and awareness.   Follows basic commands.   Motor: B/l UE grossly 4/5 prox to distal (left stronger than right) RLE: 2-/5 prox to distal LLE 2+/5 prox to distal.   Skin. Warm and dry Psych: Pleasant and cooperative    Assessment/Plan: 1.  Functional and mobility deficits secondary to left posterior limb internal capsule infarct which require 3+ hours per day of interdisciplinary therapy in a comprehensive inpatient rehab setting. Physiatrist is providing close team supervision and 24 hour management of active medical problems listed below. Physiatrist and rehab team continue to assess barriers to discharge/monitor patient progress toward functional and medical goals.  Function:  Bathing Bathing position   Position:  Wheelchair/chair at sink  Bathing parts Body parts bathed by patient: Right arm, Left arm, Chest, Abdomen, Front perineal area, Right upper leg, Left upper leg, Right lower leg, Left lower leg Body parts bathed by helper: Back, Buttocks  Bathing assist Assist Level: (Mod A)      Upper Body Dressing/Undressing Upper body dressing   What is the patient wearing?: Pull over shirt/dress     Pull over shirt/dress - Perfomed by patient: Thread/unthread right sleeve, Thread/unthread left sleeve, Put head through opening, Pull shirt over trunk Pull over shirt/dress - Perfomed by helper: Pull shirt over trunk        Upper body assist Assist Level: Touching or steadying assistance(Pt > 75%)      Lower Body Dressing/Undressing Lower body dressing   What is the patient wearing?: Pants, Socks     Pants- Performed by patient: Thread/unthread left pants leg Pants- Performed by helper: Thread/unthread right pants leg, Pull pants up/down       Socks - Performed by helper: Don/doff left sock, Don/doff right sock   Shoes - Performed by helper: Don/doff right shoe, Don/doff left shoe, Fasten right, Fasten left          Lower body assist Assist for lower body dressing: (maxA)      Toileting Toileting Toileting activity did not occur: No continent bowel/bladder event Toileting steps completed by patient: Adjust clothing prior to toileting Toileting steps completed by helper: Performs perineal hygiene, Adjust clothing after toileting Toileting  Assistive Devices: Toilet aid  Toileting assist Assist level: More than reasonable time   Transfers Chair/bed transfer   Chair/bed transfer method: Stand pivot Chair/bed transfer assist level: Touching or steadying assistance (Pt > 75%) Chair/bed transfer assistive device: Armrests     Locomotion Ambulation     Max distance: 35 Assist level: Touching or steadying assistance (Pt > 75%)   Wheelchair   Type: Manual Max wheelchair distance:  00 Assist Level: Touching or steadying assistance (Pt > 75%)  Cognition Comprehension Comprehension assist level: Follows basic conversation/direction with extra time/assistive device  Expression Expression assist level: Expresses basic 75 - 89% of the time/requires cueing 10 - 24% of the time. Needs helper to occlude trach/needs to repeat words.  Social Interaction Social Interaction assist level: Interacts appropriately 90% of the time - Needs monitoring or encouragement for participation or interaction.  Problem Solving Problem solving assist level: Solves basic 75 - 89% of the time/requires cueing 10 - 24% of the time  Memory Memory assist level: Recognizes or recalls 50 - 74% of the time/requires cueing 25 - 49% of the time  Medical Problem List and Plan: 1.Right side weakness and dysarthriasecondary to lacunar infarction posterior limb ofleftinternal capsule secondary to small vessel disease. Plan aspirin and Plavix 3 weeks then Plavix alone Continue CIR    Notes reviewed, images reviewed, labs reviewed 2. DVT Prophylaxis/Anticoagulation: started heparin, d/ced SCD. Monitor for any signs of DVT 3. Pain Management:Neurontin 300 mg 3 times a day, Tylenol as needed 4. Mood:Provide emotional support 5. Neuropsych: This patientiscapable of making decisions on herown behalf. 6. Skin/Wound Care:Routine skin checks 7. Fluids/Electrolytes/Nutrition:Routine I&O's  8.Chronic combined systolic and diastolic congestive heart failure.   -EJ FX 45-50% with Gr 1 DD  Filed Weights   04/25/17 0500 04/26/17 0558 04/27/17 0554  Weight: 101.3 kg (223 lb 5.2 oz) 101.4 kg (223 lb 8.7 oz) 102 kg (224 lb 13.9 oz)    Lasix d/ced 9.Cardiomyopathy with AICD. Follow-up cardiology services    Stable daily weights as above 10.Hypertension. Coreg 25 mg twice a day,BIDIL20-37.45milligrams twice a day Vitals:   04/27/17 0554 04/27/17 0900  BP: (!) 119/53 130/66  Pulse: 83 73  Resp:  17   Temp: 98.3 F (36.8 C)   SpO2: 96%    Overall controlled on 3/20 11.Chronic right lower extremity edema.  .  12. CKDstage III. Baseline creatinine 1.69.   Cr 1.93 on 3/18   Encourage fluids 13.Hyperlipidemia. Lipitor 14.GERD. Protonix 15. ABLA   Hb 10.4 on 3/18   Cont to monitor 16. Hypoalbuminemia   Supplement initiated on 3/20    LOS (Days) 5 A FACE TO FACE EVALUATION WAS PERFORMED  Tara Litt Lorie Phenix, MD 04/27/2017 10:09 AM

## 2017-04-27 NOTE — Plan of Care (Signed)
  Progressing Spiritual Needs Ability to function at adequate level 04/27/2017 1535 - Progressing by Claude Manges, LPN RH BOWEL ELIMINATION RH STG MANAGE BOWEL WITH ASSISTANCE Description STG Manage Bowel with Assistance. 04/27/2017 1535 - Progressing by Claude Manges, LPN RH STG MANAGE BOWEL W/MEDICATION W/ASSISTANCE Description STG Manage Bowel with Medication with Assistance. 04/27/2017 1535 - Progressing by Claude Manges, LPN RH BLADDER ELIMINATION RH STG MANAGE BLADDER WITH ASSISTANCE Description STG Manage Bladder With Assistance. Min  04/27/2017 1535 - Progressing by Claude Manges, LPN RH SKIN INTEGRITY RH STG SKIN FREE OF INFECTION/BREAKDOWN Description Skin free of breakdown with min assist  04/27/2017 1535 - Progressing by Claude Manges, LPN RH STG MAINTAIN SKIN INTEGRITY WITH ASSISTANCE Description STG Maintain Skin Integrity With Assistance. min  04/27/2017 1535 - Progressing by Claude Manges, LPN Consults [facility] STROKE PATIENT EDUCATION Description See Patient Education module for education specifics  04/27/2017 1535 - Progressing by Claude Manges, LPN

## 2017-04-27 NOTE — Progress Notes (Signed)
Physical Therapy Note  Patient Details  Name: Tara Dunn MRN: [id number] Date of Birth: 1940-09-19 Today's Date: 04/27/2017  1330-1415, 45 min indiviidual tx Pain: none  W/c propulsion using hemi method with LUE and LLE with min assist for steering, on level tile.   neuromuscular re-education via demo, multimodal cues for pelvic dissociation for scooting forward in w/c and on raised mat; use of bil UEs on stable surface in front to facilitate wt shifting, in order to elevate hips from mat.  Sit>< stand blocked practice, focusing on scooting, forward wt shift, bil foot position, and cervical flexion, achieving supervision to stand to RW, and min guard, mod cues for stand/step transfer to w/c to L.   Pt left resting in w/c with quick release belt donned and seat alarm set.   See function navigator for current status.  [name] 04/27/2017, 1:24 PM

## 2017-04-27 NOTE — Progress Notes (Signed)
Speech Language Pathology Daily Session Note  Patient Details  Name: Loreal Schuessler MRN: [id number] Date of Birth: 1940/03/13  Today's Date: 04/27/2017 SLP Individual Time: 0800-0858 SLP Individual Time Calculation (min): 58 min  Short Term Goals: Week 1: SLP Short Term Goal 1 (Week 1): Patient will utilize a slow rate and over-articulation at the sentence level to achieve ~90% intelligibility with Min A verbal cues.  SLP Short Term Goal 2 (Week 1): Patient will complete higher-level naming/word-finding tasks with Min A multimodal cues.  SLP Short Term Goal 3 (Week 1): Patient will demonstrate functional problem solving for basic and familiar tasks with Mod A verbal cues.  SLP Short Term Goal 4 (Week 1): Patient will identify 2 physical and 2 cognitive deficits with Mod A verbal cues.  SLP Short Term Goal 5 (Week 1): Patient will utilize external memory aids to recall new, daily information with Mod A verbal and visual cues.  SLP Short Term Goal 6 (Week 1): Patient will demonstrate selective attention to functional tasks in a mildly distracting enviornment for ~30 minutes with supervision verbal cues for redirection.   Skilled Therapeutic Interventions:   Pt was seen for skilled ST targeting cognitive goals.  SLP facilitated the session with a novel card game targeting memory, selective attention, and problem solving goals.  Pt completed task with max faded to min assist verbal cues for working memory of task rules and procedures in order to plan and execute a problem solving strategy.  Pt selectively attended to task in a moderately distracting environment for 30 minutes with supervision cues for redirection.  Pt had difficulty recall speech intelligibility strategies covered in yesterday's therapy session; therefore, SLP provided pt with a written list of strategies to maximize carryover in between therapies.  Pt was returned to room and left in wheelchair with quick release belt donned, chair  alarm set, and call bell within reach.  Continue per current plan of care.     Function:  Eating Eating                 Cognition Comprehension Comprehension assist level: Follows basic conversation/direction with extra time/assistive device  Expression   Expression assist level: Expresses basic 75 - 89% of the time/requires cueing 10 - 24% of the time. Needs helper to occlude trach/needs to repeat words.  Social Interaction Social Interaction assist level: Interacts appropriately 90% of the time - Needs monitoring or encouragement for participation or interaction.  Problem Solving Problem solving assist level: Solves basic 75 - 89% of the time/requires cueing 10 - 24% of the time  Memory Memory assist level: Recognizes or recalls 50 - 74% of the time/requires cueing 25 - 49% of the time    Pain Pain Assessment Pain Assessment: No/denies pain  Therapy/Group: Individual Therapy  Afnan Emberton, Selinda Orion 04/27/2017, 8:58 AM

## 2017-04-28 ENCOUNTER — Inpatient Hospital Stay (HOSPITAL_COMMUNITY): Payer: PPO | Admitting: Physical Therapy

## 2017-04-28 ENCOUNTER — Inpatient Hospital Stay (HOSPITAL_COMMUNITY): Payer: PPO | Admitting: Speech Pathology

## 2017-04-28 ENCOUNTER — Other Ambulatory Visit: Payer: Self-pay | Admitting: Licensed Clinical Social Worker

## 2017-04-28 ENCOUNTER — Inpatient Hospital Stay (HOSPITAL_COMMUNITY): Payer: PPO | Admitting: Occupational Therapy

## 2017-04-28 DIAGNOSIS — R0989 Other specified symptoms and signs involving the circulatory and respiratory systems: Secondary | ICD-10-CM

## 2017-04-28 NOTE — Progress Notes (Signed)
[facility] PHYSICAL MEDICINE & REHABILITATION     PROGRESS NOTE    Subjective/Complaints: Patient seen lying in bed this morning. She states she slept well overnight. She asked me to raise the head of her bed.  ROS: Denies CP, SOB, N/V/D  Objective: Vital Signs: Blood pressure (!) 126/58, pulse 75, temperature 98 F (36.7 C), temperature source Oral, resp. rate 15, height 5\' 3"  (1.6 m), weight 98.8 kg (217 lb 13 oz), SpO2 99 %. No results found. No results for input(s): WBC, HGB, HCT, PLT in the last 72 hours. No results for input(s): NA, K, CL, GLUCOSE, BUN, CREATININE, CALCIUM in the last 72 hours.  Invalid input(s): CO CBG (last 3)  No results for input(s): GLUCAP in the last 72 hours.  Wt Readings from Last 3 Encounters:  04/28/17 98.8 kg (217 lb 13 oz)  04/21/17 99 kg (218 lb 4.1 oz)  04/15/17 100.2 kg (221 lb)    Physical Exam:  Constitutional: No distress . Vital signs reviewed. HENT: Normocephalic, atraumatic Eyes: EOMI, No discharge Cardiovascular: RRR. No JVD    Respiratory: CTA Bilaterally. Normal effort    GI: BS +, non-distended  Musc: No edema or tenderness in extremities Neurological: Alertand oriented  Right central 7 with dysarthria reasonable insight and awareness.   Follows basic commands.   Motor: B/l UE grossly 4/5 prox to distal (left stronger than right) RLE: 3+/5 prox to distal LLE 4-/5 prox to distal.   Skin. Warm and dry Psych: Pleasant and cooperative   Assessment/Plan: 1.  Functional and mobility deficits secondary to left posterior limb internal capsule infarct which require 3+ hours per day of interdisciplinary therapy in a comprehensive inpatient rehab setting. Physiatrist is providing close team supervision and 24 hour management of active medical problems listed below. Physiatrist and rehab team continue to assess barriers to discharge/monitor patient progress toward functional and medical goals.  Function:  Bathing Bathing  position   Position: Wheelchair/chair at sink  Bathing parts Body parts bathed by patient: Right arm, Left arm, Chest, Abdomen, Right upper leg, Left upper leg, Right lower leg, Left lower leg Body parts bathed by helper: Back, Buttocks  Bathing assist Assist Level: (Mod A)      Upper Body Dressing/Undressing Upper body dressing   What is the patient wearing?: Button up shirt     Pull over shirt/dress - Perfomed by patient: Thread/unthread right sleeve, Thread/unthread left sleeve, Put head through opening, Pull shirt over trunk Pull over shirt/dress - Perfomed by helper: Pull shirt over trunk Button up shirt - Perfomed by patient: Thread/unthread right sleeve, Thread/unthread left sleeve, Button/unbutton shirt Button up shirt - Perfomed by helper: Pull shirt around back    Upper body assist Assist Level: Touching or steadying assistance(Pt > 75%)      Lower Body Dressing/Undressing Lower body dressing   What is the patient wearing?: Pants, Socks     Pants- Performed by patient: Pull pants up/down Pants- Performed by helper: Thread/unthread right pants leg, Thread/unthread left pants leg     Socks - Performed by patient: Don/doff left sock Socks - Performed by helper: Don/doff right sock, Don/doff left sock   Shoes - Performed by helper: Don/doff right shoe, Don/doff left shoe          Lower body assist Assist for lower body dressing: (maxA)      Toileting Toileting Toileting activity did not occur: No continent bowel/bladder event Toileting steps completed by patient: Adjust clothing prior to toileting Toileting steps completed  by helper: Performs perineal hygiene, Adjust clothing after toileting Toileting Assistive Devices: Grab bar or rail  Toileting assist Assist level: More than reasonable time   Transfers Chair/bed transfer   Chair/bed transfer method: Stand pivot Chair/bed transfer assist level: Touching or steadying assistance (Pt > 75%) Chair/bed transfer  assistive device: Armrests     Locomotion Ambulation     Max distance: 7' Assist level: Moderate assist (Pt 50 - 74%)   Wheelchair   Type: Manual Max wheelchair distance: 60 Assist Level: Touching or steadying assistance (Pt > 75%)  Cognition Comprehension Comprehension assist level: Follows basic conversation/direction with extra time/assistive device  Expression Expression assist level: Expresses basic 75 - 89% of the time/requires cueing 10 - 24% of the time. Needs helper to occlude trach/needs to repeat words.  Social Interaction Social Interaction assist level: Interacts appropriately 75 - 89% of the time - Needs redirection for appropriate language or to initiate interaction.  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 Plan aspirin and Plavix 3 weeks then Plavix alone Continue CIR  2. DVT Prophylaxis/Anticoagulation: started heparin, d/ced SCD. Monitor for any signs of DVT 3. Pain Management:Neurontin 300 mg 3 times a day, Tylenol as needed 4. Mood:Provide emotional support 5. Neuropsych: This patientiscapable of making decisions on herown behalf. 6. Skin/Wound Care:Routine skin checks 7. Fluids/Electrolytes/Nutrition:Routine I&O's  8.Chronic combined systolic and diastolic congestive heart failure.   -EJ FX 45-50% with Gr 1 DD  Filed Weights   04/26/17 0558 04/27/17 0554 04/28/17 0000  Weight: 101.4 kg (223 lb 8.7 oz) 102 kg (224 lb 13.9 oz) 98.8 kg (217 lb 13 oz)    Lasix d/ced 9.Cardiomyopathy with AICD. Follow-up cardiology services    Stable daily weights as above 10.Hypertension. Coreg 25  mg twice a day,BIDIL20-37.50milligrams twice a day Vitals:   04/27/17 1819 04/28/17 0000  BP: (!) 142/64 (!) 126/58  Pulse: 70 75  Resp:  15  Temp:  98 F (36.7 C)  SpO2:  99%   Slightly labile, but overall controlled on 3/21 11.Chronic right lower extremity edema.  .  12. CKDstage III. Baseline creatinine 1.69.   Cr 1.93 on 3/18   Encourage fluids 13.Hyperlipidemia. Lipitor 14.GERD. Protonix 15. ABLA   Hb 10.4 on 3/18   Cont to monitor 16. Hypoalbuminemia   Supplement initiated on 3/20   LOS (Days) 6 A FACE TO FACE EVALUATION WAS PERFORMED  Shankar Silber Lorie Phenix, MD 04/28/2017 9:18 AM

## 2017-04-28 NOTE — Progress Notes (Signed)
Physical Therapy Session Note  Patient Details  Name: Tara Dunn MRN: [id number] Date of Birth: 04-21-1940  Today's Date: 04/28/2017 PT Individual Time: 1000-1100 PT Individual Time Calculation (min): 60 min   Short Term Goals: Week 1:  PT Short Term Goal 1 (Week 1): Pt will perform sit to stand with mod A x 1 with RW from wheelchair. PT Short Term Goal 2 (Week 1): Pt will transfer w/c to mat with mod A x 1 and LRAD. PT Short Term Goal 3 (Week 1): Pt will ambulate 10 ft with mod A x 1 with RW.  Skilled Therapeutic Interventions/Progress Updates: Pt received seated in w/c, denies pain and agreeable to treatment. B socks and shoes donned totalA for energy/time conservation. W/c propulsion BUE x100' for RUE strengthening and coordination; min cues for technique and increased RUE efficiency. Sit <>stand x5 from w/c with RW and minA>S with repetition; improved technique when LEs positioned correctly (feet back towards pt, hips scooted forward on chair) which requires mod cueing for pt to recall and perform. Gait x30' with RW and min guard; improved RLE progression with only occasional cues for RLE placement during turns. Standing BLE gastroc stretch on red wedge 2x1 min, 2x15 reps heel raises on wedge. Attempted standing performing puzzle with alternating UEs while standing on wedge, however pt unable to maintain balance, frequently lowering into minisquat to maintain balance. Completed puzzle in sitting with min cues for problem solving. Standing alternating toe taps to 1" step with AAROM initially for R hip/knee flexion, progressed to AROM with repettition. Gait with RW and min guard x50' with min cues for upright posture and R step length. Improved R foot clearance noted. Remained seated in w/c at end of session, chair alarm and quick release belt intact, all needs in reach.      Therapy Documentation Precautions:  Precautions Precautions: Fall Precaution Comments: Rt  hemi Restrictions Weight Bearing Restrictions: No   See Function Navigator for Current Functional Status.   Therapy/Group: Individual Therapy  Luberta Mutter 04/28/2017, 10:59 AM

## 2017-04-28 NOTE — Progress Notes (Signed)
Speech Language Pathology Daily Session Note  Patient Details  Name: Tara Dunn MRN: [id number] Date of Birth: 1940-12-10  Today's Date: 04/28/2017 SLP Individual Time: 0805-0900 SLP Individual Time Calculation (min): 55 min  Short Term Goals: Week 1: SLP Short Term Goal 1 (Week 1): Patient will utilize a slow rate and over-articulation at the sentence level to achieve ~90% intelligibility with Min A verbal cues.  SLP Short Term Goal 2 (Week 1): Patient will complete higher-level naming/word-finding tasks with Min A multimodal cues.  SLP Short Term Goal 3 (Week 1): Patient will demonstrate functional problem solving for basic and familiar tasks with Mod A verbal cues.  SLP Short Term Goal 4 (Week 1): Patient will identify 2 physical and 2 cognitive deficits with Mod A verbal cues.  SLP Short Term Goal 5 (Week 1): Patient will utilize external memory aids to recall new, daily information with Mod A verbal and visual cues.  SLP Short Term Goal 6 (Week 1): Patient will demonstrate selective attention to functional tasks in a mildly distracting enviornment for ~30 minutes with supervision verbal cues for redirection.   Skilled Therapeutic Interventions:   Pt was seen for skilled ST targeting cognitive goals.  Pt had been incontinent upon therapist's arrival and had not initiated calling the nurse.  Pt reports urgency and does not call nurse in time to get to the bedside commode.  SLP discussed anticipating toileting needs and calling the nurse before feeling the urge to void.  A schedule of toileting times was posted in pt's room to facilitate carryover in between therapy sessions.  SLP facilitated the session with a novel organization task in a moderately distracting environment to address selective attention goals.  Pt completed task with supervision cues to recognize and correct errors.  She selectively attended to task in a moderately distracting environment for ~10 minutes with no cues  needed for redirection.  SLP also facilitated the session with a error recognition task to address problem solving goals.  Pt needed mod assist verbal cues to recognize and correct ~80% of errors.  Pt was returned to room and left in xxx with call bell within reach.  Continue per current plan of care.     Function:  Eating Eating                 Cognition Comprehension Comprehension assist level: Follows basic conversation/direction with extra time/assistive device  Expression   Expression assist level: Expresses basic 75 - 89% of the time/requires cueing 10 - 24% of the time. Needs helper to occlude trach/needs to repeat words.  Social Interaction Social Interaction assist level: Interacts appropriately 75 - 89% of the time - Needs redirection for appropriate language or to initiate interaction.  Problem Solving Problem solving assist level: Solves basic 75 - 89% of the time/requires cueing 10 - 24% of the time  Memory Memory assist level: Recognizes or recalls 75 - 89% of the time/requires cueing 10 - 24% of the time    Pain Pain Assessment Pain Scale: Faces Faces Pain Scale: Hurts a little bit Pain Type: Acute pain Pain Location: Hip Pain Orientation: Right Pain Descriptors / Indicators: Aching Pain Intervention(s): Repositioned  Therapy/Group: Individual Therapy  Zamier Eggebrecht, Selinda Orion 04/28/2017, 9:13 AM

## 2017-04-28 NOTE — Progress Notes (Signed)
Occupational Therapy Session Note  Patient Details  Name: Tara Dunn MRN: [id number] Date of Birth: April 22, 1940  Today's Date: 04/28/2017 OT Individual Time: 7017-7939 OT Individual Time Calculation (min): 73 min    Short Term Goals: Week 1:  OT Short Term Goal 1 (Week 1): Pt will complete 1/3 components of donning pants OT Short Term Goal 2 (Week 1): Pt will complete 1 grooming task in standing to improve balance OT Short Term Goal 3 (Week 1): Pt will complete 1/3 components of toileting   Skilled Therapeutic Interventions/Progress Updates:    Pt completed bathing and dressing sit to stand during session.  Had pt perform stand pivot transfer to the tub bench from the wheelchair with use of the RW for support.  She was able to complete this with min assist using the RW for support.  Mod assist needed for removal of LB clothing including shoes and socks.  She was able to then complete bathing with overall min assist sit to stand, with use of rails to help with standing.  She was able to complete dressing sit to stand with min assist as well from the wheelchair.  Increased time and mod instructional cueing needed to remember to scoot forward to the edge of the wheelchair and to push up from the wheelchair arms.  Decreased ability to complete buttons overall with therapist having to assist with 75% of them on her button up shirt.  Issued her a cup with pennies to work on picking up and manipulating in hand with the RUE for [place] coordination.  Finished session with pt in wheelchair at bedside with call button and phone in reach and safety belt in place.    Therapy Documentation Precautions:  Precautions Precautions: Fall Precaution Comments: Rt hemi Restrictions Weight Bearing Restrictions: No  Pain: Pain Assessment Pain Score: 0-No pain ADL: See Function Navigator for Current Functional Status.   Therapy/Group: Individual Therapy  Tameria Patti OTR/L 04/28/2017, 3:44 PM

## 2017-04-28 NOTE — Plan of Care (Signed)
  Problem: RH Balance Goal: LTG Patient will maintain dynamic standing balance (PT) Description LTG:  Patient will maintain dynamic standing balance with assistance during mobility activities (PT) Flowsheets (Taken 04/28/2017 1100) LTG: Pt will maintain dynamic standing balance during mobility activities with:: 5 - Supervision/cueing (upgraded d/t progress) Note:  Upgraded d/t progress    Problem: RH Bed to Chair Transfers Goal: LTG Patient will perform bed/chair transfers w/assist (PT) Description LTG: Patient will perform bed/chair transfers with assistance, with/without cues (PT). Flowsheets (Taken 04/28/2017 1100) LTG: Pt will perform Bed to Chair Transfers with/without cues and assist: : 5 - Supervision/cueing (upgraded d/t progress) Note:  Upgraded d/t progress   Problem: RH Car Transfers Goal: LTG Patient will perform car transfers with assist (PT) Description LTG: Patient will perform car transfers with assistance (PT). Flowsheets (Taken 04/28/2017 1100) LTG: Pt will perform car transfers with assist:: 4 - Minimal Assistance (upgraded d/t progress)   Problem: RH Ambulation Goal: LTG Patient will ambulate in controlled environment (PT) Description LTG: Patient will ambulate in a controlled environment, # of feet with assistance (PT). Flowsheets (Taken 04/28/2017 1100) LTG: Pt will ambulate in controlled environ  assist needed:: 5 - Supervision/cueing (upgraded d/t progress) LTG: Ambulation distance in controlled environment: 65' Note:  Upgraded d/t progress  Goal: LTG Patient will ambulate in home environment (PT) Description LTG: Patient will ambulate in home environment, # of feet with assistance (PT). Flowsheets (Taken 04/28/2017 1100) LTG: Pt will ambulate in home environ  assist needed:: 5 - Supervision/cueing (upgraded d/t progress) Note:  Upgraded d/t progress

## 2017-04-28 NOTE — Patient Outreach (Signed)
[facility]) Care Management  04/28/2017  Tara Dunn 15-Mar-1940 [id number]  Assessment- CSW received new referral on patient from [place] who was previously active with patient before her recent hospitalization and transfer to [facility] inpatient rehab. CSW completed call covering inpatient rehab SW Jacqlyn Larsen [place] at number  [phone] but was unable to reach her successfully. Voice message left requesting a return call with any discharge plans or updates on patient.  Plan-CSW will await for return call from SNF social worker.  Tara Dunn, BSW, MSW, [name] Phone: [phone] Fax: [phone]

## 2017-04-29 ENCOUNTER — Inpatient Hospital Stay (HOSPITAL_COMMUNITY): Payer: PPO

## 2017-04-29 ENCOUNTER — Inpatient Hospital Stay (HOSPITAL_COMMUNITY): Payer: PPO | Admitting: Physical Therapy

## 2017-04-29 ENCOUNTER — Other Ambulatory Visit: Payer: Self-pay | Admitting: Licensed Clinical Social Worker

## 2017-04-29 ENCOUNTER — Inpatient Hospital Stay (HOSPITAL_COMMUNITY): Payer: PPO | Admitting: Occupational Therapy

## 2017-04-29 ENCOUNTER — Inpatient Hospital Stay (HOSPITAL_COMMUNITY): Payer: PPO | Admitting: Speech Pathology

## 2017-04-29 NOTE — Progress Notes (Signed)
Physical Therapy Session Note  Patient Details  Name: Tara Dunn MRN: [id number] Date of Birth: 09-26-1940  Today's Date: 04/29/2017 PT Individual Time: 2947-6546 PT Individual Time Calculation (min): 55 min   Short Term Goals: Week 1:  PT Short Term Goal 1 (Week 1): Pt will perform sit to stand with mod A x 1 with RW from wheelchair. PT Short Term Goal 2 (Week 1): Pt will transfer w/c to mat with mod A x 1 and LRAD. PT Short Term Goal 3 (Week 1): Pt will ambulate 10 ft with mod A x 1 with RW.  Skilled Therapeutic Interventions/Progress Updates:   Pt in w/c and agreeable to therapy, denies pain. Total assist w/c transport to gym and worked on sit<>stands from w/c w/ min assist fading to close supervision and performing dynamic standing tasks w/ BUEs. Encouraged pt to rely on LEs for balance w/ close supervision. Ambulated 49' x2 w/ RW and min guard for safety. Focused on endurance and quality of gait. Verbal and visual cues for gait pattern, emphasizing increased foot clearance bilaterally w/ increased hip and knee flexion. Pt reporting increased fatigue and elected to self-propel w/c back to room vs ambulate. Worked on overall endurance and UE strengthening w/ w/c propulsion. Able to self-propel w/ UEs in 50' bouts w/ min assist to steer as pt unable to compensate for RUE weakness. Returned to room and ended session in w/c, call bell within reach and all needs met. Chair alarm activated.   Therapy Documentation Precautions:  Precautions Precautions: Fall Precaution Comments: Rt hemi Restrictions Weight Bearing Restrictions: No Pain: Pain Assessment Pain Scale: 0-10 Pain Score: 0-No pain  See Function Navigator for Current Functional Status.   Therapy/Group: Individual Therapy  Rozelia Catapano K Arnette 04/29/2017, 2:59 PM

## 2017-04-29 NOTE — Progress Notes (Signed)
Physical Therapy Session Note  Patient Details  Name: Jaela Yepez MRN: [id number] Date of Birth: 07/20/1940  Today's Date: 04/29/2017 PT Individual Time: 1600-1630 PT Individual Time Calculation (min): 30 min   Short Term Goals: Week 1:  PT Short Term Goal 1 (Week 1): Pt will perform sit to stand with mod A x 1 with RW from wheelchair. PT Short Term Goal 2 (Week 1): Pt will transfer w/c to mat with mod A x 1 and LRAD. PT Short Term Goal 3 (Week 1): Pt will ambulate 10 ft with mod A x 1 with RW.  Skilled Therapeutic Interventions/Progress Updates:    Pt seated in w/c upon PT arrival, agreeable to therapy tx and denies pain. Pt transported from room>gym in w/c. Pt performed sit<>stand with min assist, verbal cues for set up. Pt ambulated x 85 ft with RW and min assist, tactile cues for hip extension, verbal cues for toe clearance. Pt worked on dynamic standing balance without UE support to perform card matching activity, x 2 trials. Pt propelled w/c back to room and left seated in w/c with needs in reach and chair alarm set.   Therapy Documentation Precautions:  Precautions Precautions: Fall Precaution Comments: Rt hemi Restrictions Weight Bearing Restrictions: No   See Function Navigator for Current Functional Status.   Therapy/Group: Individual Therapy  Netta Corrigan, PT, DPT 04/29/2017, 4:17 PM

## 2017-04-29 NOTE — Progress Notes (Signed)
Occupational Therapy Session Note  Patient Details  Name: Tara Dunn MRN: [id number] Date of Birth: 1940/07/25  Today's Date: 04/29/2017 OT Individual Time: 1045-1200 OT Individual Time Calculation (min): 75 min    Short Term Goals: Week 1:  OT Short Term Goal 1 (Week 1): Pt will complete 1/3 components of donning pants OT Short Term Goal 2 (Week 1): Pt will complete 1 grooming task in standing to improve balance OT Short Term Goal 3 (Week 1): Pt will complete 1/3 components of toileting   Skilled Therapeutic Interventions/Progress Updates:    Pt seen for BADL retraining with a focus on functional mobility techniques and balance.  Pt agreeable to a shower.  Pt tried to stand with one hand on RW and one on w/c and feet too far forward.  When cued to push up with both hands and keep feet back, pt was able to stand to RW with only slight steadying A.  Pt was able to do this numerous several times during the session as she ambulated with RW to toilet and then to shower, in the shower, back to w/c, during LB dressing.   She used a long sponge to reach her feet more easily.  She donned pants over feet but with some difficulty.  Pt was not able to reach for socks but she was able to do so partially with the sock aid. Provided pt with elastic shoe laces. Pt will need a shoe horn to increase ease with donning shoes.  Pt resting in w/c ready for lunch with chair alarm set.  Therapy Documentation Precautions:  Precautions Precautions: Fall Precaution Comments: Rt hemi Restrictions Weight Bearing Restrictions: No    Pain: Pain Assessment Pain Scale: 0-10 Pain Score: 0-No pain ADL: ADL ADL Comments: Please see functional navigator for ADL status  See Function Navigator for Current Functional Status.   Therapy/Group: Individual Therapy  SAGUIER,JULIA 04/29/2017, 12:58 PM

## 2017-04-29 NOTE — Progress Notes (Signed)
[place] PHYSICAL MEDICINE & REHABILITATION     PROGRESS NOTE    Subjective/Complaints: Patient seen lying in bed this morning. She states she slept well overnight.  ROS: Denies CP, SOB, N/V/D  Objective: Vital Signs: Blood pressure (!) 141/77, pulse 66, temperature 98.4 F (36.9 C), temperature source Oral, resp. rate 18, height 5\' 3"  (1.6 m), weight 99.2 kg (218 lb 11.1 oz), SpO2 100 %. No results found. No results for input(s): WBC, HGB, HCT, PLT in the last 72 hours. No results for input(s): NA, K, CL, GLUCOSE, BUN, CREATININE, CALCIUM in the last 72 hours.  Invalid input(s): CO CBG (last 3)  No results for input(s): GLUCAP in the last 72 hours.  Wt Readings from Last 3 Encounters:  04/29/17 99.2 kg (218 lb 11.1 oz)  04/21/17 99 kg (218 lb 4.1 oz)  04/15/17 100.2 kg (221 lb)    Physical Exam:  Constitutional: No distress . Vital signs reviewed. HENT: Normocephalic, atraumatic Eyes: EOMI, No discharge Cardiovascular: RRR. No JVD    Respiratory: CTA Bilaterally. Normal effort    GI: BS +, non-distended  Musc: No edema or tenderness in extremities Neurological: Alertand oriented  Right central 7 with dysarthria reasonable insight and awareness.   Follows basic commands.   Motor: B/l UE grossly 4/5 prox to distal (left stronger than right) RLE: 3-/5 prox to distal LLE 3+/5 prox to distal.   Skin. Warm and dry Psych: Pleasant and cooperative   Assessment/Plan: 1.  Functional and mobility deficits secondary to left posterior limb internal capsule infarct which require 3+ hours per day of interdisciplinary therapy in a comprehensive inpatient rehab setting. Physiatrist is providing close team supervision and 24 hour management of active medical problems listed below. Physiatrist and rehab team continue to assess barriers to discharge/monitor patient progress toward functional and medical goals.  Function:  Bathing Bathing position   Position: Shower  Bathing  parts Body parts bathed by patient: Right arm, Left arm, Chest, Abdomen, Right upper leg, Left upper leg, Right lower leg, Left lower leg, Buttocks Body parts bathed by helper: Back  Bathing assist Assist Level: Touching or steadying assistance(Pt > 75%)      Upper Body Dressing/Undressing Upper body dressing   What is the patient wearing?: Button up shirt     Pull over shirt/dress - Perfomed by patient: Thread/unthread right sleeve, Thread/unthread left sleeve, Put head through opening, Pull shirt over trunk Pull over shirt/dress - Perfomed by helper: Pull shirt over trunk Button up shirt - Perfomed by patient: Thread/unthread right sleeve, Thread/unthread left sleeve, Pull shirt around back Button up shirt - Perfomed by helper: Button/unbutton shirt    Upper body assist Assist Level: Touching or steadying assistance(Pt > 75%)      Lower Body Dressing/Undressing Lower body dressing   What is the patient wearing?: Pants, Socks, Shoes     Pants- Performed by patient: Thread/unthread right pants leg, Thread/unthread left pants leg, Pull pants up/down Pants- Performed by helper: Thread/unthread right pants leg, Thread/unthread left pants leg     Socks - Performed by patient: Don/doff left sock Socks - Performed by helper: Don/doff right sock, Don/doff left sock   Shoes - Performed by helper: Don/doff right shoe, Don/doff left shoe          Lower body assist Assist for lower body dressing: Touching or steadying assistance (Pt > 75%)      Toileting Toileting Toileting activity did not occur: No continent bowel/bladder event Toileting steps completed by patient: Adjust  clothing prior to toileting Toileting steps completed by helper: Performs perineal hygiene, Adjust clothing after toileting Toileting Assistive Devices: Grab bar or rail  Toileting assist Assist level: More than reasonable time   Transfers Chair/bed transfer   Chair/bed transfer method: Stand pivot Chair/bed  transfer assist level: Touching or steadying assistance (Pt > 75%) Chair/bed transfer assistive device: Armrests     Locomotion Ambulation     Max distance: 50 Assist level: Touching or steadying assistance (Pt > 75%)   Wheelchair   Type: Manual Max wheelchair distance: 60 Assist Level: Touching or steadying assistance (Pt > 75%)  Cognition Comprehension Comprehension assist level: Follows basic conversation/direction with no assist  Expression Expression assist level: Expresses basic needs/ideas: With no assist  Social Interaction Social Interaction assist level: Interacts appropriately 75 - 89% of the time - Needs redirection for appropriate language or to initiate interaction.  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 Plan aspirin and Plavix 3 weeks then Plavix alone Continue CIR  2. DVT Prophylaxis/Anticoagulation: started heparin, d/ced SCD. Monitor for any signs of DVT 3. Pain Management:Neurontin 300 mg 3 times a day, Tylenol as needed 4. Mood:Provide emotional support 5. Neuropsych: This patientiscapable of making decisions on herown behalf. 6. Skin/Wound Care:Routine skin checks 7. Fluids/Electrolytes/Nutrition:Routine I&O's  8.Chronic combined systolic and diastolic congestive heart failure.   EJ FX 45-50% with Gr 1 DD Filed Weights   04/27/17 0554 04/28/17 0000 04/29/17 0500  Weight: 102 kg (224 lb 13.9 oz) 98.8 kg (217 lb 13 oz) 99.2 kg (218 lb 11.1 oz)    Lasix d/ced 9.Cardiomyopathy with AICD. Follow-up cardiology services    Stable daily weights as above 10.Hypertension. Coreg 25 mg  twice a day, BIDIL20-37.41milligrams twice a day Vitals:   04/28/17 1413 04/29/17 0500  BP: (!) 116/56 (!) 141/77  Pulse: 73 66  Resp: 18 18  Temp: 97.9 F (36.6 C) 98.4 F (36.9 C)  SpO2: 100% 100%   Labile on 3/22 11.Chronic right lower extremity edema.  .  12. CKDstage III. Baseline creatinine 1.69.   Cr 1.93 on 3/18   Labs ordered for Monday   Encourage fluids 13.Hyperlipidemia. Lipitor 14.GERD. Protonix 15. ABLA   Hb 10.4 on 3/18   Cont to monitor   Labs ordered for Monday 16. Hypoalbuminemia   Supplement initiated on 3/20  LOS (Days) 7 A FACE TO FACE EVALUATION WAS PERFORMED  Katalea Ucci Lorie Phenix, MD 04/29/2017 9:03 AM

## 2017-04-29 NOTE — Patient Outreach (Signed)
[facility]) Care Management  04/29/2017  Marshia Kuzel 1940-03-10 [id number]  Assessment- CSW received missed call and voice message from [facility] inpatient rehab social worker, Ovidio Kin with discharge plans and updates. She included in message that patient has a targeted d/c date of 05/11/17 and that she will be discharging  to her daughter [name]'s home.  Plan-CSW will follow up with SNF within two weeks.  Eula Fried, BSW, MSW, [email or web address] Phone: [phone] Fax: [phone]

## 2017-04-29 NOTE — Progress Notes (Addendum)
Speech Language Pathology Weekly Progress and Session Note  Patient Details  Name: Tara Dunn MRN: [id number] Date of Birth: 1940/02/17  Beginning of progress report period: April 22, 2017  End of progress report period: April 29, 2017   Today's Date: 04/29/2017 SLP Individual Time: 0850-0930 SLP Individual Time Calculation (min): 40 min  Short Term Goals: Week 1: SLP Short Term Goal 1 (Week 1): Patient will utilize a slow rate and over-articulation at the sentence level to achieve ~90% intelligibility with Min A verbal cues.  SLP Short Term Goal 1 - Progress (Week 1): Met SLP Short Term Goal 2 (Week 1): Patient will complete higher-level naming/word-finding tasks with Min A multimodal cues.  SLP Short Term Goal 2 - Progress (Week 1): Met SLP Short Term Goal 3 (Week 1): Patient will demonstrate functional problem solving for basic and familiar tasks with Mod A verbal cues.  SLP Short Term Goal 3 - Progress (Week 1): Met SLP Short Term Goal 4 (Week 1): Patient will identify 2 physical and 2 cognitive deficits with Mod A verbal cues.  SLP Short Term Goal 4 - Progress (Week 1): Met SLP Short Term Goal 5 (Week 1): Patient will utilize external memory aids to recall new, daily information with Mod A verbal and visual cues.  SLP Short Term Goal 5 - Progress (Week 1): Met SLP Short Term Goal 6 (Week 1): Patient will demonstrate selective attention to functional tasks in a mildly distracting enviornment for ~30 minutes with supervision verbal cues for redirection.  SLP Short Term Goal 6 - Progress (Week 1): Met    New Short Term Goals: Week 2: SLP Short Term Goal 1 (Week 2): Patient will utilize a slow rate and over-articulation at the sentence level to achieve ~90% intelligibility with supervision cues.  SLP Short Term Goal 2 (Week 2): Patient will complete higher-level naming/word-finding tasks with supervision cues.  SLP Short Term Goal 3 (Week 2): Patient will demonstrate functional  problem solving for basic and familiar tasks with Min A verbal cues.  SLP Short Term Goal 4 (Week 2): Patient will recognize and correct errors in the moment during functional tasks with mod assist verbal cues.  SLP Short Term Goal 5 (Week 2): Patient will utilize external memory aids to recall new, daily information with Min A verbal and visual cues.  SLP Short Term Goal 6 (Week 2): Patient will demonstrate selective attention to functional tasks in a moderately distracting enviornment for ~30 minutes with supervision verbal cues for redirection.   Weekly Progress Updates:   Pt has made slow but functional gains this reporting period and has met 6 out of 6 short term goals.  Pt is currently mod assist for tasks due to moderate cognitive deficits.  Pt also needs min assist-supervision cues for functional communication due to mild dysarthria and higher level word finding impairment.  Pt has demonstrated improved use of external aids to facilitate recall of daily information as well as selective attention to tasks. Pt would continue to benefit from skilled ST while inpatient in order to maximize functional independence and reduce burden of care prior to discharge.  Anticipate that pt will need 24/7 supervision at discharge in addition to [place] follow at next level of care.      Intensity: Minumum of 1-2 x/day, 30 to 90 minutes Frequency: 3 to 5 out of 7 days Duration/Length of Stay: 14-18 days  Treatment/Interventions: Cognitive remediation/compensation;Environmental controls;Internal/external aids;Speech/Language facilitation;Therapeutic Activities;Patient/family education;Functional tasks;Cueing hierarchy   Daily Session  Skilled Therapeutic  Interventions: Pt was seen for skilled ST targeting communication goals.  Pt could recall 1/2 speech intelligibility strategies, improving to 2/2 recall with use of written visual aid of choice of 4 strategies.  Pt needed intermittent min assist verbal cues to  slow rate and increase vocal intensity to achieve intelligibility at the sentence level.  She could complete convergent naming tasks with supervision verbal cues.  Pt's daughter was present during today's session and was updated regarding pt's current progress and goals of care.  Pt's daughter verified that pt is not back to her baseline yet for cognitive-linguistic function.  Pt was returned to room and left in wheelchair with daughter at bedside, quick release belt donned, and call bell within reach.       Function:   Eating Eating                 Cognition Comprehension Comprehension assist level: Follows basic conversation/direction with extra time/assistive device  Expression   Expression assist level: Expresses basic 90% of the time/requires cueing < 10% of the time.  Social Interaction Social Interaction assist level: Interacts appropriately 90% of the time - Needs monitoring or encouragement for participation or interaction.  Problem Solving Problem solving assist level: Solves basic 50 - 74% of the time/requires cueing 25 - 49% of the time  Memory Memory assist level: Recognizes or recalls 50 - 74% of the time/requires cueing 25 - 49% of the time   General    Pain Pain Assessment Pain Scale: 0-10 Pain Score: 0-No pain   Therapy/Group: Individual Therapy  Raider Valbuena, Selinda Orion 04/29/2017, 11:20 AM

## 2017-04-30 ENCOUNTER — Inpatient Hospital Stay (HOSPITAL_COMMUNITY): Payer: PPO | Admitting: Occupational Therapy

## 2017-04-30 NOTE — Progress Notes (Addendum)
Occupational Therapy Session Note  Patient Details  Name: Tara Dunn MRN: [id number] Date of Birth: 08-Aug-1940  Today's Date: 04/30/2017 OT Individual Time: 8676-1950 OT Individual Time Calculation (min): 30 min    Short Term Goals: Week 1:  OT Short Term Goal 1 (Week 1): Pt will complete 1/3 components of donning pants OT Short Term Goal 2 (Week 1): Pt will complete 1 grooming task in standing to improve balance OT Short Term Goal 3 (Week 1): Pt will complete 1/3 components of toileting   Skilled Therapeutic Interventions/Progress Updates:    Pt presents supine in bed agreeable to OT tx session, no c/o pain. Pt completing bed mobility supine>sitting EOB with HOB elevated with MinGuard assist and increased time to complete. Requires ModA for sit<>stand at RW, ambulating to bathroom using RW with MinA; pt able to complete sit<>stand remainder of session with MinA. Pt completing toileting with Min steadying assist in standing to complete clothing management. Ambulates to w/c in room with MinA and RW for UB/LB dressing. Pt completes UB dressing with setup, LB dressing with steadying assist during standing portion while pt advances pants over hips. Pt using sock aide to don socks, requires assist to don R shoe and therapist supporting LLE in figure 4 while pt dons L shoe. Pt stands at sink with MinA to brush teeth and wash face. Pt left seated in w/c end of session with chair alarm activated, call bell and needs within reach.   Therapy Documentation Precautions:  Precautions Precautions: Fall Precaution Comments: Rt hemi Restrictions Weight Bearing Restrictions: No      ADL: ADL ADL Comments: Please see functional navigator for ADL status  See Function Navigator for Current Functional Status.   Therapy/Group: Individual Therapy  Raymondo Band 04/30/2017, 8:09 AM

## 2017-04-30 NOTE — Progress Notes (Signed)
Tara Dunn is a 77 y.o. female 05/25/1940 [id number]  Subjective: No complaints or problems. Sitting in [facility] watching TV. Slept well. Feeling well.  Objective: Vital signs in last 24 hours: Temp:  [97.7 F (36.5 C)-98.5 F (36.9 C)] 98.5 F (36.9 C) (03/23 0534) Pulse Rate:  [78-85] 85 (03/23 0534) Resp:  [18-19] 18 (03/23 0534) BP: (130)/(64-70) 130/70 (03/23 0534) SpO2:  [95 %-96 %] 95 % (03/23 0534) Weight:  [96.8 kg (213 lb 6.5 oz)] 96.8 kg (213 lb 6.5 oz) (03/23 0500) Weight change: -2.4 kg (-5 lb 4.7 oz) Last BM Date: 04/29/17  Intake/Output from previous day: 03/22 0701 - 03/23 0700 In: 360 [P.O.:360] Out: -   Physical Exam General: No apparent distress    Lungs: Normal effort. Lungs clear to auscultation, no crackles or wheezes. Cardiovascular: Regular rate and rhythm, no edema Neurological: No new neurological deficits   Lab Results: BMET    Component Value Date/Time   NA 135 04/25/2017 0420   K 5.0 04/25/2017 0420   CL 103 04/25/2017 0420   CO2 22 04/25/2017 0420   GLUCOSE 106 (H) 04/25/2017 0420   BUN 53 (H) 04/25/2017 0420   CREATININE 1.93 (H) 04/25/2017 0420   CREATININE 1.65 (H) 03/11/2015 1203   CALCIUM 10.4 (H) 04/25/2017 0420   GFRNONAA 24 (L) 04/25/2017 0420   GFRAA 28 (L) 04/25/2017 0420   CBC    Component Value Date/Time   WBC 3.9 (L) 04/25/2017 0420   RBC 3.49 (L) 04/25/2017 0420   HGB 10.4 (L) 04/25/2017 0420   HCT 32.4 (L) 04/25/2017 0420   PLT 171 04/25/2017 0420   MCV 92.8 04/25/2017 0420   MCH 29.8 04/25/2017 0420   MCHC 32.1 04/25/2017 0420   RDW 17.1 (H) 04/25/2017 0420   LYMPHSABS 1.2 04/25/2017 0420   MONOABS 0.3 04/25/2017 0420   EOSABS 0.1 04/25/2017 0420   BASOSABS 0.0 04/25/2017 0420   CBG's (last 3):  No results for input(s): GLUCAP in the last 72 hours. LFT's Lab Results  Component Value Date   ALT 19 04/25/2017   AST 17 04/25/2017   ALKPHOS 79 04/25/2017   BILITOT 0.5 04/25/2017     Studies/Results: No results found.  Medications:  I have reviewed the patient's current medications. Scheduled Medications: . allopurinol  200 mg Oral Daily  . aspirin EC  81 mg Oral Daily  . atorvastatin  80 mg Oral q1800  . carvedilol  25 mg Oral BID WC  . clopidogrel  75 mg Oral Daily  . feeding supplement (PRO-STAT SUGAR FREE 64)  30 mL Oral BID  . gabapentin  300 mg Oral TID  . heparin injection (subcutaneous)  5,000 Units Subcutaneous Q8H  . isosorbide-hydrALAZINE  2 tablet Oral BID  . pantoprazole  40 mg Oral Daily   PRN Medications: acetaminophen **OR** acetaminophen (TYLENOL) oral liquid 160 mg/5 mL **OR** acetaminophen, ondansetron **OR** ondansetron (ZOFRAN) IV, senna-docusate, sorbitol  Assessment/Plan: Principal Problem:   Cerebrovascular accident (CVA) due to thrombosis of left middle cerebral artery (HCC) Active Problems:   Chronic combined systolic and diastolic CHF (congestive heart failure) (HCC)   Essential hypertension   OSA (obstructive sleep apnea)   Right hemiparesis (HCC)   Stage 3 chronic kidney disease (HCC)   Hypoalbuminemia due to protein-calorie malnutrition (HCC)   Acute blood loss anemia   Labile blood pressure   1. L MCA CVA with R HP and dysarthria - continue med mgmt and ongoing CIR therapy 2. CKD3 - monitor Bp and fluids -  stable 3. Chronic combined syst and diast HF - compensated and euvolemic - continue same med mgmt 4. hypertension - reasonable BP control - continue same med mgmt  Length of stay, days: 8   Valerie A. Asa Lente, MD 04/30/2017, 9:41 AM

## 2017-05-01 ENCOUNTER — Inpatient Hospital Stay (HOSPITAL_COMMUNITY): Payer: PPO

## 2017-05-01 NOTE — Progress Notes (Signed)
Tara Dunn is a 77 y.o. female 1940/08/19 [id number]  Subjective: No complaints or problems. Family visiting, will plan grounds pass to be outdoors in spring sunshine. Reports feeling well.  Objective: Vital signs in last 24 hours: Temp:  [98.4 F (36.9 C)-98.5 F (36.9 C)] 98.5 F (36.9 C) (03/24 0112) Pulse Rate:  [69-71] 69 (03/24 0112) Resp:  [18] 18 (03/24 0112) BP: (102-107)/(45-53) 107/45 (03/24 0112) SpO2:  [96 %-98 %] 96 % (03/24 0112) Weight:  [97.8 kg (215 lb 9.8 oz)] 97.8 kg (215 lb 9.8 oz) (03/24 0500) Weight change: 1 kg (2 lb 3.3 oz) Last BM Date: 04/29/17  Intake/Output from previous day: 03/23 0701 - 03/24 0700 In: 720 [P.O.:720] Out: -   Physical Exam General: No apparent distress    Lungs: Normal effort. Lungs clear to auscultation, no crackles or wheezes. Cardiovascular: Regular rate and rhythm, no edema Neurological: No new neurological deficits   Lab Results: BMET    Component Value Date/Time   NA 135 04/25/2017 0420   K 5.0 04/25/2017 0420   CL 103 04/25/2017 0420   CO2 22 04/25/2017 0420   GLUCOSE 106 (H) 04/25/2017 0420   BUN 53 (H) 04/25/2017 0420   CREATININE 1.93 (H) 04/25/2017 0420   CREATININE 1.65 (H) 03/11/2015 1203   CALCIUM 10.4 (H) 04/25/2017 0420   GFRNONAA 24 (L) 04/25/2017 0420   GFRAA 28 (L) 04/25/2017 0420   CBC    Component Value Date/Time   WBC 3.9 (L) 04/25/2017 0420   RBC 3.49 (L) 04/25/2017 0420   HGB 10.4 (L) 04/25/2017 0420   HCT 32.4 (L) 04/25/2017 0420   PLT 171 04/25/2017 0420   MCV 92.8 04/25/2017 0420   MCH 29.8 04/25/2017 0420   MCHC 32.1 04/25/2017 0420   RDW 17.1 (H) 04/25/2017 0420   LYMPHSABS 1.2 04/25/2017 0420   MONOABS 0.3 04/25/2017 0420   EOSABS 0.1 04/25/2017 0420   BASOSABS 0.0 04/25/2017 0420   CBG's (last 3):  No results for input(s): GLUCAP in the last 72 hours. LFT's Lab Results  Component Value Date   ALT 19 04/25/2017   AST 17 04/25/2017   ALKPHOS 79 04/25/2017   BILITOT 0.5 04/25/2017    Studies/Results: No results found.  Medications:  I have reviewed the patient's current medications. Scheduled Medications: . allopurinol  200 mg Oral Daily  . aspirin EC  81 mg Oral Daily  . atorvastatin  80 mg Oral q1800  . carvedilol  25 mg Oral BID WC  . clopidogrel  75 mg Oral Daily  . feeding supplement (PRO-STAT SUGAR FREE 64)  30 mL Oral BID  . gabapentin  300 mg Oral TID  . heparin injection (subcutaneous)  5,000 Units Subcutaneous Q8H  . isosorbide-hydrALAZINE  2 tablet Oral BID  . pantoprazole  40 mg Oral Daily   PRN Medications: acetaminophen **OR** acetaminophen (TYLENOL) oral liquid 160 mg/5 mL **OR** acetaminophen, ondansetron **OR** ondansetron (ZOFRAN) IV, senna-docusate, sorbitol  Assessment/Plan: Principal Problem:   Cerebrovascular accident (CVA) due to thrombosis of left middle cerebral artery (HCC) Active Problems:   Chronic combined systolic and diastolic CHF (congestive heart failure) (HCC)   Essential hypertension   OSA (obstructive sleep apnea)   Right hemiparesis (HCC)   Stage 3 chronic kidney disease (HCC)   Hypoalbuminemia due to protein-calorie malnutrition (HCC)   Acute blood loss anemia   Labile blood pressure   1. L MCA CVA with R HP and dysarthria - continue med mgmt and ongoing CIR therapy 2. CKD3 -  monitor BP and fluids - stable 3. Chronic combined syst and diast HF - compensated and euvolemic - continue same med mgmt 4. hypertension - reasonable BP control - continue same med mgmt  Length of stay, days: 9   Fahima Cifelli A. Asa Lente, MD 05/01/2017, 11:11 AM

## 2017-05-02 ENCOUNTER — Other Ambulatory Visit: Payer: Self-pay | Admitting: Licensed Clinical Social Worker

## 2017-05-02 ENCOUNTER — Inpatient Hospital Stay (HOSPITAL_COMMUNITY): Payer: PPO | Admitting: Speech Pathology

## 2017-05-02 ENCOUNTER — Inpatient Hospital Stay (HOSPITAL_COMMUNITY): Payer: PPO | Admitting: Physical Therapy

## 2017-05-02 ENCOUNTER — Inpatient Hospital Stay (HOSPITAL_COMMUNITY): Payer: PPO | Admitting: Occupational Therapy

## 2017-05-02 LAB — CBC WITH DIFFERENTIAL/PLATELET
BASOS PCT: 0 %
Basophils Absolute: 0 10*3/uL (ref 0.0–0.1)
EOS ABS: 0.2 10*3/uL (ref 0.0–0.7)
Eosinophils Relative: 3 %
HCT: 32.4 % — ABNORMAL LOW (ref 36.0–46.0)
Hemoglobin: 10.2 g/dL — ABNORMAL LOW (ref 12.0–15.0)
Lymphocytes Relative: 23 %
Lymphs Abs: 1.1 10*3/uL (ref 0.7–4.0)
MCH: 29.3 pg (ref 26.0–34.0)
MCHC: 31.5 g/dL (ref 30.0–36.0)
MCV: 93.1 fL (ref 78.0–100.0)
MONO ABS: 0.5 10*3/uL (ref 0.1–1.0)
MONOS PCT: 10 %
NEUTROS PCT: 64 %
Neutro Abs: 3.2 10*3/uL (ref 1.7–7.7)
PLATELETS: 222 10*3/uL (ref 150–400)
RBC: 3.48 MIL/uL — ABNORMAL LOW (ref 3.87–5.11)
RDW: 16.6 % — AB (ref 11.5–15.5)
WBC: 5.1 10*3/uL (ref 4.0–10.5)

## 2017-05-02 LAB — BASIC METABOLIC PANEL
Anion gap: 10 (ref 5–15)
BUN: 51 mg/dL — ABNORMAL HIGH (ref 6–20)
CALCIUM: 10.6 mg/dL — AB (ref 8.9–10.3)
CO2: 23 mmol/L (ref 22–32)
CREATININE: 1.8 mg/dL — AB (ref 0.44–1.00)
Chloride: 102 mmol/L (ref 101–111)
GFR calc non Af Amer: 26 mL/min — ABNORMAL LOW (ref >60)
GFR, EST AFRICAN AMERICAN: 30 mL/min — AB (ref >60)
GLUCOSE: 100 mg/dL — AB (ref 65–99)
Potassium: 4.9 mmol/L (ref 3.5–5.1)
Sodium: 135 mmol/L (ref 135–145)

## 2017-05-02 NOTE — Progress Notes (Signed)
[facility] PHYSICAL MEDICINE & REHABILITATION     PROGRESS NOTE    Subjective/Complaints: Patient seen lying in bed this morning. She states she slept well overnight. She states she had a good weekend.  ROS: Denies CP, SOB, N/V/D  Objective: Vital Signs: Blood pressure 135/60, pulse 78, temperature 98.1 F (36.7 C), temperature source Oral, resp. rate 18, height 5\' 3"  (1.6 m), weight 99.5 kg (219 lb 5.7 oz), SpO2 100 %. No results found. No results for input(s): WBC, HGB, HCT, PLT in the last 72 hours. No results for input(s): NA, K, CL, GLUCOSE, BUN, CREATININE, CALCIUM in the last 72 hours.  Invalid input(s): CO CBG (last 3)  No results for input(s): GLUCAP in the last 72 hours.  Wt Readings from Last 3 Encounters:  05/02/17 99.5 kg (219 lb 5.7 oz)  04/21/17 99 kg (218 lb 4.1 oz)  04/15/17 100.2 kg (221 lb)    Physical Exam:  Constitutional: No distress . Vital signs reviewed. HENT: Normocephalic, atraumatic Eyes: EOMI, No discharge Cardiovascular: RRR. No JVD    Respiratory: CTA Bilaterally. Normal effort    GI: BS +, non-distended  Musc: No edema or tenderness in extremities Neurological: Alertand oriented  Right central 7 with dysarthria reasonable insight and awareness.   Follows basic commands.   Motor: B/l UE grossly 4/5 prox to distal (left stronger than right) RLE: 2+/5 prox to distal LLE 3-/5 prox to distal.   Skin. Warm and dry Psych: Pleasant and cooperative   Assessment/Plan: 1.  Functional and mobility deficits secondary to left posterior limb internal capsule infarct which require 3+ hours per day of interdisciplinary therapy in a comprehensive inpatient rehab setting. Physiatrist is providing close team supervision and 24 hour management of active medical problems listed below. Physiatrist and rehab team continue to assess barriers to discharge/monitor patient progress toward functional and medical goals.  Function:  Bathing Bathing position    Position: Shower  Bathing parts Body parts bathed by patient: Right arm, Left arm, Chest, Abdomen, Right upper leg, Left upper leg, Right lower leg, Left lower leg, Buttocks, Front perineal area Body parts bathed by helper: Back  Bathing assist Assist Level: Touching or steadying assistance(Pt > 75%)      Upper Body Dressing/Undressing Upper body dressing   What is the patient wearing?: Pull over shirt/dress     Pull over shirt/dress - Perfomed by patient: Thread/unthread right sleeve, Thread/unthread left sleeve, Put head through opening, Pull shirt over trunk Pull over shirt/dress - Perfomed by helper: Pull shirt over trunk Button up shirt - Perfomed by patient: Thread/unthread right sleeve, Thread/unthread left sleeve, Pull shirt around back Button up shirt - Perfomed by helper: Button/unbutton shirt    Upper body assist Assist Level: Set up      Lower Body Dressing/Undressing Lower body dressing   What is the patient wearing?: Pants, Socks, Shoes     Pants- Performed by patient: Thread/unthread right pants leg, Thread/unthread left pants leg, Pull pants up/down Pants- Performed by helper: Thread/unthread right pants leg, Thread/unthread left pants leg     Socks - Performed by patient: Don/doff right sock, Don/doff left sock(sock aid) Socks - Performed by helper: Don/doff right sock, Don/doff left sock   Shoes - Performed by helper: Don/doff right shoe, Don/doff left shoe          Lower body assist Assist for lower body dressing: Touching or steadying assistance (Pt > 75%)      Toileting Toileting Toileting activity did not occur: No  continent bowel/bladder event Toileting steps completed by patient: Adjust clothing prior to toileting, Adjust clothing after toileting Toileting steps completed by helper: Performs perineal hygiene, Adjust clothing after toileting Toileting Assistive Devices: Grab bar or rail  Toileting assist Assist level: Touching or steadying  assistance (Pt.75%)   Transfers Chair/bed transfer   Chair/bed transfer method: Ambulatory Chair/bed transfer assist level: Touching or steadying assistance (Pt > 75%) Chair/bed transfer assistive device: Armrests, Medical sales representative     Max distance: 85 ft Assist level: Touching or steadying assistance (Pt > 75%)   Wheelchair   Type: Manual Max wheelchair distance: 50' Assist Level: Touching or steadying assistance (Pt > 75%)  Cognition Comprehension Comprehension assist level: Understands basic 90% of the time/cues < 10% of the time  Expression Expression assist level: Expresses basic 90% of the time/requires cueing < 10% of the time.  Social Interaction Social Interaction assist level: Interacts appropriately 90% of the time - Needs monitoring or encouragement for participation or interaction.  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 Plan aspirin and Plavix 3 weeks then Plavix alone Continue CIR    Weekend notes reviewed 2. DVT Prophylaxis/Anticoagulation: started heparin, d/ced SCD. Monitor for any signs of DVT 3. Pain Management:Neurontin 300 mg 3 times a day, Tylenol as needed 4. Mood:Provide emotional support 5. Neuropsych: This patientiscapable of making decisions on herown behalf. 6. Skin/Wound Care:Routine skin checks 7. Fluids/Electrolytes/Nutrition:Routine I&O's  8.Chronic combined systolic and diastolic congestive heart failure.   EJ FX 45-50% with Gr 1 DD Filed Weights   04/30/17 0500 05/01/17 0500 05/02/17 0453  Weight: 96.8 kg (213 lb 6.5 oz) 97.8 kg (215 lb 9.8 oz) 99.5  kg (219 lb 5.7 oz)    Lasix d/ced 9.Cardiomyopathy with AICD. Follow-up cardiology services    Stable daily weights as above 10.Hypertension. Coreg 25 mg twice a day, BIDIL20-37.40milligrams twice a day Vitals:   05/01/17 2200 05/02/17 0453  BP: 124/68 135/60  Pulse: 70 78  Resp:  18  Temp:  98.1 F (36.7 C)  SpO2: 98% 100%   Relatively controlled on 3/25 11.Chronic right lower extremity edema.  .  12. CKDstage III. Baseline creatinine 1.69.   Cr 1.93 on 3/18   Labs pending   Encourage fluids 13.Hyperlipidemia. Lipitor 14.GERD. Protonix 15. ABLA   Hb 10.4 on 3/18   Cont to monitor   Labs pending 16. Hypoalbuminemia   Supplement initiated on 3/20  LOS (Days) 10 A FACE TO FACE EVALUATION WAS PERFORMED  Marny Smethers Lorie Phenix, MD 05/02/2017 9:21 AM

## 2017-05-02 NOTE — Progress Notes (Signed)
Social Work Patient ID: Tara Dunn, female   DOB: September 24, 1940, 77 y.o.   MRN: [id number]  Spoke with [facility] to inform discharge date and the plan to go to daughter's home at discharge where she can provide supervision level. [facility] to continue to follow at home.

## 2017-05-02 NOTE — Progress Notes (Signed)
Speech Language Pathology Daily Session Note  Patient Details  Name: Tara Dunn MRN: [id number] Date of Birth: [birth year]  Today's Date: 05/02/2017 SLP Individual Time: 1300-1400 SLP Individual Time Calculation (min): 60 min  Short Term Goals: Week 2: SLP Short Term Goal 1 (Week 2): Patient will utilize a slow rate and over-articulation at the sentence level to achieve ~90% intelligibility with supervision cues.  SLP Short Term Goal 2 (Week 2): Patient will complete higher-level naming/word-finding tasks with supervision cues.  SLP Short Term Goal 3 (Week 2): Patient will demonstrate functional problem solving for basic and familiar tasks with Min A verbal cues.  SLP Short Term Goal 4 (Week 2): Patient will recognize and correct errors in the moment during functional tasks with mod assist verbal cues.  SLP Short Term Goal 5 (Week 2): Patient will utilize external memory aids to recall new, daily information with Min A verbal and visual cues.  SLP Short Term Goal 6 (Week 2): Patient will demonstrate selective attention to functional tasks in a moderately distracting enviornment for ~30 minutes with supervision verbal cues for redirection.   Skilled Therapeutic Interventions: Skilled treatment session focused on cognition goals. SLP facilitated session by providing supervision cues to sequence 3 and 4 picture cards. Pt able to describe each card with ~ 85% intelligibility at the sentence level and Min A faded to supervision cues for use of speech intelligibility strategies. Pt required Mod A to Min a cues to recall schedule that previous SLP established for toileting. Pt was left upright in wheelchair with all needs within reach. Continue per current plan of care.      Function:  Eating Eating   Modified Consistency Diet: No Eating Assist Level: No help, No cues           Cognition Comprehension Comprehension assist level: Understands basic 90% of the time/cues < 10% of the time   Expression   Expression assist level: Expresses basic 75 - 89% of the time/requires cueing 10 - 24% of the time. Needs helper to occlude trach/needs to repeat words.;Expresses basic 90% of the time/requires cueing < 10% of the time.  Social Interaction Social Interaction assist level: Interacts appropriately 90% of the time - Needs monitoring or encouragement for participation or interaction.  Problem Solving Problem solving assist level: Solves basic 50 - 74% of the time/requires cueing 25 - 49% of the time  Memory Memory assist level: Recognizes or recalls 50 - 74% of the time/requires cueing 25 - 49% of the time    Pain    Therapy/Group: Individual Therapy  Pete Schnitzer 05/02/2017, 2:22 PM

## 2017-05-02 NOTE — Patient Outreach (Signed)
[facility]) Care Management  05/02/2017  Jaycie Hue 1940-10-29 [id number]  Assessment- CSW received incoming return call from Ovidio Kin, assigned social worker at [facility] inpatient rehab who is following patient. She reports that patient's goals have been upgraded to supervision level which means she has made excellent progress in therapy. CSW was informed that patient is doing a lot better than they initially thought that she would do. SW Jacqlyn Larsen spoke with patient's daughter last week and plan remains that patient will return home with daughter instead of returning back to [place]. CSW was informed that there has been no discussion on palliative care and no consult has been placed. CSW was also informed that since patient is doing well they they may push up her discharge date. Patient has a care team plan meeting on 05/04/17 and daughter has plans to visit patient during her therapy next week. SW Ovidio Kin is agreeable to keep [facility] CSW updated.   Plan-CSW will update [facility] UM Department.  Eula Fried, BSW, MSW, [email or web address] Phone: [phone] Fax: [phone]

## 2017-05-02 NOTE — Patient Outreach (Signed)
[facility]) Care Management  05/02/2017  Tara Dunn 1940/03/13 [id number]  Assessment- CSW received email from [facility] on 04/30/17 requesting information on patient. Request is if [facility] CSW could find out what patient's treatment goals are and if patient had a palliative care consult. CSW completed call to [facility] inpatient rehab social worker Ovidio Kin and left a voice message requesting a return call with these updates once available.  Plan-CSW will continue to follow patient and await for SNF discharge on 05/11/17.  Eula Fried, BSW, MSW, [email or web address] Phone: [phone] Fax: [phone]

## 2017-05-02 NOTE — Progress Notes (Signed)
Physical Therapy Weekly Progress Note  Patient Details  Name: Tara Dunn MRN: [id number] Date of Birth: 06/26/1940  Beginning of progress report period: April 24, 2017 End of progress report period: May 02, 2017  Today's Date: 05/02/2017 PT Individual Time: 1100-1200 PT Individual Time Calculation (min): 60 min   Patient has met 3 of 3 short term goals.  Pt currently requires minA for bed mobility, minA stand pivot transfers and gait with RW. 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 Pt is motivated to reach S goal level in order to return to daughters home at d/c. Will require family education session prior to d/c.  Patient continues to demonstrate the following deficits muscle weakness and muscle joint tightness, decreased cardiorespiratoy endurance, impaired timing and sequencing, unbalanced muscle activation, decreased coordination and decreased motor planning, decreased memory and delayed processing and decreased sitting balance, decreased standing balance, decreased postural control, hemiplegia and decreased balance strategies and therefore will continue to benefit from skilled PT intervention to increase functional independence with mobility.  Patient progressing toward long term goals..  Plan of care revisions: goals upgraded to S overall, continue minA for car transfers, modA stairs without rails for home entry..  PT Short Term Goals Week 1:  PT Short Term Goal 1 (Week 1): Pt will perform sit to stand with mod A x 1 with RW from wheelchair. PT Short Term Goal 1 - Progress (Week 1): Met PT Short Term Goal 2 (Week 1): Pt will transfer w/c to mat with mod A x 1 and LRAD. PT Short Term Goal 2 - Progress (Week 1): Met PT Short Term Goal 3 (Week 1): Pt will ambulate 10 ft with mod A x 1 with RW. PT Short  Term Goal 3 - Progress (Week 1): Met Week 2:  PT Short Term Goal 1 (Week 2): =LTG due to estimated LOS  Skilled Therapeutic Interventions/Progress Updates: Pt received seated in w/c, denies pain and agreeable to treatment. Dons shirt setupA; standbyA for donning pants for standing balance. Pt able to don R sock and shoe with assist to maintain LLE in figure four position on R knee, unable to perform on R foot d/t hip ROM restrictions into external rotation. W/c propulsion x100' with BUE for strengthening and coordination. Stand pivot transfer min guard to mat table with RW. Sit <>stand 2x5 reps, standing heel raises 3x15 reps. Standing balance/ passive gastroc ROM on red foam wedge while performing unilateral reaching/fine motor control task at table top, min guard overall and occasional cues for trunk/hip extension. Gait x75' RW and min guard; occasional cues for R foot clearance and upright posture. Ascent/descent 8 steps 3" height with B handrails and min guard, step-to pattern self-selected however pt alternates which foot leads. Cues for full foot placement on step prior to ascending. Returned to room totalA in w/c; remained seated with chair alarm intact and all needs in reach.      Therapy Documentation Precautions:  Precautions Precautions: Fall Precaution Comments: Rt hemi Restrictions Weight Bearing Restrictions: No  See Function Navigator for Current Functional Status.  Therapy/Group: Individual Therapy  Luberta Mutter 05/02/2017, 7:53 AM

## 2017-05-02 NOTE — Progress Notes (Signed)
Occupational Therapy Weekly Progress Note  Patient Details  Name: Tara Dunn MRN: [id number] Date of Birth: December 02, 1940  Beginning of progress report period: April 23, 2017 End of progress report period: May 02, 2017  Today's Date: 05/02/2017 OT Individual Time: 3335-4562 OT Individual Time Calculation (min): 69 min    Patient has met 3 of 3 short term goals.  Tara Dunn is making steady progress with OT at this time.  She currently needs min to mod assist for shower transfers as well as mod assist for LB dressing tasks with use of AE.  UB selfcare is at a supervision level with LB bathing at min assist.  She still demonstrates the need for min to mod assist for sit to stand, depending on the surface height and UE use.  She also continues to demonstrate decreased strength and coordination in the RLE when it comes to taking steps.  Min assist is needed at times to help increase step length on the right side.  Overall feel she is making steady progress toward current OT goals at overall min assist.  Will update based on last weeks progress to a supervision level overall.    Patient continues to demonstrate the following deficits: muscle weakness, impaired timing and sequencing, unbalanced muscle activation and decreased coordination and decreased standing balance, hemiplegia and decreased balance strategies and therefore will continue to benefit from skilled OT intervention to enhance overall performance with BADL and Reduce care partner burden.  Patient progressing toward long term goals..  Continue plan of care.  OT Short Term Goals Week 2:  OT Short Term Goal 1 (Week 2): Pt will continue working toward supervision level goals for discharge.  Skilled Therapeutic Interventions/Progress Updates:    Pt completed bathing and dressing during session.  Min assist for initial sit to stand from the wheelchair with min assist to ambulate into the bathroom and over to the shower bench.  Once on  the bench, she needed max assist for removal of LB clothing, secondary to not being able to lift up the RLE efficiently to remove her shoe, sock, or pants.  She completed bathing with min assist, integrating a LH sponge for washing her lower legs, feet, and her back.  Therapist assisted with washing back slightly more thoroughly.  She transferred out to the wheelchair with mod assist for initial sit to stand from the tub bench.  Therapist provided reacher to use with threading undergarment protector.  She was able to donn them over the left side but still needed assist with the right.  Min assist with mod demonstrational cueing needed to scoot to the edge of the chair, position her feet, and stand for pulling them up over her hips.  She threaded her pants without AE and pulled over her hips at the same level.  Sockaide was utilized for her socks with min assist on the left and then a shoe funned for donning shoes with elastic laces.  Mod assist for setup with mod assist for use of the shoe funnel with the RLE.  Finished session with work on Product/process development scientist and pt picking up pennies one at a time and placing into a cup.  She was able to pick up 7 and place one at a time with the RUE and only 1 drop.  Had her complete 9 hole peg test as well.  Results were 50 seconds on the non-dominant left and 38 seconds on the right.  Finished session with call button and phone in reach  and safety belt and chair alarm in place.     Therapy Documentation Precautions:  Precautions Precautions: Fall Precaution Comments: Rt hemi Restrictions Weight Bearing Restrictions: No  Pain: Pain Assessment Pain Score: 0-No pain ADL: See Function Navigator for Current Functional Status.   Therapy/Group: Individual Therapy  Jatia Musa OTR/L 05/02/2017, 4:03 PM

## 2017-05-02 NOTE — Progress Notes (Signed)
Pt refused cpap fir tonight because she didn't like the masks provided. Educated pt to have family bring home unit and pt stated that she would.

## 2017-05-03 ENCOUNTER — Inpatient Hospital Stay (HOSPITAL_COMMUNITY): Payer: PPO | Admitting: Occupational Therapy

## 2017-05-03 ENCOUNTER — Other Ambulatory Visit: Payer: Self-pay | Admitting: Licensed Clinical Social Worker

## 2017-05-03 ENCOUNTER — Inpatient Hospital Stay (HOSPITAL_COMMUNITY): Payer: PPO

## 2017-05-03 ENCOUNTER — Inpatient Hospital Stay (HOSPITAL_COMMUNITY): Payer: PPO | Admitting: Physical Therapy

## 2017-05-03 NOTE — Progress Notes (Signed)
[place] PHYSICAL MEDICINE & REHABILITATION     PROGRESS NOTE    Subjective/Complaints: Patient seen sitting up in bed this morning. She states she slept well overnight. She states she is getting stronger. She asks me to adjust her tray so she can eat breakfast.  ROS: Denies CP, SOB, N/V/D  Objective: Vital Signs: Blood pressure 110/61, pulse 71, temperature 97.9 F (36.6 C), temperature source Oral, resp. rate 16, height 5\' 3"  (1.6 m), weight 100.3 kg (221 lb 1.9 oz), SpO2 99 %. No results found. Recent Labs    05/02/17 0938  WBC 5.1  HGB 10.2*  HCT 32.4*  PLT 222   Recent Labs    05/02/17 0938  NA 135  K 4.9  CL 102  GLUCOSE 100*  BUN 51*  CREATININE 1.80*  CALCIUM 10.6*   CBG (last 3)  No results for input(s): GLUCAP in the last 72 hours.  Wt Readings from Last 3 Encounters:  05/03/17 100.3 kg (221 lb 1.9 oz)  04/21/17 99 kg (218 lb 4.1 oz)  04/15/17 100.2 kg (221 lb)    Physical Exam:  Constitutional: No distress . Vital signs reviewed. HENT: Normocephalic, atraumatic Eyes: EOMI, No discharge Cardiovascular: RRR. No JVD    Respiratory: CTA Bilaterally. Normal effort    GI: BS +, non-distended  Musc: No edema or tenderness in extremities Neurological: Alertand oriented  Right central 7 with dysarthria reasonable insight and awareness.   Follows basic commands.   Motor: B/l UE grossly 4-4+/5 prox to distal (left stronger than right) RLE: 3+-4-/5 prox to distal LLE: 4--4/5 prox to distal.   Skin. Warm and dry Psych: Pleasant and cooperative  Assessment/Plan: 1.  Functional and mobility deficits secondary to left posterior limb internal capsule infarct which require 3+ hours per day of interdisciplinary therapy in a comprehensive inpatient rehab setting. Physiatrist is providing close team supervision and 24 hour management of active medical problems listed below. Physiatrist and rehab team continue to assess barriers to discharge/monitor patient  progress toward functional and medical goals.  Function:  Bathing Bathing position   Position: Shower  Bathing parts Body parts bathed by patient: Right arm, Left arm, Chest, Abdomen, Front perineal area, Buttocks, Right upper leg, Left upper leg, Left lower leg, Right lower leg Body parts bathed by helper: Back  Bathing assist Assist Level: Touching or steadying assistance(Pt > 75%)      Upper Body Dressing/Undressing Upper body dressing   What is the patient wearing?: Pull over shirt/dress     Pull over shirt/dress - Perfomed by patient: Thread/unthread right sleeve, Thread/unthread left sleeve, Put head through opening, Pull shirt over trunk Pull over shirt/dress - Perfomed by helper: Pull shirt over trunk Button up shirt - Perfomed by patient: Thread/unthread right sleeve, Thread/unthread left sleeve, Pull shirt around back Button up shirt - Perfomed by helper: Button/unbutton shirt    Upper body assist Assist Level: Supervision or verbal cues      Lower Body Dressing/Undressing Lower body dressing   What is the patient wearing?: Pants, Underwear, Socks, Shoes Underwear - Performed by patient: Thread/unthread left underwear leg Underwear - Performed by helper: Thread/unthread right underwear leg, Pull underwear up/down Pants- Performed by patient: Thread/unthread right pants leg, Thread/unthread left pants leg, Pull pants up/down Pants- Performed by helper: Thread/unthread right pants leg, Thread/unthread left pants leg     Socks - Performed by patient: Don/doff right sock, Don/doff left sock(sockaide) Socks - Performed by helper: Don/doff right sock, Don/doff left sock Shoes -  Performed by patient: Don/doff left shoe(shoe funnel) Shoes - Performed by helper: Don/doff right shoe          Lower body assist Assist for lower body dressing: Touching or steadying assistance (Pt > 75%)      Toileting Toileting Toileting activity did not occur: No continent bowel/bladder  event Toileting steps completed by patient: Adjust clothing prior to toileting, Adjust clothing after toileting Toileting steps completed by helper: Performs perineal hygiene, Adjust clothing after toileting Toileting Assistive Devices: Grab bar or rail  Toileting assist Assist level: Touching or steadying assistance (Pt.75%)   Transfers Chair/bed transfer   Chair/bed transfer method: Stand pivot Chair/bed transfer assist level: Touching or steadying assistance (Pt > 75%) Chair/bed transfer assistive device: Armrests, Medical sales representative     Max distance: 75 Assist level: Touching or steadying assistance (Pt > 75%)   Wheelchair   Type: Manual Max wheelchair distance: 50' Assist Level: Touching or steadying assistance (Pt > 75%)  Cognition Comprehension Comprehension assist level: Understands basic 90% of the time/cues < 10% of the time  Expression Expression assist level: Expresses basic 75 - 89% of the time/requires cueing 10 - 24% of the time. Needs helper to occlude trach/needs to repeat words., Expresses basic 90% of the time/requires cueing < 10% of the time.  Social Interaction Social Interaction assist level: Interacts appropriately 90% of the time - Needs monitoring or encouragement for participation or interaction.  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 Plan aspirin and Plavix 3 weeks then Plavix alone Continue CIR  2. DVT Prophylaxis/Anticoagulation: started heparin, d/ced SCD. Monitor for any signs of DVT 3. Pain Management:Neurontin 300 mg 3 times a day, Tylenol as needed 4.  Mood:Provide emotional support 5. Neuropsych: This patientiscapable of making decisions on herown behalf. 6. Skin/Wound Care:Routine skin checks 7. Fluids/Electrolytes/Nutrition:Routine I&O's  8.Chronic combined systolic and diastolic congestive heart failure.   EJ FX 45-50% with Gr 1 DD Filed Weights   05/01/17 0500 05/02/17 0453 05/03/17 0442  Weight: 97.8 kg (215 lb 9.8 oz) 99.5 kg (219 lb 5.7 oz) 100.3 kg (221 lb 1.9 oz)    Lasix d/ced 9.Cardiomyopathy with AICD. Follow-up cardiology services    ?Weights trending up  10.Hypertension. Coreg 25 mg twice a day, BIDIL20-37.29milligrams twice a day Vitals:   05/02/17 2029 05/03/17 0020  BP: 108/71 110/61  Pulse: 75 71  Resp:  16  Temp:  97.9 F (36.6 C)  SpO2: 98% 99%   Relatively controlled on 3/26 11.Chronic right lower extremity edema.  .  12. CKDstage III. Baseline creatinine 1.69.   Cr 1.80 on 3/25   Encourage fluids 13.Hyperlipidemia. Lipitor 14.GERD. Protonix 15. ABLA   Hb 10.2 on 3/25   Cont to monitor 16. Hypoalbuminemia   Supplement initiated on 3/20  LOS (Days) 11 A FACE TO FACE EVALUATION WAS PERFORMED  Rosbel Buckner Lorie Phenix, MD 05/03/2017 8:23 AM

## 2017-05-03 NOTE — Progress Notes (Signed)
Speech Language Pathology Daily Session Note  Patient Details  Name: Tara Dunn MRN: [id number] Date of Birth: 1940-09-03  Today's Date: 05/03/2017 SLP Individual Time: 1500-1530 SLP Individual Time Calculation (min): 30 min  Short Term Goals: Week 2: SLP Short Term Goal 1 (Week 2): Patient will utilize a slow rate and over-articulation at the sentence level to achieve ~90% intelligibility with supervision cues.  SLP Short Term Goal 2 (Week 2): Patient will complete higher-level naming/word-finding tasks with supervision cues.  SLP Short Term Goal 3 (Week 2): Patient will demonstrate functional problem solving for basic and familiar tasks with Min A verbal cues.  SLP Short Term Goal 4 (Week 2): Patient will recognize and correct errors in the moment during functional tasks with mod assist verbal cues.  SLP Short Term Goal 5 (Week 2): Patient will utilize external memory aids to recall new, daily information with Min A verbal and visual cues.  SLP Short Term Goal 6 (Week 2): Patient will demonstrate selective attention to functional tasks in a moderately distracting enviornment for ~30 minutes with supervision verbal cues for redirection.   Skilled Therapeutic Interventions: Skilled ST services focused on cognitive skills. SLP facilitated recall of personal bathroom protocol, requiring mod A verbal cues. SLP facilitated basic problem solving with card game, blink, pt required mod A verbal cues for problem solving and min A verbal cues to utilize visual aid to recall rules. Pt required min A verbal cues for intelligibly at sentence level. Pt was left in room with call bell within reach. Reccomend to continue skilled ST services.      Function:  Eating Eating                 Cognition Comprehension Comprehension assist level: Understands basic 90% of the time/cues < 10% of the time  Expression   Expression assist level: Expresses basic 90% of the time/requires cueing < 10% of the  time.  Social Interaction Social Interaction assist level: Interacts appropriately 90% of the time - Needs monitoring or encouragement for participation or interaction.  Problem Solving Problem solving assist level: Solves basic 50 - 74% of the time/requires cueing 25 - 49% of the time  Memory Memory assist level: Recognizes or recalls 75 - 89% of the time/requires cueing 10 - 24% of the time    Pain Pain Assessment Pain Score: 0-No pain  Therapy/Group: Individual Therapy  [name]  [facility] 05/03/2017, 3:30 PM

## 2017-05-03 NOTE — Patient Outreach (Signed)
[facility]) Care Management  05/03/2017  Cristalle Lavine 1940/11/23 [id number]  Assessment- CSW arrived at [place] and visited patient in her room during her lunch. [facility] CSW introduced self, reason for visit and of [place]. Patient provided HIPPA verifications successfully. Patient reports "I'm feeling good, I feel the improvement." Patient shares that she is a fall risk and has plans to reside with her daughter once she discharges from SNF on 05/11/17. Patient shares that she is doing well in speech therapy and feels confident in the progress she has made in that as well. Patient shares that she prefers "to stay by myself" but is aware that she will need to stay with her daughter for several months until she is able to live independently again. Patient unable to confirm daughter's address but states it is in [place], [place]. Patient reports having stable transportation through [employer] but that her daughter will more than likely take her to all of her medical appointments. Patient denies any social work needs at this time. [facility] CSW will place referral for [facility] RNCM once patient discharges back home on 05/11/17.  Eula Fried, BSW, MSW, [email or web address] Phone: [phone] Fax: [phone]

## 2017-05-03 NOTE — Progress Notes (Signed)
Occupational Therapy Session Note  Patient Details  Name: Tara Dunn MRN: [id number] Date of Birth: 1940-04-01  Today's Date: 05/03/2017 OT Individual Time: 9150-5697 OT Individual Time Calculation (min): 57 min    Short Term Goals: Week 1:  OT Short Term Goal 1 (Week 1): Pt will complete 1/3 components of donning pants OT Short Term Goal 1 - Progress (Week 1): Met OT Short Term Goal 2 (Week 1): Pt will complete 1 grooming task in standing to improve balance OT Short Term Goal 2 - Progress (Week 1): Met OT Short Term Goal 3 (Week 1): Pt will complete 1/3 components of toileting  OT Short Term Goal 3 - Progress (Week 1): Met  Skilled Therapeutic Interventions/Progress Updates:    Pt completed transfer from the wheelchair to the therapy mat with min assist using the RW.  Had pt focus on sit to stand from the therapy mat to start session with emphasis on foot position, forward lean, and translation of hips forward over feet and into extension.  She needed mod demonstrational cueing and min assist to complete several sit to stand transitions while engaged in coordination activity with the RUE.  Transitioned to working on pre-gait activities in standing, having pt step forward with the LLE to target while stabilizing on the RLE.  Also worked on stepping toward target with the RLE as well.  Min to mod facilitation for balance and weightshift during these tasks.  Pt completed short distance mobility with mod assist and no assistive device for less than 25'.  Finished session with functional mobility back to the room with use of the RW and min assist for greater weightshift to the left, in order to increase step length.    Therapy Documentation Precautions:  Precautions Precautions: Fall Precaution Comments: Rt hemi Restrictions Weight Bearing Restrictions: No  Pain: Pain Assessment Pain Score: 0-No pain ADL: See Function Navigator for Current Functional Status.   Therapy/Group:  Individual Therapy  Maurion Walkowiak OTR/L 05/03/2017, 3:31 PM

## 2017-05-03 NOTE — Progress Notes (Signed)
Physical Therapy Session Note  Patient Details  Name: Tara Dunn MRN: [id number] Date of Birth: [birth year]  Today's Date: 05/03/2017 PT Individual Time: 1000-1100 PT Individual Time Calculation (min): 60 min   Short Term Goals: Week 2:  PT Short Term Goal 1 (Week 2): =LTG due to estimated LOS  Skilled Therapeutic Interventions/Progress Updates: Pt received seated in w/c, denies pain and agreeable to treatment. Transported to gym totalA for energy conservation. Stand pivot with RW to/from nustep with min guard; requires two attempts to stand and initiate transfer d/t posterior LOB balance, cues for foot placement to increase anterior weight shift. Performed nustep BUE/BLE x9 min average 50 steps/min for aerobic endurance, LE ROM and strengthening. Standing alternating LE taps to 3" step with RW and min guard; increased time required to elevate RLE to step however able to perform without assist. Sit >supine minA for LE management, supine>sit with S and increased time. Supine hip flexor stretch BLE off EOB x2 min each. Hooklying trunk rotation x10 each direction for lumbopelvic dissociation and core strengthening. Sit <>stand x5 reps with S; cues for foot placement on first trial. Standing heel raises x15 reps. Gait with RW and min guard x120' before limited by fatigue. Remained seated in w/c at end of session, family present and chair alarm intact, all needs in reach.      Therapy Documentation Precautions:  Precautions Precautions: Fall Precaution Comments: Rt hemi Restrictions Weight Bearing Restrictions: No   See Function Navigator for Current Functional Status.   Therapy/Group: Individual Therapy  Luberta Mutter 05/03/2017, 12:50 PM

## 2017-05-03 NOTE — Progress Notes (Signed)
Occupational Therapy Session Note  Patient Details  Name: Tara Dunn MRN: [id number] Date of Birth: 01-10-1941  Today's Date: 05/03/2017 OT Individual Time: 1102-1117 OT Individual Time Calculation (min): 54 min    Short Term Goals: Week 1:  OT Short Term Goal 1 (Week 1): Pt will complete 1/3 components of donning pants OT Short Term Goal 1 - Progress (Week 1): Met OT Short Term Goal 2 (Week 1): Pt will complete 1 grooming task in standing to improve balance OT Short Term Goal 2 - Progress (Week 1): Met OT Short Term Goal 3 (Week 1): Pt will complete 1/3 components of toileting  OT Short Term Goal 3 - Progress (Week 1): Met  Skilled Therapeutic Interventions/Progress Updates:    Pt completed bathing and dressing sit to stand at the sink this session, since she took a shower late yesterday in the afternoon.  Min assist for supine to sit and for stand pivot transfer to the wheelchair.  She was able to complete all bathing with min assist during session.  No use of LH sponge to wash her feet this session but she did need use of the reacher for removing gripper socks and for assisting with donning shoes.  Shoe funnel and sockaide also utilized.  She was able to donn her pants over her feet and pull up with min assist.  She also donned her socks with setup and use of the sockaide.  Mod assist with max demonstrational cueing for use of the shoe funnel and reacher to donn shoes with elastic laces.  Min assist for standing to brush her teeth.  Finished session with pt in the wheelchair with call button and phone in reach and safety belt and chair alarm in place.    Therapy Documentation Precautions:  Precautions Precautions: Fall Precaution Comments: Rt hemi Restrictions Weight Bearing Restrictions: No   Pain: Pain Assessment Pain Score: 0-No pain ADL: See Function Navigator for Current Functional Status.   Therapy/Group: Individual Therapy  Georg Ang OTR/L 05/03/2017, 8:57  AM

## 2017-05-03 NOTE — Telephone Encounter (Signed)
-----   Message from [place], RN sent at 04/11/2017  9:46 AM EST ----- Regarding: 2 month d/l  Patient advised to f/u with [facility] for instructions on fixing her cpap. Once she has started her cpap she will get a 2 month D/L.   Thanks  [employer]

## 2017-05-03 NOTE — Telephone Encounter (Signed)
Called patient to check the status of getting her machine fixed with [facility] there was no voice mail set up to leave a message.

## 2017-05-04 ENCOUNTER — Inpatient Hospital Stay (HOSPITAL_COMMUNITY): Payer: PPO | Admitting: Physical Therapy

## 2017-05-04 ENCOUNTER — Inpatient Hospital Stay (HOSPITAL_COMMUNITY): Payer: PPO | Admitting: Speech Pathology

## 2017-05-04 ENCOUNTER — Inpatient Hospital Stay (HOSPITAL_COMMUNITY): Payer: PPO | Admitting: Occupational Therapy

## 2017-05-04 DIAGNOSIS — G4733 Obstructive sleep apnea (adult) (pediatric): Secondary | ICD-10-CM

## 2017-05-04 NOTE — Progress Notes (Signed)
Occupational Therapy Session Note  Patient Details  Name: Tara Dunn MRN: [id number] Date of Birth: Apr 21, 1940  Today's Date: 05/04/2017 OT Individual Time: 1006-1050 OT Individual Time Calculation (min): 44 min    Short Term Goals: Week 2:  OT Short Term Goal 1 (Week 2): Pt will continue working toward supervision level goals for discharge.  Skilled Therapeutic Interventions/Progress Updates:   Session 1  (1006-1050) Pt completed bathing and dressing during session.  Min assist with use of the RW for ambulation to the walk-in shower.  She was able to complete all bathing sit to stand with min guard assist and use of grab bar for standing balance.  LH sponge also utilized for washing lower legs and feet.  She dried off sitting on the shower seat and then transferred to the wheelchair for dressing at the sink.  Min assist for donning brief and pants sit to stand.  Increased difficulty threading the LLE through after the right, but she succeeded with increased time.  Min assist needed for pulling pants up in the back.  She was able to use the sockaide for donning socks and therapist assisted with donning shoes secondary to decreased time.  Setup for oral hygiene from wheelchair as well.  Pt left at bedside in wheelchair with call button and phone in reach and safety belt in place.    Session 2:  (14:32-15:27)  Began session with practicing simulated walk-in shower transfers with use of the RW for support.  She was able to complete transfer backing over the edge of the shower and then turning to sit using the grab bar with min assist.  Once completed, took pt down to the dayroom in the wheelchair for use of the Nustep.  She completed 3 sets.  First set was completed for 5 mins with use of all extremities on level 7.  She was able to maintain 55-60 steps.  On the second and third sets she used only her LEs.  She was able to maintain 25-30 steps for a set of 2 mins and then 1 minute.  Finished  session with wheelchair mobility back to the room with increased time and supervision, in order to work on RUE coordination and strength.    Therapy Documentation Precautions:  Precautions Precautions: Fall Precaution Comments: Rt hemi Restrictions Weight Bearing Restrictions: No  Pain: Pain Assessment Pain Scale: 0-10 Pain Score: 0-No pain ADL: See Function Navigator for Current Functional Status.   Therapy/Group: Individual Therapy  Dimitri Dsouza OTR/L 05/04/2017, 3:38 PM

## 2017-05-04 NOTE — Progress Notes (Signed)
Pt refused cpap for the night. RT will continue to monitor as needed. 

## 2017-05-04 NOTE — Patient Care Conference (Signed)
Inpatient RehabilitationTeam Conference and Plan of Care Update Date: 05/04/2017   Time: 2:30 PM    Patient Name: Tara Dunn      Medical Record Number: [id number]  Date of Birth: Apr 09, 1940 Sex: Female         Room/Bed: 4M06C/4M06C-01 Payor Info: Payor: [employer] / Plan: [employer] Must / Product Type: *No Product type* /    Admitting Diagnosis: cva  Admit Date/Time:  04/22/2017  4:56 PM Admission Comments: No comment available   Primary Diagnosis:  Cerebrovascular accident (CVA) due to thrombosis of left middle cerebral artery ([place]) Principal Problem: Cerebrovascular accident (CVA) due to thrombosis of left middle cerebral artery [facility])  Patient Active Problem List   Diagnosis Date Noted  . Labile blood pressure   . Hypoalbuminemia due to protein-calorie malnutrition ([place])   . Acute blood loss anemia   . AKI (acute kidney injury) ([place])   . Peripheral edema   . Stage 3 chronic kidney disease ([place])   . Benign essential HTN   . History of CVA with residual deficit   . Cerebrovascular accident (CVA) due to thrombosis of left middle cerebral artery ([place])   . Right leg weakness 04/19/2017  . Right hemiparesis ([place]) 04/19/2017  . Thalamic infarct, acute ([place]) 04/19/2017  . Joint pain 12/09/2015  . ICD (implantable cardioverter-defibrillator) in place 03/26/2015  . Syncope 03/06/2015  . Obesity (BMI 30-39.9) 01/21/2015  . Excessive daytime sleepiness 08/04/2014  . OSA (obstructive sleep apnea) 08/04/2014  . Essential hypertension 06/13/2014  . Chronic systolic CHF (congestive heart failure) ([place]) 05/20/2014  . Congestive heart disease ([place])   . Chronic combined systolic and diastolic CHF (congestive heart failure) ([place]) 05/03/2014  . CKD (chronic kidney disease), stage III ([place]) 05/03/2014  . Hx of stroke without residual deficits 05/03/2014    Expected Discharge Date: Expected Discharge Date: 05/11/17  Team Members Present: Physician leading conference:  Dr. 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 Plan aspirin and Plavix 3 weeks then Plavix alone  Improve mobility, safety, bowel/bladder, CKD  See above   Bowel/Bladder   continent B&B; LBM 3/25  remain continent B&B  assess q shift and PRN   Swallow/Nutrition/ Hydration             ADL's   UB bathing and dressing with setup.  LB bathing with min assist and Min to mod assist for dressing.   Min assist for toilet transfers and shower transfers with use of the RW  Upgraded to supervision mostly  selfcare retraining, balance retraining, transfer training, functional mobility neuromuscular re-education, DME education, pt/family education   Mobility   minA bed mobility, transfers, gait with RW, stairs minA with rails  Upgraded to S overall, minA car transfers, modA stairs no rails for home entry  R NMR, activity tolerance, LE strengthening/ROM, transfer/gait training   Communication   min assist-supervision   supervision   continue to address education and carryover of compensatory strategies    Safety/Cognition/ Behavioral Observations  min-mod assist   min assist   continue to address safety awareness, memory, problem solving, attention, family education   Pain   Pt c/o  of hip pain occassionally; PRN acetaminophen, gabapentin 300mg  TID  pain <4  assess pain q shift and PRN   Skin   No skin issues noted  remain skin breakdown and infection free  assess skin q shift and PRN      *See Care Plan and progress notes for long and short-term goals.     Barriers to Discharge  Current Status/Progress  Possible Resolutions Date Resolved   Physician    Medical stability     See above  Therapies, follow weights, follow labs, encourage fluids      Nursing                  PT                    OT                  SLP                SW                Discharge Planning/Teaching Needs:  Home with daughter who can provide care. Daughter to come in and go through therapies with pt prior to discharge. [facility] follows at home      Team Discussion:  Progressing well in therapies, have upgraded goals to supervision level. CHF & BP stable. CKD encouraging fluids. Nursing working on continence-had urgency at home. Memory, problem solving and attention issues speech is working on. Family education tomorrow with daughter.   Revisions to Treatment Plan:  DC 4/3    Continued Need for Acute Rehabilitation Level of Care: The patient requires daily medical management by a physician with specialized training in physical medicine and rehabilitation for the following conditions: Daily direction of a multidisciplinary physical rehabilitation program to ensure safe treatment while eliciting the highest outcome that is of practical value to the patient.: Yes Daily medical management of patient stability for increased activity during participation in an intensive rehabilitation regime.: Yes Daily analysis of laboratory values and/or radiology reports with any subsequent need for medication adjustment of medical intervention for : Neurological problems;Renal problems;Cardiac problems  Elease Hashimoto 05/04/2017, 3:54 PM

## 2017-05-04 NOTE — Progress Notes (Signed)
[place] PHYSICAL MEDICINE & REHABILITATION     PROGRESS NOTE    Subjective/Complaints: Patient seen sitting up in bed this morning, eating breakfast. She states she slept well overnight.  ROS: Denies CP, SOB, N/V/D  Objective: Vital Signs: Blood pressure (!) 119/58, pulse 69, temperature 97.9 F (36.6 C), temperature source Oral, resp. rate 17, height 5\' 3"  (1.6 m), weight 100 kg (220 lb 7.4 oz), SpO2 99 %. No results found. Recent Labs    05/02/17 0938  WBC 5.1  HGB 10.2*  HCT 32.4*  PLT 222   Recent Labs    05/02/17 0938  NA 135  K 4.9  CL 102  GLUCOSE 100*  BUN 51*  CREATININE 1.80*  CALCIUM 10.6*   CBG (last 3)  No results for input(s): GLUCAP in the last 72 hours.  Wt Readings from Last 3 Encounters:  05/04/17 100 kg (220 lb 7.4 oz)  04/21/17 99 kg (218 lb 4.1 oz)  04/15/17 100.2 kg (221 lb)    Physical Exam:  Constitutional: No distress . Vital signs reviewed. HENT: Normocephalic, atraumatic Eyes: EOMI, No discharge Cardiovascular: RRR. No JVD    Respiratory: CTA Bilaterally. Normal effort    GI: BS +, non-distended  Musc: No edema or tenderness in extremities Neurological: Alertand oriented  Right central 7 with dysarthria reasonable insight and awareness.   Follows basic commands.   Motor: B/l UE grossly 4-4+/5 prox to distal (left stronger than right) RLE: 3+-4-/5 prox to distal (stable) LLE: 4--4/5 prox to distal (stable) Skin. Warm and dry Psych: Pleasant and cooperative  Assessment/Plan: 1.  Functional and mobility deficits secondary to left posterior limb internal capsule infarct which require 3+ hours per day of interdisciplinary therapy in a comprehensive inpatient rehab setting. Physiatrist is providing close team supervision and 24 hour management of active medical problems listed below. Physiatrist and rehab team continue to assess barriers to discharge/monitor patient progress toward functional and medical  goals.  Function:  Bathing Bathing position   Position: Wheelchair/chair at sink  Bathing parts Body parts bathed by patient: Right arm, Left arm, Chest, Abdomen, Front perineal area, Buttocks, Right upper leg, Left upper leg, Right lower leg, Left lower leg Body parts bathed by helper: Back  Bathing assist Assist Level: Touching or steadying assistance(Pt > 75%)      Upper Body Dressing/Undressing Upper body dressing   What is the patient wearing?: Pull over shirt/dress     Pull over shirt/dress - Perfomed by patient: Thread/unthread right sleeve, Thread/unthread left sleeve, Put head through opening, Pull shirt over trunk Pull over shirt/dress - Perfomed by helper: Pull shirt over trunk Button up shirt - Perfomed by patient: Thread/unthread right sleeve, Thread/unthread left sleeve, Pull shirt around back Button up shirt - Perfomed by helper: Button/unbutton shirt    Upper body assist Assist Level: Supervision or verbal cues      Lower Body Dressing/Undressing Lower body dressing   What is the patient wearing?: Pants, Socks, Shoes Underwear - Performed by patient: Thread/unthread left underwear leg Underwear - Performed by helper: Thread/unthread right underwear leg, Pull underwear up/down Pants- Performed by patient: Thread/unthread right pants leg, Thread/unthread left pants leg, Pull pants up/down Pants- Performed by helper: Thread/unthread right pants leg, Thread/unthread left pants leg     Socks - Performed by patient: Don/doff right sock, Don/doff left sock Socks - Performed by helper: Don/doff right sock, Don/doff left sock Shoes - Performed by patient: Don/doff left shoe(shoe funnel) Shoes - Performed by helper: Don/doff right  shoe, Don/doff left shoe          Lower body assist Assist for lower body dressing: Touching or steadying assistance (Pt > 75%)      Toileting Toileting Toileting activity did not occur: No continent bowel/bladder event Toileting steps  completed by patient: Adjust clothing prior to toileting, Adjust clothing after toileting Toileting steps completed by helper: Performs perineal hygiene, Adjust clothing after toileting Toileting Assistive Devices: Grab bar or rail  Toileting assist Assist level: Touching or steadying assistance (Pt.75%)   Transfers Chair/bed transfer   Chair/bed transfer method: Stand pivot Chair/bed transfer assist level: Touching or steadying assistance (Pt > 75%) Chair/bed transfer assistive device: Armrests, Medical sales representative     Max distance: 100' Assist level: Touching or steadying assistance (Pt > 75%)   Wheelchair   Type: Manual Max wheelchair distance: 50' Assist Level: Touching or steadying assistance (Pt > 75%)  Cognition Comprehension Comprehension assist level: Understands basic 90% of the time/cues < 10% of the time  Expression Expression assist level: Expresses basic 90% of the time/requires cueing < 10% of the time.  Social Interaction Social Interaction assist level: Interacts appropriately 90% of the time - Needs monitoring or encouragement for participation or interaction.  Problem Solving Problem solving assist level: Solves basic 50 - 74% of the time/requires cueing 25 - 49% of the time  Memory Memory assist level: Recognizes or recalls 75 - 89% of the time/requires cueing 10 - 24% of the time  Medical Problem List and Plan: 1.Right side weakness and dysarthriasecondary to lacunar infarction posterior limb ofleftinternal capsule secondary to small vessel disease. Plan aspirin and Plavix 3 weeks then Plavix alone Continue CIR  2. DVT Prophylaxis/Anticoagulation: started heparin, d/ced SCD. Monitor for any signs of DVT 3. Pain Management:Neurontin 300 mg 3 times a day, Tylenol as needed 4. Mood:Provide emotional support 5. Neuropsych: This patientiscapable of making decisions on herown behalf. 6. Skin/Wound Care:Routine skin checks 7.  Fluids/Electrolytes/Nutrition:Routine I&O's  8.Chronic combined systolic and diastolic congestive heart failure.   EJ FX 45-50% with Gr 1 DD Filed Weights   05/02/17 0453 05/03/17 0442 05/04/17 0500  Weight: 99.5 kg (219 lb 5.7 oz) 100.3 kg (221 lb 1.9 oz) 100 kg (220 lb 7.4 oz)    Lasix d/ced 9.Cardiomyopathy with AICD. Follow-up cardiology services    Weights stable on 3/27  10.Hypertension. Coreg 25 mg twice a day, BIDIL20-37.79milligrams twice a day Vitals:   05/03/17 2055 05/04/17 0500  BP: (!) 119/59 (!) 119/58  Pulse: 71 69  Resp:  17  Temp:  97.9 F (36.6 C)  SpO2: 99% 99%   Relatively controlled on 3/27 11.Chronic right lower extremity edema.  .  12. CKDstage III. Baseline creatinine 1.69.   Cr 1.80 on 3/25   Encourage fluids 13.Hyperlipidemia. Lipitor 14.GERD. Protonix 15. ABLA   Hb 10.2 on 3/25   Cont to monitor 16. Hypoalbuminemia   Supplement initiated on 3/20  LOS (Days) 12 A FACE TO FACE EVALUATION WAS PERFORMED  Torie Towle Lorie Phenix, MD 05/04/2017 8:29 AM

## 2017-05-04 NOTE — Progress Notes (Signed)
Physical Therapy Session Note  Patient Details  Name: Tara Dunn MRN: [id number] Date of Birth: 1940-11-07  Today's Date: 05/04/2017 PT Individual Time: 1100-1200 PT Individual Time Calculation (min): 60 min   Skilled Therapeutic Interventions/Progress Updates:   Pt received in w/c, agreeable to therapy. Pt denies pain. Pt propelled w/c ~50 ft for BUE strengthening and endurance with supervision and verbal cuing to maintain straight line. Pt performed car transfer mod A (SUV simulation), requiring therapist to place BLE in car. Pt required verbal cues for sequencing and hand placement during transfer. Therapist propelled pt in w/c to gym to perform stairs. Pt reports she has two stairs to enter and does not have railings at home, though it might be possible to install railings. Pt instructed to ask daughter about railing installation. Pt able to negotiate 4 stairs w/ use of bilateral railings and min A. Pt demonstrated decreased foot clearance on R. Pt educated on sequencing of stair negotiation. Pt performed sit<>stand min A with RW to mat for exercises. Pt performed marching in standing to facilitate weight shifting and BLE strengthening and demonstrated anterior lean and minimal R foot clearance. Pt transferred onto mat in prone position for bilat hip flexor stretches. Pt initiated quadruped position and required multiple attempts and max A +2 to assume position but was unable to hold position. Pt performed Kinetron seated in w/c x 24min, 60 cm/s, to increased endurance and activity tolerance. Therapist propelled pt back to room in w/c, was left seated with quick release belt and alarm on, and all needs within reach.    Therapy Documentation Precautions:  Precautions Precautions: Fall Precaution Comments: Rt hemi Restrictions Weight Bearing Restrictions: No  See Function Navigator for Current Functional Status.   Therapy/Group: Individual Therapy  Caffie Damme 05/04/2017, 12:23 PM

## 2017-05-04 NOTE — Progress Notes (Signed)
Speech Language Pathology Daily Session Note  Patient Details  Name: Tara Dunn MRN: [id number] Date of Birth: 06/16/1940  Today's Date: 05/04/2017 SLP Individual Time: 3009-2330 SLP Individual Time Calculation (min): 26 min  Short Term Goals: Week 2: SLP Short Term Goal 1 (Week 2): Patient will utilize a slow rate and over-articulation at the sentence level to achieve ~90% intelligibility with supervision cues.  SLP Short Term Goal 2 (Week 2): Patient will complete higher-level naming/word-finding tasks with supervision cues.  SLP Short Term Goal 3 (Week 2): Patient will demonstrate functional problem solving for basic and familiar tasks with Min A verbal cues.  SLP Short Term Goal 4 (Week 2): Patient will recognize and correct errors in the moment during functional tasks with mod assist verbal cues.  SLP Short Term Goal 5 (Week 2): Patient will utilize external memory aids to recall new, daily information with Min A verbal and visual cues.  SLP Short Term Goal 6 (Week 2): Patient will demonstrate selective attention to functional tasks in a moderately distracting enviornment for ~30 minutes with supervision verbal cues for redirection.   Skilled Therapeutic Interventions:   Pt was seen for skilled ST targeting cognitive goals.  Pt needed supervision question cues to recall therapist from last week as well as purpose of toileting schedule initiated last week.  Pt had not called to use the bathroom upon therapist's arrival and had been incontinent of urine.  Pt used walker safely during ambulation to the toilet with min assist.  SLP facilitated the session with supervision cues for use of a simplified therapy schedule to facilitate recall of upcoming therapy sessions.  Pt was returned to room and left in wheelchair with quick release belt donned and call bell within reach.  Continue per current plan of care.     Function:  Eating Eating                 Cognition Comprehension  Comprehension assist level: Follows basic conversation/direction with extra time/assistive device  Expression   Expression assist level: Expresses basic needs/ideas: With extra time/assistive device  Social Interaction Social Interaction assist level: Interacts appropriately 90% of the time - Needs monitoring or encouragement for participation or interaction.  Problem Solving Problem solving assist level: Solves basic 75 - 89% of the time/requires cueing 10 - 24% of the time  Memory Memory assist level: Recognizes or recalls 75 - 89% of the time/requires cueing 10 - 24% of the time    Pain Pain Assessment Pain Scale: 0-10 Pain Score: 0-No pain Faces Pain Scale: No hurt  Therapy/Group: Individual Therapy  Allie Ousley, Selinda Orion 05/04/2017, 12:14 PM

## 2017-05-05 ENCOUNTER — Ambulatory Visit (HOSPITAL_COMMUNITY): Payer: PPO | Admitting: Physical Therapy

## 2017-05-05 ENCOUNTER — Inpatient Hospital Stay (HOSPITAL_COMMUNITY): Payer: PPO | Admitting: Occupational Therapy

## 2017-05-05 ENCOUNTER — Inpatient Hospital Stay (HOSPITAL_COMMUNITY): Payer: PPO | Admitting: Speech Pathology

## 2017-05-05 NOTE — Plan of Care (Signed)
Pt remains incontinent of urine and stool Toilet program continues

## 2017-05-05 NOTE — Progress Notes (Signed)
[place] PHYSICAL MEDICINE & REHABILITATION     PROGRESS NOTE    Subjective/Complaints: Patient seen lying in bed this mor states she slept well overnight. She tells me no turn on the lights and get her ready for breakfast this morning.  ROS: Denies  CP, SOB, N/V/D  Objective: Vital Signs: Blood pressure (!) 110/53, pulse 79, temperature 98.3 F (36.8 C), temperature source Oral, resp. rate 16, height 5\' 3"  (1.6 m), weight 100 kg (220 lb 7.4 oz), SpO2 97 %. No results found. Recent Labs    05/02/17 0938  WBC 5.1  HGB 10.2*  HCT 32.4*  PLT 222   Recent Labs    05/02/17 0938  NA 135  K 4.9  CL 102  GLUCOSE 100*  BUN 51*  CREATININE 1.80*  CALCIUM 10.6*   CBG (last 3)  No results for input(s): GLUCAP in the last 72 hours.  Wt Readings from Last 3 Encounters:  05/04/17 100 kg (220 lb 7.4 oz)  04/21/17 99 kg (218 lb 4.1 oz)  04/15/17 100.2 kg (221 lb)    Physical Exam:  Constitutional: No distress . Vital signs reviewed. HENT: Normocephalic, atraumatic Eyes: EOMI, No discharge Cardiovascular: RRR. No JVD    Respiratory: CTA Bilaterally. Normal effort    GI: BS +, non-distended  Musc: No edema or tenderness in extremities Neurological: Alertand oriented  Right central 7 with mild dysarthria Follows basic commands.   Motor: B/l UE grossly 4-4+/5 prox to distal (left stronger than right) RLE: 3+-4-/5 prox to distal (unchanged) LLE: 4--4/5 prox to distal (unchanged) Skin. Warm and dry Psych: Pleasant and cooperative  Assessment/Plan: 1.  Functional and mobility deficits secondary to left posterior limb internal capsule infarct which require 3+ hours per day of interdisciplinary therapy in a comprehensive inpatient rehab setting. Physiatrist is providing close team supervision and 24 hour management of active medical problems listed below. Physiatrist and rehab team continue to assess barriers to discharge/monitor patient progress toward functional and medical  goals.  Function:  Bathing Bathing position   Position: Shower  Bathing parts Body parts bathed by patient: Right arm, Left arm, Chest, Abdomen, Front perineal area, Buttocks, Right upper leg, Left upper leg, Right lower leg, Left lower leg Body parts bathed by helper: Back  Bathing assist Assist Level: Touching or steadying assistance(Pt > 75%)      Upper Body Dressing/Undressing Upper body dressing   What is the patient wearing?: Pull over shirt/dress     Pull over shirt/dress - Perfomed by patient: Thread/unthread right sleeve, Thread/unthread left sleeve, Put head through opening, Pull shirt over trunk Pull over shirt/dress - Perfomed by helper: Pull shirt over trunk Button up shirt - Perfomed by patient: Thread/unthread right sleeve, Thread/unthread left sleeve, Pull shirt around back Button up shirt - Perfomed by helper: Button/unbutton shirt    Upper body assist Assist Level: Supervision or verbal cues      Lower Body Dressing/Undressing Lower body dressing   What is the patient wearing?: Pants, Socks, Shoes, Underwear Underwear - Performed by patient: Thread/unthread right underwear leg, Thread/unthread left underwear leg, Pull underwear up/down Underwear - Performed by helper: Thread/unthread right underwear leg, Pull underwear up/down Pants- Performed by patient: Thread/unthread right pants leg, Thread/unthread left pants leg Pants- Performed by helper: Pull pants up/down     Socks - Performed by patient: Don/doff right sock, Don/doff left sock Socks - Performed by helper: Don/doff right sock, Don/doff left sock Shoes - Performed by patient: Don/doff left shoe(shoe funnel) Shoes -  Performed by helper: Don/doff right shoe, Don/doff left shoe(Therapist assist secondary to decreased time)          Lower body assist Assist for lower body dressing: Touching or steadying assistance (Pt > 75%)      Toileting Toileting Toileting activity did not occur: No continent  bowel/bladder event Toileting steps completed by patient: Adjust clothing prior to toileting, Adjust clothing after toileting Toileting steps completed by helper: Performs perineal hygiene, Adjust clothing after toileting Toileting Assistive Devices: Grab bar or rail  Toileting assist Assist level: Touching or steadying assistance (Pt.75%)   Transfers Chair/bed transfer   Chair/bed transfer method: Stand pivot Chair/bed transfer assist level: Touching or steadying assistance (Pt > 75%) Chair/bed transfer assistive device: Armrests, Medical sales representative     Max distance: 100' Assist level: Touching or steadying assistance (Pt > 75%)   Wheelchair   Type: Manual Max wheelchair distance: 50 ft Assist Level: Supervision or verbal cues  Cognition Comprehension Comprehension assist level: Follows basic conversation/direction with extra time/assistive device  Expression Expression assist level: Expresses complex ideas: With extra time/assistive device  Social Interaction Social Interaction assist level: Interacts appropriately with others with medication or extra time (anti-anxiety, antidepressant).  Problem Solving Problem solving assist level: Solves basic 90% of the time/requires cueing < 10% of the time  Memory Memory assist level: Recognizes or recalls 75 - 89% of the time/requires cueing 10 - 24% of the time  Medical Problem List and Plan: 1.Right side weakness and dysarthriasecondary to lacunar infarction posterior limb ofleftinternal capsule secondary to small vessel disease. Plan aspirin and Plavix 3 weeks then Plavix alone Continue CIR  2. DVT Prophylaxis/Anticoagulation: started heparin, d/ced SCD. Monitor for any signs of DVT 3. Pain Management:Neurontin 300 mg 3 times a day, Tylenol as needed 4. Mood:Provide emotional support 5. Neuropsych: This patientiscapable of making decisions on herown behalf. 6. Skin/Wound Care:Routine skin checks 7.  Fluids/Electrolytes/Nutrition:Routine I&O's  8.Chronic combined systolic and diastolic congestive heart failure.   EJ FX 45-50% with Gr 1 DD Filed Weights   05/02/17 0453 05/03/17 0442 05/04/17 0500  Weight: 99.5 kg (219 lb 5.7 oz) 100.3 kg (221 lb 1.9 oz) 100 kg (220 lb 7.4 oz)    Lasix d/ced 9.Cardiomyopathy with AICD. Follow-up cardiology services    Weights stable on 3/28  10.Hypertension. Coreg 25 mg twice a day, BIDIL20-37.65milligrams twice a day Vitals:   05/04/17 1426 05/05/17 0531  BP: (!) 123/55 (!) 110/53  Pulse: 65 79  Resp: 18 16  Temp: 97.6 F (36.4 C) 98.3 F (36.8 C)  SpO2: 98% 97%   Relatively controlled on 3/28 11.Chronic right lower extremity edema.  .  12. CKDstage III. Baseline creatinine 1.69.   Cr 1.80 on 3/25   Encourage fluids 13.Hyperlipidemia. Lipitor 14.GERD. Protonix 15. ABLA   Hb 10.2 on 3/25   Cont to monitor 16. Hypoalbuminemia   Supplement initiated on 3/20  LOS (Days) 13 A FACE TO FACE EVALUATION WAS PERFORMED  Tara Dunn Tara Phenix, MD 05/05/2017 8:33 AM

## 2017-05-05 NOTE — Plan of Care (Signed)
  Problem: RH Car Transfers Goal: LTG Patient will perform car transfers with assist (PT) Description LTG: Patient will perform car transfers with assistance (PT). Flowsheets (Taken 05/05/2017 1330) LTG: Pt will perform car transfers with assist:: 4 - Minimal Assistance (downgraded d/t LE strength deficits, was minA at baseline) Note:  Downgraded d/t LE strength deficits, was minA at baseline    Problem: RH Stairs Goal: LTG Patient will ambulate up and down stairs w/assist (PT) Description LTG: Patient will ambulate up and down # of stairs with assistance (PT) Flowsheets (Taken 05/05/2017 1330) LTG: Pt will ambulate up/down stairs assist needed:: 5 - Supervision/cueing LTG: Pt will  ambulate up and down number of stairs: one step with RW for home entry (goal adjusted d/t clarification of home environment) Note:  Goal adjusted d/t clarification of home environment

## 2017-05-05 NOTE — Progress Notes (Signed)
Occupational Therapy Session Note  Patient Details  Name: Tara Dunn MRN: [id number] Date of Birth: Apr 24, 1940  Today's Date: 05/05/2017 OT Individual Time: 4403-4742 OT Individual Time Calculation (min): 49 min    Short Term Goals: Week 2:  OT Short Term Goal 1 (Week 2): Pt will continue working toward supervision level goals for discharge.  Skilled Therapeutic Interventions/Progress Updates:    Pt worked on sit to stand, standing balance, and RUE coordination while engaged in Wii activity.  Pt exhibited significant difficulty coordinating right arm and finger movements in order to complete bowling task.  She needed max demonstrational cueing for this as well as min assist for sit to stand and standing balance during the task.  Mod demonstrational cueing for sequencing sit to stand as she continues to try and stand without her RLE positioned under her.    Therapy Documentation Precautions:  Precautions Precautions: Fall Precaution Comments: Rt hemi Restrictions Weight Bearing Restrictions: No  Pain: Pain Assessment Pain Score: 0-No pain ADL:  See Function Navigator for Current Functional Status.   Therapy/Group: Individual Therapy  Adriahna [name] OTR/L 05/05/2017, 1:54 PM

## 2017-05-05 NOTE — Progress Notes (Signed)
Social Work Patient ID: Tara Dunn, female   DOB: 1940-11-16, 77 y.o.   MRN: [id number]  Met with pt and daughter who was here for education with therapy. It went very well and both feel confident abut discharge next Wed. Aware of team conference progress toward her goals and discharge 4/3. Pt will need a 3 in1 has all other pieces of equipment. Have informed [facility] of discharge and [facility] who is following in the community. Will continue to follow until next Wed.

## 2017-05-05 NOTE — Progress Notes (Signed)
Physical Therapy Session Note  Patient Details  Name: Tara Dunn MRN: [id number] Date of Birth: [birth year]  Today's Date: 05/05/2017 PT Individual Time: 1100-1200 PT Individual Time Calculation (min): 60 min   Short Term Goals: Week 2:  PT Short Term Goal 1 (Week 2): =LTG due to estimated LOS  Skilled Therapeutic Interventions/Progress Updates: Pt received seated in w/c, daughter present, denies pain and agreeable to treatment. Pt's daughter present to participate in session and receive education, discuss prior level of function and concerns going home. Gait x100' RW and min guard with slow speed but improving step length. Daughter reports pt near baseline, "she's always had trouble walking", and biggest concern is bathroom access and pt being able to get in and out of bed without assist. Performed ascent/descent one step with RW with min guard and cues for technique; daughter reports home entry is only one step not two as pt previously reported. Performed car transfer with S for transfer, modA for LE management. Daughter reports she was helping pt with LEs in/out of car before most recent stroke and she feels comfortable providing that level of assist. Sit>supine modA for BLE management to flat bed, supine>sit with S. Gait x125' with RW and close S, limited d/t fatigue. Performed ascent/descent 4 steps B handrails and alternating LE lead for strengthening, min guard. Stand pivot transfer w/c<>mat table close S with RW. 2x15 reps heel raises. 1x5 sit <>stand with S. Returned to room totalA in w/c. Gait in/out of bathroom with RW and close S. Pt with small incontinent bowel movement in brief; required totalA for changing clothing. Remained seated in w/c at end of session, daughter present and all needs in reach.      Therapy Documentation Precautions:  Precautions Precautions: Fall Precaution Comments: Rt hemi Restrictions Weight Bearing Restrictions: No Pain: Pain Assessment Pain Score:  0-No pain  See Function Navigator for Current Functional Status.   Therapy/Group: Individual Therapy  Luberta Mutter 05/05/2017, 12:59 PM

## 2017-05-05 NOTE — Progress Notes (Signed)
Occupational Therapy Session Note  Patient Details  Name: Tara Dunn MRN: [id number] Date of Birth: 08-05-1940  Today's Date: 05/05/2017 OT Individual Time: 1003-1103 OT Individual Time Calculation (min): 60 min    Short Term Goals: Week 2:  OT Short Term Goal 1 (Week 2): Pt will continue working toward supervision level goals for discharge.  Skilled Therapeutic Interventions/Progress Updates:    Pt completed bathing and dressing during session with daughter present for family education.  Pt was able to complete transfer into and out of the shower with min assist using the RW for support.  She continues to need mod demonstrational cueing to scoot forward to the edge of the chair and then position her RLE under her further prior to standing.  Discussed need for hand held shower and shower seat at home as well with daughter.  Pt completed all bathing with overall min assist sit to stand.  She did demonstrate some bowel incontinence, noted when she removed her clothing for the shower.  Discussed the need for timed toileting when she was at home as well to help possible decrease this.  Pt transferred from the shower to the wheelchair at the sink for dressing.  Supervision for donning pullover shirt with min assist for donning brief and pants sit to stand.  Pt utilized the reacher with assisting donning the LLE in the brief and when working on donning shoes.  She still needed max assist for donning both shoes with use of the shoe funnel as well.  Finished session with pt in the wheelchair and PT in for next session.    Therapy Documentation Precautions:  Precautions Precautions: Fall Precaution Comments: Rt hemi Restrictions Weight Bearing Restrictions: No  Pain: Pain Assessment Pain Scale: 0-10 Pain Score: 0-No pain ADL: See Function Navigator for Current Functional Status.   Therapy/Group: Individual Therapy  Emine Lopata OTR/L 05/05/2017, 12:29 PM

## 2017-05-05 NOTE — Progress Notes (Signed)
Speech Language Pathology Daily Session Note  Patient Details  Name: Nyema Hachey MRN: [id number] Date of Birth: Mar 22, 1940  Today's Date: 05/05/2017 SLP Individual Time: 0800-0830 SLP Individual Time Calculation (min): 30 min  Short Term Goals: Week 2: SLP Short Term Goal 1 (Week 2): Patient will utilize a slow rate and over-articulation at the sentence level to achieve ~90% intelligibility with supervision cues.  SLP Short Term Goal 2 (Week 2): Patient will complete higher-level naming/word-finding tasks with supervision cues.  SLP Short Term Goal 3 (Week 2): Patient will demonstrate functional problem solving for basic and familiar tasks with Min A verbal cues.  SLP Short Term Goal 4 (Week 2): Patient will recognize and correct errors in the moment during functional tasks with mod assist verbal cues.  SLP Short Term Goal 5 (Week 2): Patient will utilize external memory aids to recall new, daily information with Min A verbal and visual cues.  SLP Short Term Goal 6 (Week 2): Patient will demonstrate selective attention to functional tasks in a moderately distracting enviornment for ~30 minutes with supervision verbal cues for redirection.   Skilled Therapeutic Interventions:   Pt was seen for skilled ST targeting communication goals.  SLP facilitated the session with meal ordering task to address use of speech intelligibility strategies.  Pt completed task with mod I use of slow rate to achieve intelligibility at the phrase/short sentence level while ordering her lunch over the phone.  Pt was left in wheelchair with chair alarm set, quick release belt donned, and call bell within reach.  Continue per current plan of care.    Function:  Eating Eating                 Cognition Comprehension Comprehension assist level: Follows basic conversation/direction with extra time/assistive device  Expression   Expression assist level: Expresses complex ideas: With extra time/assistive  device  Social Interaction Social Interaction assist level: Interacts appropriately with others with medication or extra time (anti-anxiety, antidepressant).  Problem Solving Problem solving assist level: Solves basic 90% of the time/requires cueing < 10% of the time  Memory Memory assist level: Recognizes or recalls 75 - 89% of the time/requires cueing 10 - 24% of the time    Pain Pain Assessment Pain Scale: 0-10 Pain Score: 0-No pain  Therapy/Group: Individual Therapy  Loria Lacina, Selinda Orion 05/05/2017, 8:31 AM

## 2017-05-06 ENCOUNTER — Inpatient Hospital Stay (HOSPITAL_COMMUNITY): Payer: PPO | Admitting: Speech Pathology

## 2017-05-06 ENCOUNTER — Inpatient Hospital Stay (HOSPITAL_COMMUNITY): Payer: PPO

## 2017-05-06 ENCOUNTER — Other Ambulatory Visit (HOSPITAL_COMMUNITY): Payer: Self-pay

## 2017-05-06 ENCOUNTER — Inpatient Hospital Stay (HOSPITAL_COMMUNITY): Payer: PPO | Admitting: Physical Therapy

## 2017-05-06 NOTE — Progress Notes (Signed)
[facility]     PROGRESS NOTE    Subjective/Complaints: Patient seen working with therapies this morning. She is able to stand at sink and has good stand to sit balance.  ROS: denies CP, SOB, N/V/D  Objective: Vital Signs: Blood pressure (!) 120/59, pulse 73, temperature 98.4 F (36.9 C), temperature source Oral, resp. rate 19, height 5\' 3"  (1.6 m), weight 98.4 kg (216 lb 14.9 oz), SpO2 100 %. No results found. No results for input(s): WBC, HGB, HCT, PLT in the last 72 hours. No results for input(s): NA, K, CL, GLUCOSE, BUN, CREATININE, CALCIUM in the last 72 hours.  Invalid input(s): CO CBG (last 3)  No results for input(s): GLUCAP in the last 72 hours.  Wt Readings from Last 3 Encounters:  05/06/17 98.4 kg (216 lb 14.9 oz)  04/21/17 99 kg (218 lb 4.1 oz)  04/15/17 100.2 kg (221 lb)    Physical Exam:  Constitutional: No distress . Vital signs reviewed. HENT: Normocephalic, atraumatic Eyes: EOMI, No discharge Cardiovascular: RRR. No JVD    Respiratory: CTA Bilaterally. Normal effort    GI: BS +, non-distended  Musc: No edema or tenderness in extremities Neurological: Alertand oriented  Right central 7 with mild dysarthria Follows basic commands.   Motor: B/l UE grossly 4-4+/5 prox to distal (left stronger than right) RLE: 3+-4-/5 prox to distal (stable) LLE: 4--4/5 prox to distal (stable) Skin. Warm and dry Psych: Pleasant and cooperative  Assessment/Plan: 1.  Functional and mobility deficits secondary to left posterior limb internal capsule infarct which require 3+ hours per day of interdisciplinary therapy in a comprehensive inpatient rehab setting. Physiatrist is providing close team supervision and 24 hour management of active medical problems listed below. Physiatrist and rehab team continue to assess barriers to discharge/monitor patient progress toward functional and medical goals.  Function:  Bathing Bathing position    Position: Shower  Bathing parts Body parts bathed by patient: Right arm, Left arm, Chest, Abdomen, Front perineal area, Buttocks, Right upper leg, Left upper leg, Right lower leg, Left lower leg Body parts bathed by helper: Back  Bathing assist Assist Level: Touching or steadying assistance(Pt > 75%)      Upper Body Dressing/Undressing Upper body dressing   What is the patient wearing?: Pull over shirt/dress     Pull over shirt/dress - Perfomed by patient: Thread/unthread right sleeve, Thread/unthread left sleeve, Put head through opening, Pull shirt over trunk Pull over shirt/dress - Perfomed by helper: Pull shirt over trunk Button up shirt - Perfomed by patient: Thread/unthread right sleeve, Thread/unthread left sleeve, Pull shirt around back Button up shirt - Perfomed by helper: Button/unbutton shirt    Upper body assist Assist Level: Supervision or verbal cues      Lower Body Dressing/Undressing Lower body dressing   What is the patient wearing?: Pants, Socks, Shoes, Underwear Underwear - Performed by patient: Thread/unthread right underwear leg, Thread/unthread left underwear leg, Pull underwear up/down Underwear - Performed by helper: Thread/unthread right underwear leg, Pull underwear up/down Pants- Performed by patient: Thread/unthread right pants leg, Thread/unthread left pants leg, Pull pants up/down Pants- Performed by helper: Pull pants up/down     Socks - Performed by patient: Don/doff right sock, Don/doff left sock Socks - Performed by helper: Don/doff right sock, Don/doff left sock Shoes - Performed by patient: Don/doff left shoe(shoe funnel) Shoes - Performed by helper: Don/doff right shoe, Don/doff left shoe          Lower body assist Assist  for lower body dressing: Touching or steadying assistance (Pt > 75%)      Toileting Toileting Toileting activity did not occur: No continent bowel/bladder event Toileting steps completed by patient: Adjust clothing  prior to toileting, Adjust clothing after toileting Toileting steps completed by helper: Performs perineal hygiene, Adjust clothing after toileting Toileting Assistive Devices: Grab bar or rail  Toileting assist Assist level: Touching or steadying assistance (Pt.75%)   Transfers Chair/bed transfer   Chair/bed transfer method: Stand pivot Chair/bed transfer assist level: Supervision or verbal cues Chair/bed transfer assistive device: Armrests, Medical sales representative     Max distance: 125 Assist level: Touching or steadying assistance (Pt > 75%)   Wheelchair   Type: Manual Max wheelchair distance: 50 ft Assist Level: Supervision or verbal cues  Cognition Comprehension Comprehension assist level: Follows basic conversation/direction with extra time/assistive device  Expression Expression assist level: Expresses basic needs/ideas: With no assist  Social Interaction Social Interaction assist level: Interacts appropriately with others with medication or extra time (anti-anxiety, antidepressant).  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 Plan aspirin and Plavix 3 weeks then Plavix alone Continue CIR  2. DVT Prophylaxis/Anticoagulation: started heparin, d/ced SCD. Monitor for any signs of DVT 3. Pain Management:Neurontin 300 mg 3 times a day, Tylenol as needed 4. Mood:Provide emotional support 5. Neuropsych: This patientiscapable of making decisions on herown behalf. 6. Skin/Wound Care:Routine skin checks 7. Fluids/Electrolytes/Nutrition:Routine I&O's  8.Chronic combined systolic and diastolic  congestive heart failure.   EJ FX 45-50% with Gr 1 DD Filed Weights   05/03/17 0442 05/04/17 0500 05/06/17 0535  Weight: 100.3 kg (221 lb 1.9 oz) 100 kg (220 lb 7.4 oz) 98.4 kg (216 lb 14.9 oz)    Lasix d/ced 9.Cardiomyopathy with AICD. Follow-up cardiology services    Weights stable on 3/29 10.Hypertension. Coreg 25 mg twice a day, BIDIL20-37.53milligrams twice a day Vitals:   05/05/17 1444 05/06/17 0535  BP: 121/68 (!) 120/59  Pulse: 75 73  Resp: 18 19  Temp: 98 F (36.7 C) 98.4 F (36.9 C)  SpO2: 99% 100%   Relatively controlled on 3/29 11.Chronic right lower extremity edema.  .  12. CKDstage III. Baseline creatinine 1.69.   Cr 1.80 on 3/25   Encourage fluids  Labs ordered for Monday 13.Hyperlipidemia. Lipitor 14.GERD. Protonix 15. ABLA   Hb 10.2 on 3/25  Labs ordered for Monday   Cont to monitor 16. Hypoalbuminemia   Supplement initiated on 3/20  LOS (Days) 14 A FACE TO FACE EVALUATION WAS PERFORMED  Anquinette Pierro Lorie Phenix, MD 05/06/2017 8:39 AM

## 2017-05-06 NOTE — Progress Notes (Signed)
Occupational Therapy Session Note  Patient Details  Name: Tara Dunn MRN: [id number] Date of Birth: 16-Nov-1940  Today's Date: 05/06/2017 OT Individual Time: 6734-1937 OT Individual Time Calculation (min): 75 min    Short Term Goals: Week 1:  OT Short Term Goal 1 (Week 1): Pt will complete 1/3 components of donning pants OT Short Term Goal 1 - Progress (Week 1): Met OT Short Term Goal 2 (Week 1): Pt will complete 1 grooming task in standing to improve balance OT Short Term Goal 2 - Progress (Week 1): Met OT Short Term Goal 3 (Week 1): Pt will complete 1/3 components of toileting  OT Short Term Goal 3 - Progress (Week 1): Met  Skilled Therapeutic Interventions/Progress Updates:    1;1. Pt agreeable to bathing and dressing at sink level today with focus on AE use. Pt supine>sitting EOB with touching A for trunk elevation. Pt stand pivot transfer to w/c from EOB with MOD A for lifitng. Pt bathes at sit to stand level with touhcing A during buttock hygiene/peri care. Pt able to bend forward and reach feet for washing while stabilizing on sink. Pt dresses UB with set up demo R inattention as she does not advance sleeve past elbow without VC. Pt able to thread BLE into pants however requires A to advance pants past hips. Pt uses sock aide, reacher and shoe funnel to don socks and shoes with supervision and VC for technique and to flex knee to improve positioning to push foot into shoe. Pt stands at sink to brush teeth with touching A for sit to stand. Pt eats breakfast seated at standard table/chair in dayroom with VC for using knife and fork to cut french toast for RUE use as opposed to eating it with fingers. PT completes stand pivot trnasfer with RS back to w/c as stated above and exited session with pt seated in w/c set up with breakfast and call light in reach.   Therapy Documentation Precautions:  Precautions Precautions: Fall Precaution Comments: Rt hemi Restrictions Weight Bearing  Restrictions: No  See Function Navigator for Current Functional Status.   Therapy/Group: Individual Therapy  Tonny Branch 05/06/2017, 7:59 AM

## 2017-05-06 NOTE — Progress Notes (Signed)
Speech Language Pathology Weekly Progress and Session Note  Patient Details  Name: Tara Dunn MRN: [id number] Date of Birth: 1940-04-03  Beginning of progress report period:  April 29, 2017  End of progress report period: May 06, 2017   Today's Date: 05/06/2017 SLP Individual Time: 4656-8127 SLP Individual Time Calculation (min): 45 min  Short Term Goals: Week 2: SLP Short Term Goal 1 (Week 2): Patient will utilize a slow rate and over-articulation at the sentence level to achieve ~90% intelligibility with supervision cues.  SLP Short Term Goal 1 - Progress (Week 2): Met SLP Short Term Goal 2 (Week 2): Patient will complete higher-level naming/word-finding tasks with supervision cues.  SLP Short Term Goal 2 - Progress (Week 2): Met SLP Short Term Goal 3 (Week 2): Patient will demonstrate functional problem solving for basic and familiar tasks with Min A verbal cues.  SLP Short Term Goal 3 - Progress (Week 2): Met SLP Short Term Goal 4 (Week 2): Patient will recognize and correct errors in the moment during functional tasks with mod assist verbal cues.  SLP Short Term Goal 4 - Progress (Week 2): Met SLP Short Term Goal 5 (Week 2): Patient will utilize external memory aids to recall new, daily information with Min A verbal and visual cues.  SLP Short Term Goal 5 - Progress (Week 2): Progressing toward goal SLP Short Term Goal 6 (Week 2): Patient will demonstrate selective attention to functional tasks in a moderately distracting enviornment for ~30 minutes with supervision verbal cues for redirection.  SLP Short Term Goal 6 - Progress (Week 2): Met    New Short Term Goals: Week 3: SLP Short Term Goal 1 (Week 3): STG=LTG due to remaining length of stay   Weekly Progress Updates:   Pt has made functional gains this reporting period and has met 5 out of 6 short term goals.  Pt is currently min-mod assist for tasks due to mild cognitive impairment.  Pt has demonstrated improved  problem solving and emergent awareness this reporting period.   Pt and family education is ongoing.  Pt would continue to benefit from skilled ST while inpatient in order to maximize functional independence and reduce burden of care prior to discharge.  Anticipate that pt will need 24/7 supervision at discharge in addition to [place] follow at next level of care.      Intensity: Minumum of 1-2 x/day, 30 to 90 minutes Frequency: 3 to 5 out of 7 days Duration/Length of Stay: 14-18 days  Treatment/Interventions: Cognitive remediation/compensation;Environmental controls;Internal/external aids;Speech/Language facilitation;Therapeutic Activities;Patient/family education;Functional tasks;Cueing hierarchy   Daily Session  Skilled Therapeutic Interventions: Pt was seen for skilled ST targeting cognitive goals.  Pt had not been to the bathroom "in a while" but was continent of urine with min cues to attempt to go the bathroom prior to it becoming urgent.  SLP facilitated the session with a deductive reasoning puzzle to address problem solving goals.  Pt completed task with max faded to mod assist verbal cues for task organization and working memory of task rules and procedures. SLP also facilitated the session with a novel card game.  Pt could plan and execute a simple problem solving strategy with overall supervision.  Pt was returned to room and left in wheelchair with quick release belt donned and call bell within reach.  Continue per current plan of care.        Function:   Eating Eating  Cognition Comprehension Comprehension assist level: Follows basic conversation/direction with extra time/assistive device  Expression   Expression assist level: Expresses basic needs/ideas: With no assist  Social Interaction Social Interaction assist level: Interacts appropriately with others with medication or extra time (anti-anxiety, antidepressant).  Problem Solving Problem solving assist  level: Solves basic 75 - 89% of the time/requires cueing 10 - 24% of the time  Memory Memory assist level: Recognizes or recalls 75 - 89% of the time/requires cueing 10 - 24% of the time   General    Pain Pain Assessment Pain Score: 0-No pain  Therapy/Group: Individual Therapy  Chevelle Coulson, Selinda Orion 05/06/2017, 1:36 PM

## 2017-05-06 NOTE — Progress Notes (Signed)
Physical Therapy Session Note  Patient Details  Name: Tara Dunn MRN: [id number] Date of Birth: 08/02/1940  Today's Date: 05/06/2017 PT Individual Time: 6301-6010 PT Individual Time Calculation (min): 29 min   Short Term Goals: Week 1:  PT Short Term Goal 1 (Week 1): Pt will perform sit to stand with mod A x 1 with RW from wheelchair. PT Short Term Goal 1 - Progress (Week 1): Met PT Short Term Goal 2 (Week 1): Pt will transfer w/c to mat with mod A x 1 and LRAD. PT Short Term Goal 2 - Progress (Week 1): Met PT Short Term Goal 3 (Week 1): Pt will ambulate 10 ft with mod A x 1 with RW. PT Short Term Goal 3 - Progress (Week 1): Met Week 2:  PT Short Term Goal 1 (Week 2): =LTG due to estimated LOS Week 3:     Skilled Therapeutic Interventions/Progress Updates:   Pt received sitting in WC and agreeable to PT  Pt transported to rehab gym in [facility]. Gait training 31f with RW x 2 with min assist from PT. Min cues and facilitation from PT to improve hip flexion and knee flexion to reduce foot drag on the RLE.    Sit<>stand x 2 prior to each bout of gait training with min assist for safety, as well as improved anterior weight shift and proper set up with BLE below COM.    Pt returned to room and performed stand pivot transfer to bed with min assist. Sit>supine completed with mod assist to facilitate proper LE sequencing. Pt left supine in bed with call bell in reach and all needs met.          Therapy Documentation Precautions:  Precautions Precautions: Fall Precaution Comments: Rt hemi Restrictions Weight Bearing Restrictions: No   Pain: denies.   See Function Navigator for Current Functional Status.   Therapy/Group: Individual Therapy  ALorie Phenix3/29/2019, 2:45 PM

## 2017-05-06 NOTE — Progress Notes (Signed)
Physical Therapy Session Note  Patient Details  Name: Tara Dunn MRN: [id number] Date of Birth: July 20, 1940  Today's Date: 05/06/2017 PT Individual Time: 0900-1000 PT Individual Time Calculation (min): 60 min   Short Term Goals: Week 2:  PT Short Term Goal 1 (Week 2): =LTG due to estimated LOS  Skilled Therapeutic Interventions/Progress Updates: Pt received seated in w/c, denies pain and agreeable to treatment. Performed litegait x3 trials, first trial 1 min 40 sec, 0.6 mph, total 80'. Second trial 2:00, 0.6>0.40mph total 116'. Third trial x2:15 total 120'. Therapist facilitates lateral weight shift bilaterally, ace wrap RLE donned for dorsiflexion assist with minimal improvement in gait quality. Speed gradually increased to facilitate increased step length and normalized gait mechanics. Side stepping R/L and backwards walking in parallel bars with min guard 3x10 each direction, cues for maintaining neutral pelvic alignment to facilitate hip abduction strengthening. Remained seated in w/c at end of session, quick release belt intact and all needs in reach.      Therapy Documentation Precautions:  Precautions Precautions: Fall Precaution Comments: Rt hemi Restrictions Weight Bearing Restrictions: No   See Function Navigator for Current Functional Status.   Therapy/Group: Individual Therapy  Luberta Mutter 05/06/2017, 9:56 AM

## 2017-05-07 ENCOUNTER — Inpatient Hospital Stay (HOSPITAL_COMMUNITY): Payer: PPO | Admitting: Physical Therapy

## 2017-05-07 ENCOUNTER — Inpatient Hospital Stay (HOSPITAL_COMMUNITY): Payer: PPO | Admitting: Occupational Therapy

## 2017-05-07 NOTE — Progress Notes (Signed)
[facility] PHYSICAL MEDICINE & REHABILITATION     PROGRESS NOTE    Subjective/Complaints:  No Issues overnite  ROS: denies CP, SOB, N/V/D  Objective: Vital Signs: Blood pressure 116/69, pulse 81, temperature 98.1 F (36.7 C), temperature source Oral, resp. rate 17, height 5\' 3"  (1.6 m), weight 99.9 kg (220 lb 3.2 oz), SpO2 98 %. No results found. No results for input(s): WBC, HGB, HCT, PLT in the last 72 hours. No results for input(s): NA, K, CL, GLUCOSE, BUN, CREATININE, CALCIUM in the last 72 hours.  Invalid input(s): CO CBG (last 3)  No results for input(s): GLUCAP in the last 72 hours.  Wt Readings from Last 3 Encounters:  05/07/17 99.9 kg (220 lb 3.2 oz)  04/21/17 99 kg (218 lb 4.1 oz)  04/15/17 100.2 kg (221 lb)    Physical Exam:  Constitutional: No distress . Vital signs reviewed. HENT: Normocephalic, atraumatic Eyes: EOMI, No discharge Cardiovascular: RRR. No JVD    Respiratory: CTA Bilaterally. Normal effort    GI: BS +, non-distended  Musc: No edema or tenderness in extremities Neurological: Alertand oriented  Right central 7 with mild dysarthria Follows basic commands.   Motor: B/l UE grossly 4-4+/5 prox to distal (left stronger than right) RLE: 3+-4-/5 prox to distal (stable) LLE: 4--4/5 prox to distal (stable) Skin. Warm and dry Psych: Pleasant and cooperative  Assessment/Plan: 1.  Functional and mobility deficits secondary to left posterior limb internal capsule infarct which require 3+ hours per day of interdisciplinary therapy in a comprehensive inpatient rehab setting. Physiatrist is providing close team supervision and 24 hour management of active medical problems listed below. Physiatrist and rehab team continue to assess barriers to discharge/monitor patient progress toward functional and medical goals.  Function:  Bathing Bathing position   Position: Shower  Bathing parts Body parts bathed by patient: Right arm, Left arm, Chest, Abdomen,  Front perineal area, Buttocks, Right upper leg, Left upper leg, Right lower leg, Left lower leg Body parts bathed by helper: Back  Bathing assist Assist Level: Touching or steadying assistance(Pt > 75%)      Upper Body Dressing/Undressing Upper body dressing   What is the patient wearing?: Pull over shirt/dress     Pull over shirt/dress - Perfomed by patient: Thread/unthread right sleeve, Thread/unthread left sleeve, Put head through opening, Pull shirt over trunk Pull over shirt/dress - Perfomed by helper: Pull shirt over trunk Button up shirt - Perfomed by patient: Thread/unthread right sleeve, Thread/unthread left sleeve, Pull shirt around back Button up shirt - Perfomed by helper: Button/unbutton shirt    Upper body assist Assist Level: Supervision or verbal cues      Lower Body Dressing/Undressing Lower body dressing   What is the patient wearing?: Pants, Socks, Shoes, Underwear Underwear - Performed by patient: Thread/unthread right underwear leg, Thread/unthread left underwear leg, Pull underwear up/down Underwear - Performed by helper: Thread/unthread right underwear leg, Pull underwear up/down Pants- Performed by patient: Thread/unthread right pants leg, Thread/unthread left pants leg, Pull pants up/down Pants- Performed by helper: Pull pants up/down     Socks - Performed by patient: Don/doff right sock, Don/doff left sock Socks - Performed by helper: Don/doff right sock, Don/doff left sock Shoes - Performed by patient: Don/doff left shoe(shoe funnel) Shoes - Performed by helper: Don/doff right shoe, Don/doff left shoe          Lower body assist Assist for lower body dressing: Touching or steadying assistance (Pt > 75%)      Toileting Toileting  Toileting activity did not occur: No continent bowel/bladder event Toileting steps completed by patient: Adjust clothing prior to toileting, Adjust clothing after toileting Toileting steps completed by helper: Performs  perineal hygiene, Adjust clothing after toileting Toileting Assistive Devices: Grab bar or rail  Toileting assist Assist level: Touching or steadying assistance (Pt.75%)   Transfers Chair/bed transfer   Chair/bed transfer method: Stand pivot Chair/bed transfer assist level: Supervision or verbal cues Chair/bed transfer assistive device: Armrests, Medical sales representative     Max distance: 125 Assist level: Touching or steadying assistance (Pt > 75%)   Wheelchair   Type: Manual Max wheelchair distance: 50 ft Assist Level: Supervision or verbal cues  Cognition Comprehension Comprehension assist level: Follows basic conversation/direction with extra time/assistive device  Expression Expression assist level: Expresses basic needs/ideas: With no assist  Social Interaction Social Interaction assist level: Interacts appropriately with others with medication or extra time (anti-anxiety, antidepressant).  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 Plan aspirin and Plavix 3 weeks then Plavix alone (~05/10/17) Continue CIR PT, OT,SLP 2. DVT Prophylaxis/Anticoagulation: started heparin, d/ced SCD. Monitor for any signs of DVT 3. Pain Management:Neurontin 300 mg 3 times a day, Tylenol as needed 4. Mood:Provide emotional support 5. Neuropsych: This patientiscapable of making decisions on herown behalf. 6. Skin/Wound Care:Routine skin checks 7. Fluids/Electrolytes/Nutrition:Routine I&O's  8.Chronic combined systolic and diastolic congestive heart failure.   EJ FX 45-50% with Gr 1 DD Filed Weights   05/04/17 0500  05/06/17 0535 05/07/17 0414  Weight: 100 kg (220 lb 7.4 oz) 98.4 kg (216 lb 14.9 oz) 99.9 kg (220 lb 3.2 oz)    Lasix d/ced 9.Cardiomyopathy with AICD. Follow-up cardiology services    Weights stable on 3/30 10.Hypertension. Coreg 25 mg twice a day, BIDIL20-37.55milligrams twice a day Vitals:   05/06/17 1500 05/07/17 0414  BP: (!) 129/59 116/69  Pulse: 71 81  Resp: 18 17  Temp: 98 F (36.7 C) 98.1 F (36.7 C)  SpO2: 100% 98%   Relatively controlled on 3/30 11.Chronic right lower extremity edema.  .  12. CKDstage III. Baseline creatinine 1.69.   Cr 1.80 on 3/25   Encourage fluids  Labs ordered for Monday 13.Hyperlipidemia. Lipitor 14.GERD. Protonix 15. ABLA   Hb 10.2 on 3/25  Labs ordered for Monday   Cont to monitor 16. Hypoalbuminemia   Supplement initiated on 3/20  LOS (Days) [place] EVALUATION WAS PERFORMED  Charlett Blake, MD 05/07/2017 7:52 AM

## 2017-05-07 NOTE — Progress Notes (Signed)
Occupational Therapy Session Note  Patient Details  Name: Tara Dunn MRN: [id number] Date of Birth: Feb 04, 1941  Today's Date: 05/07/2017 OT Individual Time: 1001-1059 OT Individual Time Calculation (min): 58 min    Short Term Goals: Week 2:  OT Short Term Goal 1 (Week 2): Pt will continue working toward supervision level goals for discharge.  Skilled Therapeutic Interventions/Progress Updates:    Pt completed functional mobility to the shower for bathing with min assist.  She continues to need mod instructional cueing to remember to scoot forward and then position her feet underneath her prior to standing.  She was able to complete bathing sit to stand during bathing with supervision.  Therapist assisted with washing her back but she was able to wash her feet without using the LH sponge.  She transferred out to the wheelchair for dressing at the sink.  Reacher used for donning brief over feet but then she could donn her pants without it.  Min guard assist for sit to stand to pull them up over her hips.  She was able to use the sockaide for donning her socks and then the reacher and shoe funnel for donning shoes.  Finished session with functional mobility with the RW with min guard assist to the nurses station and back with 2-3 minute rest break in between.  Pt brought back to room and transferred to the toilet at end of session.  Daughter present to call for assist when she is ready to get up.    Therapy Documentation Precautions:  Precautions Precautions: Fall Precaution Comments: Rt hemi Restrictions Weight Bearing Restrictions: No Pain: Pain Assessment Pain Scale: Faces Pain Score: 0-No pain Pain Type: Acute pain Pain Orientation: Right Pain Descriptors / Indicators: Aching Pain Onset: With Activity ADL: See Function Navigator for Current Functional Status.   Therapy/Group: Individual Therapy  Fynn Adel OTR/L 05/07/2017, 12:33 PM

## 2017-05-07 NOTE — Progress Notes (Signed)
Physical Therapy Session Note  Patient Details  Name: Tara Dunn MRN: [id number] Date of Birth: 26-Jan-1941  Today's Date: 05/07/2017 PT Individual Time: 1500-1530 PT Individual Time Calculation (min): 30 min   Short Term Goals: Week 2:  PT Short Term Goal 1 (Week 2): =LTG due to estimated LOS  Skilled Therapeutic Interventions/Progress Updates: Pt presented in w/c requesting to use bathroom. Pt transported to toilet and performed stand pivot from w/c to toilet using wall rail to pull self up. Performed clothing management with minA due to urgency. Pt noted to have blood on front of brief, nsg notified however no spot currently bleeding. Pt indicated sometimes continues to bleed after injections in stomach. PTA changed brief and pt returned to w/c in same manner as prior. Pt transported to rehab gym and performed sit to stand in front of parallel bars with cues for increasing anterior wt shifting and hand placement. Pt performed standing march at bar for wt shifting and strengthening. Pt indicating wants to try walking without AD. Discussed current limitations due to CVA. Performed ambulation in parallel bars with pt gently resting hands on bars, then attempting with single UE support, then no UE support. Pt noted to have poor foot clearance/shuffling steps. Pt with better understanding of use of RW for safety and energy conservation. Pt returned to room at end of session and remained in w/c with chair alarm set and needs met.      Therapy Documentation Precautions:  Precautions Precautions: Fall Precaution Comments: Rt hemi Restrictions Weight Bearing Restrictions: No General:   Vital Signs:   Pain: Pain Assessment Pain Scale: Faces Pain Score: 0-No pain Pain Type: Acute pain Pain Orientation: Right Pain Descriptors / Indicators: Aching Pain Onset: With Activity   See Function Navigator for Current Functional Status.   Therapy/Group: Individual Therapy  Jashayla Glatfelter   Carlesha Seiple, PTA  05/07/2017, 3:48 PM

## 2017-05-08 ENCOUNTER — Inpatient Hospital Stay (HOSPITAL_COMMUNITY): Payer: PPO

## 2017-05-08 NOTE — Progress Notes (Signed)
Physical Therapy Session Note  Patient Details  Name: Tara Dunn MRN: [id number] Date of Birth: 1940/07/25  Today's Date: 05/08/2017 PT Individual Time: 4037-0964 PT Individual Time Calculation (min): 30 min   Short Term Goals: Week 2:  PT Short Term Goal 1 (Week 2): =LTG due to estimated LOS  Skilled Therapeutic Interventions/Progress Updates:    Pt seated in w/c upon PT arrival, agreeable to therapy tx and denies pain. Pt transported to gym in w/c, total assist for energy conservation and time management. Pt ambulated x 85 ft with RW and min assist, manual facilitation for L lateral weightshift and verbal cues for R foot clearance. Pt standing on wedge worked on dynamic balance and hip/trunk extension while reaching overhead for horseshoe and then tossing, manual facilitation for upright posture, min to mod assist to maintain balance and prevent posterior LOB. Pt performed x 5 sit<>stands without AD, emphasis on full extension upon standing and eccentric control with sitting. Pt transported back to room and left seated with needs in reach and QRB in place.   Therapy Documentation Precautions:  Precautions Precautions: Fall Precaution Comments: Rt hemi Restrictions Weight Bearing Restrictions: No   See Function Navigator for Current Functional Status.   Therapy/Group: Individual Therapy  Netta Corrigan, PT, DPT 05/08/2017, 12:11 PM

## 2017-05-08 NOTE — Progress Notes (Signed)
Pt stated she doesn't want to wear CPAP tonight.

## 2017-05-08 NOTE — Progress Notes (Signed)
[place] PHYSICAL MEDICINE & REHABILITATION     PROGRESS NOTE    Subjective/Complaints:  No Issues overnite  ROS: denies CP, SOB, N/V/D  Objective: Vital Signs: Blood pressure (!) 131/54, pulse 93, temperature 98.4 F (36.9 C), temperature source Oral, resp. rate 18, height 5\' 3"  (1.6 m), weight 99.1 kg (218 lb 7.6 oz), SpO2 100 %. No results found. No results for input(s): WBC, HGB, HCT, PLT in the last 72 hours. No results for input(s): NA, K, CL, GLUCOSE, BUN, CREATININE, CALCIUM in the last 72 hours.  Invalid input(s): CO CBG (last 3)  No results for input(s): GLUCAP in the last 72 hours.  Wt Readings from Last 3 Encounters:  05/08/17 99.1 kg (218 lb 7.6 oz)  04/21/17 99 kg (218 lb 4.1 oz)  04/15/17 100.2 kg (221 lb)    Physical Exam:  Constitutional: No distress . Vital signs reviewed. HENT: Normocephalic, atraumatic Eyes: EOMI, No discharge Cardiovascular: RRR. No JVD    Respiratory: CTA Bilaterally. Normal effort    GI: BS +, non-distended  Musc: No edema or tenderness in extremities Neurological: Alertand oriented  Right central 7 with mild dysarthria Follows basic commands.   Motor: B/l UE grossly 4-4+/5 prox to distal (left stronger than right) RLE: 3+-4-/5 prox to distal (stable) LLE: 4--4/5 prox to distal (stable) Skin. Warm and dry Psych: Pleasant and cooperative  Assessment/Plan: 1.  Functional and mobility deficits secondary to left posterior limb internal capsule infarct which require 3+ hours per day of interdisciplinary therapy in a comprehensive inpatient rehab setting. Physiatrist is providing close team supervision and 24 hour management of active medical problems listed below. Physiatrist and rehab team continue to assess barriers to discharge/monitor patient progress toward functional and medical goals.  Function:  Bathing Bathing position   Position: Shower  Bathing parts Body parts bathed by patient: Right arm, Left arm, Chest,  Abdomen, Front perineal area, Buttocks, Right upper leg, Left upper leg, Right lower leg, Left lower leg Body parts bathed by helper: Back  Bathing assist Assist Level: Touching or steadying assistance(Pt > 75%)      Upper Body Dressing/Undressing Upper body dressing   What is the patient wearing?: Pull over shirt/dress     Pull over shirt/dress - Perfomed by patient: Thread/unthread right sleeve, Thread/unthread left sleeve, Put head through opening, Pull shirt over trunk Pull over shirt/dress - Perfomed by helper: Pull shirt over trunk Button up shirt - Perfomed by patient: Thread/unthread right sleeve, Thread/unthread left sleeve, Pull shirt around back Button up shirt - Perfomed by helper: Button/unbutton shirt    Upper body assist Assist Level: Supervision or verbal cues      Lower Body Dressing/Undressing Lower body dressing   What is the patient wearing?: Pants, Socks, Shoes, Underwear Underwear - Performed by patient: Thread/unthread right underwear leg, Thread/unthread left underwear leg, Pull underwear up/down Underwear - Performed by helper: Thread/unthread right underwear leg, Pull underwear up/down Pants- Performed by patient: Thread/unthread right pants leg, Thread/unthread left pants leg, Pull pants up/down Pants- Performed by helper: Pull pants up/down     Socks - Performed by patient: Don/doff right sock, Don/doff left sock Socks - Performed by helper: Don/doff right sock, Don/doff left sock Shoes - Performed by patient: Don/doff left shoe, Don/doff right shoe Shoes - Performed by helper: Don/doff right shoe, Don/doff left shoe          Lower body assist Assist for lower body dressing: Touching or steadying assistance (Pt > 75%)  Toileting Toileting Toileting activity did not occur: No continent bowel/bladder event Toileting steps completed by patient: Adjust clothing prior to toileting, Adjust clothing after toileting Toileting steps completed by helper:  Adjust clothing prior to toileting, Performs perineal hygiene, Adjust clothing after toileting Toileting Assistive Devices: Grab bar or rail  Toileting assist Assist level: Touching or steadying assistance (Pt.75%)   Transfers Chair/bed transfer   Chair/bed transfer method: Stand pivot Chair/bed transfer assist level: Supervision or verbal cues Chair/bed transfer assistive device: Armrests, Medical sales representative     Max distance: 100' Assist level: Touching or steadying assistance (Pt > 75%)   Wheelchair   Type: Manual Max wheelchair distance: 50 ft Assist Level: Supervision or verbal cues  Cognition Comprehension Comprehension assist level: Follows basic conversation/direction with no assist  Expression Expression assist level: Expresses basic needs/ideas: With no assist  Social Interaction Social Interaction assist level: Interacts appropriately 90% of the time - Needs monitoring or encouragement for participation or interaction.  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 Plan aspirin and Plavix 3 weeks then Plavix alone (~05/10/17) Continue CIR PT, OT,SLP 2. DVT Prophylaxis/Anticoagulation: started heparin, d/ced SCD. Monitor for any signs of DVT 3. Pain Management:Neurontin 300 mg 3 times a day, Tylenol as needed 4. Mood:Provide emotional support 5. Neuropsych: This patientiscapable of making decisions on herown behalf. 6. Skin/Wound Care:Routine skin checks 7. Fluids/Electrolytes/Nutrition:Routine I&O's  8.Chronic combined systolic and diastolic congestive heart failure.   EJ FX 45-50% with Gr 1 DD Filed Weights   05/06/17 0535  05/07/17 0414 05/08/17 0539  Weight: 98.4 kg (216 lb 14.9 oz) 99.9 kg (220 lb 3.2 oz) 99.1 kg (218 lb 7.6 oz)    Lasix d/ced 9.Cardiomyopathy with AICD. Follow-up cardiology services    Weights stable on 3/31 10.Hypertension. Coreg 25 mg twice a day, BIDIL20-37.65milligrams twice a day Vitals:   05/07/17 1800 05/08/17 0539  BP: 127/73 (!) 131/54  Pulse: 83 93  Resp:  18  Temp:  98.4 F (36.9 C)  SpO2:  100%   Relatively controlled on 3/31 11.Chronic right lower extremity edema.  .  12. CKDstage III. Baseline creatinine 1.69.   Cr 1.80 on 3/25   Encourage fluids  Labs ordered for Monday 13.Hyperlipidemia. Lipitor 14.GERD. Protonix 15. ABLA   Hb 10.2 on 3/25  Labs ordered for Monday   Cont to monitor 16. Hypoalbuminemia   Supplement initiated on 3/20  LOS (Days) 16 A FACE TO FACE EVALUATION WAS PERFORMED  Charlett Blake, MD 05/08/2017 7:58 AM

## 2017-05-09 ENCOUNTER — Inpatient Hospital Stay (HOSPITAL_COMMUNITY): Payer: PPO | Admitting: Physical Therapy

## 2017-05-09 ENCOUNTER — Inpatient Hospital Stay (HOSPITAL_COMMUNITY): Payer: PPO | Admitting: Occupational Therapy

## 2017-05-09 ENCOUNTER — Telehealth (HOSPITAL_COMMUNITY): Payer: Self-pay | Admitting: *Deleted

## 2017-05-09 ENCOUNTER — Inpatient Hospital Stay (HOSPITAL_COMMUNITY): Payer: PPO

## 2017-05-09 LAB — CBC WITH DIFFERENTIAL/PLATELET
BASOS PCT: 0 %
Basophils Absolute: 0 10*3/uL (ref 0.0–0.1)
EOS ABS: 0.2 10*3/uL (ref 0.0–0.7)
EOS PCT: 3 %
HCT: 32.8 % — ABNORMAL LOW (ref 36.0–46.0)
Hemoglobin: 9.9 g/dL — ABNORMAL LOW (ref 12.0–15.0)
Lymphocytes Relative: 18 %
Lymphs Abs: 0.9 10*3/uL (ref 0.7–4.0)
MCH: 28.9 pg (ref 26.0–34.0)
MCHC: 30.2 g/dL (ref 30.0–36.0)
MCV: 95.6 fL (ref 78.0–100.0)
Monocytes Absolute: 0.5 10*3/uL (ref 0.1–1.0)
Monocytes Relative: 10 %
Neutro Abs: 3.4 10*3/uL (ref 1.7–7.7)
Neutrophils Relative %: 69 %
PLATELETS: 182 10*3/uL (ref 150–400)
RBC: 3.43 MIL/uL — AB (ref 3.87–5.11)
RDW: 17.5 % — ABNORMAL HIGH (ref 11.5–15.5)
WBC: 4.9 10*3/uL (ref 4.0–10.5)

## 2017-05-09 LAB — BASIC METABOLIC PANEL
Anion gap: 10 (ref 5–15)
BUN: 58 mg/dL — ABNORMAL HIGH (ref 6–20)
CHLORIDE: 107 mmol/L (ref 101–111)
CO2: 21 mmol/L — ABNORMAL LOW (ref 22–32)
CREATININE: 1.73 mg/dL — AB (ref 0.44–1.00)
Calcium: 10.4 mg/dL — ABNORMAL HIGH (ref 8.9–10.3)
GFR, EST AFRICAN AMERICAN: 32 mL/min — AB (ref >60)
GFR, EST NON AFRICAN AMERICAN: 27 mL/min — AB (ref >60)
Glucose, Bld: 102 mg/dL — ABNORMAL HIGH (ref 65–99)
POTASSIUM: 4.5 mmol/L (ref 3.5–5.1)
SODIUM: 138 mmol/L (ref 135–145)

## 2017-05-09 NOTE — Telephone Encounter (Signed)
-----   Message from [place], RN sent at 04/11/2017  9:46 AM EST ----- Regarding: 2 month d/l  Patient advised to f/u with [facility] for instructions on fixing her cpap. Once she has started her cpap she will get a 2 month D/L.   Thanks  [employer]

## 2017-05-09 NOTE — Progress Notes (Signed)
Occupational Therapy Session Note  Patient Details  Name: Tara Dunn MRN: [id number] Date of Birth: 1940-02-20  Today's Date: 05/09/2017 Individual Therapeutic minutes  - 1188-6773 60 minutes    Skilled Therapeutic Interventions/Progress Updates: Patient focus today:  Patient bathed and dressed she stated, but practiced donning, doffing pants, socks and shoes via Long hand reacher, elastic shoe strings, and sock aide with extra time and minimal, minimal assistance to get right heeel into shoe.  As well, she worked on sit to stand with close S and seated upper extremity exercises and endurance.  Patient worked steadily though heavy breathing and this clinician educated her on the importance of energy conservation and work simplication during self care and any applicable tasks.  Patient was left with safety belt engaged and seated in her w/c with call bell within reach.     Therapy Documentation Precautions:  Precautions Precautions: Fall Precaution Comments: Rt hemi Restrictions Weight Bearing Restrictions: No Pain: Pain Assessment Pain Score: 0-No pain  See Function Navigator for Current Functional Status.   Therapy/Group: Individual Therapy  Alfredia Ferguson [facility] 05/09/2017, 2:33 PM

## 2017-05-09 NOTE — Progress Notes (Signed)
Physical Therapy Session Note  Patient Details  Name: Tara Dunn MRN: [id number] Date of Birth: 01/06/1941  Today's Date: 05/09/2017 PT Individual Time: 0900-1000 and 1500-1530 PT Individual Time Calculation (min): 60 min and 30 min (total 90 min)   Short Term Goals: Week 2:  PT Short Term Goal 1 (Week 2): =LTG due to estimated LOS  Skilled Therapeutic Interventions/Progress Updates: Tx 1: Pt received seated in bed, denies pain and agreeable to treatment. Supine>sit with S, bedrails, increased time to initiate and complete. Dons shirt in sitting with S. Pants donned maxA after pt unsuccessfully attempts to thread LEs. Sit <>stand from bed with close S, cues for foot placement and technique. MinA standing balance while performing hygiene and changing brief after incontinent bladder accident in brief and on floor during brief change. Stand pivot to w/c with RW and close S. Seated in w/c at sink pt performs hygiene with setupA. Gait with RW x180' with min guard/close S, min cues for forward visual gaze for upright posture. Standing balance/gastroc ROM on red wedge while engaged in connect four game. Moderate cues for game play as pt unfamiliar with game. Sit <>stand x5 reps from mat with RW and S. Returned to room totalA at end of session, all needs in reach.   Tx 2: Pt received seated in w/c, denies pain and agreeable to treatment. Gait  x120' with RW and close S, slow speed but improving step length. Gait on ramp and uneven mulch surface with close S and RW; min cues for R foot clearance on mulch d/t increase in foot drag. Sideways walking and backwards walking on ramp for glute med/max strengthening and min guard; cues for maintaining neutral pelvis and reduce substitution of hip flexion for abduction. Remained seated in w/c at end of session, quick release belt and chair alarm intact, all needs in reach.      Therapy Documentation Precautions:  Precautions Precautions: Fall Precaution  Comments: Rt hemi Restrictions Weight Bearing Restrictions: No Pain: Pain Assessment Pain Scale: 0-10 Pain Score: 0-No pain   See Function Navigator for Current Functional Status.   Therapy/Group: Individual Therapy  Luberta Mutter 05/09/2017, 10:23 AM

## 2017-05-09 NOTE — Progress Notes (Signed)
[facility]     PROGRESS NOTE    Subjective/Complaints: Pt seen lying in bed this AM.  She slept well overnight and had a good weekend.  She tells me to turn on the lights.    ROS: Denies CP, SOB, N/V/D  Objective: Vital Signs: Blood pressure (!) 122/59, pulse 88, temperature (!) 97.5 F (36.4 C), temperature source Oral, resp. rate 18, height 5\' 3"  (1.6 m), weight 98.4 kg (216 lb 14.9 oz), SpO2 100 %. No results found. No results for input(s): WBC, HGB, HCT, PLT in the last 72 hours. No results for input(s): NA, K, CL, GLUCOSE, BUN, CREATININE, CALCIUM in the last 72 hours.  Invalid input(s): CO CBG (last 3)  No results for input(s): GLUCAP in the last 72 hours.  Wt Readings from Last 3 Encounters:  05/09/17 98.4 kg (216 lb 14.9 oz)  04/21/17 99 kg (218 lb 4.1 oz)  04/15/17 100.2 kg (221 lb)    Physical Exam:  Constitutional: No distress . Vital signs reviewed. HENT: Normocephalic, atraumatic Eyes: EOMI, No discharge Cardiovascular: RRR. No JVD    Respiratory: CTA Bilaterally. Normal effort    GI: BS +, non-distended  Musc: No edema or tenderness in extremities Neurological: Alertand oriented  Right central 7 with mild dysarthria Follows basic commands.   Motor: B/l UE grossly 4-4+/5 prox to distal (left stronger than right) RLE: 4-/5 prox to distal  LLE: 4/5 prox to distal  Skin. Warm and dry Psych: Pleasant and cooperative  Assessment/Plan: 1.  Functional and mobility deficits secondary to left posterior limb internal capsule infarct which require 3+ hours per day of interdisciplinary therapy in a comprehensive inpatient rehab setting. Physiatrist is providing close team supervision and 24 hour management of active medical problems listed below. Physiatrist and rehab team continue to assess barriers to discharge/monitor patient progress toward functional and medical goals.  Function:  Bathing Bathing position   Position:  Shower  Bathing parts Body parts bathed by patient: Right arm, Left arm, Chest, Abdomen, Front perineal area, Buttocks, Right upper leg, Left upper leg, Right lower leg, Left lower leg Body parts bathed by helper: Back  Bathing assist Assist Level: Touching or steadying assistance(Pt > 75%)      Upper Body Dressing/Undressing Upper body dressing   What is the patient wearing?: Pull over shirt/dress     Pull over shirt/dress - Perfomed by patient: Thread/unthread right sleeve, Thread/unthread left sleeve, Put head through opening, Pull shirt over trunk Pull over shirt/dress - Perfomed by helper: Pull shirt over trunk Button up shirt - Perfomed by patient: Thread/unthread right sleeve, Thread/unthread left sleeve, Pull shirt around back Button up shirt - Perfomed by helper: Button/unbutton shirt    Upper body assist Assist Level: Supervision or verbal cues      Lower Body Dressing/Undressing Lower body dressing   What is the patient wearing?: Pants, Socks, Shoes, Underwear Underwear - Performed by patient: Thread/unthread right underwear leg, Thread/unthread left underwear leg, Pull underwear up/down Underwear - Performed by helper: Thread/unthread right underwear leg, Pull underwear up/down Pants- Performed by patient: Thread/unthread right pants leg, Thread/unthread left pants leg, Pull pants up/down Pants- Performed by helper: Pull pants up/down     Socks - Performed by patient: Don/doff right sock, Don/doff left sock Socks - Performed by helper: Don/doff right sock, Don/doff left sock Shoes - Performed by patient: Don/doff left shoe, Don/doff right shoe Shoes - Performed by helper: Don/doff right shoe, Don/doff left shoe  Lower body assist Assist for lower body dressing: Touching or steadying assistance (Pt > 75%)      Toileting Toileting Toileting activity did not occur: No continent bowel/bladder event Toileting steps completed by patient: Performs perineal  hygiene Toileting steps completed by helper: Adjust clothing prior to toileting, Adjust clothing after toileting Toileting Assistive Devices: Grab bar or rail  Toileting assist Assist level: Touching or steadying assistance (Pt.75%)   Transfers Chair/bed transfer   Chair/bed transfer method: Ambulatory Chair/bed transfer assist level: Touching or steadying assistance (Pt > 75%) Chair/bed transfer assistive device: Walker, Air cabin crew     Max distance: 85 ft Assist level: Touching or steadying assistance (Pt > 75%)   Wheelchair   Type: Manual Max wheelchair distance: 50 ft Assist Level: Supervision or verbal cues  Cognition Comprehension Comprehension assist level: Follows basic conversation/direction with no assist  Expression Expression assist level: Expresses basic needs/ideas: With no assist  Social Interaction Social Interaction assist level: Interacts appropriately 90% of the time - Needs monitoring or encouragement for participation or interaction.  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 Plan aspirin and Plavix 3 weeks then Plavix alone (~05/10/17)   Cont CIR 2. DVT Prophylaxis/Anticoagulation: started heparin, d/ced SCD. Monitor for any signs of DVT 3. Pain Management:Neurontin 300 mg 3 times a day, Tylenol as needed 4. Mood:Provide emotional support 5. Neuropsych: This patientiscapable of making decisions on herown behalf. 6. Skin/Wound Care:Routine skin checks 7. Fluids/Electrolytes/Nutrition:Routine I&O's  8.Chronic combined systolic and diastolic congestive heart failure.   EJ FX 45-50% with Gr 1 DD Filed Weights    05/07/17 0414 05/08/17 0539 05/09/17 0553  Weight: 99.9 kg (220 lb 3.2 oz) 99.1 kg (218 lb 7.6 oz) 98.4 kg (216 lb 14.9 oz)    Lasix d/ced 9.Cardiomyopathy with AICD. Follow-up cardiology services    Weights stable on 4/1 10.Hypertension. Coreg 25 mg twice a day, BIDIL20-37.32milligrams twice a day Vitals:   05/09/17 0553 05/09/17 0844  BP: (!) 123/54 (!) 122/59  Pulse: 85 88  Resp: 18 18  Temp: 98.6 F (37 C) (!) 97.5 F (36.4 C)  SpO2: 97% 100%   Relatively controlled on 4/1 11.Chronic right lower extremity edema.    12. CKDstage III. Baseline creatinine 1.69.   Cr 1.80 on 3/25   Encourage fluids  Labs pending 13.Hyperlipidemia. Lipitor 14.GERD. Protonix 15. ABLA   Hb 10.2 on 3/25  Labs pending   Cont to monitor 16. Hypoalbuminemia   Supplement initiated on 3/20  LOS (Days) 17 A FACE TO FACE EVALUATION WAS PERFORMED  Anai Lipson Lorie Phenix, MD 05/09/2017 9:07 AM

## 2017-05-09 NOTE — Telephone Encounter (Addendum)
Tara Dunn (ext [phone]) will contact our office when patients machine is fixed and will send [place] the 2 month download. Could not contact patient memory was full on voice mail.

## 2017-05-09 NOTE — Progress Notes (Signed)
Speech Language Pathology Daily Session Note  Patient Details  Name: Tara Dunn MRN: [id number] Date of Birth: 1940/03/24  Today's Date: 05/09/2017 SLP Individual Time: 1240-1337 SLP Individual Time Calculation (min): 57 min  Short Term Goals: Week 3: SLP Short Term Goal 1 (Week 3): STG=LTG due to remaining length of stay   Skilled Therapeutic Interventions:Skilled ST services focused on cognitive skills. SLP facilitated basic problem solving and recall with familiar card game, pt required min-mod A verbal cues for problem solving and mod A verbal cues to recall rules utilizing visual aid. SLP facilitated word finding skills in divergent naming task, pt required max-mod A verbal cues in simple categories. SLP communcated with pt and nursing staff about bathroom protocol, which have resulted in positive results per pt/nurse. Pt was left in room with call bell within reach. Recommend to continue skilled ST services.       Function:  Eating Eating   Modified Consistency Diet: No Eating Assist Level: No help, No cues   Eating Set Up Assist For: Opening containers       Cognition Comprehension Comprehension assist level: Follows basic conversation/direction with no assist  Expression   Expression assist level: Expresses basic needs/ideas: With no assist  Social Interaction Social Interaction assist level: Interacts appropriately 90% of the time - Needs monitoring or encouragement for participation or interaction.  Problem Solving Problem solving assist level: Solves basic 75 - 89% of the time/requires cueing 10 - 24% of the time  Memory Memory assist level: Recognizes or recalls 75 - 89% of the time/requires cueing 10 - 24% of the time;Recognizes or recalls 50 - 74% of the time/requires cueing 25 - 49% of the time    Pain Pain Assessment Pain Score: 0-No pain  Therapy/Group: Individual Therapy  Delissa Silba  [facility] 05/09/2017, 1:44 PM

## 2017-05-10 ENCOUNTER — Inpatient Hospital Stay (HOSPITAL_COMMUNITY): Payer: PPO | Admitting: Occupational Therapy

## 2017-05-10 ENCOUNTER — Inpatient Hospital Stay (HOSPITAL_COMMUNITY): Payer: PPO | Admitting: Physical Therapy

## 2017-05-10 ENCOUNTER — Inpatient Hospital Stay (HOSPITAL_COMMUNITY): Payer: PPO | Admitting: Speech Pathology

## 2017-05-10 NOTE — Progress Notes (Signed)
Patient refused CPAP for tonight.  Machine pulled from room per patient request.

## 2017-05-10 NOTE — Progress Notes (Addendum)
Occupational Therapy Discharge Summary  Patient Details  Name: Tara Dunn MRN: [id number] Date of Birth: 1940-03-24  Today's Date: 05/10/2017 OT Individual Time: 0300-9233 OT Individual Time Calculation (min): 54 min    Session Note:  Pt completed toilet transfer, toileting, bathing, and dressing during session.  Supervision for all transfers to the toilet and the walk-in shower with use of the RW.  She utilized the 3:1 over the toilet and the shower bench for safety in the shower.  She completed all bathing with supervision sit to stand, using the grab bars for support.  Dressing sit to stand at the sink from the wheelchair with min assist.  She needed assist with donning the right shoe and pulling pants over her hips.  Feel with a slightly larger sized pants she would be able to complete this with supervision.  Pt left in wheelchair with safety belt in place and call button and phone in reach.    Patient has met 10 of 11 long term goals due to improved balance, postural control, ability to compensate for deficits, functional use of  RIGHT upper and RIGHT lower extremity, improved awareness and improved coordination.  Patient to discharge at overall Supervision level.  Patient's care partner is independent to provide the necessary physical and cognitive assistance at discharge.    Reasons goals not met: Pt needs min assist for LB dressing tasks  Recommendation:  Patient will benefit from ongoing skilled OT services in home health setting to continue to advance functional skills in the area of BADL, iADL and Reduce care partner burden.  Feel pt will benefit from follow-up [facility] for further work on selfcare independence from her current supervision/min assist level.  She continues to demonstrate RLE hermiparesis and decreased dynamic standing balance for ADL tasks.    Equipment: No equipment provided  Reasons for discharge: treatment goals met and discharge from hospital  Patient/family  agrees with progress made and goals achieved: Yes  OT Discharge Precautions/Restrictions  Precautions Precautions: Fall Precaution Comments: Rt hemi Restrictions Weight Bearing Restrictions: No  Pain Pain Assessment Pain Scale: 0-10 Pain Score: 0-No pain ADL ADL ADL Comments: Please see functional navigator for ADL status Vision Baseline Vision/History: Wears glasses Wears Glasses: At all times Patient Visual Report: No change from baseline Vision Assessment?: Yes Eye Alignment: Within Functional Limits Ocular Range of Motion: Within Functional Limits Alignment/Gaze Preference: Head tilt(Head tilt to the right noted) Tracking/Visual Pursuits: Decreased smoothness of horizontal tracking;Decreased smoothness of vertical tracking Visual Fields: No apparent deficits Additional Comments: Pt with jerky tracking in all directions, and at times would lose attention to target.   Perception  Perception: Within Functional Limits Praxis Praxis: Intact Cognition Overall Cognitive Status: Impaired/Different from baseline Arousal/Alertness: Awake/alert Orientation Level: Oriented X4 Attention: Sustained Sustained Attention: Appears intact Selective Attention: Appears intact Memory: Impaired Memory Impairment: Decreased recall of new information;Retrieval deficit Problem Solving: Impaired Problem Solving Impairment: Functional complex;Functional basic Behaviors: Impulsive Safety/Judgment: Appears intact Comments: Pt still demonstrates decreased carryover of sit to stand techniques from session to session, requiring mod to max instructional cueing to sequence.  Sensation Sensation Light Touch: Appears Intact Stereognosis: Appears Intact Hot/Cold: Appears Intact Proprioception: Appears Intact Coordination Gross Motor Movements are Fluid and Coordinated: No Fine Motor Movements are Fluid and Coordinated: No Coordination and Movement Description: Pt demonstrates [place] coordination  with BUEs but still impaired with coordination and strength in the RLE.  Motor  Motor Motor: Hemiplegia Motor - Discharge Observations: R hemiparesis in the RLE Mobility  Bed  Mobility Bed Mobility: Supine to Sit;Sit to Supine Supine to Sit: 5: Supervision;HOB flat Supine to Sit Details: Verbal cues for technique Sit to Supine: 4: Min assist;HOB flat Sit to Supine - Details: Verbal cues for technique;Tactile cues for placement Sit to Supine - Details (indicate cue type and reason): assist for RLE onto bed Transfers Sit to Stand: 5: Supervision;With upper extremity assist Sit to Stand Details: Verbal cues for precautions/safety Stand to Sit: 5: Supervision;With upper extremity assist Stand to Sit Details (indicate cue type and reason): Verbal cues for technique;Verbal cues for precautions/safety  Trunk/Postural Assessment  Cervical Assessment Cervical Assessment: Exceptions to WFL(cervical protraction) Thoracic Assessment Thoracic Assessment: Within Functional Limits Lumbar Assessment Lumbar Assessment: Within Functional Limits Postural Control Postural Control: Deficits on evaluation(delayed/inefficient stepping/righting reactions)  Balance Balance Balance Assessed: Yes Static Sitting Balance Static Sitting - Balance Support: Feet supported Static Sitting - Level of Assistance: 6: Modified independent (Device/Increase time) Dynamic Sitting Balance Dynamic Sitting - Balance Support: During functional activity Dynamic Sitting - Level of Assistance: 5: Stand by assistance Static Standing Balance Static Standing - Balance Support: During functional activity;Bilateral upper extremity supported Static Standing - Level of Assistance: 5: Stand by assistance Dynamic Standing Balance Dynamic Standing - Balance Support: During functional activity Dynamic Standing - Level of Assistance: 5: Stand by assistance Dynamic Standing - Balance Activities: Forward lean/weight shifting;Reaching  for objects Extremity/Trunk Assessment RUE Assessment RUE Assessment: Within Functional Limits(strength 4-/5) LUE Assessment LUE Assessment: Within Functional Limits(strength overall 4-/5)   See Function Navigator for Current Functional Status.  Mackensie Pilson OTR/L 05/10/2017, 12:45 PM

## 2017-05-10 NOTE — Progress Notes (Signed)
Physical Therapy Discharge Summary  Patient Details  Name: Tara Dunn MRN: [id number] Date of Birth: [birth year]  Today's Date: 05/10/2017 PT Individual Time: 1000-1100 PT Individual Time Calculation (min): 60 min    Patient has met 8 of 9 long term goals due to improved activity tolerance, improved balance, improved postural control, increased strength, ability to compensate for deficits, functional use of  right upper extremity and right lower extremity, improved attention, improved awareness and improved coordination.  Patient to discharge at an ambulatory level Supervision.   Patient's care partner is independent to provide the necessary physical and cognitive assistance at discharge.  Reasons goals not met: Requires minA for sit >supine for LE management.   Recommendation:  Patient will benefit from ongoing skilled PT services in home health setting to continue to advance safe functional mobility, address ongoing impairments in balance, strength, ROM, activity tolerance, and minimize fall risk.  Equipment: W/c  Reasons for discharge: treatment goals met and discharge from hospital  Patient/family agrees with progress made and goals achieved: Yes  PT Discharge Precautions/Restrictions Precautions Precautions: Fall Precaution Comments: Rt hemi Restrictions Weight Bearing Restrictions: No Pain Pain Assessment Pain Scale: 0-10 Pain Score: 0-No pain Vision/Perception  Perception Perception: Within Functional Limits Praxis Praxis: Intact  Cognition Overall Cognitive Status: Impaired/Different from baseline Arousal/Alertness: Awake/alert Orientation Level: Oriented X4 Attention: Sustained Sustained Attention: Appears intact Selective Attention: Appears intact Memory: Impaired Memory Impairment: Decreased recall of new information;Retrieval deficit Problem Solving: Impaired Problem Solving Impairment: Functional complex;Functional basic Behaviors:  Impulsive Safety/Judgment: Appears intact Comments: Pt still demonstrates decreased carryover of sit to stand techniques from session to session, requiring mod to max instructional cueing to sequence.   Sensation Sensation Light Touch: Appears Intact Stereognosis: Not tested Hot/Cold: Not tested Proprioception: Appears Intact Coordination Coordination and Movement Description: coordination/strength deficits bilaterally d/t hx of multiple CVA. new mild R hemiparesis, rigidity throughout LEs, trunk Motor  Motor Motor - Discharge Observations: R hemiparesis  Mobility Bed Mobility Bed Mobility: Supine to Sit;Sit to Supine Supine to Sit: 5: Supervision;HOB flat Supine to Sit Details: Verbal cues for technique Sit to Supine: 4: Min assist;HOB flat Sit to Supine - Details: Verbal cues for technique;Tactile cues for placement Sit to Supine - Details (indicate cue type and reason): assist for RLE onto bed Transfers Transfers: Yes Sit to Stand: 5: Supervision;With upper extremity assist Sit to Stand Details: Verbal cues for precautions/safety Stand to Sit: 5: Supervision;With upper extremity assist Stand to Sit Details (indicate cue type and reason): Verbal cues for technique;Verbal cues for precautions/safety Stand Pivot Transfers: 5: Supervision;With armrests Stand Pivot Transfer Details: Verbal cues for technique;Verbal cues for safe use of DME/AE Stand Pivot Transfer Details (indicate cue type and reason): with RW Locomotion  Ambulation Ambulation: Yes Ambulation/Gait Assistance: 5: Supervision Ambulation Distance (Feet): 180 Feet Assistive device: Rolling walker Ambulation/Gait Assistance Details: Verbal cues for precautions/safety Gait Gait: Yes Gait Pattern: Impaired Gait Pattern: Poor foot clearance - left;Poor foot clearance - right;Left foot flat;Right foot flat;Decreased stride length;Wide base of support Gait velocity: significantly decreased for age/gender norms Stairs /  Additional Locomotion Stairs: Yes Stairs Assistance: 5: Supervision Stair Management Technique: Two rails;Alternating pattern;Forwards;Step to pattern(variable step-to and reciprocal pattern) Number of Stairs: 8(8 3-inch steps, 4 6-inch steps) Height of Stairs: 3 Ramp: 5: Supervision Curb: 5: Psychiatric nurse: Yes Wheelchair Assistance: 5: Investment banker, operational Details: Verbal cues for Marketing executive: Both upper extremities Wheelchair Parts Management: Needs assistance Distance: 150'  Trunk/Postural Assessment  Cervical Assessment  Cervical Assessment: Exceptions to WFL(mild forward head) Thoracic Assessment Thoracic Assessment: Within Functional Limits Lumbar Assessment Lumbar Assessment: Within Functional Limits Postural Control Postural Control: Deficits on evaluation(delayed/inefficient stepping/righting reactions)  Balance Balance Balance Assessed: Yes Static Sitting Balance Static Sitting - Balance Support: Feet supported Static Sitting - Level of Assistance: 6: Modified independent (Device/Increase time) Dynamic Sitting Balance Dynamic Sitting - Level of Assistance: 5: Stand by assistance Static Standing Balance Static Standing - Balance Support: Bilateral upper extremity supported Static Standing - Level of Assistance: 5: Stand by assistance Dynamic Standing Balance Dynamic Standing - Balance Support: Bilateral upper extremity supported Dynamic Standing - Level of Assistance: 5: Stand by assistance Dynamic Standing - Balance Activities: Forward lean/weight shifting;Reaching for objects Extremity Assessment  RUE Assessment RUE Assessment: Within Functional Limits(strength 4-/5) LUE Assessment LUE Assessment: Within Functional Limits(strength overall 4-/5) RLE Assessment RLE Assessment: (hip flexion 3-/5, knee flexion 4-/5, knee extension 4+/5, ankle dorsiflexion/plantarflexion 4+/5 ) LLE  Assessment LLE Assessment: Within Functional Limits(grossly 4+/5 throughout)   Skilled Therapeutic Intervention: Pt received seated in w/c, denies pain and agreeable to treatment. Assessed all mobility as described above with S overall using RW, requires minA for sit >supine to manage RLE. Performed one curb step with RW to simulate home environment. Performed nustep x8 min level 4 with average 45 steps/min for aerobic endurance and strengthening. Pt with no further questions or concerns regarding d/c home with daughter at this time. Remained seated in w/c, chair alarm intact and all needs in reach.    See Function Navigator for Current Functional Status.  Benjiman Core Tygielski 05/10/2017, 10:48 AM

## 2017-05-10 NOTE — Progress Notes (Signed)
[facility] PHYSICAL MEDICINE & REHABILITATION     PROGRESS NOTE    Subjective/Complaints: Pt seen sitting up in bed this AM.  She says, "Turn on that light".  She is looking forward to d/c tomorrow.   ROS: Denies CP, SOB, N/V/D  Objective: Vital Signs: Blood pressure (!) 128/56, pulse 82, temperature 98 F (36.7 C), temperature source Oral, resp. rate 18, height 5\' 3"  (1.6 m), weight 99 kg (218 lb 4.1 oz), SpO2 97 %. No results found. Recent Labs    05/09/17 0924  WBC 4.9  HGB 9.9*  HCT 32.8*  PLT 182   Recent Labs    05/09/17 0924  NA 138  K 4.5  CL 107  GLUCOSE 102*  BUN 58*  CREATININE 1.73*  CALCIUM 10.4*   CBG (last 3)  No results for input(s): GLUCAP in the last 72 hours.  Wt Readings from Last 3 Encounters:  05/10/17 99 kg (218 lb 4.1 oz)  04/21/17 99 kg (218 lb 4.1 oz)  04/15/17 100.2 kg (221 lb)    Physical Exam:  Constitutional: No distress . Vital signs reviewed. HENT: Normocephalic, atraumatic Eyes: EOMI, No discharge Cardiovascular: RRR. No JVD    Respiratory: CTA Bilaterally. Normal effort    GI: BS +, non-distended  Musc: No edema or tenderness in extremities Neurological: Alertand oriented  Right central 7 with mild dysarthria Follows basic commands.   Motor: B/l UE grossly 4-4+/5 prox to distal (left stronger than right) RLE: 4-/5 prox to distal (stable) LLE: 4/5 prox to distal (stable) Skin. Warm and dry Psych: Pleasant and cooperative  Assessment/Plan: 1.  Functional and mobility deficits secondary to left posterior limb internal capsule infarct which require 3+ hours per day of interdisciplinary therapy in a comprehensive inpatient rehab setting. Physiatrist is providing close team supervision and 24 hour management of active medical problems listed below. Physiatrist and rehab team continue to assess barriers to discharge/monitor patient progress toward functional and medical goals.  Function:  Bathing Bathing position    Position: Shower  Bathing parts Body parts bathed by patient: Right arm, Left arm, Chest, Abdomen, Front perineal area, Buttocks, Right upper leg, Left upper leg, Right lower leg, Left lower leg Body parts bathed by helper: Back  Bathing assist Assist Level: Touching or steadying assistance(Pt > 75%)      Upper Body Dressing/Undressing Upper body dressing   What is the patient wearing?: Pull over shirt/dress     Pull over shirt/dress - Perfomed by patient: Thread/unthread right sleeve, Thread/unthread left sleeve, Put head through opening, Pull shirt over trunk Pull over shirt/dress - Perfomed by helper: Pull shirt over trunk Button up shirt - Perfomed by patient: Thread/unthread right sleeve, Thread/unthread left sleeve, Pull shirt around back Button up shirt - Perfomed by helper: Button/unbutton shirt    Upper body assist Assist Level: Supervision or verbal cues      Lower Body Dressing/Undressing Lower body dressing   What is the patient wearing?: Pants, Socks, Shoes Underwear - Performed by patient: Thread/unthread right underwear leg, Thread/unthread left underwear leg, Pull underwear up/down Underwear - Performed by helper: Thread/unthread right underwear leg, Pull underwear up/down Pants- Performed by patient: Thread/unthread right pants leg, Thread/unthread left pants leg, Pull pants up/down Pants- Performed by helper: Thread/unthread right pants leg, Thread/unthread left pants leg, Pull pants up/down     Socks - Performed by patient: Don/doff right sock, Don/doff left sock Socks - Performed by helper: Don/doff right sock, Don/doff left sock Shoes - Performed by patient:  Don/doff left shoe, Don/doff right shoe Shoes - Performed by helper: Don/doff right shoe, Don/doff left shoe          Lower body assist Assist for lower body dressing: Touching or steadying assistance (Pt > 75%)      Toileting Toileting Toileting activity did not occur: No continent bowel/bladder  event Toileting steps completed by patient: Performs perineal hygiene Toileting steps completed by helper: Adjust clothing prior to toileting, Performs perineal hygiene, Adjust clothing after toileting Toileting Assistive Devices: Grab bar or rail  Toileting assist Assist level: Touching or steadying assistance (Pt.75%)   Transfers Chair/bed transfer   Chair/bed transfer method: Stand pivot Chair/bed transfer assist level: Supervision or verbal cues Chair/bed transfer assistive device: Walker, Air cabin crew     Max distance: 180' Assist level: Touching or steadying assistance (Pt > 75%)   Wheelchair   Type: Manual Max wheelchair distance: 50 ft Assist Level: Supervision or verbal cues  Cognition Comprehension Comprehension assist level: Follows basic conversation/direction with no assist  Expression Expression assist level: Expresses basic needs/ideas: With no assist  Social Interaction Social Interaction assist level: Interacts appropriately 90% of the time - Needs monitoring or encouragement for participation or interaction.  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 Plan aspirin and Plavix 3 weeks then Plavix alone, d/ced ASA on 4/2   Cont CIR, plan for d/c tomorrow 2. DVT Prophylaxis/Anticoagulation: started heparin, d/ced SCD. Monitor for any signs of DVT 3. Pain Management:Neurontin 300 mg 3 times a day, Tylenol as needed 4. Mood:Provide emotional support 5. Neuropsych: This patientiscapable of making decisions on herown behalf. 6. Skin/Wound Care:Routine skin checks 7. Fluids/Electrolytes/Nutrition:Routine I&O's  8.Chronic  combined systolic and diastolic congestive heart failure.   EJ FX 45-50% with Gr 1 DD Filed Weights   05/08/17 0539 05/09/17 0553 05/10/17 8366  Weight: 99.1 kg (218 lb 7.6 oz) 98.4 kg (216 lb 14.9 oz) 99 kg (218 lb 4.1 oz)    Lasix d/ced 9.Cardiomyopathy with AICD. Follow-up cardiology services    Weights stable on 4/2 10.Hypertension. Coreg 25 mg twice a day, BIDIL20-37.36milligrams twice a day Vitals:   05/09/17 1510 05/10/17 0632  BP: 136/71 (!) 128/56  Pulse: 80 82  Resp: 20 18  Temp: 97.7 F (36.5 C) 98 F (36.7 C)  SpO2: 100% 97%   Relatively controlled on 4/2 11.Chronic right lower extremity edema.    12. CKDstage III. Baseline creatinine 1.69.   Cr 1.73 on 4/1   Encourage fluids 13.Hyperlipidemia. Lipitor 14.GERD. Protonix 15. ABLA   Hb 9.9 on 4/1   Will need follow up labs as outpt   Cont to monitor 16. Hypoalbuminemia   Supplement initiated on 3/20  LOS (Days) 18 A FACE TO FACE EVALUATION WAS PERFORMED  Jelicia Nantz Lorie Phenix, MD 05/10/2017 8:18 AM

## 2017-05-10 NOTE — Progress Notes (Addendum)
Social Work  Discharge Note  The overall goal for the admission was met for:   Discharge location: Yes-HOME TO DAUGHTER'S HOME WHERE SHE WILL HAVE 24 HOUR SUPERVISION.  Length of Stay: Yes-19 DAYS  Discharge activity level: Yes-SUPERVISION-MIN ASSIST LEVEL  Home/community participation: Yes  Services provided included: MD, RD, PT, OT, SLP, RN, CM, TR, Pharmacy and SW  Financial Services: Private Insurance: [employer]  Follow-up services arranged: Home Health: [facility]-PT,OT,RN, DME: [place] and Patient/Family request agency HH: ACTIVE PT WITH [facility], DME: [facility] RETURNING [place].  Comments (or additional information):DAUGHTER WAS IN FOR FAMILY TRAINING AND AWARE PT WILL REQUIRE 24 HR SUPERVISION AT DISCHARGE.  Patient/Family verbalized understanding of follow-up arrangements: Yes  Individual responsible for coordination of the follow-up plan: [name]-DAUGHTER  Confirmed correct DME delivered: Elease Hashimoto 05/10/2017    Elease Hashimoto

## 2017-05-10 NOTE — Progress Notes (Signed)
Social Work Patient ID: Tara Dunn, female   DOB: 08-Jul-1940, 77 y.o.   MRN: [id number]  Met with pt ready to go home tomorrow. Equipment ordered and home health resumed. Daughter was in last week to attend therapies with pt, aware will need 24 hr supervision. Set for discharge tomorrow.

## 2017-05-10 NOTE — Progress Notes (Signed)
Pt is currently in rehab at [facility] 4th floor.  I went to check on her and her progress. Pt stated that she feels a lot better and she is doing physical therapy twice daily.  She stated that she should be going home on 05/11/17.  I advised pt that I will f/u with her next week.  She will be going back to her daughter's house in [place] after she's discharged.

## 2017-05-10 NOTE — Discharge Summary (Signed)
Discharge summary job [id number]

## 2017-05-10 NOTE — Discharge Summary (Signed)
NAME:  Tara Dunn, Tara Dunn             ACCOUNT NO.:  [id number]  MEDICAL RECORD NO.:  [id number]  LOCATION:                                 FACILITY:  PHYSICIAN:  Delice Lesch, MD        DATE OF BIRTH:  Jul 16, 1940  DATE OF ADMISSION:  04/22/2017 DATE OF DISCHARGE:  05/11/2017                              DISCHARGE SUMMARY   DISCHARGE DIAGNOSES: 1. 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 2. Subcutaneous heparin for DVT prophylaxis. 3. Pain management. 4. Chronic combined systolic and diastolic congestive heart failure. 5. Hypertension. 6. Cardiomyopathy with AICD. 7. Chronic right lower extremity edema. 8. Chronic kidney disease stage 3. 9. Hyperlipidemia.  HISTORY OF PRESENT ILLNESS:  This is a 77 year old right-handed female with history of breast cancer with mastectomy, chemotherapy; chronic right lower extremity edema; cardiomyopathy with AICD placement 2017 as well as congestive heart failure and CKD stage 3; CVA x3, with minimal left-sided weakness, maintained on aspirin therapy.  Lives alone, however she will be staying with her daughter on discharge.  Presented April 19, 2017, with right-sided weakness and slurred speech. Creatinine 1.69.  Troponin negative.  CT of the head unremarkable.  MRI MRA showed early subacute lacunar infarction posterior limb of left internal capsule.  No associated hemorrhage.  MRI lumbar spine showed L3- L4, L4-L5, broad-based disk bulge, annular fissure.  The patient did not receive tPA.  AICD was interrogated with no findings.  Echocardiogram with ejection fraction of 93% grade 1 diastolic dysfunction.  Neurology consulted, advised aspirin and Plavix for CVA prophylaxis x3 weeks, then Plavix alone.  Blood pressures were a bit soft and Lasix as well as Delene Loll was held and resumed as tolerated.  Physical and occupational therapy ongoing.  The patient was admitted for a comprehensive  rehab program.  PAST MEDICAL HISTORY:  See discharge diagnoses.  SOCIAL HISTORY:  Had been living alone, but plans to stay with her daughter.  Functional status upon admission to [facility] was +2 safety for sit to stand, moderate assist stand pivot transfers, mod max assist activities of daily living.  PHYSICAL EXAMINATION:  VITAL SIGNS:  Blood pressure 104/58, pulse 81, temperature 97, respirations 18. GENERAL:  This is an alert female, oriented to person, place, and time. Reasonable insight and awareness of deficits.  EOMs intact. NECK:  Supple.  Nontender no JVD. CARDIAC:  Rate controlled. ABDOMEN:  Soft, nontender.  Good bowel sounds. LUNGS:  Clear to auscultation without wheeze.  REHABILITATION HOSPITAL COURSE:  Patient was admitted to Inpatient Rehab Services with therapies initiated on a 3-hour daily basis, consisting of physical therapy, occupational therapy, and rehabilitation nursing as well as speech therapy.  The following issues were addressed during the patient's rehabilitation stay.  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  She had been on subcutaneous heparin for DVT prophylaxis, later discontinued.  No signs of DVT. Chronic combined systolic and diastolic congestive heart  failure exhibiting no signs of fluid overload.  She had remained off her Lasix as blood pressures was a bit soft, she continued on Coreg as well as BiDil.  Creatinine remained stable with CKD stage 3, latest creatinine 1.80, improved to 1.73.  She continued on Lipitor for hyperlipidemia. Her diet had been advanced to a regular consistency.  The patient received weekly collaborative interdisciplinary team conferences to discuss estimated length of stay, family  teaching, any barriers to discharge.  She was ambulating 85 feet rolling walker, minimal assistance.  Standing on a wedge, working on dynamic balance, and hip trunk extensions, perform sit-to-stand without assistive device. Transported to the gymnasium working with energy conservation techniques.  The patient could bathe and dress, practice donning and doffing her pants, socks, and shoes.  Minimal assistance to get her right heel into the shoe.  She was fully able to communicate her needs. Full family teaching was completed and plan discharge to home.  DISCHARGE MEDICATIONS:  Include: 1. Allopurinol 200 mg p.o. daily. 2. Lipitor 80 mg p.o. daily. 3. Coreg 25 mg p.o. b.i.d. 4. Plavix 75 mg p.o. daily. 5. Neurontin 300 mg p.o. t.i.d. 6. BiDil 20-37.5 mg 2 tablets b.i.d. 7. Protonix 40 mg p.o. daily.  DIET:  Regular.  FOLLOWUP:  She would follow up with Dr. Delice Lesch at the Outpatient Rehab Service office as directed; Dr. Erlinda Hong, Neurology Services call for appointment in 6 weeks; [name] medical management.       Lauraine Rinne, P.A.   ______________________________ Delice Lesch, MD   DA/MEDQ  D:  05/10/2017  T:  05/10/2017  Job:  [id number]  cc:   Dr. Candace Cruise, M.D.

## 2017-05-10 NOTE — Progress Notes (Signed)
Occupational Therapy Session Note  Patient Details  Name: Tara Dunn MRN: [id number] Date of Birth: [birth year]  Today's Date: 05/10/2017 OT Individual Time: 1415-1500 OT Individual Time Calculation (min): 45 min    Short Term Goals: Week 2:  OT Short Term Goal 1 (Week 2): Pt will continue working toward supervision level goals for discharge.  Skilled Therapeutic Interventions/Progress Updates:    Pt seen for OT session focusing on functional transfers and standing balance/tolerance.  Pt sitting up in w/c upon arrival, denying pain and agreeable to tx session. In ADL apartment, completed simulated shower stall transfer using tub bench and 3" ledge in simulation of home environment. Pt completed x2 trials with first trial requiring max VCs for sequencing/technique and min A for dynamic balance, improved on second trial to completing with guarding assist, pt able to independently recall technique for transfer in immediate recall.  In therapy gym, pt completed standing activity at high/low table, requiring pt to reach outside BOS, and across midline to move game pieces on slanted check board. Pt tolerated standing ~4-5 minutes before requiring seated rest break.  Pt taken back to room at end of session, left seated in w/c with chair alarm on, QRB donned and all needs in reach.   Therapy Documentation Precautions:  Precautions Precautions: Fall Precaution Comments: Rt hemi Restrictions Weight Bearing Restrictions: No Pain: Pain Assessment Pain Scale: 0-10 Pain Score: 0-No pain ADL: ADL ADL Comments: Please see functional navigator for ADL status  See Function Navigator for Current Functional Status.   Therapy/Group: Individual Therapy  Shoshannah Faubert L 05/10/2017, 11:50 AM

## 2017-05-10 NOTE — Progress Notes (Addendum)
Speech Language Pathology Discharge Summary  Patient Details  Name: Tara Dunn MRN: [id number] Date of Birth: [birth year]  Today's Date: 05/10/2017 SLP Individual [id number]; [id number] SLP Individual Time Calculation (min): 25 min; 40 min   Skilled Therapeutic Interventions:   Session 1: Pt was seen for skilled ST targeting cognitive goals.  SLP administered the Mini Mental State Exam to measure progress from initial evaluation.  Pt scored 23/30 which, when baseline education is taken into account (10th grade), is indicative of mild cognitive dysfunction.  Deficits appear mostly related to memory and executive functioning.  Pt was left in wheelchair with all needs within reach.   Session 2:  Pt was seen for skilled ST targeting speech intelligibility goals.  SLP facilitated the session with a verbal description task targeting carryover for intelligibility strategies.  Pt needed intermittent supervision verbal cues to slow rate and increase vocal intensity to achieve intelligibility in conversations; however, overall pt was mod I for the majority of the task.  Increased cuing was needed to compensate for increased environmental distractions.  Pt was returned to room and left in chair with all needs within reach.       Patient has met 5 of 5 long term goals.  Patient to discharge at [facility] level.  Reasons goals not met:     Clinical Impression/Discharge Summary:   Pt has made xx gains while inpatient and is discharging (from [facility]) having met x out of x short term goals.  Pt is currently x for tasks due to x and is consuming x diet with x assist for use of swallowing precautions.  Pt/family education is complete at this time.  Pt has demonstrated improved x.  Pt is discharging home/SNF with recommendations for ST follow up at next level of care/no further ST needs indicated as pt is at baseline for cognition/communication/swallowing function.    Care Partner:  Caregiver Able  to Provide Assistance: Yes  Type of Caregiver Assistance: Physical;Cognitive  Recommendation:  Home Health SLP;24 hour supervision/assistance;Outpatient SLP  Rationale for SLP Follow Up: Reduce caregiver burden;Maximize cognitive function and independence   Equipment: none recommended by SLP    Reasons for discharge: Discharged from hospital   Patient/Family Agrees with Progress Made and Goals Achieved: Yes   Function:  Eating Eating   Modified Consistency Diet: No Eating Assist Level: No help, No cues   Eating Set Up Assist For: Opening containers       Cognition Comprehension Comprehension assist level: Follows basic conversation/direction with no assist  Expression   Expression assist level: Expresses basic needs/ideas: With no assist  Social Interaction Social Interaction assist level: Interacts appropriately with others with medication or extra time (anti-anxiety, antidepressant).  Problem Solving Problem solving assist level: Solves basic 75 - 89% of the time/requires cueing 10 - 24% of the time  Memory Memory assist level: Recognizes or recalls 75 - 89% of the time/requires cueing 10 - 24% of the time   Emilio Math 05/10/2017, 3:01 PM

## 2017-05-11 ENCOUNTER — Other Ambulatory Visit: Payer: Self-pay | Admitting: Licensed Clinical Social Worker

## 2017-05-11 MED ORDER — OMEPRAZOLE 40 MG PO CPDR: 40.0000 mg | DELAYED_RELEASE_CAPSULE | Freq: Every day | ORAL | 0 refills | Status: DC

## 2017-05-11 MED ORDER — GABAPENTIN 300 MG PO CAPS: 300.0000 mg | ORAL_CAPSULE | Freq: Three times a day (TID) | ORAL | 0 refills | Status: DC

## 2017-05-11 MED ORDER — CLOPIDOGREL BISULFATE 75 MG PO TABS: 75.0000 mg | ORAL_TABLET | Freq: Every day | ORAL | 0 refills | Status: AC

## 2017-05-11 MED ORDER — ATORVASTATIN CALCIUM 80 MG PO TABS: 80.0000 mg | ORAL_TABLET | Freq: Every day | ORAL | 1 refills | Status: DC

## 2017-05-11 MED ORDER — ISOSORB DINITRATE-HYDRALAZINE 20-37.5 MG PO TABS: 2.0000 | ORAL_TABLET | Freq: Two times a day (BID) | ORAL | 11 refills | Status: DC

## 2017-05-11 MED ORDER — ALLOPURINOL 100 MG PO TABS: 200.0000 mg | ORAL_TABLET | Freq: Every day | ORAL | 0 refills | Status: DC

## 2017-05-11 MED ORDER — CARVEDILOL 25 MG PO TABS: 25.0000 mg | ORAL_TABLET | Freq: Two times a day (BID) | ORAL | 0 refills | Status: DC

## 2017-05-11 NOTE — Progress Notes (Signed)
Proceed and continue with home health therapies physical occupational and nursing as per [place] as prior to admission

## 2017-05-11 NOTE — Patient Outreach (Addendum)
[facility]) Care Management  05/11/2017  Aicia Hallas 06/04/1940 [id number]  [facility] CSW was able to confirm that patient successfully discharged from inpatient rehab to her daughter's residence on 05/11/17. [facility] CSW will make referral to [facility] at this time for TOC. [facility] CSW will sign off at this time.   Eula Fried, BSW, MSW, [email or web address] Phone: [phone] Fax: [phone]

## 2017-05-11 NOTE — Progress Notes (Signed)
Patient received discharge instructions from Marlowe Sh[place], PA-C with verbal understanding. Patient discharged to home with family and patient belongings.

## 2017-05-11 NOTE — Progress Notes (Signed)
[place] PHYSICAL MEDICINE & REHABILITATION     PROGRESS NOTE    Subjective/Complaints: Patient seen lying in bed this morning. She states she slept well overnight. She states, "headache,turn on the light." She is ready for discharge today.  ROS: Denies CP, SOB, N/V/D  Objective: Vital Signs: Blood pressure (!) 113/58, pulse 77, temperature 98.3 F (36.8 C), temperature source Oral, resp. rate 18, height 5\' 3"  (1.6 m), weight 98.4 kg (217 lb), SpO2 98 %. No results found. Recent Labs    05/09/17 0924  WBC 4.9  HGB 9.9*  HCT 32.8*  PLT 182   Recent Labs    05/09/17 0924  NA 138  K 4.5  CL 107  GLUCOSE 102*  BUN 58*  CREATININE 1.73*  CALCIUM 10.4*   CBG (last 3)  No results for input(s): GLUCAP in the last 72 hours.  Wt Readings from Last 3 Encounters:  05/11/17 98.4 kg (217 lb)  04/21/17 99 kg (218 lb 4.1 oz)  04/15/17 100.2 kg (221 lb)    Physical Exam:  Constitutional: No distress . Vital signs reviewed. HENT: Normocephalic, atraumatic Eyes: EOMI, No discharge Cardiovascular: RRR. No JVD    Respiratory: CTA Bilaterally. Normal effort    GI: BS +, non-distended  Musc: No edema or tenderness in extremities Neurological: Alertand oriented  Right central 7 with mild dysarthria Follows basic commands.   Motor: B/l UE grossly 4-4+/5 prox to distal (left stronger than right) RLE: 4-/5 prox to distal (unchanged) LLE: 4/5 prox to distal (unchanged) Skin. Warm and dry Psych: Pleasant and cooperative  Assessment/Plan: 1.  Functional and mobility deficits secondary to left posterior limb internal capsule infarct which require 3+ hours per day of interdisciplinary therapy in a comprehensive inpatient rehab setting. Physiatrist is providing close team supervision and 24 hour management of active medical problems listed below. Physiatrist and rehab team continue to assess barriers to discharge/monitor patient progress toward functional and medical  goals.  Function:  Bathing Bathing position   Position: Shower  Bathing parts Body parts bathed by patient: Right arm, Left arm, Chest, Abdomen, Front perineal area, Buttocks, Right upper leg, Left upper leg, Right lower leg, Left lower leg Body parts bathed by helper: Back  Bathing assist Assist Level: Supervision or verbal cues      Upper Body Dressing/Undressing Upper body dressing   What is the patient wearing?: Pull over shirt/dress     Pull over shirt/dress - Perfomed by patient: Thread/unthread right sleeve, Thread/unthread left sleeve, Put head through opening, Pull shirt over trunk Pull over shirt/dress - Perfomed by helper: Pull shirt over trunk Button up shirt - Perfomed by patient: Thread/unthread right sleeve, Thread/unthread left sleeve, Pull shirt around back Button up shirt - Perfomed by helper: Button/unbutton shirt    Upper body assist Assist Level: Supervision or verbal cues      Lower Body Dressing/Undressing Lower body dressing   What is the patient wearing?: Pants, Socks, Shoes Underwear - Performed by patient: Thread/unthread right underwear leg, Thread/unthread left underwear leg, Pull underwear up/down Underwear - Performed by helper: Thread/unthread right underwear leg, Pull underwear up/down Pants- Performed by patient: Thread/unthread right pants leg, Thread/unthread left pants leg Pants- Performed by helper: Pull pants up/down     Socks - Performed by patient: Don/doff right sock, Don/doff left sock Socks - Performed by helper: Don/doff right sock, Don/doff left sock Shoes - Performed by patient: Don/doff left shoe Shoes - Performed by helper: Don/doff right shoe  Lower body assist Assist for lower body dressing: Touching or steadying assistance (Pt > 75%)      Toileting Toileting Toileting activity did not occur: No continent bowel/bladder event Toileting steps completed by patient: Adjust clothing prior to toileting, Performs  perineal hygiene, Adjust clothing after toileting Toileting steps completed by helper: Adjust clothing prior to toileting, Performs perineal hygiene, Adjust clothing after toileting Toileting Assistive Devices: Grab bar or rail  Toileting assist Assist level: Supervision or verbal cues   Transfers Chair/bed transfer   Chair/bed transfer method: Stand pivot Chair/bed transfer assist level: Supervision or verbal cues Chair/bed transfer assistive device: Walker, Air cabin crew     Max distance: 150 Assist level: Supervision or verbal cues   Wheelchair   Type: Manual Max wheelchair distance: 150 Assist Level: Supervision or verbal cues  Cognition Comprehension Comprehension assist level: Follows basic conversation/direction with no assist  Expression Expression assist level: Expresses basic needs/ideas: With no assist  Social Interaction Social Interaction assist level: Interacts appropriately with others with medication or extra time (anti-anxiety, antidepressant).  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 Plan aspirin and Plavix 3 weeks then Plavix alone, d/ced ASA on 4/2   DC today  Will see patient for transitional care management in 1-2 weeks 2. DVT Prophylaxis/Anticoagulation: started heparin, d/ced SCD. Monitor for any signs of DVT 3. Pain Management:Neurontin 300 mg 3 times a day, Tylenol as needed 4. Mood:Provide emotional support 5. Neuropsych: This patientiscapable of making decisions on herown behalf. 6. Skin/Wound Care:Routine skin checks 7. Fluids/Electrolytes/Nutrition:Routine I&O's   8.Chronic combined systolic and diastolic congestive heart failure.   EJ FX 45-50% with Gr 1 DD Filed Weights   05/09/17 0553 05/10/17 5320 05/11/17 0517  Weight: 98.4 kg (216 lb 14.9 oz) 99 kg (218 lb 4.1 oz) 98.4 kg (217 lb)    Lasix d/ced 9.Cardiomyopathy with AICD. Follow-up cardiology services    Weights stable on 4/3 10.Hypertension. Coreg 25 mg twice a day, BIDIL20-37.72milligrams twice a day Vitals:   05/10/17 1500 05/11/17 0517  BP: 111/70 (!) 113/58  Pulse: 71 77  Resp: 18 18  Temp: 97.7 F (36.5 C) 98.3 F (36.8 C)  SpO2: 100% 98%   Relatively controlled on 4/3 11.Chronic right lower extremity edema.    12. CKDstage III. Baseline creatinine 1.69.   Cr 1.73 on 4/1   Encourage fluids 13.Hyperlipidemia. Lipitor 14.GERD. Protonix 15. ABLA   Hb 9.9 on 4/1   Will need follow up labs as outpt   Cont to monitor 16. Hypoalbuminemia   Supplement initiated on 3/20 17. OSA   Noncompliant at times with CPAP  LOS (Days) 19 A FACE TO FACE EVALUATION WAS PERFORMED  Rainn Zupko Lorie Phenix, MD 05/11/2017 8:17 AM

## 2017-05-11 NOTE — Discharge Instructions (Signed)
Inpatient Rehab Discharge Instructions  Tara Dunn Discharge date and time: No discharge date for patient encounter.   Activities/Precautions/ Functional Status: Activity: activity as tolerated Diet: regular diet Wound Care: none needed Functional status:  ___ No restrictions     ___ Walk up steps independently ___ 24/7 supervision/assistance   ___ Walk up steps with assistance ___ Intermittent supervision/assistance  ___ Bathe/dress independently ___ Walk with walker     _x__ Bathe/dress with assistance ___ Walk Independently    ___ Shower independently ___ Walk with assistance    ___ Shower with assistance ___ No alcohol     ___ Return to work/school ________  Special Instructions: No driving    COMMUNITY REFERRALS UPON DISCHARGE:    Home Health:   PT, RN, [place]   Date of last service:05/11/2017   Medical Equipment/Items Ordered:3 IN 1   Agency/Supplier:[facility]   [phone]   GENERAL COMMUNITY RESOURCES FOR PATIENT/FAMILY: Support Groups:[facility] SUPPORT GROUP EVERY SECOND Thursday @ 3:00-4:00 PM ON THE REHAB UNIT QUESTIONS CONTACT [place] [phone]  STROKE/TIA DISCHARGE INSTRUCTIONS SMOKING Cigarette smoking nearly doubles your risk of having a stroke & is the single most alterable risk factor  If you smoke or have smoked in the last 12 months, you are advised to quit smoking for your health.  Most of the excess cardiovascular risk related to smoking disappears within a year of stopping.  Ask you doctor about anti-smoking medications  [facility] Quit Line: 1-800-QUIT NOW  Free Smoking Cessation Classes [phone]  CHOLESTEROL Know your levels; limit fat & cholesterol in your diet  Lipid Panel     Component Value Date/Time   CHOL 102 04/19/2017 0424   TRIG 59 04/19/2017 0424   HDL 48 04/19/2017 0424   CHOLHDL 2.1 04/19/2017 0424   VLDL 12 04/19/2017 0424   LDLCALC 42 04/19/2017 0424      Many patients  benefit from treatment even if their cholesterol is at goal.  Goal: Total Cholesterol (CHOL) less than 160  Goal:  Triglycerides (TRIG) less than 150  Goal:  HDL greater than 40  Goal:  LDL (LDLCALC) less than 100   BLOOD PRESSURE American Stroke Association blood pressure target is less that 120/80 mm/Hg  Your discharge blood pressure is:  BP: (!) 141/77  Monitor your blood pressure  Limit your salt and alcohol intake  Many individuals will require more than one medication for high blood pressure  DIABETES (A1c is a blood sugar average for last 3 months) Goal HGBA1c is under 7% (HBGA1c is blood sugar average for last 3 months)  Diabetes: No known diagnosis of diabetes    Lab Results  Component Value Date   HGBA1C 6.1 (H) 04/19/2017     Your HGBA1c can be lowered with medications, healthy diet, and exercise.  Check your blood sugar as directed by your physician  Call your physician if you experience unexplained or low blood sugars.  PHYSICAL ACTIVITY/REHABILITATION Goal is 30 minutes at least 4 days per week  Activity: Increase activity slowly, Therapies: Physical Therapy: Home Health Return to work:   Activity decreases your risk of heart attack and stroke and makes your heart stronger.  It helps control your weight and blood pressure; helps you relax and can improve your mood.  Participate in a regular exercise program.  Talk with your doctor about the best form of exercise for you (dancing, walking, swimming, cycling).  DIET/WEIGHT Goal is to maintain a healthy weight  Your discharge  diet is: Fall precautions Diet Heart Room service appropriate? Yes; Fluid consistency: Thin  liquids Your height is:  Height: 5\' 3"  (160 cm) Your current weight is: Weight: 99.2 kg (218 lb 11.1 oz) Your Body Mass Index (BMI) is:  BMI (Calculated): 38.75  Following the type of diet specifically designed for you will help prevent another stroke.  Your goal weight range is:    Your goal  Body Mass Index (BMI) is 19-24.  Healthy food habits can help reduce 3 risk factors for stroke:  High cholesterol, hypertension, and excess weight.  RESOURCES Stroke/Support Group:  Call [phone]   STROKE EDUCATION PROVIDED/REVIEWED AND GIVEN TO PATIENT Stroke warning signs and symptoms How to activate emergency medical system (call 911). Medications prescribed at discharge. Need for follow-up after discharge. Personal risk factors for stroke. Pneumonia vaccine given:  Flu vaccine given:  My questions have been answered, the writing is legible, and I understand these instructions.  I will adhere to these goals & educational materials that have been provided to me after my discharge from the hospital.      My questions have been answered and I understand these instructions. I will adhere to these goals and the provided educational materials after my discharge from the hospital.  Patient/Caregiver Signature _______________________________ Date __________  Clinician Signature _______________________________________ Date __________  Please bring this form and your medication list with you to all your follow-up doctor's appointments.

## 2017-05-12 ENCOUNTER — Other Ambulatory Visit: Payer: Self-pay | Admitting: Physical Medicine & Rehabilitation

## 2017-05-12 ENCOUNTER — Other Ambulatory Visit: Payer: Self-pay | Admitting: *Deleted

## 2017-05-12 ENCOUNTER — Other Ambulatory Visit (HOSPITAL_COMMUNITY): Payer: Self-pay

## 2017-05-12 ENCOUNTER — Telehealth (HOSPITAL_COMMUNITY): Payer: Self-pay | Admitting: *Deleted

## 2017-05-12 NOTE — Patient Outreach (Addendum)
[facility]) Care Management  05/12/2017  Renny Sloma 17-Oct-1940 [id number]  Transition of care (successful)  RN spoke with pt daughter Dickie La) who indicated pt is doing well and will have [facility] and para-medicine involved. RN introduced the [facility] services and purpose for today's call. Verified identifiers and further engaged by completing the transition of care template and offered to assist with additional community resources. Daughter states the para-medicine visited on yesterday and [facility] will be visiting tomorrow. Reports pt's follow up appointment with her primary provider is 4/8. Daughter states she has some confusion and able to talk some but will be recovery at her home for the next 30 days before returning home.  Explained the [facility] services once again and inquired on assistance needed. Daughter states she feels pt is doing well but again not sure what to do if an emergency. RN stressed if pt is in acute medical needs to call 911 however if issues occur that are not an emergency to contact pt's provider for an office visit. Also discuss Urgent care if pt need medical assistance but not an emergency. [facility] will be visiting several days weekly along with Para-medicine. RN offered to continue to follow up weekly and discussed a plan of care related to hospital prevention, adherence to medical appointments and medications. Daughter has agreed and was receptive to this plan of care. After all information was verified and daughter has an understanding of [facility] service based upon pt's enrollment offered to follow up week and scheduled a call for next week. Will further engage and attempt to gather more of the initial assessment on that call. No other inquires or request at this time.   Patient was recently discharged from hospital and all medications have been reviewed.  [name], RN Care Management Coordinator [place] Office  [phone]

## 2017-05-12 NOTE — Telephone Encounter (Signed)
During last hospital admission patient was taken off lasix. Per Jonni Sanger ok to stay off Lasix until hosp f/u 05/18/17. Karena Addison has removed Lasix from patients medicine box until we give new orders.

## 2017-05-12 NOTE — Progress Notes (Signed)
Paramedicine Encounter    Patient ID: Tara Dunn, female    DOB: 03/30/1940, 77 y.o.   MRN: [id number]    Patient Care Team: Tara Bien, MD as PCP - General (Family Medicine) Tara Bastos, RN as [place] Management  Patient Active Problem List   Diagnosis Date Noted  . Labile blood pressure   . Hypoalbuminemia due to protein-calorie malnutrition ([place])   . Acute blood loss anemia   . AKI (acute kidney injury) ([place])   . Peripheral edema   . Stage 3 chronic kidney disease ([place])   . Benign essential HTN   . History of CVA with residual deficit   . Cerebrovascular accident (CVA) due to thrombosis of left middle cerebral artery ([place])   . Right leg weakness 04/19/2017  . Right hemiparesis ([place]) 04/19/2017  . 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 Joint pain 12/09/2015  . ICD (implantable cardioverter-defibrillator) in place 03/26/2015  . Syncope 03/06/2015  . Obesity (BMI 30-39.9) 01/21/2015  . Excessive daytime sleepiness 08/04/2014  . OSA (obstructive sleep apnea) 08/04/2014  . Essential hypertension 06/13/2014  . Chronic systolic CHF (congestive heart failure) ([place]) 05/20/2014  . Congestive heart disease ([place])   . Chronic combined systolic and diastolic CHF (congestive heart failure) ([place]) 05/03/2014  . CKD (chronic kidney disease), stage III ([place]) 05/03/2014  . Hx of stroke without residual deficits 05/03/2014    Current Outpatient Medications:  .  allopurinol (ZYLOPRIM) 100 MG tablet, Take 2 tablets (200 mg total) by mouth daily., Disp: 30 tablet, Rfl: 0 .  atorvastatin (LIPITOR) 80 MG tablet, Take 1 tablet (80 mg total) by mouth daily at 6 PM., Disp: 30 tablet, Rfl: 1 .  carvedilol (COREG) 25 MG tablet, Take 1 tablet (25 mg total) by mouth 2 (two) times daily., Disp: 60 tablet, Rfl: 0 .  clopidogrel (PLAVIX) 75 MG tablet, Take 1 tablet (75 mg total) by mouth daily., Disp: 30 tablet, Rfl: 0 .  gabapentin (NEURONTIN) 300 MG capsule,  Take 1 capsule (300 mg total) by mouth 3 (three) times daily., Disp: 90 capsule, Rfl: 0 .  isosorbide-hydrALAZINE (BIDIL) 20-37.5 MG tablet, Take 2 tablets by mouth 2 (two) times daily., Disp: 120 tablet, Rfl: 11 .  omeprazole (PRILOSEC) 40 MG capsule, Take 1 capsule (40 mg total) by mouth daily., Disp: 30 capsule, Rfl: 0 .  senna-docusate (SENOKOT-S) 8.6-50 MG tablet, Take 1 tablet by mouth at bedtime as needed for mild constipation., Disp: , Rfl:  Allergies  Allergen Reactions  . Shellfish Allergy Hives     Social History   Socioeconomic History  . Marital status: Widowed    Spouse name: Not on file  . Number of children: Not on file  . Years of education: Not on file  . Highest education level: Not on file  Occupational History  . Not on file  Social Needs  . Financial resource strain: Not on file  . Food insecurity:    Worry: Not on file    Inability: Not on file  . Transportation needs:    Medical: Not on file    Non-medical: Not on file  Tobacco Use  . Smoking status: Former Smoker    Packs/day: 0.50    Years: 40.00    Pack years: 20.00    Types: Cigarettes  . Smokeless tobacco: Never Used  . Tobacco comment: Quit in 2009.  Substance and Sexual Activity  . Alcohol use: No    Alcohol/week: 0.0 oz  Comment: "drank occasionally when I was young; last drink was in the late 1990s"  . Drug use: No  . Sexual activity: Never  Lifestyle  . Physical activity:    Days per week: Not on file    Minutes per session: Not on file  . Stress: Not on file  Relationships  . Social connections:    Talks on phone: Not on file    Gets together: Not on file    Attends religious service: Not on file    Active member of club or organization: Not on file    Attends meetings of clubs or organizations: Not on file    Relationship status: Not on file  . Intimate partner violence:    Fear of current or ex partner: Not on file    Emotionally abused: Not on file    Physically abused:  Not on file    Forced sexual activity: Not on file  Other Topics Concern  . Not on file  Social History Narrative   Lives alone in an independent living facility.  Education: 10th grade.     Retired from [employer] work.  Moved here from [place] in 2015.    Physical Exam  Pulmonary/Chest: Breath sounds normal. No respiratory distress.  Abdominal: She exhibits no distension. There is no tenderness.  Musculoskeletal: She exhibits edema.  Right lower leg edema (hx of same)  Skin: Skin is warm and dry. She is not diaphoretic.        Future Appointments  Date Time Provider [place]  05/18/2017  7:25 AM [facility] DEVICE REMOTES [facility]  05/18/2017 11:30 AM [facility] PA/NP [facility] None  05/19/2017  1:00 PM Tara Hugger, NP [facility] CPR  06/21/2017  3:45 PM Tara Poisson, NP [facility] None    ATF [name]  sitting in a straight back chair in the living room of her daughters house.  Pt just was discharged yesterday and plans to stay with her daughter for at least the next 30 days.  Her daughter stated that [facility] was supposed to call this week to set up her PT.  Pt was taken off of several medications after this hospital stay including furosemide.  I called [name] and advised her of the same.  She scheduled [name].  Pt stated that she feels a lot better; she was diagnosed with having a stroke.  [name] for about 3 weeks following her stay. [name] and getting around the house without difficulty.  rx bottles verified and pill box filled according to her discharge instructions.    BP (!) 142/80 (BP Location: Right Arm, Patient Position: Sitting, Cuff Size: Large)   Pulse 83   Resp 16   Wt 226 lb 3.2 oz (102.6 kg)   SpO2 95%   BMI 40.07 kg/m    Tara Dunn, EMT Paramedic 05/12/2017    ACTION: Home visit completed

## 2017-05-13 ENCOUNTER — Telehealth: Payer: Self-pay | Admitting: Registered Nurse

## 2017-05-13 NOTE — Telephone Encounter (Signed)
Transitional Care call Transitional Care Call Completed, Appointment Confirmed, Address Confirmed, New Patient Packet Mailed Transitional Care Call Questions answered by Daughter Ms. [name]  Patient name: Tara Dunn DOB: 1940-10-22 1. Are you/is patient experiencing any problems since coming home? Ms. [name] states she's having a hard time with getting her mother up. [facility] Call regarding when treatment will be initiated, she verbalizes understanding.  a. Are there any questions regarding any aspect of care? See the above 2. Are there any questions regarding medications administration/dosing? No a. Are meds being taken as prescribed? Yes b. "Patient should review meds with caller to confirm"  Medication List Reviewed 3. Have there been any falls? No 4. Has Home Health been to the house and/or have they contacted you? No, daughter has called, awaiting a return call. This Provider placed a call to [facility]. a. If not, have you tried to contact them? A call was placed to [place]. b. Can we help you contact them? Yes, [facility] Called Today, RN will be going out this evening.  5. Are bowels and bladder emptying properly? Yes a. Are there any unexpected incontinence issues? No b. If applicable, is patient following bowel/bladder programs? NA 6. Any fevers, problems with breathing, unexpected pain? No 7. Are there any skin problems or new areas of breakdown? No 8. Has the patient/family member arranged specialty MD follow up (ie cardiology/neurology/renal/surgical/etc.)?  Yes a. Can we help arrange? NA 9. Does the patient need any other services or support that we can help arrange? No 10. Are caregivers following through as expected in assisting the patient? Yes 11. Has the patient quit smoking, drinking alcohol, or using drugs as recommended? Ms. [name] reports her mother Ms. Laatsch doesn't smoke, drink alcohol or use illicit drugs.    Appointment date/time 05/19/2017, arrival time 12:40 for 1:00 appointment with Danella Sensing ANP at [address]

## 2017-05-16 DIAGNOSIS — Z6838 Body mass index (BMI) 38.0-38.9, adult: Secondary | ICD-10-CM | POA: Diagnosis not present

## 2017-05-16 DIAGNOSIS — I639 Cerebral infarction, unspecified: Secondary | ICD-10-CM | POA: Diagnosis not present

## 2017-05-16 DIAGNOSIS — N181 Chronic kidney disease, stage 1: Secondary | ICD-10-CM | POA: Diagnosis not present

## 2017-05-16 DIAGNOSIS — I10 Essential (primary) hypertension: Secondary | ICD-10-CM | POA: Diagnosis not present

## 2017-05-16 DIAGNOSIS — I509 Heart failure, unspecified: Secondary | ICD-10-CM | POA: Diagnosis not present

## 2017-05-17 ENCOUNTER — Encounter: Payer: Self-pay | Admitting: *Deleted

## 2017-05-17 ENCOUNTER — Other Ambulatory Visit: Payer: Self-pay | Admitting: *Deleted

## 2017-05-17 NOTE — Patient Outreach (Signed)
[facility]) Care Management  05/17/2017  Hortensia Sou 09-Jul-1940 [id number]    Transition of care (Unsuccessful)  RN attempted outreach call unsuccessful but was able to leave a HIPPA voice message requested a call back. Will continue outreach calls accordingly.  [name], RN Care Management Coordinator [place] Office [phone]

## 2017-05-18 ENCOUNTER — Ambulatory Visit (HOSPITAL_COMMUNITY)
Admission: RE | Admit: 2017-05-18 | Discharge: 2017-05-18 | Disposition: A | Discharge status: Home / Self Care | Payer: PPO | Source: Ambulatory Visit | Attending: Internal Medicine | Admitting: Internal Medicine

## 2017-05-18 ENCOUNTER — Encounter (HOSPITAL_COMMUNITY): Payer: Self-pay

## 2017-05-18 ENCOUNTER — Other Ambulatory Visit (HOSPITAL_COMMUNITY): Payer: Self-pay

## 2017-05-18 ENCOUNTER — Encounter: Payer: PPO | Admitting: *Deleted

## 2017-05-18 ENCOUNTER — Telehealth: Payer: Self-pay | Admitting: Cardiology

## 2017-05-18 VITALS — BP 174/108 | HR 82 | Wt 223.6 lb

## 2017-05-18 DIAGNOSIS — Z8673 Personal history of transient ischemic attack (TIA), and cerebral infarction without residual deficits: Secondary | ICD-10-CM | POA: Insufficient documentation

## 2017-05-18 DIAGNOSIS — I63312 Cerebral infarction due to thrombosis of left middle cerebral artery: Secondary | ICD-10-CM

## 2017-05-18 DIAGNOSIS — Z95810 Presence of automatic (implantable) cardiac defibrillator: Secondary | ICD-10-CM | POA: Diagnosis not present

## 2017-05-18 DIAGNOSIS — Z6839 Body mass index (BMI) 39.0-39.9, adult: Secondary | ICD-10-CM | POA: Diagnosis not present

## 2017-05-18 DIAGNOSIS — I428 Other cardiomyopathies: Secondary | ICD-10-CM | POA: Diagnosis not present

## 2017-05-18 DIAGNOSIS — I130 Hypertensive heart and chronic kidney disease with heart failure and stage 1 through stage 4 chronic kidney disease, or unspecified chronic kidney disease: Secondary | ICD-10-CM | POA: Insufficient documentation

## 2017-05-18 DIAGNOSIS — E669 Obesity, unspecified: Secondary | ICD-10-CM | POA: Diagnosis not present

## 2017-05-18 DIAGNOSIS — I5042 Chronic combined systolic (congestive) and diastolic (congestive) heart failure: Secondary | ICD-10-CM | POA: Diagnosis not present

## 2017-05-18 DIAGNOSIS — I5022 Chronic systolic (congestive) heart failure: Secondary | ICD-10-CM | POA: Diagnosis not present

## 2017-05-18 DIAGNOSIS — N183 Chronic kidney disease, stage 3 unspecified: Secondary | ICD-10-CM

## 2017-05-18 DIAGNOSIS — I10 Essential (primary) hypertension: Secondary | ICD-10-CM | POA: Diagnosis not present

## 2017-05-18 DIAGNOSIS — Z853 Personal history of malignant neoplasm of breast: Secondary | ICD-10-CM | POA: Insufficient documentation

## 2017-05-18 DIAGNOSIS — G4733 Obstructive sleep apnea (adult) (pediatric): Secondary | ICD-10-CM | POA: Insufficient documentation

## 2017-05-18 DIAGNOSIS — Z87891 Personal history of nicotine dependence: Secondary | ICD-10-CM | POA: Diagnosis not present

## 2017-05-18 DIAGNOSIS — R5381 Other malaise: Secondary | ICD-10-CM

## 2017-05-18 DIAGNOSIS — M109 Gout, unspecified: Secondary | ICD-10-CM | POA: Insufficient documentation

## 2017-05-18 DIAGNOSIS — I2510 Atherosclerotic heart disease of native coronary artery without angina pectoris: Secondary | ICD-10-CM | POA: Insufficient documentation

## 2017-05-18 DIAGNOSIS — Z79899 Other long term (current) drug therapy: Secondary | ICD-10-CM | POA: Diagnosis not present

## 2017-05-18 DIAGNOSIS — Z7902 Long term (current) use of antithrombotics/antiplatelets: Secondary | ICD-10-CM | POA: Diagnosis not present

## 2017-05-18 DIAGNOSIS — Z9012 Acquired absence of left breast and nipple: Secondary | ICD-10-CM | POA: Diagnosis not present

## 2017-05-18 LAB — BASIC METABOLIC PANEL
ANION GAP: 11 (ref 5–15)
BUN: 34 mg/dL — ABNORMAL HIGH (ref 6–20)
CALCIUM: 10.1 mg/dL (ref 8.9–10.3)
CO2: 21 mmol/L — ABNORMAL LOW (ref 22–32)
Chloride: 110 mmol/L (ref 101–111)
Creatinine, Ser: 1.69 mg/dL — ABNORMAL HIGH (ref 0.44–1.00)
GFR, EST AFRICAN AMERICAN: 33 mL/min — AB (ref >60)
GFR, EST NON AFRICAN AMERICAN: 28 mL/min — AB (ref >60)
GLUCOSE: 124 mg/dL — AB (ref 65–99)
POTASSIUM: 5.1 mmol/L (ref 3.5–5.1)
SODIUM: 142 mmol/L (ref 135–145)

## 2017-05-18 MED ORDER — FUROSEMIDE 20 MG PO TABS: 20.0000 mg | ORAL_TABLET | Freq: Every day | ORAL | 3 refills | Status: DC

## 2017-05-18 NOTE — Progress Notes (Signed)
Paramedicine Encounter   Patient ID: Tara Dunn , female,   DOB: Jul 11, 1940,76 y.o.,  MRN: [id number]   Met patient in clinic today with provider Amy.   Pt is hypertensive during this visit, but she just took her meds just prior to the visit.  Pt appears to be down in spirits today but she states that she feels fine.  She's still living with her daughter and Amy discussed the possible need for her to stay there longer than expected.  Pt was taken off of lasix after her hospital stay and she's retaining "a little fluid" in her legs.  Pt stated that she does have some sob sometimes when she's walking.  Her daughter stated that it would benefit them if pt had a wheelchair for when they go out of the house.  PT still hasn't started and pt's ability to lift her leg when getting into bed has gotten worse.  Pt stated that she has a good appetite and is eating good.  The scale at home "went out last week" possibly need more batteries.  Pt left her pill box therefore I will re-visit her tomorrow.  Her daughter was shown how to cut the lasix in half due to it being 16m.    **rx changes: Restart lasix 20 mg QIB  Time spent with patient 35 mins  Severina Sykora, EMT-Paramedic 05/18/2017   ACTION: Next visit planned for tomorrow

## 2017-05-18 NOTE — Telephone Encounter (Signed)
Confirmed remote transmission w/ pt daughter.   

## 2017-05-18 NOTE — Progress Notes (Signed)
Patient ID: Tara Dunn, female   DOB: 04/21/1940, 77 y.o.   MRN: [id number]     Advanced Heart Failure Clinic Note   PCP: Dr. Ernie Hew HF: Dr. Aundra Dubin   MS Betty is a 77 yo with long history of HTN, CVAs, CKD, and systolic CHF.    In 3/16, she was admitted a hypertensive emergency and acute CHF.  CTA chest showed no PE.  Echo showed EF 25-30% with moderate LVH.  She was diuresed and had a cardiac catheterization given extensive coronary calcification seen on CTA chest.  Cath showed diffuse moderate CAD, worst was 50-70% mRCA stenosis.   Admitted 04/19/2017 with RLE weakness. Had CVA. Later admitted to CIR. 04/22/2017- 05/11/2017. Entresto and lasix stopped. Discharged to her daughter house.   Today she returns for post hospital follow up. Overall feeling fine. Denies SOB/PND/Orthopnea. Having some leg edma. Has residual RLE weakness.  Appetite ok. No fever or chills. Weight at home 218 pounds. Not weighing every day. Taking all medications. Living with her daughter. Requires assistance getting the bed. She is unable to move her legs on to the bed. Hard for her to go from sitting to standing.        Optivol:  Trending up. No VT/AF. Activity <1 hour per day.   Labs (3/16): K 4.2, creatinine 1.44, HCT 39.7 Labs (06/05/14): K 4.1, creatinine 2.01 Labs (5/16): K 4.7, creatinine 1.56 Labs (7/16): K 4.7, creatinine 1.68, BNP 14, LDL 62 Labs (11/16): K 4.2, creatinine 1.93 Labs (1/17): K 4.6, creatinine 1.65, HCT 39.2 Labs (4/17): K 4.5, creatinine 1.98 Labs (6/17): K 4, creatinine 2 Labs (8/17): K 4.4, creatinine 1.64   Labs (10/17): K 3.9, creatinine 1.5, BNP 15 Labs (1/18): K 4.3, creatinine 1.59 Labs (4/18): LDL 46, HDL 57 Labs (5/18): K 4.6, creatinine 1.55 Labs (10/08/2016): K 4 Creatinine 1.9  Labs (2/82019): K 4.2 Creatinine 2.24 Labs (05/09/2017): K 4.5 Creatinine 1.73, Hgb 9.9    PMH: 1. CVA: 3 prior CVAs, last in 1998.  Thought to be related to HTN. 2. HTN: Long-standing, says she  has been hypertensive since age 10. Renal artery dopplers (4/16) were normal.  3. CKD: Suspect related to HTN. 4. Breast cancer: 2009, left mastectomy with chemotherapy.  5. Gout 6. Obese 7. TAH 8. CAD: LHC (3/16) with moderate diffuse disease, worst lesion being 50-70% mid RCA.  9. Cardiomyopathy: Echo (3/16) with EF 25-30%, moderate LVH.  Echo (6/16) with EF 35-40%, moderate LVH, diffuse hypokinesis with septal-lateral dyssynchrony.  Cardiac MRI (12/16) with EF 32%, diffuse hypokinesis, no contrast given due to CKD.  - Medtronic ICD 2/17.  - Echo (5/18): EF 40%, diffuse hypokinesis, moderate LVH, normal RV size and systolic function.  -ECHO 04/20/2017- EF 45-50% Grade IDD 10. OSA: Using CPAP.  11. Event monitor (1/17) with NSR, occasional PVCs.   SH: From [place], moved to [place] in 2015 to be near daughter.  Single.  Quit smoking 2009.    FH: HTN runs in family.  Father with MI.   ROS: All systems reviewed and negative except as per HPI.   Current Outpatient Medications  Medication Sig Dispense Refill  . atorvastatin (LIPITOR) 80 MG tablet Take 1 tablet (80 mg total) by mouth daily at 6 PM. 30 tablet 1  . carvedilol (COREG) 25 MG tablet Take 1 tablet (25 mg total) by mouth 2 (two) times daily. 60 tablet 0  . isosorbide-hydrALAZINE (BIDIL) 20-37.5 MG tablet Take 2 tablets by mouth 2 (two) times daily. 120 tablet  11  . omeprazole (PRILOSEC) 40 MG capsule Take 1 capsule (40 mg total) by mouth daily. 30 capsule 0  . allopurinol (ZYLOPRIM) 100 MG tablet Take 2 tablets (200 mg total) by mouth daily. 30 tablet 0  . clopidogrel (PLAVIX) 75 MG tablet Take 1 tablet (75 mg total) by mouth daily. 30 tablet 0  . senna-docusate (SENOKOT-S) 8.6-50 MG tablet Take 1 tablet by mouth at bedtime as needed for mild constipation.     No current facility-administered medications for this encounter.      BP (!) 174/108   Pulse 82   Wt 223 lb 9.6 oz (101.4 kg)   SpO2 96%   BMI 39.61 kg/m    Wt Readings  from Last 3 Encounters:  05/18/17 223 lb 9.6 oz (101.4 kg)  05/12/17 226 lb 3.2 oz (102.6 kg)  05/11/17 217 lb (98.4 kg)   General:  No resp difficulty. Arrived in wheel chair. Daughter present.  HEENT: normal Neck: supple. no JVD. Carotids 2+ bilat; no bruits. No lymphadenopathy or thryomegaly appreciated. Cor: PMI nondisplaced. Regular rate & rhythm. No rubs, gallops or murmurs. Lungs: clear Abdomen: soft, nontender, nondistended. No hepatosplenomegaly. No bruits or masses. Good bowel sounds. Extremities: no cyanosis, clubbing, rash, RLE and LLE 1+ edema. RLE weak 4/5  Neuro: alert & orientedx3, cranial nerves grossly intact. moves all 4 extremities w/o difficulty. Affect pleasant  Assessment/Plan: 1. HTN: Has history of CVA and CKD.  Renal artery dopplers were normal.   Elevated but just had morning medications.  2. CAD: Moderate diffuse CAD, no severe obstruction.  This did not cause her cardiomyopathy.  -No s/s ischemia   - Continue ASA 81 - Continue atorvastatin 40 mg daily, good lipids in 4/18.    3. CKD III:  . - I reviewed BMET from 05/09/2017. Stable.     4. 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  ECHO 04/2017 EF 45-50%  NYHA II-IIIb. Volume status trending up. Restart lasix 20 mg daily.   Medtronic ICD- optivol with fluid trending up to index. Activity < 1 hour per day.  - Hold off on entresto for now.  - Continue carvedilol 25 mg twice a day.  - Continue bidil 2 tabs three times a day.  5. OSA: Needs CPAP fixed. We discussed. 6. CVA On statin and plavix.   7. Deconditioning- Continue HHPT/HHOT.    Follow up in 3 months. Continue paramedicine. Continue [facility] for [facility] [place], [place], NP-C  05/18/2017

## 2017-05-18 NOTE — Patient Instructions (Addendum)
Labs today (will call for abnormal results, otherwise no news is good news)  START taking lasix 20 mg (1 Tablet) Once Daily  Follow up in 3 months, we will call you to schedule appointment. If you don't hear from [place] please call us in June to schedule your follow up.

## 2017-05-19 ENCOUNTER — Other Ambulatory Visit (HOSPITAL_COMMUNITY): Payer: Self-pay

## 2017-05-19 ENCOUNTER — Encounter (HOSPITAL_COMMUNITY): Payer: Self-pay

## 2017-05-19 ENCOUNTER — Encounter: Payer: Self-pay | Admitting: Registered Nurse

## 2017-05-19 ENCOUNTER — Encounter: Payer: Self-pay | Admitting: Cardiology

## 2017-05-19 ENCOUNTER — Encounter: Payer: PPO | Attending: Registered Nurse | Admitting: Registered Nurse

## 2017-05-19 VITALS — BP 114/73 | HR 81 | Ht 63.0 in | Wt 218.0 lb

## 2017-05-19 DIAGNOSIS — I130 Hypertensive heart and chronic kidney disease with heart failure and stage 1 through stage 4 chronic kidney disease, or unspecified chronic kidney disease: Secondary | ICD-10-CM | POA: Insufficient documentation

## 2017-05-19 DIAGNOSIS — Z8719 Personal history of other diseases of the digestive system: Secondary | ICD-10-CM | POA: Diagnosis not present

## 2017-05-19 DIAGNOSIS — N183 Chronic kidney disease, stage 3 (moderate): Secondary | ICD-10-CM | POA: Diagnosis not present

## 2017-05-19 DIAGNOSIS — I509 Heart failure, unspecified: Secondary | ICD-10-CM | POA: Insufficient documentation

## 2017-05-19 DIAGNOSIS — G4733 Obstructive sleep apnea (adult) (pediatric): Secondary | ICD-10-CM | POA: Diagnosis not present

## 2017-05-19 DIAGNOSIS — E669 Obesity, unspecified: Secondary | ICD-10-CM | POA: Insufficient documentation

## 2017-05-19 DIAGNOSIS — I10 Essential (primary) hypertension: Secondary | ICD-10-CM | POA: Diagnosis not present

## 2017-05-19 DIAGNOSIS — Z87891 Personal history of nicotine dependence: Secondary | ICD-10-CM | POA: Diagnosis not present

## 2017-05-19 DIAGNOSIS — Z9012 Acquired absence of left breast and nipple: Secondary | ICD-10-CM | POA: Insufficient documentation

## 2017-05-19 DIAGNOSIS — Z6839 Body mass index (BMI) 39.0-39.9, adult: Secondary | ICD-10-CM | POA: Diagnosis not present

## 2017-05-19 DIAGNOSIS — I63312 Cerebral infarction due to thrombosis of left middle cerebral artery: Secondary | ICD-10-CM

## 2017-05-19 DIAGNOSIS — K219 Gastro-esophageal reflux disease without esophagitis: Secondary | ICD-10-CM | POA: Insufficient documentation

## 2017-05-19 DIAGNOSIS — I69351 Hemiplegia and hemiparesis following cerebral infarction affecting right dominant side: Secondary | ICD-10-CM | POA: Insufficient documentation

## 2017-05-19 DIAGNOSIS — I429 Cardiomyopathy, unspecified: Secondary | ICD-10-CM | POA: Diagnosis not present

## 2017-05-19 DIAGNOSIS — M109 Gout, unspecified: Secondary | ICD-10-CM | POA: Diagnosis not present

## 2017-05-19 DIAGNOSIS — Z95810 Presence of automatic (implantable) cardiac defibrillator: Secondary | ICD-10-CM | POA: Insufficient documentation

## 2017-05-19 DIAGNOSIS — Z853 Personal history of malignant neoplasm of breast: Secondary | ICD-10-CM | POA: Insufficient documentation

## 2017-05-19 DIAGNOSIS — G8191 Hemiplegia, unspecified affecting right dominant side: Secondary | ICD-10-CM

## 2017-05-19 NOTE — Progress Notes (Signed)
Subjective:    Patient ID: Tara Dunn, female    DOB: 11-14-1940, 77 y.o.   MRN: [id number]  HPI: [name] is a 77 year old female who is here for a transitional care visit in follow up of her CVA, right hemiparesis and hypertension. She has been home with Home Health Therapies from [place]. She denies any pain. Also reports good appetite. Her current exercise regime is walking in the home with walker and receiving physical therapy.   Daughter in room and inquiring about hospital bed, spoke with Dr. Posey Pronto he hasn't receive any recommendation from physical therapist. I spoke with daughter in length and recommended the physical therapist send [place] a referral, she verbalizes understanding.    Pain Inventory Average Pain 0 Pain Right Now 0 My pain is no pain  In the last 24 hours, has pain interfered with the following? General activity 0 Relation with others 0 Enjoyment of life 0 What TIME of day is your pain at its worst? no pain Sleep (in general) Good  Pain is worse with: no pain Pain improves with: no pain Relief from Meds: no pain  Mobility walk with assistance use a walker  Function not employed: date last employed . I need assistance with the following:  bathing  Neuro/Psych bladder control problems bowel control problems  Prior Studies transitional  Physicians involved in your care transitional   Family History  Problem Relation Age of Onset  . High blood pressure Mother        Died @ 68.  . High blood pressure Father   . Heart attack Father        Died in his early 16's.  . High blood pressure Sister   . Cancer Sister   . Cancer Daughter   . Heart disease Daughter    Social History   Socioeconomic History  . Marital status: Widowed    Spouse name: Not on file  . Number of children: Not on file  . Years of education: Not on file  . Highest education level: Not on file  Occupational History  . Not on file  Social Needs   . Financial resource strain: Not on file  . Food insecurity:    Worry: Not on file    Inability: Not on file  . Transportation needs:    Medical: Not on file    Non-medical: Not on file  Tobacco Use  . Smoking status: Former Smoker    Packs/day: 0.50    Years: 40.00    Pack years: 20.00    Types: Cigarettes  . Smokeless tobacco: Never Used  . Tobacco comment: Quit in 2009.  Substance and Sexual Activity  . Alcohol use: No    Alcohol/week: 0.0 oz    Comment: "drank occasionally when I was young; last drink was in the late 1990s"  . Drug use: No  . Sexual activity: Never  Lifestyle  . Physical activity:    Days per week: Not on file    Minutes per session: Not on file  . Stress: Not on file  Relationships  . Social connections:    Talks on phone: Not on file    Gets together: Not on file    Attends religious service: Not on file    Active member of club or organization: Not on file    Attends meetings of clubs or organizations: Not on file    Relationship status: Not on file  Other Topics Concern  .  Not on file  Social History Narrative   Lives alone in an independent living facility.  Education: 10th grade.     Retired from [employer] work.  Moved here from [place] in 2015.   Past Surgical History:  Procedure Laterality Date  . ABDOMINAL HYSTERECTOMY    . BREAST BIOPSY Left 2008  . CATARACT EXTRACTION, BILATERAL Bilateral   . DILATION AND CURETTAGE OF UTERUS    . EP IMPLANTABLE DEVICE N/A 03/26/2015   MDT single chamber ICD, Dr. Lovena Le  . LEFT HEART CATHETERIZATION WITH CORONARY ANGIOGRAM N/A 05/07/2014   Procedure: LEFT HEART CATHETERIZATION WITH CORONARY ANGIOGRAM;  Surgeon: Belva Crome, MD;  Location[facility] CATH LAB;  Service: Cardiovascular;  Laterality: N/A;  . MASTECTOMY Left 2009   Past Medical History:  Diagnosis Date  . 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 Lovena Le  . Aortic dilatation ([place])    a. 04/2014 CTA chest w/ incidental  finding of distal thoracic Ao enlargement of 3.39 mm - f/u needed 04/2015.  . Arthritis    "all over my body"  . Cancer of left breast ([place]) 2009   s/p L mastectomy and chemo.  . Cardiomyopathy ([place])    a. 04/2014 Echo: EF 25-30%, mod conc LVH, possibl antsept HK, Gr1 DD, mod-sev dil LA.  . CHF (congestive heart failure) ([place])   . CKD (chronic kidney disease), stage III ([place])   . Depression   . GERD (gastroesophageal reflux disease)   . Gout   . Heart murmur   . History of stomach ulcers   . Hypertension    a. Dx @ age 16;  b. 04/2014 admission for HTN emergency.  . Obesity (BMI 30-39.9) 01/21/2015  . OSA on CPAP   . Stroke [facility])    a. 3 strokes - last in 1998; "memory problems since"  (03/26/2015)   BP 114/73 (BP Location: Right Arm, Patient Position: Sitting, Cuff Size: Large)   Pulse 81   Ht 5\' 3"  (1.6 m)   Wt 218 lb (98.9 kg)   SpO2 95%   BMI 38.62 kg/m   Opioid Risk Score:   Fall Risk Score:  `1  Depression screen PHQ 2/9  Depression screen [facility] 2/9 05/19/2017 05/28/2016 04/08/2016 04/06/2016 06/05/2014  Decreased Interest 0 0 0 0 0  Down, Depressed, Hopeless 1 0 0 1 0  PHQ - 2 Score 1 0 0 1 0  Altered sleeping 0 - - - -  Tired, decreased energy 0 - - - -  Change in appetite 0 - - - -  Feeling bad or failure about yourself  0 - - - -  Trouble concentrating 0 - - - -  Moving slowly or fidgety/restless 0 - - - -  Suicidal thoughts 0 - - - -  PHQ-9 Score 1 - - - -    Review of Systems  Constitutional: Negative.   HENT: Negative.   Eyes: Negative.   Respiratory: Negative.   Cardiovascular: Negative.   Gastrointestinal: Negative.   Endocrine: Negative.   Genitourinary: Negative.   Musculoskeletal: Positive for gait problem.  Skin: Negative.   Allergic/Immunologic: Negative.   Hematological: Negative.   All other systems reviewed and are negative.      Objective:   Physical Exam  Constitutional: She is oriented to person, place, and time. She appears  well-developed and well-nourished.  HENT:  Head: Normocephalic and atraumatic.  Neck: Normal range of motion. Neck supple.  Cardiovascular: Normal rate and regular rhythm.  Pulmonary/Chest: Effort normal and breath sounds normal.  Musculoskeletal:  Normal Muscle Bulk and Muscle Testing Reveals: Upper Extremities: Full ROM and Muscle Strength on the Right 4/5 and Left  5/5 Lower Extremities: Full ROM and Muscle Strength on the Right 4/5 and Left 5/5 Arises from Table slowly using walker for support  Gait is steady with normal steps. Tandem Gait is Normal  Neurological: She is alert and oriented to person, place, and time.  Skin: Skin is warm and dry.  Psychiatric: She has a normal mood and affect.  Nursing note and vitals reviewed.         Assessment & Plan:  1. 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 Erlinda Hong. 2. Hypertension: Continue current medication regimen: PCP Following. Blood Pressure stable today.   Follow up with Dr. Posey Pronto in 4-6 weeks

## 2017-05-20 ENCOUNTER — Other Ambulatory Visit: Payer: Self-pay | Admitting: *Deleted

## 2017-05-20 NOTE — Patient Outreach (Addendum)
[facility]) Care Management  05/20/2017  Tara Dunn January 18, 1941 [id number]    Transition of care  RN spoke with pt's daughter [name] who indicates pt is doing well and had a visit today via [employer], manufacturing. States PT continues to work with pt who is make progress however daughter has requested a hospital bed. RN encouraged her to make this requested via [place] to the PT involved with the pt at this time. RN indicated this request can be presented to the provider for a prescription if this is a needed necessity. Daughter also Par-medics continues to visit for medication consultation. No issues or problems have occur since our last conversation. RN continue to offer home visits however daughter receptive to the ongoing transition of care calls at this time. Due to pt's history of HF inquired on daily weights and stress the importance of why this was importance. Daughter states she weights the pt however has not been documenting all her weights. Encouraged adherence for providers to view if necessary and why this was importance due to pt's history of bilateral swelling to her LE. Verified no falls or injuries since last conversation and stress the importance of using all her DME (medical equipment) to prevent risk of falls/injuries. Plan of care discussed and review accordingly with goals and interventions adjusted based upon pt's progress. RN strongly encouraged ongoing adherence to the plan of care to prevention acute events. Will continue with transition of care calls and follow up next week. Will requested additional time to completed the initial assessment due to daughter pressed for time today ad unable to completed this task. No addition request or inquires at this time.   [name], RN Care Management Coordinator [place] Office [phone]

## 2017-05-20 NOTE — Progress Notes (Signed)
Paramedicine Encounter    Patient ID: Tara Dunn, female    DOB: [birth year], 77 y.o.   MRN: [id number]    Patient Care Team: Fanny Bien, MD as PCP - General (Family Medicine) Tobi Bastos, RN as [place] Management  Patient Active Problem List   Diagnosis Date Noted  . Labile blood pressure   . Hypoalbuminemia due to protein-calorie malnutrition ([place])   . Acute blood loss anemia   . AKI (acute kidney injury) ([place])   . Peripheral edema   . Stage 3 chronic kidney disease ([place])   . Benign essential HTN   . History of CVA with residual deficit   . Cerebrovascular accident (CVA) due to thrombosis of left middle cerebral artery ([place])   . Right leg weakness 04/19/2017  . Right hemiparesis ([place]) 04/19/2017  . 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 Joint pain 12/09/2015  . ICD (implantable cardioverter-defibrillator) in place 03/26/2015  . Syncope 03/06/2015  . Obesity (BMI 30-39.9) 01/21/2015  . Excessive daytime sleepiness 08/04/2014  . OSA (obstructive sleep apnea) 08/04/2014  . Essential hypertension 06/13/2014  . Chronic systolic CHF (congestive heart failure) ([place]) 05/20/2014  . Congestive heart disease ([place])   . Chronic combined systolic and diastolic CHF (congestive heart failure) ([place]) 05/03/2014  . CKD (chronic kidney disease), stage III ([place]) 05/03/2014  . Hx of stroke without residual deficits 05/03/2014    Current Outpatient Medications:  .  allopurinol (ZYLOPRIM) 100 MG tablet, Take 2 tablets (200 mg total) by mouth daily., Disp: 30 tablet, Rfl: 0 .  atorvastatin (LIPITOR) 80 MG tablet, Take 1 tablet (80 mg total) by mouth daily at 6 PM., Disp: 30 tablet, Rfl: 1 .  carvedilol (COREG) 25 MG tablet, Take 1 tablet (25 mg total) by mouth 2 (two) times daily., Disp: 60 tablet, Rfl: 0 .  clopidogrel (PLAVIX) 75 MG tablet, Take 1 tablet (75 mg total) by mouth daily., Disp: 30 tablet, Rfl: 0 .  furosemide (LASIX) 20 MG tablet, Take 1  tablet (20 mg total) by mouth daily., Disp: 90 tablet, Rfl: 3 .  isosorbide-hydrALAZINE (BIDIL) 20-37.5 MG tablet, Take 2 tablets by mouth 2 (two) times daily., Disp: 120 tablet, Rfl: 11 .  omeprazole (PRILOSEC) 40 MG capsule, Take 1 capsule (40 mg total) by mouth daily., Disp: 30 capsule, Rfl: 0 .  senna-docusate (SENOKOT-S) 8.6-50 MG tablet, Take 1 tablet by mouth at bedtime as needed for mild constipation., Disp: , Rfl:  Allergies  Allergen Reactions  . Shellfish Allergy Hives     Social History   Socioeconomic History  . Marital status: Widowed    Spouse name: Not on file  . Number of children: Not on file  . Years of education: Not on file  . Highest education level: Not on file  Occupational History  . Not on file  Social Needs  . Financial resource strain: Not on file  . Food insecurity:    Worry: Not on file    Inability: Not on file  . Transportation needs:    Medical: Not on file    Non-medical: Not on file  Tobacco Use  . Smoking status: Former Smoker    Packs/day: 0.50    Years: 40.00    Pack years: 20.00    Types: Cigarettes  . Smokeless tobacco: Never Used  . Tobacco comment: Quit in 2009.  Substance and Sexual Activity  . Alcohol use: No    Alcohol/week: 0.0 oz    Comment: "drank occasionally  when I was young; last drink was in the late 1990s"  . Drug use: No  . Sexual activity: Never  Lifestyle  . Physical activity:    Days per week: Not on file    Minutes per session: Not on file  . Stress: Not on file  Relationships  . Social connections:    Talks on phone: Not on file    Gets together: Not on file    Attends religious service: Not on file    Active member of club or organization: Not on file    Attends meetings of clubs or organizations: Not on file    Relationship status: Not on file  . Intimate partner violence:    Fear of current or ex partner: Not on file    Emotionally abused: Not on file    Physically abused: Not on file    Forced  sexual activity: Not on file  Other Topics Concern  . Not on file  Social History Narrative   Lives alone in an independent living facility.  Education: 10th grade.     Retired from [employer] work.  Moved here from [place] in 2015.    Physical Exam      Future Appointments  Date Time Provider [place]  05/26/2017 10:15 AM Tobi Bastos, RN [facility] None  06/21/2017  3:45 PM Venancio Poisson, NP [facility] None  06/23/2017 10:20 AM Jamse Arn, MD [facility] CPR    ATF [name] x4 sitting in chair with no complaints.  todays visit is to add furosemide to her pill box.    BP 128/80 (BP Location: Right Arm, Patient Position: Sitting)   Pulse 86   Resp 16   Wt 218 lb (98.9 kg)   SpO2 98%   BMI 38.62 kg/m     Yalena Colon, EMT Paramedic 05/20/2017    ACTION: Next visit planned for next week

## 2017-05-26 ENCOUNTER — Other Ambulatory Visit: Payer: Self-pay | Admitting: *Deleted

## 2017-05-26 ENCOUNTER — Encounter: Payer: Self-pay | Admitting: *Deleted

## 2017-05-26 NOTE — Patient Outreach (Signed)
[facility]) Care Management  05/26/2017  Tara Dunn 10/24/1940 [id number]    Transition of care  RN spoke with pt today briefly and introduced [facility] once again and the purpose for today's call. Pt requested RN to speak with her daughter Tara Dunn who updated RN on pt's progress. States HHRN and PT remain involved with twice a week visits. Daughter states she has looked into other programs such as the [facility] but the information is currently at the pt's home who is currently living with her since her recent discharge from the hospital. Reports ongoing swelling to her bilateral LE but no more then her usual. Pt is aware to elevate throughout the day to reduce further swelling. Pt was started on a diuretic since the last conversation and office visits with her providers with good tolerance to this medications. All medication were reviewed today and pt has been adherence with her medications and all medical appointments. No falls or injuries reported as pt continue to use her rolling walker both inside and outside the home. Note pt continue to get ongoing home visit through para-medicine. Plan of care discussed with some goals met and generated new and updated existing with discussed interventions on how to continue to progress in managing her ongoing care. Will follow up next week accordingly and inquire further on pt's progress. Due to pt's history of HF also discussed and educated caregiver on HF symptoms and the HF zones. Verified other then the minimal swelling pt remains in the GRE[place] but does not weight daily but no other present symptoms. Daughter continue to request the focus of this care on pt's recent stroke. RN inquired if any additional or returning symptoms have occurred (verified they have not). Reiterated on the F.A.S.T acronym and what to do if pt as acute symptoms. No other inquires or request at this time. Will schedule the next call for next week.  [name], RN Care Management Coordinator [place] Office [phone]

## 2017-05-27 ENCOUNTER — Other Ambulatory Visit (HOSPITAL_COMMUNITY): Payer: Self-pay

## 2017-05-27 ENCOUNTER — Encounter (HOSPITAL_COMMUNITY): Payer: Self-pay

## 2017-05-27 NOTE — Progress Notes (Signed)
Paramedicine Encounter    Patient ID: Tara Dunn, female    DOB: [birth year], 77 y.o.   MRN: [id number]    Patient Care Team: Fanny Bien, MD as PCP - General (Family Medicine) Tobi Bastos, RN as [place] Management  Patient Active Problem List   Diagnosis Date Noted  . Labile blood pressure   . Hypoalbuminemia due to protein-calorie malnutrition ([place])   . Acute blood loss anemia   . AKI (acute kidney injury) ([place])   . Peripheral edema   . Stage 3 chronic kidney disease ([place])   . Benign essential HTN   . History of CVA with residual deficit   . Cerebrovascular accident (CVA) due to thrombosis of left middle cerebral artery ([place])   . Right leg weakness 04/19/2017  . Right hemiparesis ([place]) 04/19/2017  . 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 Joint pain 12/09/2015  . ICD (implantable cardioverter-defibrillator) in place 03/26/2015  . Syncope 03/06/2015  . Obesity (BMI 30-39.9) 01/21/2015  . Excessive daytime sleepiness 08/04/2014  . OSA (obstructive sleep apnea) 08/04/2014  . Essential hypertension 06/13/2014  . Chronic systolic CHF (congestive heart failure) ([place]) 05/20/2014  . Congestive heart disease ([place])   . Chronic combined systolic and diastolic CHF (congestive heart failure) ([place]) 05/03/2014  . CKD (chronic kidney disease), stage III ([place]) 05/03/2014  . Hx of stroke without residual deficits 05/03/2014    Current Outpatient Medications:  .  allopurinol (ZYLOPRIM) 100 MG tablet, Take 2 tablets (200 mg total) by mouth daily., Disp: 30 tablet, Rfl: 0 .  atorvastatin (LIPITOR) 80 MG tablet, Take 1 tablet (80 mg total) by mouth daily at 6 PM., Disp: 30 tablet, Rfl: 1 .  carvedilol (COREG) 25 MG tablet, Take 1 tablet (25 mg total) by mouth 2 (two) times daily., Disp: 60 tablet, Rfl: 0 .  clopidogrel (PLAVIX) 75 MG tablet, Take 1 tablet (75 mg total) by mouth daily., Disp: 30 tablet, Rfl: 0 .  furosemide (LASIX) 20 MG tablet, Take 1  tablet (20 mg total) by mouth daily., Disp: 90 tablet, Rfl: 3 .  isosorbide-hydrALAZINE (BIDIL) 20-37.5 MG tablet, Take 2 tablets by mouth 2 (two) times daily., Disp: 120 tablet, Rfl: 11 .  omeprazole (PRILOSEC) 40 MG capsule, Take 1 capsule (40 mg total) by mouth daily., Disp: 30 capsule, Rfl: 0 .  senna-docusate (SENOKOT-S) 8.6-50 MG tablet, Take 1 tablet by mouth at bedtime as needed for mild constipation., Disp: , Rfl:  Allergies  Allergen Reactions  . Shellfish Allergy Hives     Social History   Socioeconomic History  . Marital status: Widowed    Spouse name: Not on file  . Number of children: Not on file  . Years of education: Not on file  . Highest education level: Not on file  Occupational History  . Not on file  Social Needs  . Financial resource strain: Not on file  . Food insecurity:    Worry: Not on file    Inability: Not on file  . Transportation needs:    Medical: Not on file    Non-medical: Not on file  Tobacco Use  . Smoking status: Former Smoker    Packs/day: 0.50    Years: 40.00    Pack years: 20.00    Types: Cigarettes  . Smokeless tobacco: Never Used  . Tobacco comment: Quit in 2009.  Substance and Sexual Activity  . Alcohol use: No    Alcohol/week: 0.0 oz    Comment: "drank occasionally  when I was young; last drink was in the late 1990s"  . Drug use: No  . Sexual activity: Never  Lifestyle  . Physical activity:    Days per week: Not on file    Minutes per session: Not on file  . Stress: Not on file  Relationships  . Social connections:    Talks on phone: Not on file    Gets together: Not on file    Attends religious service: Not on file    Active member of club or organization: Not on file    Attends meetings of clubs or organizations: Not on file    Relationship status: Not on file  . Intimate partner violence:    Fear of current or ex partner: Not on file    Emotionally abused: Not on file    Physically abused: Not on file    Forced  sexual activity: Not on file  Other Topics Concern  . Not on file  Social History Narrative   Lives alone in an independent living facility.  Education: 10th grade.     Retired from [employer] work.  Moved here from [place] in 2015.    Physical Exam  Pulmonary/Chest: Effort normal. No respiratory distress.  Abdominal: Soft.  Musculoskeletal: She exhibits edema.  Right foot and lower leg/normal for pt  Skin: Skin is warm and dry. She is not diaphoretic.        Future Appointments  Date Time Provider [place]  06/03/2017  1:45 PM Tobi Bastos, RN THN-COM None  06/21/2017  3:45 PM Venancio Poisson, NP GNA-GNA None  06/23/2017 10:20 AM Jamse Arn, MD CPR-PRMA CPR    ATF [name] x4 sitting in the kitchen with no complaints. Pt stated that her daughter and family were at her apartment, "cleaning it out". Pt stated that she really did not want to give up her apartment but she knows that it is necessary.  Pt stated that she has been working with PT and she feels better about walking around.  She stated that she sat on the porch and walked around outside yesterday.  Pt has taken all of her medications besides this am meds.  Her pill box was empty therefore she took her morning meds during our visit.  Pt denies sob, chest pain and dizziness.  Pt's vitals noted; pill box refilled.  BP 118/80 (BP Location: Right Arm, Patient Position: Sitting, Cuff Size: Large)   Pulse 76   Resp 16   Wt 216 lb 12.8 oz (98.3 kg)   SpO2 98%   BMI 38.40 kg/m   Weight yesterday-unknown Last visit weight-218  **rx called  (pharmacy changed to [facility] [address] [place])t: bidil 2 a day  plavix 1 day 75 mg   Avalina Benko, EMT Paramedic 05/27/2017    ACTION: Home visit completed

## 2017-06-01 ENCOUNTER — Other Ambulatory Visit (HOSPITAL_COMMUNITY): Payer: Self-pay

## 2017-06-01 ENCOUNTER — Encounter (HOSPITAL_COMMUNITY): Payer: Self-pay

## 2017-06-01 NOTE — Progress Notes (Signed)
Paramedicine Encounter    Patient ID: Tara Dunn, female    DOB: [birth year], 77 y.o.   MRN: [id number]    Patient Care Team: Fanny Bien, MD as PCP - General (Family Medicine) Tobi Bastos, RN as [place] Management  Patient Active Problem List   Diagnosis Date Noted  . Labile blood pressure   . Hypoalbuminemia due to protein-calorie malnutrition ([place])   . Acute blood loss anemia   . AKI (acute kidney injury) ([place])   . Peripheral edema   . Stage 3 chronic kidney disease ([place])   . Benign essential HTN   . History of CVA with residual deficit   . Cerebrovascular accident (CVA) due to thrombosis of left middle cerebral artery ([place])   . Right leg weakness 04/19/2017  . Right hemiparesis ([place]) 04/19/2017  . 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 Joint pain 12/09/2015  . ICD (implantable cardioverter-defibrillator) in place 03/26/2015  . Syncope 03/06/2015  . Obesity (BMI 30-39.9) 01/21/2015  . Excessive daytime sleepiness 08/04/2014  . OSA (obstructive sleep apnea) 08/04/2014  . Essential hypertension 06/13/2014  . Chronic systolic CHF (congestive heart failure) ([place]) 05/20/2014  . Congestive heart disease ([place])   . Chronic combined systolic and diastolic CHF (congestive heart failure) ([place]) 05/03/2014  . CKD (chronic kidney disease), stage III ([place]) 05/03/2014  . Hx of stroke without residual deficits 05/03/2014    Current Outpatient Medications:  .  allopurinol (ZYLOPRIM) 100 MG tablet, Take 2 tablets (200 mg total) by mouth daily., Disp: 30 tablet, Rfl: 0 .  atorvastatin (LIPITOR) 80 MG tablet, Take 1 tablet (80 mg total) by mouth daily at 6 PM., Disp: 30 tablet, Rfl: 1 .  carvedilol (COREG) 25 MG tablet, Take 1 tablet (25 mg total) by mouth 2 (two) times daily., Disp: 60 tablet, Rfl: 0 .  clopidogrel (PLAVIX) 75 MG tablet, Take 1 tablet (75 mg total) by mouth daily., Disp: 30 tablet, Rfl: 0 .  furosemide (LASIX) 20 MG tablet, Take 1  tablet (20 mg total) by mouth daily., Disp: 90 tablet, Rfl: 3 .  isosorbide-hydrALAZINE (BIDIL) 20-37.5 MG tablet, Take 2 tablets by mouth 2 (two) times daily., Disp: 120 tablet, Rfl: 11 .  omeprazole (PRILOSEC) 40 MG capsule, Take 1 capsule (40 mg total) by mouth daily., Disp: 30 capsule, Rfl: 0 .  senna-docusate (SENOKOT-S) 8.6-50 MG tablet, Take 1 tablet by mouth at bedtime as needed for mild constipation., Disp: , Rfl:  Allergies  Allergen Reactions  . Shellfish Allergy Hives     Social History   Socioeconomic History  . Marital status: Widowed    Spouse name: Not on file  . Number of children: Not on file  . Years of education: Not on file  . Highest education level: Not on file  Occupational History  . Not on file  Social Needs  . Financial resource strain: Not on file  . Food insecurity:    Worry: Not on file    Inability: Not on file  . Transportation needs:    Medical: Not on file    Non-medical: Not on file  Tobacco Use  . Smoking status: Former Smoker    Packs/day: 0.50    Years: 40.00    Pack years: 20.00    Types: Cigarettes  . Smokeless tobacco: Never Used  . Tobacco comment: Quit in 2009.  Substance and Sexual Activity  . Alcohol use: No    Alcohol/week: 0.0 oz    Comment: "drank occasionally  when I was young; last drink was in the late 1990s"  . Drug use: No  . Sexual activity: Never  Lifestyle  . Physical activity:    Days per week: Not on file    Minutes per session: Not on file  . Stress: Not on file  Relationships  . Social connections:    Talks on phone: Not on file    Gets together: Not on file    Attends religious service: Not on file    Active member of club or organization: Not on file    Attends meetings of clubs or organizations: Not on file    Relationship status: Not on file  . Intimate partner violence:    Fear of current or ex partner: Not on file    Emotionally abused: Not on file    Physically abused: Not on file    Forced  sexual activity: Not on file  Other Topics Concern  . Not on file  Social History Narrative   Lives alone in an independent living facility.  Education: 10th grade.     Retired from [employer] work.  Moved here from [place] in 2015.    Physical Exam  Pulmonary/Chest: Effort normal. No respiratory distress.  Abdominal: Soft. She exhibits no distension.  Musculoskeletal: She exhibits edema.  Edema right lower leg per her norm  Skin: Skin is warm and dry.        Future Appointments  Date Time Provider [place]  06/03/2017  1:45 PM Tobi Bastos, RN THN-COM None  06/21/2017  3:45 PM Venancio Poisson, NP GNA-GNA None  06/23/2017 10:20 AM Jamse Arn, MD CPR-PRMA CPR  06/27/2017  1:30 PM MC-HVSC PA/NP MC-HVSC None   ATF [name] x4 sitting in her bedroom, folding clothes.  Pt's daughter and family has been moving her stuff out of her apartment.  During this visit pt is still a little emotional about moving but she stated that "she's okay".  Pt's daughter informed me during this visit that while moving pt's things, she found a lot of pills in pt's drawer and throughout her bedroom.  She stated that it was the pills that was in pt's pill box.  Pt didn't deny that she had been hiding her medications; she doesn't give a reason why she isn't taking them.  Pt has complained in the past that she thinks that she is taking too many pills.  Although pt hasn't been taking her meds, her vitals and fluid levels has been good for most of our visits. I advised pt to take her medications and express her concerns at her next heart failure appointment.  Pt agrees.  rx bottles verified and pill box refilled.   BP 98/60 (BP Location: Left Arm, Patient Position: Sitting, Cuff Size: Large)   Pulse 86   Resp 16   Wt 219 lb (99.3 kg)   SpO2 98%   BMI 38.79 kg/m   Weight yesterday-cant weigh Last visit weight-216    Tara Dunn, EMT Paramedic 06/01/2017    ACTION: Next visit planned for next  week

## 2017-06-03 ENCOUNTER — Other Ambulatory Visit: Payer: Self-pay | Admitting: *Deleted

## 2017-06-03 NOTE — Patient Outreach (Signed)
[place] [facility]) Care Management  06/03/2017  Tara Dunn Feb 24, 1940 [id number]   Transition of care  RN spoke with [name] with an update on pt's progress. States HHRN/PT and para-medicine continue to work with pt with managing her care in the home. States pt "doing well" with some improvement with PT in getting her strength back. Pt continue to use her rolling walker and currently pending a hospital bed. Daughter states she has inquired and informed to contact the medical equipment dept with the [facility] agency to inquired on the delay. Pt aware to contact his RN case manager with any barriers or issues related to this assistance. Verified pt continue to take all her medications with no needed refills and attend all medical appointments with no delays. Denies any falls or related injuries and pt will have a follow up appointments with her primary provider on 2 weeks. RN continue to offer further engagement if needed to assist pt further however declined but very appreciative for the ongoing telephone contacts over the last few weeks to inquired further on pt's progress. Will verified pt again remains in the GREEN zone related to her HF symptoms other then the ongoing swelling history but no of perticiatating symptoms have occurred. Will review and further discussed the current plan of care adjust interventions accordingly to allow adherence. Will scheduled another follow up call next week to inquire further on pt's progress and continue to encouraged adherence to the plan of care.  [name], RN Care Management Coordinator [place] Office [phone]

## 2017-06-06 DIAGNOSIS — N184 Chronic kidney disease, stage 4 (severe): Secondary | ICD-10-CM | POA: Diagnosis not present

## 2017-06-08 ENCOUNTER — Other Ambulatory Visit: Payer: Self-pay | Admitting: *Deleted

## 2017-06-08 ENCOUNTER — Encounter: Payer: Self-pay | Admitting: *Deleted

## 2017-06-08 ENCOUNTER — Other Ambulatory Visit (HOSPITAL_COMMUNITY): Payer: Self-pay

## 2017-06-08 ENCOUNTER — Encounter (HOSPITAL_COMMUNITY): Payer: Self-pay

## 2017-06-08 NOTE — Patient Outreach (Signed)
[place] [facility]) Care Management  06/08/2017  Zhana Powe 14-May-1940 [id number]    Transition of care  RN attempted outreach call today however unsuccessful but able to leave a HIPAA voice message requesting a call back. Will continue to review the pt's plan of care and inquire on any needs. Will rescheduled another outreach call for transition of care.  [name], RN Care Management Coordinator [place] Office [phone]

## 2017-06-08 NOTE — Progress Notes (Signed)
Paramedicine Encounter    Patient ID: Tara Dunn, female    DOB: 1940-02-28, 77 y.o.   MRN: [id number]    Patient Care Team: Fanny Bien, MD as PCP - General (Family Medicine) Tobi Bastos, RN as [place] Management  Patient Active Problem List   Diagnosis Date Noted  . Labile blood pressure   . Hypoalbuminemia due to protein-calorie malnutrition ([place])   . Acute blood loss anemia   . AKI (acute kidney injury) ([place])   . Peripheral edema   . Stage 3 chronic kidney disease ([place])   . Benign essential HTN   . History of CVA with residual deficit   . Cerebrovascular accident (CVA) due to thrombosis of left middle cerebral artery ([place])   . Right leg weakness 04/19/2017  . Right hemiparesis ([place]) 04/19/2017  . 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 Joint pain 12/09/2015  . ICD (implantable cardioverter-defibrillator) in place 03/26/2015  . Syncope 03/06/2015  . Obesity (BMI 30-39.9) 01/21/2015  . Excessive daytime sleepiness 08/04/2014  . OSA (obstructive sleep apnea) 08/04/2014  . Essential hypertension 06/13/2014  . Chronic systolic CHF (congestive heart failure) ([place]) 05/20/2014  . Congestive heart disease ([place])   . Chronic combined systolic and diastolic CHF (congestive heart failure) ([place]) 05/03/2014  . CKD (chronic kidney disease), stage III ([place]) 05/03/2014  . Hx of stroke without residual deficits 05/03/2014    Current Outpatient Medications:  .  allopurinol (ZYLOPRIM) 100 MG tablet, Take 2 tablets (200 mg total) by mouth daily., Disp: 30 tablet, Rfl: 0 .  atorvastatin (LIPITOR) 80 MG tablet, Take 1 tablet (80 mg total) by mouth daily at 6 PM., Disp: 30 tablet, Rfl: 1 .  clopidogrel (PLAVIX) 75 MG tablet, Take 1 tablet (75 mg total) by mouth daily., Disp: 30 tablet, Rfl: 0 .  furosemide (LASIX) 20 MG tablet, Take 1 tablet (20 mg total) by mouth daily., Disp: 90 tablet, Rfl: 3 .  isosorbide-hydrALAZINE (BIDIL) 20-37.5 MG tablet,  Take 2 tablets by mouth 2 (two) times daily., Disp: 120 tablet, Rfl: 11 .  omeprazole (PRILOSEC) 40 MG capsule, Take 1 capsule (40 mg total) by mouth daily., Disp: 30 capsule, Rfl: 0 .  carvedilol (COREG) 25 MG tablet, Take 1 tablet (25 mg total) by mouth 2 (two) times daily., Disp: 60 tablet, Rfl: 0 .  senna-docusate (SENOKOT-S) 8.6-50 MG tablet, Take 1 tablet by mouth at bedtime as needed for mild constipation., Disp: , Rfl:  Allergies  Allergen Reactions  . Shellfish Allergy Hives     Social History   Socioeconomic History  . Marital status: Widowed    Spouse name: Not on file  . Number of children: Not on file  . Years of education: Not on file  . Highest education level: Not on file  Occupational History  . Not on file  Social Needs  . Financial resource strain: Not on file  . Food insecurity:    Worry: Not on file    Inability: Not on file  . Transportation needs:    Medical: Not on file    Non-medical: Not on file  Tobacco Use  . Smoking status: Former Smoker    Packs/day: 0.50    Years: 40.00    Pack years: 20.00    Types: Cigarettes  . Smokeless tobacco: Never Used  . Tobacco comment: Quit in 2009.  Substance and Sexual Activity  . Alcohol use: No    Alcohol/week: 0.0 oz    Comment: "drank occasionally  when I was young; last drink was in the late 1990s"  . Drug use: No  . Sexual activity: Never  Lifestyle  . Physical activity:    Days per week: Not on file    Minutes per session: Not on file  . Stress: Not on file  Relationships  . Social connections:    Talks on phone: Not on file    Gets together: Not on file    Attends religious service: Not on file    Active member of club or organization: Not on file    Attends meetings of clubs or organizations: Not on file    Relationship status: Not on file  . Intimate partner violence:    Fear of current or ex partner: Not on file    Emotionally abused: Not on file    Physically abused: Not on file     Forced sexual activity: Not on file  Other Topics Concern  . Not on file  Social History Narrative   Lives alone in an independent living facility.  Education: 10th grade.     Retired from [employer] work.  Moved here from [place] in 2015.    Physical Exam  Pulmonary/Chest: Effort normal. No respiratory distress. She has no wheezes. She has no rales.  Abdominal: Soft. She exhibits no distension.  Musculoskeletal: She exhibits no edema.  Skin: Skin is warm and dry. She is not diaphoretic.        Future Appointments  Date Time Provider [place]  06/13/2017 12:00 PM Tobi Bastos, RN THN-COM None  06/21/2017  3:45 PM Venancio Poisson, NP GNA-GNA None  06/23/2017 10:20 AM Jamse Arn, MD CPR-PRMA CPR  06/27/2017  1:30 PM MC-HVSC PA/NP MC-HVSC None    ATF [name] x4 in her bedroom just waking up for the day.  Pt stated that she has been sleeping fine with less trips to the bathroom at night.  Pt denies sob, dizziness and chest pain. Pt also denies bleeding. She stated that physical therapy stopped this week.  Pt feels there is no longer need for them to come out.  She stated that she feels stronger.  Pt stated that she is taking all of her medications and is no longer hiding them because of her daughter.  rx bottles verified and pill box refilled.   BP (!) 142/86 (BP Location: Right Arm, Patient Position: Sitting, Cuff Size: Large)   Pulse 75   Resp 16   Wt 209 lb 9.6 oz (95.1 kg)   SpO2 98%   BMI 37.13 kg/m   Weight yesterday-209 Last visit weight-219 (weighed in the kitchen)    **rx called in: Clopidogrel filled until Friday/none in sat (no response from dr. Ernie Hew office)  I called the office and spoke with nurse. She advised Dr. Ernie Hew didn't prescribe the medication therefore no refill be sent.  The discharging physician is responsible.  Allopurinol  Lennix Kneisel, EMT Paramedic 06/10/2017    ACTION: Home visit completed

## 2017-06-09 DIAGNOSIS — N184 Chronic kidney disease, stage 4 (severe): Secondary | ICD-10-CM | POA: Diagnosis not present

## 2017-06-09 DIAGNOSIS — C50919 Malignant neoplasm of unspecified site of unspecified female breast: Secondary | ICD-10-CM | POA: Diagnosis not present

## 2017-06-09 DIAGNOSIS — N179 Acute kidney failure, unspecified: Secondary | ICD-10-CM | POA: Diagnosis not present

## 2017-06-10 DIAGNOSIS — G4733 Obstructive sleep apnea (adult) (pediatric): Secondary | ICD-10-CM | POA: Diagnosis not present

## 2017-06-10 DIAGNOSIS — G8191 Hemiplegia, unspecified affecting right dominant side: Secondary | ICD-10-CM | POA: Diagnosis not present

## 2017-06-10 DIAGNOSIS — I5022 Chronic systolic (congestive) heart failure: Secondary | ICD-10-CM | POA: Diagnosis not present

## 2017-06-13 ENCOUNTER — Encounter: Payer: Self-pay | Admitting: *Deleted

## 2017-06-13 ENCOUNTER — Other Ambulatory Visit: Payer: Self-pay | Admitting: *Deleted

## 2017-06-13 ENCOUNTER — Ambulatory Visit: Payer: Self-pay | Admitting: *Deleted

## 2017-06-13 NOTE — Patient Outreach (Addendum)
[place] [facility]) Care Management  06/13/2017  Tara Dunn 1940/08/20 [id number]    Transition of care  RN attempted another outreach call however unsuccessful but RN able to leave a HIPAA approved voice message requesting a call back. Will inquired further on pt's progress and update the plan of care accordingly. Note pt's daughter [name] contacted RN on 5/2 with an update on pt's progress. RN was unsuccessful today with another outreach however will follow up once again this week with another attempt to reach this pt.   [name], RN Care Management Coordinator [place] Office [phone]

## 2017-06-13 NOTE — Patient Outreach (Signed)
[place] [facility]) Care Management  06/09/2017  Tara Dunn [birth year] [id number]   Documentation completed today 5/6 RN was not available on the day of contact  RN received a returned call via voice message from [name] on 5/2 indicating pt was doing well and continues to have services with the [facility] agency for nursing. States pt finally received the pending hospital bed and "everything was going well".  RN will follow up next week to inquire further and will update the plan of care/goal and interventions at that time.  Tara Mina, RN Care Management Coordinator [place] Office [phone]

## 2017-06-14 ENCOUNTER — Other Ambulatory Visit: Payer: Self-pay

## 2017-06-14 ENCOUNTER — Ambulatory Visit: Payer: Self-pay | Admitting: *Deleted

## 2017-06-14 DIAGNOSIS — K219 Gastro-esophageal reflux disease without esophagitis: Secondary | ICD-10-CM | POA: Diagnosis not present

## 2017-06-14 DIAGNOSIS — M170 Bilateral primary osteoarthritis of knee: Secondary | ICD-10-CM | POA: Diagnosis not present

## 2017-06-14 DIAGNOSIS — I10 Essential (primary) hypertension: Secondary | ICD-10-CM | POA: Diagnosis not present

## 2017-06-14 DIAGNOSIS — I639 Cerebral infarction, unspecified: Secondary | ICD-10-CM | POA: Diagnosis not present

## 2017-06-14 DIAGNOSIS — M1000 Idiopathic gout, unspecified site: Secondary | ICD-10-CM | POA: Diagnosis not present

## 2017-06-15 ENCOUNTER — Encounter (HOSPITAL_COMMUNITY): Payer: Self-pay

## 2017-06-15 ENCOUNTER — Other Ambulatory Visit (HOSPITAL_COMMUNITY): Payer: Self-pay

## 2017-06-15 NOTE — Progress Notes (Signed)
Paramedicine Encounter    Patient ID: Tara Dunn, female    DOB: 1940/08/12, 77 y.o.   MRN: [id number]    Patient Care Team: Fanny Bien, MD as PCP - General (Family Medicine) Tobi Bastos, RN as [place] Management  Patient Active Problem List   Diagnosis Date Noted  . Labile blood pressure   . Hypoalbuminemia due to protein-calorie malnutrition ([place])   . Acute blood loss anemia   . AKI (acute kidney injury) ([place])   . Peripheral edema   . Stage 3 chronic kidney disease ([place])   . Benign essential HTN   . History of CVA with residual deficit   . Cerebrovascular accident (CVA) due to thrombosis of left middle cerebral artery ([place])   . Right leg weakness 04/19/2017  . Right hemiparesis ([place]) 04/19/2017  . Thalamic infarct, acute ([place]) 04/19/2017  . Joint pain 12/09/2015  . ICD (implantable cardioverter-defibrillator) in place 03/26/2015  . Syncope 03/06/2015  . Obesity (BMI 30-39.9) 01/21/2015  . Excessive daytime sleepiness 08/04/2014  . OSA (obstructive sleep apnea) 08/04/2014  . Essential hypertension 06/13/2014  . Chronic systolic CHF (congestive heart failure) ([place]) 05/20/2014  . Congestive heart disease ([place])   . Chronic combined systolic and diastolic CHF (congestive heart failure) ([place]) 05/03/2014  . CKD (chronic kidney disease), stage III ([place]) 05/03/2014  . Hx of stroke without residual deficits 05/03/2014    Current Outpatient Medications:  .  allopurinol (ZYLOPRIM) 100 MG tablet, Take 2 tablets (200 mg total) by mouth daily., Disp: 30 tablet, Rfl: 0 .  atorvastatin (LIPITOR) 80 MG tablet, Take 1 tablet (80 mg total) by mouth daily at 6 PM., Disp: 30 tablet, Rfl: 1 .  carvedilol (COREG) 25 MG tablet, Take 1 tablet (25 mg total) by mouth 2 (two) times daily., Disp: 60 tablet, Rfl: 0 .  furosemide (LASIX) 20 MG tablet, Take 1 tablet (20 mg total) by mouth daily., Disp: 90 tablet, Rfl: 3 .  isosorbide-hydrALAZINE (BIDIL) 20-37.5 MG  tablet, Take 2 tablets by mouth 2 (two) times daily., Disp: 120 tablet, Rfl: 11 .  omeprazole (PRILOSEC) 40 MG capsule, Take 1 capsule (40 mg total) by mouth daily., Disp: 30 capsule, Rfl: 0 .  clopidogrel (PLAVIX) 75 MG tablet, Take 1 tablet (75 mg total) by mouth daily., Disp: 30 tablet, Rfl: 0 .  senna-docusate (SENOKOT-S) 8.6-50 MG tablet, Take 1 tablet by mouth at bedtime as needed for mild constipation., Disp: , Rfl:  Allergies  Allergen Reactions  . Shellfish Allergy Hives     Social History   Socioeconomic History  . Marital status: Widowed    Spouse name: Not on file  . Number of children: Not on file  . Years of education: Not on file  . Highest education level: Not on file  Occupational History  . Not on file  Social Needs  . Financial resource strain: Not on file  . Food insecurity:    Worry: Not on file    Inability: Not on file  . Transportation needs:    Medical: Not on file    Non-medical: Not on file  Tobacco Use  . Smoking status: Former Smoker    Packs/day: 0.50    Years: 40.00    Pack years: 20.00    Types: Cigarettes  . Smokeless tobacco: Never Used  . Tobacco comment: Quit in 2009.  Substance and Sexual Activity  . Alcohol use: No    Alcohol/week: 0.0 oz    Comment: "drank occasionally  when I was young; last drink was in the late 1990s"  . Drug use: No  . Sexual activity: Never  Lifestyle  . Physical activity:    Days per week: Not on file    Minutes per session: Not on file  . Stress: Not on file  Relationships  . Social connections:    Talks on phone: Not on file    Gets together: Not on file    Attends religious service: Not on file    Active member of club or organization: Not on file    Attends meetings of clubs or organizations: Not on file    Relationship status: Not on file  . Intimate partner violence:    Fear of current or ex partner: Not on file    Emotionally abused: Not on file    Physically abused: Not on file    Forced  sexual activity: Not on file  Other Topics Concern  . Not on file  Social History Narrative   Lives alone in an independent living facility.  Education: 10th grade.     Retired from [employer] work.  Moved here from [place] in 2015.    Physical Exam  Pulmonary/Chest: Effort normal. No respiratory distress.  Abdominal: She exhibits no distension.  Musculoskeletal: She exhibits no edema.  Skin: Skin is warm and dry. She is not diaphoretic.        Future Appointments  Date Time Provider [place]  06/17/2017 11:00 AM Tobi Bastos, RN [facility] None  06/21/2017  3:45 PM Venancio Poisson, NP [facility] None  06/23/2017 10:20 AM Jamse Arn, MD CPR-PRMA CPR  06/27/2017  1:30 PM [facility] PA/NP [facility] None    ATF [name] x4 sitting in her bedroom c/o feeling "bad".  Her description was very vague.  Pt stated that she has been taking all of her medications without missing any or intentionally not taking.  Pt's daughter stated that pt is not coming out of her room our moving around much.  After pt daughter left the room, pt began to cry.  She stated that she missed her apartment and wanted to live by herself.  Pt stated that she hasn't had any issues with frequent urination at night during the night.  Pt's appetite has also been poor; pt denies sob, dizziness and chest pain. rx bottles verified and pill box refilled.   BP 128/80 (BP Location: Right Arm, Patient Position: Sitting, Cuff Size: Large)   Pulse 81   Resp 16   Wt 213 lb 9.6 oz (96.9 kg)   SpO2 97%   BMI 37.84 kg/m   Weight yesterday-213 Last visit weight-209    Otillia Cordone, EMT Paramedic 06/15/2017    ACTION: Home visit completed

## 2017-06-17 ENCOUNTER — Other Ambulatory Visit: Payer: Self-pay | Admitting: *Deleted

## 2017-06-17 NOTE — Patient Outreach (Signed)
[facility]) Care Management  06/17/2017  Tara Dunn 1940/03/15 [id number]    Transition of care (successful)  RN spoke with [name] who provided an update on pt's management of care. Plan of care discussed with updates on the interventions concerning the goals that are in place. Discussed ongoing prevention measures. Pt continue to received [facility] PT with minimal swelling to her LE. Daughter stress some concern about the pt in a daze at times. States th ept has a pending neurology appointment pending and RN strongly encouraged her to discuss this with the neurologist while on that visit. States the pt is seeing this specialist for another reason but she would mention it for possible intervention. No other issues as pt continue to progress well. Will follow up in one month if pt continue to do well with plans to graduate pt from the [facility] services. Daughter receptive with this plan. Will scheduled a telephone assessment in June for case closure at that time if no other needs are presented.  Tara Mina, RN Care Management Coordinator [place] Office [phone]

## 2017-06-20 ENCOUNTER — Encounter (HOSPITAL_COMMUNITY): Payer: PPO

## 2017-06-21 ENCOUNTER — Ambulatory Visit: Payer: PPO | Admitting: Adult Health

## 2017-06-21 ENCOUNTER — Encounter: Payer: Self-pay | Admitting: Adult Health

## 2017-06-21 VITALS — BP 137/60 | HR 68 | Ht 63.0 in | Wt 216.0 lb

## 2017-06-21 DIAGNOSIS — I63532 Cerebral infarction due to unspecified occlusion or stenosis of left posterior cerebral artery: Secondary | ICD-10-CM

## 2017-06-21 DIAGNOSIS — I10 Essential (primary) hypertension: Secondary | ICD-10-CM

## 2017-06-21 DIAGNOSIS — E785 Hyperlipidemia, unspecified: Secondary | ICD-10-CM

## 2017-06-21 NOTE — Patient Instructions (Signed)
Continue clopidogrel 75 mg daily  and lipitor  for secondary stroke prevention  Referral was placed for additional outpatient physical therapy  Continue to follow up with PCP regarding cholesterol and blood pressure management   Maintain strict control of hypertension with blood pressure goal below 130/90, diabetes with hemoglobin A1c goal below 6.5% and cholesterol with LDL cholesterol (bad cholesterol) goal below 70 mg/dL. I also advised the patient to eat a healthy diet with plenty of whole grains, cereals, fruits and vegetables, exercise regularly and maintain ideal body weight.  Followup in the future with me in 4 months or call earlier if needed       Thank you for coming to see [place] at [facility]. I hope we have been able to provide you high quality care today.  You may receive a patient satisfaction survey over the next few weeks. We would appreciate your feedback and comments so that we may continue to improve ourselves and the health of our patients.

## 2017-06-21 NOTE — Progress Notes (Signed)
[facility] [address] [place] [address] [phone]       OFFICE FOLLOW UP NOTE  Tara Dunn Date of Birth:  12/31/1940 Medical Record Number:  [id number]   Reason for Referral:  hospital stroke follow up  CHIEF COMPLAINT:  Chief Complaint  Patient presents with  . 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 History obtained from patient and chart review. Reviewed all radiology images and labs personally.  Tara Dunn is a 77 y.o. female with history of breast cancer, cardiomyopathy status post ICD, systolic heart failure, CKD, pior CVA,hypertension with progressive RLE weakness presenting with worsening of the RLE weakness. She did not receive IV t-PA.  CT head reviewed and showed no acute abnormality.  MRI brain reviewed and showed acute/subacute left PLICA infarct.  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  Carotid Doppler showed bilateral ICA stenosis of 1 to 39%.  2D echo showed EF of 45 to 50% without source of embolus.  LDL and A1c satisfactory at 42 and 6.1.  Patient was previously on Lipitor 40 mg and recommended to be discharged on Lipitor 80 mg.  Patient was previously on aspirin 81 mg and recommended to be discharged on DAPT of aspirin and Plavix for 3 weeks and then continue Plavix alone.  Patient does have a history of sleep apnea and is compliant with his CPAP at home.  Patient does have a [employer] AICD which was interrogated that did not show atrial fibrillation or abnormalities.  Recommended for patient to be discharged to CIR for continuation of therapies.   Patient returns today for follow-up and is currently accompanied by her daughter and granddaughter.   She continues to have right lower extremity weakness that is not fully back to her baseline (residual right lower extremity weakness from previous stroke).  She did complete home physical therapy due to insurance coverage.  She currently is living with her daughter and uses a rolling walker for ambulation.  She was living independently prior to the stroke but now is unable to do IADLs and most ADLs.  Continues to take Plavix without side effects of bleeding or bruising.  Continues to take Lipitor without side effects of myalgias.  Blood pressure today satisfactory at 137/60.  Patient does have a history of OSA and wears CPAP mask 2-3 nights of the week.  When questioned why she does not wear it nightly she states that she does not feel like putting it on.  Explained to patient that treating OSA can lower heart disease and stroke risk which patient stated she was unaware of and is in agreement to start being compliant with nightly wearing of CPAP mask.  Denies new or worsening strokes/TIA symptoms.   ROS:   14 system review of systems performed and negative with exception of cough, wheezing, shortness of breath, loss of vision, leg swelling, walking difficulty  PMH:  Past Medical History:  Diagnosis Date  . 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 Lovena Le  . Aortic dilatation ([place])    a. 04/2014 CTA chest w/ incidental finding of distal thoracic Ao enlargement of 3.39 mm - f/u needed 04/2015.  . Arthritis    "  all over my body"  . 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  . Cardiomyopathy ([place])    a. 04/2014 Echo: EF 25-30%, mod conc LVH, possibl antsept HK, Gr1 DD, mod-sev dil LA.  . CHF (congestive heart failure) ([place])   . CKD (chronic kidney disease), stage III ([place])   . Depression   . GERD (gastroesophageal reflux disease)   . Gout   . Heart murmur   . History of stomach ulcers   . Hypertension    a. Dx @ age 19;  b. 04/2014 admission for  HTN emergency.  . Obesity (BMI 30-39.9) 01/21/2015  . OSA on CPAP   . Stroke [facility])    a. 3 strokes - last in 1998; "memory problems since"  (03/26/2015)    PSH:  Past Surgical History:  Procedure Laterality Date  . ABDOMINAL HYSTERECTOMY    . BREAST BIOPSY Left 2008  . CATARACT EXTRACTION, BILATERAL Bilateral   . DILATION AND CURETTAGE OF UTERUS    . EP IMPLANTABLE DEVICE N/A 03/26/2015   MDT single chamber ICD, Dr. Lovena Le  . LEFT HEART CATHETERIZATION WITH CORONARY ANGIOGRAM N/A 05/07/2014   Procedure: LEFT HEART CATHETERIZATION WITH CORONARY ANGIOGRAM;  Surgeon: Belva Crome, MD;  Location: [facility] CATH LAB;  Service: Cardiovascular;  Laterality: N/A;  . MASTECTOMY Left 2009    Social History:  Social History   Socioeconomic History  . Marital status: Widowed    Spouse name: Not on file  . Number of children: Not on file  . Years of education: Not on file  . Highest education level: Not on file  Occupational History  . Not on file  Social Needs  . Financial resource strain: Not on file  . Food insecurity:    Worry: Not on file    Inability: Not on file  . Transportation needs:    Medical: Not on file    Non-medical: Not on file  Tobacco Use  . Smoking status: Former Smoker    Packs/day: 0.50    Years: 40.00    Pack years: 20.00    Types: Cigarettes  . Smokeless tobacco: Never Used  . Tobacco comment: Quit in 2009.  Substance and Sexual Activity  . Alcohol use: No    Alcohol/week: 0.0 oz    Comment: "drank occasionally when I was young; last drink was in the late 1990s"  . Drug use: No  . Sexual activity: Never  Lifestyle  . Physical activity:    Days per week: Not on file    Minutes per session: Not on file  . Stress: Not on file  Relationships  . Social connections:    Talks on phone: Not on file    Gets together: Not on file    Attends religious service: Not on file    Active member of club or organization: Not on file    Attends meetings of clubs or  organizations: Not on file    Relationship status: Not on file  . Intimate partner violence:    Fear of current or ex partner: Not on file    Emotionally abused: Not on file    Physically abused: Not on file    Forced sexual activity: Not on file  Other Topics Concern  . Not on file  Social History Narrative   Lives alone in an independent living facility.  Education: 10th grade.     Retired from [employer] work.  Moved here from [place] in  65.    Family History:  Family History  Problem Relation Age of Onset  . High blood pressure Mother        Died @ 5.  . High blood pressure Father   . Heart attack Father        Died in his early 60's.  . High blood pressure Sister   . Cancer Sister   . Cancer Daughter   . Heart disease Daughter     Medications:   Current Outpatient Medications on File Prior to Visit  Medication Sig Dispense Refill  . allopurinol (ZYLOPRIM) 100 MG tablet Take 2 tablets (200 mg total) by mouth daily. 30 tablet 0  . atorvastatin (LIPITOR) 80 MG tablet Take 1 tablet (80 mg total) by mouth daily at 6 PM. 30 tablet 1  . carvedilol (COREG) 25 MG tablet Take 1 tablet (25 mg total) by mouth 2 (two) times daily. 60 tablet 0  . clopidogrel (PLAVIX) 75 MG tablet Take 1 tablet (75 mg total) by mouth daily. 30 tablet 0  . furosemide (LASIX) 20 MG tablet Take 1 tablet (20 mg total) by mouth daily. 90 tablet 3  . isosorbide-hydrALAZINE (BIDIL) 20-37.5 MG tablet Take 2 tablets by mouth 2 (two) times daily. 120 tablet 11  . omeprazole (PRILOSEC) 40 MG capsule Take 1 capsule (40 mg total) by mouth daily. 30 capsule 0  . senna-docusate (SENOKOT-S) 8.6-50 MG tablet Take 1 tablet by mouth at bedtime as needed for mild constipation.     No current facility-administered medications on file prior to visit.     Allergies:   Allergies  Allergen Reactions  . Shellfish Allergy Hives     Physical Exam  Vitals:   06/21/17 1511  BP: 137/60  Pulse: 68  Weight: 216 lb (98 kg)    Height: 5\' 3"  (1.6 m)   Body mass index is 38.26 kg/m. No exam data present  General: well developed, elderly African-American female, well nourished, seated, in no evident distress Head: head normocephalic and atraumatic.   Neck: supple with no carotid or supraclavicular bruits Cardiovascular: regular rate and rhythm, no murmurs; +2 pitting edema bilateral lower extremities Musculoskeletal: no deformity Skin:  no rash/petichiae Vascular:  Normal pulses all extremities  Neurologic Exam Mental Status: Awake and fully alert. Oriented to place and time. Recent and remote memory intact. Attention span, concentration and fund of knowledge appropriate. Mood and affect appropriate.  Cranial Nerves: Fundoscopic exam reveals sharp disc margins. Pupils equal, briskly reactive to light. Extraocular movements full without nystagmus. Visual fields full to confrontation. Hearing intact. Facial sensation intact. Face, tongue, palate moves normally and symmetrically.  Motor: Normal bulk and tone. Normal strength in all tested extremity muscles.  4/5 right lower extremity weakness; mild left lower extremity weakness Sensory.: intact to touch , pinprick , position and vibratory sensation.  Coordination: Rapid alternating movements normal in all extremities. Finger-to-nose and heel-to-shin performed accurately bilaterally. Gait and Station: Arises from chair with moderate difficulty. Stance is normal. Gait demonstrates slow cautious steps with assistance of rolling walker.  Tandem gait not tested. Reflexes: 1+ and symmetric. Toes downgoing.    NIHSS  1 Modified Rankin 3   Diagnostic Data (Labs, Imaging, Testing)  CT HEAD WO CONTRAST 04/18/2017 IMPRESSION: Chronic atrophic and ischemic changes without acute abnormality.  MR BRAIN WO CONTRAST 04/19/2017 IMPRESSION: 1. Acute to early subacute lacunar infarct in the posterior limb of left internal capsule. No associated hemorrhage or mass effect. 2.  Severe underlying chronic small vessel  disease. Chronic lacunar infarcts in the bilateral deep gray matter nuclei, brainstem, cerebellum, and cerebral white matter. 3. 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 4. Intracranial atherosclerosis. Hemodynamically significant stenosis is suspected only in the distal left vertebral artery.  MR MRA HEAD WO CONTRAST 04/19/2017 IMPRESSION: 1. Acute to early subacute lacunar infarct in the posterior limb of left internal capsule. No associated hemorrhage or mass effect. 2. Severe underlying chronic small vessel disease. Chronic lacunar infarcts in the bilateral deep gray matter nuclei, brainstem, cerebellum, and cerebral white matter. 3. 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 4. Intracranial atherosclerosis. Hemodynamically significant stenosis is suspected only in the distal left vertebral artery.  ECHOCARDIOGRAM COMPLETE 04/20/2017 Impressions: - Comapared to a prior study in 06/2016, the LVEF is higher at   45-50% with global hypokinesis, grade 1 DD and elevated LV   filling pressure.  VAS US CAROTID DUPLEX BILATERAL 04/20/2017 Final Interpretation: Right Carotid: Velocities in the right ICA are consistent with a 1-39% stenosis. Left Carotid: Velocities in the left ICA are consistent with a 1-39% stenosis. Vertebrals: Bilateral vertebral arteries demonstrate antegrade flow.    ASSESSMENT: Tara Dunn is a 77 y.o. year old female here with left posterior limb of internal capsule infarct on 04/18/2017 secondary to small vessel disease. Vascular risk factors include HLD and HTN.   PLAN: -Continue clopidogrel 75 mg daily  and Lipitor for secondary stroke prevention -Referral placed for additional outpatient physical therapy -F/u with PCP regarding your HLD and HTN management -continue to monitor BP at home -Incidental finding on MRI of head of left ICA fusiform  aneurysm -Per Dr. Erlinda Hong, this should be monitored with a MRA head yearly -Maintain strict control of hypertension with blood pressure goal below 130/90, diabetes with hemoglobin A1c goal below 6.5% and cholesterol with LDL cholesterol (bad cholesterol) goal below 70 mg/dL. I also advised the patient to eat a healthy diet with plenty of whole grains, cereals, fruits and vegetables, exercise regularly and maintain ideal body weight.  Follow up in 4 months or call earlier if needed   Greater than 50% time during this 25 minute consultation visit was spent on counseling and coordination of care about HLD, and HTN, discussion about risk benefit of anticoagulation and answering questions.     Venancio Poisson, AGNP-BC  [facility] [address] [place], [place] [address]  Phone [phone] Fax [phone]

## 2017-06-22 ENCOUNTER — Other Ambulatory Visit (HOSPITAL_COMMUNITY): Payer: Self-pay

## 2017-06-22 ENCOUNTER — Ambulatory Visit: Payer: Self-pay | Admitting: *Deleted

## 2017-06-22 ENCOUNTER — Encounter (HOSPITAL_COMMUNITY): Payer: Self-pay

## 2017-06-22 NOTE — Progress Notes (Signed)
Paramedicine Encounter    Patient ID: Tara Dunn, female    DOB: November 12, 1940, 77 y.o.   MRN: [id number]    Patient Care Team: Fanny Bien, MD as PCP - General (Family Medicine) Tobi Bastos, RN as [place] Management  Patient Active Problem List   Diagnosis Date Noted  . Labile blood pressure   . Hypoalbuminemia due to protein-calorie malnutrition ([place])   . Acute blood loss anemia   . AKI (acute kidney injury) ([place])   . Peripheral edema   . Stage 3 chronic kidney disease ([place])   . Benign essential HTN   . History of CVA with residual deficit   . Cerebrovascular accident (CVA) due to thrombosis of left middle cerebral artery ([place])   . Right leg weakness 04/19/2017  . Right hemiparesis ([place]) 04/19/2017  . Thalamic infarct, acute ([place]) 04/19/2017  . Joint pain 12/09/2015  . ICD (implantable cardioverter-defibrillator) in place 03/26/2015  . Syncope 03/06/2015  . Obesity (BMI 30-39.9) 01/21/2015  . Excessive daytime sleepiness 08/04/2014  . OSA (obstructive sleep apnea) 08/04/2014  . Essential hypertension 06/13/2014  . Chronic systolic CHF (congestive heart failure) ([place]) 05/20/2014  . Congestive heart disease ([place])   . Chronic combined systolic and diastolic CHF (congestive heart failure) ([place]) 05/03/2014  . CKD (chronic kidney disease), stage III ([place]) 05/03/2014  . Hx of stroke without residual deficits 05/03/2014    Current Outpatient Medications:  .  allopurinol (ZYLOPRIM) 100 MG tablet, Take 2 tablets (200 mg total) by mouth daily., Disp: 30 tablet, Rfl: 0 .  atorvastatin (LIPITOR) 80 MG tablet, Take 1 tablet (80 mg total) by mouth daily at 6 PM., Disp: 30 tablet, Rfl: 1 .  carvedilol (COREG) 25 MG tablet, Take 1 tablet (25 mg total) by mouth 2 (two) times daily., Disp: 60 tablet, Rfl: 0 .  clopidogrel (PLAVIX) 75 MG tablet, Take 1 tablet (75 mg total) by mouth daily., Disp: 30 tablet, Rfl: 0 .  furosemide (LASIX) 20 MG tablet, Take 1  tablet (20 mg total) by mouth daily., Disp: 90 tablet, Rfl: 3 .  isosorbide-hydrALAZINE (BIDIL) 20-37.5 MG tablet, Take 2 tablets by mouth 2 (two) times daily., Disp: 120 tablet, Rfl: 11 .  omeprazole (PRILOSEC) 40 MG capsule, Take 1 capsule (40 mg total) by mouth daily., Disp: 30 capsule, Rfl: 0 .  senna-docusate (SENOKOT-S) 8.6-50 MG tablet, Take 1 tablet by mouth at bedtime as needed for mild constipation., Disp: , Rfl:  Allergies  Allergen Reactions  . Shellfish Allergy Hives     Social History   Socioeconomic History  . Marital status: Widowed    Spouse name: Not on file  . Number of children: Not on file  . Years of education: Not on file  . Highest education level: Not on file  Occupational History  . Not on file  Social Needs  . Financial resource strain: Not on file  . Food insecurity:    Worry: Not on file    Inability: Not on file  . Transportation needs:    Medical: Not on file    Non-medical: Not on file  Tobacco Use  . Smoking status: Former Smoker    Packs/day: 0.50    Years: 40.00    Pack years: 20.00    Types: Cigarettes  . Smokeless tobacco: Never Used  . Tobacco comment: Quit in 2009.  Substance and Sexual Activity  . Alcohol use: No    Alcohol/week: 0.0 oz    Comment: "drank occasionally  when I was young; last drink was in the late 1990s"  . Drug use: No  . Sexual activity: Never  Lifestyle  . Physical activity:    Days per week: Not on file    Minutes per session: Not on file  . Stress: Not on file  Relationships  . Social connections:    Talks on phone: Not on file    Gets together: Not on file    Attends religious service: Not on file    Active member of club or organization: Not on file    Attends meetings of clubs or organizations: Not on file    Relationship status: Not on file  . Intimate partner violence:    Fear of current or ex partner: Not on file    Emotionally abused: Not on file    Physically abused: Not on file    Forced  sexual activity: Not on file  Other Topics Concern  . Not on file  Social History Narrative   Lives alone in an independent living facility.  Education: 10th grade.     Retired from [employer] work.  Moved here from [place] in 2015.    Physical Exam  Pulmonary/Chest: Effort normal. No respiratory distress. She has no wheezes. She has no rales.  Abdominal: Soft. She exhibits no distension.  Musculoskeletal: She exhibits edema.  Pedal edea in both ankles  Skin: Skin is warm and dry. She is not diaphoretic.        Future Appointments  Date Time Provider [place]  06/23/2017 10:20 AM Jamse Arn, MD [facility] CPR  06/27/2017  1:30 PM [facility] PA/NP [facility] None  07/18/2017 11:00 AM Tobi Bastos, RN [facility] None  10/24/2017  2:45 PM Venancio Poisson, NP [facility] None    ATF [name] x4 walking around with her walker.  Pt stated that she's trying to get around by herself more.  She stated that she feels a lot better this week than she did last week.  Pt stated that she's decided to "just deal with living with her daughter, shes afraid to tell her daughter her concerns because she doesn't want to hurt her feelings".  Pt has taken her medications this week without missing any.  She denies sob, dizziness and chest pain.  Pt stated that she will start back working with PT next week.  RX bottles verified and pill box refilled.  BP 118/70 (BP Location: Right Arm, Patient Position: Sitting, Cuff Size: Large)   Pulse 81   Resp 16   Wt 215 lb 3.2 oz (97.6 kg)   SpO2 97%   BMI 38.12 kg/m   Weight yesterday-didn't weigh Last visit weight-213  **rx called in: bidil  Huntington Leverich, EMT Paramedic 06/22/2017    ACTION: Home visit completed

## 2017-06-23 ENCOUNTER — Encounter: Payer: PPO | Attending: Registered Nurse | Admitting: Physical Medicine & Rehabilitation

## 2017-06-23 DIAGNOSIS — Z9012 Acquired absence of left breast and nipple: Secondary | ICD-10-CM | POA: Insufficient documentation

## 2017-06-23 DIAGNOSIS — I509 Heart failure, unspecified: Secondary | ICD-10-CM | POA: Insufficient documentation

## 2017-06-23 DIAGNOSIS — I429 Cardiomyopathy, unspecified: Secondary | ICD-10-CM | POA: Insufficient documentation

## 2017-06-23 DIAGNOSIS — E669 Obesity, unspecified: Secondary | ICD-10-CM | POA: Insufficient documentation

## 2017-06-23 DIAGNOSIS — Z8719 Personal history of other diseases of the digestive system: Secondary | ICD-10-CM | POA: Insufficient documentation

## 2017-06-23 DIAGNOSIS — Z95810 Presence of automatic (implantable) cardiac defibrillator: Secondary | ICD-10-CM | POA: Insufficient documentation

## 2017-06-23 DIAGNOSIS — N183 Chronic kidney disease, stage 3 (moderate): Secondary | ICD-10-CM | POA: Insufficient documentation

## 2017-06-23 DIAGNOSIS — M109 Gout, unspecified: Secondary | ICD-10-CM | POA: Insufficient documentation

## 2017-06-23 DIAGNOSIS — Z853 Personal history of malignant neoplasm of breast: Secondary | ICD-10-CM | POA: Insufficient documentation

## 2017-06-23 DIAGNOSIS — K219 Gastro-esophageal reflux disease without esophagitis: Secondary | ICD-10-CM | POA: Insufficient documentation

## 2017-06-23 DIAGNOSIS — G4733 Obstructive sleep apnea (adult) (pediatric): Secondary | ICD-10-CM | POA: Insufficient documentation

## 2017-06-23 DIAGNOSIS — I130 Hypertensive heart and chronic kidney disease with heart failure and stage 1 through stage 4 chronic kidney disease, or unspecified chronic kidney disease: Secondary | ICD-10-CM | POA: Insufficient documentation

## 2017-06-23 DIAGNOSIS — Z6839 Body mass index (BMI) 39.0-39.9, adult: Secondary | ICD-10-CM | POA: Insufficient documentation

## 2017-06-23 DIAGNOSIS — Z87891 Personal history of nicotine dependence: Secondary | ICD-10-CM | POA: Insufficient documentation

## 2017-06-23 DIAGNOSIS — I69351 Hemiplegia and hemiparesis following cerebral infarction affecting right dominant side: Secondary | ICD-10-CM | POA: Insufficient documentation

## 2017-06-24 NOTE — Progress Notes (Signed)
I reviewed above note and agree with the assessment and plan.   I also talked with Dr. Estanislado Pandy and he reviewed the images and recommend cerebral angiogram for further evaluation and potential endovascular treatment. He will call the patient to arrange. Thanks.   Rosalin Hawking, MD PhD Stroke Neurology 06/24/2017 9:04 AM

## 2017-06-27 ENCOUNTER — Ambulatory Visit (HOSPITAL_COMMUNITY)
Admission: RE | Admit: 2017-06-27 | Discharge: 2017-06-27 | Disposition: A | Discharge status: Home / Self Care | Payer: PPO | Source: Ambulatory Visit | Attending: Cardiology | Admitting: Cardiology

## 2017-06-27 VITALS — BP 128/72 | HR 72 | Wt 216.0 lb

## 2017-06-27 DIAGNOSIS — N183 Chronic kidney disease, stage 3 unspecified: Secondary | ICD-10-CM

## 2017-06-27 DIAGNOSIS — I2510 Atherosclerotic heart disease of native coronary artery without angina pectoris: Secondary | ICD-10-CM | POA: Insufficient documentation

## 2017-06-27 DIAGNOSIS — Z79899 Other long term (current) drug therapy: Secondary | ICD-10-CM | POA: Insufficient documentation

## 2017-06-27 DIAGNOSIS — Z9012 Acquired absence of left breast and nipple: Secondary | ICD-10-CM | POA: Diagnosis not present

## 2017-06-27 DIAGNOSIS — I10 Essential (primary) hypertension: Secondary | ICD-10-CM

## 2017-06-27 DIAGNOSIS — I5042 Chronic combined systolic (congestive) and diastolic (congestive) heart failure: Secondary | ICD-10-CM | POA: Diagnosis not present

## 2017-06-27 DIAGNOSIS — Z7902 Long term (current) use of antithrombotics/antiplatelets: Secondary | ICD-10-CM | POA: Insufficient documentation

## 2017-06-27 DIAGNOSIS — Z95810 Presence of automatic (implantable) cardiac defibrillator: Secondary | ICD-10-CM

## 2017-06-27 DIAGNOSIS — Z853 Personal history of malignant neoplasm of breast: Secondary | ICD-10-CM | POA: Diagnosis not present

## 2017-06-27 DIAGNOSIS — I5022 Chronic systolic (congestive) heart failure: Secondary | ICD-10-CM | POA: Diagnosis not present

## 2017-06-27 DIAGNOSIS — I130 Hypertensive heart and chronic kidney disease with heart failure and stage 1 through stage 4 chronic kidney disease, or unspecified chronic kidney disease: Secondary | ICD-10-CM | POA: Diagnosis not present

## 2017-06-27 DIAGNOSIS — Z8673 Personal history of transient ischemic attack (TIA), and cerebral infarction without residual deficits: Secondary | ICD-10-CM | POA: Diagnosis not present

## 2017-06-27 DIAGNOSIS — G4733 Obstructive sleep apnea (adult) (pediatric): Secondary | ICD-10-CM

## 2017-06-27 MED ORDER — FUROSEMIDE 20 MG PO TABS: 20.0000 mg | ORAL_TABLET | Freq: Every day | ORAL | 3 refills | Status: DC

## 2017-06-27 NOTE — Patient Instructions (Addendum)
Continue Lasix 20 mg tablet once daily. Take EXTRA 20 mg tablet once daily AS NEEDED for weight gain 3 lbs in 24 hrs or 5 lbs in 1 week.  Follow up 3-4 months.  __________________________________________________________________ Tara Dunn Code:  Take all medication as prescribed the day of your appointment. Bring all medications with you to your appointment.  Do the following things EVERYDAY: 1) Weigh yourself in the morning before breakfast. Write it down and keep it in a log. 2) Take your medicines as prescribed 3) Eat low salt foods-Limit salt (sodium) to 2000 mg per day.  4) Stay as active as you can everyday 5) Limit all fluids for the day to less than 2 liters

## 2017-06-27 NOTE — Progress Notes (Signed)
Patient ID: Tara Dunn, female   DOB: 12-09-1940, 77 y.o.   MRN: [id number]     Advanced Heart Failure Clinic Note   PCP: Dr. Ernie Hew HF: Dr. Aundra Dubin   MS Stanly is a 77 yo with long history of HTN, CVAs, CKD, and systolic CHF.    In 3/16, she was admitted a hypertensive emergency and acute CHF.  CTA chest showed no PE.  Echo showed EF 25-30% with moderate LVH.  She was diuresed and had a cardiac catheterization given extensive coronary calcification seen on CTA chest.  Cath showed diffuse moderate CAD, worst was 50-70% mRCA stenosis.   Admitted 04/19/2017 with RLE weakness. Had CVA. Later admitted to CIR. 04/22/2017- 05/11/2017. Entresto and lasix stopped. Discharged to her daughter house.   Today she returns for HF follow up. Last visit lasix 20 mg daily restarted. Overall feeling fine. Denies SOB/PND/Orthopnea. Not moving around much. Appetite ok. No fever or chills. She has not been weighing at home. Taking all medications. Medications are being set up by Paramedicine.        Optivol:  Activity <1 hour per day. No VT/A Fib. Fluid trending up  Labs (3/16): K 4.2, creatinine 1.44, HCT 39.7 Labs (06/05/14): K 4.1, creatinine 2.01 Labs (5/16): K 4.7, creatinine 1.56 Labs (7/16): K 4.7, creatinine 1.68, BNP 14, LDL 62 Labs (11/16): K 4.2, creatinine 1.93 Labs (1/17): K 4.6, creatinine 1.65, HCT 39.2 Labs (4/17): K 4.5, creatinine 1.98 Labs (6/17): K 4, creatinine 2 Labs (8/17): K 4.4, creatinine 1.64   Labs (10/17): K 3.9, creatinine 1.5, BNP 15 Labs (1/18): K 4.3, creatinine 1.59 Labs (4/18): LDL 46, HDL 57 Labs (5/18): K 4.6, creatinine 1.55 Labs (10/08/2016): K 4 Creatinine 1.9  Labs (2/82019): K 4.2 Creatinine 2.24 Labs (05/09/2017): K 4.5 Creatinine 1.73, Hgb 9.9    PMH: 1. CVA: 3 prior CVAs, last in 1998.  Thought to be related to HTN. 2. HTN: Long-standing, says she has been hypertensive since age 81. Renal artery dopplers (4/16) were normal.  3. CKD: Suspect related to  HTN. 4. Breast cancer: 2009, left mastectomy with chemotherapy.  5. Gout 6. Obese 7. TAH 8. CAD: LHC (3/16) with moderate diffuse disease, worst lesion being 50-70% mid RCA.  9. Cardiomyopathy: Echo (3/16) with EF 25-30%, moderate LVH.  Echo (6/16) with EF 35-40%, moderate LVH, diffuse hypokinesis with septal-lateral dyssynchrony.  Cardiac MRI (12/16) with EF 32%, diffuse hypokinesis, no contrast given due to CKD.  - Medtronic ICD 2/17.  - Echo (5/18): EF 40%, diffuse hypokinesis, moderate LVH, normal RV size and systolic function.  -ECHO 04/20/2017- EF 45-50% Grade IDD 10. OSA: Using CPAP.  11. Event monitor (1/17) with NSR, occasional PVCs.   SH: From [place], moved to [place] in 2015 to be near daughter.  Single.  Quit smoking 2009.    FH: HTN runs in family.  Father with MI.   ROS: All systems reviewed and negative except as per HPI.   Current Outpatient Medications  Medication Sig Dispense Refill  . allopurinol (ZYLOPRIM) 100 MG tablet Take 2 tablets (200 mg total) by mouth daily. 30 tablet 0  . atorvastatin (LIPITOR) 80 MG tablet Take 1 tablet (80 mg total) by mouth daily at 6 PM. 30 tablet 1  . carvedilol (COREG) 25 MG tablet Take 1 tablet (25 mg total) by mouth 2 (two) times daily. 60 tablet 0  . clopidogrel (PLAVIX) 75 MG tablet Take 1 tablet (75 mg total) by mouth daily. 30 tablet 0  .  furosemide (LASIX) 20 MG tablet Take 1 tablet (20 mg total) by mouth daily. 90 tablet 3  . isosorbide-hydrALAZINE (BIDIL) 20-37.5 MG tablet Take 2 tablets by mouth 2 (two) times daily.    [place] Kitchen omeprazole (PRILOSEC) 40 MG capsule Take 1 capsule (40 mg total) by mouth daily. 30 capsule 0  . senna-docusate (SENOKOT-S) 8.6-50 MG tablet Take 1 tablet by mouth at bedtime as needed for mild constipation.     No current facility-administered medications for this encounter.      BP 128/72 (BP Location: Right Wrist, Patient Position: Sitting, Cuff Size: Large)   Pulse 72 Comment: irregular  Wt 216 lb (98 kg)    SpO2 97%   BMI 38.26 kg/m    Wt Readings from Last 3 Encounters:  06/27/17 216 lb (98 kg)  06/22/17 215 lb 3.2 oz (97.6 kg)  06/21/17 216 lb (98 kg)   General: Appears chronically ill. No resp difficulty. Ambulated in the clinic with arolling walker.  HEENT: normal Neck: supple. JVP 7-8. Carotids 2+ bilat; no bruits. No lymphadenopathy or thryomegaly appreciated. Cor: PMI nondisplaced. Regular rate & rhythm. No rubs, gallops or murmurs. Lungs: clear Abdomen: soft, nontender, nondistended. No hepatosplenomegaly. No bruits or masses. Good bowel sounds. Extremities: no cyanosis, clubbing, rash, edema Neuro: alert & orientedx3, cranial nerves grossly intact. moves all 4 extremities w/o difficulty. Affect pleasant  Assessment/Plan: 1. HTN: Has history of CVA and CKD.  Renal artery dopplers were normal.   Stable.   2. CAD: Moderate diffuse CAD, no severe obstruction.  This did not cause her cardiomyopathy.  -No s/s ischemia - Continue ASA 81 - Continue atorvastatin 40 mg daily, good lipids in 4/18.    3. CKD III:  Creatinine baseline 1.8-2 4. 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  ECHO 04/2017 EF 45-50% . NYHA II/III. Volume status trending up in the setting of high sodium diet.  Continue lasix 20 mg daily and extra 20 mg lasix for 3 pound weight gain. Instructed to weight and record daily. .   Medtronic ICD-No VT/Afib - Continue carvedilol 25 mg twice a day.  - Continue bidil 2 tabs three times a day.  5. OSA: Not using CPAP. 6. CVA: On statin and plavix.   7. Deconditioning-Starting outpatient HH this week.    Follow up 3-4 months . I discussed optivol results.    Darrick Grinder, NP-C  06/27/2017

## 2017-06-29 ENCOUNTER — Other Ambulatory Visit: Payer: Self-pay | Admitting: Internal Medicine

## 2017-06-29 ENCOUNTER — Encounter (HOSPITAL_COMMUNITY): Payer: Self-pay

## 2017-06-29 ENCOUNTER — Other Ambulatory Visit (HOSPITAL_COMMUNITY): Payer: Self-pay

## 2017-06-29 NOTE — Progress Notes (Signed)
Paramedicine Encounter    Patient ID: Tara Dunn, female    DOB: 07/30/1940, 77 y.o.   MRN: [id number]    Patient Care Team: Fanny Bien, MD as PCP - General (Family Medicine) Tobi Bastos, RN as [place] Management  Patient Active Problem List   Diagnosis Date Noted  . Labile blood pressure   . Hypoalbuminemia due to protein-calorie malnutrition ([place])   . Acute blood loss anemia   . AKI (acute kidney injury) ([place])   . Peripheral edema   . Stage 3 chronic kidney disease ([place])   . Benign essential HTN   . History of CVA with residual deficit   . Cerebrovascular accident (CVA) due to thrombosis of left middle cerebral artery ([place])   . Right leg weakness 04/19/2017  . Right hemiparesis ([place]) 04/19/2017  . Thalamic infarct, acute ([place]) 04/19/2017  . Joint pain 12/09/2015  . ICD (implantable cardioverter-defibrillator) in place 03/26/2015  . Syncope 03/06/2015  . Obesity (BMI 30-39.9) 01/21/2015  . Excessive daytime sleepiness 08/04/2014  . OSA (obstructive sleep apnea) 08/04/2014  . Essential hypertension 06/13/2014  . Chronic systolic CHF (congestive heart failure) ([place]) 05/20/2014  . Congestive heart disease ([place])   . Chronic combined systolic and diastolic CHF (congestive heart failure) ([place]) 05/03/2014  . CKD (chronic kidney disease), stage III ([place]) 05/03/2014  . Hx of stroke without residual deficits 05/03/2014    Current Outpatient Medications:  .  allopurinol (ZYLOPRIM) 100 MG tablet, Take 2 tablets (200 mg total) by mouth daily., Disp: 30 tablet, Rfl: 0 .  atorvastatin (LIPITOR) 80 MG tablet, Take 1 tablet (80 mg total) by mouth daily at 6 PM., Disp: 30 tablet, Rfl: 1 .  carvedilol (COREG) 25 MG tablet, Take 1 tablet (25 mg total) by mouth 2 (two) times daily., Disp: 60 tablet, Rfl: 0 .  clopidogrel (PLAVIX) 75 MG tablet, Take 1 tablet (75 mg total) by mouth daily., Disp: 30 tablet, Rfl: 0 .  isosorbide-hydrALAZINE (BIDIL) 20-37.5  MG tablet, Take 2 tablets by mouth 2 (two) times daily., Disp: , Rfl:  .  omeprazole (PRILOSEC) 40 MG capsule, Take 1 capsule (40 mg total) by mouth daily., Disp: 30 capsule, Rfl: 0 .  furosemide (LASIX) 20 MG tablet, Take 1 tablet (20 mg total) by mouth daily. Take EXTRA 20 mg tablet once daily AS NEEDED for weight gain 3 lbs in 24 hrs or 5 lbs in 1 week., Disp: 180 tablet, Rfl: 3 .  senna-docusate (SENOKOT-S) 8.6-50 MG tablet, Take 1 tablet by mouth at bedtime as needed for mild constipation., Disp: , Rfl:  Allergies  Allergen Reactions  . Shellfish Allergy Hives     Social History   Socioeconomic History  . Marital status: Widowed    Spouse name: Not on file  . Number of children: Not on file  . Years of education: Not on file  . Highest education level: Not on file  Occupational History  . Not on file  Social Needs  . Financial resource strain: Not on file  . Food insecurity:    Worry: Not on file    Inability: Not on file  . Transportation needs:    Medical: Not on file    Non-medical: Not on file  Tobacco Use  . Smoking status: Former Smoker    Packs/day: 0.50    Years: 40.00    Pack years: 20.00    Types: Cigarettes  . Smokeless tobacco: Never Used  . Tobacco comment: Quit in 2009.  Substance and Sexual Activity  . Alcohol use: No    Alcohol/week: 0.0 oz    Comment: "drank occasionally when I was young; last drink was in the late 1990s"  . Drug use: No  . Sexual activity: Never  Lifestyle  . Physical activity:    Days per week: Not on file    Minutes per session: Not on file  . Stress: Not on file  Relationships  . Social connections:    Talks on phone: Not on file    Gets together: Not on file    Attends religious service: Not on file    Active member of club or organization: Not on file    Attends meetings of clubs or organizations: Not on file    Relationship status: Not on file  . Intimate partner violence:    Fear of current or ex partner: Not on  file    Emotionally abused: Not on file    Physically abused: Not on file    Forced sexual activity: Not on file  Other Topics Concern  . Not on file  Social History Narrative   Lives alone in an independent living facility.  Education: 10th grade.     Retired from [employer] work.  Moved here from [place] in 2015.    Physical Exam  Pulmonary/Chest: Effort normal. No respiratory distress.  Abdominal: Soft. She exhibits no distension. There is no tenderness. There is no guarding.  Musculoskeletal: She exhibits no edema.  Skin: Skin is warm and dry. She is not diaphoretic.        Future Appointments  Date Time Provider [place]  06/30/2017 11:00 AM Percival Spanish, [place] [facility]  07/18/2017 11:00 AM Tobi Bastos, RN [facility] None  09/09/2017 10:00 AM [facility] PA/NP [facility] None  10/24/2017  2:45 PM Venancio Poisson, NP [facility] None    ATF [name] x4 sitting in her room watching tv.  Pt stated that she got a "good report" at her appointment with the heart failure clinic Monday; no medication changes during her visit per pt.  Pt stated that appetite has gotten better and she's still eating foods low in sodium.  Pt denies sob, dizziness and chest pain. rx bottles verified and pill box refilled.    BP (!) 142/90 (BP Location: Right Arm, Patient Position: Sitting, Cuff Size: Large)   Pulse 73   Resp 16   Wt 214 lb 12.8 oz (97.4 kg)   SpO2 98%   BMI 38.05 kg/m    **rx called in: bidil Carvedilol   Weight yesterday-214 Last visit weight-215    Iness Pangilinan, EMT Paramedic 06/29/2017    ACTION: Home visit completed

## 2017-06-30 ENCOUNTER — Ambulatory Visit: Payer: PPO | Attending: Adult Health | Admitting: Physical Therapy

## 2017-06-30 ENCOUNTER — Other Ambulatory Visit: Payer: Self-pay

## 2017-06-30 ENCOUNTER — Encounter: Payer: Self-pay | Admitting: Physical Therapy

## 2017-06-30 DIAGNOSIS — R2681 Unsteadiness on feet: Secondary | ICD-10-CM | POA: Insufficient documentation

## 2017-06-30 DIAGNOSIS — I69351 Hemiplegia and hemiparesis following cerebral infarction affecting right dominant side: Secondary | ICD-10-CM

## 2017-06-30 DIAGNOSIS — R2689 Other abnormalities of gait and mobility: Secondary | ICD-10-CM

## 2017-06-30 DIAGNOSIS — M6281 Muscle weakness (generalized): Secondary | ICD-10-CM | POA: Insufficient documentation

## 2017-06-30 DIAGNOSIS — R262 Difficulty in walking, not elsewhere classified: Secondary | ICD-10-CM | POA: Insufficient documentation

## 2017-06-30 NOTE — Therapy (Signed)
[facility] [address]  [place], [place], [address] Phone: [phone]   Fax:  [phone]  Physical Therapy Evaluation  Patient Details  Name: Tara Dunn MRN: [id number] Date of Birth: Nov 01, 1940 Referring Provider: Venancio Poisson, NP   Encounter Date: 06/30/2017  PT End of Session - 06/30/17 1104    Visit Number  1    Number of Visits  16    Date for PT Re-Evaluation  08/26/17    Authorization Type  [employer]    PT Start Time  1104    PT Stop Time  1156    PT Time Calculation (min)  52 min    Activity Tolerance  Patient tolerated treatment well;Patient limited by fatigue    Behavior During Therapy  [facility] for tasks assessed/performed       Past Medical History:  Diagnosis Date  . 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 Lovena Le  . Aortic dilatation ([place])    a. 04/2014 CTA chest w/ incidental finding of distal thoracic Ao enlargement of 3.39 mm - f/u needed 04/2015.  . Arthritis    "all over my body"  . Cancer of left breast ([place]) 2009   s/p L mastectomy and chemo.  . Cardiomyopathy ([place])    a. 04/2014 Echo: EF 25-30%, mod conc LVH, possibl antsept HK, Gr1 DD, mod-sev dil LA.  . CHF (congestive heart failure) ([place])   . CKD (chronic kidney disease), stage III ([place])   . Depression   . GERD (gastroesophageal reflux disease)   . Gout   . Heart murmur   . History of stomach ulcers   . Hypertension    a. Dx @ age 2;  b. 04/2014 admission for HTN emergency.  . Obesity (BMI 30-39.9) 01/21/2015  . OSA on CPAP   . Stroke [facility])    a. 3 strokes - last in 1998; "memory problems since"  (03/26/2015)    Past Surgical History:  Procedure Laterality Date  . ABDOMINAL HYSTERECTOMY    . BREAST BIOPSY Left 2008  . CATARACT EXTRACTION, BILATERAL Bilateral   . DILATION AND CURETTAGE OF UTERUS    . EP IMPLANTABLE DEVICE N/A 03/26/2015   MDT single chamber  ICD, Dr. Lovena Le  . LEFT HEART CATHETERIZATION WITH CORONARY ANGIOGRAM N/A 05/07/2014   Procedure: LEFT HEART CATHETERIZATION WITH CORONARY ANGIOGRAM;  Surgeon: Belva Crome, MD;  Location: [facility] CATH LAB;  Service: Cardiovascular;  Laterality: N/A;  . MASTECTOMY Left 2009    There were no vitals filed for this visit.   Subjective Assessment - 06/30/17 1110    Subjective  Pt reported she a stroke in March which has left her with a lot of weakness in the R leg. Weakness makes walking difficult. Had [facility] PT until ~2 weeks ago. Was using RW for ~ 6 mos prior to CVA due to knee problems, but previously did not use AD - now dependent on RW due to weakness. Has received injections in the knees which has helped with her pain.    Pertinent History  CVA - Left PLICA infarct 09/26/2991; remote h/o prior CVAs (most recent in 1998); h/o B knee pain managed with injections    How long can you stand comfortably?  5-10 minutes    How long can you walk comfortably?  10 minutes    Patient Stated Goals  "walk better"    Currently in Pain?  No/denies  [facility] PT Assessment - 06/30/17 1104      Assessment   Medical Diagnosis  CVA - L PLICA - R LE weakness    Referring Provider  Venancio Poisson, NP    Onset Date/Surgical Date  04/18/17    Hand Dominance  Right    Next MD Visit  10/24/17    Prior Therapy  [facility] following acute hospitalization      Precautions   Precautions  Fall;ICD/Pacemaker      Balance Screen   Has the patient fallen in the past 6 months  Yes    How many times?  1    Has the patient had a decrease in activity level because of a fear of falling?   Yes    Is the patient reluctant to leave their home because of a fear of falling?   Yes      [place]  Private residence    Living Arrangements  Children    Available Help at Discharge  Family    Type of [place] to enter    Entrance Stairs-Number of Steps  [place]   One level    [place] - 2 wheels;Hospital bed;Bedside commode;Grab bars - tub/shower;Cane - single point    Additional Comments  currently staying with her dtr, but had her own apartment before CVA      Prior Function   Level of Independence  Independent needs assist with ADL's since CVA    Vocation  Retired    Leisure  mostly sedentary      Cognition   Overall Cognitive Status  Within Functional Limits for tasks assessed      Sensation   Light Touch  Appears Intact      ROM / Strength   AROM / PROM / Strength  Strength      Strength   Strength Assessment Site  Hip;Knee;Ankle    Right/Left Hip  Right;Left    Right Hip Flexion  2/5    Right Hip Extension  2/5    Right Hip ABduction  3+/5    Right Hip ADduction  3+/5    Left Hip Flexion  3-/5    Left Hip Extension  3+/5    Left Hip ABduction  4/5    Left Hip ADduction  4-/5    Right/Left Knee  Right;Left    Right Knee Flexion  3+/5    Right Knee Extension  3+/5    Left Knee Flexion  4-/5    Left Knee Extension  4/5    Right/Left Ankle  Right;Left    Right Ankle Dorsiflexion  3/5    Left Ankle Dorsiflexion  4/5      Ambulation/Gait   Ambulation/Gait  Yes    Ambulation/Gait Assistance  5: Supervision;4: Min guard    Ambulation Distance (Feet)  60 Feet    Assistive device  Rolling walker    Gait Pattern  Decreased hip/knee flexion - right;Decreased hip/knee flexion - left;Decreased dorsiflexion - right;Poor foot clearance - right;Poor foot clearance - left;Right foot flat;Left foot flat;Trunk flexed    Ambulation Surface  Level;Indoor    Gait velocity  0.89 ft/sec      Standardized Balance Assessment   Standardized Balance Assessment  Berg Balance Test;Timed Up and Go Test;10 meter walk test    10 Meter Walk  36.69" with RW      Merrilee Jansky  Balance Test   Sit to Stand  Able to stand using hands after several tries    Standing Unsupported  Able to stand 2 minutes with supervision    Sitting with Back Unsupported  but Feet Supported on Floor or Stool  Able to sit safely and securely 2 minutes    Stand to Sit  Sits independently, has uncontrolled descent    Transfers  Able to transfer with verbal cueing and /or supervision    Standing Unsupported with Eyes Closed  Able to stand 10 seconds with supervision    Standing Ubsupported with Feet Together  Needs help to attain position but able to stand for 30 seconds with feet together    From Standing, Reach Forward with Outstretched Arm  Can reach forward >12 cm safely (5")    From Standing Position, Pick up Object from Floor  Unable to pick up and needs supervision    From Standing Position, Turn to Look Behind Over each Shoulder  Turn sideways only but maintains balance    Turn 360 Degrees  Needs close supervision or verbal cueing    Standing Unsupported, Alternately Place Feet on Step/Stool  Needs assistance to keep from falling or unable to try    Standing Unsupported, One Foot in Front  Needs help to step but can hold 15 seconds    Standing on One Leg  Unable to try or needs assist to prevent fall    Total Score  24    Berg comment:  < 36 high risk for falls (close to 100%)      Timed Up and Go Test   Normal TUG (seconds)  45.57    TUG Comments  >13.5 sec indicates high fall risk                Objective measurements completed on examination: See above findings.                PT Short Term Goals - 06/30/17 1156      PT SHORT TERM GOAL #1   Title  Independent with initial HEP    Status  New    Target Date  07/21/17      PT SHORT TERM GOAL #2   Title  Increase gait speed to >/= 1.4 ft /sec to increase safety with limited community ambulation    Status  New    Target Date  07/28/17        PT Long Term Goals - 06/30/17 1156      PT LONG TERM GOAL #1   Title  Decreas    Status  New    Target Date  08/26/17      PT LONG TERM GOAL #2   Title  Increase B LE strength to >/= 4/5 to 4/5 for increased ease of mobility     Status  New    Target Date  08/26/17      PT LONG TERM GOAL #3   Title  Pt will complete sit to/from stand transfers w/o UE assist demonstrating good control    Status  New    Target Date  08/26/17      PT LONG TERM GOAL #4   Title  Decrease TUG time to </= 25 sec to reduce risk for falls during transitional movements    Status  New    Target Date  08/26/17      PT LONG TERM GOAL #5   Title  Increase gait speed to >/=  1.8 ft/sec with LRAD to reduce risk for recurrent falls    Status  New    Target Date  08/26/17      PT LONG TERM GOAL #6   Title  Increase Berg to >/= 40/56 to reduce fall risk    Status  New    Target Date  08/26/17             Plan - 06/30/17 1156    Clinical Impression Statement  Marita is a 77 y/o female who presents to OP PT s/p L PLICA CVA on 8/67/61 with residual R LE weakness, decreased balance and gait instability. Pt has h/o prior CVA(s) but denies any residual weakness prior to the current CVA. Has been somewhat limited in mobility and gait for ~6 months due to B knee pain necessitating use of RW where she previously has not needed any AD, but states knee pain currently controlled with knee injections. Strength testing revealed B LE weakness, R > L, and more pronounced proximally on the L. Weakness impacts transfer ability and requires use of RW with gait, with decreased gait speed to 0.89 ft/sec and gait pattern demonstrating decreased hip flexion and DF bilaterally resulting in poor foot clearance and lack of heel strike on weight acceptance creating a risk for recurrent falls. Standardized balance testing with TUG and Merrilee Jansky also indicating a very high fall risk. Pt will benefit from skilled PT to address deficits listed to increase strength and balance for increased ease of mobility and improved gait stability to decrease risk for falls.    History and Personal Factors relevant to plan of care:  h/o prior CVAs, AICD, obesity and B knee pain + complex  medical history as above    Clinical Presentation  Evolving    Clinical Presentation due to:  ongoing weakness and gait instabilty with high fall s/p CVA 04/18/17 + complex medical history    Clinical Decision Making  Moderate    Rehab Potential  Good    PT Frequency  2x / week    PT Duration  8 weeks    PT Treatment/Interventions  Patient/family education;ADLs/Self Care Home Management;Therapeutic exercise;Therapeutic activities;Functional mobility training;Gait training;Balance training;Manual techniques;Passive range of motion;Dry needling;Taping;Cryotherapy;Moist Heat    Consulted and Agree with Plan of Care  Patient       Patient will benefit from skilled therapeutic intervention in order to improve the following deficits and impairments:  Decreased strength, Decreased mobility, Decreased balance, Decreased activity tolerance, Difficulty walking, Abnormal gait, Decreased endurance, Decreased safety awareness, Decreased knowledge of precautions  Visit Diagnosis: Hemiplegia and hemiparesis following cerebral infarction affecting right dominant side (HCC)  Muscle weakness (generalized)  Other abnormalities of gait and mobility  Unsteadiness on feet  Difficulty in walking, not elsewhere classified     Problem List Patient Active Problem List   Diagnosis Date Noted  . Labile blood pressure   . Hypoalbuminemia due to protein-calorie malnutrition ([place])   . Acute blood loss anemia   . AKI (acute kidney injury) ([place])   . Peripheral edema   . Stage 3 chronic kidney disease ([place])   . Benign essential HTN   . History of CVA with residual deficit   . Cerebrovascular accident (CVA) due to thrombosis of left middle cerebral artery ([place])   . Right leg weakness 04/19/2017  . Right hemiparesis ([place]) 04/19/2017  . 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 Joint pain 12/09/2015  . ICD (implantable cardioverter-defibrillator) in place 03/26/2015  . Syncope 03/06/2015  .  Obesity (BMI  30-39.9) 01/21/2015  . Excessive daytime sleepiness 08/04/2014  . OSA (obstructive sleep apnea) 08/04/2014  . Essential hypertension 06/13/2014  . Chronic systolic CHF (congestive heart failure) ([place]) 05/20/2014  . Congestive heart disease ([place])   . Chronic combined systolic and diastolic CHF (congestive heart failure) ([place]) 05/03/2014  . CKD (chronic kidney disease), stage III ([place]) 05/03/2014  . Hx of stroke without residual deficits 05/03/2014    Percival Spanish, PT, MPT 06/30/2017, 1:52 PM  [facility] [address]  [place], [place], [address] Phone: [phone]   Fax:  [phone]  Name: Tara Dunn MRN: [id number] Date of Birth: 06-Jul-1940

## 2017-07-06 ENCOUNTER — Encounter: Payer: Self-pay | Admitting: Physical Therapy

## 2017-07-06 ENCOUNTER — Other Ambulatory Visit (HOSPITAL_COMMUNITY): Payer: Self-pay

## 2017-07-06 ENCOUNTER — Ambulatory Visit: Payer: PPO | Admitting: Physical Therapy

## 2017-07-06 DIAGNOSIS — I69351 Hemiplegia and hemiparesis following cerebral infarction affecting right dominant side: Secondary | ICD-10-CM | POA: Diagnosis not present

## 2017-07-06 DIAGNOSIS — R262 Difficulty in walking, not elsewhere classified: Secondary | ICD-10-CM

## 2017-07-06 DIAGNOSIS — R2689 Other abnormalities of gait and mobility: Secondary | ICD-10-CM

## 2017-07-06 DIAGNOSIS — R2681 Unsteadiness on feet: Secondary | ICD-10-CM

## 2017-07-06 DIAGNOSIS — M6281 Muscle weakness (generalized): Secondary | ICD-10-CM

## 2017-07-06 NOTE — Progress Notes (Signed)
Paramedicine Encounter    Patient ID: Tara Dunn, female    DOB: 1940/03/28, 77 y.o.   MRN: [id number]    Patient Care Team: Fanny Bien, MD as PCP - General (Family Medicine) Tobi Bastos, RN as [place] [facility]  Patient Active Problem List   Diagnosis Date Noted  . Labile blood pressure   . Hypoalbuminemia due to protein-calorie malnutrition ([place])   . Acute blood loss anemia   . AKI (acute kidney injury) ([place])   . Peripheral edema   . Stage 3 chronic kidney disease ([place])   . Benign essential HTN   . History of CVA with residual deficit   . Cerebrovascular accident (CVA) due to thrombosis of left middle cerebral artery ([place])   . Right leg weakness 04/19/2017  . Right hemiparesis ([place]) 04/19/2017  . 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 Joint pain 12/09/2015  . ICD (implantable cardioverter-defibrillator) in place 03/26/2015  . Syncope 03/06/2015  . Obesity (BMI 30-39.9) 01/21/2015  . Excessive daytime sleepiness 08/04/2014  . OSA (obstructive sleep apnea) 08/04/2014  . Essential hypertension 06/13/2014  . Chronic systolic CHF (congestive heart failure) ([place]) 05/20/2014  . Congestive heart disease ([place])   . Chronic combined systolic and diastolic CHF (congestive heart failure) ([place]) 05/03/2014  . CKD (chronic kidney disease), stage III ([place]) 05/03/2014  . Hx of stroke without residual deficits 05/03/2014    Current Outpatient Medications:  .  allopurinol (ZYLOPRIM) 100 MG tablet, Take 2 tablets (200 mg total) by mouth daily., Disp: 30 tablet, Rfl: 0 .  atorvastatin (LIPITOR) 80 MG tablet, Take 1 tablet (80 mg total) by mouth daily at 6 PM., Disp: 30 tablet, Rfl: 1 .  carvedilol (COREG) 25 MG tablet, Take 1 tablet (25 mg total) by mouth 2 (two) times daily., Disp: 60 tablet, Rfl: 0 .  clopidogrel (PLAVIX) 75 MG tablet, Take 1 tablet (75 mg total) by mouth daily., Disp: 30 tablet, Rfl: 0 .  furosemide (LASIX) 20 MG tablet, Take 1  tablet (20 mg total) by mouth daily. Take EXTRA 20 mg tablet once daily AS NEEDED for weight gain 3 lbs in 24 hrs or 5 lbs in 1 week., Disp: 180 tablet, Rfl: 3 .  isosorbide-hydrALAZINE (BIDIL) 20-37.5 MG tablet, Take 2 tablets by mouth 2 (two) times daily., Disp: , Rfl:  .  omeprazole (PRILOSEC) 40 MG capsule, Take 1 capsule (40 mg total) by mouth daily., Disp: 30 capsule, Rfl: 0 .  carvedilol (COREG) 25 MG tablet, TAKE 1 TABLET(25 MG) BY MOUTH TWICE DAILY, Disp: 180 tablet, Rfl: 0 .  senna-docusate (SENOKOT-S) 8.6-50 MG tablet, Take 1 tablet by mouth at bedtime as needed for mild constipation. (Patient not taking: Reported on 07/06/2017), Disp: , Rfl:  Allergies  Allergen Reactions  . Shellfish Allergy Hives     Social History   Socioeconomic History  . Marital status: Widowed    Spouse name: Not on file  . Number of children: Not on file  . Years of education: Not on file  . Highest education level: Not on file  Occupational History  . Not on file  Social Needs  . Financial resource strain: Not on file  . Food insecurity:    Worry: Not on file    Inability: Not on file  . Transportation needs:    Medical: Not on file    Non-medical: Not on file  Tobacco Use  . Smoking status: Former Smoker    Packs/day: 0.50  Years: 40.00    Pack years: 20.00    Types: Cigarettes  . Smokeless tobacco: Never Used  . Tobacco comment: Quit in 2009.  Substance and Sexual Activity  . Alcohol use: No    Alcohol/week: 0.0 oz    Comment: "drank occasionally when I was young; last drink was in the late 1990s"  . Drug use: No  . Sexual activity: Never  Lifestyle  . Physical activity:    Days per week: Not on file    Minutes per session: Not on file  . Stress: Not on file  Relationships  . Social connections:    Talks on phone: Not on file    Gets together: Not on file    Attends religious service: Not on file    Active member of club or organization: Not on file    Attends meetings of  clubs or organizations: Not on file    Relationship status: Not on file  . Intimate partner violence:    Fear of current or ex partner: Not on file    Emotionally abused: Not on file    Physically abused: Not on file    Forced sexual activity: Not on file  Other Topics Concern  . Not on file  Social History Narrative   Lives alone in an independent living facility.  Education: 10th grade.     Retired from [employer] work.  Moved here from [place] in 2015.    Physical Exam  Pulmonary/Chest: Effort normal. No respiratory distress.  Abdominal: Soft. There is no guarding.  Musculoskeletal: She exhibits no edema.  Skin: Skin is warm and dry. She is not diaphoretic.        Future Appointments  Date Time Provider [place]  07/06/2017 11:45 AM Percival Spanish, PT [facility]  07/12/2017 11:00 AM Bess Harvest, PTA [facility]  07/15/2017 10:15 AM Bess Harvest, PTA [facility]  07/18/2017 11:00 AM Tobi Bastos, RN [facility] None  07/19/2017 10:15 AM Percival Spanish, PT [facility]  07/22/2017 10:15 AM Bess Harvest, PTA [facility]  07/26/2017 10:15 AM Percival Spanish, PT [facility]  07/29/2017 10:15 AM Bess Harvest, PTA [facility]  08/02/2017 10:15 AM Percival Spanish, PT [facility]  08/05/2017 10:15 AM Bess Harvest, PTA [facility]  08/09/2017 10:15 AM Bess Harvest, PTA [facility]  08/12/2017 10:15 AM Percival Spanish, PT [facility]  08/16/2017 10:15 AM Bess Harvest, PTA [facility]  08/19/2017 10:15 AM Percival Spanish, PT [facility]  08/23/2017 10:15 AM Bess Harvest, PTA [facility]  08/26/2017 10:15 AM Percival Spanish, PT [facility]  09/09/2017 10:00 AM [facility] PA/NP [facility] None  10/24/2017  2:45 PM Venancio Poisson, NP [facility] None    ATF [name] x4 walking from her bedroom/using her walker c/o left foot pain. Pt stated that the bottom of her foot has been hurting for the past couple of days. Pt stated that the pain feels like gout pain.  Theres  no swelling, blister or redness noted to the area where she's experiencing pain.  Pt stated that she hasn't had a good appetite lately but she eats when she takes her medications.  Today is pt's first day going to the gym to work with PT.  Pt stated that she is hopeful to be able to work around better and become more independent.  Pt denies sob, dizziness and chest pain. She has taken all of her medications without missing any.  rx bottles verified  and pill box refilled.     BP 118/80 (BP Location: Right Arm, Patient Position: Sitting, Cuff Size: Large)   Pulse 76   Resp 12   Wt 214 lb 9.6 oz (97.3 kg)   SpO2 97%   BMI 38.01 kg/m   Weight yesterday-didn't weigh Last visit weight-215    Zay Yeargan, EMT Paramedic 07/06/2017    ACTION: Home visit completed

## 2017-07-06 NOTE — Therapy (Signed)
[facility] [address]  [place], [place], [address] Phone: [phone]   Fax:  [phone]  Physical Therapy Treatment  Patient Details  Name: Tara Dunn MRN: [id number] Date of Birth: [birth year] Referring Provider: Venancio Poisson, NP   Encounter Date: 07/06/2017  PT End of Session - 07/06/17 1156    Visit Number  2    Number of Visits  16    Date for PT Re-Evaluation  08/26/17    Authorization Type  [employer]    PT Start Time  1156 pt arrived late    PT Stop Time  1235    PT Time Calculation (min)  39 min    Activity Tolerance  Patient tolerated treatment well;Patient limited by fatigue;Patient limited by pain    Behavior During Therapy  [facility] for tasks assessed/performed       Past Medical History:  Diagnosis Date  . 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 Lovena Le  . Aortic dilatation ([place])    a. 04/2014 CTA chest w/ incidental finding of distal thoracic Ao enlargement of 3.39 mm - f/u needed 04/2015.  . Arthritis    "all over my body"  . Cancer of left breast ([place]) 2009   s/p L mastectomy and chemo.  . Cardiomyopathy ([place])    a. 04/2014 Echo: EF 25-30%, mod conc LVH, possibl antsept HK, Gr1 DD, mod-sev dil LA.  . CHF (congestive heart failure) ([place])   . CKD (chronic kidney disease), stage III ([place])   . Depression   . GERD (gastroesophageal reflux disease)   . Gout   . Heart murmur   . History of stomach ulcers   . Hypertension    a. Dx @ age 68;  b. 04/2014 admission for HTN emergency.  . Obesity (BMI 30-39.9) 01/21/2015  . OSA on CPAP   . Stroke [facility])    a. 3 strokes - last in 1998; "memory problems since"  (03/26/2015)    Past Surgical History:  Procedure Laterality Date  . ABDOMINAL HYSTERECTOMY    . BREAST BIOPSY Left 2008  . CATARACT EXTRACTION, BILATERAL Bilateral   . DILATION AND CURETTAGE OF UTERUS    . EP IMPLANTABLE DEVICE  N/A 03/26/2015   MDT single chamber ICD, Dr. Lovena Le  . LEFT HEART CATHETERIZATION WITH CORONARY ANGIOGRAM N/A 05/07/2014   Procedure: LEFT HEART CATHETERIZATION WITH CORONARY ANGIOGRAM;  Surgeon: Belva Crome, MD;  Location: [facility] CATH LAB;  Service: Cardiovascular;  Laterality: N/A;  . MASTECTOMY Left 2009    There were no vitals filed for this visit.  Subjective Assessment - 07/06/17 1200    Subjective  Pt reporting pain in her L foot today, starting last week - feels like her gout is flaring up.    Pertinent History  CVA - Left PLICA infarct 0/97/3532; remote h/o prior CVAs (most recent in 1998); h/o B knee pain managed with injections    Patient Stated Goals  "walk better"    Currently in Pain?  Yes    Pain Score  8  when she walks    Pain Location  Foot    Pain Orientation  Left;Lower    Pain Descriptors / Indicators  -- unable to describe    Pain Type  Acute pain    Pain Onset  In the past 7 days    Pain Frequency  Intermittent  [facility] Adult PT Treatment/Exercise - 07/06/17 1156      Ambulation/Gait   Ambulation/Gait Assistance  5: Supervision    Ambulation/Gait Assistance Details  Cues for improved RW proximity as well as increased R hip/knee flexion to improve foot clearance.    Ambulation Distance (Feet)  90 Feet    Assistive device  Rolling walker    Gait Pattern  Decreased hip/knee flexion - right;Decreased hip/knee flexion - left;Decreased dorsiflexion - right;Poor foot clearance - right;Poor foot clearance - left;Right foot flat;Left foot flat;Trunk flexed    Ambulation Surface  Level;Indoor      Exercises   Exercises  Knee/Hip      Knee/Hip Exercises: Aerobic   Nustep  L3 x 5' (B UE/LE)      Knee/Hip Exercises: Seated   Long Arc Quad  Right;Left;10 reps    [facility]  B hip adduction pillow squeeze 10 x 5"    Clamshell with TheraBand  Yellow alt hip ABD/ER 10 x 3"    Other Seated Knee/Hip Exercises  Heel raises & toes raises  10 x 3" each    Other Seated Knee/Hip Exercises  Fitter R LE leg press (2 blue) x10    Marching  Right;Left;10 reps    Hamstring Curl  Right;10 reps    Hamstring Limitations  yellow TB             PT Education - 07/06/17 1235    Education provided  Yes    Education Details  Initial HEP    Person(s) Educated  Patient    Methods  Explanation;Demonstration;Handout    Comprehension  Verbalized understanding;Returned demonstration;Need further instruction       PT Short Term Goals - 07/06/17 1203      PT SHORT TERM GOAL #1   Title  Independent with initial HEP    Status  On-going      PT SHORT TERM GOAL #2   Title  Increase gait speed to >/= 1.4 ft /sec to increase safety with limited community ambulation    Status  On-going        PT Long Term Goals - 07/06/17 1203      PT LONG TERM GOAL #1   Title  Decreas    Status  On-going      PT LONG TERM GOAL #2   Title  Increase B LE strength to >/= 4/5 to 4/5 for increased ease of mobility    Status  On-going      PT LONG TERM GOAL #3   Title  Pt will complete sit to/from stand transfers w/o UE assist demonstrating good control    Status  On-going      PT LONG TERM GOAL #4   Title  Decrease TUG time to </= 25 sec to reduce risk for falls during transitional movements    Status  On-going      PT LONG TERM GOAL #5   Title  Increase gait speed to >/= 1.8 ft/sec with LRAD to reduce risk for recurrent falls    Status  On-going      PT LONG TERM GOAL #6   Title  Increase Berg to >/= 40/56 to reduce fall risk    Status  On-going            Plan - 07/06/17 1204    Clinical Impression Statement  Pt reporting flare-up of gout in her L foot limiting standing & walking tolerance, therefore majority of session spent focused on seated exercises  with HEP provided for relevant exercises. Pt experiencing fatigue with warm-up and some of exercises requiring intermittent rest breaks. Treatment concluded with review of gait  training to improve foor clearance and safety with RW to decrease risk for falls.    Rehab Potential  Good    PT Treatment/Interventions  Patient/family education;ADLs/Self Care Home Management;Therapeutic exercise;Therapeutic activities;Functional mobility training;Gait training;Balance training;Manual techniques;Passive range of motion;Dry needling;Taping;Cryotherapy;Moist Heat    Consulted and Agree with Plan of Care  Patient       Patient will benefit from skilled therapeutic intervention in order to improve the following deficits and impairments:  Decreased strength, Decreased mobility, Decreased balance, Decreased activity tolerance, Difficulty walking, Abnormal gait, Decreased endurance, Decreased safety awareness, Decreased knowledge of precautions  Visit Diagnosis: Hemiplegia and hemiparesis following cerebral infarction affecting right dominant side (HCC)  Muscle weakness (generalized)  Other abnormalities of gait and mobility  Unsteadiness on feet  Difficulty in walking, not elsewhere classified     Problem List Patient Active Problem List   Diagnosis Date Noted  . Labile blood pressure   . Hypoalbuminemia due to protein-calorie malnutrition ([place])   . Acute blood loss anemia   . AKI (acute kidney injury) ([place])   . Peripheral edema   . Stage 3 chronic kidney disease ([place])   . Benign essential HTN   . History of CVA with residual deficit   . Cerebrovascular accident (CVA) due to thrombosis of left middle cerebral artery ([place])   . Right leg weakness 04/19/2017  . Right hemiparesis ([place]) 04/19/2017  . 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 Joint pain 12/09/2015  . ICD (implantable cardioverter-defibrillator) in place 03/26/2015  . Syncope 03/06/2015  . Obesity (BMI 30-39.9) 01/21/2015  . Excessive daytime sleepiness 08/04/2014  . OSA (obstructive sleep apnea) 08/04/2014  . Essential hypertension 06/13/2014  . Chronic systolic CHF (congestive heart failure)  ([place]) 05/20/2014  . Congestive heart disease ([place])   . Chronic combined systolic and diastolic CHF (congestive heart failure) ([place]) 05/03/2014  . CKD (chronic kidney disease), stage III ([place]) 05/03/2014  . Hx of stroke without residual deficits 05/03/2014    Percival Spanish, PT, MPT 07/06/2017, 12:51 PM  [facility] [address]  [place], [place], [address] Phone: [phone]   Fax:  [phone]  Name: Tara Dunn MRN: [id number] Date of Birth: December 16, 1940

## 2017-07-11 DIAGNOSIS — G8191 Hemiplegia, unspecified affecting right dominant side: Secondary | ICD-10-CM | POA: Diagnosis not present

## 2017-07-11 DIAGNOSIS — I5022 Chronic systolic (congestive) heart failure: Secondary | ICD-10-CM | POA: Diagnosis not present

## 2017-07-11 DIAGNOSIS — G4733 Obstructive sleep apnea (adult) (pediatric): Secondary | ICD-10-CM | POA: Diagnosis not present

## 2017-07-12 ENCOUNTER — Ambulatory Visit: Payer: PPO | Attending: Adult Health

## 2017-07-12 DIAGNOSIS — I63312 Cerebral infarction due to thrombosis of left middle cerebral artery: Secondary | ICD-10-CM | POA: Insufficient documentation

## 2017-07-12 DIAGNOSIS — R2681 Unsteadiness on feet: Secondary | ICD-10-CM | POA: Insufficient documentation

## 2017-07-12 DIAGNOSIS — R2689 Other abnormalities of gait and mobility: Secondary | ICD-10-CM | POA: Insufficient documentation

## 2017-07-12 DIAGNOSIS — M6281 Muscle weakness (generalized): Secondary | ICD-10-CM | POA: Diagnosis not present

## 2017-07-12 DIAGNOSIS — R262 Difficulty in walking, not elsewhere classified: Secondary | ICD-10-CM | POA: Diagnosis not present

## 2017-07-12 DIAGNOSIS — I69351 Hemiplegia and hemiparesis following cerebral infarction affecting right dominant side: Secondary | ICD-10-CM | POA: Diagnosis not present

## 2017-07-12 NOTE — Therapy (Signed)
[facility] [address]  [place], [place], [address] Phone: [phone]   Fax:  [phone]  Physical Therapy Treatment  Patient Details  Name: Tara Dunn MRN: [id number] Date of Birth: January 07, 1941 Referring Provider: Venancio Poisson, NP   Encounter Date: 07/12/2017  PT End of Session - 07/12/17 1111    Visit Number  3    Number of Visits  16    Date for PT Re-Evaluation  08/26/17    Authorization Type  [employer]    PT Start Time  1105    PT Stop Time  1146    PT Time Calculation (min)  41 min    Activity Tolerance  Patient tolerated treatment well;Patient limited by fatigue    Behavior During Therapy  [facility] for tasks assessed/performed       Past Medical History:  Diagnosis Date  . 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 Lovena Le  . Aortic dilatation ([place])    a. 04/2014 CTA chest w/ incidental finding of distal thoracic Ao enlargement of 3.39 mm - f/u needed 04/2015.  . Arthritis    "all over my body"  . Cancer of left breast ([place]) 2009   s/p L mastectomy and chemo.  . Cardiomyopathy ([place])    a. 04/2014 Echo: EF 25-30%, mod conc LVH, possibl antsept HK, Gr1 DD, mod-sev dil LA.  . CHF (congestive heart failure) ([place])   . CKD (chronic kidney disease), stage III ([place])   . Depression   . GERD (gastroesophageal reflux disease)   . Gout   . Heart murmur   . History of stomach ulcers   . Hypertension    a. Dx @ age 85;  b. 04/2014 admission for HTN emergency.  . Obesity (BMI 30-39.9) 01/21/2015  . OSA on CPAP   . Stroke [facility])    a. 3 strokes - last in 1998; "memory problems since"  (03/26/2015)    Past Surgical History:  Procedure Laterality Date  . ABDOMINAL HYSTERECTOMY    . BREAST BIOPSY Left 2008  . CATARACT EXTRACTION, BILATERAL Bilateral   . DILATION AND CURETTAGE OF UTERUS    . EP IMPLANTABLE DEVICE N/A 03/26/2015   MDT single chamber ICD,  Dr. Lovena Le  . LEFT HEART CATHETERIZATION WITH CORONARY ANGIOGRAM N/A 05/07/2014   Procedure: LEFT HEART CATHETERIZATION WITH CORONARY ANGIOGRAM;  Surgeon: Belva Crome, MD;  Location: [facility] CATH LAB;  Service: Cardiovascular;  Laterality: N/A;  . MASTECTOMY Left 2009    There were no vitals filed for this visit.  Subjective Assessment - 07/12/17 1112    Subjective  Pt. noting L foot pain while walking due to gout.  Doing well today.      Pertinent History  CVA - Left PLICA infarct 10/11/4095; remote h/o prior CVAs (most recent in 1998); h/o B knee pain managed with injections    Patient Stated Goals  "walk better"    Currently in Pain?  No/denies    Pain Score  0-No pain L foot pain rising to 7/10 at worst with walking     Pain Location  Foot    Pain Orientation  Left;Lower    Pain Descriptors / Indicators  Burning    Pain Type  Acute pain    Pain Frequency  Intermittent    Multiple Pain Sites  No  [facility] Adult PT Treatment/Exercise - 07/12/17 1122      Ambulation/Gait   Ambulation/Gait  Yes    Ambulation/Gait Assistance  5: Supervision    Ambulation/Gait Assistance Details  Cues for increased pacing/consistent pacing with RW with light therapist guidance; some carryover following this     Ambulation Distance (Feet)  80 Feet    Assistive device  Rolling walker    Gait Pattern  Decreased hip/knee flexion - right;Decreased hip/knee flexion - left;Decreased dorsiflexion - right;Poor foot clearance - right;Poor foot clearance - left;Right foot flat;Left foot flat;Trunk flexed    Ambulation Surface  Level;Indoor      Knee/Hip Exercises: Aerobic   Nustep  L3 x 6' (B UE/LE)      Knee/Hip Exercises: Standing   Hip Flexion  Right;Left;10 reps;Knee bent    Hip Flexion Limitations  in RW; limited ROM    Other Standing Knee Exercises  Standing R wt. shift with R UE touch to cone on white bolster x 10 reps; manually guarded at B knees by therapist        Knee/Hip Exercises: Seated   Long Arc Quad  Right;Left;15 reps;Strengthening    Long Arc Quad Limitations  adduction ball squeeze     Ball Squeeze  B hip adduction pillow squeeze 15 x 5"    Hamstring Curl  Right;Left;15 reps;Strengthening    Sit to Sand  5 reps;with UE support;2 sets supervision; from mat table to standing with RW               PT Short Term Goals - 07/06/17 1203      PT SHORT TERM GOAL #1   Title  Independent with initial HEP    Status  On-going      PT SHORT TERM GOAL #2   Title  Increase gait speed to >/= 1.4 ft /sec to increase safety with limited community ambulation    Status  On-going        PT Long Term Goals - 07/12/17 1204      PT LONG TERM GOAL #1   Title  Independent with ongoing/advanced HEP     Status  On-going      PT LONG TERM GOAL #2   Title  Increase B LE strength to >/= 4/5 to 4/5 for increased ease of mobility    Status  On-going      PT LONG TERM GOAL #3   Title  Pt will complete sit to/from stand transfers w/o UE assist demonstrating good control    Status  On-going      PT LONG TERM GOAL #4   Title  Decrease TUG time to </= 25 sec to reduce risk for falls during transitional movements    Status  On-going      PT LONG TERM GOAL #5   Title  Increase gait speed to >/= 1.8 ft/sec with LRAD to reduce risk for recurrent falls    Status  On-going      PT LONG TERM GOAL #6   Title  Increase Berg to >/= 40/56 to reduce fall risk    Status  On-going            Plan - 07/12/17 1115    Clinical Impression Statement  Tara Dunn doing well today aside from complaint of continued L foot pain while walking which she attributes to gout.  Treatment focusing on R LE strengthening activities in seated to pt. tolerance as pt. requiring some rest between sets due to fatigue.  Did initiated standing R wt. shift with UE reach which was tolerated well.  Did require cueing today for consistent pacing with RW with gait training today with  some carryover.  Pt. ended treatment verbalizing some LE fatigue however tolerated session well today.  Will continue to progress toward goals.      PT Treatment/Interventions  Patient/family education;ADLs/Self Care Home Management;Therapeutic exercise;Therapeutic activities;Functional mobility training;Gait training;Balance training;Manual techniques;Passive range of motion;Dry needling;Taping;Cryotherapy;Moist Heat    Consulted and Agree with Plan of Care  Patient       Patient will benefit from skilled therapeutic intervention in order to improve the following deficits and impairments:  Decreased strength, Decreased mobility, Decreased balance, Decreased activity tolerance, Difficulty walking, Abnormal gait, Decreased endurance, Decreased safety awareness, Decreased knowledge of precautions  Visit Diagnosis: Hemiplegia and hemiparesis following cerebral infarction affecting right dominant side (HCC)  Muscle weakness (generalized)  Other abnormalities of gait and mobility  Unsteadiness on feet  Difficulty in walking, not elsewhere classified     Problem List Patient Active Problem List   Diagnosis Date Noted  . Labile blood pressure   . Hypoalbuminemia due to protein-calorie malnutrition ([place])   . Acute blood loss anemia   . AKI (acute kidney injury) ([place])   . Peripheral edema   . Stage 3 chronic kidney disease ([place])   . Benign essential HTN   . History of CVA with residual deficit   . Cerebrovascular accident (CVA) due to thrombosis of left middle cerebral artery ([place])   . Right leg weakness 04/19/2017  . Right hemiparesis ([place]) 04/19/2017  . 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 Joint pain 12/09/2015  . ICD (implantable cardioverter-defibrillator) in place 03/26/2015  . Syncope 03/06/2015  . Obesity (BMI 30-39.9) 01/21/2015  . Excessive daytime sleepiness 08/04/2014  . OSA (obstructive sleep apnea) 08/04/2014  . Essential hypertension 06/13/2014  . Chronic  systolic CHF (congestive heart failure) ([place]) 05/20/2014  . Congestive heart disease ([place])   . Chronic combined systolic and diastolic CHF (congestive heart failure) ([place]) 05/03/2014  . CKD (chronic kidney disease), stage III ([place]) 05/03/2014  . Hx of stroke without residual deficits 05/03/2014    Bess Harvest, PTA 07/12/17 12:05 PM  [facility] [address]  [place], [place], [address] Phone: [phone]   Fax:  [phone]  Name: Tara Dunn MRN: [id number] Date of Birth: 1940-09-24

## 2017-07-13 ENCOUNTER — Other Ambulatory Visit (HOSPITAL_COMMUNITY): Payer: Self-pay

## 2017-07-13 ENCOUNTER — Other Ambulatory Visit (HOSPITAL_COMMUNITY): Payer: Self-pay | Admitting: *Deleted

## 2017-07-13 ENCOUNTER — Encounter (HOSPITAL_COMMUNITY): Payer: Self-pay

## 2017-07-13 MED ORDER — ATORVASTATIN CALCIUM 80 MG PO TABS: 80.0000 mg | ORAL_TABLET | Freq: Every day | ORAL | 3 refills | Status: DC

## 2017-07-13 MED ORDER — ATORVASTATIN CALCIUM 80 MG PO TABS: 80.0000 mg | ORAL_TABLET | Freq: Every day | ORAL | 1 refills | Status: DC

## 2017-07-13 NOTE — Progress Notes (Signed)
Paramedicine Encounter    Patient ID: Tara Dunn, female    DOB: 11-Oct-1940, 77 y.o.   MRN: [id number]    Patient Care Team: Fanny Bien, MD as PCP - General (Family Medicine) Tobi Bastos, RN as [facility] Management  Patient Active Problem List   Diagnosis Date Noted  . Labile blood pressure   . Hypoalbuminemia due to protein-calorie malnutrition ([place])   . Acute blood loss anemia   . AKI (acute kidney injury) ([place])   . Peripheral edema   . Stage 3 chronic kidney disease ([place])   . Benign essential HTN   . History of CVA with residual deficit   . Cerebrovascular accident (CVA) due to thrombosis of left middle cerebral artery ([place])   . Right leg weakness 04/19/2017  . Right hemiparesis ([place]) 04/19/2017  . 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 Joint pain 12/09/2015  . ICD (implantable cardioverter-defibrillator) in place 03/26/2015  . Syncope 03/06/2015  . Obesity (BMI 30-39.9) 01/21/2015  . Excessive daytime sleepiness 08/04/2014  . OSA (obstructive sleep apnea) 08/04/2014  . Essential hypertension 06/13/2014  . Chronic systolic CHF (congestive heart failure) ([place]) 05/20/2014  . Congestive heart disease ([place])   . Chronic combined systolic and diastolic CHF (congestive heart failure) ([place]) 05/03/2014  . CKD (chronic kidney disease), stage III ([place]) 05/03/2014  . Hx of stroke without residual deficits 05/03/2014    Current Outpatient Medications:  .  allopurinol (ZYLOPRIM) 100 MG tablet, Take 2 tablets (200 mg total) by mouth daily., Disp: 30 tablet, Rfl: 0 .  atorvastatin (LIPITOR) 80 MG tablet, Take 1 tablet (80 mg total) by mouth daily at 6 PM., Disp: 30 tablet, Rfl: 3 .  carvedilol (COREG) 25 MG tablet, Take 1 tablet (25 mg total) by mouth 2 (two) times daily., Disp: 60 tablet, Rfl: 0 .  carvedilol (COREG) 25 MG tablet, TAKE 1 TABLET(25 MG) BY MOUTH TWICE DAILY, Disp: 180 tablet, Rfl: 0 .  clopidogrel (PLAVIX) 75 MG tablet, Take 1  tablet (75 mg total) by mouth daily., Disp: 30 tablet, Rfl: 0 .  furosemide (LASIX) 20 MG tablet, Take 1 tablet (20 mg total) by mouth daily. Take EXTRA 20 mg tablet once daily AS NEEDED for weight gain 3 lbs in 24 hrs or 5 lbs in 1 week., Disp: 180 tablet, Rfl: 3 .  isosorbide-hydrALAZINE (BIDIL) 20-37.5 MG tablet, Take 2 tablets by mouth 2 (two) times daily., Disp: , Rfl:  .  omeprazole (PRILOSEC) 40 MG capsule, Take 1 capsule (40 mg total) by mouth daily., Disp: 30 capsule, Rfl: 0 .  senna-docusate (SENOKOT-S) 8.6-50 MG tablet, Take 1 tablet by mouth at bedtime as needed for mild constipation. (Patient not taking: Reported on 07/06/2017), Disp: , Rfl:  Allergies  Allergen Reactions  . Shellfish Allergy Hives     Social History   Socioeconomic History  . Marital status: Widowed    Spouse name: Not on file  . Number of children: Not on file  . Years of education: Not on file  . Highest education level: Not on file  Occupational History  . Not on file  Social Needs  . Financial resource strain: Not on file  . Food insecurity:    Worry: Not on file    Inability: Not on file  . Transportation needs:    Medical: Not on file    Non-medical: Not on file  Tobacco Use  . Smoking status: Former Smoker    Packs/day: 0.50  Years: 40.00    Pack years: 20.00    Types: Cigarettes  . Smokeless tobacco: Never Used  . Tobacco comment: Quit in 2009.  Substance and Sexual Activity  . Alcohol use: No    Alcohol/week: 0.0 oz    Comment: "drank occasionally when I was young; last drink was in the late 1990s"  . Drug use: No  . Sexual activity: Never  Lifestyle  . Physical activity:    Days per week: Not on file    Minutes per session: Not on file  . Stress: Not on file  Relationships  . Social connections:    Talks on phone: Not on file    Gets together: Not on file    Attends religious service: Not on file    Active member of club or organization: Not on file    Attends meetings of  clubs or organizations: Not on file    Relationship status: Not on file  . Intimate partner violence:    Fear of current or ex partner: Not on file    Emotionally abused: Not on file    Physically abused: Not on file    Forced sexual activity: Not on file  Other Topics Concern  . Not on file  Social History Narrative   Lives alone in an independent living facility.  Education: 10th grade.     Retired from [employer] work.  Moved here from [place] in 2015.    Physical Exam  Pulmonary/Chest: Effort normal. No respiratory distress.  Abdominal: Soft.  Musculoskeletal: She exhibits edema.  Both feet and ankles  Skin: Skin is warm and dry. She is not diaphoretic.        Future Appointments  Date Time Provider [place]  07/15/2017 10:15 AM Bess Harvest, PTA [facility]  07/18/2017 11:00 AM Tobi Bastos, RN [facility] None  07/19/2017 10:15 AM Percival Spanish, PT [facility]  07/22/2017 10:15 AM Bess Harvest, PTA [facility]  07/26/2017 10:15 AM Percival Spanish, PT [facility]  07/29/2017 10:15 AM Bess Harvest, PTA [facility]  08/02/2017 10:15 AM Percival Spanish, PT [facility]  08/05/2017 10:15 AM Bess Harvest, PTA [facility]  08/09/2017 10:15 AM Bess Harvest, PTA [facility]  08/12/2017 10:15 AM Percival Spanish, PT [facility]  08/16/2017 10:15 AM Bess Harvest, PTA [facility]  08/19/2017 10:15 AM Percival Spanish, PT [facility]  08/23/2017 10:15 AM Bess Harvest, PTA [facility]  08/26/2017 10:15 AM Percival Spanish, PT [facility]  09/09/2017 10:00 AM [facility] PA/NP [facility] None  10/24/2017  2:45 PM Venancio Poisson, NP [facility] None    ATF [name] x4 sitting in the chair in the kitchen very upset.  Pt had just received the news that her son had passed away about 10 mins prior to my arrival.  Pt's daughter and granddaughter is present with her during this time.  Pt has taken all of her medications without missing any this week. I was going to start the  bi-weekly pill boxes but I will wait until next week.  Pt has the hx of not eating or taking her medications when she's stressed our depressed.  Pt was advised to try her best to eat at least enough before she takes her medications.  Her daughter will get her some ensure in the meantime, just in case.  Pt's vitals noted; pt denies sob, chest pain and dizziness.  Pt has edema noted to her feet and ankles.  We discussed  her fluid intake a little. rx bottles verified and pill box refilled.  According to epic medication list, pt is supposed to take 80 mg of atorvastatin once daily, changed on 05/21/17.  Put her rx bottle shows 40mg  daily. [name] at heart failure clinic advised that epic was correct and she would send the new rx over to the pharmacy.     BP 118/70 (BP Location: Right Arm, Patient Position: Sitting, Cuff Size: Large)   Resp 16   Wt 218 lb 3.2 oz (99 kg)   SpO2 98%   BMI 38.65 kg/m   Weight yesterday-didn't weigh Last visit weight-214    Maxamus Colao, EMT Paramedic 07/13/2017    ACTION: Home visit completed

## 2017-07-15 ENCOUNTER — Ambulatory Visit: Payer: PPO

## 2017-07-15 DIAGNOSIS — I69351 Hemiplegia and hemiparesis following cerebral infarction affecting right dominant side: Secondary | ICD-10-CM

## 2017-07-15 DIAGNOSIS — R2689 Other abnormalities of gait and mobility: Secondary | ICD-10-CM

## 2017-07-15 DIAGNOSIS — I63312 Cerebral infarction due to thrombosis of left middle cerebral artery: Secondary | ICD-10-CM

## 2017-07-15 DIAGNOSIS — R2681 Unsteadiness on feet: Secondary | ICD-10-CM

## 2017-07-15 DIAGNOSIS — M6281 Muscle weakness (generalized): Secondary | ICD-10-CM

## 2017-07-15 DIAGNOSIS — R262 Difficulty in walking, not elsewhere classified: Secondary | ICD-10-CM

## 2017-07-15 NOTE — Therapy (Signed)
[facility] [address]  [place], [place], [address] Phone: [phone]   Fax:  [phone]  Physical Therapy Treatment  Patient Details  Name: Tara Dunn MRN: [id number] Date of Birth: Mar 11, 1940 Referring Provider: Venancio Poisson, NP   Encounter Date: 07/15/2017  PT End of Session - 07/15/17 1035    Visit Number  4    Number of Visits  16    Date for PT Re-Evaluation  08/26/17    Authorization Type  [employer]    PT Start Time  1018    PT Stop Time  1059    PT Time Calculation (min)  41 min    Activity Tolerance  Patient tolerated treatment well;Patient limited by fatigue    Behavior During Therapy  [facility] for tasks assessed/performed       Past Medical History:  Diagnosis Date  . 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 Lovena Le  . Aortic dilatation ([place])    a. 04/2014 CTA chest w/ incidental finding of distal thoracic Ao enlargement of 3.39 mm - f/u needed 04/2015.  . Arthritis    "all over my body"  . Cancer of left breast ([place]) 2009   s/p L mastectomy and chemo.  . Cardiomyopathy ([place])    a. 04/2014 Echo: EF 25-30%, mod conc LVH, possibl antsept HK, Gr1 DD, mod-sev dil LA.  . CHF (congestive heart failure) ([place])   . CKD (chronic kidney disease), stage III ([place])   . Depression   . GERD (gastroesophageal reflux disease)   . Gout   . Heart murmur   . History of stomach ulcers   . Hypertension    a. Dx @ age 76;  b. 04/2014 admission for HTN emergency.  . Obesity (BMI 30-39.9) 01/21/2015  . OSA on CPAP   . Stroke [facility])    a. 3 strokes - last in 1998; "memory problems since"  (03/26/2015)    Past Surgical History:  Procedure Laterality Date  . ABDOMINAL HYSTERECTOMY    . BREAST BIOPSY Left 2008  . CATARACT EXTRACTION, BILATERAL Bilateral   . DILATION AND CURETTAGE OF UTERUS    . EP IMPLANTABLE DEVICE N/A 03/26/2015   MDT single chamber ICD,  Dr. Lovena Le  . LEFT HEART CATHETERIZATION WITH CORONARY ANGIOGRAM N/A 05/07/2014   Procedure: LEFT HEART CATHETERIZATION WITH CORONARY ANGIOGRAM;  Surgeon: Belva Crome, MD;  Location: [facility] CATH LAB;  Service: Cardiovascular;  Laterality: N/A;  . MASTECTOMY Left 2009    There were no vitals filed for this visit.  Subjective Assessment - 07/15/17 1034    Subjective  Son passed away a few days ago.  Will miss session on Tuesday due to funeral arrangements.      Pertinent History  CVA - Left PLICA infarct 7/98/9211; remote h/o prior CVAs (most recent in 1998); h/o B knee pain managed with injections    Patient Stated Goals  "walk better"    Currently in Pain?  No/denies    Pain Score  0-No pain    Multiple Pain Sites  No                       [facility] Adult PT Treatment/Exercise - 07/15/17 1042      Transfers   Transfers  Stand Pivot Transfers    Stand Pivot Transfers  5: Supervision    Stand Pivot Transfer Details (indicate cue type and reason)  Cues required to  increased anterior wt. shift and forward trunk flexion with sit>stand and cues required for slow lowering under control with stand>sit to mat table for safety       Ambulation/Gait   Ambulation/Gait  Yes    Ambulation/Gait Assistance  5: Supervision    Ambulation/Gait Assistance Details  Cues to maintain consistent RW pacing and for upright posture and increased B step length and increased LE clearance     Ambulation Distance (Feet)  90 Feet    Assistive device  Rolling walker    Gait Pattern  Decreased hip/knee flexion - right;Decreased hip/knee flexion - left;Decreased dorsiflexion - right;Poor foot clearance - right;Poor foot clearance - left;Right foot flat;Left foot flat;Trunk flexed    Ambulation Surface  Level;Indoor      Therapeutic Activites    Therapeutic Activities  Other Therapeutic Activities      Knee/Hip Exercises: Aerobic   Nustep  L4 x 6' (B UE/LE)      Knee/Hip Exercises: Standing   Other  Standing Knee Exercises  Standing side<>side stepping at mat table with 2 HH assit from therapist 4 x 10 ft       Knee/Hip Exercises: Seated   Long Arc Quad  Right;Left;15 reps;Strengthening Working on 3" hold - difficulty     [place] engineer Limitations  adduction ball squeeze     Marching  Right;Left;10 reps    Marching Limitations  therapist assistance with R LE con    Hamstring Curl  Right;10 reps    Hamstring Limitations  red TB    Sit to Sand  5 reps;with UE support;2 sets from mat table and airex pad; 2 UE support                PT Short Term Goals - 07/15/17 1036      PT SHORT TERM GOAL #1   Title  Independent with initial HEP    Status  Achieved      PT SHORT TERM GOAL #2   Title  Increase gait speed to >/= 1.4 ft /sec to increase safety with limited community ambulation    Status  On-going        PT Long Term Goals - 07/12/17 1204      PT LONG TERM GOAL #1   Title  Independent with ongoing/advanced HEP     Status  On-going      PT LONG TERM GOAL #2   Title  Increase B LE strength to >/= 4/5 to 4/5 for increased ease of mobility    Status  On-going      PT LONG TERM GOAL #3   Title  Pt will complete sit to/from stand transfers w/o UE assist demonstrating good control    Status  On-going      PT LONG TERM GOAL #4   Title  Decrease TUG time to </= 25 sec to reduce risk for falls during transitional movements    Status  On-going      PT LONG TERM GOAL #5   Title  Increase gait speed to >/= 1.8 ft/sec with LRAD to reduce risk for recurrent falls    Status  On-going      PT LONG TERM GOAL #6   Title  Increase Berg to >/= 40/56 to reduce fall risk    Status  On-going            Plan - 07/15/17 1053    Clinical Impression Statement  Pt. reporting her Son passed away a few  days ago and last few days have been difficult to perform HEP.  Session focusing on LE strengthening with cueing required for proper safety with transfers and gait with RW to  improve LE clearance.  Pt. tolerated all activities well today however did require increased rest time between activities due to fatigue.  Pt. will continue to benefit from further skilled therapy to improve functional LE strength and safety with gait and transfers with RW.      PT Treatment/Interventions  Patient/family education;ADLs/Self Care Home Management;Therapeutic exercise;Therapeutic activities;Functional mobility training;Gait training;Balance training;Manual techniques;Passive range of motion;Dry needling;Taping;Cryotherapy;Moist Heat    Consulted and Agree with Plan of Care  Patient       Patient will benefit from skilled therapeutic intervention in order to improve the following deficits and impairments:  Decreased strength, Decreased mobility, Decreased balance, Decreased activity tolerance, Difficulty walking, Abnormal gait, Decreased endurance, Decreased safety awareness, Decreased knowledge of precautions  Visit Diagnosis: Hemiplegia and hemiparesis following cerebral infarction affecting right dominant side (HCC)  Muscle weakness (generalized)  Other abnormalities of gait and mobility  Unsteadiness on feet  Difficulty in walking, not elsewhere classified  Cerebrovascular accident (CVA) due to thrombosis of left middle cerebral artery [facility])     Problem List Patient Active Problem List   Diagnosis Date Noted  . Labile blood pressure   . Hypoalbuminemia due to protein-calorie malnutrition ([place])   . Acute blood loss anemia   . AKI (acute kidney injury) ([place])   . Peripheral edema   . Stage 3 chronic kidney disease ([place])   . Benign essential HTN   . History of CVA with residual deficit   . Cerebrovascular accident (CVA) due to thrombosis of left middle cerebral artery ([place])   . Right leg weakness 04/19/2017  . Right hemiparesis ([place]) 04/19/2017  . 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 Joint pain 12/09/2015  . ICD (implantable cardioverter-defibrillator) in  place 03/26/2015  . Syncope 03/06/2015  . Obesity (BMI 30-39.9) 01/21/2015  . Excessive daytime sleepiness 08/04/2014  . OSA (obstructive sleep apnea) 08/04/2014  . Essential hypertension 06/13/2014  . Chronic systolic CHF (congestive heart failure) ([place]) 05/20/2014  . Congestive heart disease ([place])   . Chronic combined systolic and diastolic CHF (congestive heart failure) ([place]) 05/03/2014  . CKD (chronic kidney disease), stage III ([place]) 05/03/2014  . Hx of stroke without residual deficits 05/03/2014   Bess Harvest, PTA 07/15/17 11:25 AM  [facility] [address]  [place], [place], [address] Phone: [phone]   Fax:  [phone]  Name: Tara Dunn MRN: [id number] Date of Birth: 14-Sep-1940

## 2017-07-18 ENCOUNTER — Other Ambulatory Visit: Payer: Self-pay | Admitting: *Deleted

## 2017-07-18 NOTE — Patient Outreach (Signed)
[facility]) Care Management  07/18/2017  Tara Dunn May 12, 1940 [id number]    Telephone Assessment  RN spoke briefly with the pt's consented daughter [name]) and reintroduced [facility] once again with inquire on if this was a good time to talk on pt's progress. Daughter indicated not a good time due to the death of brother at this time and indicated she would call RN case manger back in a few days. Will await a call back in a few days or follow up next week to completed update on pt's management of care via telephone case management.  [name], RN Care Management Coordinator [place] Office [phone]

## 2017-07-19 ENCOUNTER — Ambulatory Visit: Payer: PPO | Admitting: Physical Therapy

## 2017-07-19 ENCOUNTER — Encounter: Payer: Self-pay | Admitting: Cardiology

## 2017-07-20 ENCOUNTER — Other Ambulatory Visit (HOSPITAL_COMMUNITY): Payer: Self-pay

## 2017-07-20 ENCOUNTER — Telehealth (HOSPITAL_COMMUNITY): Payer: Self-pay | Admitting: *Deleted

## 2017-07-20 NOTE — Progress Notes (Signed)
Paramedicine Encounter    Patient ID: Tara Dunn, female    DOB: Nov 11, 1940, 77 y.o.   MRN: [id number]    Patient Care Team: Fanny Bien, MD as PCP - General (Family Medicine) Tobi Bastos, RN as [place] Management  Patient Active Problem List   Diagnosis Date Noted  . Labile blood pressure   . Hypoalbuminemia due to protein-calorie malnutrition ([place])   . Acute blood loss anemia   . AKI (acute kidney injury) ([place])   . Peripheral edema   . Stage 3 chronic kidney disease ([place])   . Benign essential HTN   . History of CVA with residual deficit   . Cerebrovascular accident (CVA) due to thrombosis of left middle cerebral artery ([place])   . Right leg weakness 04/19/2017  . Right hemiparesis ([place]) 04/19/2017  . 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 Joint pain 12/09/2015  . ICD (implantable cardioverter-defibrillator) in place 03/26/2015  . Syncope 03/06/2015  . Obesity (BMI 30-39.9) 01/21/2015  . Excessive daytime sleepiness 08/04/2014  . OSA (obstructive sleep apnea) 08/04/2014  . Essential hypertension 06/13/2014  . Chronic systolic CHF (congestive heart failure) ([place]) 05/20/2014  . Congestive heart disease ([place])   . Chronic combined systolic and diastolic CHF (congestive heart failure) ([place]) 05/03/2014  . CKD (chronic kidney disease), stage III ([place]) 05/03/2014  . Hx of stroke without residual deficits 05/03/2014    Current Outpatient Medications:  .  allopurinol (ZYLOPRIM) 100 MG tablet, Take 2 tablets (200 mg total) by mouth daily., Disp: 30 tablet, Rfl: 0 .  atorvastatin (LIPITOR) 80 MG tablet, Take 1 tablet (80 mg total) by mouth daily at 6 PM., Disp: 30 tablet, Rfl: 3 .  carvedilol (COREG) 25 MG tablet, Take 1 tablet (25 mg total) by mouth 2 (two) times daily., Disp: 60 tablet, Rfl: 0 .  clopidogrel (PLAVIX) 75 MG tablet, Take 1 tablet (75 mg total) by mouth daily., Disp: 30 tablet, Rfl: 0 .  furosemide (LASIX) 20 MG tablet, Take 1  tablet (20 mg total) by mouth daily. Take EXTRA 20 mg tablet once daily AS NEEDED for weight gain 3 lbs in 24 hrs or 5 lbs in 1 week., Disp: 180 tablet, Rfl: 3 .  isosorbide-hydrALAZINE (BIDIL) 20-37.5 MG tablet, Take 2 tablets by mouth 2 (two) times daily., Disp: , Rfl:  .  omeprazole (PRILOSEC) 40 MG capsule, Take 1 capsule (40 mg total) by mouth daily., Disp: 30 capsule, Rfl: 0 .  carvedilol (COREG) 25 MG tablet, TAKE 1 TABLET(25 MG) BY MOUTH TWICE DAILY, Disp: 180 tablet, Rfl: 0 .  senna-docusate (SENOKOT-S) 8.6-50 MG tablet, Take 1 tablet by mouth at bedtime as needed for mild constipation. (Patient not taking: Reported on 07/06/2017), Disp: , Rfl:  Allergies  Allergen Reactions  . Shellfish Allergy Hives     Social History   Socioeconomic History  . Marital status: Widowed    Spouse name: Not on file  . Number of children: Not on file  . Years of education: Not on file  . Highest education level: Not on file  Occupational History  . Not on file  Social Needs  . Financial resource strain: Not on file  . Food insecurity:    Worry: Not on file    Inability: Not on file  . Transportation needs:    Medical: Not on file    Non-medical: Not on file  Tobacco Use  . Smoking status: Former Smoker    Packs/day: 0.50  Years: 40.00    Pack years: 20.00    Types: Cigarettes  . Smokeless tobacco: Never Used  . Tobacco comment: Quit in 2009.  Substance and Sexual Activity  . Alcohol use: No    Alcohol/week: 0.0 oz    Comment: "drank occasionally when I was young; last drink was in the late 1990s"  . Drug use: No  . Sexual activity: Never  Lifestyle  . Physical activity:    Days per week: Not on file    Minutes per session: Not on file  . Stress: Not on file  Relationships  . Social connections:    Talks on phone: Not on file    Gets together: Not on file    Attends religious service: Not on file    Active member of club or organization: Not on file    Attends meetings of  clubs or organizations: Not on file    Relationship status: Not on file  . Intimate partner violence:    Fear of current or ex partner: Not on file    Emotionally abused: Not on file    Physically abused: Not on file    Forced sexual activity: Not on file  Other Topics Concern  . Not on file  Social History Narrative   Lives alone in an independent living facility.  Education: 10th grade.     Retired from [employer] work.  Moved here from [place] in 2015.    Physical Exam  Pulmonary/Chest: Effort normal.  Abdominal: Soft. She exhibits no distension.  Musculoskeletal: She exhibits edema.  Both feet and ankles  Skin: She is not diaphoretic.        Future Appointments  Date Time Provider [place]  07/22/2017 10:15 AM Bess Harvest, PTA [facility]  07/25/2017 11:00 AM Tobi Bastos, RN [facility] None  07/26/2017 10:15 AM Percival Spanish, PT [facility]  07/29/2017 10:15 AM Bess Harvest, PTA [facility]  08/02/2017 10:15 AM Percival Spanish, PT [facility]  08/05/2017 10:15 AM Bess Harvest, PTA [facility]  08/09/2017 10:15 AM Bess Harvest, PTA [facility]  08/12/2017 10:15 AM Percival Spanish, PT [facility]  08/16/2017 10:15 AM Bess Harvest, PTA [facility]  08/19/2017 10:15 AM Percival Spanish, PT [facility]  08/23/2017 10:15 AM Bess Harvest, PTA [facility]  08/26/2017 10:15 AM Percival Spanish, PT [facility]  09/09/2017 10:00 AM [facility] PA/NP [facility] None  10/24/2017  2:45 PM Venancio Poisson, NP [facility] None    ATF [name] x4 sitting in her recliner watching tv.  Pt's son passed away last week; she stated that she feels good considering.  Pt stated that she had to wear tennis shoes to the funeral yesterday due to her feet being swollen; pt has edma noted to both feet and ankles (+1).  Pt denies sob, chest pain and dizziness.  Pt's weight did not increase since last week.  Pt took all of her medications this week without missing any. Pt stated that she had  diarhrea yesterday; no abdominal pain and no nausea.  No symptoms noted today.  We discussed the foods that she has been eating.  rx bottles verified and pill box refilled.   [name] at the heart failure clinic advised that pt take another .5  Of furosemide; Jonni Sanger agrees.  I left the pill for pt to take when she returns home this afternoon.    BP 108/80 (BP Location: Right Arm, Patient Position: Sitting)   Pulse 72  Resp 16   Wt 218 lb 3.2 oz (99 kg)   SpO2 97%   BMI 38.65 kg/m   Weight yesterday-didn't weigh Last visit weight-218    Wm Sahagun, EMT Paramedic 07/20/2017    ACTION: Home visit completed

## 2017-07-20 NOTE — Telephone Encounter (Signed)
Dee called to report increased swelling in pts feet and ankles. Pts weight is the same as last week but has admitted to eating fried/salty foods. Per Caryl Pina patient can take extra 20mg  of lasix today and keep feet elevated. Karena Addison is aware and agreeable with plan.

## 2017-07-22 ENCOUNTER — Ambulatory Visit: Payer: PPO

## 2017-07-25 ENCOUNTER — Other Ambulatory Visit: Payer: Self-pay | Admitting: *Deleted

## 2017-07-25 ENCOUNTER — Encounter: Payer: Self-pay | Admitting: *Deleted

## 2017-07-25 NOTE — Patient Outreach (Signed)
[facility]) Care Management  07/25/2017  Ranita Smotherman 13-Mar-1940 [id number]    Unsuccessful outreach today however was able to leave a HIPAA approved voice message requesting a call back. Will inquired further on pt's progress related to the goals and interventions noted. Pt has been progressing well over the past month. Will discuss a health coach with plans to graduate pt from the community case management services.  Note [facility] was involved on the last conversation. Will scheduled another follow up call accordingly.  [name], RN Care Management Coordinator [place] Office [phone]

## 2017-07-26 ENCOUNTER — Ambulatory Visit: Payer: PPO | Admitting: Physical Therapy

## 2017-07-26 ENCOUNTER — Encounter: Payer: Self-pay | Admitting: Physical Therapy

## 2017-07-26 DIAGNOSIS — I69351 Hemiplegia and hemiparesis following cerebral infarction affecting right dominant side: Secondary | ICD-10-CM

## 2017-07-26 DIAGNOSIS — R2689 Other abnormalities of gait and mobility: Secondary | ICD-10-CM

## 2017-07-26 DIAGNOSIS — M6281 Muscle weakness (generalized): Secondary | ICD-10-CM

## 2017-07-26 DIAGNOSIS — R262 Difficulty in walking, not elsewhere classified: Secondary | ICD-10-CM

## 2017-07-26 DIAGNOSIS — R2681 Unsteadiness on feet: Secondary | ICD-10-CM

## 2017-07-26 NOTE — Therapy (Addendum)
[facility] [address]  [place], [place], [address] Phone: [phone]   Fax:  [phone]  Physical Therapy Treatment  Patient Details  Name: Tara Dunn MRN: [id number] Date of Birth: 1940/09/06 Referring Provider: Venancio Poisson, NP   Encounter Date: 07/26/2017  PT End of Session - 07/26/17 1008    Visit Number  5    Number of Visits  16    Date for PT Re-Evaluation  08/26/17    Authorization Type  [employer]    PT Start Time  1008    PT Stop Time  1058    PT Time Calculation (min)  50 min    Activity Tolerance  Patient tolerated treatment well;Patient limited by fatigue    Behavior During Therapy  [facility] for tasks assessed/performed       Past Medical History:  Diagnosis Date  . 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 Lovena Le  . Aortic dilatation ([place])    a. 04/2014 CTA chest w/ incidental finding of distal thoracic Ao enlargement of 3.39 mm - f/u needed 04/2015.  . Arthritis    "all over my body"  . Cancer of left breast ([place]) 2009   s/p L mastectomy and chemo.  . Cardiomyopathy ([place])    a. 04/2014 Echo: EF 25-30%, mod conc LVH, possibl antsept HK, Gr1 DD, mod-sev dil LA.  . CHF (congestive heart failure) ([place])   . CKD (chronic kidney disease), stage III ([place])   . Depression   . GERD (gastroesophageal reflux disease)   . Gout   . Heart murmur   . History of stomach ulcers   . Hypertension    a. Dx @ age 54;  b. 04/2014 admission for HTN emergency.  . Obesity (BMI 30-39.9) 01/21/2015  . OSA on CPAP   . Stroke [facility])    a. 3 strokes - last in 1998; "memory problems since"  (03/26/2015)    Past Surgical History:  Procedure Laterality Date  . ABDOMINAL HYSTERECTOMY    . BREAST BIOPSY Left 2008  . CATARACT EXTRACTION, BILATERAL Bilateral   . DILATION AND CURETTAGE OF UTERUS    . EP IMPLANTABLE DEVICE N/A 03/26/2015   MDT single chamber ICD,  Dr. Lovena Le  . LEFT HEART CATHETERIZATION WITH CORONARY ANGIOGRAM N/A 05/07/2014   Procedure: LEFT HEART CATHETERIZATION WITH CORONARY ANGIOGRAM;  Surgeon: Belva Crome, MD;  Location: [facility] CATH LAB;  Service: Cardiovascular;  Laterality: N/A;  . MASTECTOMY Left 2009    There were no vitals filed for this visit.  Subjective Assessment - 07/26/17 1008    Subjective  Pt states that her L foot has been bothering her over the past few days and she has not been walking as much because of the pain.     Pertinent History  CVA - Left PLICA infarct 7/86/7672; remote h/o prior CVAs (most recent in 1998); h/o B knee pain managed with injections    Patient Stated Goals  "walk better"    Currently in Pain?  No/denies    Pain Score  0-No pain    Pain Location  Foot    Pain Orientation  Left;Lower    Pain Frequency  Constant         [facility] PT Assessment - 07/26/17 1016      Bed Mobility   Rolling Right  Supervision/verbal cueing    Rolling Left  Supervision/Verbal cueing  [place] Adult PT Treatment/Exercise - 07/26/17 1016      Bed Mobility   Bed Mobility  Rolling Right;Rolling Left;Right Sidelying to Sit;Left Sidelying to Sit;Supine to Sit;Sit to Supine;Sit to Sidelying Right;Sit to Sidelying Left    Right Sidelying to Sit  Independent    Left Sidelying to Sit  Independent    Sit to Sidelying Right  Minimal Assistance - Patient > 75%    Sit to Sidelying Left  Minimal Assistance - Patient > 75%      Ambulation/Gait   Ambulation/Gait  Yes    Ambulation/Gait Assistance  6: Modified independent (Device/Increase time)    Ambulation/Gait Assistance Details  Tactile cues for glute activation to prevent forward lean onto walker    Gait Pattern  Right foot flat;Left foot flat;Decreased dorsiflexion - right;Decreased weight shift to right;Decreased stance time - right;Decreased step length - left;Step-through pattern;Step-to pattern;Decreased stride length;Antalgic;Poor foot  clearance - left;Poor foot clearance - right;Decreased hip/knee flexion - right;Decreased hip/knee flexion - left Able to maintain step through pattern for a few steps    Ambulation Surface  Indoor    Gait velocity  .94 ft/sec    Pre-Gait Activities  Performed R foot stepping forward/backward 15 reps for 3 sets. Balance bubble and colored disc used as external cues to achieved adequate step length and foot clearance. PT provided tactile cues to proximal hip flexor musculature. Pt required bilateral UE support during exercise.       Knee/Hip Exercises: Aerobic   Nustep  L4 x 6' (B UE/LE)      Knee/Hip Exercises: Seated   Sit to Sand  5 reps with VC to decrease UE use             PT Education - 07/26/17 1455    Education Details  Pt educated on correct posture when using a FWW with emphasis on using the walker for balance instead of support    Person(s) Educated  Patient    Methods  Explanation;Demonstration    Comprehension  Verbalized understanding;Returned demonstration       PT Short Term Goals - 07/15/17 1036      PT SHORT TERM GOAL #1   Title  Independent with initial HEP    Status  Achieved      PT SHORT TERM GOAL #2   Title  Increase gait speed to >/= 1.4 ft /sec to increase safety with limited community ambulation    Status  On-going        PT Long Term Goals - 07/12/17 1204      PT LONG TERM GOAL #1   Title  Independent with ongoing/advanced HEP     Status  On-going      PT LONG TERM GOAL #2   Title  Increase B LE strength to >/= 4/5 to 4/5 for increased ease of mobility    Status  On-going      PT LONG TERM GOAL #3   Title  Pt will complete sit to/from stand transfers w/o UE assist demonstrating good control    Status  On-going      PT LONG TERM GOAL #4   Title  Decrease TUG time to </= 25 sec to reduce risk for falls during transitional movements    Status  On-going      PT LONG TERM GOAL #5   Title  Increase gait speed to >/= 1.8 ft/sec with LRAD  to reduce risk for recurrent falls    Status  On-going  PT LONG TERM GOAL #6   Title  Increase Berg to >/= 40/56 to reduce fall risk    Status  On-going            Plan - 07/26/17 1102    Clinical Impression Statement  Pt tolerated treatment well today with increased fatigue noted towards end of session. Treatment session today focused on gait activities with an emphasis on increased step length with noted improvement in gait pattern over the next few minutes. Pt continues to make improvements in quality of gait pattern, LE strength, and ability to perform transfers independently, but will continue to benefit from physical therapy to progress towards functional goals including increased gait speed and ability to transfer independently.     Clinical Presentation  --    Rehab Potential  Good    PT Treatment/Interventions  Patient/family education;ADLs/Self Care Home Management;Therapeutic exercise;Therapeutic activities;Functional mobility training;Gait training;Balance training;Manual techniques;Passive range of motion;Dry needling;Taping;Cryotherapy;Moist Heat    PT Next Visit Plan  Progress HEP exercises to incorporate gait exercises and WB LE exercises    Consulted and Agree with Plan of Care  Patient       Patient will benefit from skilled therapeutic intervention in order to improve the following deficits and impairments:  Decreased strength, Decreased mobility, Decreased balance, Decreased activity tolerance, Difficulty walking, Abnormal gait, Decreased endurance, Decreased safety awareness, Decreased knowledge of precautions  Visit Diagnosis: Hemiplegia and hemiparesis following cerebral infarction affecting right dominant side (HCC)  Muscle weakness (generalized)  Other abnormalities of gait and mobility  Unsteadiness on feet  Difficulty in walking, not elsewhere classified     Problem List Patient Active Problem List   Diagnosis Date Noted  . Labile blood  pressure   . Hypoalbuminemia due to protein-calorie malnutrition ([place])   . Acute blood loss anemia   . AKI (acute kidney injury) ([place])   . Peripheral edema   . Stage 3 chronic kidney disease ([place])   . Benign essential HTN   . History of CVA with residual deficit   . Cerebrovascular accident (CVA) due to thrombosis of left middle cerebral artery ([place])   . Right leg weakness 04/19/2017  . Right hemiparesis ([place]) 04/19/2017  . 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 Joint pain 12/09/2015  . ICD (implantable cardioverter-defibrillator) in place 03/26/2015  . Syncope 03/06/2015  . Obesity (BMI 30-39.9) 01/21/2015  . Excessive daytime sleepiness 08/04/2014  . OSA (obstructive sleep apnea) 08/04/2014  . Essential hypertension 06/13/2014  . Chronic systolic CHF (congestive heart failure) ([place]) 05/20/2014  . Congestive heart disease ([place])   . Chronic combined systolic and diastolic CHF (congestive heart failure) ([place]) 05/03/2014  . CKD (chronic kidney disease), stage III ([place]) 05/03/2014  . Hx of stroke without residual deficits 05/03/2014    Shirline Frees, SPT 07/26/2017, 6:10 PM  [facility] [address]  [place], [place], [address] Phone: [phone]   Fax:  [phone]  Name: Heydi Swango MRN: [id number] Date of Birth: [birth year]

## 2017-07-28 ENCOUNTER — Other Ambulatory Visit: Payer: Self-pay | Admitting: Internal Medicine

## 2017-07-28 ENCOUNTER — Ambulatory Visit: Payer: Self-pay | Admitting: *Deleted

## 2017-07-28 ENCOUNTER — Other Ambulatory Visit (HOSPITAL_COMMUNITY): Payer: Self-pay

## 2017-07-29 ENCOUNTER — Other Ambulatory Visit: Payer: Self-pay | Admitting: *Deleted

## 2017-07-29 ENCOUNTER — Ambulatory Visit: Payer: PPO

## 2017-07-29 ENCOUNTER — Encounter: Payer: Self-pay | Admitting: *Deleted

## 2017-07-29 DIAGNOSIS — M6281 Muscle weakness (generalized): Secondary | ICD-10-CM

## 2017-07-29 DIAGNOSIS — R262 Difficulty in walking, not elsewhere classified: Secondary | ICD-10-CM

## 2017-07-29 DIAGNOSIS — I69351 Hemiplegia and hemiparesis following cerebral infarction affecting right dominant side: Secondary | ICD-10-CM

## 2017-07-29 DIAGNOSIS — R2681 Unsteadiness on feet: Secondary | ICD-10-CM

## 2017-07-29 DIAGNOSIS — R2689 Other abnormalities of gait and mobility: Secondary | ICD-10-CM

## 2017-07-29 NOTE — Therapy (Signed)
[facility] [address]  [place], [place], [address] Phone: [phone]   Fax:  [phone]  Physical Therapy Treatment  Patient Details  Name: Tara Dunn MRN: [id number] Date of Birth: Aug 09, 1940 Referring Provider: Venancio Poisson, NP   Encounter Date: 07/29/2017  PT End of Session - 07/29/17 1017    Visit Number  6    Number of Visits  16    Date for PT Re-Evaluation  08/26/17    Authorization Type  [employer]    PT Start Time  1010    PT Stop Time  1058    PT Time Calculation (min)  48 min    Activity Tolerance  Patient tolerated treatment well;Patient limited by fatigue    Behavior During Therapy  [facility] for tasks assessed/performed       Past Medical History:  Diagnosis Date  . 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 Lovena Le  . Aortic dilatation ([place])    a. 04/2014 CTA chest w/ incidental finding of distal thoracic Ao enlargement of 3.39 mm - f/u needed 04/2015.  . Arthritis    "all over my body"  . Cancer of left breast ([place]) 2009   s/p L mastectomy and chemo.  . Cardiomyopathy ([place])    a. 04/2014 Echo: EF 25-30%, mod conc LVH, possibl antsept HK, Gr1 DD, mod-sev dil LA.  . CHF (congestive heart failure) ([place])   . CKD (chronic kidney disease), stage III ([place])   . Depression   . GERD (gastroesophageal reflux disease)   . Gout   . Heart murmur   . History of stomach ulcers   . Hypertension    a. Dx @ age 9;  b. 04/2014 admission for HTN emergency.  . Obesity (BMI 30-39.9) 01/21/2015  . OSA on CPAP   . Stroke [facility])    a. 3 strokes - last in 1998; "memory problems since"  (03/26/2015)    Past Surgical History:  Procedure Laterality Date  . ABDOMINAL HYSTERECTOMY    . BREAST BIOPSY Left 2008  . CATARACT EXTRACTION, BILATERAL Bilateral   . DILATION AND CURETTAGE OF UTERUS    . EP IMPLANTABLE DEVICE N/A 03/26/2015   MDT single chamber ICD,  Dr. Lovena Le  . LEFT HEART CATHETERIZATION WITH CORONARY ANGIOGRAM N/A 05/07/2014   Procedure: LEFT HEART CATHETERIZATION WITH CORONARY ANGIOGRAM;  Surgeon: Belva Crome, MD;  Location: [facility] CATH LAB;  Service: Cardiovascular;  Laterality: N/A;  . MASTECTOMY Left 2009    There were no vitals filed for this visit.  Subjective Assessment - 07/29/17 1014    Subjective  Pt. Reporting L knee pain is primary concern today.  Notes she did not eat breakfast, due to not wanting to be late to therapy.      Pertinent History  CVA - Left PLICA infarct 6/62/9476; remote h/o prior CVAs (most recent in 1998); h/o B knee pain managed with injections    Patient Stated Goals  "walk better"    Currently in Pain?  Yes    Pain Score  8     Pain Location  Knee    Pain Orientation  Left;Anterior    Pain Descriptors / Indicators  Aching    Pain Type  Acute pain    Aggravating Factors   walking     Pain Relieving Factors  rest     Multiple Pain Sites  No  [place] Adult PT Treatment/Exercise - 07/29/17 1039      Bed Mobility   Bed Mobility  Rolling Right;Rolling Left;Right Sidelying to Sit;Sit to Supine;Sit to Sidelying Right    Rolling Right  Supervision/verbal cueing    Rolling Left  Supervision/Verbal cueing    Right Sidelying to Sit  Independent    Sit to Sidelying Right  Supervision/Verbal cueing min A x 1 for LE up to table       Ambulation/Gait   Ambulation/Gait  Yes    Ambulation/Gait Assistance  5: Supervision    Ambulation/Gait Assistance Details  tactile cues for consistent RW pacing and less UE reliance on RW    Ambulation Distance (Feet)  90 Feet    Gait Pattern  Right foot flat;Left foot flat;Decreased dorsiflexion - right;Decreased weight shift to right;Decreased stance time - right;Decreased step length - left;Step-through pattern;Step-to pattern;Decreased stride length;Antalgic;Poor foot clearance - left;Poor foot clearance - right;Decreased hip/knee flexion  - right;Decreased hip/knee flexion - left    Ambulation Surface  Indoor    Pre-Gait Activities  Alternating hip flexion march in RW to encourage improved LE clearance x 10 each; alternating "hip hike" in RW to encourage improved LE clearance x 10 resp       Knee/Hip Exercises: Aerobic   Nustep  L4 x 6' (B UE/LE)      Knee/Hip Exercises: Standing   Hip Flexion  --    Hip Flexion Limitations  --    Hip Extension  Right;Left;10 reps;Stengthening cues for slow movement pattern    Extension Limitations  Alternating kick-back in RW    Other Standing Knee Exercises  --      Knee/Hip Exercises: Seated   Sit to Sand  2 sets;5 reps foam under L LE; 1 UE pushoff from airex on mat table       Knee/Hip Exercises: Supine   Bridges with [employer]  Both;10 reps limited motion  -  cues required for slow movement     Other Supine Knee/Hip Exercises  B hip flexion with heels on peanut p-ball x 15 reps     Other Supine Knee/Hip Exercises  adduction ball squeeze 5" x 15 reps              PT Education - 07/29/17 1151    Education provided  Yes    Education Details  HEP update     Person(s) Educated  Patient    Methods  Explanation;Verbal cues;Handout    Comprehension  Verbalized understanding;Returned demonstration;Verbal cues required;Need further instruction       PT Short Term Goals - 07/15/17 1036      PT SHORT TERM GOAL #1   Title  Independent with initial HEP    Status  Achieved      PT SHORT TERM GOAL #2   Title  Increase gait speed to >/= 1.4 ft /sec to increase safety with limited community ambulation    Status  On-going        PT Long Term Goals - 07/12/17 1204      PT LONG TERM GOAL #1   Title  Independent with ongoing/advanced HEP     Status  On-going      PT LONG TERM GOAL #2   Title  Increase B LE strength to >/= 4/5 to 4/5 for increased ease of mobility    Status  On-going      PT LONG TERM GOAL #3   Title  Pt will complete sit to/from stand transfers  w/o UE  assist demonstrating good control    Status  On-going      PT LONG TERM GOAL #4   Title  Decrease TUG time to </= 25 sec to reduce risk for falls during transitional movements    Status  On-going      PT LONG TERM GOAL #5   Title  Increase gait speed to >/= 1.8 ft/sec with LRAD to reduce risk for recurrent falls    Status  On-going      PT LONG TERM GOAL #6   Title  Increase Berg to >/= 40/56 to reduce fall risk    Status  On-going            Plan - 07/29/17 1019    Clinical Impression Statement  Karren seen to start session ambulating with heavy RW UE support and slow pacing with intermittent RW stop.  Much improved gait mechanics with less UE reliance following gait training and cueing for upright posture.  Tolerated increased focus on standing strengthening activities today focused on improving gait mechanics and LE clearance.  HEP updated with weight bearing proximal hip strengthening today.  Will monitor response at upcoming visit.      PT Treatment/Interventions  Patient/family education;ADLs/Self Care Home Management;Therapeutic exercise;Therapeutic activities;Functional mobility training;Gait training;Balance training;Manual techniques;Passive range of motion;Dry needling;Taping;Cryotherapy;Moist Heat    Consulted and Agree with Plan of Care  Patient       Patient will benefit from skilled therapeutic intervention in order to improve the following deficits and impairments:  Decreased strength, Decreased mobility, Decreased balance, Decreased activity tolerance, Difficulty walking, Abnormal gait, Decreased endurance, Decreased safety awareness, Decreased knowledge of precautions  Visit Diagnosis: Hemiplegia and hemiparesis following cerebral infarction affecting right dominant side (HCC)  Muscle weakness (generalized)  Other abnormalities of gait and mobility  Unsteadiness on feet  Difficulty in walking, not elsewhere classified     Problem List Patient Active  Problem List   Diagnosis Date Noted  . Labile blood pressure   . Hypoalbuminemia due to protein-calorie malnutrition ([place])   . Acute blood loss anemia   . AKI (acute kidney injury) ([place])   . Peripheral edema   . Stage 3 chronic kidney disease ([place])   . Benign essential HTN   . History of CVA with residual deficit   . Cerebrovascular accident (CVA) due to thrombosis of left middle cerebral artery ([place])   . Right leg weakness 04/19/2017  . Right hemiparesis ([place]) 04/19/2017  . 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 Joint pain 12/09/2015  . ICD (implantable cardioverter-defibrillator) in place 03/26/2015  . Syncope 03/06/2015  . Obesity (BMI 30-39.9) 01/21/2015  . Excessive daytime sleepiness 08/04/2014  . OSA (obstructive sleep apnea) 08/04/2014  . Essential hypertension 06/13/2014  . Chronic systolic CHF (congestive heart failure) ([place]) 05/20/2014  . Congestive heart disease ([place])   . Chronic combined systolic and diastolic CHF (congestive heart failure) ([place]) 05/03/2014  . CKD (chronic kidney disease), stage III ([place]) 05/03/2014  . Hx of stroke without residual deficits 05/03/2014    Bess Harvest, PTA 07/29/17 12:10 PM   [facility] [address]  [place], [place], [address] Phone: [phone]   Fax:  [phone]  Name: Jolean Madariaga MRN: [id number] Date of Birth: [birth year]

## 2017-07-29 NOTE — Patient Outreach (Signed)
[facility]) Care Management  07/29/2017  Tara Dunn Nov 24, 1940 [id number]    Telephone Assessment  RN attempted outreach call today to inquire on pt's ongoing management of care however unable to reach the caregiver. RN left a HIPAA approved voice message requesting a call back. Will further inquired at that time on pt's ongoing management of care and review the plan of care as generated.  [name], RN Care Management Coordinator [place] Office [phone]

## 2017-07-29 NOTE — Patient Outreach (Signed)
[facility]) Care Management  07/29/2017  Tara Dunn 06-30-1940 [id number]    Telephone Assessment (Case Closure)  RN spoke with pt's daughter [name] who updated RN on pt's progress. States pt continue to attend receive out patient PT with good tolerance however some days complains about her GOUT. MD is aware and pt continue to take her medication. RN discussed all the goals and interventions pt has accomplished over the months and continue to encouraged ongoing adherence to avoid re-entering the hospital. Discuss the FAST and early prevention measures along with intervention to avoid serious residual affects for acute strokes. Daughter verbalized an understanding and appreciative for the assistance and follow up call over the months.  Daughter feels at this time pt is doing well enough to be weaned from the program but is aware how to contact [facility] for further consults. In additional RN has alert daughter that pt's provider will also e updated on pt's disposition with [facility] services.  Again daughter expressed gratitude on [facility] services over the last few months. Case will be closed at this time via [facility] services.  [name], RN Care Management Coordinator [place] Office [phone]

## 2017-07-30 NOTE — Progress Notes (Signed)
Paramedicine Encounter    Patient ID: Tara Dunn, female    DOB: 1940-07-22, 77 y.o.   MRN: [id number]    Patient Care Team: Fanny Bien, MD as PCP - General (Family Medicine)  Patient Active Problem List   Diagnosis Date Noted  . Labile blood pressure   . Hypoalbuminemia due to protein-calorie malnutrition ([place])   . Acute blood loss anemia   . AKI (acute kidney injury) ([place])   . Peripheral edema   . Stage 3 chronic kidney disease ([place])   . Benign essential HTN   . History of CVA with residual deficit   . Cerebrovascular accident (CVA) due to thrombosis of left middle cerebral artery ([place])   . Right leg weakness 04/19/2017  . Right hemiparesis ([place]) 04/19/2017  . 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 Joint pain 12/09/2015  . ICD (implantable cardioverter-defibrillator) in place 03/26/2015  . Syncope 03/06/2015  . Obesity (BMI 30-39.9) 01/21/2015  . Excessive daytime sleepiness 08/04/2014  . OSA (obstructive sleep apnea) 08/04/2014  . Essential hypertension 06/13/2014  . Chronic systolic CHF (congestive heart failure) ([place]) 05/20/2014  . Congestive heart disease ([place])   . Chronic combined systolic and diastolic CHF (congestive heart failure) ([place]) 05/03/2014  . CKD (chronic kidney disease), stage III ([place]) 05/03/2014  . Hx of stroke without residual deficits 05/03/2014    Current Outpatient Medications:  .  allopurinol (ZYLOPRIM) 100 MG tablet, Take 2 tablets (200 mg total) by mouth daily., Disp: 30 tablet, Rfl: 0 .  atorvastatin (LIPITOR) 40 MG tablet, TAKE 1 TABLET(40 MG) BY MOUTH DAILY, Disp: 90 tablet, Rfl: 2 .  atorvastatin (LIPITOR) 80 MG tablet, Take 1 tablet (80 mg total) by mouth daily at 6 PM., Disp: 30 tablet, Rfl: 3 .  carvedilol (COREG) 25 MG tablet, Take 1 tablet (25 mg total) by mouth 2 (two) times daily., Disp: 60 tablet, Rfl: 0 .  carvedilol (COREG) 25 MG tablet, TAKE 1 TABLET(25 MG) BY MOUTH TWICE DAILY, Disp: 180 tablet, Rfl: 0 .   clopidogrel (PLAVIX) 75 MG tablet, Take 1 tablet (75 mg total) by mouth daily., Disp: 30 tablet, Rfl: 0 .  furosemide (LASIX) 20 MG tablet, Take 1 tablet (20 mg total) by mouth daily. Take EXTRA 20 mg tablet once daily AS NEEDED for weight gain 3 lbs in 24 hrs or 5 lbs in 1 week., Disp: 180 tablet, Rfl: 3 .  isosorbide-hydrALAZINE (BIDIL) 20-37.5 MG tablet, Take 2 tablets by mouth 2 (two) times daily., Disp: , Rfl:  .  omeprazole (PRILOSEC) 40 MG capsule, Take 1 capsule (40 mg total) by mouth daily., Disp: 30 capsule, Rfl: 0 .  senna-docusate (SENOKOT-S) 8.6-50 MG tablet, Take 1 tablet by mouth at bedtime as needed for mild constipation. (Patient not taking: Reported on 07/06/2017), Disp: , Rfl:  Allergies  Allergen Reactions  . Shellfish Allergy Hives     Social History   Socioeconomic History  . Marital status: Widowed    Spouse name: Not on file  . Number of children: Not on file  . Years of education: Not on file  . Highest education level: Not on file  Occupational History  . Not on file  Social Needs  . Financial resource strain: Not on file  . Food insecurity:    Worry: Not on file    Inability: Not on file  . Transportation needs:    Medical: Not on file    Non-medical: Not on file  Tobacco Use  . Smoking status: Former  Smoker    Packs/day: 0.50    Years: 40.00    Pack years: 20.00    Types: Cigarettes  . Smokeless tobacco: Never Used  . Tobacco comment: Quit in 2009.  Substance and Sexual Activity  . Alcohol use: No    Alcohol/week: 0.0 oz    Comment: "drank occasionally when I was young; last drink was in the late 1990s"  . Drug use: No  . Sexual activity: Never  Lifestyle  . Physical activity:    Days per week: Not on file    Minutes per session: Not on file  . Stress: Not on file  Relationships  . Social connections:    Talks on phone: Not on file    Gets together: Not on file    Attends religious service: Not on file    Active member of club or  organization: Not on file    Attends meetings of clubs or organizations: Not on file    Relationship status: Not on file  . Intimate partner violence:    Fear of current or ex partner: Not on file    Emotionally abused: Not on file    Physically abused: Not on file    Forced sexual activity: Not on file  Other Topics Concern  . Not on file  Social History Narrative   Lives alone in an independent living facility.  Education: 10th grade.     Retired from [employer] work.  Moved here from [place] in 2015.    Physical Exam  Pulmonary/Chest: Effort normal. She has no wheezes. She has no rales.  Abdominal: Soft. There is no tenderness. There is no guarding.  Musculoskeletal: She exhibits no edema.  Skin: Skin is warm and dry. She is not diaphoretic.        Future Appointments  Date Time Provider [place]  08/02/2017 10:15 AM Percival Spanish, PT [facility]  08/05/2017 10:15 AM Bess Harvest, PTA [facility]  08/09/2017 10:15 AM Percival Spanish, PT [facility]  08/12/2017 10:15 AM Percival Spanish, PT [facility]  08/16/2017 10:15 AM Bess Harvest, PTA [facility]  08/19/2017 10:15 AM Percival Spanish, PT [facility]  08/23/2017 10:15 AM Bess Harvest, PTA [facility]  08/26/2017 10:15 AM Percival Spanish, PT [facility]  09/09/2017 10:00 AM [facility] PA/NP [facility] None  10/24/2017  2:45 PM Venancio Poisson, NP [facility] None    ATF [name] x4 sitting in the garage.  Pt stated that she feels ok today, she thinks that she may miss her physical therapy appointment tomorrow due to transportation issues.  Pt stated that the bottom of her left foot has been hurting; pt had the same pain last week.  Pt took all of her meds for this week/she was given a extra pill box to hold her for two weeks now that she has been taking her meds regularly.  rx bottles verified and pill box (2) filled.    pts pcp called and the nurse advised pt to take tylenol  BP 136/90 (BP Location: Right Arm,  Patient Position: Sitting, Cuff Size: Large)   Pulse 66   SpO2 98%      Tara Dunn, EMT Paramedic 07/30/2017    ACTION: Home visit completed

## 2017-08-02 ENCOUNTER — Ambulatory Visit: Payer: PPO | Admitting: Physical Therapy

## 2017-08-02 ENCOUNTER — Telehealth (HOSPITAL_COMMUNITY): Payer: Self-pay | Admitting: *Deleted

## 2017-08-02 DIAGNOSIS — M6281 Muscle weakness (generalized): Secondary | ICD-10-CM

## 2017-08-02 DIAGNOSIS — R2681 Unsteadiness on feet: Secondary | ICD-10-CM

## 2017-08-02 DIAGNOSIS — I63312 Cerebral infarction due to thrombosis of left middle cerebral artery: Secondary | ICD-10-CM

## 2017-08-02 DIAGNOSIS — I69351 Hemiplegia and hemiparesis following cerebral infarction affecting right dominant side: Secondary | ICD-10-CM

## 2017-08-02 DIAGNOSIS — R262 Difficulty in walking, not elsewhere classified: Secondary | ICD-10-CM

## 2017-08-02 DIAGNOSIS — R2689 Other abnormalities of gait and mobility: Secondary | ICD-10-CM

## 2017-08-02 NOTE — Telephone Encounter (Signed)
Patient enrolled in [place], has $1,000 available for [place] from 08/25/17 through 08/25/18.

## 2017-08-02 NOTE — Therapy (Addendum)
[facility] [address]  [place], [place], [address] Phone: [phone]   Fax:  [phone]  Physical Therapy Treatment  Patient Details  Name: Tara Dunn MRN: [id number] Date of Birth: [birth year] Referring Provider: Venancio Poisson, NP   Encounter Date: 08/02/2017  PT End of Session - 08/02/17 1038    Visit Number  7    Number of Visits  16    Date for PT Re-Evaluation  08/26/17    Authorization Type  [employer]    PT Start Time  1019    PT Stop Time  1059    PT Time Calculation (min)  40 min    Equipment Utilized During Treatment  Gait belt    Activity Tolerance  Patient tolerated treatment well;Patient limited by fatigue;Patient limited by pain    Behavior During Therapy  WFL for tasks assessed/performed       Past Medical History:  Diagnosis Date  . 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 Lovena Le  . Aortic dilatation ([place])    a. 04/2014 CTA chest w/ incidental finding of distal thoracic Ao enlargement of 3.39 mm - f/u needed 04/2015.  . Arthritis    "all over my body"  . Cancer of left breast ([place]) 2009   s/p L mastectomy and chemo.  . Cardiomyopathy ([place])    a. 04/2014 Echo: EF 25-30%, mod conc LVH, possibl antsept HK, Gr1 DD, mod-sev dil LA.  . CHF (congestive heart failure) ([place])   . CKD (chronic kidney disease), stage III ([place])   . Depression   . GERD (gastroesophageal reflux disease)   . Gout   . Heart murmur   . History of stomach ulcers   . Hypertension    a. Dx @ age 89;  b. 04/2014 admission for HTN emergency.  . Obesity (BMI 30-39.9) 01/21/2015  . OSA on CPAP   . Stroke [facility])    a. 3 strokes - last in 1998; "memory problems since"  (03/26/2015)    Past Surgical History:  Procedure Laterality Date  . ABDOMINAL HYSTERECTOMY    . BREAST BIOPSY Left 2008  . CATARACT EXTRACTION, BILATERAL Bilateral   . DILATION AND CURETTAGE OF  UTERUS    . EP IMPLANTABLE DEVICE N/A 03/26/2015   MDT single chamber ICD, Dr. Lovena Le  . LEFT HEART CATHETERIZATION WITH CORONARY ANGIOGRAM N/A 05/07/2014   Procedure: LEFT HEART CATHETERIZATION WITH CORONARY ANGIOGRAM;  Surgeon: Belva Crome, MD;  Location: [facility] CATH LAB;  Service: Cardiovascular;  Laterality: N/A;  . MASTECTOMY Left 2009    There were no vitals filed for this visit.  Subjective Assessment - 08/02/17 1027    Subjective  Pt states that she does not have pain except for in the L foot secondary to gout. Has an appointment with the MD on 08/16/2017 for the L foot/gout. Pt states that her legs feel heavy today and that she continues to have difficulty picking up the R leg.     Pertinent History  CVA - Left PLICA infarct 07/22/4313; remote h/o prior CVAs (most recent in 1998); h/o B knee pain managed with injections    Currently in Pain?  Yes    Pain Score  8     Pain Location  Foot    Pain Orientation  Left    Pain Descriptors / Indicators  Throbbing    Pain Type  Acute pain    Pain Onset  In the past  7 days    Pain Frequency  Constant                       [facility] Adult PT Treatment/Exercise - 08/02/17 1026      Transfers   Transfers  Sit to Stand    Sit to Stand  From elevated surface;With upper extremity assist;Without upper extremity assist 10 reps    Comments  Standing with one UE support; sitting onto Airex pad with no UE      Ambulation/Gait   Ambulation/Gait  Yes    Ambulation/Gait Assistance  6: Modified independent (Device/Increase time);5: Supervision    Ambulation/Gait Assistance Details  Verbal and tactile cues for adequate R step length and heel strike with R LE    Ambulation Distance (Feet)  90 Feet    Assistive device  Rolling walker    Gait Pattern  Decreased step length - right;Decreased stance time - left;Decreased hip/knee flexion - right;Right foot flat;Poor foot clearance - right    Ambulation Surface  Indoor;Level    Gait Comments   Masking tape placed at appropriate intervals on the floor; balance pebbles used as external cue for foot clearance      Knee/Hip Exercises: Aerobic   Nustep  L5 x 6 min (B UE/LE)      Knee/Hip Exercises: Seated   Long Arc Quad  Right;Strengthening;Weights    Long Arc Quad Weight  4 lbs.    Marching  10 reps;Right;AAROM    Marching Limitations  PT provided mod assist throughout motion    Sit to Sand  -- 2 UE support 5 reps; 1 UE support 5 reps      Ambulation   Ambulation/Gait Assistance Details  Tactile cues for initiation;Tactile cues for placement;Visual cues/gestures for sequencing;Verbal cues for sequencing;Verbal cues for gait pattern      OTHER   Stand Pivot Transfer Details  Verbal cues for gait pattern;Verbal cues for technique             PT Education - 08/02/17 1156    Education provided  Yes    Education Details  Pt family member educated on proper gait mechanics and step length. Instructed on how to facilitate these at home and while walking in the community.     Person(s) Educated  Patient;Child(ren)    Methods  Explanation;Demonstration    Comprehension  Verbalized understanding       PT Short Term Goals - 07/15/17 1036      PT SHORT TERM GOAL #1   Title  Independent with initial HEP    Status  Achieved      PT SHORT TERM GOAL #2   Title  Increase gait speed to >/= 1.4 ft /sec to increase safety with limited community ambulation    Status  On-going        PT Long Term Goals - 07/12/17 1204      PT LONG TERM GOAL #1   Title  Independent with ongoing/advanced HEP     Status  On-going      PT LONG TERM GOAL #2   Title  Increase B LE strength to >/= 4/5 to 4/5 for increased ease of mobility    Status  On-going      PT LONG TERM GOAL #3   Title  Pt will complete sit to/from stand transfers w/o UE assist demonstrating good control    Status  On-going      PT LONG TERM GOAL #4  Title  Decrease TUG time to </= 25 sec to reduce risk for falls  during transitional movements    Status  On-going      PT LONG TERM GOAL #5   Title  Increase gait speed to >/= 1.8 ft/sec with LRAD to reduce risk for recurrent falls    Status  On-going      PT LONG TERM GOAL #6   Title  Increase Berg to >/= 40/56 to reduce fall risk    Status  On-going            Plan - 08/02/17 1154    Clinical Impression Statement  Pt tolerated gait activities well with carryover noted as she was leaving the clinic. Today focused on increasing R step length and height of step using external cues to facilitate. Pt required moderate verbal cueing to achieve adequate length and height, and cueing was tapered as session progressed. Pt continues to demonstrate difficulty with LE strength and fatiguing quickly, which are contributing to her gait abnormalities. Pt will continue to benefit from physical therapy to address these impairments and continue to progress towards her functional goals.     Rehab Potential  Good    PT Treatment/Interventions  Patient/family education;ADLs/Self Care Home Management;Therapeutic exercise;Therapeutic activities;Functional mobility training;Gait training;Balance training;Manual techniques;Passive range of motion;Dry needling;Taping;Cryotherapy;Moist Heat    PT Next Visit Plan  Progress HEP exercises to incorporate gait exercises and WB LE exercises; work on floor to chair transfer    Consulted and Agree with Plan of Care  Patient       Patient will benefit from skilled therapeutic intervention in order to improve the following deficits and impairments:  Decreased strength, Decreased mobility, Decreased balance, Decreased activity tolerance, Difficulty walking, Abnormal gait, Decreased endurance, Decreased safety awareness, Decreased knowledge of precautions  Visit Diagnosis: Hemiplegia and hemiparesis following cerebral infarction affecting right dominant side (HCC)  Muscle weakness (generalized)  Other abnormalities of gait and  mobility  Unsteadiness on feet  Difficulty in walking, not elsewhere classified  Cerebrovascular accident (CVA) due to thrombosis of left middle cerebral artery [facility])     Problem List Patient Active Problem List   Diagnosis Date Noted  . Labile blood pressure   . Hypoalbuminemia due to protein-calorie malnutrition ([place])   . Acute blood loss anemia   . AKI (acute kidney injury) ([place])   . Peripheral edema   . Stage 3 chronic kidney disease ([place])   . Benign essential HTN   . History of CVA with residual deficit   . Cerebrovascular accident (CVA) due to thrombosis of left middle cerebral artery ([place])   . Right leg weakness 04/19/2017  . Right hemiparesis ([place]) 04/19/2017  . 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 Joint pain 12/09/2015  . ICD (implantable cardioverter-defibrillator) in place 03/26/2015  . Syncope 03/06/2015  . Obesity (BMI 30-39.9) 01/21/2015  . Excessive daytime sleepiness 08/04/2014  . OSA (obstructive sleep apnea) 08/04/2014  . Essential hypertension 06/13/2014  . Chronic systolic CHF (congestive heart failure) ([place]) 05/20/2014  . Congestive heart disease ([place])   . Chronic combined systolic and diastolic CHF (congestive heart failure) ([place]) 05/03/2014  . CKD (chronic kidney disease), stage III ([place]) 05/03/2014  . Hx of stroke without residual deficits 05/03/2014    Percival Spanish, SPT 08/02/2017, 3:50 PM  [facility] [address]  [place], [place], [address] Phone: [phone]   Fax:  [phone]  Name: Tara Dunn MRN: [id number] Date  of Birth: Jun 24, 1940

## 2017-08-04 ENCOUNTER — Other Ambulatory Visit (HOSPITAL_COMMUNITY): Payer: Self-pay

## 2017-08-04 NOTE — Progress Notes (Signed)
Paramedicine Encounter    Patient ID: Tara Dunn, female    DOB: 1940/03/19, 77 y.o.   MRN: [id number]    Patient Care Team: Fanny Bien, MD as PCP - General (Family Medicine)  Patient Active Problem List   Diagnosis Date Noted  . Labile blood pressure   . Hypoalbuminemia due to protein-calorie malnutrition ([place])   . Acute blood loss anemia   . AKI (acute kidney injury) ([place])   . Peripheral edema   . Stage 3 chronic kidney disease ([place])   . Benign essential HTN   . History of CVA with residual deficit   . Cerebrovascular accident (CVA) due to thrombosis of left middle cerebral artery ([place])   . Right leg weakness 04/19/2017  . Right hemiparesis ([place]) 04/19/2017  . Thalamic infarct, acute ([place]) 04/19/2017  . Joint pain 12/09/2015  . ICD (implantable cardioverter-defibrillator) in place 03/26/2015  . Syncope 03/06/2015  . Obesity (BMI 30-39.9) 01/21/2015  . Excessive daytime sleepiness 08/04/2014  . OSA (obstructive sleep apnea) 08/04/2014  . Essential hypertension 06/13/2014  . Chronic systolic CHF (congestive heart failure) ([place]) 05/20/2014  . Congestive heart disease ([place])   . Chronic combined systolic and diastolic CHF (congestive heart failure) ([place]) 05/03/2014  . CKD (chronic kidney disease), stage III ([place]) 05/03/2014  . Hx of stroke without residual deficits 05/03/2014    Current Outpatient Medications:  .  allopurinol (ZYLOPRIM) 100 MG tablet, Take 2 tablets (200 mg total) by mouth daily., Disp: 30 tablet, Rfl: 0 .  atorvastatin (LIPITOR) 40 MG tablet, TAKE 1 TABLET(40 MG) BY MOUTH DAILY, Disp: 90 tablet, Rfl: 2 .  atorvastatin (LIPITOR) 80 MG tablet, Take 1 tablet (80 mg total) by mouth daily at 6 PM., Disp: 30 tablet, Rfl: 3 .  carvedilol (COREG) 25 MG tablet, Take 1 tablet (25 mg total) by mouth 2 (two) times daily., Disp: 60 tablet, Rfl: 0 .  carvedilol (COREG) 25 MG tablet, TAKE 1 TABLET(25 MG) BY MOUTH TWICE DAILY, Disp: 180 tablet, Rfl: 0 .   clopidogrel (PLAVIX) 75 MG tablet, Take 1 tablet (75 mg total) by mouth daily., Disp: 30 tablet, Rfl: 0 .  furosemide (LASIX) 20 MG tablet, Take 1 tablet (20 mg total) by mouth daily. Take EXTRA 20 mg tablet once daily AS NEEDED for weight gain 3 lbs in 24 hrs or 5 lbs in 1 week., Disp: 180 tablet, Rfl: 3 .  isosorbide-hydrALAZINE (BIDIL) 20-37.5 MG tablet, Take 2 tablets by mouth 2 (two) times daily., Disp: , Rfl:  .  omeprazole (PRILOSEC) 40 MG capsule, Take 1 capsule (40 mg total) by mouth daily., Disp: 30 capsule, Rfl: 0 .  senna-docusate (SENOKOT-S) 8.6-50 MG tablet, Take 1 tablet by mouth at bedtime as needed for mild constipation. (Patient not taking: Reported on 07/06/2017), Disp: , Rfl:  Allergies  Allergen Reactions  . Shellfish Allergy Hives     Social History   Socioeconomic History  . Marital status: Widowed    Spouse name: Not on file  . Number of children: Not on file  . Years of education: Not on file  . Highest education level: Not on file  Occupational History  . Not on file  Social Needs  . Financial resource strain: Not on file  . Food insecurity:    Worry: Not on file    Inability: Not on file  . Transportation needs:    Medical: Not on file    Non-medical: Not on file  Tobacco Use  . Smoking status: Former  Smoker    Packs/day: 0.50    Years: 40.00    Pack years: 20.00    Types: Cigarettes  . Smokeless tobacco: Never Used  . Tobacco comment: Quit in 2009.  Substance and Sexual Activity  . Alcohol use: No    Alcohol/week: 0.0 oz    Comment: "drank occasionally when I was young; last drink was in the late 1990s"  . Drug use: No  . Sexual activity: Never  Lifestyle  . Physical activity:    Days per week: Not on file    Minutes per session: Not on file  . Stress: Not on file  Relationships  . Social connections:    Talks on phone: Not on file    Gets together: Not on file    Attends religious service: Not on file    Active member of club or  organization: Not on file    Attends meetings of clubs or organizations: Not on file    Relationship status: Not on file  . Intimate partner violence:    Fear of current or ex partner: Not on file    Emotionally abused: Not on file    Physically abused: Not on file    Forced sexual activity: Not on file  Other Topics Concern  . Not on file  Social History Narrative   Lives alone in an independent living facility.  Education: 10th grade.     Retired from [employer] work.  Moved here from [place] in 2015.    Physical Exam  Pulmonary/Chest: Effort normal. No respiratory distress.  Abdominal: Soft.  Musculoskeletal: She exhibits no edema.  Skin: Skin is warm and dry.        Future Appointments  Date Time Provider [place]  08/05/2017 10:15 AM Bess Harvest, PTA [facility]  08/09/2017 10:15 AM Percival Spanish, PT [facility]  08/12/2017 10:15 AM Percival Spanish, PT [facility]  08/16/2017 10:15 AM Bess Harvest, PTA [facility]  08/19/2017 10:15 AM Percival Spanish, PT [facility]  08/23/2017 10:15 AM Bess Harvest, PTA [facility]  08/26/2017 10:15 AM Percival Spanish, PT [facility]  09/09/2017 10:00 AM [facility] PA/NP [facility] None  10/24/2017  2:45 PM Venancio Poisson, NP [facility] None    ATF [name] x4 sitting in her room; c/o foot pain. Pt has chronic pain in her foot. Pt was charged $60 for bidil today/ pharmacy stated that she would be refunded due to pt being part of the [employer].  Pill box verified she has one more left until next week. Pt took a extra 20 mg lasix over the weekend due to weight gain per her daughter.  BP 110/62 (BP Location: Right Arm, Patient Position: Sitting, Cuff Size: Large)   Pulse 67   Resp 16   Wt 220 lb 3.2 oz (99.9 kg)   SpO2 97%   BMI 39.01 kg/m   Weight yesterday- didn't weigh Last visit weight-218    Hiawatha Dressel, EMT Paramedic 08/04/2017    ACTION: Home visit completed

## 2017-08-05 ENCOUNTER — Ambulatory Visit: Payer: PPO

## 2017-08-09 ENCOUNTER — Ambulatory Visit: Payer: PPO | Attending: Adult Health | Admitting: Physical Therapy

## 2017-08-09 DIAGNOSIS — R2681 Unsteadiness on feet: Secondary | ICD-10-CM | POA: Insufficient documentation

## 2017-08-09 DIAGNOSIS — I69351 Hemiplegia and hemiparesis following cerebral infarction affecting right dominant side: Secondary | ICD-10-CM

## 2017-08-09 DIAGNOSIS — R262 Difficulty in walking, not elsewhere classified: Secondary | ICD-10-CM | POA: Insufficient documentation

## 2017-08-09 DIAGNOSIS — I63312 Cerebral infarction due to thrombosis of left middle cerebral artery: Secondary | ICD-10-CM | POA: Insufficient documentation

## 2017-08-09 DIAGNOSIS — R2689 Other abnormalities of gait and mobility: Secondary | ICD-10-CM

## 2017-08-09 DIAGNOSIS — M6281 Muscle weakness (generalized): Secondary | ICD-10-CM | POA: Diagnosis not present

## 2017-08-09 NOTE — Therapy (Addendum)
[facility] [address]  [place], [place], [address] Phone: [phone]   Fax:  [phone]  Physical Therapy Treatment  Patient Details  Name: Tara Dunn MRN: [id number] Date of Birth: 06/22/1940 Referring Provider: Venancio Poisson, NP   Encounter Date: 08/09/2017  PT End of Session - 08/09/17 1311    Visit Number  8    Number of Visits  16    Date for PT Re-Evaluation  08/26/17    Authorization Type  [employer]    PT Start Time  1014    PT Stop Time  1103    PT Time Calculation (min)  49 min    Equipment Utilized During Treatment  Gait belt    Activity Tolerance  Patient tolerated treatment well;Patient limited by fatigue;Patient limited by pain    Behavior During Therapy  WFL for tasks assessed/performed       Past Medical History:  Diagnosis Date  . 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 Lovena Le  . Aortic dilatation ([place])    a. 04/2014 CTA chest w/ incidental finding of distal thoracic Ao enlargement of 3.39 mm - f/u needed 04/2015.  . Arthritis    "all over my body"  . Cancer of left breast ([place]) 2009   s/p L mastectomy and chemo.  . Cardiomyopathy ([place])    a. 04/2014 Echo: EF 25-30%, mod conc LVH, possibl antsept HK, Gr1 DD, mod-sev dil LA.  . CHF (congestive heart failure) ([place])   . CKD (chronic kidney disease), stage III ([place])   . Depression   . GERD (gastroesophageal reflux disease)   . Gout   . Heart murmur   . History of stomach ulcers   . Hypertension    a. Dx @ age 35;  b. 04/2014 admission for HTN emergency.  . Obesity (BMI 30-39.9) 01/21/2015  . OSA on CPAP   . Stroke [facility])    a. 3 strokes - last in 1998; "memory problems since"  (03/26/2015)    Past Surgical History:  Procedure Laterality Date  . ABDOMINAL HYSTERECTOMY    . BREAST BIOPSY Left 2008  . CATARACT EXTRACTION, BILATERAL Bilateral   . DILATION AND CURETTAGE OF  UTERUS    . EP IMPLANTABLE DEVICE N/A 03/26/2015   MDT single chamber ICD, Dr. Lovena Le  . LEFT HEART CATHETERIZATION WITH CORONARY ANGIOGRAM N/A 05/07/2014   Procedure: LEFT HEART CATHETERIZATION WITH CORONARY ANGIOGRAM;  Surgeon: Belva Crome, MD;  Location: [facility] C[facility] LAB;  Service: Cardiovascular;  Laterality: N/A;  . MASTECTOMY Left 2009    There were no vitals filed for this visit.  Subjective Assessment - 08/09/17 1309    Subjective  Pt states that she is well today and has been doing HEP exercises at home with her granddaughter's assistance. Reports that her L knee is continuing to bother her when she walks and while standing from sitting.     Pertinent History  CVA - Left PLICA infarct 4/85/4627; remote h/o prior CVAs (most recent in 1998); h/o B knee pain managed with injections    Patient Stated Goals  "walk better"                       [facility] Adult PT Treatment/Exercise - 08/09/17 0001      Bed Mobility   Left Sidelying to Sit  Minimal Assistance - Patient >75%    Sit to Sidelying Left  Independent  Ambulation/Gait   Ambulation/Gait  Yes    Ambulation/Gait Assistance  6: Modified independent (Device/Increase time)    Ambulation/Gait Assistance Details  VC and TC for adequate step length on R and adequate foot clearance.     Ambulation Distance (Feet)  50 Feet    Assistive device  Rolling walker    Gait Pattern  Decreased stance time - left;Decreased step length - left;Decreased stride length;Decreased hip/knee flexion - right;Decreased hip/knee flexion - left;Lateral hip instability;Lateral trunk lean to right;Decreased step length - right;Decreased arm swing - left;Decreased arm swing - right;Decreased dorsiflexion - left;Decreased dorsiflexion - right    Ambulation Surface  Indoor;Level    Gait Comments  Masking tape at 2 ft intervals as EC to increase step length over 8 ft pathway. 4 x 8 ft with VC to maintain step length and good clearance of R LE       Neuro Re-ed    Neuro Re-ed Details   Supine: Supine; L knee D2 flexion Replication of Repeated Contractions at end range flexion with TC for hip flexion, ER, and L foot DF. 5 reps before ceased secondary to fatigue.       Knee/Hip Exercises: Aerobic   Nustep  L4 x 8 min (B UE/LE)          Balance Exercises - 08/09/17 1347      Balance Exercises: Standing   Step Over Hurdles / Cones  Navigating walker around 6 cones and knocking them down/setting them back up with R LE. Pt required min assist to maintain balance during SLS.      Balance Exercises: Seated   Static Sitting  Eyes opened;Upper extremity support - 1    Dynamic Sitting  Eyes opened;Upper extremity support - 2;Reaching outside base of support      Balance Exercises: Seated   Static Sitting Limitations  Reaching to limit of stability for cones x 10; Airex pad    Dynamic Sitting Limitations  Pt fatigued very quicky and resulted to 2 UE support for stability between reps          PT Short Term Goals - 07/15/17 1036      PT SHORT TERM GOAL #1   Title  Independent with initial HEP    Status  Achieved      PT SHORT TERM GOAL #2   Title  Increase gait speed to >/= 1.4 ft /sec to increase safety with limited community ambulation    Status  On-going        PT Long Term Goals - 08/09/17 1323      PT LONG TERM GOAL #1   Title  Independent with ongoing/advanced HEP     Status  On-going    Target Date  08/26/17      PT LONG TERM GOAL #2   Title  Increase B LE strength to >/= 4/5 to 4/5 for increased ease of mobility    Status  On-going    Target Date  08/26/17      PT LONG TERM GOAL #3   Title  Pt will complete sit to/from stand transfers w/o UE assist demonstrating good control    Status  On-going    Target Date  08/26/17      PT LONG TERM GOAL #4   Title  Decrease TUG time to </= 25 sec to reduce risk for falls during transitional movements    Status  On-going    Target Date  08/26/17      PT  LONG TERM  GOAL #5   Title  Increase gait speed to >/= 1.8 ft/sec with LRAD to reduce risk for recurrent falls    Status  On-going    Target Date  08/26/17      PT LONG TERM GOAL #6   Title  Increase Berg to >/= 40/56 to reduce fall risk    Status  On-going    Target Date  08/26/17            Plan - 08/09/17 1314    Clinical Impression Statement  Pt continues to make improvements with R step length when walking but is limited by ability to stand on L leg without pain. With every visit pt depends less on the walker and is attempting to walk a few steps to the walker when it is out of reach when being guarded by the therapist, but decreases step length/height when amb without walker. Pt continues to fatigue quickly during PT sessions. Pt continues to demonstrate significant weakness of LEs which impairs her ability to perform ADLs including transferring without using UEs to assist, and walking without significant gait abnormalities and pt will continue to benefit from therapy to address these impairments.    Rehab Potential  Good    PT Treatment/Interventions  Patient/family education;ADLs/Self Care Home Management;Therapeutic exercise;Therapeutic activities;Functional mobility training;Gait training;Balance training;Manual techniques;Passive range of motion;Dry needling;Taping;Cryotherapy;Moist Heat    PT Next Visit Plan  Floor to chair transfer and gait with less restrictive assistive device    Consulted and Agree with Plan of Care  Patient       Patient will benefit from skilled therapeutic intervention in order to improve the following deficits and impairments:  Decreased strength, Decreased mobility, Decreased balance, Decreased activity tolerance, Difficulty walking, Abnormal gait, Decreased endurance, Decreased safety awareness, Decreased knowledge of precautions  Visit Diagnosis: Hemiplegia and hemiparesis following cerebral infarction affecting right dominant side (HCC)  Muscle weakness  (generalized)  Other abnormalities of gait and mobility  Unsteadiness on feet  Difficulty in walking, not elsewhere classified     Problem List Patient Active Problem List   Diagnosis Date Noted  . Labile blood pressure   . Hypoalbuminemia due to protein-calorie malnutrition ([place])   . Acute blood loss anemia   . AKI (acute kidney injury) ([place])   . Peripheral edema   . Stage 3 chronic kidney disease ([place])   . Benign essential HTN   . History of CVA with residual deficit   . Cerebrovascular accident (CVA) due to thrombosis of left middle cerebral artery ([place])   . Right leg weakness 04/19/2017  . Right hemiparesis ([place]) 04/19/2017  . 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 Joint pain 12/09/2015  . ICD (implantable cardioverter-defibrillator) in place 03/26/2015  . Syncope 03/06/2015  . Obesity (BMI 30-39.9) 01/21/2015  . Excessive daytime sleepiness 08/04/2014  . OSA (obstructive sleep apnea) 08/04/2014  . Essential hypertension 06/13/2014  . Chronic systolic CHF (congestive heart failure) ([place]) 05/20/2014  . Congestive heart disease ([name])   . Chronic combined systolic and diastolic CHF (congestive heart failure) ([place]) 05/03/2014  . CKD (chronic kidney disease), stage III ([place]) 05/03/2014  . Hx of stroke without residual deficits 05/03/2014    Shirline Frees, SPT 08/09/2017, 7:19 PM  [facility] [address]  [place], [place], [address] Phone: [phone]   Fax:  [phone]  Name: Aury Scollard MRN: [id number] Date of Birth: 02-25-1940

## 2017-08-10 ENCOUNTER — Other Ambulatory Visit (HOSPITAL_COMMUNITY): Payer: Self-pay

## 2017-08-10 DIAGNOSIS — I5022 Chronic systolic (congestive) heart failure: Secondary | ICD-10-CM | POA: Diagnosis not present

## 2017-08-10 DIAGNOSIS — G4733 Obstructive sleep apnea (adult) (pediatric): Secondary | ICD-10-CM | POA: Diagnosis not present

## 2017-08-10 DIAGNOSIS — G8191 Hemiplegia, unspecified affecting right dominant side: Secondary | ICD-10-CM | POA: Diagnosis not present

## 2017-08-10 NOTE — Progress Notes (Signed)
Paramedicine Encounter    Patient ID: Tara Dunn, female    DOB: 12-Dec-1940, 77 y.o.   MRN: [id number]    Patient Care Team: Fanny Bien, MD as PCP - General (Family Medicine)  Patient Active Problem List   Diagnosis Date Noted  . Labile blood pressure   . Hypoalbuminemia due to protein-calorie malnutrition ([place])   . Acute blood loss anemia   . AKI (acute kidney injury) ([place])   . Peripheral edema   . Stage 3 chronic kidney disease ([place])   . Benign essential HTN   . History of CVA with residual deficit   . Cerebrovascular accident (CVA) due to thrombosis of left middle cerebral artery ([place])   . Right leg weakness 04/19/2017  . Right hemiparesis ([place]) 04/19/2017  . 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 Joint pain 12/09/2015  . ICD (implantable cardioverter-defibrillator) in place 03/26/2015  . Syncope 03/06/2015  . Obesity (BMI 30-39.9) 01/21/2015  . Excessive daytime sleepiness 08/04/2014  . OSA (obstructive sleep apnea) 08/04/2014  . Essential hypertension 06/13/2014  . Chronic systolic CHF (congestive heart failure) ([place]) 05/20/2014  . Congestive heart disease ([place])   . Chronic combined systolic and diastolic CHF (congestive heart failure) ([place]) 05/03/2014  . CKD (chronic kidney disease), stage III ([place]) 05/03/2014  . Hx of stroke without residual deficits 05/03/2014    Current Outpatient Medications:  .  allopurinol (ZYLOPRIM) 100 MG tablet, Take 2 tablets (200 mg total) by mouth daily., Disp: 30 tablet, Rfl: 0 .  atorvastatin (LIPITOR) 80 MG tablet, Take 1 tablet (80 mg total) by mouth daily at 6 PM., Disp: 30 tablet, Rfl: 3 .  carvedilol (COREG) 25 MG tablet, Take 1 tablet (25 mg total) by mouth 2 (two) times daily., Disp: 60 tablet, Rfl: 0 .  carvedilol (COREG) 25 MG tablet, TAKE 1 TABLET(25 MG) BY MOUTH TWICE DAILY, Disp: 180 tablet, Rfl: 0 .  clopidogrel (PLAVIX) 75 MG tablet, Take 1 tablet (75 mg total) by mouth daily., Disp: 30 tablet, Rfl: 0 .   furosemide (LASIX) 20 MG tablet, Take 1 tablet (20 mg total) by mouth daily. Take EXTRA 20 mg tablet once daily AS NEEDED for weight gain 3 lbs in 24 hrs or 5 lbs in 1 week., Disp: 180 tablet, Rfl: 3 .  isosorbide-hydrALAZINE (BIDIL) 20-37.5 MG tablet, Take 2 tablets by mouth 2 (two) times daily., Disp: , Rfl:  .  omeprazole (PRILOSEC) 40 MG capsule, Take 1 capsule (40 mg total) by mouth daily., Disp: 30 capsule, Rfl: 0 .  atorvastatin (LIPITOR) 40 MG tablet, TAKE 1 TABLET(40 MG) BY MOUTH DAILY, Disp: 90 tablet, Rfl: 2 .  senna-docusate (SENOKOT-S) 8.6-50 MG tablet, Take 1 tablet by mouth at bedtime as needed for mild constipation. (Patient not taking: Reported on 07/06/2017), Disp: , Rfl:  Allergies  Allergen Reactions  . Shellfish Allergy Hives     Social History   Socioeconomic History  . Marital status: Widowed    Spouse name: Not on file  . Number of children: Not on file  . Years of education: Not on file  . Highest education level: Not on file  Occupational History  . Not on file  Social Needs  . Financial resource strain: Not on file  . Food insecurity:    Worry: Not on file    Inability: Not on file  . Transportation needs:    Medical: Not on file    Non-medical: Not on file  Tobacco Use  . Smoking status: Former  Smoker    Packs/day: 0.50    Years: 40.00    Pack years: 20.00    Types: Cigarettes  . Smokeless tobacco: Never Used  . Tobacco comment: Quit in 2009.  Substance and Sexual Activity  . Alcohol use: No    Alcohol/week: 0.0 oz    Comment: "drank occasionally when I was young; last drink was in the late 1990s"  . Drug use: No  . Sexual activity: Never  Lifestyle  . Physical activity:    Days per week: Not on file    Minutes per session: Not on file  . Stress: Not on file  Relationships  . Social connections:    Talks on phone: Not on file    Gets together: Not on file    Attends religious service: Not on file    Active member of club or organization:  Not on file    Attends meetings of clubs or organizations: Not on file    Relationship status: Not on file  . Intimate partner violence:    Fear of current or ex partner: Not on file    Emotionally abused: Not on file    Physically abused: Not on file    Forced sexual activity: Not on file  Other Topics Concern  . Not on file  Social History Narrative   Lives alone in an independent living facility.  Education: 10th grade.     Retired from [employer] work.  Moved here from [place] in 2015.    Physical Exam      Future Appointments  Date Time Provider [place]  08/12/2017 10:15 AM Percival Spanish, PT [facility]  08/16/2017 10:15 AM Bess Harvest, PTA [facility]  08/19/2017 10:15 AM Percival Spanish, PT [facility]  08/23/2017 10:15 AM Bess Harvest, PTA [facility]  08/26/2017 10:15 AM Percival Spanish, PT [facility]  09/09/2017 10:00 AM [facility] PA/NP [facility] None  10/24/2017  2:45 PM Venancio Poisson, NP [facility] None    ATF [name] x4 sitting in her bed room, with no complaints.  Pt has been sitting around the house for the most part for a few weeks.  Pt has taken all of her medications without missing any.  Pt denies sob, chest pain and dizziness. Pt's appetite contiunes to go up and down.  rx bottles verified and pill box refilled.   BP 132/82 (BP Location: Right Arm, Patient Position: Sitting, Cuff Size: Large)   Pulse 86   Resp 16   Wt 217 lb 12.8 oz (98.8 kg)   SpO2 98%   BMI 38.58 kg/m   Weight yesterday-didn't weigh Last visit weight-220    Deundre Thong, EMT Paramedic 08/10/2017    ACTION: Home visit completed

## 2017-08-12 ENCOUNTER — Ambulatory Visit: Payer: PPO | Admitting: Physical Therapy

## 2017-08-12 ENCOUNTER — Encounter: Payer: Self-pay | Admitting: Physical Therapy

## 2017-08-12 VITALS — BP 155/88

## 2017-08-12 DIAGNOSIS — Z6837 Body mass index (BMI) 37.0-37.9, adult: Secondary | ICD-10-CM | POA: Diagnosis not present

## 2017-08-12 DIAGNOSIS — I69351 Hemiplegia and hemiparesis following cerebral infarction affecting right dominant side: Secondary | ICD-10-CM | POA: Diagnosis not present

## 2017-08-12 DIAGNOSIS — R2681 Unsteadiness on feet: Secondary | ICD-10-CM

## 2017-08-12 DIAGNOSIS — R0602 Shortness of breath: Secondary | ICD-10-CM | POA: Diagnosis not present

## 2017-08-12 DIAGNOSIS — R262 Difficulty in walking, not elsewhere classified: Secondary | ICD-10-CM

## 2017-08-12 DIAGNOSIS — R2689 Other abnormalities of gait and mobility: Secondary | ICD-10-CM

## 2017-08-12 DIAGNOSIS — I509 Heart failure, unspecified: Secondary | ICD-10-CM | POA: Diagnosis not present

## 2017-08-12 DIAGNOSIS — M6281 Muscle weakness (generalized): Secondary | ICD-10-CM

## 2017-08-12 NOTE — Therapy (Signed)
[facility] [address]  [place], [place], [address] Phone: [phone]   Fax:  [phone]  Physical Therapy Treatment  Patient Details  Name: Tara Dunn MRN: [id number] Date of Birth: 10-15-1940 Referring Provider: Venancio Poisson, NP   Encounter Date: 08/12/2017  PT End of Session - 08/12/17 1110    Visit Number  9    Number of Visits  16    Date for PT Re-Evaluation  08/26/17    Authorization Type  [employer]    PT Start Time  1015    PT Stop Time  1101    PT Time Calculation (min)  46 min    Activity Tolerance  Patient tolerated treatment well;Patient limited by fatigue    Behavior During Therapy  [facility] for tasks assessed/performed;Flat affect       Past Medical History:  Diagnosis Date  . 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 Lovena Le  . Aortic dilatation ([place])    a. 04/2014 CTA chest w/ incidental finding of distal thoracic Ao enlargement of 3.39 mm - f/u needed 04/2015.  . Arthritis    "all over my body"  . Cancer of left breast ([place]) 2009   s/p L mastectomy and chemo.  . Cardiomyopathy ([place])    a. 04/2014 Echo: EF 25-30%, mod conc LVH, possibl antsept HK, Gr1 DD, mod-sev dil LA.  . CHF (congestive heart failure) ([place])   . CKD (chronic kidney disease), stage III ([place])   . Depression   . GERD (gastroesophageal reflux disease)   . Gout   . Heart murmur   . History of stomach ulcers   . Hypertension    a. Dx @ age 23;  b. 04/2014 admission for HTN emergency.  . Obesity (BMI 30-39.9) 01/21/2015  . OSA on CPAP   . Stroke [facility])    a. 3 strokes - last in 1998; "memory problems since"  (03/26/2015)    Past Surgical History:  Procedure Laterality Date  . ABDOMINAL HYSTERECTOMY    . BREAST BIOPSY Left 2008  . CATARACT EXTRACTION, BILATERAL Bilateral   . DILATION AND CURETTAGE OF UTERUS    . EP IMPLANTABLE DEVICE N/A 03/26/2015   MDT single  chamber ICD, Dr. Lovena Le  . LEFT HEART CATHETERIZATION WITH CORONARY ANGIOGRAM N/A 05/07/2014   Procedure: LEFT HEART CATHETERIZATION WITH CORONARY ANGIOGRAM;  Surgeon: Belva Crome, MD;  Location: [facility] C[facility] LAB;  Service: Cardiovascular;  Laterality: N/A;  . MASTECTOMY Left 2009    Vitals:   08/12/17 1038 08/12/17 1048  BP: (!) 155/88   SpO2: (!) 88% 95%    Subjective Assessment - 08/12/17 1019    Subjective  Pt reports that she is doing well today. She reports no pain. States she would eventually like to walk with no AD.     Pertinent History  CVA - Left PLICA infarct 08/07/4763; remote h/o prior CVAs (most recent in 1998); h/o B knee pain managed with injections    How long can you stand comfortably?  5 min    How long can you walk comfortably?  5-10 minutes    Patient Stated Goals  "walk better"    Currently in Pain?  No/denies    Pain Score  0-No pain         [facility] PT Assessment - 08/12/17 0001      Ambulation/Gait   Ambulation/Gait  Yes    Ambulation/Gait Assistance  6: Modified independent (Device/Increase  time)    Ambulation Distance (Feet)  80 Feet    Assistive device  Rolling walker    Gait Pattern  Right foot flat;Left foot flat;Lateral hip instability;Trunk flexed Bilateral increased stance time; decreased cadence    Ambulation Surface  Indoor;Level    Gait velocity  1.07 ft/sec    Gait Comments  Pt demonstrates improved step length bilaterally and better foot clearance                   [facility] Adult PT Treatment/Exercise - 08/12/17 0001      Knee/Hip Exercises: Standing   Hip Flexion  Both;10 reps    Hip Flexion Limitations  In RW; about 1/2 ROM    Hip Extension  Both;2 sets;10 reps;Knee bent    Extension Limitations  Yellow TB around ankles      Knee/Hip Exercises: Seated   Marching  10 reps;Both;Strengthening    Marching Limitations  Yellow TB wrapped around ankles    Sit to Sand  2 sets;5 reps;with UE support 1 set B UE support to 2nd set 1 UE  support             PT Education - 08/12/17 1110    Education provided  Yes    Education Details  Pt instructed to monitor SOB over the next few days and schedule visit with PCP if problem continues to worsen    Person(s) Educated  Patient    Methods  Explanation    Comprehension  Verbalized understanding       PT Short Term Goals - 07/15/17 1036      PT SHORT TERM GOAL #1   Title  Independent with initial HEP    Status  Achieved      PT SHORT TERM GOAL #2   Title  Increase gait speed to >/= 1.4 ft /sec to increase safety with limited community ambulation    Status  On-going        PT Long Term Goals - 08/09/17 1323      PT LONG TERM GOAL #1   Title  Independent with ongoing/advanced HEP     Status  On-going    Target Date  08/26/17      PT LONG TERM GOAL #2   Title  Increase B LE strength to >/= 4/5 to 4/5 for increased ease of mobility    Status  On-going    Target Date  08/26/17      PT LONG TERM GOAL #3   Title  Pt will complete sit to/from stand transfers w/o UE assist demonstrating good control    Status  On-going    Target Date  08/26/17      PT LONG TERM GOAL #4   Title  Decrease TUG time to </= 25 sec to reduce risk for falls during transitional movements    Status  On-going    Target Date  08/26/17      PT LONG TERM GOAL #5   Title  Increase gait speed to >/= 1.8 ft/sec with LRAD to reduce risk for recurrent falls    Status  On-going    Target Date  08/26/17      PT LONG TERM GOAL #6   Title  Increase Berg to >/= 40/56 to reduce fall risk    Status  On-going    Target Date  08/26/17            Plan - 08/12/17 1116    Clinical Impression Statement  Pt had significant SOB during session today, especially after warm up on Nustep machine. PT assessed vitals and SpO2 at 88%, but after 1-2 minutes rest was around 97%. Gait speed assessed today and has improved but not significantly, although quality of gait has improved. Significant  cardiovascular involvement is limiting pt increasing gait speed as she feels SOB. Pt demonstrates longer step length bilaterally with better foot clearance.  Today's session focused on strengthening activities but was severely limited by pt fatigue and monitoring of SpO2 level.      Rehab Potential  Good    PT Treatment/Interventions  Patient/family education;ADLs/Self Care Home Management;Therapeutic exercise;Therapeutic activities;Functional mobility training;Gait training;Balance training;Manual techniques;Passive range of motion;Dry needling;Taping;Cryotherapy;Moist Heat    PT Next Visit Plan  Gait with LRAD (cane)    Consulted and Agree with Plan of Care  Patient       Patient will benefit from skilled therapeutic intervention in order to improve the following deficits and impairments:  Decreased strength, Decreased mobility, Decreased balance, Decreased activity tolerance, Difficulty walking, Abnormal gait, Decreased endurance, Decreased safety awareness, Decreased knowledge of precautions  Visit Diagnosis: Hemiplegia and hemiparesis following cerebral infarction affecting right dominant side (HCC)  Muscle weakness (generalized)  Other abnormalities of gait and mobility  Unsteadiness on feet  Difficulty in walking, not elsewhere classified     Problem List Patient Active Problem List   Diagnosis Date Noted  . Labile blood pressure   . Hypoalbuminemia due to protein-calorie malnutrition ([place])   . Acute blood loss anemia   . AKI (acute kidney injury) ([place])   . Peripheral edema   . Stage 3 chronic kidney disease ([place])   . Benign essential HTN   . History of CVA with residual deficit   . 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 Right leg weakness 04/19/2017  . Right hemiparesis ([place]) 04/19/2017  . 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 Joint pain 12/09/2015  . ICD (implantable cardioverter-defibrillator) in place  03/26/2015  . Syncope 03/06/2015  . Obesity (BMI 30-39.9) 01/21/2015  . Excessive daytime sleepiness 08/04/2014  . OSA (obstructive sleep apnea) 08/04/2014  . Essential hypertension 06/13/2014  . Chronic systolic CHF (congestive heart failure) ([place]) 05/20/2014  . Congestive heart disease ([place])   . Chronic combined systolic and diastolic CHF (congestive heart failure) ([place]) 05/03/2014  . CKD (chronic kidney disease), stage III ([place]) 05/03/2014  . Hx of stroke without residual deficits 05/03/2014    Shirline Frees, SPT 08/12/2017, 11:47 AM  [facility] [address]  [place], [place], [address] Phone: [phone]   Fax:  [phone]  Name: Verdean Murin MRN: [id number] Date of Birth: 1940/06/15

## 2017-08-15 DIAGNOSIS — I509 Heart failure, unspecified: Secondary | ICD-10-CM | POA: Diagnosis not present

## 2017-08-15 DIAGNOSIS — Z6837 Body mass index (BMI) 37.0-37.9, adult: Secondary | ICD-10-CM | POA: Diagnosis not present

## 2017-08-15 DIAGNOSIS — I639 Cerebral infarction, unspecified: Secondary | ICD-10-CM | POA: Diagnosis not present

## 2017-08-15 DIAGNOSIS — N181 Chronic kidney disease, stage 1: Secondary | ICD-10-CM | POA: Diagnosis not present

## 2017-08-15 DIAGNOSIS — I10 Essential (primary) hypertension: Secondary | ICD-10-CM | POA: Diagnosis not present

## 2017-08-16 ENCOUNTER — Ambulatory Visit: Payer: PPO

## 2017-08-16 VITALS — BP 122/80 | HR 78

## 2017-08-16 DIAGNOSIS — R2681 Unsteadiness on feet: Secondary | ICD-10-CM

## 2017-08-16 DIAGNOSIS — M6281 Muscle weakness (generalized): Secondary | ICD-10-CM

## 2017-08-16 DIAGNOSIS — R262 Difficulty in walking, not elsewhere classified: Secondary | ICD-10-CM

## 2017-08-16 DIAGNOSIS — I69351 Hemiplegia and hemiparesis following cerebral infarction affecting right dominant side: Secondary | ICD-10-CM

## 2017-08-16 DIAGNOSIS — R2689 Other abnormalities of gait and mobility: Secondary | ICD-10-CM

## 2017-08-16 NOTE — Therapy (Signed)
[facility] [address]  [place], [place], [address] Phone: [phone]   Fax:  [phone]  Physical Therapy Treatment  Patient Details  Name: Kaja Jackowski MRN: [id number] Date of Birth: December 06, 1940 Referring Provider: Venancio Poisson, NP  Progress Note  Reporting Period 06/30/17 to 08/16/17  See note below for Objective Data and Assessment of Progress/Goals.    Encounter Date: 08/16/2017  PT End of Session - 08/16/17 1016    Visit Number  10    Number of Visits  16    Date for PT Re-Evaluation  08/26/17    Authorization Type  [employer]    PT Start Time  1016    PT Stop Time  1104    PT Time Calculation (min)  48 min    Activity Tolerance  Patient tolerated treatment well;Patient limited by fatigue    Behavior During Therapy  [facility] for tasks assessed/performed;Flat affect       Past Medical History:  Diagnosis Date  . 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 Lovena Le  . Aortic dilatation ([place])    a. 04/2014 CTA chest w/ incidental finding of distal thoracic Ao enlargement of 3.39 mm - f/u needed 04/2015.  . Arthritis    "all over my body"  . Cancer of left breast ([place]) 2009   s/p L mastectomy and chemo.  . Cardiomyopathy ([place])    a. 04/2014 Echo: EF 25-30%, mod conc LVH, possibl antsept HK, Gr1 DD, mod-sev dil LA.  . CHF (congestive heart failure) ([place])   . CKD (chronic kidney disease), stage III ([place])   . Depression   . GERD (gastroesophageal reflux disease)   . Gout   . Heart murmur   . History of stomach ulcers   . Hypertension    a. Dx @ age 77;  b. 04/2014 admission for HTN emergency.  . Obesity (BMI 30-39.9) 01/21/2015  . OSA on CPAP   . Stroke [facility])    a. 3 strokes - last in 1998; "memory problems since"  (03/26/2015)    Past Surgical History:  Procedure Laterality Date  . ABDOMINAL HYSTERECTOMY    . BREAST BIOPSY Left 2008  . CATARACT  EXTRACTION, BILATERAL Bilateral   . DILATION AND CURETTAGE OF UTERUS    . EP IMPLANTABLE DEVICE N/A 03/26/2015   MDT single chamber ICD, Dr. Lovena Le  . LEFT HEART CATHETERIZATION WITH CORONARY ANGIOGRAM N/A 05/07/2014   Procedure: LEFT HEART CATHETERIZATION WITH CORONARY ANGIOGRAM;  Surgeon: Belva Crome, MD;  Location: [facility] CATH LAB;  Service: Cardiovascular;  Laterality: N/A;  . MASTECTOMY Left 2009    Vitals:   08/16/17 1019  BP: 122/80  Pulse: 78  SpO2: 96%    Subjective Assessment - 08/16/17 1102    Subjective  Notes my doctor gave me water pills on Friday and also saw him yesterday.        Pertinent History  CVA - Left PLICA infarct 06/16/3265; remote h/o prior CVAs (most recent in 1998); h/o B knee pain managed with injections    How long can you sit comfortably?  20 min     How long can you stand comfortably?  10 min     How long can you walk comfortably?  10 min     Patient Stated Goals  "walk better"    Currently in Pain?  No/denies    Pain Score  0-No pain    Multiple Pain Sites  No         [facility] PT Assessment - 08/16/17 1043      Strength   Right/Left Hip  Right;Left    Right Hip Flexion  2+/5    Right Hip Extension  3-/5    Right Hip ABduction  3+/5    Right Hip ADduction  3+/5    Left Hip Flexion  3/5    Left Hip Extension  4-/5    Left Hip ABduction  3+/5    Left Hip ADduction  4-/5    Right/Left Knee  Right;Left    Right Knee Flexion  3+/5    Right Knee Extension  4-/5    Left Knee Flexion  4-/5    Left Knee Extension  4/5    Right/Left Ankle  Right;Left    Right Ankle Dorsiflexion  4-/5    Left Ankle Dorsiflexion  4/5      Berg Balance Test   Sit to Stand  Able to stand using hands after several tries    Standing Unsupported  Able to stand 2 minutes with supervision    Sitting with Back Unsupported but Feet Supported on Floor or Stool  Able to sit safely and securely 2 minutes    Stand to Sit  Controls descent by using hands    Transfers  Able to  transfer with verbal cueing and /or supervision    Standing Unsupported with Eyes Closed  Able to stand 10 seconds with supervision    Standing Ubsupported with Feet Together  Able to place feet together independently and stand for 1 minute with supervision    From Standing, Reach Forward with Outstretched Arm  Can reach forward >5 cm safely (2")    From Standing Position, Pick up Object from Floor  Able to pick up shoe, needs supervision    From Standing Position, Turn to Look Behind Over each Shoulder  Turn sideways only but maintains balance    Turn 360 Degrees  Needs close supervision or verbal cueing    Standing Unsupported, Alternately Place Feet on Step/Stool  Needs assistance to keep from falling or unable to try    Standing Unsupported, One Foot in Front  Able to take small step independently and hold 30 seconds    Standing on One Leg  Unable to try or needs assist to prevent fall    Total Score  30                   [facility] Adult PT Treatment/Exercise - 08/16/17 1043      Ambulation/Gait   Ambulation/Gait  Yes    Ambulation/Gait Assistance  6: Modified independent (Device/Increase time)    Ambulation Distance (Feet)  90 Feet    Assistive device  Rolling walker    Gait Pattern  Right foot flat;Left foot flat;Lateral hip instability;Trunk flexed    Ambulation Surface  Level;Indoor    Gait velocity  1.21 ft/sec 27.43 sec and 26.63 sec     Gait Comments  Pt. demonstrating improved B LE clearance since starting therapy and improved gait speed demonstrating decreased fall risk; gait training today focusing on cueing to improve RW spacing and upright posture       Knee/Hip Exercises: Seated   Sit to Sand  2 sets;5 reps;with UE support from mat table focusing on proper forward wt. shift                PT Short Term Goals - 08/16/17 1222  PT SHORT TERM GOAL #1   Title  Independent with initial HEP    Status  Achieved      PT SHORT TERM GOAL #2   Title   Increase gait speed to >/= 1.4 ft /sec to increase safety with limited community ambulation    Status  On-going 1.68f/sec with RW        PT Long Term Goals - 08/16/17 1034      PT LONG TERM GOAL #1   Title  Independent with ongoing/advanced HEP     Status  Partially Met met for current HEP       PT LONG TERM GOAL #2   Title  Increase B LE strength to >/= 4/5 to 4/5 for increased ease of mobility    Status  Partially Met met for L hip extension and L DF strength 4/5      PT LONG TERM GOAL #3   Title  Pt will complete sit to/from stand transfers w/o UE assist demonstrating good control    Status  On-going Needs B UE for safe sit<>stand       PT LONG TERM GOAL #4   Title  Decrease TUG time to </= 25 sec to reduce risk for falls during transitional movements    Status  On-going 45.19 sec with RW      PT LONG TERM GOAL #5   Title  Increase gait speed to >/= 1.8 ft/sec with LRAD to reduce risk for recurrent falls    Status  On-going 1.21 ft/ sec with RW      PT LONG TERM GOAL #6   Title  Increase Berg to >/= 40/56 to reduce fall risk    Status  On-going 30/56            Plan - 08/16/17 1104    Clinical Impression Statement  MTakeramaking progress with therapy.  Pt. able to demo improvement in BERG Balance testing today from 24/56 > 30/56 demonstrating reduction in fall risk.  Able to demo improvement in gait speed from 1.012fsec > 1.2194fec today however only limited improvement in TUG time.   LE strength demonstrating improvement today with MMT partially meeting goal.  Pt. noting her primary goal for remainder of POC is, "To be able to learn how to walk".  Pt. noting she feels she has improved with her walking tolerance and is now ambulating with RW at home from garage to end of driveway 2x/day.  Still feels most limited with tolerance for walking and with strength with sit<>stand transfers.  Discussion with pt. regarding HEP revealed pt. only with partial adherence and pt.  strongly encouraged to perform HEP daily for full benefit from therapy.  Pt. current medical complications do contribute to slower than normal progress with therapy with gait and standing activities primarily limited by pt. complaint of shortness of breath and LE fatigue.  Vitals within therapeutic levels today however pt. blood oxygen saturation with tendency to drop quickly with activities requiring sitting rest breaks to recover.  MarBobieking progress toward goals and will continue to benefit from further skilled therapy to improve LE strength, balance, and safety with walking and functional tasks.      PT Treatment/Interventions  Patient/family education;ADLs/Self Care Home Management;Therapeutic exercise;Therapeutic activities;Functional mobility training;Gait training;Balance training;Manual techniques;Passive range of motion;Dry needling;Taping;Cryotherapy;Moist Heat    Consulted and Agree with Plan of Care  Patient       Patient will benefit from skilled therapeutic intervention in order to improve the  following deficits and impairments:  Decreased strength, Decreased mobility, Decreased balance, Decreased activity tolerance, Difficulty walking, Abnormal gait, Decreased endurance, Decreased safety awareness, Decreased knowledge of precautions  Visit Diagnosis: Hemiplegia and hemiparesis following cerebral infarction affecting right dominant side (HCC)  Muscle weakness (generalized)  Other abnormalities of gait and mobility  Unsteadiness on feet  Difficulty in walking, not elsewhere classified     Problem List Patient Active Problem List   Diagnosis Date Noted  . Labile blood pressure   . Hypoalbuminemia due to protein-calorie malnutrition ([place])   . Acute blood loss anemia   . AKI (acute kidney injury) ([place])   . Peripheral edema   . Stage 3 chronic kidney disease ([place])   . Benign essential HTN   . History of CVA with residual deficit   . Cerebrovascular accident (CVA) due  to thrombosis of left middle cerebral artery ([place])   . Right leg weakness 04/19/2017  . Right hemiparesis ([place]) 04/19/2017  . 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 Joint pain 12/09/2015  . ICD (implantable cardioverter-defibrillator) in place 03/26/2015  . Syncope 03/06/2015  . Obesity (BMI 30-39.9) 01/21/2015  . Excessive daytime sleepiness 08/04/2014  . OSA (obstructive sleep apnea) 08/04/2014  . Essential hypertension 06/13/2014  . Chronic systolic CHF (congestive heart failure) ([place]) 05/20/2014  . Congestive heart disease ([place])   . Chronic combined systolic and diastolic CHF (congestive heart failure) ([place]) 05/03/2014  . CKD (chronic kidney disease), stage III ([place]) 05/03/2014  . Hx of stroke without residual deficits 05/03/2014    Bess Harvest, PTA 08/16/17 3:17 PM   [facility] [address]  [place], [place], [address] Phone: [phone]   Fax:  [phone]  Name: Chynah Orihuela MRN: [id number] Date of Birth: April 23, 1940   Roshan demonstrating consistent progress with gait speed and improving gait pattern, esp with improved consistency of foot clearance. Balance improving as evidenced by 6 point improvement in [place]. Goals currently partially met, with recent progress more limited by fatigue & SOB, however pt reporting she was recently started on Lasix to address SOB. Pt continues to have good potential to further benefit from skilled PT for continued strengthening and balance training to promote improved gait stability and decreased need for AD with gait.  Percival Spanish, PT, MPT 08/16/17, 3:17 PM  [facility] [address]  [place], [place], [address] Phone: [phone]   Fax:  [phone]

## 2017-08-18 ENCOUNTER — Ambulatory Visit: Payer: PPO | Admitting: Physical Therapy

## 2017-08-18 DIAGNOSIS — M6281 Muscle weakness (generalized): Secondary | ICD-10-CM

## 2017-08-18 DIAGNOSIS — I69351 Hemiplegia and hemiparesis following cerebral infarction affecting right dominant side: Secondary | ICD-10-CM

## 2017-08-18 DIAGNOSIS — R262 Difficulty in walking, not elsewhere classified: Secondary | ICD-10-CM

## 2017-08-18 DIAGNOSIS — R2689 Other abnormalities of gait and mobility: Secondary | ICD-10-CM

## 2017-08-18 DIAGNOSIS — R2681 Unsteadiness on feet: Secondary | ICD-10-CM

## 2017-08-18 NOTE — Therapy (Signed)
[facility] [address]  [place], [place], [address] Phone: [phone]   Fax:  [phone]  Physical Therapy Treatment  Patient Details  Name: Tara Dunn MRN: [id number] Date of Birth: Nov 22, 1940 Referring Provider: Venancio Poisson, NP   Encounter Date: 08/18/2017  PT End of Session - 08/18/17 1323    Visit Number  11    Number of Visits  16    Date for PT Re-Evaluation  08/26/17    Authorization Type  [employer]    PT Start Time  1062    PT Stop Time  1401    PT Time Calculation (min)  44 min    Activity Tolerance  Patient tolerated treatment well;Patient limited by fatigue    Behavior During Therapy  [facility] for tasks assessed/performed       Past Medical History:  Diagnosis Date  . 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 Lovena Le  . Aortic dilatation ([place])    a. 04/2014 CTA chest w/ incidental finding of distal thoracic Ao enlargement of 3.39 mm - f/u needed 04/2015.  . Arthritis    "all over my body"  . Cancer of left breast ([place]) 2009   s/p L mastectomy and chemo.  . Cardiomyopathy ([place])    a. 04/2014 Echo: EF 25-30%, mod conc LVH, possibl antsept HK, Gr1 DD, mod-sev dil LA.  . CHF (congestive heart failure) ([place])   . CKD (chronic kidney disease), stage III ([place])   . Depression   . GERD (gastroesophageal reflux disease)   . Gout   . Heart murmur   . History of stomach ulcers   . Hypertension    a. Dx @ age 75;  b. 04/2014 admission for HTN emergency.  . Obesity (BMI 30-39.9) 01/21/2015  . OSA on CPAP   . Stroke [facility])    a. 3 strokes - last in 1998; "memory problems since"  (03/26/2015)    Past Surgical History:  Procedure Laterality Date  . ABDOMINAL HYSTERECTOMY    . BREAST BIOPSY Left 2008  . CATARACT EXTRACTION, BILATERAL Bilateral   . DILATION AND CURETTAGE OF UTERUS    . EP IMPLANTABLE DEVICE N/A 03/26/2015   MDT single chamber  ICD, Dr. Lovena Le  . LEFT HEART CATHETERIZATION WITH CORONARY ANGIOGRAM N/A 05/07/2014   Procedure: LEFT HEART CATHETERIZATION WITH CORONARY ANGIOGRAM;  Surgeon: Belva Crome, MD;  Location: [facility] CATH LAB;  Service: Cardiovascular;  Laterality: N/A;  . MASTECTOMY Left 2009    There were no vitals filed for this visit.  Subjective Assessment - 08/18/17 1325    Subjective  Pt states that she is okay today. No concerns that she would like to discuss with PT.     Pertinent History  CVA - Left PLICA infarct 6/94/8546; remote h/o prior CVAs (most recent in 1998); h/o B knee pain managed with injections    Currently in Pain?  No/denies                       [facility] Adult PT Treatment/Exercise - 08/18/17 0001      Knee/Hip Exercises: Aerobic   Nustep  L5 x 6 min (B UE/LE)      Knee/Hip Exercises: Standing   Hip Abduction  2 sets;10 reps    Abduction Limitations  1st set: Walking sideways the length of the counter; 2nd set stepping over 4 balance bubbles about 2 ft apart. Pt required VC to  step with adequate spacing for 2nd foot placement and to go over bubbles instead of behind them.     Forward Step Up  10 reps;2 sets;Hand Hold: 1    Forward Step Up Limitations  Toe Tapping onto 4" step with 1 UE assist by PT    Functional Squat  10 reps    Functional Squat Limitations  10 reps focusing on good eccentric control lowering to airex pad on chair with bilateral UE support on counter      Knee/Hip Exercises: Seated   Other Seated Knee/Hip Exercises  Fitter Leg Press; 2 blue bands; 10 reps; R LE only     Hamstring Curl  10 reps;Strengthening;Right    Hamstring Limitations  Red TB    Sit to Sand  --          Balance Exercises - 08/18/17 1344      Balance Exercises: Standing   Stepping Strategy  Anterior;Posterior;UE support;Other reps (comment) 4 bil reps over 1/2 foam roll, 4 reps over body blade        PT Education - 08/18/17 1328    Education provided  Yes    Education  Details  PT provided education about plan of care     Person(s) Educated  Patient    Methods  Explanation    Comprehension  Verbalized understanding       PT Short Term Goals - 08/16/17 1222      PT SHORT TERM GOAL #1   Title  Independent with initial HEP    Status  Achieved      PT SHORT TERM GOAL #2   Title  Increase gait speed to >/= 1.4 ft /sec to increase safety with limited community ambulation    Status  On-going 1.58f/sec with RW        PT Long Term Goals - 08/16/17 1034      PT LONG TERM GOAL #1   Title  Independent with ongoing/advanced HEP     Status  Partially Met met for current HEP       PT LONG TERM GOAL #2   Title  Increase B LE strength to >/= 4/5 to 4/5 for increased ease of mobility    Status  Partially Met met for L hip extension and L DF strength 4/5      PT LONG TERM GOAL #3   Title  Pt will complete sit to/from stand transfers w/o UE assist demonstrating good control    Status  On-going Needs B UE for safe sit<>stand       PT LONG TERM GOAL #4   Title  Decrease TUG time to </= 25 sec to reduce risk for falls during transitional movements    Status  On-going 45.19 sec with RW      PT LONG TERM GOAL #5   Title  Increase gait speed to >/= 1.8 ft/sec with LRAD to reduce risk for recurrent falls    Status  On-going 1.21 ft/ sec with RW      PT LONG TERM GOAL #6   Title  Increase Berg to >/= 40/56 to reduce fall risk    Status  On-going 30/56            Plan - 08/18/17 1439    Clinical Impression Statement  Session today focused mainly on LE strengthening to focus on carryover of strength into gait activities including adequate hip flexion and good eccentric glute control. Pt continues to demonstrate significant gait deviations, especially  when walking with 1 UE assist from therapist instead of walker. Pt will continue to benefit from physical therapy to continue to address her strength deficits and improve her gait abnormalities to progress  toward her functional goals and improve her functional mobility.     Rehab Potential  Good    PT Treatment/Interventions  Patient/family education;ADLs/Self Care Home Management;Therapeutic exercise;Therapeutic activities;Functional mobility training;Gait training;Balance training;Manual techniques;Passive range of motion;Dry needling;Taping;Cryotherapy;Moist Heat    Consulted and Agree with Plan of Care  Patient       Patient will benefit from skilled therapeutic intervention in order to improve the following deficits and impairments:  Decreased strength, Decreased mobility, Decreased balance, Decreased activity tolerance, Difficulty walking, Abnormal gait, Decreased endurance, Decreased safety awareness, Decreased knowledge of precautions  Visit Diagnosis: Hemiplegia and hemiparesis following cerebral infarction affecting right dominant side (HCC)  Muscle weakness (generalized)  Other abnormalities of gait and mobility  Unsteadiness on feet  Difficulty in walking, not elsewhere classified     Problem List Patient Active Problem List   Diagnosis Date Noted  . Labile blood pressure   . Hypoalbuminemia due to protein-calorie malnutrition ([place])   . Acute blood loss anemia   . AKI (acute kidney injury) ([place])   . Peripheral edema   . Stage 3 chronic kidney disease ([place])   . Benign essential HTN   . History of CVA with residual deficit   . Cerebrovascular accident (CVA) due to thrombosis of left middle cerebral artery ([place])   . Right leg weakness 04/19/2017  . Right hemiparesis ([place]) 04/19/2017  . 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 Joint pain 12/09/2015  . ICD (implantable cardioverter-defibrillator) in place 03/26/2015  . Syncope 03/06/2015  . Obesity (BMI 30-39.9) 01/21/2015  . Excessive daytime sleepiness 08/04/2014  . OSA (obstructive sleep apnea) 08/04/2014  . Essential hypertension 06/13/2014  . Chronic systolic CHF (congestive heart failure) ([place]) 05/20/2014   . Congestive heart disease ([place])   . Chronic combined systolic and diastolic CHF (congestive heart failure) ([place]) 05/03/2014  . CKD (chronic kidney disease), stage III ([place]) 05/03/2014  . Hx of stroke without residual deficits 05/03/2014    Shirline Frees 08/18/2017, 7:15 PM  [facility] [address]  [place], [place], [address] Phone: [phone]   Fax:  [phone]  Name: Tara Dunn MRN: [id number] Date of Birth: [birth year]

## 2017-08-19 ENCOUNTER — Ambulatory Visit: Payer: PPO | Admitting: Physical Therapy

## 2017-08-22 DIAGNOSIS — I509 Heart failure, unspecified: Secondary | ICD-10-CM | POA: Diagnosis not present

## 2017-08-22 DIAGNOSIS — I10 Essential (primary) hypertension: Secondary | ICD-10-CM | POA: Diagnosis not present

## 2017-08-23 ENCOUNTER — Ambulatory Visit: Payer: PPO

## 2017-08-23 DIAGNOSIS — R2681 Unsteadiness on feet: Secondary | ICD-10-CM

## 2017-08-23 DIAGNOSIS — I69351 Hemiplegia and hemiparesis following cerebral infarction affecting right dominant side: Secondary | ICD-10-CM

## 2017-08-23 DIAGNOSIS — R262 Difficulty in walking, not elsewhere classified: Secondary | ICD-10-CM

## 2017-08-23 DIAGNOSIS — M6281 Muscle weakness (generalized): Secondary | ICD-10-CM

## 2017-08-23 DIAGNOSIS — R2689 Other abnormalities of gait and mobility: Secondary | ICD-10-CM

## 2017-08-23 NOTE — Therapy (Signed)
[facility] [address]  [place], [place], [address] Phone: [phone]   Fax:  [phone]  Physical Therapy Treatment  Patient Details  Name: Tara Dunn MRN: [id number] Date of Birth: [birth year] Referring Provider: Venancio Poisson, NP   Encounter Date: 08/23/2017  PT End of Session - 08/23/17 1022    Visit Number  12    Number of Visits  16    Date for PT Re-Evaluation  08/26/17    Authorization Type  [employer]    PT Start Time  1013    PT Stop Time  1057    PT Time Calculation (min)  44 min    Equipment Utilized During Treatment  Gait belt    Activity Tolerance  Patient tolerated treatment well;Patient limited by fatigue    Behavior During Therapy  WFL for tasks assessed/performed       Past Medical History:  Diagnosis Date  . 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 Lovena Le  . Aortic dilatation ([place])    a. 04/2014 CTA chest w/ incidental finding of distal thoracic Ao enlargement of 3.39 mm - f/u needed 04/2015.  . Arthritis    "all over my body"  . Cancer of left breast ([place]) 2009   s/p L mastectomy and chemo.  . Cardiomyopathy ([place])    a. 04/2014 Echo: EF 25-30%, mod conc LVH, possibl antsept HK, Gr1 DD, mod-sev dil LA.  . CHF (congestive heart failure) ([place])   . CKD (chronic kidney disease), stage III ([place])   . Depression   . GERD (gastroesophageal reflux disease)   . Gout   . Heart murmur   . History of stomach ulcers   . Hypertension    a. Dx @ age 44;  b. 04/2014 admission for HTN emergency.  . Obesity (BMI 30-39.9) 01/21/2015  . OSA on CPAP   . Stroke [facility])    a. 3 strokes - last in 1998; "memory problems since"  (03/26/2015)    Past Surgical History:  Procedure Laterality Date  . ABDOMINAL HYSTERECTOMY    . BREAST BIOPSY Left 2008  . CATARACT EXTRACTION, BILATERAL Bilateral   . DILATION AND CURETTAGE OF UTERUS    . EP  IMPLANTABLE DEVICE N/A 03/26/2015   MDT single chamber ICD, Dr. Lovena Le  . LEFT HEART CATHETERIZATION WITH CORONARY ANGIOGRAM N/A 05/07/2014   Procedure: LEFT HEART CATHETERIZATION WITH CORONARY ANGIOGRAM;  Surgeon: Belva Crome, MD;  Location: [facility] CATH LAB;  Service: Cardiovascular;  Laterality: N/A;  . MASTECTOMY Left 2009    There were no vitals filed for this visit.  Subjective Assessment - 08/23/17 1020    Subjective  Pt. doing well today.  Noting HEP activities are not becoming easy yet.      Pertinent History  CVA - Left PLICA infarct 0/53/9767; remote h/o prior CVAs (most recent in 1998); h/o B knee pain managed with injections    Patient Stated Goals  "walk better"    Currently in Pain?  No/denies    Pain Score  0-No pain    Multiple Pain Sites  No                       [facility] Adult PT Treatment/Exercise - 08/23/17 1037      Ambulation/Gait   Ambulation/Gait  Yes    Ambulation/Gait Assistance  5: Supervision;4: Min guard    Ambulation Distance (Feet)  90 Feet 2 x  45 ft     Assistive device  Straight cane    Gait Pattern  Right foot flat;Left foot flat;Lateral hip instability;Decreased weight shift to right;Decreased stance time - right;Decreased step length - left;Step-to pattern    Ambulation Surface  Level;Indoor    Gait Comments  Gait training with SPC today requiring CGA/supervision from therapist for safety secondary to pt. lateral hip instability, minor LOB, and poor LE clearance/step length; pt. require mod cueing for proper sequencing with SPC and limited carryover between activities for proper sequencing and incresaed L step length; pt. instructed to continue ambulating at home with RW      Knee/Hip Exercises: Standing   Heel Raises  Both;10 reps;3 seconds;2 sets    Heel Raises Limitations  light 2 HH assist from therapist and RW second set    Hip Abduction  10 reps;1 set;Left;Right;Knee straight    Abduction Limitations  1 HH assist from therapist and  cane     Hip Extension  Right;Left;10 reps;Knee straight    Extension Limitations  1 HH assist from therapist and other UE support on [place]  10 reps;5 seconds    Functional Squat Limitations  in RW with light UE support with chair behind pt.  pt. requiring cueing for upright posture and decrease UE use      Knee/Hip Exercises: Seated   Long Arc Quad  Right;Strengthening;Weights;10 reps    Long Arc Quad Limitations  5" hold no resistance difficulty maintaining TKE    Other Seated Knee/Hip Exercises  Fitter Leg Press; (1 blue bands, 1 blabk band) x 10 reps; R LE only     Hamstring Curl  Right;15 reps;Strengthening    Hamstring Limitations  Red TB             PT Education - 08/23/17 1222    Education provided  Yes    Education Details  HEP update     Person(s) Educated  Patient    Methods  Explanation;Demonstration;Verbal cues;Handout    Comprehension  Verbalized understanding;Returned demonstration;Verbal cues required       PT Short Term Goals - 08/16/17 1222      PT SHORT TERM GOAL #1   Title  Independent with initial HEP    Status  Achieved      PT SHORT TERM GOAL #2   Title  Increase gait speed to >/= 1.4 ft /sec to increase safety with limited community ambulation    Status  On-going 1.46f/sec with RW        PT Long Term Goals - 08/16/17 1034      PT LONG TERM GOAL #1   Title  Independent with ongoing/advanced HEP     Status  Partially Met met for current HEP       PT LONG TERM GOAL #2   Title  Increase B LE strength to >/= 4/5 to 4/5 for increased ease of mobility    Status  Partially Met met for L hip extension and L DF strength 4/5      PT LONG TERM GOAL #3   Title  Pt will complete sit to/from stand transfers w/o UE assist demonstrating good control    Status  On-going Needs B UE for safe sit<>stand       PT LONG TERM GOAL #4   Title  Decrease TUG time to </= 25 sec to reduce risk for falls during transitional movements    Status   On-going 45.19 sec with  RW      PT LONG TERM GOAL #5   Title  Increase gait speed to >/= 1.8 ft/sec with LRAD to reduce risk for recurrent falls    Status  On-going 1.21 ft/ sec with RW      PT LONG TERM GOAL #6   Title  Increase Berg to >/= 40/56 to reduce fall risk    Status  On-going 30/56            Plan - 08/23/17 1214    Clinical Impression Statement  Pt. doing well today.  Reports she feels she has had less shortness of breath since recent medication change.  Did require periodic sitting rest breaks with standing activities and gait training secondary to fatigue in today's session.  Tolerated addition of RW heel raise, RW squat, and increased resistance with seated fitter leg press well today.  HEP updated to include more standing activities in RW.  Gait training focusing on proper step-length and sequencing with SPC today.  Pt. requiring supervision and CGA at times with [facility] gait today for safety secondary to lateral hip instability and poor LE clearance.  Mod/Heavy cueing required for proper sequencing with SPC today and pt. encouraged to continue using RW at home for ambulation.  Will plan to monitor tolerance for updated HEP at upcoming visit.      PT Treatment/Interventions  Patient/family education;ADLs/Self Care Home Management;Therapeutic exercise;Therapeutic activities;Functional mobility training;Gait training;Balance training;Manual techniques;Passive range of motion;Dry needling;Taping;Cryotherapy;Moist Heat    PT Next Visit Plan  Monitor tolerance for updated HEP; Continue gait with LRAD (cane)    Consulted and Agree with Plan of Care  Patient       Patient will benefit from skilled therapeutic intervention in order to improve the following deficits and impairments:  Decreased strength, Decreased mobility, Decreased balance, Decreased activity tolerance, Difficulty walking, Abnormal gait, Decreased endurance, Decreased safety awareness, Decreased knowledge of  precautions  Visit Diagnosis: Hemiplegia and hemiparesis following cerebral infarction affecting right dominant side (HCC)  Muscle weakness (generalized)  Other abnormalities of gait and mobility  Unsteadiness on feet  Difficulty in walking, not elsewhere classified     Problem List Patient Active Problem List   Diagnosis Date Noted  . Labile blood pressure   . Hypoalbuminemia due to protein-calorie malnutrition ([place])   . Acute blood loss anemia   . AKI (acute kidney injury) ([place])   . Peripheral edema   . Stage 3 chronic kidney disease ([place])   . Benign essential HTN   . History of CVA with residual deficit   . Cerebrovascular accident (CVA) due to thrombosis of left middle cerebral artery ([place])   . Right leg weakness 04/19/2017  . Right hemiparesis ([place]) 04/19/2017  . 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 Joint pain 12/09/2015  . ICD (implantable cardioverter-defibrillator) in place 03/26/2015  . Syncope 03/06/2015  . Obesity (BMI 30-39.9) 01/21/2015  . Excessive daytime sleepiness 08/04/2014  . OSA (obstructive sleep apnea) 08/04/2014  . Essential hypertension 06/13/2014  . Chronic systolic CHF (congestive heart failure) ([place]) 05/20/2014  . Congestive heart disease ([place])   . Chronic combined systolic and diastolic CHF (congestive heart failure) ([place]) 05/03/2014  . CKD (chronic kidney disease), stage III ([place]) 05/03/2014  . Hx of stroke without residual deficits 05/03/2014    Bess Harvest, PTA 08/23/17 12:30 PM   [facility] [address]  [place], [place], [address] Phone: [phone]   Fax:  [phone]  Name: Tara Dunn MRN: [id number] Date of Birth: July 19, 1940

## 2017-08-24 ENCOUNTER — Other Ambulatory Visit (HOSPITAL_COMMUNITY): Payer: Self-pay

## 2017-08-24 ENCOUNTER — Telehealth (HOSPITAL_COMMUNITY): Payer: Self-pay | Admitting: *Deleted

## 2017-08-24 MED ORDER — POTASSIUM CHLORIDE 20 MEQ PO PACK: 20.0000 meq | PACK | Freq: Every day | ORAL | 3 refills | Status: DC

## 2017-08-24 MED ORDER — FUROSEMIDE 20 MG PO TABS: 40.0000 mg | ORAL_TABLET | Freq: Two times a day (BID) | ORAL | 3 refills | Status: DC

## 2017-08-24 MED ORDER — POTASSIUM CHLORIDE CRYS ER 20 MEQ PO TBCR: 20.0000 meq | EXTENDED_RELEASE_TABLET | Freq: Every day | ORAL | 3 refills | Status: DC

## 2017-08-24 NOTE — Telephone Encounter (Signed)
Tara Dunn called to report PCP increased Lasix to 40mg  twice daily and added Potassium 34meq daily because her fluid was up. Patient states she feels a lot better. Med list updated.

## 2017-08-24 NOTE — Progress Notes (Signed)
Paramedicine Encounter    Patient ID: Neela Sahm, female    DOB: 1940/09/30, 77 y.o.   MRN: [id number]    Patient Care Team: Fanny Bien, MD as PCP - General (Family Medicine)  Patient Active Problem List   Diagnosis Date Noted  . Labile blood pressure   . Hypoalbuminemia due to protein-calorie malnutrition ([place])   . Acute blood loss anemia   . AKI (acute kidney injury) ([place])   . Peripheral edema   . Stage 3 chronic kidney disease ([place])   . Benign essential HTN   . History of CVA with residual deficit   . Cerebrovascular accident (CVA) due to thrombosis of left middle cerebral artery ([place])   . Right leg weakness 04/19/2017  . Right hemiparesis ([place]) 04/19/2017  . 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 Joint pain 12/09/2015  . ICD (implantable cardioverter-defibrillator) in place 03/26/2015  . Syncope 03/06/2015  . Obesity (BMI 30-39.9) 01/21/2015  . Excessive daytime sleepiness 08/04/2014  . OSA (obstructive sleep apnea) 08/04/2014  . Essential hypertension 06/13/2014  . Chronic systolic CHF (congestive heart failure) ([place]) 05/20/2014  . Congestive heart disease ([place])   . Chronic combined systolic and diastolic CHF (congestive heart failure) ([place]) 05/03/2014  . CKD (chronic kidney disease), stage III ([place]) 05/03/2014  . Hx of stroke without residual deficits 05/03/2014    Current Outpatient Medications:  .  allopurinol (ZYLOPRIM) 100 MG tablet, Take 2 tablets (200 mg total) by mouth daily., Disp: 30 tablet, Rfl: 0 .  atorvastatin (LIPITOR) 80 MG tablet, Take 1 tablet (80 mg total) by mouth daily at 6 PM., Disp: 30 tablet, Rfl: 3 .  carvedilol (COREG) 25 MG tablet, Take 1 tablet (25 mg total) by mouth 2 (two) times daily., Disp: 60 tablet, Rfl: 0 .  carvedilol (COREG) 25 MG tablet, TAKE 1 TABLET(25 MG) BY MOUTH TWICE DAILY, Disp: 180 tablet, Rfl: 0 .  clopidogrel (PLAVIX) 75 MG tablet, Take 1 tablet (75 mg total) by mouth daily., Disp: 30 tablet, Rfl: 0 .   isosorbide-hydrALAZINE (BIDIL) 20-37.5 MG tablet, Take 2 tablets by mouth 2 (two) times daily., Disp: , Rfl:  .  omeprazole (PRILOSEC) 40 MG capsule, Take 1 capsule (40 mg total) by mouth daily., Disp: 30 capsule, Rfl: 0 .  atorvastatin (LIPITOR) 40 MG tablet, TAKE 1 TABLET(40 MG) BY MOUTH DAILY, Disp: 90 tablet, Rfl: 2 .  furosemide (LASIX) 20 MG tablet, Take 2 tablets (40 mg total) by mouth 2 (two) times daily., Disp: 180 tablet, Rfl: 3 .  potassium chloride (KLOR-CON) 20 MEQ packet, Take 20 mEq by mouth daily., Disp: 30 packet, Rfl: 3 .  potassium chloride SA (K-DUR,KLOR-CON) 20 MEQ tablet, Take 1 tablet (20 mEq total) by mouth daily., Disp: 90 tablet, Rfl: 3 .  senna-docusate (SENOKOT-S) 8.6-50 MG tablet, Take 1 tablet by mouth at bedtime as needed for mild constipation. (Patient not taking: Reported on 07/06/2017), Disp: , Rfl:  Allergies  Allergen Reactions  . Shellfish Allergy Hives     Social History   Socioeconomic History  . Marital status: Widowed    Spouse name: Not on file  . Number of children: Not on file  . Years of education: Not on file  . Highest education level: Not on file  Occupational History  . Not on file  Social Needs  . Financial resource strain: Not on file  . Food insecurity:    Worry: Not on file    Inability: Not on file  . Transportation needs:  Medical: Not on file    Non-medical: Not on file  Tobacco Use  . Smoking status: Former Smoker    Packs/day: 0.50    Years: 40.00    Pack years: 20.00    Types: Cigarettes  . Smokeless tobacco: Never Used  . Tobacco comment: Quit in 2009.  Substance and Sexual Activity  . Alcohol use: No    Alcohol/week: 0.0 oz    Comment: "drank occasionally when I was young; last drink was in the late 1990s"  . Drug use: No  . Sexual activity: Never  Lifestyle  . Physical activity:    Days per week: Not on file    Minutes per session: Not on file  . Stress: Not on file  Relationships  . Social connections:     Talks on phone: Not on file    Gets together: Not on file    Attends religious service: Not on file    Active member of club or organization: Not on file    Attends meetings of clubs or organizations: Not on file    Relationship status: Not on file  . Intimate partner violence:    Fear of current or ex partner: Not on file    Emotionally abused: Not on file    Physically abused: Not on file    Forced sexual activity: Not on file  Other Topics Concern  . Not on file  Social History Narrative   Lives alone in an independent living facility.  Education: 10th grade.     Retired from [employer] work.  Moved here from [place] in 2015.    Physical Exam      Future Appointments  Date Time Provider [place]  08/26/2017 10:15 AM Percival Spanish, PT [facility]  09/09/2017 10:00 AM [facility] PA/NP [facility] None  10/24/2017  2:45 PM Venancio Poisson, NP [facility] None     BP 122/72 (BP Location: Right Arm, Patient Position: Sitting, Cuff Size: Large)   Pulse 65   Resp 16   Wt 211 lb 9.6 oz (96 kg)   SpO2 97%   BMI 37.48 kg/m   Weight yesterday-didn't weigh Last visit weight-217  ATF [name] x4 sitting outside in the garage.  Pt stated that she feels a lot better; she saw her pcp (Dr. Ernie Hew) Friday and she increased the lasix and added potassium.  She stated that she had started feeling sob and her pcp stated that she had fluid retention. Pt stated that since the medication change her breathing has gotten a lot better.  Pt denies sob, chest pain and dizziness.   Pt's meds were changed during lasix 40mg  BID and potassium 20 meq powder; changes verified by RN @ Dr. Ernie Hew office. [name] @ heart failure clinic advised that she will make the changes in pts chart.   Medication ordered  Bidil Lasix  Jonee Lamore, EMT Paramedic [phone] 08/24/2017    ACTION: Home visit completed

## 2017-08-26 ENCOUNTER — Ambulatory Visit: Payer: PPO | Admitting: Physical Therapy

## 2017-08-26 DIAGNOSIS — I509 Heart failure, unspecified: Secondary | ICD-10-CM | POA: Diagnosis not present

## 2017-08-26 DIAGNOSIS — N181 Chronic kidney disease, stage 1: Secondary | ICD-10-CM | POA: Diagnosis not present

## 2017-08-26 DIAGNOSIS — I10 Essential (primary) hypertension: Secondary | ICD-10-CM | POA: Diagnosis not present

## 2017-08-26 DIAGNOSIS — Z6836 Body mass index (BMI) 36.0-36.9, adult: Secondary | ICD-10-CM | POA: Diagnosis not present

## 2017-08-26 DIAGNOSIS — E876 Hypokalemia: Secondary | ICD-10-CM | POA: Diagnosis not present

## 2017-08-31 ENCOUNTER — Other Ambulatory Visit (HOSPITAL_COMMUNITY): Payer: Self-pay

## 2017-09-01 NOTE — Progress Notes (Signed)
Paramedicine Encounter    Patient ID: Tara Dunn, female    DOB: 1940-07-21, 77 y.o.   MRN: [id number]    Patient Care Team: Fanny Bien, MD as PCP - General (Family Medicine)  Patient Active Problem List   Diagnosis Date Noted  . Labile blood pressure   . Hypoalbuminemia due to protein-calorie malnutrition ([place])   . Acute blood loss anemia   . AKI (acute kidney injury) ([place])   . Peripheral edema   . Stage 3 chronic kidney disease ([place])   . Benign essential HTN   . History of CVA with residual deficit   . 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 Right leg weakness 04/19/2017  . Right hemiparesis ([place]) 04/19/2017  . 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 Joint pain 12/09/2015  . ICD (implantable cardioverter-defibrillator) in place 03/26/2015  . Syncope 03/06/2015  . Obesity (BMI 30-39.9) 01/21/2015  . Excessive daytime sleepiness 08/04/2014  . OSA (obstructive sleep apnea) 08/04/2014  . Essential hypertension 06/13/2014  . Chronic systolic CHF (congestive heart failure) ([place]) 05/20/2014  . Congestive heart disease ([place])   . Chronic combined systolic and diastolic CHF (congestive heart failure) ([place]) 05/03/2014  . CKD (chronic kidney disease), stage III ([place]) 05/03/2014  . Hx of stroke without residual deficits 05/03/2014    Current Outpatient Medications:  .  allopurinol (ZYLOPRIM) 100 MG tablet, Take 2 tablets (200 mg total) by mouth daily., Disp: 30 tablet, Rfl: 0 .  atorvastatin (LIPITOR) 80 MG tablet, Take 1 tablet (80 mg total) by mouth daily at 6 PM., Disp: 30 tablet, Rfl: 3 .  carvedilol (COREG) 25 MG tablet, Take 1 tablet (25 mg total) by mouth 2 (two) times daily., Disp: 60 tablet, Rfl: 0 .  carvedilol (COREG) 25 MG tablet, TAKE 1 TABLET(25 MG) BY MOUTH TWICE DAILY, Disp: 180 tablet, Rfl: 0 .  clopidogrel (PLAVIX) 75 MG tablet, Take 1 tablet (75 mg total) by mouth daily., Disp: 30 tablet, Rfl: 0 .   furosemide (LASIX) 20 MG tablet, Take 2 tablets (40 mg total) by mouth 2 (two) times daily., Disp: 180 tablet, Rfl: 3 .  isosorbide-hydrALAZINE (BIDIL) 20-37.5 MG tablet, Take 2 tablets by mouth 2 (two) times daily., Disp: , Rfl:  .  omeprazole (PRILOSEC) 40 MG capsule, Take 1 capsule (40 mg total) by mouth daily., Disp: 30 capsule, Rfl: 0 .  potassium chloride SA (K-DUR,KLOR-CON) 20 MEQ tablet, Take 1 tablet (20 mEq total) by mouth daily., Disp: 90 tablet, Rfl: 3 .  atorvastatin (LIPITOR) 40 MG tablet, TAKE 1 TABLET(40 MG) BY MOUTH DAILY, Disp: 90 tablet, Rfl: 2 .  potassium chloride (KLOR-CON) 20 MEQ packet, Take 20 mEq by mouth daily., Disp: 30 packet, Rfl: 3 .  senna-docusate (SENOKOT-S) 8.6-50 MG tablet, Take 1 tablet by mouth at bedtime as needed for mild constipation. (Patient not taking: Reported on 07/06/2017), Disp: , Rfl:  Allergies  Allergen Reactions  . Shellfish Allergy Hives     Social History   Socioeconomic History  . Marital status: Widowed    Spouse name: Not on file  . Number of children: Not on file  . Years of education: Not on file  . Highest education level: Not on file  Occupational History  . Not on file  Social Needs  . Financial resource strain: Not on file  . Food insecurity:    Worry: Not on file    Inability: Not on file  . Transportation needs:  Medical: Not on file    Non-medical: Not on file  Tobacco Use  . Smoking status: Former Smoker    Packs/day: 0.50    Years: 40.00    Pack years: 20.00    Types: Cigarettes  . Smokeless tobacco: Never Used  . Tobacco comment: Quit in 2009.  Substance and Sexual Activity  . Alcohol use: No    Alcohol/week: 0.0 oz    Comment: "drank occasionally when I was young; last drink was in the late 1990s"  . Drug use: No  . Sexual activity: Never  Lifestyle  . Physical activity:    Days per week: Not on file    Minutes per session: Not on file  . Stress: Not on file  Relationships  . Social connections:     Talks on phone: Not on file    Gets together: Not on file    Attends religious service: Not on file    Active member of club or organization: Not on file    Attends meetings of clubs or organizations: Not on file    Relationship status: Not on file  . Intimate partner violence:    Fear of current or ex partner: Not on file    Emotionally abused: Not on file    Physically abused: Not on file    Forced sexual activity: Not on file  Other Topics Concern  . Not on file  Social History Narrative   Lives alone in an independent living facility.  Education: 10th grade.     Retired from [employer] work.  Moved here from [place] in 2015.    Physical Exam      Future Appointments  Date Time Provider [place]  09/02/2017 11:00 AM Percival Spanish, PT [facility]  09/09/2017 10:00 AM [facility] PA/NP [facility] None  10/24/2017  2:45 PM Venancio Poisson, NP [facility] None     BP 136/74 (BP Location: Left Arm, Patient Position: Sitting, Cuff Size: Large)   Pulse 63   Resp 16   SpO2 96%   ATF [name] x4 sitting in the garage listening to music.  Pt's pill box refilled due to vacation for the 8/6.  Pt denies sob, chest pain and dizziness.  Pt has taken all of her meds this week.  rx bottles verified and pill box refilled.   Medication ordered: bidil  Lasix   Tisheena Maguire, EMT Paramedic [phone] 09/01/2017    ACTION: Home visit completed

## 2017-09-02 ENCOUNTER — Encounter: Payer: Self-pay | Admitting: Physical Therapy

## 2017-09-02 ENCOUNTER — Ambulatory Visit: Payer: PPO | Admitting: Physical Therapy

## 2017-09-02 DIAGNOSIS — R2689 Other abnormalities of gait and mobility: Secondary | ICD-10-CM

## 2017-09-02 DIAGNOSIS — R2681 Unsteadiness on feet: Secondary | ICD-10-CM

## 2017-09-02 DIAGNOSIS — I69351 Hemiplegia and hemiparesis following cerebral infarction affecting right dominant side: Secondary | ICD-10-CM

## 2017-09-02 DIAGNOSIS — R262 Difficulty in walking, not elsewhere classified: Secondary | ICD-10-CM

## 2017-09-02 DIAGNOSIS — M6281 Muscle weakness (generalized): Secondary | ICD-10-CM

## 2017-09-02 NOTE — Therapy (Signed)
[facility] [address]  [place], [place], [address] Phone: [phone]   Fax:  [phone]  Physical Therapy Treatment  Patient Details  Name: Tara Dunn MRN: [id number] Date of Birth: 26-Oct-1940 Referring Provider: Venancio Poisson, NP   Encounter Date: 09/02/2017  PT End of Session - 09/02/17 1200    Visit Number  13    Number of Visits  25    Date for PT Re-Evaluation  10/28/17    Authorization Type  [employer]    PT Start Time  1112 Pt arrived late    PT Stop Time  1144    PT Time Calculation (min)  32 min    Equipment Utilized During Treatment  Gait belt    Activity Tolerance  Patient tolerated treatment well;Patient limited by fatigue    Behavior During Therapy  WFL for tasks assessed/performed       Past Medical History:  Diagnosis Date  . 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 Lovena Le  . Aortic dilatation ([place])    a. 04/2014 CTA chest w/ incidental finding of distal thoracic Ao enlargement of 3.39 mm - f/u needed 04/2015.  . Arthritis    "all over my body"  . Cancer of left breast ([place]) 2009   s/p L mastectomy and chemo.  . Cardiomyopathy ([place])    a. 04/2014 Echo: EF 25-30%, mod conc LVH, possibl antsept HK, Gr1 DD, mod-sev dil LA.  . CHF (congestive heart failure) ([place])   . CKD (chronic kidney disease), stage III ([place])   . Depression   . GERD (gastroesophageal reflux disease)   . Gout   . Heart murmur   . History of stomach ulcers   . Hypertension    a. Dx @ age 35;  b. 04/2014 admission for HTN emergency.  . Obesity (BMI 30-39.9) 01/21/2015  . OSA on CPAP   . Stroke [facility])    a. 3 strokes - last in 1998; "memory problems since"  (03/26/2015)    Past Surgical History:  Procedure Laterality Date  . ABDOMINAL HYSTERECTOMY    . BREAST BIOPSY Left 2008  . CATARACT EXTRACTION, BILATERAL Bilateral   . DILATION AND CURETTAGE OF UTERUS     . EP IMPLANTABLE DEVICE N/A 03/26/2015   MDT single chamber ICD, Dr. Lovena Le  . LEFT HEART CATHETERIZATION WITH CORONARY ANGIOGRAM N/A 05/07/2014   Procedure: LEFT HEART CATHETERIZATION WITH CORONARY ANGIOGRAM;  Surgeon: Belva Crome, MD;  Location: [facility] CATH LAB;  Service: Cardiovascular;  Laterality: N/A;  . MASTECTOMY Left 2009    There were no vitals filed for this visit.  Subjective Assessment - 09/02/17 1222    Subjective  Pt reports that she feels alright today and would like to continue with physical therapy to continue to improve her ability to walk without a walker and eventually without any AD.     Pertinent History  CVA - Left PLICA infarct 8/67/5449; remote h/o prior CVAs (most recent in 1998); h/o B knee pain managed with injections    Patient Stated Goals  "walk better"    Currently in Pain?  No/denies         [facility] PT Assessment - 09/02/17 0001      Strength   Right/Left Hip  Right;Left    Right Hip Flexion  3-/5    Right Hip Extension  3-/5    Right Hip ABduction  4-/5    Right Hip ADduction  4-/5    Left Hip Flexion  3+/5    Left Hip Extension  4/5    Left Hip ABduction  4/5    Left Hip ADduction  4/5    Right/Left Knee  Right;Left    Right Knee Flexion  3+/5    Right Knee Extension  4/5    Left Knee Flexion  4/5    Left Knee Extension  4+/5    Right Ankle Dorsiflexion  4/5    Left Ankle Dorsiflexion  4+/5      Ambulation/Gait   Ambulation/Gait  Yes    Ambulation/Gait Assistance  6: Modified independent (Device/Increase time)    Ambulation/Gait Assistance Details  Instructed pt on appropriate patterning with SPC in L UE     Ambulation Distance (Feet)  25 Feet    Assistive device  Straight cane    Gait Pattern  Decreased hip/knee flexion - right;Decreased dorsiflexion - right;Lateral trunk lean to right;Poor foot clearance - left;Poor foot clearance - right    Ambulation Surface  Level;Indoor    Gait velocity  1.23 ft/sec With FWW    Gait Comments  Pt  demonstrates significant improvement in quality of gait with sustained adequate step length with both LEs and decreased dependence on walker when walking into clinic. When amb with cane pt expressed more significant fatigue with shorter distance than when walking with walker.        Timed Up and Go Test   Normal TUG (seconds)  32.16 With FWW                   [facility] Adult PT Treatment/Exercise - 09/02/17 0001      Knee/Hip Exercises: Aerobic   Nustep  L5 x 3 min (B UE/LE) shortened due to pt arriving late               PT Short Term Goals - 09/02/17 1156      PT SHORT TERM GOAL #1   Title  Independent with initial HEP    Status  Achieved      PT SHORT TERM GOAL #2   Title  Increase gait speed to >/= 1.4 ft /sec to increase safety with limited community ambulation    Status  On-going    Target Date  09/30/17        PT Long Term Goals - 09/02/17 1149      PT LONG TERM GOAL #1   Title  Independent with ongoing/advanced HEP     Status  Achieved      PT LONG TERM GOAL #2   Title  Increase B LE strength to >/= 4/5 to 4/5 for increased ease of mobility    Status  On-going    Target Date  10/28/17      PT LONG TERM GOAL #3   Title  Pt will complete sit to/from stand transfers w/o UE assist demonstrating good control    Status  On-going    Target Date  10/28/17      PT LONG TERM GOAL #4   Title  Decrease TUG time to </= 25 sec to reduce risk for falls during transitional movements    Status  On-going    Target Date  10/28/17      PT LONG TERM GOAL #5   Title  Increase gait speed to >/= 1.8 ft/sec with LRAD to reduce risk for recurrent falls    Status  On-going    Target Date  10/28/17  PT LONG TERM GOAL #6   Title  Increase Berg to >/= 37/56 to reduce fall risk    Status  Revised    Target Date  10/28/17            Plan - 09/02/17 1241    Clinical Impression Statement  Tara Dunn has made significant progress thus far throughout her  episode of care, and although she has not met most of her long-term goals she has made progress toward each of them.  She has improved her LE strength, balance ability, and gait speed, and demonstrates improvements in quality of gait both with FWW and SPC. Her progress toward functional goals has taken longer than originally anticipated, however this is not unusual following CVA. Even though she has made progress toward her goals, she continues to demonstrate a gait speed and TUG speed that put her at increased risk for falls. She also has deficits in LE strength which are impacting her speed of gait. She will continue to benefit from physical therapy to improve her LE strength, gait abnormalities, and meet her physical goal of amb with LRAD safely.     PT Frequency  2x / week    PT Duration  8 weeks 2x/week for 4 weeks, then 1x/week for 4 weeks    PT Treatment/Interventions  Patient/family education;ADLs/Self Care Home Management;Therapeutic exercise;Therapeutic activities;Functional mobility training;Gait training;Balance training;Manual techniques;Passive range of motion;Dry needling;Taping;Cryotherapy;Moist Heat    Consulted and Agree with Plan of Care  Patient       Patient will benefit from skilled therapeutic intervention in order to improve the following deficits and impairments:  Decreased strength, Decreased mobility, Decreased balance, Decreased activity tolerance, Difficulty walking, Abnormal gait, Decreased endurance, Decreased safety awareness, Decreased knowledge of precautions  Visit Diagnosis: Hemiplegia and hemiparesis following cerebral infarction affecting right dominant side (HCC)  Muscle weakness (generalized)  Other abnormalities of gait and mobility  Unsteadiness on feet  Difficulty in walking, not elsewhere classified     Problem List Patient Active Problem List   Diagnosis Date Noted  . Labile blood pressure   . Hypoalbuminemia due to protein-calorie malnutrition  ([place])   . Acute blood loss anemia   . AKI (acute kidney injury) ([place])   . Peripheral edema   . Stage 3 chronic kidney disease ([place])   . Benign essential HTN   . History of CVA with residual deficit   . Cerebrovascular accident (CVA) due to thrombosis of left middle cerebral artery ([place])   . Right leg weakness 04/19/2017  . Right hemiparesis ([place]) 04/19/2017  . 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 Joint pain 12/09/2015  . ICD (implantable cardioverter-defibrillator) in place 03/26/2015  . Syncope 03/06/2015  . Obesity (BMI 30-39.9) 01/21/2015  . Excessive daytime sleepiness 08/04/2014  . OSA (obstructive sleep apnea) 08/04/2014  . Essential hypertension 06/13/2014  . Chronic systolic CHF (congestive heart failure) ([place]) 05/20/2014  . Congestive heart disease ([place])   . Chronic combined systolic and diastolic CHF (congestive heart failure) ([place]) 05/03/2014  . CKD (chronic kidney disease), stage III ([place]) 05/03/2014  . Hx of stroke without residual deficits 05/03/2014    Shirline Frees, SPT  09/02/2017, 12:53 PM  [facility] [address]  [place], [place], [address] Phone: [phone]   Fax:  [phone]  Name: Tara Dunn MRN: [id number] Date of Birth: 08-09-1940

## 2017-09-05 ENCOUNTER — Ambulatory Visit: Payer: PPO

## 2017-09-05 DIAGNOSIS — R2689 Other abnormalities of gait and mobility: Secondary | ICD-10-CM

## 2017-09-05 DIAGNOSIS — R262 Difficulty in walking, not elsewhere classified: Secondary | ICD-10-CM

## 2017-09-05 DIAGNOSIS — I69351 Hemiplegia and hemiparesis following cerebral infarction affecting right dominant side: Secondary | ICD-10-CM | POA: Diagnosis not present

## 2017-09-05 DIAGNOSIS — M6281 Muscle weakness (generalized): Secondary | ICD-10-CM

## 2017-09-05 DIAGNOSIS — R2681 Unsteadiness on feet: Secondary | ICD-10-CM

## 2017-09-05 DIAGNOSIS — I63312 Cerebral infarction due to thrombosis of left middle cerebral artery: Secondary | ICD-10-CM

## 2017-09-05 NOTE — Therapy (Signed)
[facility] [address]  [place], [place], [address] Phone: [phone]   Fax:  [phone]  Physical Therapy Treatment  Patient Details  Name: Tara Dunn MRN: [id number] Date of Birth: [birth year] Referring Provider: Venancio Poisson, NP   Encounter Date: 09/05/2017  PT End of Session - 09/05/17 1410    Visit Number  14    Number of Visits  25    Date for PT Re-Evaluation  10/28/17    Authorization Type  [employer]    PT Start Time  5885    PT Stop Time  1445    PT Time Calculation (min)  42 min    Equipment Utilized During Treatment  Gait belt    Activity Tolerance  Patient tolerated treatment well;Patient limited by fatigue    Behavior During Therapy  [facility] for tasks assessed/performed       Past Medical History:  Diagnosis Date  . 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 Lovena Le  . Aortic dilatation ([place])    a. 04/2014 CTA chest w/ incidental finding of distal thoracic Ao enlargement of 3.39 mm - f/u needed 04/2015.  . Arthritis    "all over my body"  . Cancer of left breast ([place]) 2009   s/p L mastectomy and chemo.  . Cardiomyopathy ([place])    a. 04/2014 Echo: EF 25-30%, mod conc LVH, possibl antsept HK, Gr1 DD, mod-sev dil LA.  . CHF (congestive heart failure) ([place])   . CKD (chronic kidney disease), stage III ([place])   . Depression   . GERD (gastroesophageal reflux disease)   . Gout   . Heart murmur   . History of stomach ulcers   . Hypertension    a. Dx @ age 71;  b. 04/2014 admission for HTN emergency.  . Obesity (BMI 30-39.9) 01/21/2015  . OSA on CPAP   . Stroke [facility])    a. 3 strokes - last in 1998; "memory problems since"  (03/26/2015)    Past Surgical History:  Procedure Laterality Date  . ABDOMINAL HYSTERECTOMY    . BREAST BIOPSY Left 2008  . CATARACT EXTRACTION, BILATERAL Bilateral   . DILATION AND CURETTAGE OF UTERUS    . EP  IMPLANTABLE DEVICE N/A 03/26/2015   MDT single chamber ICD, Dr. Lovena Le  . LEFT HEART CATHETERIZATION WITH CORONARY ANGIOGRAM N/A 05/07/2014   Procedure: LEFT HEART CATHETERIZATION WITH CORONARY ANGIOGRAM;  Surgeon: Belva Crome, MD;  Location: [facility] CATH LAB;  Service: Cardiovascular;  Laterality: N/A;  . MASTECTOMY Left 2009    There were no vitals filed for this visit.  Subjective Assessment - 09/05/17 1409    Subjective  Pt. doing well today reporting she is performing recent HEP updated without a problem.      Pertinent History  CVA - Left PLICA infarct 0/27/7412; remote h/o prior CVAs (most recent in 1998); h/o B knee pain managed with injections    Patient Stated Goals  "walk better"    Currently in Pain?  No/denies    Pain Score  0-No pain    Multiple Pain Sites  No                       [facility] Adult PT Treatment/Exercise - 09/05/17 1418      Ambulation/Gait   Ambulation/Gait  Yes    Ambulation/Gait Assistance  5: Supervision    Ambulation/Gait Assistance Details  Cues provided for increased step  length and walker spacing with RW gait training     Assistive device  Rolling walker    Gait Pattern  Decreased hip/knee flexion - right;Decreased dorsiflexion - right;Lateral trunk lean to right;Poor foot clearance - left;Poor foot clearance - right    Ambulation Surface  Level;Indoor      Knee/Hip Exercises: Aerobic   Nustep  L5 x 7 min (B LE)      Knee/Hip Exercises: Standing   Heel Raises  15 reps;3 seconds    Heel Raises Limitations  in RW    Wall Squat  10 reps;3 seconds    Other Standing Knee Exercises  B side stepping with red TB at mat table x 2 laps down/back      Knee/Hip Exercises: Seated   Long Arc Quad  Right;Left;15 reps;Strengthening;Weights    Long Arc Quad Weight  2 lbs.    Long [employer] Limitations  with adduction ball squeeze     Hamstring Curl  Right;Left;10 reps;Strengthening    Hamstring Limitations  green TB    Sit to Sand  10 reps;with UE  support 1 UE pushoff from table from airex pad      Knee/Hip Exercises: Supine   Bridges  -- x 12 reps                PT Short Term Goals - 09/02/17 1156      PT SHORT TERM GOAL #1   Title  Independent with initial HEP    Status  Achieved      PT SHORT TERM GOAL #2   Title  Increase gait speed to >/= 1.4 ft /sec to increase safety with limited community ambulation    Status  On-going    Target Date  09/30/17        PT Long Term Goals - 09/02/17 1149      PT LONG TERM GOAL #1   Title  Independent with ongoing/advanced HEP     Status  Achieved      PT LONG TERM GOAL #2   Title  Increase B LE strength to >/= 4/5 to 4/5 for increased ease of mobility    Status  On-going    Target Date  10/28/17      PT LONG TERM GOAL #3   Title  Pt will complete sit to/from stand transfers w/o UE assist demonstrating good control    Status  On-going    Target Date  10/28/17      PT LONG TERM GOAL #4   Title  Decrease TUG time to </= 25 sec to reduce risk for falls during transitional movements    Status  On-going    Target Date  10/28/17      PT LONG TERM GOAL #5   Title  Increase gait speed to >/= 1.8 ft/sec with LRAD to reduce risk for recurrent falls    Status  On-going    Target Date  10/28/17      PT LONG TERM GOAL #6   Title  Increase Berg to >/= 37/56 to reduce fall risk    Status  Revised    Target Date  10/28/17            Plan - 09/05/17 1740    Clinical Impression Statement  Tara Dunn doing well today with no new complaints.  Reports, "I've been trying to get to my exercises".  Tolerated all general LE strengthening and gait training well today.  Continues to require frequent cueing for  increased step length and proper walker spacing with gait training with short-lasting carryover.  Will continue to monitor HEP adherence and progress toward goals in coming visits.    PT Treatment/Interventions  Patient/family education;ADLs/Self Care Home  Management;Therapeutic exercise;Therapeutic activities;Functional mobility training;Gait training;Balance training;Manual techniques;Passive range of motion;Dry needling;Taping;Cryotherapy;Moist Heat    Consulted and Agree with Plan of Care  Patient       Patient will benefit from skilled therapeutic intervention in order to improve the following deficits and impairments:  Decreased strength, Decreased mobility, Decreased balance, Decreased activity tolerance, Difficulty walking, Abnormal gait, Decreased endurance, Decreased safety awareness, Decreased knowledge of precautions  Visit Diagnosis: Hemiplegia and hemiparesis following cerebral infarction affecting right dominant side (HCC)  Muscle weakness (generalized)  Other abnormalities of gait and mobility  Unsteadiness on feet  Difficulty in walking, not elsewhere classified  Cerebrovascular accident (CVA) due to thrombosis of left middle cerebral artery [facility])     Problem List Patient Active Problem List   Diagnosis Date Noted  . Labile blood pressure   . Hypoalbuminemia due to protein-calorie malnutrition ([place])   . Acute blood loss anemia   . AKI (acute kidney injury) ([place])   . Peripheral edema   . Stage 3 chronic kidney disease ([place])   . Benign essential HTN   . History of CVA with residual deficit   . Cerebrovascular accident (CVA) due to thrombosis of left middle cerebral artery ([place])   . Right leg weakness 04/19/2017  . Right hemiparesis ([place]) 04/19/2017  . 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 Joint pain 12/09/2015  . ICD (implantable cardioverter-defibrillator) in place 03/26/2015  . Syncope 03/06/2015  . Obesity (BMI 30-39.9) 01/21/2015  . Excessive daytime sleepiness 08/04/2014  . OSA (obstructive sleep apnea) 08/04/2014  . Essential hypertension 06/13/2014  . Chronic systolic CHF (congestive heart failure) ([place]) 05/20/2014  . Congestive heart disease ([place])   . Chronic combined systolic and diastolic  CHF (congestive heart failure) ([place]) 05/03/2014  . CKD (chronic kidney disease), stage III ([place]) 05/03/2014  . Hx of stroke without residual deficits 05/03/2014    Bess Harvest, PTA 09/05/17 5:48 PM   [facility] [address]  [place], [place], [address] Phone: [phone]   Fax:  [phone]  Name: Tara Dunn MRN: [id number] Date of Birth: July 25, 1940

## 2017-09-07 ENCOUNTER — Ambulatory Visit: Payer: PPO

## 2017-09-07 DIAGNOSIS — M6281 Muscle weakness (generalized): Secondary | ICD-10-CM

## 2017-09-07 DIAGNOSIS — R2681 Unsteadiness on feet: Secondary | ICD-10-CM

## 2017-09-07 DIAGNOSIS — R2689 Other abnormalities of gait and mobility: Secondary | ICD-10-CM

## 2017-09-07 DIAGNOSIS — R262 Difficulty in walking, not elsewhere classified: Secondary | ICD-10-CM

## 2017-09-07 DIAGNOSIS — I69351 Hemiplegia and hemiparesis following cerebral infarction affecting right dominant side: Secondary | ICD-10-CM

## 2017-09-07 NOTE — Therapy (Signed)
[facility] [address]  [place], [place], [address] Phone: [phone]   Fax:  [phone]  Physical Therapy Treatment  Patient Details  Name: Tara Dunn MRN: [id number] Date of Birth: 01-31-1941 Referring Provider: Venancio Poisson, NP   Encounter Date: 09/07/2017  PT End of Session - 09/07/17 1328    Visit Number  15    Number of Visits  25    Date for PT Re-Evaluation  10/28/17    Authorization Type  [employer]    PT Start Time  1315    PT Stop Time  1400    PT Time Calculation (min)  45 min    Equipment Utilized During Treatment  Gait belt    Activity Tolerance  Patient tolerated treatment well;Patient limited by fatigue    Behavior During Therapy  [facility] for tasks assessed/performed       Past Medical History:  Diagnosis Date  . 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 Lovena Le  . Aortic dilatation ([place])    a. 04/2014 CTA chest w/ incidental finding of distal thoracic Ao enlargement of 3.39 mm - f/u needed 04/2015.  . Arthritis    "all over my body"  . Cancer of left breast ([place]) 2009   s/p L mastectomy and chemo.  . Cardiomyopathy ([place])    a. 04/2014 Echo: EF 25-30%, mod conc LVH, possibl antsept HK, Gr1 DD, mod-sev dil LA.  . CHF (congestive heart failure) ([place])   . CKD (chronic kidney disease), stage III ([place])   . Depression   . GERD (gastroesophageal reflux disease)   . Gout   . Heart murmur   . History of stomach ulcers   . Hypertension    a. Dx @ age 42;  b. 04/2014 admission for HTN emergency.  . Obesity (BMI 30-39.9) 01/21/2015  . OSA on CPAP   . Stroke [facility])    a. 3 strokes - last in 1998; "memory problems since"  (03/26/2015)    Past Surgical History:  Procedure Laterality Date  . ABDOMINAL HYSTERECTOMY    . BREAST BIOPSY Left 2008  . CATARACT EXTRACTION, BILATERAL Bilateral   . DILATION AND CURETTAGE OF UTERUS    . EP  IMPLANTABLE DEVICE N/A 03/26/2015   MDT single chamber ICD, Dr. Lovena Le  . LEFT HEART CATHETERIZATION WITH CORONARY ANGIOGRAM N/A 05/07/2014   Procedure: LEFT HEART CATHETERIZATION WITH CORONARY ANGIOGRAM;  Surgeon: Belva Crome, MD;  Location: [facility] CATH LAB;  Service: Cardiovascular;  Laterality: N/A;  . MASTECTOMY Left 2009    There were no vitals filed for this visit.  Subjective Assessment - 09/07/17 1327    Subjective  Pt. noting she has had intermittent L lateral hip pain without known trigger with standing in mornings which started Monday and, "comes and goes".      Pertinent History  CVA - Left PLICA infarct 06/26/8414; remote h/o prior CVAs (most recent in 1998); h/o B knee pain managed with injections    Patient Stated Goals  "walk better"    Currently in Pain?  No/denies    Pain Score  0-No pain    Multiple Pain Sites  No                       [facility] Adult PT Treatment/Exercise - 09/07/17 1347      Ambulation/Gait   Ambulation/Gait  Yes    Ambulation/Gait Assistance  5: Supervision  Ambulation/Gait Assistance Details  Gait training with [employer] focusing on increased step lenght, improved LE clearance, and some cueing required for proper sequencing; only short-lasting carryover with pt. require CGA/supervision for safety     Ambulation Distance (Feet)  90 Feet 2 x 45 ft trials     Assistive device  Straight cane    Gait Pattern  Decreased hip/knee flexion - right;Decreased dorsiflexion - right;Lateral trunk lean to right;Poor foot clearance - left;Poor foot clearance - right    Ambulation Surface  Level;Indoor      Knee/Hip Exercises: Aerobic   Nustep  L5 x 7 min (B LE)      Knee/Hip Exercises: Standing   Forward Step Up  Right;10 reps;Hand Hold: 1;Step Height: 4"    Forward Step Up Limitations  1 HH assistance     Other Standing Knee Exercises  Alternating toe-clears to 8" step x 10 reps with SPC and therapist [place] assist - much difficulty with cueing required to  avoid circumduction       Knee/Hip Exercises: Seated   Long Arc Quad  Right;Left;15 reps;Strengthening;Weights    Long Arc Quad Weight  2 lbs.    Long [employer] Limitations  with adduction ball squeeze     Hamstring Curl  Right;Left;10 reps;Strengthening    Hamstring Limitations  green TB    Sit to Sand  10 reps;without UE support from elevated UBE seat               PT Short Term Goals - 09/02/17 1156      PT SHORT TERM GOAL #1   Title  Independent with initial HEP    Status  Achieved      PT SHORT TERM GOAL #2   Title  Increase gait speed to >/= 1.4 ft /sec to increase safety with limited community ambulation    Status  On-going    Target Date  09/30/17        PT Long Term Goals - 09/02/17 1149      PT LONG TERM GOAL #1   Title  Independent with ongoing/advanced HEP     Status  Achieved      PT LONG TERM GOAL #2   Title  Increase B LE strength to >/= 4/5 to 4/5 for increased ease of mobility    Status  On-going    Target Date  10/28/17      PT LONG TERM GOAL #3   Title  Pt will complete sit to/from stand transfers w/o UE assist demonstrating good control    Status  On-going    Target Date  10/28/17      PT LONG TERM GOAL #4   Title  Decrease TUG time to </= 25 sec to reduce risk for falls during transitional movements    Status  On-going    Target Date  10/28/17      PT LONG TERM GOAL #5   Title  Increase gait speed to >/= 1.8 ft/sec with LRAD to reduce risk for recurrent falls    Status  On-going    Target Date  10/28/17      PT LONG TERM GOAL #6   Title  Increase Berg to >/= 37/56 to reduce fall risk    Status  Revised    Target Date  10/28/17            Plan - 09/07/17 1341    Clinical Impression Statement  Pt. limited by fatigue in today's session without known  trigger requiring increased time to perform all LE strengthening activities with ambulation training with SPC.  Tara Dunn still requiring close CGA/supervision with gait with SPC and  cueing for increased step length and appropriate SPC sequencing with poor foot clearance.  Requiring frequent cueing to maintain repetition count and awareness of proper movement pattern with therex today.  Pt. closely monitor and was pain free and vitals WFL today.  Will continue to progress toward goals.      PT Treatment/Interventions  Patient/family education;ADLs/Self Care Home Management;Therapeutic exercise;Therapeutic activities;Functional mobility training;Gait training;Balance training;Manual techniques;Passive range of motion;Dry needling;Taping;Cryotherapy;Moist Heat    Consulted and Agree with Plan of Care  Patient       Patient will benefit from skilled therapeutic intervention in order to improve the following deficits and impairments:  Decreased strength, Decreased mobility, Decreased balance, Decreased activity tolerance, Difficulty walking, Abnormal gait, Decreased endurance, Decreased safety awareness, Decreased knowledge of precautions  Visit Diagnosis: Hemiplegia and hemiparesis following cerebral infarction affecting right dominant side (HCC)  Muscle weakness (generalized)  Other abnormalities of gait and mobility  Unsteadiness on feet  Difficulty in walking, not elsewhere classified     Problem List Patient Active Problem List   Diagnosis Date Noted  . Labile blood pressure   . Hypoalbuminemia due to protein-calorie malnutrition ([place])   . Acute blood loss anemia   . AKI (acute kidney injury) ([place])   . Peripheral edema   . Stage 3 chronic kidney disease ([place])   . Benign essential HTN   . History of CVA with residual deficit   . Cerebrovascular accident (CVA) due to thrombosis of left middle cerebral artery ([place])   . Right leg weakness 04/19/2017  . Right hemiparesis ([place]) 04/19/2017  . 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 Joint pain 12/09/2015  . ICD (implantable cardioverter-defibrillator) in place 03/26/2015  . Syncope 03/06/2015  . Obesity (BMI  30-39.9) 01/21/2015  . Excessive daytime sleepiness 08/04/2014  . OSA (obstructive sleep apnea) 08/04/2014  . Essential hypertension 06/13/2014  . Chronic systolic CHF (congestive heart failure) ([place]) 05/20/2014  . Congestive heart disease ([place])   . Chronic combined systolic and diastolic CHF (congestive heart failure) ([place]) 05/03/2014  . CKD (chronic kidney disease), stage III ([place]) 05/03/2014  . Hx of stroke without residual deficits 05/03/2014   Bess Harvest, PTA 09/07/17 6:39 PM   [facility] [address]  [place], [place], [address] Phone: [phone]   Fax:  [phone]  Name: Tara Dunn MRN: [id number] Date of Birth: 20-Nov-1940

## 2017-09-08 NOTE — Progress Notes (Signed)
Patient ID: Tara Dunn, female   DOB: 03-26-1940, 77 y.o.   MRN: [id number]     Advanced Heart Failure Clinic Note   PCP: Dr. Ernie Hew HF: Dr. Aundra Dubin   [name] is a 77 yo with long history of HTN, CVAs, CKD, and systolic CHF.    In 3/16, she was admitted a hypertensive emergency and acute CHF.  CTA chest showed no PE.  Echo showed EF 25-30% with moderate LVH.  She was diuresed and had a cardiac catheterization given extensive coronary calcification seen on CTA chest.  Cath showed diffuse moderate CAD, worst was 50-70% mRCA stenosis.   Admitted 04/19/2017 with RLE weakness. Had CVA. Later admitted to CIR. 04/22/2017- 05/11/2017. Entresto and lasix stopped. Discharged to her daughter house.   Today she returns for follow up. Overall feeling fine. Says she wants to try activities with [facility]. Denies SOB/PND/Orthopnea. No chest pain. She is not weighing on consistent basis at home. Appetite ok.Eating pizza and [employer] a few days a week.  No fever or chills. Weight at home pounds. Not moving around much. Taking all medications except she is only taking lasix 40 mg once a day. She stopped taking the afternoon dose about 7 days ago. Marland Kitchen   Optivol: Activity < 30 minutes a day. Fluid well above threshold. No VT    Labs (3/16): K 4.2, creatinine 1.44, HCT 39.7 Labs (06/05/14): K 4.1, creatinine 2.01 Labs (5/16): K 4.7, creatinine 1.56 Labs (7/16): K 4.7, creatinine 1.68, BNP 14, LDL 62 Labs (11/16): K 4.2, creatinine 1.93 Labs (1/17): K 4.6, creatinine 1.65, HCT 39.2 Labs (4/17): K 4.5, creatinine 1.98 Labs (6/17): K 4, creatinine 2 Labs (8/17): K 4.4, creatinine 1.64   Labs (10/17): K 3.9, creatinine 1.5, BNP 15 Labs (1/18): K 4.3, creatinine 1.59 Labs (4/18): LDL 46, HDL 57 Labs (5/18): K 4.6, creatinine 1.55 Labs (10/08/2016): K 4 Creatinine 1.9  Labs (2/82019): K 4.2 Creatinine 2.24 Labs (05/09/2017): K 4.5 Creatinine 1.73, Hgb 9.9    PMH: 1. CVA: 3 prior CVAs, last in 1998.   Thought to be related to HTN. 2. HTN: Long-standing, says she has been hypertensive since age 26. Renal artery dopplers (4/16) were normal.  3. CKD: Suspect related to HTN. 4. Breast cancer: 2009, left mastectomy with chemotherapy.  5. Gout 6. Obese 7. TAH 8. CAD: LHC (3/16) with moderate diffuse disease, worst lesion being 50-70% mid RCA.  9. Cardiomyopathy: Echo (3/16) with EF 25-30%, moderate LVH.  Echo (6/16) with EF 35-40%, moderate LVH, diffuse hypokinesis with septal-lateral dyssynchrony.  Cardiac MRI (12/16) with EF 32%, diffuse hypokinesis, no contrast given due to CKD.  - [employer] ICD 2/17.  - Echo (5/18): EF 40%, diffuse hypokinesis, moderate LVH, normal RV size and systolic function.  -ECHO 04/20/2017- EF 45-50% Grade IDD 10. OSA: Using CPAP.  11. Event monitor (1/17) with NSR, occasional PVCs.   SH: From [place], moved to [place] in 2015 to be near daughter.  Single.  Quit smoking 2009.    FH: HTN runs in family.  Father with MI.   ROS: All systems reviewed and negative except as per HPI.   Current Outpatient Medications  Medication Sig Dispense Refill  . allopurinol (ZYLOPRIM) 100 MG tablet Take 2 tablets (200 mg total) by mouth daily. 30 tablet 0  . atorvastatin (LIPITOR) 80 MG tablet Take 1 tablet (80 mg total) by mouth daily at 6 PM. 30 tablet 3  . carvedilol (COREG) 25 MG tablet TAKE 1 TABLET(25 MG)  BY MOUTH TWICE DAILY 180 tablet 0  . clopidogrel (PLAVIX) 75 MG tablet Take 1 tablet (75 mg total) by mouth daily. 30 tablet 0  . furosemide (LASIX) 20 MG tablet Take 2 tablets (40 mg total) by mouth 2 (two) times daily. 180 tablet 3  . isosorbide-hydrALAZINE (BIDIL) 20-37.5 MG tablet Take 2 tablets by mouth 2 (two) times daily.    . potassium chloride (KLOR-CON) 20 MEQ packet Take 20 mEq by mouth daily. 30 packet 3  . carvedilol (COREG) 25 MG tablet Take 1 tablet (25 mg total) by mouth 2 (two) times daily. 60 tablet 0  . omeprazole (PRILOSEC) 40 MG capsule Take 1 capsule (40 mg  total) by mouth daily. 30 capsule 0  . senna-docusate (SENOKOT-S) 8.6-50 MG tablet Take 1 tablet by mouth at bedtime as needed for mild constipation. (Patient not taking: Reported on 07/06/2017)     No current facility-administered medications for this encounter.      BP 128/80   Pulse 77   Wt 203 lb 3.2 oz (92.2 kg)   SpO2 95%   BMI 36.00 kg/m    Wt Readings from Last 3 Encounters:  09/09/17 203 lb 3.2 oz (92.2 kg)  08/24/17 211 lb 9.6 oz (96 kg)  08/10/17 217 lb 12.8 oz (98.8 kg)   General:  Well appearing. No resp difficulty. amubated in the clinic with a walker HEENT: normal Neck: supple. JVP 9-10 . Carotids 2+ bilat; no bruits. No lymphadenopathy or thryomegaly appreciated. Cor: PMI nondisplaced. Regular rate & rhythm. No rubs, gallops or murmurs. Lungs: clear Abdomen: soft, nontender, nondistended. No hepatosplenomegaly. No bruits or masses. Good bowel sounds. Extremities: no cyanosis, clubbing, rash, R and LLE 1+ edema Neuro: alert & orientedx3, cranial nerves grossly intact. moves all 4 extremities w/o difficulty. Affect pleasant   Assessment/Plan: 1. HTN: Has history of CVA and CKD.  Renal artery dopplers were normal.   Stable.   2. CAD: Moderate diffuse CAD, no severe obstruction.  This did not cause her cardiomyopathy.  -No s/s ischemia - Continue ASA 81 - Continue atorvastatin 40 mg daily, good lipids in 4/18.    3. CKD III:  Creatinine baseline 1.8-2 4. 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  ECHO 04/2017 EF 45-50% . NYHA IIIB. Volume status elevated.  -Changed lasix to 80 mg daily.  - Continue carvedilol 25 mg twice a day.  - Continue bidil 2 tabs three times a day.  5. OSA: Not using CPAP. 6. CVA: On statin and plavix.   7. Deconditioning  Follow up 2 weeks with a BMET Continue Paramedicine. Discussed with [facility] HF Paramedicine.  Greater than 50% of the 25 minute visit was spent in counseling/coordination of care  regarding disease state education, salt/fluid restriction, sliding scale diuretics, and medication compliance.   Darrick Grinder, NP-C  09/09/2017

## 2017-09-09 ENCOUNTER — Encounter (HOSPITAL_COMMUNITY): Payer: Self-pay

## 2017-09-09 ENCOUNTER — Other Ambulatory Visit (HOSPITAL_COMMUNITY): Payer: Self-pay

## 2017-09-09 ENCOUNTER — Ambulatory Visit (HOSPITAL_COMMUNITY)
Admission: RE | Admit: 2017-09-09 | Discharge: 2017-09-09 | Disposition: A | Discharge status: Home / Self Care | Payer: PPO | Source: Ambulatory Visit | Attending: Cardiology | Admitting: Cardiology

## 2017-09-09 VITALS — BP 128/80 | HR 77 | Wt 203.2 lb

## 2017-09-09 DIAGNOSIS — E669 Obesity, unspecified: Secondary | ICD-10-CM | POA: Diagnosis not present

## 2017-09-09 DIAGNOSIS — I10 Essential (primary) hypertension: Secondary | ICD-10-CM | POA: Diagnosis not present

## 2017-09-09 DIAGNOSIS — N183 Chronic kidney disease, stage 3 unspecified: Secondary | ICD-10-CM

## 2017-09-09 DIAGNOSIS — Z9012 Acquired absence of left breast and nipple: Secondary | ICD-10-CM | POA: Diagnosis not present

## 2017-09-09 DIAGNOSIS — Z8249 Family history of ischemic heart disease and other diseases of the circulatory system: Secondary | ICD-10-CM | POA: Diagnosis not present

## 2017-09-09 DIAGNOSIS — M109 Gout, unspecified: Secondary | ICD-10-CM | POA: Insufficient documentation

## 2017-09-09 DIAGNOSIS — Z7982 Long term (current) use of aspirin: Secondary | ICD-10-CM | POA: Insufficient documentation

## 2017-09-09 DIAGNOSIS — I5022 Chronic systolic (congestive) heart failure: Secondary | ICD-10-CM | POA: Insufficient documentation

## 2017-09-09 DIAGNOSIS — Z79899 Other long term (current) drug therapy: Secondary | ICD-10-CM | POA: Diagnosis not present

## 2017-09-09 DIAGNOSIS — G4733 Obstructive sleep apnea (adult) (pediatric): Secondary | ICD-10-CM | POA: Diagnosis not present

## 2017-09-09 DIAGNOSIS — Z853 Personal history of malignant neoplasm of breast: Secondary | ICD-10-CM | POA: Diagnosis not present

## 2017-09-09 DIAGNOSIS — Z90710 Acquired absence of both cervix and uterus: Secondary | ICD-10-CM | POA: Insufficient documentation

## 2017-09-09 DIAGNOSIS — Z95810 Presence of automatic (implantable) cardiac defibrillator: Secondary | ICD-10-CM

## 2017-09-09 DIAGNOSIS — I5042 Chronic combined systolic (congestive) and diastolic (congestive) heart failure: Secondary | ICD-10-CM | POA: Diagnosis not present

## 2017-09-09 DIAGNOSIS — I130 Hypertensive heart and chronic kidney disease with heart failure and stage 1 through stage 4 chronic kidney disease, or unspecified chronic kidney disease: Secondary | ICD-10-CM | POA: Diagnosis not present

## 2017-09-09 DIAGNOSIS — Z7902 Long term (current) use of antithrombotics/antiplatelets: Secondary | ICD-10-CM | POA: Insufficient documentation

## 2017-09-09 DIAGNOSIS — I2510 Atherosclerotic heart disease of native coronary artery without angina pectoris: Secondary | ICD-10-CM | POA: Insufficient documentation

## 2017-09-09 DIAGNOSIS — I429 Cardiomyopathy, unspecified: Secondary | ICD-10-CM | POA: Insufficient documentation

## 2017-09-09 DIAGNOSIS — Z6836 Body mass index (BMI) 36.0-36.9, adult: Secondary | ICD-10-CM | POA: Insufficient documentation

## 2017-09-09 DIAGNOSIS — Z8673 Personal history of transient ischemic attack (TIA), and cerebral infarction without residual deficits: Secondary | ICD-10-CM | POA: Insufficient documentation

## 2017-09-09 DIAGNOSIS — Z87891 Personal history of nicotine dependence: Secondary | ICD-10-CM | POA: Insufficient documentation

## 2017-09-09 DIAGNOSIS — Z9221 Personal history of antineoplastic chemotherapy: Secondary | ICD-10-CM | POA: Diagnosis not present

## 2017-09-09 DIAGNOSIS — I5020 Unspecified systolic (congestive) heart failure: Secondary | ICD-10-CM | POA: Diagnosis present

## 2017-09-09 MED ORDER — FUROSEMIDE 20 MG PO TABS: 80.0000 mg | ORAL_TABLET | Freq: Every day | ORAL | 3 refills | Status: DC

## 2017-09-09 NOTE — Progress Notes (Signed)
Paramedicine Encounter   Patient ID: Tara Dunn , female,   DOB: 10/08/1940,76 y.o.,  MRN: [id number]  Tara Dunn was seen at the HF clinic today with Darrick Grinder, NP. Per the Optivol reading, her fluid was up substantially. The patient stated she has been mostly compliant with her medications but according to her daughter, she had stopped taking the evening dose of furosemide because she "stopped being reminded." Per Amy, Tara Dunn is to begin taking 80 mg of furosemide daily and should take it all in the morning to avoid forgetting in the future. Additionally, Amy would like Tara Dunn to start increasing her activity level. I will relay this to [facility], who Tara Dunn said was supposed to be bringing her an activities list last week. Nothing further was necessary during this visit.   Tara Dunn, EMT 09/09/2017   ACTION: Next visit planned for 1 week

## 2017-09-09 NOTE — Patient Instructions (Signed)
INCREASE Lasix to 80 mg (4 tabs) daily  .Your physician recommends that you schedule a follow-up appointment in: 2-3 weeks with Amy Clegg,NP   Do the following things EVERYDAY: 1) Weigh yourself in the morning before breakfast. Write it down and keep it in a log. 2) Take your medicines as prescribed 3) Eat low salt foods-Limit salt (sodium) to 2000 mg per day.  4) Stay as active as you can everyday 5) Limit all fluids for the day to less than 2 liters

## 2017-09-10 DIAGNOSIS — I5022 Chronic systolic (congestive) heart failure: Secondary | ICD-10-CM | POA: Diagnosis not present

## 2017-09-10 DIAGNOSIS — G4733 Obstructive sleep apnea (adult) (pediatric): Secondary | ICD-10-CM | POA: Diagnosis not present

## 2017-09-10 DIAGNOSIS — G8191 Hemiplegia, unspecified affecting right dominant side: Secondary | ICD-10-CM | POA: Diagnosis not present

## 2017-09-13 ENCOUNTER — Encounter: Payer: Self-pay | Admitting: Physical Therapy

## 2017-09-13 ENCOUNTER — Other Ambulatory Visit (HOSPITAL_COMMUNITY): Payer: Self-pay

## 2017-09-13 ENCOUNTER — Ambulatory Visit: Payer: PPO | Attending: Adult Health | Admitting: Physical Therapy

## 2017-09-13 DIAGNOSIS — I69351 Hemiplegia and hemiparesis following cerebral infarction affecting right dominant side: Secondary | ICD-10-CM | POA: Diagnosis not present

## 2017-09-13 DIAGNOSIS — I509 Heart failure, unspecified: Secondary | ICD-10-CM | POA: Diagnosis not present

## 2017-09-13 DIAGNOSIS — R2681 Unsteadiness on feet: Secondary | ICD-10-CM | POA: Insufficient documentation

## 2017-09-13 DIAGNOSIS — R2689 Other abnormalities of gait and mobility: Secondary | ICD-10-CM | POA: Insufficient documentation

## 2017-09-13 DIAGNOSIS — R262 Difficulty in walking, not elsewhere classified: Secondary | ICD-10-CM | POA: Diagnosis not present

## 2017-09-13 DIAGNOSIS — M109 Gout, unspecified: Secondary | ICD-10-CM | POA: Diagnosis not present

## 2017-09-13 DIAGNOSIS — I10 Essential (primary) hypertension: Secondary | ICD-10-CM | POA: Diagnosis not present

## 2017-09-13 DIAGNOSIS — E876 Hypokalemia: Secondary | ICD-10-CM | POA: Diagnosis not present

## 2017-09-13 DIAGNOSIS — M6281 Muscle weakness (generalized): Secondary | ICD-10-CM

## 2017-09-13 DIAGNOSIS — M1000 Idiopathic gout, unspecified site: Secondary | ICD-10-CM | POA: Diagnosis not present

## 2017-09-13 DIAGNOSIS — N181 Chronic kidney disease, stage 1: Secondary | ICD-10-CM | POA: Diagnosis not present

## 2017-09-13 NOTE — Progress Notes (Signed)
Paramedicine Encounter    Patient ID: Tara Dunn, female    DOB: 1940/02/22, 77 y.o.   MRN: [id number]    Patient Care Team: Fanny Bien, MD as PCP - General (Family Medicine)  Patient Active Problem List   Diagnosis Date Noted  . Labile blood pressure   . Hypoalbuminemia due to protein-calorie malnutrition ([place])   . Acute blood loss anemia   . AKI (acute kidney injury) ([place])   . Peripheral edema   . Stage 3 chronic kidney disease ([place])   . Benign essential HTN   . History of CVA with residual deficit   . Cerebrovascular accident (CVA) due to thrombosis of left middle cerebral artery ([place])   . Right leg weakness 04/19/2017  . Right hemiparesis ([place]) 04/19/2017  . 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 Joint pain 12/09/2015  . ICD (implantable cardioverter-defibrillator) in place 03/26/2015  . Syncope 03/06/2015  . Obesity (BMI 30-39.9) 01/21/2015  . Excessive daytime sleepiness 08/04/2014  . OSA (obstructive sleep apnea) 08/04/2014  . Essential hypertension 06/13/2014  . Chronic systolic CHF (congestive heart failure) ([place]) 05/20/2014  . Congestive heart disease ([place])   . Chronic combined systolic and diastolic CHF (congestive heart failure) ([place]) 05/03/2014  . CKD (chronic kidney disease), stage III ([place]) 05/03/2014  . Hx of stroke without residual deficits 05/03/2014    Current Outpatient Medications:  .  allopurinol (ZYLOPRIM) 100 MG tablet, Take 2 tablets (200 mg total) by mouth daily., Disp: 30 tablet, Rfl: 0 .  atorvastatin (LIPITOR) 80 MG tablet, Take 1 tablet (80 mg total) by mouth daily at 6 PM., Disp: 30 tablet, Rfl: 3 .  carvedilol (COREG) 25 MG tablet, Take 1 tablet (25 mg total) by mouth 2 (two) times daily., Disp: 60 tablet, Rfl: 0 .  carvedilol (COREG) 25 MG tablet, TAKE 1 TABLET(25 MG) BY MOUTH TWICE DAILY, Disp: 180 tablet, Rfl: 0 .  clopidogrel (PLAVIX) 75 MG tablet, Take 1 tablet (75 mg total) by mouth daily., Disp: 30 tablet, Rfl: 0 .   furosemide (LASIX) 20 MG tablet, Take 4 tablets (80 mg total) by mouth daily., Disp: 120 tablet, Rfl: 3 .  isosorbide-hydrALAZINE (BIDIL) 20-37.5 MG tablet, Take 2 tablets by mouth 2 (two) times daily., Disp: , Rfl:  .  omeprazole (PRILOSEC) 40 MG capsule, Take 1 capsule (40 mg total) by mouth daily., Disp: 30 capsule, Rfl: 0 .  potassium chloride (KLOR-CON) 20 MEQ packet, Take 20 mEq by mouth daily., Disp: 30 packet, Rfl: 3 .  senna-docusate (SENOKOT-S) 8.6-50 MG tablet, Take 1 tablet by mouth at bedtime as needed for mild constipation. (Patient not taking: Reported on 07/06/2017), Disp: , Rfl:  Allergies  Allergen Reactions  . Shellfish Allergy Hives     Social History   Socioeconomic History  . Marital status: Widowed    Spouse name: Not on file  . Number of children: Not on file  . Years of education: Not on file  . Highest education level: Not on file  Occupational History  . Not on file  Social Needs  . Financial resource strain: Not on file  . Food insecurity:    Worry: Not on file    Inability: Not on file  . Transportation needs:    Medical: Not on file    Non-medical: Not on file  Tobacco Use  . Smoking status: Former Smoker    Packs/day: 0.50    Years: 40.00    Pack years: 20.00    Types: Cigarettes  .  Smokeless tobacco: Never Used  . Tobacco comment: Quit in 2009.  Substance and Sexual Activity  . Alcohol use: No    Alcohol/week: 0.0 oz    Comment: "drank occasionally when I was young; last drink was in the late 1990s"  . Drug use: No  . Sexual activity: Never  Lifestyle  . Physical activity:    Days per week: Not on file    Minutes per session: Not on file  . Stress: Not on file  Relationships  . Social connections:    Talks on phone: Not on file    Gets together: Not on file    Attends religious service: Not on file    Active member of club or organization: Not on file    Attends meetings of clubs or organizations: Not on file    Relationship  status: Not on file  . Intimate partner violence:    Fear of current or ex partner: Not on file    Emotionally abused: Not on file    Physically abused: Not on file    Forced sexual activity: Not on file  Other Topics Concern  . Not on file  Social History Narrative   Lives alone in an independent living facility.  Education: 10th grade.     Retired from [employer] work.  Moved here from [place] in 2015.    Physical Exam  Pulmonary/Chest: Effort normal. No respiratory distress.  Abdominal: Soft.  Musculoskeletal: She exhibits no edema.  Skin: Skin is warm and dry. She is not diaphoretic.        Future Appointments  Date Time Provider [place]  09/13/2017  2:00 PM Percival Spanish, PT [facility]  09/16/2017  9:30 AM Percival Spanish, PT [facility]  09/20/2017 10:15 AM Percival Spanish, PT [facility]  09/22/2017 11:00 AM [facility] PA/NP [facility] None  09/23/2017 10:15 AM Percival Spanish, PT [facility]  09/27/2017 10:15 AM Percival Spanish, PT [facility]  09/30/2017 10:15 AM Bess Harvest, PTA [facility]  10/04/2017 10:15 AM Percival Spanish, PT [facility]  10/11/2017 10:15 AM Bess Harvest, PTA [facility]  10/18/2017 10:15 AM Percival Spanish, PT [facility]  10/24/2017  2:45 PM Venancio Poisson, NP [facility] None  10/25/2017 10:15 AM Percival Spanish, PT [facility]     BP 104/66 (BP Location: Right Arm, Patient Position: Sitting, Cuff Size: Large)   Pulse 60   Weight yesterday-didn't weigh Last visit weight-203 (clinic)  ATF [name] x4 walking around in the kitchen.  Pt has physical therapy @2  today; states that her knee hurts. I advised pt to talk with her PT about getting shoes with a thicker sole for cushion.  Pt stated that she became SOB getting her blood checked this am; she ate breakfast and the dizziness went away.  Pt's daughter stated that Amy stated during last visit that pt is only walking around about 30 mins per day. Pt denies SOB, chest pain and  dizziness at this time.  Pt stated that she doesn't have the energy to do much throughout the day. Pt's daughter stated that "this has gotten worse since her son died a couple of months ago"  I advised pt and her daughter to mention this to Dr. Willette Pa this week during her appointment. rx bottles verified and pill boxes refilled according to the changes made during her last clinic visit.  Pt stated that she has been thinking about going into a nursing home; I  gave pt information on assisted living apartments in this area.    Medication ordered:  Omeprazole Atorvastatin Allopurinol Clopidogrel *pharmacist @ [facility] confirmed that Bidil is free for her  [place], EMT Paramedic [phone] 09/13/2017    ACTION: Home visit completed

## 2017-09-13 NOTE — Therapy (Addendum)
[facility] [address]  [place], [place], [address] Phone: [phone]   Fax:  [phone]  Physical Therapy Treatment  Patient Details  Name: Tara Dunn MRN: [id number] Date of Birth: 01/22/1941 Referring Provider: Venancio Poisson, NP   Encounter Date: 09/13/2017  PT End of Session - 09/13/17 1406    Visit Number  16    Number of Visits  25    Date for PT Re-Evaluation  10/28/17    Authorization Type  [employer]    PT Start Time  1602    PT Stop Time  1644    PT Time Calculation (min)  42 min    Equipment Utilized During Treatment  Gait belt    Activity Tolerance  Patient tolerated treatment well;Patient limited by fatigue    Behavior During Therapy  [facility] for tasks assessed/performed       Past Medical History:  Diagnosis Date  . 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 Lovena Le  . Aortic dilatation ([place])    a. 04/2014 CTA chest w/ incidental finding of distal thoracic Ao enlargement of 3.39 mm - f/u needed 04/2015.  . Arthritis    "all over my body"  . Cancer of left breast ([place]) 2009   s/p L mastectomy and chemo.  . Cardiomyopathy ([place])    a. 04/2014 Echo: EF 25-30%, mod conc LVH, possibl antsept HK, Gr1 DD, mod-sev dil LA.  . CHF (congestive heart failure) ([place])   . CKD (chronic kidney disease), stage III ([place])   . Depression   . GERD (gastroesophageal reflux disease)   . Gout   . Heart murmur   . History of stomach ulcers   . Hypertension    a. Dx @ age 38;  b. 04/2014 admission for HTN emergency.  . Obesity (BMI 30-39.9) 01/21/2015  . OSA on CPAP   . Stroke [facility])    a. 3 strokes - last in 1998; "memory problems since"  (03/26/2015)    Past Surgical History:  Procedure Laterality Date  . ABDOMINAL HYSTERECTOMY    . BREAST BIOPSY Left 2008  . CATARACT EXTRACTION, BILATERAL Bilateral   . DILATION AND CURETTAGE OF UTERUS    . EP  IMPLANTABLE DEVICE N/A 03/26/2015   MDT single chamber ICD, Dr. Lovena Le  . LEFT HEART CATHETERIZATION WITH CORONARY ANGIOGRAM N/A 05/07/2014   Procedure: LEFT HEART CATHETERIZATION WITH CORONARY ANGIOGRAM;  Surgeon: Belva Crome, MD;  Location[facility] LAB;  Service: Cardiovascular;  Laterality: N/A;  . MASTECTOMY Left 2009    There were no vitals filed for this visit.  Subjective Assessment - 09/13/17 1406    Subjective  pt noting L hip pain today that pervents her from walking. Pt states that her daughter got her a cane recently but she has not yet used it.     Pertinent History  CVA - Left PLICA infarct 6/78/9381; remote h/o prior CVAs (most recent in 1998); h/o B knee pain managed with injections    How long can you sit comfortably?  1 hour    How long can you stand comfortably?  20 min     How long can you walk comfortably?  20 min    Patient Stated Goals  "walk better"    Currently in Pain?  Yes    Pain Score  7     Pain Location  Hip    Pain Orientation  Left    Pain  Descriptors / Indicators  Aching                       [facility] Adult PT Treatment/Exercise - 09/13/17 1410      Ambulation/Gait   Ambulation/Gait  Yes    Ambulation/Gait Assistance  5: Supervision;4: Min guard    Ambulation/Gait Assistance Details  Gait training with [place]. Pt tried using cane in R UE but PT noticed quality of gait and stability of gait was better in L UE due to weakness in R LE, and therefore suggested for pt to utilize in L UE. Pt required continuous VC for cane placement and to not let cane get too far ahead of pt, as well as performing sit to stands without bearing weight through the cane. Pt required cueing for adequate step length with each LE when walking with walker and again when walking with SPC. Amb 2 x 45 with SPC and CGA to supervision from therapist.     Ambulation Distance (Feet)  90 Feet    Assistive device  Straight cane    Gait Pattern  Decreased step length -  left;Decreased arm swing - left;Decreased step length - right;Decreased arm swing - right;Step-through pattern;Decreased stride length;Decreased hip/knee flexion - right;Decreased dorsiflexion - right;Decreased hip/knee flexion - left;Poor foot clearance - right;Poor foot clearance - left    Ambulation Surface  Level;Indoor;Unlevel    Gait Comments  Pt walked 3 x 10 ft across red neuro mat (unlevel surface). Pt expressed significant instability with activity and required min assist from therapist for R knee guarding as well as VC for cane placement and foot clearance. Pt unable to sequence cane in the L UE with R LE and would place cane before moving R LE. With continued VC from therapist pt was able to achieve patterning for 1-2 steps but was unable to maintain. Pt required intermittent Min to mod assist from therapist for trunk stability during exercise.       Knee/Hip Exercises: Aerobic   Nustep  L4 x 6 min (LE only)      Ambulation   Ambulation/Gait Assistance Details  Tactile cues for initiation;Verbal cues for technique;Verbal cues for precautions/safety;Verbal cues for gait pattern;Verbal cues for safe use of DME/AE;Manual facilitation for placement          Balance Exercises - 09/13/17 1834      Balance Exercises: Standing   Stepping Strategy  Anterior;UE support;Other reps (comment) 4 reps over 3 foam rollers with SPC; CGA to min         PT Education - 09/13/17 1839    Education provided  Yes    Education Details  Pt instructed in proper form when amb with single point cane, and sit to stand technique with SPC including reaching both hands behind for the chair instead of on the cane.     Person(s) Educated  Patient    Methods  Explanation;Demonstration;Verbal cues    Comprehension  Verbalized understanding;Returned demonstration;Verbal cues required;Need further instruction       PT Short Term Goals - 09/02/17 1156      PT SHORT TERM GOAL #1   Title  Independent with  initial HEP    Status  Achieved      PT SHORT TERM GOAL #2   Title  Increase gait speed to >/= 1.4 ft /sec to increase safety with limited community ambulation    Status  On-going    Target Date  09/30/17  PT Long Term Goals - 09/02/17 1149      PT LONG TERM GOAL #1   Title  Independent with ongoing/advanced HEP     Status  Achieved      PT LONG TERM GOAL #2   Title  Increase B LE strength to >/= 4/5 to 4/5 for increased ease of mobility    Status  On-going    Target Date  10/28/17      PT LONG TERM GOAL #3   Title  Pt will complete sit to/from stand transfers w/o UE assist demonstrating good control    Status  On-going    Target Date  10/28/17      PT LONG TERM GOAL #4   Title  Decrease TUG time to </= 25 sec to reduce risk for falls during transitional movements    Status  On-going    Target Date  10/28/17      PT LONG TERM GOAL #5   Title  Increase gait speed to >/= 1.8 ft/sec with LRAD to reduce risk for recurrent falls    Status  On-going    Target Date  10/28/17      PT LONG TERM GOAL #6   Title  Increase Berg to >/= 37/56 to reduce fall risk    Status  Revised    Target Date  10/28/17            Plan - 09/13/17 1839    Clinical Impression Statement  Kamia demonstrates more balance abilities with each visit and steadiness during gait. When amb on level surface she is supervision to [place] from therapist for endurance and fatigue related issues, which limit her significantly in performing functional activities and therapeutic exercise, and will limit the overall amount of progress she will be able to make in amb community distances with [facility]. Session today focused on gait training in order progress toward meeting pt goal of amb with [facility] for household amb distances, including to mailbox and back. Pt continues to demonstrate decreased foot clearance and instability with SLS on R LE, and will continue to benefit from physical therapy to address these  impairments.     PT Treatment/Interventions  Patient/family education;ADLs/Self Care Home Management;Therapeutic exercise;Therapeutic activities;Functional mobility training;Gait training;Balance training;Manual techniques;Passive range of motion;Dry needling;Taping;Cryotherapy;Moist Heat    Consulted and Agree with Plan of Care  Patient       Patient will benefit from skilled therapeutic intervention in order to improve the following deficits and impairments:  Decreased strength, Decreased mobility, Decreased balance, Decreased activity tolerance, Difficulty walking, Abnormal gait, Decreased endurance, Decreased safety awareness, Decreased knowledge of precautions  Visit Diagnosis: Hemiplegia and hemiparesis following cerebral infarction affecting right dominant side (HCC)  Muscle weakness (generalized)  Other abnormalities of gait and mobility  Unsteadiness on feet  Difficulty in walking, not elsewhere classified     Problem List Patient Active Problem List   Diagnosis Date Noted  . Labile blood pressure   . Hypoalbuminemia due to protein-calorie malnutrition ([place])   . Acute blood loss anemia   . AKI (acute kidney injury) ([place])   . Peripheral edema   . Stage 3 chronic kidney disease ([place])   . Benign essential HTN   . History of CVA with residual deficit   . Cerebrovascular accident (CVA) due to thrombosis of left middle cerebral artery ([place])   . Right leg weakness 04/19/2017  . Right hemiparesis ([place]) 04/19/2017  . 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 Joint pain 12/09/2015  .  ICD (implantable cardioverter-defibrillator) in place 03/26/2015  . Syncope 03/06/2015  . Obesity (BMI 30-39.9) 01/21/2015  . Excessive daytime sleepiness 08/04/2014  . OSA (obstructive sleep apnea) 08/04/2014  . Essential hypertension 06/13/2014  . Chronic systolic CHF (congestive heart failure) ([place]) 05/20/2014  . Congestive heart disease ([place])   . Chronic combined systolic and diastolic  CHF (congestive heart failure) ([place]) 05/03/2014  . CKD (chronic kidney disease), stage III ([place]) 05/03/2014  . Hx of stroke without residual deficits 05/03/2014    Shirline Frees, SPT  09/13/2017, 7:09 PM  [facility] [address]  [name], [place], [address] Phone: [phone]   Fax:  [phone]  Name: Tara Dunn MRN: [id number] Date of Birth: Feb 08, 1941

## 2017-09-15 DIAGNOSIS — I10 Essential (primary) hypertension: Secondary | ICD-10-CM | POA: Diagnosis not present

## 2017-09-15 DIAGNOSIS — I509 Heart failure, unspecified: Secondary | ICD-10-CM | POA: Diagnosis not present

## 2017-09-15 DIAGNOSIS — Z6834 Body mass index (BMI) 34.0-34.9, adult: Secondary | ICD-10-CM | POA: Diagnosis not present

## 2017-09-15 DIAGNOSIS — M109 Gout, unspecified: Secondary | ICD-10-CM | POA: Diagnosis not present

## 2017-09-15 DIAGNOSIS — N181 Chronic kidney disease, stage 1: Secondary | ICD-10-CM | POA: Diagnosis not present

## 2017-09-15 DIAGNOSIS — F331 Major depressive disorder, recurrent, moderate: Secondary | ICD-10-CM | POA: Diagnosis not present

## 2017-09-15 DIAGNOSIS — M170 Bilateral primary osteoarthritis of knee: Secondary | ICD-10-CM | POA: Diagnosis not present

## 2017-09-16 ENCOUNTER — Ambulatory Visit: Payer: PPO | Admitting: Physical Therapy

## 2017-09-16 DIAGNOSIS — R2681 Unsteadiness on feet: Secondary | ICD-10-CM

## 2017-09-16 DIAGNOSIS — I69351 Hemiplegia and hemiparesis following cerebral infarction affecting right dominant side: Secondary | ICD-10-CM | POA: Diagnosis not present

## 2017-09-16 DIAGNOSIS — R262 Difficulty in walking, not elsewhere classified: Secondary | ICD-10-CM

## 2017-09-16 DIAGNOSIS — M6281 Muscle weakness (generalized): Secondary | ICD-10-CM

## 2017-09-16 DIAGNOSIS — R2689 Other abnormalities of gait and mobility: Secondary | ICD-10-CM

## 2017-09-16 NOTE — Therapy (Signed)
[facility] [address]  [place], [place], [address] Phone: [phone]   Fax:  [phone]  Physical Therapy Treatment  Patient Details  Name: Tara Dunn MRN: [id number] Date of Birth: September 14, 1940 Referring Provider: Venancio Poisson, NP   Encounter Date: 09/16/2017  PT End of Session - 09/16/17 1224    Visit Number  17    Number of Visits  25    Date for PT Re-Evaluation  10/28/17    Authorization Type  [employer]    PT Start Time  0931    PT Stop Time  1011    PT Time Calculation (min)  40 min    Equipment Utilized During Treatment  Gait belt    Activity Tolerance  Patient tolerated treatment well;Patient limited by fatigue    Behavior During Therapy  [facility] for tasks assessed/performed       Past Medical History:  Diagnosis Date  . 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 Lovena Le  . Aortic dilatation ([place])    a. 04/2014 CTA chest w/ incidental finding of distal thoracic Ao enlargement of 3.39 mm - f/u needed 04/2015.  . Arthritis    "all over my body"  . Cancer of left breast ([place]) 2009   s/p L mastectomy and chemo.  . Cardiomyopathy ([place])    a. 04/2014 Echo: EF 25-30%, mod conc LVH, possibl antsept HK, Gr1 DD, mod-sev dil LA.  . CHF (congestive heart failure) ([place])   . CKD (chronic kidney disease), stage III ([place])   . Depression   . GERD (gastroesophageal reflux disease)   . Gout   . Heart murmur   . History of stomach ulcers   . Hypertension    a. Dx @ age 2;  b. 04/2014 admission for HTN emergency.  . Obesity (BMI 30-39.9) 01/21/2015  . OSA on CPAP   . Stroke [facility])    a. 3 strokes - last in 1998; "memory problems since"  (03/26/2015)    Past Surgical History:  Procedure Laterality Date  . ABDOMINAL HYSTERECTOMY    . BREAST BIOPSY Left 2008  . CATARACT EXTRACTION, BILATERAL Bilateral   . DILATION AND CURETTAGE OF UTERUS    . EP  IMPLANTABLE DEVICE N/A 03/26/2015   MDT single chamber ICD, Dr. Lovena Le  . LEFT HEART CATHETERIZATION WITH CORONARY ANGIOGRAM N/A 05/07/2014   Procedure: LEFT HEART CATHETERIZATION WITH CORONARY ANGIOGRAM;  Surgeon: Belva Crome, MD;  Location: [facility] CATH LAB;  Service: Cardiovascular;  Laterality: N/A;  . MASTECTOMY Left 2009    There were no vitals filed for this visit.                    [place] Adult PT Treatment/Exercise - 09/16/17 0001      Ambulation/Gait   Ambulation/Gait  Yes    Ambulation/Gait Assistance  6: Modified independent (Device/Increase time);5: Supervision    Ambulation/Gait Assistance Details  Gait training with metronome set to 50-60 bpm considering pt fatigue     Ambulation Distance (Feet)  135 Feet    Assistive device  Straight cane    Gait Pattern  Decreased arm swing - right;Decreased arm swing - left;Lateral hip instability;Poor foot clearance - left;Poor foot clearance - right    Ambulation Surface  Level;Indoor    Gait velocity  1.05 ft/s   with SPC and metronome to increase cadence   Gait Comments  All gait activities/testing of gait speed performed today  with metronome set to between 50-60 bpm to increase cadence       Exercises   Exercises  Knee/Hip      Knee/Hip Exercises: Standing   Heel Raises  Both;2 sets;20 reps    Heel Raises Limitations  With metronome set to 35 bpm to increase speed of exercise; simulate walking; and improve power production    Forward Step Up  Both;20 reps;Hand Hold: 2;Step Height: 6"    Forward Step Up Limitations  2 x 10 reps alternating with each LE. Metronome used between 30 and 40 bpms to increase cadence and intensity of activity.       Knee/Hip Exercises: Seated   Sit to Sand  10 reps;without UE support   to chair with AirEx pad              PT Short Term Goals - 09/02/17 1156      PT SHORT TERM GOAL #1   Title  Independent with initial HEP    Status  Achieved      PT SHORT TERM GOAL #2    Title  Increase gait speed to >/= 1.4 ft /sec to increase safety with limited community ambulation    Status  On-going    Target Date  09/30/17        PT Long Term Goals - 09/02/17 1149      PT LONG TERM GOAL #1   Title  Independent with ongoing/advanced HEP     Status  Achieved      PT LONG TERM GOAL #2   Title  Increase B LE strength to >/= 4/5 to 4/5 for increased ease of mobility    Status  On-going    Target Date  10/28/17      PT LONG TERM GOAL #3   Title  Pt will complete sit to/from stand transfers w/o UE assist demonstrating good control    Status  On-going    Target Date  10/28/17      PT LONG TERM GOAL #4   Title  Decrease TUG time to </= 25 sec to reduce risk for falls during transitional movements    Status  On-going    Target Date  10/28/17      PT LONG TERM GOAL #5   Title  Increase gait speed to >/= 1.8 ft/sec with LRAD to reduce risk for recurrent falls    Status  On-going    Target Date  10/28/17      PT LONG TERM GOAL #6   Title  Increase Berg to >/= 37/56 to reduce fall risk    Status  Revised    Target Date  10/28/17            Plan - 09/16/17 1224    Clinical Impression Statement  Tra[place] demonstrates improved ability to improve cadence with gait today with SPC. Metronome used throughout session to encourage pt to have faster movements during exercises including calf raises and gait to increase gait speed and decrease risk for falls. With increased cadence pt demonstrates increased fatigue, and will benefit from continued LE strengthening and gait activities to improve efficiency of gait, increase gait speed, and progress toward functional goals. At this time, she continues to demonstrate significant cardiovascular limitations that limit her ability to amb for increased distance with adequate cadence.     Rehab Potential  Good    PT Treatment/Interventions  Patient/family education;ADLs/Self Care Home Management;Therapeutic exercise;Therapeutic  activities;Functional mobility training;Gait training;Balance training;Manual techniques;Passive range of  motion;Dry needling;Taping;Cryotherapy;Moist Heat    PT Next Visit Plan  Balance activities to improve steadiness of gait     Consulted and Agree with Plan of Care  Patient       Patient will benefit from skilled therapeutic intervention in order to improve the following deficits and impairments:  Decreased strength, Decreased mobility, Decreased balance, Decreased activity tolerance, Difficulty walking, Abnormal gait, Decreased endurance, Decreased safety awareness, Decreased knowledge of precautions  Visit Diagnosis: Hemiplegia and hemiparesis following cerebral infarction affecting right dominant side (HCC)  Muscle weakness (generalized)  Other abnormalities of gait and mobility  Unsteadiness on feet  Difficulty in walking, not elsewhere classified     Problem List Patient Active Problem List   Diagnosis Date Noted  . Labile blood pressure   . Hypoalbuminemia due to protein-calorie malnutrition ([place])   . Acute blood loss anemia   . AKI (acute kidney injury) ([place])   . Peripheral edema   . Stage 3 chronic kidney disease ([place])   . Benign essential HTN   . History of CVA with residual deficit   . Cerebrovascular accident (CVA) due to thrombosis of left middle cerebral artery ([place])   . Right leg weakness 04/19/2017  . Right hemiparesis ([place]) 04/19/2017  . 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 Joint pain 12/09/2015  . ICD (implantable cardioverter-defibrillator) in place 03/26/2015  . Syncope 03/06/2015  . Obesity (BMI 30-39.9) 01/21/2015  . Excessive daytime sleepiness 08/04/2014  . OSA (obstructive sleep apnea) 08/04/2014  . Essential hypertension 06/13/2014  . Chronic systolic CHF (congestive heart failure) ([place]) 05/20/2014  . Congestive heart disease ([place])   . Chronic combined systolic and diastolic CHF (congestive heart failure) ([place]) 05/03/2014  . CKD  (chronic kidney disease), stage III ([place]) 05/03/2014  . Hx of stroke without residual deficits 05/03/2014    Shirline Frees, SPT 09/16/2017, 12:27 PM  [facility] [address]  [place], [place], [address] Phone: [phone]   Fax:  [phone]  Name: Tara Dunn MRN: [id number] Date of Birth: [birth year]

## 2017-09-20 ENCOUNTER — Ambulatory Visit: Payer: PPO | Admitting: Physical Therapy

## 2017-09-20 ENCOUNTER — Encounter: Payer: Self-pay | Admitting: Physical Therapy

## 2017-09-20 DIAGNOSIS — R262 Difficulty in walking, not elsewhere classified: Secondary | ICD-10-CM

## 2017-09-20 DIAGNOSIS — I69351 Hemiplegia and hemiparesis following cerebral infarction affecting right dominant side: Secondary | ICD-10-CM

## 2017-09-20 DIAGNOSIS — R2681 Unsteadiness on feet: Secondary | ICD-10-CM

## 2017-09-20 DIAGNOSIS — M6281 Muscle weakness (generalized): Secondary | ICD-10-CM

## 2017-09-20 DIAGNOSIS — R2689 Other abnormalities of gait and mobility: Secondary | ICD-10-CM

## 2017-09-20 NOTE — Therapy (Addendum)
[facility] [address]  [place], [place], [address] Phone: [phone]   Fax:  [phone]  Physical Therapy Treatment  Patient Details  Name: Tara Dunn MRN: [id number] Date of Birth: 1940/09/25 Referring Provider: Venancio Poisson, NP   Encounter Date: 09/20/2017  PT End of Session - 09/20/17 1025    Visit Number  18    Number of Visits  25    Date for PT Re-Evaluation  10/28/17    Authorization Type  [employer]    PT Start Time  1018    PT Stop Time  1100    PT Time Calculation (min)  42 min    Equipment Utilized During Treatment  Gait belt    Activity Tolerance  Patient tolerated treatment well;Patient limited by fatigue    Behavior During Therapy  WFL for tasks assessed/performed       Past Medical History:  Diagnosis Date  . 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 Lovena Le  . Aortic dilatation ([place])    a. 04/2014 CTA chest w/ incidental finding of distal thoracic Ao enlargement of 3.39 mm - f/u needed 04/2015.  . Arthritis    "all over my body"  . Cancer of left breast ([place]) 2009   s/p L mastectomy and chemo.  . Cardiomyopathy ([place])    a. 04/2014 Echo: EF 25-30%, mod conc LVH, possibl antsept HK, Gr1 DD, mod-sev dil LA.  . CHF (congestive heart failure) ([place])   . CKD (chronic kidney disease), stage III ([place])   . Depression   . GERD (gastroesophageal reflux disease)   . Gout   . Heart murmur   . History of stomach ulcers   . Hypertension    a. Dx @ age 61;  b. 04/2014 admission for HTN emergency.  . Obesity (BMI 30-39.9) 01/21/2015  . OSA on CPAP   . Stroke [facility])    a. 3 strokes - last in 1998; "memory problems since"  (03/26/2015)    Past Surgical History:  Procedure Laterality Date  . ABDOMINAL HYSTERECTOMY    . BREAST BIOPSY Left 2008  . CATARACT EXTRACTION, BILATERAL Bilateral   . DILATION AND CURETTAGE OF UTERUS    . EP  IMPLANTABLE DEVICE N/A 03/26/2015   MDT single chamber ICD, Dr. Lovena Le  . LEFT HEART CATHETERIZATION WITH CORONARY ANGIOGRAM N/A 05/07/2014   Procedure: LEFT HEART CATHETERIZATION WITH CORONARY ANGIOGRAM;  Surgeon: Belva Crome, MD;  Location: [facility] CATH LAB;  Service: Cardiovascular;  Laterality: N/A;  . MASTECTOMY Left 2009    There were no vitals filed for this visit.  Subjective Assessment - 09/20/17 1024    Subjective  Pt noting no new complaints today with no pain. Pt states that she feels good. Has began taking a new medication for depression that she will bring next visit.     Pertinent History  CVA - Left PLICA infarct 8/36/6294; remote h/o prior CVAs (most recent in 1998); h/o B knee pain managed with injections    Patient Stated Goals  "walk better"    Currently in Pain?  No/denies                       [facility] Adult PT Treatment/Exercise - 09/20/17 0001      Exercises   Exercises  Lumbar;Knee/Hip      Lumbar Exercises: Seated   Hip Flexion on Ball  Both;10 reps    Hip Flexion  on Ball Limitations  Sitting on Green PB with CGA from therapist and VC to prevent sides of ball from resting on nearby walls. Pt demonstrates poor hip flexion ROM and compensates by attempting to use UE to lift the leg at the knee. PT provided VC to prevent compensation     Other Seated Lumbar Exercises  Sitting on Green PB; Yellow TB around knees; hip abduction. 2 x 5 reps in each direction. Pt did not demonstrate very far excursion and PT cued for bigger movements with larger ROM    Other Seated Lumbar Exercises  Sitting on Green PB; maintaining balance for 1 min; CGA from therapist and VC to ensure PB not resting on nearby walls      Knee/Hip Exercises: Aerobic   Nustep  L5 x 6 min (LE only; VC to keep step rate at 50)          Balance Exercises - 09/20/17 1202      Balance Exercises: Standing   Tandem Stance  Eyes open;Intermittent upper extremity support;1 rep    Other Standing  Exercises  Narrow BOS; pt able to achieved position independently and maintain position for ~ 1 min. Intermittent minor LOB but with pt able to recover balance independently      Balance Exercises: Standing   Tandem Stance Limitations  Pt required repeated VC to get one LE in front of the other; and continued to bear most of her weight on the posteriorly placed limb (R LE). She required CGA from therapist for intermittent minor LOB. She demonstrated considerable fatigue after holding position for ~ 1 min and required a break         PT Education - 09/20/17 1211    Education provided  Yes    Education Details  Instructed to bring new depression medication next visit to ensure updated Med list    Person(s) Educated  Patient    Methods  Explanation    Comprehension  Verbalized understanding       PT Short Term Goals - 09/02/17 1156      PT SHORT TERM GOAL #1   Title  Independent with initial HEP    Status  Achieved      PT SHORT TERM GOAL #2   Title  Increase gait speed to >/= 1.4 ft /sec to increase safety with limited community ambulation    Status  On-going    Target Date  09/30/17        PT Long Term Goals - 09/02/17 1149      PT LONG TERM GOAL #1   Title  Independent with ongoing/advanced HEP     Status  Achieved      PT LONG TERM GOAL #2   Title  Increase B LE strength to >/= 4/5 to 4/5 for increased ease of mobility    Status  On-going    Target Date  10/28/17      PT LONG TERM GOAL #3   Title  Pt will complete sit to/from stand transfers w/o UE assist demonstrating good control    Status  On-going    Target Date  10/28/17      PT LONG TERM GOAL #4   Title  Decrease TUG time to </= 25 sec to reduce risk for falls during transitional movements    Status  On-going    Target Date  10/28/17      PT LONG TERM GOAL #5   Title  Increase gait speed to >/= 1.8  ft/sec with LRAD to reduce risk for recurrent falls    Status  On-going    Target Date  10/28/17      PT  LONG TERM GOAL #6   Title  Increase Berg to >/= 37/56 to reduce fall risk    Status  Revised    Target Date  10/28/17            Plan - 09/20/17 1209    Clinical Impression Statement  During today's session Tara Dunn demonstrates considerable fatigue with all activities. She requires increased time to amb, especially so with SPC. VC utilized throughout session to increase pt pace between activities and during activities. She often compensates for LE weakeness by using UEs to lift up on her pant legs during activities, and therefore VC required to prevent this and improve LE strength. She will continue to benefit from physical therapy to continue to address her LE strength deficits, improve her core stabilization to improve speed of LE movements, as well as improve steadiness and speed of gait.     Rehab Potential  Good    PT Treatment/Interventions  Patient/family education;ADLs/Self Care Home Management;Therapeutic exercise;Therapeutic activities;Functional mobility training;Gait training;Balance training;Manual techniques;Passive range of motion;Dry needling;Taping;Cryotherapy;Moist Heat    PT Next Visit Plan  Balance activities to improve steadiness of gait     Consulted and Agree with Plan of Care  Patient       Patient will benefit from skilled therapeutic intervention in order to improve the following deficits and impairments:  Decreased strength, Decreased mobility, Decreased balance, Decreased activity tolerance, Difficulty walking, Abnormal gait, Decreased endurance, Decreased safety awareness, Decreased knowledge of precautions  Visit Diagnosis: Hemiplegia and hemiparesis following cerebral infarction affecting right dominant side (HCC)  Muscle weakness (generalized)  Other abnormalities of gait and mobility  Unsteadiness on feet  Difficulty in walking, not elsewhere classified     Problem List Patient Active Problem List   Diagnosis Date Noted  . Labile blood  pressure   . Hypoalbuminemia due to protein-calorie malnutrition ([place])   . Acute blood loss anemia   . AKI (acute kidney injury) ([place])   . Peripheral edema   . Stage 3 chronic kidney disease ([place])   . Benign essential HTN   . History of CVA with residual deficit   . Cerebrovascular accident (CVA) due to thrombosis of left middle cerebral artery ([place])   . Right leg weakness 04/19/2017  . Right hemiparesis ([place]) 04/19/2017  . 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 Joint pain 12/09/2015  . ICD (implantable cardioverter-defibrillator) in place 03/26/2015  . Syncope 03/06/2015  . Obesity (BMI 30-39.9) 01/21/2015  . Excessive daytime sleepiness 08/04/2014  . OSA (obstructive sleep apnea) 08/04/2014  . Essential hypertension 06/13/2014  . Chronic systolic CHF (congestive heart failure) ([place]) 05/20/2014  . Congestive heart disease ([place])   . Chronic combined systolic and diastolic CHF (congestive heart failure) ([place]) 05/03/2014  . CKD (chronic kidney disease), stage III ([place]) 05/03/2014  . Hx of stroke without residual deficits 05/03/2014    Shirline Frees, SPT  09/20/2017, 12:27 PM  [facility] [address]  [place], [place], [address] Phone: [phone]   Fax:  [phone]  Name: Tara Dunn MRN: [id number] Date of Birth: 08-22-1940

## 2017-09-21 DIAGNOSIS — M170 Bilateral primary osteoarthritis of knee: Secondary | ICD-10-CM | POA: Diagnosis not present

## 2017-09-21 NOTE — Progress Notes (Signed)
Patient ID: Tara Dunn, female   DOB: [birth year], 77 y.o.   MRN: [id number]     Advanced Heart Failure Clinic Note   PCP: Dr. Ernie Hew HF: Dr. Aundra Dubin   Tara Dunn is a 76 yo with long history of HTN, CVAs, CKD, and systolic CHF.    In 3/16, she was admitted a hypertensive emergency and acute CHF.  CTA chest showed no PE.  Echo showed EF 25-30% with moderate LVH.  She was diuresed and had a cardiac catheterization given extensive coronary calcification seen on CTA chest.  Cath showed diffuse moderate CAD, worst was 50-70% mRCA stenosis.   Admitted 04/19/2017 with RLE weakness. Had CVA. Later admitted to CIR. 04/22/2017- 05/11/2017. Entresto and lasix stopped. Discharged to her daughter house.   Today she returns for 2 week follow up for HF. Last visit lasix was increased from 40 mg BID to 80 mg daily to increase compliance (had been only taking lasix 40 mg daily for 1 week).  Overall doing much better. She is SOB after walking about 50 feet, sometimes with getting dressed. Denies edema, orthopnea, or PND. Denies bleeding on plavix and ASA. No CP or dizziness. She is going to outpatient PT and feels like she is getting stronger. She rarely weights and does not recall her weights at home. Doing better with limiting salt and fluid. No more pizza or cracker barrel. Followed by HF paramedicine. Sometimes forgets evening doses of medications.   Optivol: would not transmit today.   Labs (3/16): K 4.2, creatinine 1.44, HCT 39.7 Labs (06/05/14): K 4.1, creatinine 2.01 Labs (5/16): K 4.7, creatinine 1.56 Labs (7/16): K 4.7, creatinine 1.68, BNP 14, LDL 62 Labs (11/16): K 4.2, creatinine 1.93 Labs (1/17): K 4.6, creatinine 1.65, HCT 39.2 Labs (4/17): K 4.5, creatinine 1.98 Labs (6/17): K 4, creatinine 2 Labs (8/17): K 4.4, creatinine 1.64   Labs (10/17): K 3.9, creatinine 1.5, BNP 15 Labs (1/18): K 4.3, creatinine 1.59 Labs (4/18): LDL 46, HDL 57 Labs (5/18): K 4.6, creatinine 1.55 Labs (10/08/2016):  K 4 Creatinine 1.9  Labs (2/82019): K 4.2 Creatinine 2.24 Labs (05/09/2017): K 4.5 Creatinine 1.73, Hgb 9.9    PMH: 1. CVA: 3 prior CVAs, last in 1998.  Thought to be related to HTN. 2. HTN: Long-standing, says she has been hypertensive since age 69. Renal artery dopplers (4/16) were normal.  3. CKD: Suspect related to HTN. 4. Breast cancer: 2009, left mastectomy with chemotherapy.  5. Gout 6. Obese 7. TAH 8. CAD: LHC (3/16) with moderate diffuse disease, worst lesion being 50-70% mid RCA.  9. Cardiomyopathy: Echo (3/16) with EF 25-30%, moderate LVH.  Echo (6/16) with EF 35-40%, moderate LVH, diffuse hypokinesis with septal-lateral dyssynchrony.  Cardiac MRI (12/16) with EF 32%, diffuse hypokinesis, no contrast given due to CKD.  - Medtronic ICD 2/17.  - Echo (5/18): EF 40%, diffuse hypokinesis, moderate LVH, normal RV size and systolic function.  -ECHO 04/20/2017- EF 45-50% Grade IDD 10. OSA: Using CPAP.  11. Event monitor (1/17) with NSR, occasional PVCs.   SH: From [place], moved to [place] in 2015 to be near daughter.  Single.  Quit smoking 2009.    FH: HTN runs in family.  Father with MI.   Review of systems complete and found to be negative unless listed in HPI.   Current Outpatient Medications  Medication Sig Dispense Refill  . allopurinol (ZYLOPRIM) 100 MG tablet Take 2 tablets (200 mg total) by mouth daily. 30 tablet 0  . atorvastatin (LIPITOR)  80 MG tablet Take 1 tablet (80 mg total) by mouth daily at 6 PM. 30 tablet 3  . carvedilol (COREG) 25 MG tablet Take 1 tablet (25 mg total) by mouth 2 (two) times daily. 60 tablet 0  . carvedilol (COREG) 25 MG tablet TAKE 1 TABLET(25 MG) BY MOUTH TWICE DAILY 180 tablet 0  . clopidogrel (PLAVIX) 75 MG tablet Take 1 tablet (75 mg total) by mouth daily. 30 tablet 0  . DULoxetine (CYMBALTA) 30 MG capsule Take 30 mg by mouth daily.    . furosemide (LASIX) 20 MG tablet Take 4 tablets (80 mg total) by mouth daily. 120 tablet 3  .  isosorbide-hydrALAZINE (BIDIL) 20-37.5 MG tablet Take 2 tablets by mouth 2 (two) times daily.    Marland Kitchen omeprazole (PRILOSEC) 40 MG capsule Take 1 capsule (40 mg total) by mouth daily. 30 capsule 0  . potassium chloride (KLOR-CON) 20 MEQ packet Take 20 mEq by mouth daily. 30 packet 3  . senna-docusate (SENOKOT-S) 8.6-50 MG tablet Take 1 tablet by mouth at bedtime as needed for mild constipation.     No current facility-administered medications for this encounter.      BP 128/88   Pulse 76   Wt 88.9 kg (196 lb)   SpO2 97%   BMI 34.72 kg/m    Wt Readings from Last 3 Encounters:  09/22/17 88.9 kg (196 lb)  09/13/17 93 kg (205 lb 0.6 oz)  09/09/17 92.2 kg (203 lb 3.2 oz)   General: Elderly. No resp difficulty. Walked into clinic with walker HEENT: Normal Neck: Supple. No JVD. Carotids 2+ bilat; no bruits. No thyromegaly or nodule noted. Cor: PMI nondisplaced. RRR, No M/G/R noted Lungs: CTAB, normal effort. Abdomen: Soft, non-tender, non-distended, no HSM. No bruits or masses. +BS  Extremities: No cyanosis, clubbing, or rash. R and LLE no edema Neuro: Alert & orientedx3, cranial nerves grossly intact. moves all 4 extremities w/o difficulty. Affect pleasant   Assessment/Plan: 1. 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 S/p Medtronic ICD ECHO 04/2017 EF 45-50% . NYHA III- IIIB. Volume status stable on exam. Optivol would not trasmit today. Weight is down 7 lbs.  - Continue lasix 80 mg daily. BMET today - Continue carvedilol 25 mg twice a day.  - Continue bidil 2 tabs three times a day.  - Has not been on ARB/spiro with CKD - Followed by HF paramedicine. Doing much better with all of lasix in the morning.  2. HTN: Has history of CVA and CKD.  Renal artery dopplers were normal.   - Stable today.  3. CAD: Moderate diffuse CAD, no severe obstruction.  This did not cause her cardiomyopathy.  - No s/s ischemia - Continue ASA 81 - Continue atorvastatin 40 mg  daily, good lipids 3/19  4. CKD III:  Creatinine baseline 1.8-2.2. BMET today.  5. OSA: Not using CPAP. No change.  6. CVA: - Continue statin and plavix.   7. Deconditioning - Working with outpatient PT. Feels like she's getting stronger. Continue  BMET today Follow up 6-8 weeks  Georgiana Shore, NP 09/22/2017  Greater than 50% of the 25 minute visit was spent in counseling/coordination of care regarding disease state education, salt/fluid restriction, sliding scale diuretics, and medication compliance.

## 2017-09-22 ENCOUNTER — Ambulatory Visit (HOSPITAL_COMMUNITY)
Admission: RE | Admit: 2017-09-22 | Discharge: 2017-09-22 | Disposition: A | Discharge status: Home / Self Care | Payer: PPO | Source: Ambulatory Visit | Attending: Internal Medicine | Admitting: Internal Medicine

## 2017-09-22 ENCOUNTER — Encounter (HOSPITAL_COMMUNITY): Payer: Self-pay

## 2017-09-22 ENCOUNTER — Telehealth (HOSPITAL_COMMUNITY): Payer: Self-pay | Admitting: Cardiology

## 2017-09-22 ENCOUNTER — Other Ambulatory Visit (HOSPITAL_COMMUNITY): Payer: Self-pay

## 2017-09-22 VITALS — BP 128/88 | HR 76 | Wt 196.0 lb

## 2017-09-22 DIAGNOSIS — Z853 Personal history of malignant neoplasm of breast: Secondary | ICD-10-CM | POA: Insufficient documentation

## 2017-09-22 DIAGNOSIS — Z79899 Other long term (current) drug therapy: Secondary | ICD-10-CM | POA: Insufficient documentation

## 2017-09-22 DIAGNOSIS — Z8673 Personal history of transient ischemic attack (TIA), and cerebral infarction without residual deficits: Secondary | ICD-10-CM | POA: Insufficient documentation

## 2017-09-22 DIAGNOSIS — Z7902 Long term (current) use of antithrombotics/antiplatelets: Secondary | ICD-10-CM | POA: Diagnosis not present

## 2017-09-22 DIAGNOSIS — N183 Chronic kidney disease, stage 3 unspecified: Secondary | ICD-10-CM

## 2017-09-22 DIAGNOSIS — Z87891 Personal history of nicotine dependence: Secondary | ICD-10-CM | POA: Insufficient documentation

## 2017-09-22 DIAGNOSIS — I5022 Chronic systolic (congestive) heart failure: Secondary | ICD-10-CM | POA: Insufficient documentation

## 2017-09-22 DIAGNOSIS — I10 Essential (primary) hypertension: Secondary | ICD-10-CM | POA: Diagnosis not present

## 2017-09-22 DIAGNOSIS — M109 Gout, unspecified: Secondary | ICD-10-CM | POA: Diagnosis not present

## 2017-09-22 DIAGNOSIS — I2510 Atherosclerotic heart disease of native coronary artery without angina pectoris: Secondary | ICD-10-CM | POA: Insufficient documentation

## 2017-09-22 DIAGNOSIS — I130 Hypertensive heart and chronic kidney disease with heart failure and stage 1 through stage 4 chronic kidney disease, or unspecified chronic kidney disease: Secondary | ICD-10-CM | POA: Insufficient documentation

## 2017-09-22 DIAGNOSIS — Z9012 Acquired absence of left breast and nipple: Secondary | ICD-10-CM | POA: Diagnosis not present

## 2017-09-22 DIAGNOSIS — R5381 Other malaise: Secondary | ICD-10-CM | POA: Diagnosis not present

## 2017-09-22 DIAGNOSIS — E669 Obesity, unspecified: Secondary | ICD-10-CM | POA: Diagnosis not present

## 2017-09-22 DIAGNOSIS — I63312 Cerebral infarction due to thrombosis of left middle cerebral artery: Secondary | ICD-10-CM | POA: Diagnosis not present

## 2017-09-22 DIAGNOSIS — Z792 Long term (current) use of antibiotics: Secondary | ICD-10-CM | POA: Diagnosis not present

## 2017-09-22 DIAGNOSIS — G4733 Obstructive sleep apnea (adult) (pediatric): Secondary | ICD-10-CM | POA: Insufficient documentation

## 2017-09-22 DIAGNOSIS — I429 Cardiomyopathy, unspecified: Secondary | ICD-10-CM | POA: Diagnosis not present

## 2017-09-22 LAB — BASIC METABOLIC PANEL
ANION GAP: 10 (ref 5–15)
BUN: 41 mg/dL — ABNORMAL HIGH (ref 8–23)
CALCIUM: 9.8 mg/dL (ref 8.9–10.3)
CO2: 24 mmol/L (ref 22–32)
Chloride: 103 mmol/L (ref 98–111)
Creatinine, Ser: 1.63 mg/dL — ABNORMAL HIGH (ref 0.44–1.00)
GFR calc non Af Amer: 29 mL/min — ABNORMAL LOW (ref >60)
GFR, EST AFRICAN AMERICAN: 34 mL/min — AB (ref >60)
Glucose, Bld: 99 mg/dL (ref 70–99)
POTASSIUM: 5.1 mmol/L (ref 3.5–5.1)
Sodium: 137 mmol/L (ref 135–145)

## 2017-09-22 NOTE — Progress Notes (Signed)
Paramedicine Encounter   Patient ID: Tara Dunn , female,   DOB: 12-25-1940,76 y.o.,  MRN: [id number]   Met patient in clinic today with provider [place].   No med changes/ feeling good, looking into assistant living facilites per her daughter. Started antidepressant medications. Pt seem to be in good spirits Time spent with patient 10 mins   [place], [place] 09/22/2017   ACTION: Home visit completed

## 2017-09-22 NOTE — Telephone Encounter (Signed)
-----   Message from Georgiana Shore, NP sent at 09/22/2017 12:38 PM EDT ----- Renal function stable. Potassium has been on higher side for several months. She can stop taking 20 meq K daily. Thanks

## 2017-09-22 NOTE — Telephone Encounter (Signed)
Notes recorded by Kerry Dory, CMA on 09/22/2017 at 2:27 PM EDT Patient aware.  ------  Notes recorded by Georgiana Shore, NP on 09/22/2017 at 12:38 PM EDT Renal function stable. Potassium has been on higher side for several months. She can stop taking 20 meq K daily. Thanks

## 2017-09-22 NOTE — Patient Instructions (Signed)
Routine lab work today. Will notify you of abnormal results, otherwise no news is good news!  No changes to medication at this time.  Follow up 6-8 weeks.  ___________________________________________________________________________ Tara Dunn Code:  Take all medication as prescribed the day of your appointment. Bring all medications with you to your appointment.  Do the following things EVERYDAY: 1) Weigh yourself in the morning before breakfast. Write it down and keep it in a log. 2) Take your medicines as prescribed 3) Eat low salt foods-Limit salt (sodium) to 2000 mg per day.  4) Stay as active as you can everyday 5) Limit all fluids for the day to less than 2 liters

## 2017-09-23 ENCOUNTER — Ambulatory Visit: Payer: PPO | Admitting: Physical Therapy

## 2017-09-23 DIAGNOSIS — Z6837 Body mass index (BMI) 37.0-37.9, adult: Secondary | ICD-10-CM | POA: Diagnosis not present

## 2017-09-23 DIAGNOSIS — H547 Unspecified visual loss: Secondary | ICD-10-CM | POA: Diagnosis not present

## 2017-09-23 DIAGNOSIS — I509 Heart failure, unspecified: Secondary | ICD-10-CM | POA: Diagnosis not present

## 2017-09-23 DIAGNOSIS — I130 Hypertensive heart and chronic kidney disease with heart failure and stage 1 through stage 4 chronic kidney disease, or unspecified chronic kidney disease: Secondary | ICD-10-CM | POA: Diagnosis not present

## 2017-09-23 DIAGNOSIS — N181 Chronic kidney disease, stage 1: Secondary | ICD-10-CM | POA: Diagnosis not present

## 2017-09-23 DIAGNOSIS — E782 Mixed hyperlipidemia: Secondary | ICD-10-CM | POA: Diagnosis not present

## 2017-09-23 DIAGNOSIS — M170 Bilateral primary osteoarthritis of knee: Secondary | ICD-10-CM | POA: Diagnosis not present

## 2017-09-23 DIAGNOSIS — Z853 Personal history of malignant neoplasm of breast: Secondary | ICD-10-CM | POA: Diagnosis not present

## 2017-09-23 DIAGNOSIS — E669 Obesity, unspecified: Secondary | ICD-10-CM | POA: Diagnosis not present

## 2017-09-23 DIAGNOSIS — M109 Gout, unspecified: Secondary | ICD-10-CM | POA: Diagnosis not present

## 2017-09-23 DIAGNOSIS — F331 Major depressive disorder, recurrent, moderate: Secondary | ICD-10-CM | POA: Diagnosis not present

## 2017-09-23 DIAGNOSIS — G629 Polyneuropathy, unspecified: Secondary | ICD-10-CM | POA: Diagnosis not present

## 2017-09-23 DIAGNOSIS — Z7902 Long term (current) use of antithrombotics/antiplatelets: Secondary | ICD-10-CM | POA: Diagnosis not present

## 2017-09-23 DIAGNOSIS — K219 Gastro-esophageal reflux disease without esophagitis: Secondary | ICD-10-CM | POA: Diagnosis not present

## 2017-09-23 DIAGNOSIS — Z8673 Personal history of transient ischemic attack (TIA), and cerebral infarction without residual deficits: Secondary | ICD-10-CM | POA: Diagnosis not present

## 2017-09-27 ENCOUNTER — Ambulatory Visit: Payer: PPO

## 2017-09-27 DIAGNOSIS — R262 Difficulty in walking, not elsewhere classified: Secondary | ICD-10-CM

## 2017-09-27 DIAGNOSIS — R2689 Other abnormalities of gait and mobility: Secondary | ICD-10-CM

## 2017-09-27 DIAGNOSIS — I69351 Hemiplegia and hemiparesis following cerebral infarction affecting right dominant side: Secondary | ICD-10-CM

## 2017-09-27 DIAGNOSIS — M6281 Muscle weakness (generalized): Secondary | ICD-10-CM

## 2017-09-27 DIAGNOSIS — R2681 Unsteadiness on feet: Secondary | ICD-10-CM

## 2017-09-27 NOTE — Therapy (Signed)
[facility] [address]  [place], [place], [address] Phone: [phone]   Fax:  [phone]  Physical Therapy Treatment  Patient Details  Name: Tara Dunn MRN: [id number] Date of Birth: August 28, 1940 Referring Provider: Venancio Poisson, NP   Encounter Date: 09/27/2017  PT End of Session - 09/27/17 1026    Visit Number  19    Number of Visits  25    Date for PT Re-Evaluation  10/28/17    Authorization Type  [employer]    PT Start Time  1016    PT Stop Time  1101    PT Time Calculation (min)  45 min    Equipment Utilized During Treatment  Gait belt    Activity Tolerance  Patient tolerated treatment well;Patient limited by fatigue    Behavior During Therapy  [facility] for tasks assessed/performed       Past Medical History:  Diagnosis Date  . 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 Lovena Le  . Aortic dilatation ([place])    a. 04/2014 CTA chest w/ incidental finding of distal thoracic Ao enlargement of 3.39 mm - f/u needed 04/2015.  . Arthritis    "all over my body"  . Cancer of left breast ([place]) 2009   s/p L mastectomy and chemo.  . Cardiomyopathy ([place])    a. 04/2014 Echo: EF 25-30%, mod conc LVH, possibl antsept HK, Gr1 DD, mod-sev dil LA.  . CHF (congestive heart failure) ([place])   . CKD (chronic kidney disease), stage III ([place])   . Depression   . GERD (gastroesophageal reflux disease)   . Gout   . Heart murmur   . History of stomach ulcers   . Hypertension    a. Dx @ age 58;  b. 04/2014 admission for HTN emergency.  . Obesity (BMI 30-39.9) 01/21/2015  . OSA on CPAP   . Stroke [facility])    a. 3 strokes - last in 1998; "memory problems since"  (03/26/2015)    Past Surgical History:  Procedure Laterality Date  . ABDOMINAL HYSTERECTOMY    . BREAST BIOPSY Left 2008  . CATARACT EXTRACTION, BILATERAL Bilateral   . DILATION AND CURETTAGE OF UTERUS    . EP  IMPLANTABLE DEVICE N/A 03/26/2015   MDT single chamber ICD, Dr. Lovena Le  . LEFT HEART CATHETERIZATION WITH CORONARY ANGIOGRAM N/A 05/07/2014   Procedure: LEFT HEART CATHETERIZATION WITH CORONARY ANGIOGRAM;  Surgeon: Belva Crome, MD;  [facility] LAB;  Service: Cardiovascular;  Laterality: N/A;  . MASTECTOMY Left 2009    There were no vitals filed for this visit.  Subjective Assessment - 09/27/17 1036    Subjective  Pt. noting her leg muscles are sore today after doing exercise yesterday.      Pertinent History  CVA - Left PLICA infarct 6/64/4034; remote h/o prior CVAs (most recent in 1998); h/o B knee pain managed with injections    Patient Stated Goals  "walk better"    Currently in Pain?  No/denies    Multiple Pain Sites  No                       [facility] Adult PT Treatment/Exercise - 09/27/17 1041      Ambulation/Gait   Ambulation/Gait  Yes    Ambulation/Gait Assistance  5: Supervision    Ambulation/Gait Assistance Details  Gait training with [facility] working on increased step length with cueing provided for increased step  length, proper sequencing, and increased LE clearance     Ambulation Distance (Feet)  90 Feet    Assistive device  Straight cane    Gait Pattern  Decreased arm swing - right;Decreased arm swing - left;Lateral hip instability;Poor foot clearance - left;Poor foot clearance - right    Ambulation Surface  Level;Indoor      Knee/Hip Exercises: Aerobic   Nustep  L5 x 7 min (LE only)       Knee/Hip Exercises: Standing   Heel Raises  Both;20 reps    Heel Raises Limitations  Focusing on speed of movement for carryover with gait     Knee Flexion  Right;Left;15 reps;Strengthening    Knee Flexion Limitations  focusing on speed of movement to improve ambulation speed with cane    Hip Flexion  Right;Left;Stengthening;15 reps;Knee bent    Hip Flexion Limitations  with counter and cane     Hip Abduction  Right;Left;15 reps;Knee straight    Abduction  Limitations  Working on speed of movement for increased speed with gait     Other Standing Knee Exercises  B side stepping at counter working on "big steps" and quick movements x 2 laps at counter       Knee/Hip Exercises: Seated   Sit to [employer]  without UE support;2 sets;5 reps   from airex pad on chair and light 1 UE push              PT Short Term Goals - 09/02/17 1156      PT SHORT TERM GOAL #1   Title  Independent with initial HEP    Status  Achieved      PT SHORT TERM GOAL #2   Title  Increase gait speed to >/= 1.4 ft /sec to increase safety with limited community ambulation    Status  On-going    Target Date  09/30/17        PT Long Term Goals - 09/02/17 1149      PT LONG TERM GOAL #1   Title  Independent with ongoing/advanced HEP     Status  Achieved      PT LONG TERM GOAL #2   Title  Increase B LE strength to >/= 4/5 to 4/5 for increased ease of mobility    Status  On-going    Target Date  10/28/17      PT LONG TERM GOAL #3   Title  Pt will complete sit to/from stand transfers w/o UE assist demonstrating good control    Status  On-going    Target Date  10/28/17      PT LONG TERM GOAL #4   Title  Decrease TUG time to </= 25 sec to reduce risk for falls during transitional movements    Status  On-going    Target Date  10/28/17      PT LONG TERM GOAL #5   Title  Increase gait speed to >/= 1.8 ft/sec with LRAD to reduce risk for recurrent falls    Status  On-going    Target Date  10/28/17      PT LONG TERM GOAL #6   Title  Increase Berg to >/= 37/56 to reduce fall risk    Status  Revised    Target Date  10/28/17            Plan - 09/27/17 1026    Clinical Impression Statement  Pt. entering session today demonstrating improved stability in gait with SPC however still  with decreased LE clearance and pacing.  Today's session focused on exercises and exercise pacing to promote increased LE clearance and improve pt. safety with SPC.  Pt. tolerated  all activities in session well today however did require occasional sitting rest breaks due to ongoing fatigue with standing activities.  Will plan to address pt. goal status, as next visit is pt. 20th visit in [place].    PT Treatment/Interventions  Patient/family education;ADLs/Self Care Home Management;Therapeutic exercise;Therapeutic activities;Functional mobility training;Gait training;Balance training;Manual techniques;Passive range of motion;Dry needling;Taping;Cryotherapy;Moist Heat    PT Next Visit Plan  20th visit progress note     Consulted and Agree with Plan of Care  Patient       Patient will benefit from skilled therapeutic intervention in order to improve the following deficits and impairments:  Decreased strength, Decreased mobility, Decreased balance, Decreased activity tolerance, Difficulty walking, Abnormal gait, Decreased endurance, Decreased safety awareness, Decreased knowledge of precautions  Visit Diagnosis: Hemiplegia and hemiparesis following cerebral infarction affecting right dominant side (HCC)  Muscle weakness (generalized)  Other abnormalities of gait and mobility  Unsteadiness on feet  Difficulty in walking, not elsewhere classified     Problem List Patient Active Problem List   Diagnosis Date Noted  . Labile blood pressure   . Hypoalbuminemia due to protein-calorie malnutrition ([place])   . Acute blood loss anemia   . AKI (acute kidney injury) ([place])   . Peripheral edema   . Stage 3 chronic kidney disease ([place])   . Benign essential HTN   . History of CVA with residual deficit   . Cerebrovascular accident (CVA) due to thrombosis of left middle cerebral artery ([place])   . Right leg weakness 04/19/2017  . Right hemiparesis ([place]) 04/19/2017  . 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 Joint pain 12/09/2015  . ICD (implantable cardioverter-defibrillator) in place 03/26/2015  . Syncope 03/06/2015  . Obesity (BMI 30-39.9) 01/21/2015  . Excessive daytime  sleepiness 08/04/2014  . OSA (obstructive sleep apnea) 08/04/2014  . Essential hypertension 06/13/2014  . Chronic systolic CHF (congestive heart failure) ([place]) 05/20/2014  . Congestive heart disease ([place])   . Chronic combined systolic and diastolic CHF (congestive heart failure) ([place]) 05/03/2014  . CKD (chronic kidney disease), stage III ([place]) 05/03/2014  . Hx of stroke without residual deficits 05/03/2014    Bess Harvest, PTA 09/27/17 12:24 PM   [facility] [address]  [place], [place], [address] Phone: [phone]   Fax:  [phone]  Name: Jodilyn Giese MRN: [id number] Date of Birth: [birth year]

## 2017-09-28 ENCOUNTER — Other Ambulatory Visit: Payer: Self-pay | Admitting: Cardiology

## 2017-09-28 ENCOUNTER — Telehealth (HOSPITAL_COMMUNITY): Payer: Self-pay

## 2017-09-28 ENCOUNTER — Other Ambulatory Visit (HOSPITAL_COMMUNITY): Payer: Self-pay

## 2017-09-28 NOTE — Progress Notes (Signed)
Paramedicine Encounter    Patient ID: Tara Dunn, female    DOB: [birth year], 77 y.o.   MRN: [id number]    Patient Care Team: Fanny Bien, MD as PCP - General (Family Medicine)  Patient Active Problem List   Diagnosis Date Noted  . Labile blood pressure   . Hypoalbuminemia due to protein-calorie malnutrition ([place])   . Acute blood loss anemia   . AKI (acute kidney injury) ([place])   . Peripheral edema   . Stage 3 chronic kidney disease ([place])   . Benign essential HTN   . History of CVA with residual deficit   . Cerebrovascular accident (CVA) due to thrombosis of left middle cerebral artery ([place])   . Right leg weakness 04/19/2017  . Right hemiparesis ([place]) 04/19/2017  . 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 Joint pain 12/09/2015  . ICD (implantable cardioverter-defibrillator) in place 03/26/2015  . Syncope 03/06/2015  . Obesity (BMI 30-39.9) 01/21/2015  . Excessive daytime sleepiness 08/04/2014  . OSA (obstructive sleep apnea) 08/04/2014  . Essential hypertension 06/13/2014  . Chronic systolic CHF (congestive heart failure) ([place]) 05/20/2014  . Congestive heart disease ([place])   . Chronic combined systolic and diastolic CHF (congestive heart failure) ([place]) 05/03/2014  . CKD (chronic kidney disease), stage III ([place]) 05/03/2014  . Hx of stroke without residual deficits 05/03/2014    Current Outpatient Medications:  .  allopurinol (ZYLOPRIM) 100 MG tablet, Take 2 tablets (200 mg total) by mouth daily., Disp: 30 tablet, Rfl: 0 .  atorvastatin (LIPITOR) 80 MG tablet, Take 1 tablet (80 mg total) by mouth daily at 6 PM., Disp: 30 tablet, Rfl: 3 .  carvedilol (COREG) 25 MG tablet, Take 1 tablet (25 mg total) by mouth 2 (two) times daily., Disp: 60 tablet, Rfl: 0 .  clopidogrel (PLAVIX) 75 MG tablet, Take 1 tablet (75 mg total) by mouth daily., Disp: 30 tablet, Rfl: 0 .  furosemide (LASIX) 20 MG tablet, Take 4 tablets (80 mg total) by mouth daily., Disp: 120 tablet, Rfl:  3 .  isosorbide-hydrALAZINE (BIDIL) 20-37.5 MG tablet, Take 2 tablets by mouth 2 (two) times daily., Disp: , Rfl:  .  carvedilol (COREG) 25 MG tablet, TAKE 1 TABLET(25 MG) BY MOUTH TWICE DAILY, Disp: 180 tablet, Rfl: 0 .  DULoxetine (CYMBALTA) 30 MG capsule, Take 30 mg by mouth daily., Disp: , Rfl:  .  omeprazole (PRILOSEC) 40 MG capsule, Take 1 capsule (40 mg total) by mouth daily., Disp: 30 capsule, Rfl: 0 .  senna-docusate (SENOKOT-S) 8.6-50 MG tablet, Take 1 tablet by mouth at bedtime as needed for mild constipation., Disp: , Rfl:  Allergies  Allergen Reactions  . Shellfish Allergy Hives     Social History   Socioeconomic History  . Marital status: Widowed    Spouse name: Not on file  . Number of children: Not on file  . Years of education: Not on file  . Highest education level: Not on file  Occupational History  . Not on file  Social Needs  . Financial resource strain: Not on file  . Food insecurity:    Worry: Not on file    Inability: Not on file  . Transportation needs:    Medical: Not on file    Non-medical: Not on file  Tobacco Use  . Smoking status: Former Smoker    Packs/day: 0.50    Years: 40.00    Pack years: 20.00    Types: Cigarettes  . Smokeless tobacco: Never Used  . Tobacco  comment: Quit in 2009.  Substance and Sexual Activity  . Alcohol use: No    Alcohol/week: 0.0 standard drinks    Comment: "drank occasionally when I was young; last drink was in the late 1990s"  . Drug use: No  . Sexual activity: Never  Lifestyle  . Physical activity:    Days per week: Not on file    Minutes per session: Not on file  . Stress: Not on file  Relationships  . Social connections:    Talks on phone: Not on file    Gets together: Not on file    Attends religious service: Not on file    Active member of club or organization: Not on file    Attends meetings of clubs or organizations: Not on file    Relationship status: Not on file  . Intimate partner violence:     Fear of current or ex partner: Not on file    Emotionally abused: Not on file    Physically abused: Not on file    Forced sexual activity: Not on file  Other Topics Concern  . Not on file  Social History Narrative   Lives alone in an independent living facility.  Education: 10th grade.     Retired from [employer] work.  Moved here from [place] in 2015.    Physical Exam  Pulmonary/Chest: Effort normal. No respiratory distress.  Abdominal: Soft.  Musculoskeletal: She exhibits no edema.  Skin: Skin is warm and dry. She is not diaphoretic.        Future Appointments  Date Time Provider [place]  09/30/2017 10:15 AM Bess Harvest, PTA [facility]  10/04/2017 10:15 AM Percival Spanish, PT [facility]  10/11/2017 10:15 AM Bess Harvest, PTA [facility]  10/18/2017 10:15 AM Percival Spanish, PT [facility]  10/24/2017  2:45 PM Venancio Poisson, NP [facility] None  10/25/2017 10:15 AM Percival Spanish, PT [facility]  11/03/2017 11:00 AM [facility] PA/NP [facility] None     BP 130/78 (BP Location: Left Arm, Patient Position: Sitting, Cuff Size: Large)   Pulse 72   Resp 16   SpO2 98%   Weight yesterday- didn't weigh Last visit weight-196 (09/22/17 clinic appt)  ATF [name] sitting outside waiting.  She stated that her legs feel stiff and achy, chronic hx of same.  She advised me that she is no longer taking fluid pills because she think that she's lost too much weight.  Pt stated that will take furosemide PRN.  Pt denies sob, chest pain and dizziness.  Pt is still looking for assistant living facility.  rx bottles verified and pill box refilled.  193.8   I advised  That pt is no longer taking furosemide. Amy advised pt to take 40mg  furosemide instead of completely stopping all together.   Medication ordered: bidil :may have been picked up already Furosemide :may have been picked up already  [name], EMT Paramedic [phone] 09/28/2017    ACTION: Home visit  completed

## 2017-09-29 NOTE — Telephone Encounter (Signed)
Pt called to verify today's appointment.

## 2017-09-30 ENCOUNTER — Encounter: Payer: Self-pay | Admitting: Physical Therapy

## 2017-09-30 ENCOUNTER — Ambulatory Visit: Payer: PPO | Admitting: Physical Therapy

## 2017-09-30 DIAGNOSIS — R262 Difficulty in walking, not elsewhere classified: Secondary | ICD-10-CM

## 2017-09-30 DIAGNOSIS — R2681 Unsteadiness on feet: Secondary | ICD-10-CM

## 2017-09-30 DIAGNOSIS — I69351 Hemiplegia and hemiparesis following cerebral infarction affecting right dominant side: Secondary | ICD-10-CM | POA: Diagnosis not present

## 2017-09-30 DIAGNOSIS — R2689 Other abnormalities of gait and mobility: Secondary | ICD-10-CM

## 2017-09-30 DIAGNOSIS — M6281 Muscle weakness (generalized): Secondary | ICD-10-CM

## 2017-09-30 NOTE — Therapy (Signed)
[facility] [address]  [place], [place], [address] Phone: [phone]   Fax:  [phone]  Physical Therapy Treatment  Patient Details  Name: Tara Dunn MRN: [id number] Date of Birth: 27-Nov-1940 Referring Provider: Venancio Poisson, NP  Progress Note  Reporting Period 08/16/2017 to 09/30/2017  See note below for Objective Data and Assessment of Progress/Goals.     Encounter Date: 09/30/2017  PT End of Session - 09/30/17 1015    Visit Number  20    Number of Visits  25    Date for PT Re-Evaluation  10/28/17    Authorization Type  [employer]    PT Start Time  1015    PT Stop Time  1103    PT Time Calculation (min)  48 min    Equipment Utilized During Treatment  Gait belt    Activity Tolerance  Patient tolerated treatment well;Patient limited by fatigue    Behavior During Therapy  WFL for tasks assessed/performed       Past Medical History:  Diagnosis Date  . 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 Lovena Le  . Aortic dilatation ([place])    a. 04/2014 CTA chest w/ incidental finding of distal thoracic Ao enlargement of 3.39 mm - f/u needed 04/2015.  . Arthritis    "all over my body"  . Cancer of left breast ([place]) 2009   s/p L mastectomy and chemo.  . Cardiomyopathy ([place])    a. 04/2014 Echo: EF 25-30%, mod conc LVH, possibl antsept HK, Gr1 DD, mod-sev dil LA.  . CHF (congestive heart failure) ([place])   . CKD (chronic kidney disease), stage III ([place])   . Depression   . GERD (gastroesophageal reflux disease)   . Gout   . Heart murmur   . History of stomach ulcers   . Hypertension    a. Dx @ age 77;  b. 04/2014 admission for HTN emergency.  . Obesity (BMI 30-39.9) 01/21/2015  . OSA on CPAP   . Stroke [facility])    a. 3 strokes - last in 1998; "memory problems since"  (03/26/2015)    Past Surgical History:  Procedure Laterality Date  . ABDOMINAL  HYSTERECTOMY    . BREAST BIOPSY Left 2008  . CATARACT EXTRACTION, BILATERAL Bilateral   . DILATION AND CURETTAGE OF UTERUS    . EP IMPLANTABLE DEVICE N/A 03/26/2015   MDT single chamber ICD, Dr. Lovena Le  . LEFT HEART CATHETERIZATION WITH CORONARY ANGIOGRAM N/A 05/07/2014   Procedure: LEFT HEART CATHETERIZATION WITH CORONARY ANGIOGRAM;  Surgeon: Belva Crome, MD;  Location: [facility] CATH LAB;  Service: Cardiovascular;  Laterality: N/A;  . MASTECTOMY Left 2009    There were no vitals filed for this visit.  Subjective Assessment - 09/30/17 1015    Subjective  Keeps forgetting to bring info about her new med, but states it si for "stress".    Pertinent History  CVA - Left PLICA infarct 10/05/32; remote h/o prior CVAs (most recent in 1998); h/o B knee pain managed with injections    Patient Stated Goals  "walk better"    Currently in Pain?  No/denies         [facility] PT Assessment - 09/30/17 1015      Assessment   Medical Diagnosis  CVA - L PLICA - R LE weakness    Referring Provider  Venancio Poisson, NP    Onset Date/Surgical Date  04/18/17    Hand Dominance  Right    Next MD Visit  10/24/17      Strength   Right Hip Flexion  3-/5    Right Hip Extension  3/5    Right Hip ABduction  4/5    Right Hip ADduction  4-/5    Left Hip Flexion  3+/5    Left Hip Extension  4/5    Left Hip ABduction  4+/5    Left Hip ADduction  4/5    Right Knee Flexion  4-/5    Right Knee Extension  4/5    Left Knee Flexion  4+/5    Left Knee Extension  4+/5    Right Ankle Dorsiflexion  4/5    Left Ankle Dorsiflexion  4+/5      Ambulation/Gait   Gait velocity  1.31 ft/sec with RW; 1.10 ft/sec with SPC      Standardized Balance Assessment   10 Meter Walk  25.00" with RW; 29.94" with SPC      Berg Balance Test   Sit to Stand  Able to stand  independently using hands    Standing Unsupported  Able to stand safely 2 minutes    Sitting with Back Unsupported but Feet Supported on Floor or Stool  Able to sit  safely and securely 2 minutes    Stand to Sit  Controls descent by using hands    Transfers  Able to transfer safely, definite need of hands    Standing Unsupported with Eyes Closed  Able to stand 10 seconds with supervision    Standing Ubsupported with Feet Together  Able to place feet together independently and stand for 1 minute with supervision    From Standing, Reach Forward with Outstretched Arm  Can reach forward >12 cm safely (5")    From Standing Position, Pick up Object from Floor  Able to pick up shoe, needs supervision    From Standing Position, Turn to Look Behind Over each Shoulder  Looks behind one side only/other side shows less weight shift    Turn 360 Degrees  Able to turn 360 degrees safely but slowly    Standing Unsupported, Alternately Place Feet on Step/Stool  Needs assistance to keep from falling or unable to try    Standing Unsupported, One Foot in Front  Able to take small step independently and hold 30 seconds    Standing on One Leg  Tries to lift leg/unable to hold 3 seconds but remains standing independently    Total Score  37      Timed Up and Go Test   Normal TUG (seconds)  30   with RW; 31.16" with [facility]                  [facility] Adult PT Treatment/Exercise - 09/30/17 1015      Knee/Hip Exercises: Aerobic   Nustep  L5 x 6 min (LE only) - VC to keep step rate at ~60 steps/min               PT Short Term Goals - 09/30/17 1021      PT SHORT TERM GOAL #1   Title  Independent with initial HEP    Status  Achieved      PT SHORT TERM GOAL #2   Title  Increase gait speed to >/= 1.4 ft /sec to increase safety with limited community ambulation    Status  On-going   1.31 ft/sec with RW; 1.10 ft/sec with [facility]  PT Long Term Goals - 09/30/17 1021      PT LONG TERM GOAL #1   Title  Independent with ongoing/advanced HEP     Status  Partially Met      PT LONG TERM GOAL #2   Title  Increase B LE strength to >/= 4-/5 to 4/5 for increased  ease of mobility    Status  Partially Met      PT LONG TERM GOAL #3   Title  Pt will complete sit to/from stand transfers w/o UE assist demonstrating good control    Status  On-going      PT LONG TERM GOAL #4   Title  Decrease TUG time to </= 25 sec to reduce risk for falls during transitional movements    Status  On-going      PT LONG TERM GOAL #5   Title  Increase gait speed to >/= 1.8 ft/sec with LRAD to reduce risk for recurrent falls    Status  On-going      PT LONG TERM GOAL #6   Title  Increase Berg to >/= 37/56 to reduce fall risk    Status  Achieved            Plan - 09/30/17 1021    Clinical Impression Statement  Majorie continues to demonstrate slow but stready progress with PT. Gait speed and stability improving with both RW and SPC but gait speed remains just shy of STG at 1.31 ft/sec with RW and only 1.10 ft/sec with SPC. LE strength improving although significant proximal weakness persists R>L. She continues to require at least single UE assist to complete sit to stand transfers and continues to lack safety awareness with use of SPC during sit to stand transfers as well as proper alignment/positioning before attempting stand to to sit transfer which continues to contribute to her increased fall risk.Balance gradually imporving with decreased TUG time and increased Berg score by 7 point to 37/56 with revised LTG for Merrilee Jansky now met. Skylah will continue to benefit from skilled PT to address ongoing strength, balance, gait and safety awareness deficits to maximize functional independence with mobility and decreased risk for falls.    PT Treatment/Interventions  Patient/family education;ADLs/Self Care Home Management;Therapeutic exercise;Therapeutic activities;Functional mobility training;Gait training;Balance training;Manual techniques;Passive range of motion;Dry needling;Taping;Cryotherapy;Moist Heat    PT Next Visit Plan  20th visit progress note     Consulted and Agree  with Plan of Care  Patient       Patient will benefit from skilled therapeutic intervention in order to improve the following deficits and impairments:  Decreased strength, Decreased mobility, Decreased balance, Decreased activity tolerance, Difficulty walking, Abnormal gait, Decreased endurance, Decreased safety awareness, Decreased knowledge of precautions  Visit Diagnosis: Hemiplegia and hemiparesis following cerebral infarction affecting right dominant side (HCC)  Muscle weakness (generalized)  Other abnormalities of gait and mobility  Unsteadiness on feet  Difficulty in walking, not elsewhere classified     Problem List Patient Active Problem List   Diagnosis Date Noted  . Labile blood pressure   . Hypoalbuminemia due to protein-calorie malnutrition ([place])   . Acute blood loss anemia   . AKI (acute kidney injury) ([place])   . Peripheral edema   . Stage 3 chronic kidney disease ([place])   . Benign essential HTN   . History of CVA with residual deficit   . Cerebrovascular accident (CVA) due to thrombosis of left middle cerebral artery ([place])   . Right leg weakness 04/19/2017  .  Right hemiparesis ([place]) 04/19/2017  . 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 Joint pain 12/09/2015  . ICD (implantable cardioverter-defibrillator) in place 03/26/2015  . Syncope 03/06/2015  . Obesity (BMI 30-39.9) 01/21/2015  . Excessive daytime sleepiness 08/04/2014  . OSA (obstructive sleep apnea) 08/04/2014  . Essential hypertension 06/13/2014  . Chronic systolic CHF (congestive heart failure) ([place]) 05/20/2014  . Congestive heart disease ([place])   . Chronic combined systolic and diastolic CHF (congestive heart failure) ([place]) 05/03/2014  . CKD (chronic kidney disease), stage III ([place]) 05/03/2014  . Hx of stroke without residual deficits 05/03/2014    Percival Spanish, PT, MPT 09/30/2017, 12:22 PM  [facility] [address]  [place], [place], [address] Phone: [phone]   Fax:  [phone]  Name: Morenike Cuff MRN: [id number] Date of Birth: 1940/04/13

## 2017-10-04 ENCOUNTER — Encounter: Payer: Self-pay | Admitting: Physical Therapy

## 2017-10-04 ENCOUNTER — Ambulatory Visit: Payer: PPO | Admitting: Physical Therapy

## 2017-10-04 DIAGNOSIS — R2681 Unsteadiness on feet: Secondary | ICD-10-CM

## 2017-10-04 DIAGNOSIS — M6281 Muscle weakness (generalized): Secondary | ICD-10-CM

## 2017-10-04 DIAGNOSIS — I69351 Hemiplegia and hemiparesis following cerebral infarction affecting right dominant side: Secondary | ICD-10-CM | POA: Diagnosis not present

## 2017-10-04 DIAGNOSIS — R262 Difficulty in walking, not elsewhere classified: Secondary | ICD-10-CM

## 2017-10-04 DIAGNOSIS — R2689 Other abnormalities of gait and mobility: Secondary | ICD-10-CM

## 2017-10-04 NOTE — Therapy (Signed)
[facility] [address]  [place], [place], [address] Phone: [phone]   Fax:  [phone]  Physical Therapy Treatment  Patient Details  Name: Tara Dunn MRN: [id number] Date of Birth: 22-May-1940 Referring Provider: Venancio Poisson, NP   Encounter Date: 10/04/2017  PT End of Session - 10/04/17 1017    Visit Number  21    Number of Visits  25    Date for PT Re-Evaluation  10/28/17    Authorization Type  [employer]    PT Start Time  1017    PT Stop Time  1102    PT Time Calculation (min)  45 min    Equipment Utilized During Treatment  Gait belt    Activity Tolerance  Patient tolerated treatment well;Patient limited by fatigue    Behavior During Therapy  [facility] for tasks assessed/performed       Past Medical History:  Diagnosis Date  . 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 Lovena Le  . Aortic dilatation ([place])    a. 04/2014 CTA chest w/ incidental finding of distal thoracic Ao enlargement of 3.39 mm - f/u needed 04/2015.  . Arthritis    "all over my body"  . Cancer of left breast ([place]) 2009   s/p L mastectomy and chemo.  . Cardiomyopathy ([place])    a. 04/2014 Echo: EF 25-30%, mod conc LVH, possibl antsept HK, Gr1 DD, mod-sev dil LA.  . CHF (congestive heart failure) ([place])   . CKD (chronic kidney disease), stage III ([place])   . Depression   . GERD (gastroesophageal reflux disease)   . Gout   . Heart murmur   . History of stomach ulcers   . Hypertension    a. Dx @ age 68;  b. 04/2014 admission for HTN emergency.  . Obesity (BMI 30-39.9) 01/21/2015  . OSA on CPAP   . Stroke [facility])    a. 3 strokes - last in 1998; "memory problems since"  (03/26/2015)    Past Surgical History:  Procedure Laterality Date  . ABDOMINAL HYSTERECTOMY    . BREAST BIOPSY Left 2008  . CATARACT EXTRACTION, BILATERAL Bilateral   . DILATION AND CURETTAGE OF UTERUS    . EP  IMPLANTABLE DEVICE N/A 03/26/2015   MDT single chamber ICD, Dr. Lovena Le  . LEFT HEART CATHETERIZATION WITH CORONARY ANGIOGRAM N/A 05/07/2014   Procedure: LEFT HEART CATHETERIZATION WITH CORONARY ANGIOGRAM;  Surgeon: Belva Crome, MD;  Location: [facility] CATH LAB;  Service: Cardiovascular;  Laterality: N/A;  . MASTECTOMY Left 2009    There were no vitals filed for this visit.  Subjective Assessment - 10/04/17 1023    Subjective  Pt w/o new c/o or concerns today.    Pertinent History  CVA - Left PLICA infarct 9/83/3825; remote h/o prior CVAs (most recent in 1998); h/o B knee pain managed with injections    Patient Stated Goals  "walk better"    Currently in Pain?  No/denies    Pain Score  0-No pain                       [facility] Adult PT Treatment/Exercise - 10/04/17 1017      Exercises   Exercises  Knee/Hip      Knee/Hip Exercises: Aerobic   Nustep  L5 x 6 min (LE only) - VC to keep step rate at ~60 steps/min      Knee/Hip Exercises: Standing  Hip Flexion  Right;Left;15 reps;Stengthening    Hip Flexion Limitations  marching in RW, 2# on L - targeting knee to height of crossbar on RW      Knee/Hip Exercises: Seated   Long Arc Quad  Right;Left;15 reps;Strengthening;Weights    Long Arc Quad Weight  2 lbs.    Clamshell with TheraBand  Red   alt hip ABD/ER 15 x 3"   Other Seated Knee/Hip Exercises  R Fitter leg press (1 black) x10    Marching  Both;15 reps;Weights;Strengthening    Marching Weights  --   1# on R, 2# on L   Hamstring Curl  Right;Left;15 reps;Strengthening    Hamstring Limitations  red TB    Sit to Sand  5 reps;2 sets;without UE support   Airex pad, hands on knees; cues for foot position & fwd lean            PT Education - 10/04/17 1100    Education provided  Yes    Education Details  Red TB provided for HEP    Person(s) Educated  Patient    Methods  Explanation;Demonstration    Comprehension  Verbalized understanding;Returned demonstration        PT Short Term Goals - 09/30/17 1021      PT SHORT TERM GOAL #1   Title  Independent with initial HEP    Status  Achieved      PT SHORT TERM GOAL #2   Title  Increase gait speed to >/= 1.4 ft /sec to increase safety with limited community ambulation    Status  On-going   1.31 ft/sec with RW; 1.10 ft/sec with SPC       PT Long Term Goals - 09/30/17 1021      PT LONG TERM GOAL #1   Title  Independent with ongoing/advanced HEP     Status  Partially Met      PT LONG TERM GOAL #2   Title  Increase B LE strength to >/= 4-/5 to 4/5 for increased ease of mobility    Status  Partially Met      PT LONG TERM GOAL #3   Title  Pt will complete sit to/from stand transfers w/o UE assist demonstrating good control    Status  On-going      PT LONG TERM GOAL #4   Title  Decrease TUG time to </= 25 sec to reduce risk for falls during transitional movements    Status  On-going      PT LONG TERM GOAL #5   Title  Increase gait speed to >/= 1.8 ft/sec with LRAD to reduce risk for recurrent falls    Status  On-going      PT LONG TERM GOAL #6   Title  Increase Berg to >/= 37/56 to reduce fall risk    Status  Achieved            Plan - 10/04/17 1024    Clinical Impression Statement  Treatment focused on LE strengthening to increase ease of transitions and mobility including gait - frequent cues to maximize motion through full ROM. Reviewed safety with sit to stand transitions as well as techniques to optimize transfer with minimal to no need for UE assist - pt able to acheive sit to stand from elevated seat height w/o UE assist when feet positioned properly and pt utilizing adequate fwd weight shift. Several of HEP exercises reviewed and reinforced need for compliance with HEP using techniques and cues discussed  during therapy session.    PT Treatment/Interventions  Patient/family education;ADLs/Self Care Home Management;Therapeutic exercise;Therapeutic activities;Functional mobility  training;Gait training;Balance training;Manual techniques;Passive range of motion;Dry needling;Taping;Cryotherapy;Moist Heat    PT Next Visit Plan  20th visit progress note     Consulted and Agree with Plan of Care  Patient       Patient will benefit from skilled therapeutic intervention in order to improve the following deficits and impairments:  Decreased strength, Decreased mobility, Decreased balance, Decreased activity tolerance, Difficulty walking, Abnormal gait, Decreased endurance, Decreased safety awareness, Decreased knowledge of precautions  Visit Diagnosis: Hemiplegia and hemiparesis following cerebral infarction affecting right dominant side (HCC)  Muscle weakness (generalized)  Other abnormalities of gait and mobility  Unsteadiness on feet  Difficulty in walking, not elsewhere classified     Problem List Patient Active Problem List   Diagnosis Date Noted  . Labile blood pressure   . Hypoalbuminemia due to protein-calorie malnutrition ([place])   . Acute blood loss anemia   . AKI (acute kidney injury) ([place])   . Peripheral edema   . Stage 3 chronic kidney disease ([place])   . Benign essential HTN   . History of CVA with residual deficit   . Cerebrovascular accident (CVA) due to thrombosis of left middle cerebral artery ([place])   . Right leg weakness 04/19/2017  . Right hemiparesis ([place]) 04/19/2017  . 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 Joint pain 12/09/2015  . ICD (implantable cardioverter-defibrillator) in place 03/26/2015  . Syncope 03/06/2015  . Obesity (BMI 30-39.9) 01/21/2015  . Excessive daytime sleepiness 08/04/2014  . OSA (obstructive sleep apnea) 08/04/2014  . Essential hypertension 06/13/2014  . Chronic systolic CHF (congestive heart failure) ([place]) 05/20/2014  . Congestive heart disease ([place])   . Chronic combined systolic and diastolic CHF (congestive heart failure) ([place]) 05/03/2014  . CKD (chronic kidney disease), stage III ([place]) 05/03/2014  . Hx  of stroke without residual deficits 05/03/2014    Percival Spanish, PT, MPT 10/04/2017, 12:07 PM  [facility] [address]  [place], [place], [address] Phone: [phone]   Fax:  [phone]  Name: Tara Dunn MRN: [id number] Date of Birth: 06/17/1940

## 2017-10-05 ENCOUNTER — Other Ambulatory Visit (HOSPITAL_COMMUNITY): Payer: Self-pay

## 2017-10-05 ENCOUNTER — Encounter (HOSPITAL_COMMUNITY): Payer: Self-pay

## 2017-10-05 ENCOUNTER — Telehealth (HOSPITAL_COMMUNITY): Payer: Self-pay

## 2017-10-05 DIAGNOSIS — M170 Bilateral primary osteoarthritis of knee: Secondary | ICD-10-CM | POA: Diagnosis not present

## 2017-10-05 NOTE — Progress Notes (Signed)
Paramedicine Encounter    Patient ID: Tara Dunn, female    DOB: Feb 20, 1940, 77 y.o.   MRN: [id number]    Patient Care Team: Fanny Bien, MD as PCP - General (Family Medicine)  Patient Active Problem List   Diagnosis Date Noted  . Labile blood pressure   . Hypoalbuminemia due to protein-calorie malnutrition ([place])   . Acute blood loss anemia   . AKI (acute kidney injury) ([place])   . Peripheral edema   . Stage 3 chronic kidney disease ([place])   . Benign essential HTN   . History of CVA with residual deficit   . Cerebrovascular accident (CVA) due to thrombosis of left middle cerebral artery ([place])   . Right leg weakness 04/19/2017  . Right hemiparesis ([employer]) 04/19/2017  . 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 Joint pain 12/09/2015  . ICD (implantable cardioverter-defibrillator) in place 03/26/2015  . Syncope 03/06/2015  . Obesity (BMI 30-39.9) 01/21/2015  . Excessive daytime sleepiness 08/04/2014  . OSA (obstructive sleep apnea) 08/04/2014  . Essential hypertension 06/13/2014  . Chronic systolic CHF (congestive heart failure) ([place]) 05/20/2014  . Congestive heart disease ([place])   . Chronic combined systolic and diastolic CHF (congestive heart failure) ([place]) 05/03/2014  . CKD (chronic kidney disease), stage III ([place]) 05/03/2014  . Hx of stroke without residual deficits 05/03/2014    Current Outpatient Medications:  .  allopurinol (ZYLOPRIM) 100 MG tablet, Take 2 tablets (200 mg total) by mouth daily., Disp: 30 tablet, Rfl: 0 .  atorvastatin (LIPITOR) 80 MG tablet, Take 1 tablet (80 mg total) by mouth daily at 6 PM., Disp: 30 tablet, Rfl: 3 .  carvedilol (COREG) 25 MG tablet, Take 1 tablet (25 mg total) by mouth 2 (two) times daily., Disp: 60 tablet, Rfl: 0 .  clopidogrel (PLAVIX) 75 MG tablet, Take 1 tablet (75 mg total) by mouth daily., Disp: 30 tablet, Rfl: 0 .  furosemide (LASIX) 20 MG tablet, Take 4 tablets (80 mg total) by mouth daily., Disp: 120 tablet, Rfl:  3 .  isosorbide-hydrALAZINE (BIDIL) 20-37.5 MG tablet, Take 2 tablets by mouth 2 (two) times daily., Disp: , Rfl:  .  omeprazole (PRILOSEC) 40 MG capsule, Take 1 capsule (40 mg total) by mouth daily., Disp: 30 capsule, Rfl: 0 .  carvedilol (COREG) 25 MG tablet, TAKE 1 TABLET(25 MG) BY MOUTH TWICE DAILY, Disp: 180 tablet, Rfl: 0 .  DULoxetine (CYMBALTA) 30 MG capsule, Take 30 mg by mouth daily., Disp: , Rfl:  .  senna-docusate (SENOKOT-S) 8.6-50 MG tablet, Take 1 tablet by mouth at bedtime as needed for mild constipation., Disp: , Rfl:  Allergies  Allergen Reactions  . Shellfish Allergy Hives     Social History   Socioeconomic History  . Marital status: Widowed    Spouse name: Not on file  . Number of children: Not on file  . Years of education: Not on file  . Highest education level: Not on file  Occupational History  . Not on file  Social Needs  . Financial resource strain: Not on file  . Food insecurity:    Worry: Not on file    Inability: Not on file  . Transportation needs:    Medical: Not on file    Non-medical: Not on file  Tobacco Use  . Smoking status: Former Smoker    Packs/day: 0.50    Years: 40.00    Pack years: 20.00    Types: Cigarettes  . Smokeless tobacco: Never Used  . Tobacco  comment: Quit in 2009.  Substance and Sexual Activity  . Alcohol use: No    Alcohol/week: 0.0 standard drinks    Comment: "drank occasionally when I was young; last drink was in the late 1990s"  . Drug use: No  . Sexual activity: Never  Lifestyle  . Physical activity:    Days per week: Not on file    Minutes per session: Not on file  . Stress: Not on file  Relationships  . Social connections:    Talks on phone: Not on file    Gets together: Not on file    Attends religious service: Not on file    Active member of club or organization: Not on file    Attends meetings of clubs or organizations: Not on file    Relationship status: Not on file  . Intimate partner violence:     Fear of current or ex partner: Not on file    Emotionally abused: Not on file    Physically abused: Not on file    Forced sexual activity: Not on file  Other Topics Concern  . Not on file  Social History Narrative   Lives alone in an independent living facility.  Education: 10th grade.     Retired from [employer] work.  Moved here from [place] in 2015.    Physical Exam  Pulmonary/Chest: Effort normal. No respiratory distress.  Musculoskeletal: She exhibits no edema.  Skin: Skin is warm and dry. She is not diaphoretic.        Future Appointments  Date Time Provider [place]  10/11/2017 10:15 AM Bess Harvest, PTA [facility]  10/18/2017 10:15 AM Percival Spanish, PT [facility]  10/24/2017  2:45 PM Venancio Poisson, NP [facility] None  10/25/2017 10:15 AM Percival Spanish, PT [facility]  11/03/2017 11:00 AM [facility] PA/NP [facility] None     BP 130/80 (BP Location: Right Arm, Patient Position: Sitting, Cuff Size: Large)   Pulse 78   Resp 16   Wt 195 lb (88.5 kg)   SpO2 95%   BMI 34.54 kg/m     ATF [name] x4 sitting at the kitchen table with pill boxes.  She stated that she hasn't taken any of the furosemide in the past 1.5 weeks.  She stated that she was loosing too much weight.  I advised her that Amy said for her to take at least half a dose instead of stopping completely.  We also discussed possible reasons why she's loosing weight (PT 5 days a week, so shes more active and her eating habits has changed).  Pt agreed to take at least 20 mg furosemide.  Pt denies sob, chest pain and dizziness.  rx bottles verified and pill boxes refilled (2). Pt also agrees to take the cymbalta.  Medication ordered: Carvedilol ---her daughter will call in next week because its too early [place], EMT Paramedic [phone] 10/05/2017    ACTION: Home visit completed

## 2017-10-05 NOTE — Telephone Encounter (Signed)
Confirmed our 330 appointment for today.

## 2017-10-07 ENCOUNTER — Other Ambulatory Visit: Payer: Self-pay | Admitting: Cardiology

## 2017-10-11 ENCOUNTER — Ambulatory Visit: Payer: PPO | Attending: Adult Health

## 2017-10-11 DIAGNOSIS — R2689 Other abnormalities of gait and mobility: Secondary | ICD-10-CM | POA: Diagnosis not present

## 2017-10-11 DIAGNOSIS — R262 Difficulty in walking, not elsewhere classified: Secondary | ICD-10-CM | POA: Insufficient documentation

## 2017-10-11 DIAGNOSIS — G4733 Obstructive sleep apnea (adult) (pediatric): Secondary | ICD-10-CM | POA: Diagnosis not present

## 2017-10-11 DIAGNOSIS — I69351 Hemiplegia and hemiparesis following cerebral infarction affecting right dominant side: Secondary | ICD-10-CM | POA: Diagnosis not present

## 2017-10-11 DIAGNOSIS — I5022 Chronic systolic (congestive) heart failure: Secondary | ICD-10-CM | POA: Diagnosis not present

## 2017-10-11 DIAGNOSIS — M6281 Muscle weakness (generalized): Secondary | ICD-10-CM | POA: Diagnosis not present

## 2017-10-11 DIAGNOSIS — R2681 Unsteadiness on feet: Secondary | ICD-10-CM

## 2017-10-11 DIAGNOSIS — G8191 Hemiplegia, unspecified affecting right dominant side: Secondary | ICD-10-CM | POA: Diagnosis not present

## 2017-10-11 NOTE — Therapy (Signed)
[facility] [address]  [place], [place], [address] Phone: [phone]   Fax:  [phone]  Physical Therapy Treatment  Patient Details  Name: Tara Dunn MRN: [id number] Date of Birth: September 20, 1940 Referring Provider: Venancio Poisson, NP   Encounter Date: 10/11/2017  PT End of Session - 10/11/17 1021    Visit Number  22    Number of Visits  25    Date for PT Re-Evaluation  10/28/17    Authorization Type  [employer]    PT Start Time  1014    PT Stop Time  1055    PT Time Calculation (min)  41 min    Equipment Utilized During Treatment  --    Activity Tolerance  Patient tolerated treatment well;Patient limited by fatigue    Behavior During Therapy  [facility] for tasks assessed/performed       Past Medical History:  Diagnosis Date  . 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 Lovena Le  . Aortic dilatation ([place])    a. 04/2014 CTA chest w/ incidental finding of distal thoracic Ao enlargement of 3.39 mm - f/u needed 04/2015.  . Arthritis    "all over my body"  . Cancer of left breast ([place]) 2009   s/p L mastectomy and chemo.  . Cardiomyopathy ([place])    a. 04/2014 Echo: EF 25-30%, mod conc LVH, possibl antsept HK, Gr1 DD, mod-sev dil LA.  . CHF (congestive heart failure) ([place])   . CKD (chronic kidney disease), stage III ([place])   . Depression   . GERD (gastroesophageal reflux disease)   . Gout   . Heart murmur   . History of stomach ulcers   . Hypertension    a. Dx @ age 51;  b. 04/2014 admission for HTN emergency.  . Obesity (BMI 30-39.9) 01/21/2015  . OSA on CPAP   . Stroke [facility])    a. 3 strokes - last in 1998; "memory problems since"  (03/26/2015)    Past Surgical History:  Procedure Laterality Date  . ABDOMINAL HYSTERECTOMY    . BREAST BIOPSY Left 2008  . CATARACT EXTRACTION, BILATERAL Bilateral   . DILATION AND CURETTAGE OF UTERUS    . EP IMPLANTABLE  DEVICE N/A 03/26/2015   MDT single chamber ICD, Dr. Lovena Le  . LEFT HEART CATHETERIZATION WITH CORONARY ANGIOGRAM N/A 05/07/2014   Procedure: LEFT HEART CATHETERIZATION WITH CORONARY ANGIOGRAM;  Surgeon: Belva Crome, MD;  Location: [facility] CATH LAB;  Service: Cardiovascular;  Laterality: N/A;  . MASTECTOMY Left 2009    There were no vitals filed for this visit.  Subjective Assessment - 10/11/17 1021    Subjective  Pt. noting she has been performing HEP.  Doing well today with no new complaints.      Pertinent History  CVA - Left PLICA infarct 6/96/2952; remote h/o prior CVAs (most recent in 1998); h/o B knee pain managed with injections    Patient Stated Goals  "walk better"    Currently in Pain?  No/denies    Pain Score  0-No pain    Multiple Pain Sites  No                       [facility] Adult PT Treatment/Exercise - 10/11/17 1023      Knee/Hip Exercises: Aerobic   Nustep  L5 x 7 min (LE/UE) - VC to keep step rate at ~70 steps/min  Knee/Hip Exercises: Standing   Hip Flexion  Right;Left;10 reps    Hip Flexion Limitations  2#      Knee/Hip Exercises: Seated   Long Arc Quad  Right;15 reps    Long Arc Quad Weight  2 lbs.    Long [employer] Limitations  adduction ball squeeze     Other Seated Knee/Hip Exercises  R Fitter leg press (1 black) x 15    Hamstring Curl  Right;Left;15 reps;Strengthening   green TB for L LE   Hamstring Limitations  red TB    Sit to Sand  10 reps;1 set;with UE support   from airex pad and 1 UE pushoff from table 1 HH assist     Knee/Hip Exercises: Supine   Bridges with Ball Squeeze  10 reps;Strengthening               PT Short Term Goals - 09/30/17 1021      PT SHORT TERM GOAL #1   Title  Independent with initial HEP    Status  Achieved      PT SHORT TERM GOAL #2   Title  Increase gait speed to >/= 1.4 ft /sec to increase safety with limited community ambulation    Status  On-going   1.31 ft/sec with RW; 1.10 ft/sec with SPC        PT Long Term Goals - 09/30/17 1021      PT LONG TERM GOAL #1   Title  Independent with ongoing/advanced HEP     Status  Partially Met      PT LONG TERM GOAL #2   Title  Increase B LE strength to >/= 4-/5 to 4/5 for increased ease of mobility    Status  Partially Met      PT LONG TERM GOAL #3   Title  Pt will complete sit to/from stand transfers w/o UE assist demonstrating good control    Status  On-going      PT LONG TERM GOAL #4   Title  Decrease TUG time to </= 25 sec to reduce risk for falls during transitional movements    Status  On-going      PT LONG TERM GOAL #5   Title  Increase gait speed to >/= 1.8 ft/sec with LRAD to reduce risk for recurrent falls    Status  On-going      PT LONG TERM GOAL #6   Title  Increase Berg to >/= 37/56 to reduce fall risk    Status  Achieved            Plan - 10/11/17 1022    Clinical Impression Statement  Tara Dunn doing well today with no new complaints.  Session focusing on LE strengthening activities with cueing to promote increased pace with activities for carryover with gait and transfers as pt. continues to demo tendency for slow movement pattern.  Ended visit with pt. verbalizing LE fatigue however pain free.  Strongly encouraged pt. to continue daily performance of HEP with pt. verbalizing understanding.      PT Treatment/Interventions  Patient/family education;ADLs/Self Care Home Management;Therapeutic exercise;Therapeutic activities;Functional mobility training;Gait training;Balance training;Manual techniques;Passive range of motion;Dry needling;Taping;Cryotherapy;Moist Heat    Consulted and Agree with Plan of Care  Patient       Patient will benefit from skilled therapeutic intervention in order to improve the following deficits and impairments:  Decreased strength, Decreased mobility, Decreased balance, Decreased activity tolerance, Difficulty walking, Abnormal gait, Decreased endurance, Decreased safety awareness,  Decreased knowledge  of precautions  Visit Diagnosis: Hemiplegia and hemiparesis following cerebral infarction affecting right dominant side (HCC)  Muscle weakness (generalized)  Other abnormalities of gait and mobility  Unsteadiness on feet  Difficulty in walking, not elsewhere classified     Problem List Patient Active Problem List   Diagnosis Date Noted  . Labile blood pressure   . Hypoalbuminemia due to protein-calorie malnutrition ([place])   . Acute blood loss anemia   . AKI (acute kidney injury) ([place])   . Peripheral edema   . Stage 3 chronic kidney disease ([place])   . Benign essential HTN   . History of CVA with residual deficit   . Cerebrovascular accident (CVA) due to thrombosis of left middle cerebral artery ([place])   . Right leg weakness 04/19/2017  . Right hemiparesis ([place]) 04/19/2017  . 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 Joint pain 12/09/2015  . ICD (implantable cardioverter-defibrillator) in place 03/26/2015  . Syncope 03/06/2015  . Obesity (BMI 30-39.9) 01/21/2015  . Excessive daytime sleepiness 08/04/2014  . OSA (obstructive sleep apnea) 08/04/2014  . Essential hypertension 06/13/2014  . Chronic systolic CHF (congestive heart failure) ([place]) 05/20/2014  . Congestive heart disease ([place])   . Chronic combined systolic and diastolic CHF (congestive heart failure) ([place]) 05/03/2014  . CKD (chronic kidney disease), stage III ([place]) 05/03/2014  . Hx of stroke without residual deficits 05/03/2014    Bess Harvest, PTA 10/11/17 11:21 AM   [facility] [address]  [place], [place], [address] Phone: [phone]   Fax:  [phone]  Name: Tara Dunn MRN: [id number] Date of Birth: Dec 01, 1940

## 2017-10-12 DIAGNOSIS — F331 Major depressive disorder, recurrent, moderate: Secondary | ICD-10-CM | POA: Diagnosis not present

## 2017-10-12 DIAGNOSIS — Z23 Encounter for immunization: Secondary | ICD-10-CM | POA: Diagnosis not present

## 2017-10-12 DIAGNOSIS — Z6832 Body mass index (BMI) 32.0-32.9, adult: Secondary | ICD-10-CM | POA: Diagnosis not present

## 2017-10-12 DIAGNOSIS — M170 Bilateral primary osteoarthritis of knee: Secondary | ICD-10-CM | POA: Diagnosis not present

## 2017-10-18 ENCOUNTER — Encounter: Payer: Self-pay | Admitting: Physical Therapy

## 2017-10-18 ENCOUNTER — Ambulatory Visit: Payer: PPO | Admitting: Physical Therapy

## 2017-10-18 DIAGNOSIS — R2689 Other abnormalities of gait and mobility: Secondary | ICD-10-CM

## 2017-10-18 DIAGNOSIS — R262 Difficulty in walking, not elsewhere classified: Secondary | ICD-10-CM

## 2017-10-18 DIAGNOSIS — I69351 Hemiplegia and hemiparesis following cerebral infarction affecting right dominant side: Secondary | ICD-10-CM

## 2017-10-18 DIAGNOSIS — R2681 Unsteadiness on feet: Secondary | ICD-10-CM

## 2017-10-18 DIAGNOSIS — M6281 Muscle weakness (generalized): Secondary | ICD-10-CM

## 2017-10-18 NOTE — Therapy (Addendum)
[facility] [address]  [place], [place], [address] Phone: [phone]   Fax:  [phone]  Physical Therapy Treatment  Patient Details  Name: Tara Dunn MRN: [id number] Date of Birth: 02-Nov-1940 Referring Provider: Venancio Poisson, NP   Encounter Date: 10/18/2017  PT End of Session - 10/18/17 1023    Visit Number  23    Number of Visits  25    Date for PT Re-Evaluation  10/28/17    Authorization Type  [employer]    PT Start Time  1023    PT Stop Time  1108    PT Time Calculation (min)  45 min    Activity Tolerance  Patient tolerated treatment well;Patient limited by fatigue    Behavior During Therapy  [facility] for tasks assessed/performed       Past Medical History:  Diagnosis Date  . 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 Lovena Le  . Aortic dilatation ([place])    a. 04/2014 CTA chest w/ incidental finding of distal thoracic Ao enlargement of 3.39 mm - f/u needed 04/2015.  . Arthritis    "all over my body"  . Cancer of left breast ([place]) 2009   s/p L mastectomy and chemo.  . Cardiomyopathy ([place])    a. 04/2014 Echo: EF 25-30%, mod conc LVH, possibl antsept HK, Gr1 DD, mod-sev dil LA.  . CHF (congestive heart failure) ([place])   . CKD (chronic kidney disease), stage III ([place])   . Depression   . GERD (gastroesophageal reflux disease)   . Gout   . Heart murmur   . History of stomach ulcers   . Hypertension    a. Dx @ age 81;  b. 04/2014 admission for HTN emergency.  . Obesity (BMI 30-39.9) 01/21/2015  . OSA on CPAP   . Stroke [facility])    a. 3 strokes - last in 1998; "memory problems since"  (03/26/2015)    Past Surgical History:  Procedure Laterality Date  . ABDOMINAL HYSTERECTOMY    . BREAST BIOPSY Left 2008  . CATARACT EXTRACTION, BILATERAL Bilateral   . DILATION AND CURETTAGE OF UTERUS    . EP IMPLANTABLE DEVICE N/A 03/26/2015   MDT single chamber  ICD, Dr. Lovena Le  . LEFT HEART CATHETERIZATION WITH CORONARY ANGIOGRAM N/A 05/07/2014   Procedure: LEFT HEART CATHETERIZATION WITH CORONARY ANGIOGRAM;  Surgeon: Belva Crome, MD;  Location: [facility] CATH LAB;  Service: Cardiovascular;  Laterality: N/A;  . MASTECTOMY Left 2009    There were no vitals filed for this visit.   Subjective Assessment - 10/18/17 1027    Subjective  Pt reporting her L leg started bothering her the day before yesterday w/o known MOI.    Pertinent History  CVA - Left PLICA infarct 6/94/5038; remote h/o prior CVAs (most recent in 1998); h/o B knee pain managed with injections    Patient Stated Goals  "walk better"    Currently in Pain?  Yes    Pain Score  7     Pain Location  Leg    Pain Orientation  Left    Pain Descriptors / Indicators  Dull    Pain Type  Acute pain    Pain Onset  In the past 7 days    Pain Frequency  Intermittent         [facility] PT Assessment - 10/18/17 1023      Assessment   Medical Diagnosis  CVA - L PLICA -  R LE weakness    Referring Provider  Venancio Poisson, NP    Onset Date/Surgical Date  04/18/17    Hand Dominance  Right    Next MD Visit  10/24/17      Strength   Overall Strength Comments  tested in sitting    Right Hip Flexion  3/5    Right Hip Extension  3+/5    Right Hip ABduction  4+/5    Right Hip ADduction  4/5    Left Hip Flexion  3+/5    Left Hip Extension  4/5    Left Hip ABduction  4+/5    Left Hip ADduction  4/5    Right Knee Flexion  4/5    Right Knee Extension  4/5    Left Knee Flexion  4+/5    Left Knee Extension  4+/5    Right Ankle Dorsiflexion  4+/5    Left Ankle Dorsiflexion  4+/5      Ambulation/Gait   Ambulation/Gait Assistance  6: Modified independent (Device/Increase time);5: Supervision    Ambulation/Gait Assistance Details  mod I with RW; supervision with [facility]    Assistive device  Rolling walker;Straight cane    Gait Pattern  Step-through pattern;Decreased stride length;Decreased arm swing -  right;Decreased arm swing - left   decreased cadence   Ambulation Surface  Level;Indoor    Gait velocity  1.28 ft/sec with RW; 0.85 ft/sec with SPC      Standardized Balance Assessment   10 Meter Walk  25.62" with RW; 38.37" with SPC      Berg Balance Test   Sit to Stand  Able to stand  independently using hands    Standing Unsupported  Able to stand safely 2 minutes    Sitting with Back Unsupported but Feet Supported on Floor or Stool  Able to sit safely and securely 2 minutes    Stand to Sit  Controls descent by using hands    Transfers  Able to transfer safely, definite need of hands    Standing Unsupported with Eyes Closed  Able to stand 10 seconds with supervision    Standing Ubsupported with Feet Together  Able to place feet together independently and stand for 1 minute with supervision    From Standing, Reach Forward with Outstretched Arm  Can reach forward >12 cm safely (5")    From Standing Position, Pick up Object from Floor  Able to pick up shoe, needs supervision    From Standing Position, Turn to Look Behind Over each Shoulder  Looks behind one side only/other side shows less weight shift    Turn 360 Degrees  Able to turn 360 degrees safely but slowly    Standing Unsupported, Alternately Place Feet on Step/Stool  Needs assistance to keep from falling or unable to try    Standing Unsupported, One Foot in Front  Able to plae foot ahead of the other independently and hold 30 seconds    Standing on One Leg  Tries to lift leg/unable to hold 3 seconds but remains standing independently    Total Score  38    Berg comment:  37-45 significant fall risk (>80%)      Timed Up and Go Test   Normal TUG (seconds)  36.91   with RW; 49.65" with SPC               Objective measurements completed on examination: See above findings.      [facility] Adult PT Treatment/Exercise - 10/18/17 1023  Exercises   Exercises  Knee/Hip      Knee/Hip Exercises: Aerobic   Recumbent Bike   L1 x 5 min               PT Short Term Goals - 10/18/17 1041      PT SHORT TERM GOAL #1   Title  Independent with initial HEP    Status  Achieved      PT SHORT TERM GOAL #2   Title  Increase gait speed to >/= 1.4 ft /sec to increase safety with limited community ambulation    Status  Not Met        PT Long Term Goals - 10/18/17 1041      PT LONG TERM GOAL #1   Title  Independent with ongoing/advanced HEP     Status  Partially Met      PT LONG TERM GOAL #2   Title  Increase B LE strength to >/= 4-/5 to 4/5 for increased ease of mobility    Status  Partially Met      PT LONG TERM GOAL #3   Title  Pt will complete sit to/from stand transfers w/o UE assist demonstrating good control    Status  Not Met      PT LONG TERM GOAL #4   Title  Decrease TUG time to </= 25 sec to reduce risk for falls during transitional movements    Status  Not Met      PT LONG TERM GOAL #5   Title  Increase gait speed to >/= 1.8 ft/sec with LRAD to reduce risk for recurrent falls    Status  Not Met      PT LONG TERM GOAL #6   Title  Increase Berg to >/= 37/56 to reduce fall risk    Status  Achieved             Plan - 10/18/17 1030    Clinical Impression Statement  Majorie has demonstrated good progress with PT but appears to be reaching a plateau with progress at this point. B LE strength has improved overall with R LE MMT now more equivalent to L LE, however continued proximal weakness exists bilaterally. Gait speed and TUG times improved since start of care but have demonstrated recent leveling off and in some cases slight decline noted.  Gait pattern with RW has improved with increased stride length and foot clearance, however gait speed of 1.28 ft/sec less than typical of community ambulators and continues to indicate a risk for recurrent falls. Berg balance goal met with current score of 38/56 no longer indicating a high fall risk, however significant fall risk (>80%) indicating  need for continued full time use of RW. Discussed recent plateauing of progress with patient and daughter, and will plan for transition to HEP afer review/update of HEP on remaining visit in existing POC.    Rehab Potential  Good    PT Treatment/Interventions  Patient/family education;ADLs/Self Care Home Management;Therapeutic exercise;Therapeutic activities;Functional mobility training;Gait training;Balance training;Manual techniques;Passive range of motion;Dry needling;Taping;Cryotherapy;Moist Heat    Consulted and Agree with Plan of Care  Patient       Patient will benefit from skilled therapeutic intervention in order to improve the following deficits and impairments:  Decreased strength, Decreased mobility, Decreased balance, Decreased activity tolerance, Difficulty walking, Abnormal gait, Decreased endurance, Decreased safety awareness, Decreased knowledge of precautions  Visit Diagnosis: Hemiplegia and hemiparesis following cerebral infarction affecting right dominant side (HCC)  Muscle weakness (generalized)  Other abnormalities of gait and mobility  Unsteadiness on feet  Difficulty in walking, not elsewhere classified     Problem List Patient Active Problem List   Diagnosis Date Noted  . Labile blood pressure   . Hypoalbuminemia due to protein-calorie malnutrition ([place])   . Acute blood loss anemia   . AKI (acute kidney injury) ([place])   . Peripheral edema   . Stage 3 chronic kidney disease ([place])   . Benign essential HTN   . History of CVA with residual deficit   . Cerebrovascular accident (CVA) due to thrombosis of left middle cerebral artery ([place])   . Right leg weakness 04/19/2017  . Right hemiparesis ([place]) 04/19/2017  . 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 Joint pain 12/09/2015  . ICD (implantable cardioverter-defibrillator) in place 03/26/2015  . Syncope 03/06/2015  . Obesity (BMI 30-39.9) 01/21/2015  . Excessive daytime sleepiness 08/04/2014  . OSA  (obstructive sleep apnea) 08/04/2014  . Essential hypertension 06/13/2014  . Chronic systolic CHF (congestive heart failure) ([place]) 05/20/2014  . Congestive heart disease ([place])   . Chronic combined systolic and diastolic CHF (congestive heart failure) ([place]) 05/03/2014  . CKD (chronic kidney disease), stage III ([place]) 05/03/2014  . Hx of stroke without residual deficits 05/03/2014    Percival Spanish, PT, MPT 10/18/2017, 11:57 AM  [facility] [address]  [place], [place], [address] Phone: [phone]   Fax:  [phone]  Name: Talayia Hjort MRN: [id number] Date of Birth: 08-08-1940

## 2017-10-20 ENCOUNTER — Other Ambulatory Visit (HOSPITAL_COMMUNITY): Payer: Self-pay

## 2017-10-20 ENCOUNTER — Encounter (HOSPITAL_COMMUNITY): Payer: Self-pay

## 2017-10-20 NOTE — Progress Notes (Signed)
Paramedicine Encounter    Patient ID: Tara Dunn, female    DOB: [birth year], 77 y.o.   MRN: [id number]    Patient Care Team: Fanny Bien, MD as PCP - General (Family Medicine)  Patient Active Problem List   Diagnosis Date Noted  . Labile blood pressure   . Hypoalbuminemia due to protein-calorie malnutrition ([place])   . Acute blood loss anemia   . AKI (acute kidney injury) ([place])   . Peripheral edema   . Stage 3 chronic kidney disease ([place])   . Benign essential HTN   . History of CVA with residual deficit   . Cerebrovascular accident (CVA) due to thrombosis of left middle cerebral artery ([place])   . Right leg weakness 04/19/2017  . Right hemiparesis ([place]) 04/19/2017  . 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 Joint pain 12/09/2015  . ICD (implantable cardioverter-defibrillator) in place 03/26/2015  . Syncope 03/06/2015  . Obesity (BMI 30-39.9) 01/21/2015  . Excessive daytime sleepiness 08/04/2014  . OSA (obstructive sleep apnea) 08/04/2014  . Essential hypertension 06/13/2014  . Chronic systolic CHF (congestive heart failure) ([place]) 05/20/2014  . Congestive heart disease ([place])   . Chronic combined systolic and diastolic CHF (congestive heart failure) ([place]) 05/03/2014  . CKD (chronic kidney disease), stage III ([place]) 05/03/2014  . Hx of stroke without residual deficits 05/03/2014    Current Outpatient Medications:  .  allopurinol (ZYLOPRIM) 100 MG tablet, Take 2 tablets (200 mg total) by mouth daily., Disp: 30 tablet, Rfl: 0 .  atorvastatin (LIPITOR) 80 MG tablet, Take 1 tablet (80 mg total) by mouth daily at 6 PM., Disp: 30 tablet, Rfl: 3 .  carvedilol (COREG) 25 MG tablet, Take 1 tablet (25 mg total) by mouth 2 (two) times daily., Disp: 60 tablet, Rfl: 0 .  clopidogrel (PLAVIX) 75 MG tablet, Take 1 tablet (75 mg total) by mouth daily., Disp: 30 tablet, Rfl: 0 .  DULoxetine (CYMBALTA) 30 MG capsule, Take 30 mg by mouth daily., Disp: , Rfl:  .  furosemide (LASIX) 20  MG tablet, Take 4 tablets (80 mg total) by mouth daily., Disp: 120 tablet, Rfl: 3 .  isosorbide-hydrALAZINE (BIDIL) 20-37.5 MG tablet, Take 2 tablets by mouth 2 (two) times daily., Disp: , Rfl:  .  omeprazole (PRILOSEC) 40 MG capsule, Take 1 capsule (40 mg total) by mouth daily., Disp: 30 capsule, Rfl: 0 .  carvedilol (COREG) 25 MG tablet, TAKE 1 TABLET(25 MG) BY MOUTH TWICE DAILY, Disp: 180 tablet, Rfl: 3 .  senna-docusate (SENOKOT-S) 8.6-50 MG tablet, Take 1 tablet by mouth at bedtime as needed for mild constipation., Disp: , Rfl:  Allergies  Allergen Reactions  . Shellfish Allergy Hives     Social History   Socioeconomic History  . Marital status: Widowed    Spouse name: Not on file  . Number of children: Not on file  . Years of education: Not on file  . Highest education level: Not on file  Occupational History  . Not on file  Social Needs  . Financial resource strain: Not on file  . Food insecurity:    Worry: Not on file    Inability: Not on file  . Transportation needs:    Medical: Not on file    Non-medical: Not on file  Tobacco Use  . Smoking status: Former Smoker    Packs/day: 0.50    Years: 40.00    Pack years: 20.00    Types: Cigarettes  . Smokeless tobacco: Never Used  . Tobacco  comment: Quit in 2009.  Substance and Sexual Activity  . Alcohol use: No    Alcohol/week: 0.0 standard drinks    Comment: "drank occasionally when I was young; last drink was in the late 1990s"  . Drug use: No  . Sexual activity: Never  Lifestyle  . Physical activity:    Days per week: Not on file    Minutes per session: Not on file  . Stress: Not on file  Relationships  . Social connections:    Talks on phone: Not on file    Gets together: Not on file    Attends religious service: Not on file    Active member of club or organization: Not on file    Attends meetings of clubs or organizations: Not on file    Relationship status: Not on file  . Intimate partner violence:     Fear of current or ex partner: Not on file    Emotionally abused: Not on file    Physically abused: Not on file    Forced sexual activity: Not on file  Other Topics Concern  . Not on file  Social History Narrative   Lives alone in an independent living facility.  Education: 10th grade.     Retired from [employer] work.  Moved here from [place] in 2015.    Physical Exam  Pulmonary/Chest: Effort normal. No respiratory distress. She has no wheezes. She has no rales.  Musculoskeletal: She exhibits edema.  +2 in ankles  Skin: She is not diaphoretic.        Future Appointments  Date Time Provider [place]  10/24/2017  2:45 PM Venancio Poisson, NP [facility] None  10/25/2017 10:15 AM Percival Spanish, PT [facility]  11/03/2017 11:00 AM [facility] PA/NP [facility] None     BP 118/74 (BP Location: Right Arm, Patient Position: Sitting, Cuff Size: Large)   Pulse 78   Resp 16   Wt 194 lb 3.2 oz (88.1 kg)   SpO2 98%   BMI 34.40 kg/m    ATF [name] x4 pt just came in from running errands.  Had a headache one day and took tylenol which realived the pain.  She still only taking 40 mg lasix when she thinks that she need it.  Pt has been taking only 40mg  or no lasix now for several weeks.  Today she has edema noted to her ankles and I advised her of the same.  Pt was advised to take at least 40mg  (per Amy about a month ago) instead of not taking any at all.  She agrees to take the lasix every day for the next two weeks.  rx bottles verified and pill box refilled (2).   Medication ordered: Atorvastatin bidil  * I asked her daughter to call in the rx since it's too early to call in the rx today.    Sanjith Siwek, EMT Paramedic [phone] 10/20/2017    ACTION: Home visit completed

## 2017-10-20 NOTE — Progress Notes (Signed)
Thank you for the update! -Jess 

## 2017-10-24 ENCOUNTER — Encounter: Payer: Self-pay | Admitting: Adult Health

## 2017-10-24 ENCOUNTER — Ambulatory Visit (INDEPENDENT_AMBULATORY_CARE_PROVIDER_SITE_OTHER): Payer: PPO | Admitting: Adult Health

## 2017-10-24 VITALS — BP 148/88 | HR 71 | Ht 63.0 in | Wt 194.4 lb

## 2017-10-24 DIAGNOSIS — I63532 Cerebral infarction due to unspecified occlusion or stenosis of left posterior cerebral artery: Secondary | ICD-10-CM

## 2017-10-24 DIAGNOSIS — I10 Essential (primary) hypertension: Secondary | ICD-10-CM | POA: Diagnosis not present

## 2017-10-24 DIAGNOSIS — E785 Hyperlipidemia, unspecified: Secondary | ICD-10-CM

## 2017-10-24 DIAGNOSIS — G4733 Obstructive sleep apnea (adult) (pediatric): Secondary | ICD-10-CM | POA: Diagnosis not present

## 2017-10-24 NOTE — Patient Instructions (Signed)
Continue clopidogrel 75 mg daily  and lipitor 80mg   for secondary stroke prevention  Continue to follow up with PCP regarding cholesterol and blood pressure management   Important to be compliant with CPAP machine. Ensure this gets cleaned and start using it again. Follow up with Dr. Radford Pax in March 2020 as recommended for follow up   Continue to follow up with cardiology as scheduled  Continue to stay active - once therapy is completed, ensure you continue to do home exercises   Continue to monitor blood pressure at home  Maintain strict control of hypertension with blood pressure goal below 130/90, diabetes with hemoglobin A1c goal below 6.5% and cholesterol with LDL cholesterol (bad cholesterol) goal below 70 mg/dL. I also advised the patient to eat a healthy diet with plenty of whole grains, cereals, fruits and vegetables, exercise regularly and maintain ideal body weight.  Followup in the future with me in 6 months or call earlier if needed       Thank you for coming to see [place] at [facility]. I hope we have been able to provide you high quality care today.  You may receive a patient satisfaction survey over the next few weeks. We would appreciate your feedback and comments so that we may continue to improve ourselves and the health of our patients.

## 2017-10-24 NOTE — Progress Notes (Signed)
[facility] [address] [place]. [place] [address] [phone]       OFFICE FOLLOW UP NOTE  [name] Date of Birth:  1941-01-20 Medical Record Number:  [id number]   Reason for Referral: stroke follow up  CHIEF COMPLAINT:  Chief Complaint  Patient presents with  . Follow-up    4 month follow up. Patiient is accomplanied by her neighbor. Patient mentioned there are no new changes or concerns at this time.     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 History obtained from patient and chart review. Reviewed all radiology images and labs personally.  [name] is a 77 y.o. female with history of breast cancer, cardiomyopathy status post ICD, systolic heart failure, CKD, pior CVA,hypertension with progressive RLE weakness presenting with worsening of the RLE weakness. She did not receive IV t-PA.  CT head reviewed and showed no acute abnormality.  MRI brain reviewed and showed acute/subacute left PLICA infarct.  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  Carotid Doppler showed bilateral ICA stenosis of 1 to 39%.  2D echo showed EF of 45 to 50% without source of embolus.  LDL and A1c satisfactory at 42 and 6.1.  Patient was previously on Lipitor 40 mg and recommended to be discharged on Lipitor 80 mg.  Patient was previously on aspirin 81 mg and recommended to be discharged on DAPT of aspirin and Plavix for 3 weeks and then continue Plavix alone.  Patient does have a history of sleep apnea and is compliant with his CPAP at home.  Patient does have a [employer] AICD which was interrogated that did not show atrial fibrillation or abnormalities.  Recommended for patient to be discharged to CIR for continuation of therapies.   06/21/2017 visit: Patient returns today for follow-up and is currently accompanied by her daughter and granddaughter.  She  continues to have right lower extremity weakness that is not fully back to her baseline (residual right lower extremity weakness from previous stroke).  She did complete home physical therapy due to insurance coverage.  She currently is living with her daughter and uses a rolling walker for ambulation.  She was living independently prior to the stroke but now is unable to do IADLs and most ADLs.  Continues to take Plavix without side effects of bleeding or bruising.  Continues to take Lipitor without side effects of myalgias.  Blood pressure today satisfactory at 137/60.  Patient does have a history of OSA and wears CPAP mask 2-3 nights of the week.  When questioned why she does not wear it nightly she states that she does not feel like putting it on.  Explained to patient that treating OSA can lower heart disease and stroke risk which patient stated she was unaware of and is in agreement to start being compliant with nightly wearing of CPAP mask.  Denies new or worsening strokes/TIA symptoms.  Interval history 10/24/2017: Patient is being seen today for scheduled follow-up visit and is accompanied by her neighbor.  She continues to have some right hemiparesis but has been improving.  She has been receiving home PT 3 days weekly and outpatient PT 2 days weekly.  She believes these will be completed after this week.  She continues to use rolling walker for ambulation.  Continues to take Plavix without side effects of bleeding or bruising.  Continues to take Lipitor without  side effects myalgias.  Blood pressure today 148/88.  Patient states she has been unable to use CPAP as it needs to be cleaned.  She admits noncompliance for the past 1 month.  She does state that she has a cleaning device by her grandson needs to do this for her.  Neighbor states that she will ensure he does this for her today.  Patient is followed by Dr. Radford Pax for management of her OSA and CPAP.  Denies new or worsening stroke/TIA  symptoms.   ROS:   14 system review of systems performed and negative with exception of cough, wheezing, shortness of breath, loss of vision, leg swelling, walking difficulty  PMH:  Past Medical History:  Diagnosis Date  . 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 Lovena Le  . Aortic dilatation ([place])    a. 04/2014 CTA chest w/ incidental finding of distal thoracic Ao enlargement of 3.39 mm - f/u needed 04/2015.  . Arthritis    "all over my body"  . Cancer of left breast ([place]) 2009   s/p L mastectomy and chemo.  . Cardiomyopathy ([place])    a. 04/2014 Echo: EF 25-30%, mod conc LVH, possibl antsept HK, Gr1 DD, mod-sev dil LA.  . CHF (congestive heart failure) ([place])   . CKD (chronic kidney disease), stage III ([place])   . Depression   . GERD (gastroesophageal reflux disease)   . Gout   . Heart murmur   . History of stomach ulcers   . Hypertension    a. Dx @ age 35;  b. 04/2014 admission for HTN emergency.  . Obesity (BMI 30-39.9) 01/21/2015  . OSA on CPAP   . Stroke [facility])    a. 3 strokes - last in 1998; "memory problems since"  (03/26/2015)    PSH:  Past Surgical History:  Procedure Laterality Date  . ABDOMINAL HYSTERECTOMY    . BREAST BIOPSY Left 2008  . CATARACT EXTRACTION, BILATERAL Bilateral   . DILATION AND CURETTAGE OF UTERUS    . EP IMPLANTABLE DEVICE N/A 03/26/2015   MDT single chamber ICD, Dr. Lovena Le  . LEFT HEART CATHETERIZATION WITH CORONARY ANGIOGRAM N/A 05/07/2014   Procedure: LEFT HEART CATHETERIZATION WITH CORONARY ANGIOGRAM;  Surgeon: Belva Crome, MD;  Location: [facility] CATH LAB;  Service: Cardiovascular;  Laterality: N/A;  . MASTECTOMY Left 2009    Social History:  Social History   Socioeconomic History  . Marital status: Widowed    Spouse name: Not on file  . Number of children: Not on file  . Years of education: Not on file  . Highest education level: Not on file  Occupational History  . Not on file  Social Needs  . Financial  resource strain: Not on file  . Food insecurity:    Worry: Not on file    Inability: Not on file  . Transportation needs:    Medical: Not on file    Non-medical: Not on file  Tobacco Use  . Smoking status: Former Smoker    Packs/day: 0.50    Years: 40.00    Pack years: 20.00    Types: Cigarettes  . Smokeless tobacco: Never Used  . Tobacco comment: Quit in 2009.  Substance and Sexual Activity  . Alcohol use: No    Alcohol/week: 0.0 standard drinks    Comment: "drank occasionally when I was young; last drink was in the late 1990s"  . Drug use: No  . Sexual activity: Never  Lifestyle  . Physical activity:  Days per week: Not on file    Minutes per session: Not on file  . Stress: Not on file  Relationships  . Social connections:    Talks on phone: Not on file    Gets together: Not on file    Attends religious service: Not on file    Active member of club or organization: Not on file    Attends meetings of clubs or organizations: Not on file    Relationship status: Not on file  . Intimate partner violence:    Fear of current or ex partner: Not on file    Emotionally abused: Not on file    Physically abused: Not on file    Forced sexual activity: Not on file  Other Topics Concern  . Not on file  Social History Narrative   Lives alone in an independent living facility.  Education: 10th grade.     Retired from [employer] work.  Moved here from [place] in 2015.    Family History:  Family History  Problem Relation Age of Onset  . High blood pressure Mother        Died @ 66.  . High blood pressure Father   . Heart attack Father        Died in his early 21's.  . High blood pressure Sister   . Cancer Sister   . Cancer Daughter   . Heart disease Daughter     Medications:   Current Outpatient Medications on File Prior to Visit  Medication Sig Dispense Refill  . allopurinol (ZYLOPRIM) 100 MG tablet Take 2 tablets (200 mg total) by mouth daily. 30 tablet 0  . atorvastatin  (LIPITOR) 40 MG tablet Take 40 mg by mouth daily.    . Calcium Carbonate Antacid (ALKA-SELTZER ANTACID PO) Take by mouth as needed.    . carvedilol (COREG) 25 MG tablet TAKE 1 TABLET(25 MG) BY MOUTH TWICE DAILY 180 tablet 3  . clopidogrel (PLAVIX) 75 MG tablet Take 1 tablet (75 mg total) by mouth daily. 30 tablet 0  . colchicine 0.6 MG tablet Take 0.6 mg by mouth daily as needed (Gout flare up).    . DULoxetine (CYMBALTA) 30 MG capsule Take 30 mg by mouth daily.    . furosemide (LASIX) 20 MG tablet Take 4 tablets (80 mg total) by mouth daily. 120 tablet 3  . isosorbide-hydrALAZINE (BIDIL) 20-37.5 MG tablet Take 2 tablets by mouth 2 (two) times daily.    Marland Kitchen omeprazole (PRILOSEC) 20 MG capsule Take 20 mg by mouth daily. Can also take two times daily as needed    . potassium chloride SA (K-DUR,KLOR-CON) 20 MEQ tablet Take 20 mEq by mouth daily.     No current facility-administered medications on file prior to visit.     Allergies:   Allergies  Allergen Reactions  . Shellfish Allergy Hives     Physical Exam  Vitals:   10/24/17 1439  BP: (!) 148/88  Pulse: 71  Weight: 194 lb 6.4 oz (88.2 kg)  Height: 5\' 3"  (1.6 m)   Body mass index is 34.44 kg/m. No exam data present  General: well developed, elderly African-American female, well nourished, seated, in no evident distress Head: head normocephalic and atraumatic.   Neck: supple with no carotid or supraclavicular bruits Cardiovascular: regular rate and rhythm, no murmurs; +2 pitting edema bilateral lower extremities Musculoskeletal: no deformity Skin:  no rash/petichiae Vascular:  Normal pulses all extremities  Neurologic Exam Mental Status: Awake and fully alert. Oriented to  place and time. Recent and remote memory intact. Attention span, concentration and fund of knowledge appropriate. Mood and affect appropriate.  Cranial Nerves: Fundoscopic exam reveals sharp disc margins. Pupils equal, briskly reactive to light. Extraocular  movements full without nystagmus. Visual fields full to confrontation. Hearing intact. Facial sensation intact. Face, tongue, palate moves normally and symmetrically.  Motor: Normal bulk and tone. Normal strength in all tested extremity muscles.  4/5 right lower extremity weakness greater in hip flexor; mild left lower extremity weakness in hip flexor Sensory.: intact to touch , pinprick , position and vibratory sensation.  Coordination: Rapid alternating movements normal in all extremities. Finger-to-nose and heel-to-shin performed accurately bilaterally. Gait and Station: Arises from chair with moderate difficulty. Stance is normal. Gait demonstrates slow cautious steps with assistance of rolling walker.  Tandem gait not tested. Reflexes: 1+ and symmetric. Toes downgoing.     Diagnostic Data (Labs, Imaging, Testing)  CT HEAD WO CONTRAST 04/18/2017 IMPRESSION: Chronic atrophic and ischemic changes without acute abnormality.  MR BRAIN WO CONTRAST 04/19/2017 IMPRESSION: 1. Acute to early subacute lacunar infarct in the posterior limb of left internal capsule. No associated hemorrhage or mass effect. 2. Severe underlying chronic small vessel disease. Chronic lacunar infarcts in the bilateral deep gray matter nuclei, brainstem, cerebellum, and cerebral white matter. 3. 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 4. Intracranial atherosclerosis. Hemodynamically significant stenosis is suspected only in the distal left vertebral artery.  MR MRA HEAD WO CONTRAST 04/19/2017 IMPRESSION: 1. Acute to early subacute lacunar infarct in the posterior limb of left internal capsule. No associated hemorrhage or mass effect. 2. Severe underlying chronic small vessel disease. Chronic lacunar infarcts in the bilateral deep gray matter nuclei, brainstem, cerebellum, and cerebral white matter. 3. 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 4.  Intracranial atherosclerosis. Hemodynamically significant stenosis is suspected only in the distal left vertebral artery.  ECHOCARDIOGRAM COMPLETE 04/20/2017 Impressions: - Comapared to a prior study in 06/2016, the LVEF is higher at   45-50% with global hypokinesis, grade 1 DD and elevated LV   filling pressure.  VAS US CAROTID DUPLEX BILATERAL 04/20/2017 Final Interpretation: Right Carotid: Velocities in the right ICA are consistent with a 1-39% stenosis. Left Carotid: Velocities in the left ICA are consistent with a 1-39% stenosis. Vertebrals: Bilateral vertebral arteries demonstrate antegrade flow.    ASSESSMENT: Amberlee Inglis is a 77 y.o. year old female here with left posterior limb of internal capsule infarct on 04/18/2017 secondary to small vessel disease. Vascular risk factors include HLD, OSA and HTN.  Patient is being seen today for follow-up visit and continues to have right lower extremity weakness but otherwise stable from stroke standpoint.  PLAN: -Continue clopidogrel 75 mg daily  and Lipitor for secondary stroke prevention -F/u with PCP regarding your HLD and HTN management -f/u with Dr. Radford Pax for OSA and CPAP management as scheduled -continue to monitor BP at home -Repeat MRA head for routine monitoring of aneurysm at follow-up appointment -Educated patient again regarding importance of CPAP compliance for both health reasons and insurance purposes.  Patient verbalizes understanding -Once therapy is completed, educated patient on importance of continuing home exercises and activity in order to continue to gain improvement in lower extremity weakness and to prevent deconditioning -Maintain strict control of hypertension with blood pressure goal below 130/90, diabetes with hemoglobin A1c goal below 6.5% and cholesterol with LDL cholesterol (bad cholesterol) goal below 70 mg/dL. I also advised the  patient to eat a healthy diet with plenty of whole grains, cereals, fruits  and vegetables, exercise regularly and maintain ideal body weight.  Follow up in 6 months or call earlier if needed   Greater than 50% time during this 25 minute consultation visit was spent on counseling and coordination of care about HLD, and HTN, discussion about risk benefit of anticoagulation and answering questions.     Venancio Poisson, AGNP-BC  [facility] [address] [place], [place] [address]  Phone [phone] Fax [phone]

## 2017-10-25 ENCOUNTER — Ambulatory Visit: Payer: PPO | Admitting: Physical Therapy

## 2017-10-25 DIAGNOSIS — R262 Difficulty in walking, not elsewhere classified: Secondary | ICD-10-CM

## 2017-10-25 DIAGNOSIS — R2689 Other abnormalities of gait and mobility: Secondary | ICD-10-CM

## 2017-10-25 DIAGNOSIS — I69351 Hemiplegia and hemiparesis following cerebral infarction affecting right dominant side: Secondary | ICD-10-CM

## 2017-10-25 DIAGNOSIS — M6281 Muscle weakness (generalized): Secondary | ICD-10-CM

## 2017-10-25 DIAGNOSIS — R2681 Unsteadiness on feet: Secondary | ICD-10-CM

## 2017-10-25 NOTE — Therapy (Signed)
[facility] [address]  [place], [place], [address] Phone: [phone]   Fax:  [phone]  Physical Therapy Treatment  Patient Details  Name: Tara Dunn MRN: [id number] Date of Birth: 01/22/1941 Referring Provider: Venancio Poisson, NP   Encounter Date: 10/25/2017  PT End of Session - 10/25/17 1016    Visit Number  24    Number of Visits  25    Date for PT Re-Evaluation  10/28/17    Authorization Type  [employer]    PT Start Time  1016    PT Stop Time  1110    PT Time Calculation (min)  54 min    Activity Tolerance  Patient tolerated treatment well;Patient limited by fatigue    Behavior During Therapy  [facility] for tasks assessed/performed       Past Medical History:  Diagnosis Date  . 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 Lovena Le  . Aortic dilatation ([place])    a. 04/2014 CTA chest w/ incidental finding of distal thoracic Ao enlargement of 3.39 mm - f/u needed 04/2015.  . Arthritis    "all over my body"  . Cancer of left breast ([place]) 2009   s/p L mastectomy and chemo.  . Cardiomyopathy ([place])    a. 04/2014 Echo: EF 25-30%, mod conc LVH, possibl antsept HK, Gr1 DD, mod-sev dil LA.  . CHF (congestive heart failure) ([place])   . CKD (chronic kidney disease), stage III ([place])   . Depression   . GERD (gastroesophageal reflux disease)   . Gout   . Heart murmur   . History of stomach ulcers   . Hypertension    a. Dx @ age 58;  b. 04/2014 admission for HTN emergency.  . Obesity (BMI 30-39.9) 01/21/2015  . OSA on CPAP   . Stroke [facility])    a. 3 strokes - last in 1998; "memory problems since"  (03/26/2015)    Past Surgical History:  Procedure Laterality Date  . ABDOMINAL HYSTERECTOMY    . BREAST BIOPSY Left 2008  . CATARACT EXTRACTION, BILATERAL Bilateral   . DILATION AND CURETTAGE OF UTERUS    . EP IMPLANTABLE DEVICE N/A 03/26/2015   MDT single chamber  ICD, Dr. Lovena Le  . LEFT HEART CATHETERIZATION WITH CORONARY ANGIOGRAM N/A 05/07/2014   Procedure: LEFT HEART CATHETERIZATION WITH CORONARY ANGIOGRAM;  Surgeon: Belva Crome, MD;  Location: [facility] CATH LAB;  Service: Cardiovascular;  Laterality: N/A;  . MASTECTOMY Left 2009    There were no vitals filed for this visit.      [facility] PT Assessment - 10/25/17 1016      Assessment   Medical Diagnosis  CVA - L PLICA - R LE weakness    Referring Provider  Venancio Poisson, NP    Onset Date/Surgical Date  04/18/17    Hand Dominance  Right    Next MD Visit  6 months      Ambulation/Gait   Ambulation/Gait Assistance  6: Modified independent (Device/Increase time);5: Supervision    Ambulation/Gait Assistance Details  mod I with RW; supervision with [facility]    Assistive device  Rolling walker;Straight cane    Gait Pattern  Step-through pattern;Decreased stride length;Decreased arm swing - right;Decreased arm swing - left   decreased cadence   Ambulation Surface  Level;Indoor    Gait velocity  1.16 ft/sec with RW; 0.96 ft/sec with [facility]      Standardized Balance Assessment   10 Meter  Walk  28.25" with RW; 34.25" with SPC      Timed Up and Go Test   Normal TUG (seconds)  31.35   with RW; 36.47" with SPC                  [facility] Adult PT Treatment/Exercise - 10/25/17 1016      Exercises   Exercises  Knee/Hip      Knee/Hip Exercises: Aerobic   Nustep  L5 x 6 min (LE only) - VC to keep step rate at >60 steps/min      Knee/Hip Exercises: Standing   Heel Raises  Both;20 reps    Heel Raises Limitations  counter support    Hip Flexion  Right;Left;15 reps;Stengthening    Hip Flexion Limitations  marching in RW - targeting knee to height of crossbar on RW    Hip Abduction  Right;Left;10 reps;Knee straight;Stengthening    Abduction Limitations  yellow TB at ankles    Hip Extension  Right;Left;10 reps;Knee straight;Stengthening    Extension Limitations  yellow TB at ankles    Functional  Squat  10 reps;5 seconds    Functional Squat Limitations  counter squat      Knee/Hip Exercises: Seated   Ball Squeeze  B hip adduction pillow squeeze 15 x 5"    Clamshell with TheraBand  Red   alt hip ABD/ER 15 x 3"   Marching  Both;15 reps;Strengthening    Marching Limitations  yellow TB at knees    Sit to Sand  10 reps;with UE support   single HHA from standard height chair w/ armrests            PT Education - 10/25/17 1105    Education provided  Yes    Education Details  Final HEP review & update    Person(s) Educated  Patient    Methods  Explanation;Demonstration;Handout    Comprehension  Verbalized understanding;Returned demonstration       PT Short Term Goals - 10/18/17 1041      PT SHORT TERM GOAL #1   Title  Independent with initial HEP    Status  Achieved      PT SHORT TERM GOAL #2   Title  Increase gait speed to >/= 1.4 ft /sec to increase safety with limited community ambulation    Status  Not Met        PT Long Term Goals - 10/25/17 1027      PT LONG TERM GOAL #1   Title  Independent with ongoing/advanced HEP     Status  Partially Met      PT LONG TERM GOAL #2   Title  Increase B LE strength to >/= 4-/5 to 4/5 for increased ease of mobility    Status  Partially Met      PT LONG TERM GOAL #3   Title  Pt will complete sit to/from stand transfers w/o UE assist demonstrating good control    Status  Not Met      PT LONG TERM GOAL #4   Title  Decrease TUG time to </= 25 sec to reduce risk for falls during transitional movements    Status  Not Met      PT LONG TERM GOAL #5   Title  Increase gait speed to >/= 1.8 ft/sec with LRAD to reduce risk for recurrent falls    Status  Not Met      PT LONG TERM GOAL #6   Title  Increase  Berg to >/= 37/56 to reduce fall risk    Status  Achieved            Plan - 10/25/17 1110    Clinical Impression Statement  Tara Dunn has demonstrated good progress with PT but appears to have reached a plateau with  progress at this point. B LE strength has improved overall with R LE MMT now more equivalent to L LE, however continued proximal weakness exists bilaterally - pt has comprehensive HEP to address ongoing weakness and verbalizes understanding on need to complete HEP at least 5x/wk to continue to gain strength in hips. Gait pattern with RW has improved with increased stride length and foot clearance, however gait speed and TUG times have demonstrated recent leveling off and increased fluctuation over recent assessments, although improved since start of care. Overall balance and gait speed scores indicate a continued increased risk for falls and hence need for consistent use of AD with ambulation - pt aware of need to continue to use RW in community, and at least cane if not RW within home. Given recent plateauing of progress with proceed with discharge from PT with pt to continue with HEP and gait safety measures as above.    Rehab Potential  Good    PT Treatment/Interventions  Patient/family education;ADLs/Self Care Home Management;Therapeutic exercise;Therapeutic activities;Functional mobility training;Gait training;Balance training;Manual techniques;Passive range of motion;Dry needling;Taping;Cryotherapy;Moist Heat    PT Next Visit Plan  Discharge    Consulted and Agree with Plan of Care  Patient       Patient will benefit from skilled therapeutic intervention in order to improve the following deficits and impairments:  Decreased strength, Decreased mobility, Decreased balance, Decreased activity tolerance, Difficulty walking, Abnormal gait, Decreased endurance, Decreased safety awareness, Decreased knowledge of precautions  Visit Diagnosis: Hemiplegia and hemiparesis following cerebral infarction affecting right dominant side (HCC)  Muscle weakness (generalized)  Other abnormalities of gait and mobility  Unsteadiness on feet  Difficulty in walking, not elsewhere classified     Problem  List Patient Active Problem List   Diagnosis Date Noted  . Labile blood pressure   . Hypoalbuminemia due to protein-calorie malnutrition ([place])   . Acute blood loss anemia   . AKI (acute kidney injury) ([place])   . Peripheral edema   . Stage 3 chronic kidney disease ([place])   . Benign essential HTN   . History of CVA with residual deficit   . Cerebrovascular accident (CVA) due to thrombosis of left middle cerebral artery ([place])   . Right leg weakness 04/19/2017  . Right hemiparesis ([place]) 04/19/2017  . Thalamic infarct, acute ([place]) 04/19/2017  . Joint pain 12/09/2015  . ICD (implantable cardioverter-defibrillator) in place 03/26/2015  . Syncope 03/06/2015  . Obesity (BMI 30-39.9) 01/21/2015  . Excessive daytime sleepiness 08/04/2014  . OSA (obstructive sleep apnea) 08/04/2014  . Essential hypertension 06/13/2014  . Chronic systolic CHF (congestive heart failure) ([place]) 05/20/2014  . Congestive heart disease ([place])   . Chronic combined systolic and diastolic CHF (congestive heart failure) ([place]) 05/03/2014  . CKD (chronic kidney disease), stage III ([place]) 05/03/2014  . Hx of stroke without residual deficits 05/03/2014    PHYSICAL THERAPY DISCHARGE SUMMARY  Visits from Start of Care: 24  Current functional level related to goals / functional outcomes:   Refer to above clinical impression.   Remaining deficits:   As above.    Education / Equipment:   HEP; recommended continues use of RW for community ambulation and at least cane for  household ambulation  Plan: Patient agrees to discharge.  Patient goals were partially met. Patient is being discharged due to                                                    reaching a plateau with progress. ?????      Percival Spanish, PT, MPT 10/25/2017, 12:23 PM  [facility] [address]  [place], [place], [address] Phone: [phone]   Fax:  [phone]  Name: Tara Dunn MRN: [id number] Date of Birth: Jun 05, 1940

## 2017-10-25 NOTE — Progress Notes (Signed)
I agree with the above plan 

## 2017-10-27 ENCOUNTER — Other Ambulatory Visit: Payer: Self-pay | Admitting: *Deleted

## 2017-10-27 NOTE — Patient Outreach (Signed)
Telephone assessment per HTA Interdisciplinary Team referral regarding medication compliance. I was able to speak with both Mrs. Straka and her daughter Mr. Ethel Rana today. I advised why I was calling to follow up on an Rx that had not been picked up (colchicine) which pt has stated she hasn't needed that, she takes an allopurinol dose 200 mg daily. I advised she does have an Rx for the colchicine to pick up..I reviewed her other medications on her Epic med list and she states she is taking them. She also shares she has a nurse that comes out to fill a med box for her every 2 weeks. In, chart review, I see that the Paramedicine Team is involved and Orion Modest, EMT is seeing her regularly. I thanked the ladies for their time in talking with me to verify her medications. I also reminded them that [facility] is available if necessary.  I have also called Orion Modest, EMT, who has verified that she is seeing this pt and refilling her med box every other week and that indeed she does have all her meds. I did notify her that the pt has an Rx for cholchicine to be picked up for prn use.  Ms. Burt Knack shared with me that Mrs. Hippert is considering going into ALF. It is uncertain if anyone has completed an FL2 form for her. I t is also unclear if the daughter or patient has gone to Orth[facility] to apply for Medicaid for LTC. Mrs. Burt Knack has provided them a list of ALFs in the area. She has also agreed to find out on her next visit where they are with this process and let me know if we need to engage one of our LCSW to assist in completion of this task.  I surely appreciate the collaboration with the Paramedicine Team!  Eulah Pont. Myrtie Neither, MSN, [facility] Gerontological Nurse Practitioner [facility] Care Management [phone]

## 2017-11-02 NOTE — Progress Notes (Signed)
Patient ID: Tara Dunn, female   DOB: 03-Nov-1940, 77 y.o.   MRN: [id number]     Advanced Heart Failure Clinic Note   PCP: Dr. Ernie Dunn HF: Dr. Neldon Labella Dunn is a 77 y.o. female with long history of HTN, CVAs, CKD, and systolic CHF.    In 3/16, she was admitted a hypertensive emergency and acute CHF.  CTA chest showed no PE.  Echo showed EF 25-30% with moderate LVH.  She was diuresed and had a cardiac catheterization given extensive coronary calcification seen on CTA chest.  Cath showed diffuse moderate CAD, worst was 50-70% mRCA stenosis.   Admitted 04/19/2017 with RLE weakness. Had CVA. Later admitted to CIR. 04/22/2017- 05/11/2017. Entresto and lasix stopped. Discharged to her daughter house.   She returns today for HF follow up. Overall doing well. She finished PT last week. She is able to get around house and into garage with walker or cane. Denies SOB with this, but is sometimes SOB with ADLs. No orthopnea, PND, or edema. She feels like she has been getting stronger. No CP or dizziness. She had a cough with productive sputum, but it has resolved now. No fever or chills. She had stopped taking her lasix because she was losing too much weight, but Tara Dunn spoke with HF clinic and had her restart at 40 mg daily. Weight is down 2 lbs on our scale. She limits salt intake. Drinks about 2 L fluid/day. Followed by HF paramedicine.   Optivol: Thoracic impedence below threshold, but trending up. Optivol trending up. Very little activity. No VT/VF. Occasional bursts of afib (rep interrogated and this is NSR with PACs/PVCs).  Labs (3/16): K 4.2, creatinine 1.44, HCT 39.7 Labs (06/05/14): K 4.1, creatinine 2.01 Labs (5/16): K 4.7, creatinine 1.56 Labs (7/16): K 4.7, creatinine 1.68, BNP 14, LDL 62 Labs (11/16): K 4.2, creatinine 1.93 Labs (1/17): K 4.6, creatinine 1.65, HCT 39.2 Labs (4/17): K 4.5, creatinine 1.98 Labs (6/17): K 4, creatinine 2 Labs (8/17): K 4.4, creatinine 1.64   Labs  (10/17): K 3.9, creatinine 1.5, BNP 15 Labs (1/18): K 4.3, creatinine 1.59 Labs (4/18): LDL 46, HDL 57 Labs (5/18): K 4.6, creatinine 1.55 Labs (10/08/2016): K 4 Creatinine 1.9  Labs (2/82019): K 4.2 Creatinine 2.24 Labs (05/09/2017): K 4.5 Creatinine 1.73, Hgb 9.9    PMH: 1. CVA: 3 prior CVAs, last in 1998.  Thought to be related to HTN. 2. HTN: Long-standing, says she has been hypertensive since age 26. Renal artery dopplers (4/16) were normal.  3. CKD: Suspect related to HTN. 4. Breast cancer: 2009, left mastectomy with chemotherapy.  5. Gout 6. Obese 7. TAH 8. CAD: LHC (3/16) with moderate diffuse disease, worst lesion being 50-70% mid RCA.  9. Cardiomyopathy: Echo (3/16) with EF 25-30%, moderate LVH.  Echo (6/16) with EF 35-40%, moderate LVH, diffuse hypokinesis with septal-lateral dyssynchrony.  Cardiac MRI (12/16) with EF 32%, diffuse hypokinesis, no contrast given due to CKD.  - Medtronic ICD 2/17.  - Echo (5/18): EF 40%, diffuse hypokinesis, moderate LVH, normal RV size and systolic function.  - ECHO 04/20/2017- EF 45-50% Grade IDD 10. OSA: Using CPAP.  11. Event monitor (1/17) with NSR, occasional PVCs.   SH: From [place], moved to [place] in 2015 to be near daughter.  Single.  Quit smoking 2009.    FH: HTN runs in family.  Father with MI.   Review of systems complete and found to be negative unless listed in HPI.   Current Outpatient Medications  Medication Sig Dispense Refill  . allopurinol (ZYLOPRIM) 100 MG tablet Take 2 tablets (200 mg total) by mouth daily. 30 tablet 0  . atorvastatin (LIPITOR) 40 MG tablet Take 80 mg by mouth daily.     . Calcium Carbonate Antacid (ALKA-SELTZER ANTACID PO) Take by mouth as needed.    . carvedilol (COREG) 25 MG tablet TAKE 1 TABLET(25 MG) BY MOUTH TWICE DAILY 180 tablet 3  . clopidogrel (PLAVIX) 75 MG tablet Take 1 tablet (75 mg total) by mouth daily. 30 tablet 0  . colchicine 0.6 MG tablet Take 0.6 mg by mouth daily as needed (Gout flare up).     . DULoxetine (CYMBALTA) 30 MG capsule Take 30 mg by mouth daily.    . furosemide (LASIX) 20 MG tablet Take 4 tablets (80 mg total) by mouth daily. 120 tablet 3  . isosorbide-hydrALAZINE (BIDIL) 20-37.5 MG tablet Take 2 tablets by mouth 2 (two) times daily.    Marland Kitchen omeprazole (PRILOSEC) 20 MG capsule Take 20 mg by mouth daily. Can also take two times daily as needed    . potassium chloride SA (K-DUR,KLOR-CON) 20 MEQ tablet Take 20 mEq by mouth daily.     No current facility-administered medications for this encounter.      BP 122/78 (BP Location: Left Arm, Patient Position: Sitting)   Pulse 68   Wt 88.4 kg (194 lb 12.8 oz)   SpO2 94%   BMI 34.51 kg/m    Wt Readings from Last 3 Encounters:  11/03/17 88.4 kg (194 lb 12.8 oz)  10/24/17 88.2 kg (194 lb 6.4 oz)  10/20/17 88.1 kg (194 lb 3.2 oz)   General: Elderly. No resp difficulty. Walked into clinic with walker.  HEENT: Normal Neck: Supple. JVP flat. Carotids 2+ bilat; no bruits. No thyromegaly or nodule noted. Cor: PMI nondisplaced. ?IRR, No M/G/R noted Lungs: CTAB, normal effort. Abdomen: Soft, non-tender, non-distended, no HSM. No bruits or masses. +BS  Extremities: No cyanosis, clubbing, or rash. R and LLE no edema.  Neuro: Alert & orientedx3, cranial nerves grossly intact. moves all 4 extremities w/o difficulty. Affect pleasant  EKG: NSR with sinus arrhythmias  Assessment/Plan: 1. 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 S/p Medtronic ICD ECHO 04/2017 EF 45-50% . NYHA III- IIIB. Volume status stable on exam, but trending up on optivol - Continue lasix 40 mg daily. Take extra 40 mg PRN. - Continue carvedilol 25 mg twice a day.  - Continue bidil 2 tabs three times a day.  - Has not been on ARB/spiro with CKD - Followed by HF paramedicine.  2. HTN: Has history of CVA and CKD.  Renal artery dopplers were normal.   - Stable. 3. CAD: Moderate diffuse CAD, no severe obstruction.  This did not  cause her cardiomyopathy.  - No s/s ischemia - Continue atorvastatin 40 mg daily and plavix 75 mg daily, good lipids 3/19  4. CKD III:  Creatinine baseline 1.8-2.2. BMET 8/15 stable. 5. OSA: Not using CPAP. No change. 6. CVA: - Continue statin and plavix.  Follows with neuro. 7. Deconditioning - CSW working on getting her into ALF. She has completed PT outpatient and feels stronger. 8. Afib on device interrogation - Rep interrogated device and she is actually just having PACs and PVCs. No Afib seen.  BMET, mag Follow up in 3 months.  Georgiana Shore, NP 11/03/2017  Greater than 50% of the 25 minute visit was spent in counseling/coordination of care regarding disease state education, salt/fluid  restriction, sliding scale diuretics, and medication compliance.

## 2017-11-03 ENCOUNTER — Other Ambulatory Visit (HOSPITAL_COMMUNITY): Payer: Self-pay

## 2017-11-03 ENCOUNTER — Ambulatory Visit (HOSPITAL_COMMUNITY)
Admission: RE | Admit: 2017-11-03 | Discharge: 2017-11-03 | Disposition: A | Discharge status: Home / Self Care | Payer: PPO | Source: Ambulatory Visit | Attending: Cardiology | Admitting: Cardiology

## 2017-11-03 ENCOUNTER — Telehealth (HOSPITAL_COMMUNITY): Payer: Self-pay

## 2017-11-03 VITALS — BP 122/78 | HR 68 | Wt 194.8 lb

## 2017-11-03 DIAGNOSIS — I428 Other cardiomyopathies: Secondary | ICD-10-CM | POA: Diagnosis not present

## 2017-11-03 DIAGNOSIS — Z6834 Body mass index (BMI) 34.0-34.9, adult: Secondary | ICD-10-CM | POA: Diagnosis not present

## 2017-11-03 DIAGNOSIS — R5381 Other malaise: Secondary | ICD-10-CM | POA: Diagnosis not present

## 2017-11-03 DIAGNOSIS — Z87891 Personal history of nicotine dependence: Secondary | ICD-10-CM | POA: Insufficient documentation

## 2017-11-03 DIAGNOSIS — Z95810 Presence of automatic (implantable) cardiac defibrillator: Secondary | ICD-10-CM | POA: Insufficient documentation

## 2017-11-03 DIAGNOSIS — Z7902 Long term (current) use of antithrombotics/antiplatelets: Secondary | ICD-10-CM | POA: Diagnosis not present

## 2017-11-03 DIAGNOSIS — I10 Essential (primary) hypertension: Secondary | ICD-10-CM | POA: Diagnosis not present

## 2017-11-03 DIAGNOSIS — N183 Chronic kidney disease, stage 3 unspecified: Secondary | ICD-10-CM

## 2017-11-03 DIAGNOSIS — I5022 Chronic systolic (congestive) heart failure: Secondary | ICD-10-CM | POA: Insufficient documentation

## 2017-11-03 DIAGNOSIS — I491 Atrial premature depolarization: Secondary | ICD-10-CM | POA: Diagnosis not present

## 2017-11-03 DIAGNOSIS — I63312 Cerebral infarction due to thrombosis of left middle cerebral artery: Secondary | ICD-10-CM | POA: Diagnosis not present

## 2017-11-03 DIAGNOSIS — Z8249 Family history of ischemic heart disease and other diseases of the circulatory system: Secondary | ICD-10-CM | POA: Diagnosis not present

## 2017-11-03 DIAGNOSIS — I2510 Atherosclerotic heart disease of native coronary artery without angina pectoris: Secondary | ICD-10-CM | POA: Diagnosis not present

## 2017-11-03 DIAGNOSIS — Z8673 Personal history of transient ischemic attack (TIA), and cerebral infarction without residual deficits: Secondary | ICD-10-CM | POA: Diagnosis not present

## 2017-11-03 DIAGNOSIS — E669 Obesity, unspecified: Secondary | ICD-10-CM | POA: Diagnosis not present

## 2017-11-03 DIAGNOSIS — M109 Gout, unspecified: Secondary | ICD-10-CM | POA: Diagnosis not present

## 2017-11-03 DIAGNOSIS — G4733 Obstructive sleep apnea (adult) (pediatric): Secondary | ICD-10-CM | POA: Diagnosis not present

## 2017-11-03 DIAGNOSIS — I493 Ventricular premature depolarization: Secondary | ICD-10-CM | POA: Diagnosis not present

## 2017-11-03 DIAGNOSIS — I4891 Unspecified atrial fibrillation: Secondary | ICD-10-CM | POA: Insufficient documentation

## 2017-11-03 DIAGNOSIS — Z853 Personal history of malignant neoplasm of breast: Secondary | ICD-10-CM | POA: Insufficient documentation

## 2017-11-03 DIAGNOSIS — Z79899 Other long term (current) drug therapy: Secondary | ICD-10-CM | POA: Insufficient documentation

## 2017-11-03 DIAGNOSIS — Z9012 Acquired absence of left breast and nipple: Secondary | ICD-10-CM | POA: Diagnosis not present

## 2017-11-03 DIAGNOSIS — I130 Hypertensive heart and chronic kidney disease with heart failure and stage 1 through stage 4 chronic kidney disease, or unspecified chronic kidney disease: Secondary | ICD-10-CM | POA: Insufficient documentation

## 2017-11-03 LAB — BASIC METABOLIC PANEL
ANION GAP: 12 (ref 5–15)
BUN: 34 mg/dL — ABNORMAL HIGH (ref 8–23)
CO2: 26 mmol/L (ref 22–32)
CREATININE: 1.8 mg/dL — AB (ref 0.44–1.00)
Calcium: 9.1 mg/dL (ref 8.9–10.3)
Chloride: 100 mmol/L (ref 98–111)
GFR, EST AFRICAN AMERICAN: 30 mL/min — AB (ref >60)
GFR, EST NON AFRICAN AMERICAN: 26 mL/min — AB (ref >60)
GLUCOSE: 105 mg/dL — AB (ref 70–99)
Potassium: 3.4 mmol/L — ABNORMAL LOW (ref 3.5–5.1)
Sodium: 138 mmol/L (ref 135–145)

## 2017-11-03 LAB — MAGNESIUM: Magnesium: 1 mg/dL — ABNORMAL LOW (ref 1.7–2.4)

## 2017-11-03 MED ORDER — MAGNESIUM OXIDE 400 MG PO TABS: 400.0000 mg | ORAL_TABLET | Freq: Two times a day (BID) | ORAL | 3 refills | Status: AC

## 2017-11-03 MED ORDER — FUROSEMIDE 20 MG PO TABS: 40.0000 mg | ORAL_TABLET | Freq: Every day | ORAL | 3 refills | Status: AC

## 2017-11-03 NOTE — Addendum Note (Signed)
Encounter addended by: Jorge Ny, LCSW on: 11/03/2017 3:23 PM  Actions taken: Sign clinical note

## 2017-11-03 NOTE — Progress Notes (Signed)
Paramedicine Encounter   Patient ID: Tara Dunn , female,   DOB: 07-03-1940,76 y.o.,  MRN: [id number]   Met patient in clinic today with provider Caryl Pina.    Pt finished out pt physical therapy last week.  She stated that feels a lot stronger with her strength.  Pt denies swelling reported by pt.  She's currently taking 26m lasix. Pt has a cough during night, which her son-in law gave her.  Her daughter stated that it was a home remendy which included honey. Pt no longer has a cough.  Pt stated that she's weighing about 194lbs daily.   Spoke with JEliezer Loftsduring appointment.  JEliezer Loftswill relay information obtain during this visit.  $1300 monthly income. Someone from [facility] will call pt and family for a in home assessment to see if she qualifies for the services offered.   Time spent with patient 45 mins   [place] EMT-Paramedic [phone]/26/2019   ACTION: Home visit completed

## 2017-11-03 NOTE — Telephone Encounter (Signed)
I called pt and spoke with her daughter about the medication changes due to [facility] today.  Heather advised pt to take 40 meq Potassium today and start back taking 20 meq daily/magnesium 400 mg daily.    Pt's daughter stated that she had already picked up the magnesium rx/ I explained how to revise pt's pill box, which she stated that she understood.  Her daughter advised to call back if she has any questions.

## 2017-11-03 NOTE — Patient Instructions (Signed)
Take lasix (furosemide) 40mg  (2 tablets) daily and another 40mg   as needs   Labs today  Follow up in 3 months

## 2017-11-03 NOTE — Progress Notes (Signed)
CSW met with patient and patient daughter, Tara Dunn, to discuss their desire for patient to be placed at ALF.  Pt and dtr report that they have been looking into ALF options but they have all been too expensive.  Patient reports making $1,300/month between her social security retirement and a pension from a job in [place].  Patient makes too much to qualify for Medicaid at ALF but reports not having any savings or assets that could be used to pay the remainder privately.  CSW spoke with ALF assistance worker, Thayer Headings, who reports that Tara Dunn is currently having a special for a semi private room that would cost $1,300/month for room and board plus cost for level of care- minimum charge would likely be around $350.  Thayer Headings will have ALF follow up with pt and daughter to complete needs assessment and daughter will speak with other family members to see if they can assist with additional costs for patient's care.  No further needs at this time- CSW will continue to assist as needed  Jorge Ny, [place] Social Worker [phone]

## 2017-11-03 NOTE — Telephone Encounter (Signed)
Pt called left message for to call back, [facility] paramedic called and informed of medication changes

## 2017-11-04 ENCOUNTER — Other Ambulatory Visit: Payer: Self-pay | Admitting: *Deleted

## 2017-11-04 ENCOUNTER — Telehealth (HOSPITAL_COMMUNITY): Payer: Self-pay

## 2017-11-04 NOTE — Patient Outreach (Signed)
THN Update. I have reviewed Mrs. Korman chart since my initial inquiry. She continues to be active with the Paramedicine team who is assessing her freqently and interveneing as necessary. Pt daughter is vigilent and they have received an application for admission into [facility]. Ree Kida is providing excellent oversight on this case. No further needs from [facility].  Eulah Pont. Myrtie Neither, MSN, [facility] Gerontological Nurse Practitioner [facility] Care Management [phone]

## 2017-11-04 NOTE — Telephone Encounter (Signed)
Patient's daughter called for information for social worker at [place] and [employer]. Heart Failure to relay financial information for possible SNF placement.    Tammy Sours was contacted by myself about same. She advised me to tell patient to contact [facility]. She stated if patient has any further concerns to contact her or Kennyth Lose B. At the clinic.

## 2017-11-07 ENCOUNTER — Encounter (HOSPITAL_COMMUNITY): Payer: Self-pay | Admitting: Licensed Clinical Social Worker

## 2017-11-07 NOTE — Progress Notes (Unsigned)
Patient ID: Tara Dunn, female   DOB: [birth year], 77 y.o.   MRN: [id number]   CSW informed by ALF navigator that patient will need fl2 and TB test in order to move forward with ALF placement.  CSW called pt daughter and informed that she will need to set up appointment for pt at her PCP to have fl2 completed and have TB test done- pt dtr expressed understanding and states they have a meeting with the facility this morning to continue moving forward.  CSW will continue to follow in clinic and assist as needed.  Jorge Ny, LCSW Clinical Social Worker [phone]

## 2017-11-08 ENCOUNTER — Telehealth (HOSPITAL_COMMUNITY): Payer: Self-pay

## 2017-11-08 NOTE — Telephone Encounter (Signed)
Tara Dunn with [facility] called to f/u about Tara Dunn medications and pt's status on assistant living placement.

## 2017-11-10 DIAGNOSIS — I5022 Chronic systolic (congestive) heart failure: Secondary | ICD-10-CM | POA: Diagnosis not present

## 2017-11-10 DIAGNOSIS — G8191 Hemiplegia, unspecified affecting right dominant side: Secondary | ICD-10-CM | POA: Diagnosis not present

## 2017-11-10 DIAGNOSIS — G4733 Obstructive sleep apnea (adult) (pediatric): Secondary | ICD-10-CM | POA: Diagnosis not present

## 2017-11-14 DIAGNOSIS — I509 Heart failure, unspecified: Secondary | ICD-10-CM | POA: Diagnosis not present

## 2017-11-14 DIAGNOSIS — I10 Essential (primary) hypertension: Secondary | ICD-10-CM | POA: Diagnosis not present

## 2017-11-14 DIAGNOSIS — I639 Cerebral infarction, unspecified: Secondary | ICD-10-CM | POA: Diagnosis not present

## 2017-11-14 DIAGNOSIS — N181 Chronic kidney disease, stage 1: Secondary | ICD-10-CM | POA: Diagnosis not present

## 2017-11-17 ENCOUNTER — Ambulatory Visit (HOSPITAL_COMMUNITY)
Admission: RE | Admit: 2017-11-17 | Discharge: 2017-11-17 | Disposition: A | Discharge status: Home / Self Care | Payer: PPO | Source: Ambulatory Visit | Attending: Internal Medicine | Admitting: Internal Medicine

## 2017-11-17 ENCOUNTER — Other Ambulatory Visit (HOSPITAL_COMMUNITY): Payer: Self-pay

## 2017-11-17 ENCOUNTER — Telehealth (HOSPITAL_COMMUNITY): Payer: Self-pay

## 2017-11-17 DIAGNOSIS — I5042 Chronic combined systolic (congestive) and diastolic (congestive) heart failure: Secondary | ICD-10-CM | POA: Insufficient documentation

## 2017-11-17 LAB — BASIC METABOLIC PANEL
Anion gap: 6 (ref 5–15)
BUN: 24 mg/dL — ABNORMAL HIGH (ref 8–23)
CHLORIDE: 105 mmol/L (ref 98–111)
CO2: 28 mmol/L (ref 22–32)
Calcium: 10 mg/dL (ref 8.9–10.3)
Creatinine, Ser: 1.42 mg/dL — ABNORMAL HIGH (ref 0.44–1.00)
GFR calc Af Amer: 40 mL/min — ABNORMAL LOW (ref >60)
GFR calc non Af Amer: 35 mL/min — ABNORMAL LOW (ref >60)
GLUCOSE: 96 mg/dL (ref 70–99)
Potassium: 4 mmol/L (ref 3.5–5.1)
Sodium: 139 mmol/L (ref 135–145)

## 2017-11-17 NOTE — Telephone Encounter (Signed)
I called to remind pt of the appointment that she has at 10:40 am today.

## 2017-11-17 NOTE — Progress Notes (Signed)
Paramedicine Encounter    Patient ID: Tara Dunn, female    DOB: 12/12/1940, 77 y.o.   MRN: [id number]    Patient Care Team: Fanny Bien, MD as PCP - General (Family Medicine)  Patient Active Problem List   Diagnosis Date Noted  . Labile blood pressure   . Hypoalbuminemia due to protein-calorie malnutrition ([place])   . Acute blood loss anemia   . AKI (acute kidney injury) ([place])   . Peripheral edema   . Stage 3 chronic kidney disease ([place])   . Benign essential HTN   . History of CVA with residual deficit   . Cerebrovascular accident (CVA) due to thrombosis of left middle cerebral artery ([place])   . Right leg weakness 04/19/2017  . Right hemiparesis ([place]) 04/19/2017  . Thalamic infarct, acute ([place]) 04/19/2017  . Joint pain 12/09/2015  . ICD (implantable cardioverter-defibrillator) in place 03/26/2015  . Syncope 03/06/2015  . Obesity (BMI 30-39.9) 01/21/2015  . Excessive daytime sleepiness 08/04/2014  . OSA (obstructive sleep apnea) 08/04/2014  . Essential hypertension 06/13/2014  . Chronic systolic CHF (congestive heart failure) ([place]) 05/20/2014  . Congestive heart disease ([place])   . Chronic combined systolic and diastolic CHF (congestive heart failure) ([place]) 05/03/2014  . CKD (chronic kidney disease), stage III ([place]) 05/03/2014  . Hx of stroke without residual deficits 05/03/2014    Current Outpatient Medications:  .  allopurinol (ZYLOPRIM) 100 MG tablet, Take 2 tablets (200 mg total) by mouth daily., Disp: 30 tablet, Rfl: 0 .  atorvastatin (LIPITOR) 40 MG tablet, Take 80 mg by mouth daily. , Disp: , Rfl:  .  Calcium Carbonate Antacid (ALKA-SELTZER ANTACID PO), Take by mouth as needed., Disp: , Rfl:  .  carvedilol (COREG) 25 MG tablet, TAKE 1 TABLET(25 MG) BY MOUTH TWICE DAILY, Disp: 180 tablet, Rfl: 3 .  clopidogrel (PLAVIX) 75 MG tablet, Take 1 tablet (75 mg total) by mouth daily., Disp: 30 tablet, Rfl: 0 .  colchicine 0.6 MG tablet, Take 0.6 mg by mouth daily as  needed (Gout flare up)., Disp: , Rfl:  .  DULoxetine (CYMBALTA) 30 MG capsule, Take 30 mg by mouth daily., Disp: , Rfl:  .  furosemide (LASIX) 20 MG tablet, Take 2 tablets (40 mg total) by mouth daily. Can take extra 40 mg as needed, Disp: 120 tablet, Rfl: 3 .  isosorbide-hydrALAZINE (BIDIL) 20-37.5 MG tablet, Take 2 tablets by mouth 2 (two) times daily., Disp: , Rfl:  .  magnesium oxide (MAG-OX) 400 MG tablet, Take 1 tablet (400 mg total) by mouth 2 (two) times daily., Disp: 60 tablet, Rfl: 3 .  omeprazole (PRILOSEC) 20 MG capsule, Take 20 mg by mouth daily. Can also take two times daily as needed, Disp: , Rfl:  .  potassium chloride SA (K-DUR,KLOR-CON) 20 MEQ tablet, Take 20 mEq by mouth daily., Disp: , Rfl:  Allergies  Allergen Reactions  . Shellfish Allergy Hives     Social History   Socioeconomic History  . Marital status: Widowed    Spouse name: Not on file  . Number of children: Not on file  . Years of education: Not on file  . Highest education level: Not on file  Occupational History  . Not on file  Social Needs  . Financial resource strain: Not on file  . Food insecurity:    Worry: Not on file    Inability: Not on file  . Transportation needs:    Medical: Not on file    Non-medical: Not on  file  Tobacco Use  . Smoking status: Former Smoker    Packs/day: 0.50    Years: 40.00    Pack years: 20.00    Types: Cigarettes  . Smokeless tobacco: Never Used  . Tobacco comment: Quit in 2009.  Substance and Sexual Activity  . Alcohol use: No    Alcohol/week: 0.0 standard drinks    Comment: "drank occasionally when I was young; last drink was in the late 1990s"  . Drug use: No  . Sexual activity: Never  Lifestyle  . Physical activity:    Days per week: Not on file    Minutes per session: Not on file  . Stress: Not on file  Relationships  . Social connections:    Talks on phone: Not on file    Gets together: Not on file    Attends religious service: Not on file     Active member of club or organization: Not on file    Attends meetings of clubs or organizations: Not on file    Relationship status: Not on file  . Intimate partner violence:    Fear of current or ex partner: Not on file    Emotionally abused: Not on file    Physically abused: Not on file    Forced sexual activity: Not on file  Other Topics Concern  . Not on file  Social History Narrative   Lives alone in an independent living facility.  Education: 10th grade.     Retired from [employer] work.  Moved here from [place] in 2015.    Physical Exam      Future Appointments  Date Time Provider [place]  02/02/2018  9:00 AM [facility] PA/NP [facility] None  04/24/2018  2:45 PM Venancio Poisson, NP [facility] None       ATF [name] x4/pt met at the heart failure clinic during lab appointment. Pt's pill box refilled. Pt denies sob, chest pain and dizziness.     Medication ordered: none Key[place]a [place], EMT Paramedic [phone] 11/17/2017    ACTION: Home visit completed

## 2017-11-17 NOTE — Addendum Note (Signed)
Encounter addended by: Kerry Dory, CMA on: 11/17/2017 2:18 PM  Actions taken: Order list changed, Diagnosis association updated

## 2017-11-22 ENCOUNTER — Telehealth (HOSPITAL_COMMUNITY): Payer: Self-pay | Admitting: Cardiology

## 2017-11-22 DIAGNOSIS — I509 Heart failure, unspecified: Secondary | ICD-10-CM

## 2017-11-22 NOTE — Telephone Encounter (Signed)
Notes recorded by Kerry Dory, CMA on 11/22/2017 at 9:16 AM EDT Repeat labs 10/16 ------  Notes recorded by Georgiana Shore, NP on 11/22/2017 at 8:12 AM EDT It doesn't look like they ran the mag level. Can we get her to come back in to check a mag? Thanks ------  Notes recorded by Kerry Dory, CMA on 11/17/2017 at 2:19 PM EDT magnesium level added ------  Notes recorded by Georgiana Shore, NP on 11/17/2017 at 12:23 PM EDT Potassium stable. She was also supposed to get mag drawn today. Can you call lab and have them add it on? Thanks

## 2017-11-22 NOTE — Telephone Encounter (Signed)
-----   Message from Georgiana Shore, NP sent at 11/22/2017  8:12 AM EDT ----- It doesn't look like they ran the mag level. Can we get her to come back in to check a mag? Thanks

## 2017-11-23 ENCOUNTER — Ambulatory Visit (HOSPITAL_COMMUNITY)
Admission: RE | Admit: 2017-11-23 | Discharge: 2017-11-23 | Disposition: A | Discharge status: Home / Self Care | Payer: PPO | Source: Ambulatory Visit | Attending: Internal Medicine | Admitting: Internal Medicine

## 2017-11-23 DIAGNOSIS — I509 Heart failure, unspecified: Secondary | ICD-10-CM | POA: Diagnosis not present

## 2017-11-23 LAB — MAGNESIUM: MAGNESIUM: 1.8 mg/dL (ref 1.7–2.4)

## 2017-11-24 ENCOUNTER — Other Ambulatory Visit: Payer: Self-pay

## 2017-11-24 ENCOUNTER — Encounter (HOSPITAL_COMMUNITY): Payer: Self-pay | Admitting: Emergency Medicine

## 2017-11-24 ENCOUNTER — Emergency Department (HOSPITAL_COMMUNITY): Payer: PPO

## 2017-11-24 ENCOUNTER — Observation Stay (HOSPITAL_COMMUNITY)
Admission: EM | Admit: 2017-11-24 | Discharge: 2017-11-30 | Disposition: A | Discharge status: Home / Self Care | Payer: PPO | Attending: Internal Medicine | Admitting: Internal Medicine

## 2017-11-24 DIAGNOSIS — R296 Repeated falls: Secondary | ICD-10-CM

## 2017-11-24 DIAGNOSIS — G4733 Obstructive sleep apnea (adult) (pediatric): Secondary | ICD-10-CM | POA: Diagnosis not present

## 2017-11-24 DIAGNOSIS — E669 Obesity, unspecified: Secondary | ICD-10-CM | POA: Diagnosis not present

## 2017-11-24 DIAGNOSIS — Z853 Personal history of malignant neoplasm of breast: Secondary | ICD-10-CM | POA: Diagnosis not present

## 2017-11-24 DIAGNOSIS — Z7901 Long term (current) use of anticoagulants: Secondary | ICD-10-CM | POA: Insufficient documentation

## 2017-11-24 DIAGNOSIS — M79652 Pain in left thigh: Secondary | ICD-10-CM | POA: Diagnosis not present

## 2017-11-24 DIAGNOSIS — Z79899 Other long term (current) drug therapy: Secondary | ICD-10-CM | POA: Diagnosis not present

## 2017-11-24 DIAGNOSIS — M6281 Muscle weakness (generalized): Secondary | ICD-10-CM | POA: Diagnosis not present

## 2017-11-24 DIAGNOSIS — Z87891 Personal history of nicotine dependence: Secondary | ICD-10-CM | POA: Insufficient documentation

## 2017-11-24 DIAGNOSIS — S79922A Unspecified injury of left thigh, initial encounter: Secondary | ICD-10-CM | POA: Diagnosis not present

## 2017-11-24 DIAGNOSIS — M25552 Pain in left hip: Secondary | ICD-10-CM | POA: Insufficient documentation

## 2017-11-24 DIAGNOSIS — I130 Hypertensive heart and chronic kidney disease with heart failure and stage 1 through stage 4 chronic kidney disease, or unspecified chronic kidney disease: Secondary | ICD-10-CM | POA: Insufficient documentation

## 2017-11-24 DIAGNOSIS — I5042 Chronic combined systolic (congestive) and diastolic (congestive) heart failure: Secondary | ICD-10-CM | POA: Insufficient documentation

## 2017-11-24 DIAGNOSIS — I69351 Hemiplegia and hemiparesis following cerebral infarction affecting right dominant side: Secondary | ICD-10-CM | POA: Insufficient documentation

## 2017-11-24 DIAGNOSIS — N183 Chronic kidney disease, stage 3 unspecified: Secondary | ICD-10-CM | POA: Diagnosis present

## 2017-11-24 DIAGNOSIS — N3000 Acute cystitis without hematuria: Secondary | ICD-10-CM

## 2017-11-24 DIAGNOSIS — Z9181 History of falling: Secondary | ICD-10-CM | POA: Diagnosis not present

## 2017-11-24 DIAGNOSIS — I10 Essential (primary) hypertension: Secondary | ICD-10-CM | POA: Diagnosis present

## 2017-11-24 DIAGNOSIS — I6502 Occlusion and stenosis of left vertebral artery: Secondary | ICD-10-CM | POA: Insufficient documentation

## 2017-11-24 DIAGNOSIS — R69 Illness, unspecified: Secondary | ICD-10-CM

## 2017-11-24 DIAGNOSIS — R531 Weakness: Principal | ICD-10-CM | POA: Insufficient documentation

## 2017-11-24 DIAGNOSIS — R29898 Other symptoms and signs involving the musculoskeletal system: Secondary | ICD-10-CM | POA: Diagnosis present

## 2017-11-24 DIAGNOSIS — S79912A Unspecified injury of left hip, initial encounter: Secondary | ICD-10-CM | POA: Diagnosis not present

## 2017-11-24 LAB — COMPREHENSIVE METABOLIC PANEL
ALBUMIN: 3.5 g/dL (ref 3.5–5.0)
ALK PHOS: 98 U/L (ref 38–126)
ALT: 14 U/L (ref 0–44)
ANION GAP: 8 (ref 5–15)
AST: 17 U/L (ref 15–41)
BILIRUBIN TOTAL: 0.6 mg/dL (ref 0.3–1.2)
BUN: 23 mg/dL (ref 8–23)
CO2: 26 mmol/L (ref 22–32)
CREATININE: 1.53 mg/dL — AB (ref 0.44–1.00)
Calcium: 10.2 mg/dL (ref 8.9–10.3)
Chloride: 103 mmol/L (ref 98–111)
GFR calc Af Amer: 37 mL/min — ABNORMAL LOW (ref >60)
GFR calc non Af Amer: 32 mL/min — ABNORMAL LOW (ref >60)
GLUCOSE: 131 mg/dL — AB (ref 70–99)
Potassium: 4.1 mmol/L (ref 3.5–5.1)
SODIUM: 137 mmol/L (ref 135–145)
TOTAL PROTEIN: 6.2 g/dL — AB (ref 6.5–8.1)

## 2017-11-24 LAB — CBC
HEMATOCRIT: 38.1 % (ref 36.0–46.0)
HEMOGLOBIN: 11.4 g/dL — AB (ref 12.0–15.0)
MCH: 25.8 pg — ABNORMAL LOW (ref 26.0–34.0)
MCHC: 29.9 g/dL — ABNORMAL LOW (ref 30.0–36.0)
MCV: 86.2 fL (ref 80.0–100.0)
NRBC: 0 % (ref 0.0–0.2)
Platelets: 234 10*3/uL (ref 150–400)
RBC: 4.42 MIL/uL (ref 3.87–5.11)
RDW: 18.4 % — AB (ref 11.5–15.5)
WBC: 4.7 10*3/uL (ref 4.0–10.5)

## 2017-11-24 LAB — I-STAT CHEM 8, ED
BUN: 26 mg/dL — ABNORMAL HIGH (ref 8–23)
CALCIUM ION: 1.35 mmol/L (ref 1.15–1.40)
CHLORIDE: 101 mmol/L (ref 98–111)
Creatinine, Ser: 1.5 mg/dL — ABNORMAL HIGH (ref 0.44–1.00)
Glucose, Bld: 126 mg/dL — ABNORMAL HIGH (ref 70–99)
HEMATOCRIT: 37 % (ref 36.0–46.0)
Hemoglobin: 12.6 g/dL (ref 12.0–15.0)
Potassium: 4.1 mmol/L (ref 3.5–5.1)
SODIUM: 137 mmol/L (ref 135–145)
TCO2: 27 mmol/L (ref 22–32)

## 2017-11-24 LAB — DIFFERENTIAL
Abs Immature Granulocytes: 0.01 10*3/uL (ref 0.00–0.07)
BASOS ABS: 0 10*3/uL (ref 0.0–0.1)
Basophils Relative: 1 %
EOS PCT: 2 %
Eosinophils Absolute: 0.1 10*3/uL (ref 0.0–0.5)
IMMATURE GRANULOCYTES: 0 %
LYMPHS ABS: 0.9 10*3/uL (ref 0.7–4.0)
LYMPHS PCT: 20 %
Monocytes Absolute: 0.4 10*3/uL (ref 0.1–1.0)
Monocytes Relative: 9 %
NEUTROS ABS: 3.2 10*3/uL (ref 1.7–7.7)
Neutrophils Relative %: 68 %

## 2017-11-24 LAB — URINALYSIS, ROUTINE W REFLEX MICROSCOPIC
BILIRUBIN URINE: NEGATIVE
GLUCOSE, UA: NEGATIVE mg/dL
Hgb urine dipstick: NEGATIVE
KETONES UR: NEGATIVE mg/dL
LEUKOCYTES UA: NEGATIVE
NITRITE: POSITIVE — AB
PH: 6 (ref 5.0–8.0)
Protein, ur: NEGATIVE mg/dL
Specific Gravity, Urine: 1.014 (ref 1.005–1.030)

## 2017-11-24 LAB — APTT: aPTT: 32 seconds (ref 24–36)

## 2017-11-24 LAB — CBG MONITORING, ED: Glucose-Capillary: 95 mg/dL (ref 70–99)

## 2017-11-24 LAB — I-STAT TROPONIN, ED: Troponin i, poc: 0.02 ng/mL (ref 0.00–0.08)

## 2017-11-24 LAB — PROTIME-INR
INR: 1.06
PROTHROMBIN TIME: 13.7 s (ref 11.4–15.2)

## 2017-11-24 MED ORDER — ONDANSETRON HCL 4 MG/2ML IJ SOLN: 4.0000 mg | Freq: Three times a day (TID) | INTRAMUSCULAR | Status: DC | PRN

## 2017-11-24 MED ORDER — ACETAMINOPHEN 325 MG PO TABS: 650.0000 mg | ORAL_TABLET | Freq: Four times a day (QID) | ORAL | Status: DC | PRN

## 2017-11-24 MED ORDER — SODIUM CHLORIDE 0.9 % IV SOLN
1.0000 g | Freq: Once | INTRAVENOUS | Status: AC
  Administered 2017-11-24: 1 g via INTRAVENOUS
  Filled 2017-11-24: qty 10

## 2017-11-24 MED ORDER — ZOLPIDEM TARTRATE 5 MG PO TABS: 5.0000 mg | ORAL_TABLET | Freq: Every evening | ORAL | Status: DC | PRN

## 2017-11-24 MED ORDER — HYDRALAZINE HCL 20 MG/ML IJ SOLN: 5.0000 mg | INTRAMUSCULAR | Status: DC | PRN

## 2017-11-24 NOTE — ED Notes (Signed)
Pt's family member reports the patient has been having bladder incontinence lately.

## 2017-11-24 NOTE — ED Provider Notes (Signed)
4:12 PM Care assumed from Dr. Vallery Ridge.  At time of transfer care, patient is awaiting x-ray of her hip to look for fracture after her falls.  Patient will also need urinalysis given the report of incontinence and frequency.  Plan of care is to attempt ambulation with patient if x-ray shows no fractures.  If patient continues to have weakness or difficulty with ambulation on exam, given the left worse than right weakness in the falls with history of stroke, would consider MRI to further evaluate.  Next  Anticipate reassessment after imaging.  X-rays show no fracture or dislocation.  Patient will have the ambulation test and a discussion with family to discuss further work-up plans.  6:38 PM Patient's x-rays did not show evidence of new fracture dislocation of the hip.  Patient was able to have assistance to get to a walker and then shuffle around with ambulation.  Patient says she still feels that her left leg is weaker than the right which is very abnormal to prior.  She reports this is gone on for the last few weeks.  Patient and family are concerned that this new weakness in the left leg is causing her to have the falls.  Due to history of stroke including new weakness on the left, patient will have MRI.  Patient also reports some urinary symptoms including frequency and some incontinence, will obtain urine.    Anticipate reassessment after MRI and urinalysis.    11:15 PM Urinalysis shows positive nitrites and bacteria.  Given the patient's frequency, occasional incontinence, fatigue, and multiple falls, patient will be treated for UTI.  Patient has to have a representative from the [employer] come and be at the bedside when the MRI is performed.  Due to the patient's new weakness in the left leg, MRI will still be ordered.  Patient will be admitted for treatment for her UTI, possible new stroke versus reactivation of prior stroke in the setting of UTI, and to be evaluated by PT/OT for her multiple  falls and instability.  Patient was given antibiotics and admitted.    Clinical Impression: 1. Frequent falls   2. Left hip pain   3. Severe comorbid illness   4. Acute cystitis without hematuria     Disposition: Admit  This note was prepared with assistance of Dragon voice recognition software. Occasional wrong-word or sound-a-like substitutions may have occurred due to the inherent limitations of voice recognition software.     Davione Lenker, Gwenyth Allegra, MD 11/25/17 [phone]

## 2017-11-24 NOTE — ED Notes (Signed)
Patient ambulated into hallway and to restroom using a walker and 2 person assist. Patient had an episode of incontinence in brief and attempted to provide specimen but was unsuccessful. Patient cleaned and placed back into bed on purwick. Aware of need for sample.

## 2017-11-24 NOTE — ED Provider Notes (Signed)
[facility] EMERGENCY DEPARTMENT Provider Note   CSN: [id number] Arrival date & time: 11/24/17  1233     History   Chief Complaint Chief Complaint  Patient presents with  . Extremity Weakness    HPI Tara Dunn is a 77 y.o. female.  HPI Is somewhat nonspecific as a historian.  She reports that she has had increasing frequency of falls over about a 3-week duration.  She has had 3 falls altogether.  Most recent fall was this morning trying to get out of bed.  She landed on her left hip and has some left hip pain.  She thinks the falls occurred due to weakness in her legs.  She reports one time she was trying to bend over and fell.  One time she fell trying to get out of bed.  She does not localize weakness from left to right.  Patient has history of 3 prior CVAs.  She has some right-sided deficit at baseline.  Patient denies recent fever chills or general illness.  No pain or burning with urination.  No chest pain.  She reports some amount of occasional cough.  No fever.  No abdominal pain. Past Medical History:  Diagnosis Date  . 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 Lovena Le  . Aortic dilatation ([place])    a. 04/2014 CTA chest w/ incidental finding of distal thoracic Ao enlargement of 3.39 mm - f/u needed 04/2015.  . Arthritis    "all over my body"  . Cancer of left breast ([place]) 2009   s/p L mastectomy and chemo.  . Cardiomyopathy ([place])    a. 04/2014 Echo: EF 25-30%, mod conc LVH, possibl antsept HK, Gr1 DD, mod-sev dil LA.  . CHF (congestive heart failure) ([place])   . CKD (chronic kidney disease), stage III ([place])   . Depression   . GERD (gastroesophageal reflux disease)   . Gout   . Heart murmur   . History of stomach ulcers   . Hypertension    a. Dx @ age 89;  b. 04/2014 admission for HTN emergency.  . Obesity (BMI 30-39.9) 01/21/2015  . OSA on CPAP   . Stroke [facility])    a. 3 strokes - last in 1998; "memory problems since"   (03/26/2015)    Patient Active Problem List   Diagnosis Date Noted  . Labile blood pressure   . Hypoalbuminemia due to protein-calorie malnutrition ([place])   . Acute blood loss anemia   . AKI (acute kidney injury) ([place])   . Peripheral edema   . Stage 3 chronic kidney disease ([place])   . Benign essential HTN   . History of CVA with residual deficit   . Cerebrovascular accident (CVA) due to thrombosis of left middle cerebral artery ([place])   . Right leg weakness 04/19/2017  . Right hemiparesis ([place]) 04/19/2017  . 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 Joint pain 12/09/2015  . ICD (implantable cardioverter-defibrillator) in place 03/26/2015  . Syncope 03/06/2015  . Obesity (BMI 30-39.9) 01/21/2015  . Excessive daytime sleepiness 08/04/2014  . OSA (obstructive sleep apnea) 08/04/2014  . Essential hypertension 06/13/2014  . Chronic systolic CHF (congestive heart failure) ([place]) 05/20/2014  . Congestive heart disease ([place])   . Chronic combined systolic and diastolic CHF (congestive heart failure) ([place]) 05/03/2014  . CKD (chronic kidney disease), stage III ([place]) 05/03/2014  . Hx of stroke without residual deficits 05/03/2014    Past Surgical History:  Procedure Laterality Date  . ABDOMINAL  HYSTERECTOMY    . BREAST BIOPSY Left 2008  . CATARACT EXTRACTION, BILATERAL Bilateral   . DILATION AND CURETTAGE OF UTERUS    . EP IMPLANTABLE DEVICE N/A 03/26/2015   MDT single chamber ICD, Dr. Lovena Le  . LEFT HEART CATHETERIZATION WITH CORONARY ANGIOGRAM N/A 05/07/2014   Procedure: LEFT HEART CATHETERIZATION WITH CORONARY ANGIOGRAM;  Surgeon: Belva Crome, MD;  [facility] LAB;  Service: Cardiovascular;  Laterality: N/A;  . MASTECTOMY Left 2009     OB History   None      Home Medications    Prior to Admission medications   Medication Sig Start Date End Date Taking? Authorizing Provider  allopurinol (ZYLOPRIM) 100 MG tablet Take 2 tablets (200 mg total) by mouth daily. 05/11/17    Angiulli, Lavon Paganini, PA-C  atorvastatin (LIPITOR) 40 MG tablet Take 80 mg by mouth daily.     [provider]  Calcium Carbonate Antacid (ALKA-SELTZER ANTACID PO) Take by mouth as needed.    [provider]  carvedilol (COREG) 25 MG tablet TAKE 1 TABLET(25 MG) BY MOUTH TWICE DAILY 10/07/17   Larey Dresser, MD  clopidogrel (PLAVIX) 75 MG tablet Take 1 tablet (75 mg total) by mouth daily. 05/11/17   Angiulli, Lavon Paganini, PA-C  colchicine 0.6 MG tablet Take 0.6 mg by mouth daily as needed (Gout flare up).    [provider]  DULoxetine (CYMBALTA) 30 MG capsule Take 30 mg by mouth daily.    [provider]  furosemide (LASIX) 20 MG tablet Take 2 tablets (40 mg total) by mouth daily. Can take extra 40 mg as needed 11/03/17   Georgiana Shore, NP  isosorbide-hydrALAZINE (BIDIL) 20-37.5 MG tablet Take 2 tablets by mouth 2 (two) times daily.    [provider]  magnesium oxide (MAG-OX) 400 MG tablet Take 1 tablet (400 mg total) by mouth 2 (two) times daily. 11/03/17   Georgiana Shore, NP  omeprazole (PRILOSEC) 20 MG capsule Take 20 mg by mouth daily. Can also take two times daily as needed    [provider]  potassium chloride SA (K-DUR,KLOR-CON) 20 MEQ tablet Take 20 mEq by mouth daily.    [provider]    Family History Family History  Problem Relation Age of Onset  . High blood pressure Mother        Died @ 46.  . High blood pressure Father   . Heart attack Father        Died in his early 68's.  . High blood pressure Sister   . Cancer Sister   . Cancer Daughter   . Heart disease Daughter     Social History Social History   Tobacco Use  . Smoking status: Former Smoker    Packs/day: 0.50    Years: 40.00    Pack years: 20.00    Types: Cigarettes  . Smokeless tobacco: Never Used  . Tobacco comment: Quit in 2009.  Substance Use Topics  . Alcohol use: No    Alcohol/week: 0.0 standard drinks    Comment: "drank occasionally when  I was young; last drink was in the late 1990s"  . Drug use: No     Allergies   Shellfish allergy   Review of Systems Review of Systems 10 Systems reviewed and are negative for acute change except as noted in the HPI.   Physical Exam Updated Vital Signs BP (!) 146/86   Pulse (!) 55   Temp 97.8 F (36.6 C) (  Oral)   Resp 19   SpO2 95%   Physical Exam  Constitutional: She is oriented to person, place, and time.  Patient is alert and nontoxic.  No respiratory distress at rest.  Central obesity.  HENT:  Head: Normocephalic and atraumatic.  Mouth/Throat: Oropharynx is clear and moist.  Eyes: Pupils are equal, round, and reactive to light. EOM are normal.  Cardiovascular: Normal rate, regular rhythm, normal heart sounds and intact distal pulses.  Pulmonary/Chest: Effort normal and breath sounds normal.  Abdominal: Soft. She exhibits no distension. There is no tenderness. There is no guarding.  Musculoskeletal:  No peripheral edema.  Calves are soft nontender.  Neurological: She is alert and oriented to person, place, and time. No cranial nerve deficit.  Cranial nerves intact.  Patient can perform grip strength symmetrically bilaterally 5\5.  No pronator drift.  Patient can pick each lower extremity off off the bed but it is difficult for her.  Slightly more on the right than left.  She can temporarily resist downward pressure.  Sensation intact to light touch x4.  Skin: Skin is warm and dry.  Psychiatric: She has a normal mood and affect.     ED Treatments / Results  Labs (all labs ordered are listed, but only abnormal results are displayed) Labs Reviewed  CBC - Abnormal; Notable for the following components:      Result Value   Hemoglobin 11.4 (*)    MCH 25.8 (*)    MCHC 29.9 (*)    RDW 18.4 (*)    All other components within normal limits  COMPREHENSIVE METABOLIC PANEL - Abnormal; Notable for the following components:   Glucose, Bld 131 (*)    Creatinine, Ser 1.53 (*)     Total Protein 6.2 (*)    GFR calc non Af Amer 32 (*)    GFR calc Af Amer 37 (*)    All other components within normal limits  I-STAT CHEM 8, ED - Abnormal; Notable for the following components:   BUN 26 (*)    Creatinine, Ser 1.50 (*)    Glucose, Bld 126 (*)    All other components within normal limits  URINE CULTURE  PROTIME-INR  APTT  DIFFERENTIAL  URINALYSIS, ROUTINE W REFLEX MICROSCOPIC  I-STAT TROPONIN, ED  CBG MONITORING, ED    EKG EKG Interpretation  Date/Time:  Thursday November 24 2017 12:52:17 EDT Ventricular Rate:  81 PR Interval:  198 QRS Duration: 132 QT Interval:  424 QTC Calculation: 492 R Axis:   -53 Text Interpretation:  Sinus rhythm with frequent Premature ventricular complexes and Premature atrial complexes Left axis deviation Left ventricular hypertrophy with QRS widening Cannot rule out Septal infarct , age undetermined Abnormal ECG similar to previous with PVCs Confirmed by Charlesetta Shanks [phone]) on 11/24/2017 2:47:12 PM   Radiology Ct Head Wo Contrast  Result Date: 11/24/2017 CLINICAL DATA:  Falls and extremity weakness. History of prior cerebral infarction. EXAM: CT HEAD WITHOUT CONTRAST TECHNIQUE: Contiguous axial images were obtained from the base of the skull through the vertex without intravenous contrast. COMPARISON:  Prior CT of the head on 04/18/2017. MRI of the head on 04/19/2017. FINDINGS: Brain: Stable small vessel ischemic disease in the periventricular white matter and old lacunar infarcts of the left internal capsule and bilateral basal ganglia. The brain demonstrates no evidence of hemorrhage, acute infarction, edema, mass effect, extra-axial fluid collection, hydrocephalus or mass lesion. Vascular: No hyperdense vessel or unexpected calcification. Skull: Normal. Negative for fracture or focal lesion. Sinuses/Orbits:  No acute finding. Other: None. IMPRESSION: Stable small vessel disease and old lacunar infarcts. Electronically Signed   By:  Aletta Edouard M.D.   On: 11/24/2017 13:47    Procedures Procedures (including critical care time)  Medications Ordered in ED Medications - No data to display   Initial Impression / Assessment and Plan / ED Course  I have reviewed the triage vital signs and the nursing notes.  Pertinent labs & imaging results that were available during my care of the patient were reviewed by me and considered in my medical decision making (see chart for details).    Patient is deconditioned and although pleasant and cooperative, she is a difficult historian in terms of details and recall for timing of events.  She has had prior strokes as well likely contributing to gait dysfunction.  At this time, CT identifies older infarct areas without immediate acute infarct identified.  Patient's x-ray of the hip is pending.  Once fracture is ruled out, plan will be to attempt ambulation of the patient to determine what her gait deficit and baseline function are.  At that time, determination will need to be made as to whether or not patient should have further diagnostic imaging and final disposition.  Dr. Sherry Ruffing to assume care for the patient.  Final Clinical Impressions(s) / ED Diagnoses   Final diagnoses:  Frequent falls  Left hip pain  Severe comorbid illness    ED Discharge Orders    None       Charlesetta Shanks, MD 11/24/17 1556

## 2017-11-24 NOTE — ED Triage Notes (Signed)
Patient to ED c/o recent falls and increased extremity weakness "for a while." Patient ambulates with walker but reports increase in leg weakness, states that her legs feel heavy, especially her left leg. She fell onto her L side this morning and endorses L upper leg pain as well. Denies hitting head, no blood thinners. HX 3 CVAs, R-sided deficits (drift in R arm at baseline). Drift in left leg noted as well, but has been going on for a while. PERRL, A&Ox4.

## 2017-11-25 ENCOUNTER — Observation Stay (HOSPITAL_COMMUNITY): Payer: PPO

## 2017-11-25 ENCOUNTER — Other Ambulatory Visit: Payer: Self-pay

## 2017-11-25 DIAGNOSIS — I639 Cerebral infarction, unspecified: Secondary | ICD-10-CM | POA: Diagnosis not present

## 2017-11-25 DIAGNOSIS — I6522 Occlusion and stenosis of left carotid artery: Secondary | ICD-10-CM | POA: Diagnosis not present

## 2017-11-25 DIAGNOSIS — R296 Repeated falls: Secondary | ICD-10-CM

## 2017-11-25 DIAGNOSIS — R29898 Other symptoms and signs involving the musculoskeletal system: Secondary | ICD-10-CM

## 2017-11-25 DIAGNOSIS — R531 Weakness: Secondary | ICD-10-CM | POA: Diagnosis not present

## 2017-11-25 DIAGNOSIS — I671 Cerebral aneurysm, nonruptured: Secondary | ICD-10-CM | POA: Diagnosis not present

## 2017-11-25 LAB — CBC
HCT: 42.4 % (ref 36.0–46.0)
Hemoglobin: 13.2 g/dL (ref 12.0–15.0)
MCH: 26.5 pg (ref 26.0–34.0)
MCHC: 31.1 g/dL (ref 30.0–36.0)
MCV: 85 fL (ref 80.0–100.0)
Platelets: 233 10*3/uL (ref 150–400)
RBC: 4.99 MIL/uL (ref 3.87–5.11)
RDW: 18.5 % — ABNORMAL HIGH (ref 11.5–15.5)
WBC: 5 10*3/uL (ref 4.0–10.5)
nRBC: 0 % (ref 0.0–0.2)

## 2017-11-25 LAB — CREATININE, SERUM
Creatinine, Ser: 1.36 mg/dL — ABNORMAL HIGH (ref 0.44–1.00)
GFR calc Af Amer: 42 mL/min — ABNORMAL LOW (ref >60)
GFR calc non Af Amer: 36 mL/min — ABNORMAL LOW (ref >60)

## 2017-11-25 LAB — LIPID PANEL
Cholesterol: 138 mg/dL (ref 0–200)
HDL: 51 mg/dL (ref >40)
LDL Cholesterol: 75 mg/dL (ref 0–99)
Total CHOL/HDL Ratio: 2.7 RATIO
Triglycerides: 62 mg/dL (ref <150)
VLDL: 12 mg/dL (ref 0–40)

## 2017-11-25 LAB — MAGNESIUM: Magnesium: 1.8 mg/dL (ref 1.7–2.4)

## 2017-11-25 LAB — PHOSPHORUS: Phosphorus: 3.2 mg/dL (ref 2.5–4.6)

## 2017-11-25 LAB — HEMOGLOBIN A1C
Hgb A1c MFr Bld: 5.9 % — ABNORMAL HIGH (ref 4.8–5.6)
Mean Plasma Glucose: 122.63 mg/dL

## 2017-11-25 MED ORDER — ALLOPURINOL 100 MG PO TABS
200.0000 mg | ORAL_TABLET | Freq: Every day | ORAL | Status: DC
  Administered 2017-11-25 – 2017-11-30 (×6): 200 mg via ORAL
  Filled 2017-11-25 (×6): qty 2

## 2017-11-25 MED ORDER — HEPARIN SODIUM (PORCINE) 5000 UNIT/ML IJ SOLN
5000.0000 [IU] | Freq: Three times a day (TID) | INTRAMUSCULAR | Status: DC
  Administered 2017-11-25 – 2017-11-30 (×15): 5000 [IU] via SUBCUTANEOUS
  Filled 2017-11-25 (×14): qty 1

## 2017-11-25 MED ORDER — SODIUM CHLORIDE 0.9 % IV SOLN
1.0000 g | INTRAVENOUS | Status: DC
  Administered 2017-11-25 – 2017-11-26 (×2): 1 g via INTRAVENOUS
  Filled 2017-11-25: qty 10
  Filled 2017-11-25: qty 1

## 2017-11-25 MED ORDER — ISOSORB DINITRATE-HYDRALAZINE 20-37.5 MG PO TABS
2.0000 | ORAL_TABLET | Freq: Two times a day (BID) | ORAL | Status: DC
  Administered 2017-11-25 – 2017-11-30 (×11): 2 via ORAL
  Filled 2017-11-25 (×11): qty 2

## 2017-11-25 MED ORDER — ATORVASTATIN CALCIUM 80 MG PO TABS
80.0000 mg | ORAL_TABLET | Freq: Every day | ORAL | Status: DC
  Administered 2017-11-25 – 2017-11-30 (×6): 80 mg via ORAL
  Filled 2017-11-25 (×6): qty 1

## 2017-11-25 MED ORDER — COLCHICINE 0.6 MG PO TABS: 0.6000 mg | ORAL_TABLET | Freq: Every day | ORAL | Status: DC | PRN

## 2017-11-25 MED ORDER — CARVEDILOL 12.5 MG PO TABS
25.0000 mg | ORAL_TABLET | Freq: Two times a day (BID) | ORAL | Status: DC
  Administered 2017-11-25 – 2017-11-30 (×10): 25 mg via ORAL
  Filled 2017-11-25 (×10): qty 2

## 2017-11-25 MED ORDER — CLOPIDOGREL BISULFATE 75 MG PO TABS
75.0000 mg | ORAL_TABLET | Freq: Every day | ORAL | Status: DC
  Administered 2017-11-25 – 2017-11-30 (×6): 75 mg via ORAL
  Filled 2017-11-25 (×6): qty 1

## 2017-11-25 NOTE — Consult Note (Signed)
   [facility] CM Inpatient Consult   11/25/2017  [place]iana Amaro 1940-05-23 [id number]  Notified of member in [place] with new referral from [facility]. Chart reviewed and MD notes are as follows:  Patient is a 77 year old African-American female, with past medical history significant for recurrent CVA (at least on 3 prior occasions) with right-sided hemiparesis, hypertension, hyperlipidemia, obesity, OSA on CPAP, GERD, chronic kidney disease stage III, GERD, aortic dilatation, combined systolic and diastolic congestive heart failure with EF of 45 to 50% and grade 1 diastolic dysfunction, and status post AICD placement.  Patient was seen alongside patient's daughter.  Patient and patient's daughter reported that patient has had left lower extremity weakness for greater than 2 weeks.  The weakness affects patient's ability to ambulate, leading to multiple falls.  There is history of recent bladder incontinence, but no fecal incontinence.  No back pain.  CT scan of the head has not shown any acute stroke.  MRI of the brain, MRA head and MRA neck also negative for any acute process.  Patient was admitted to rule out possible CVA.   Will follow for progress, disposition needs.  For questions or referrals, please contact:  Natividad Brood, RN BSN [facility] Liaison  [phone] business mobile phone Toll free office [phone]

## 2017-11-25 NOTE — Progress Notes (Signed)
Arrived from Ed. Alert and oriented. Denies any pain. Call light within reach. 

## 2017-11-25 NOTE — Care Management (Signed)
This is a no charge note  Pending admission per Dr. Gustavus Messing  77 year old lady with past medical history of stroke with right-sided weakness, hypertension, hyperlipidemia, GERD, gout, CKD-3, aortic dilation, CHF, obesity, OSA on CPAP, who presents with fall and new left leg weakness in addition to right-sided weakness from a previous stroke.   CT head showed old lacunar infarct, but no acute intracranial abnormalities.  Patient also has questionable UTI, started Rocephin by ED physician.  Plan is to get an MRI of the brain to rule out new stroke, but the patient has ICD. Per EDP, MRI technician is aware of the presence of ICD-->will likely do MRI in AM with ICD technician's help.  Patient is placed on telemetry bed of observation.   Ivor Costa, MD  [facility] Pager [phone]  If 7PM-7AM, please contact night-coverage [email or web address] Password [email or web address] 11/25/2017, 2:53 AM

## 2017-11-25 NOTE — H&P (Addendum)
History and Physical  Tara Dunn LEX:[id number] DOB: 12/22/1940 DOA: 11/24/2017  Referring physician: ER physician PCP: Fanny Bien, MD  Outpatient Specialists:    Patient coming from: Home  Chief Complaint: Left lower extremity weakness for greater than 2 weeks.  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  Patient was seen alongside patient's daughter.  Patient and patient's daughter reported that patient has had left lower extremity weakness for greater than 2 weeks.  The weakness affects patient's ability to ambulate, leading to multiple falls.  There is history of recent bladder incontinence, but no fecal incontinence.  No back pain.  CT scan of the head has not shown any acute stroke.  MRI of the brain, MRA head and MRA neck also negative for any acute process.  Patient was admitted to rule out possible CVA.  UA is also suggestive of likely UTI.  No headache, no neck pain, no chest pain, no shortness of breath, no fever chills, no URI symptoms, no sore throat, no GI symptoms.  Patient be admitted for further assessment and management.  ED Course: Work-up for possible CVA was initiated at the emergency room.  CT head is as documented above.  Pertinent labs: Sodium is 137, potassium of 4.1, chloride 101, BUN of 26, creatinine of 1.5 (stable), blood sugar of 126.  Troponin is 0.02.  CBC reveals WBC of 4.7, hemoglobin of 12.6, hematocrit of 37, MCV of 86.2 platelet count of 234.  UA reveals positive nitrites.  CT head without contrast reveals "Stable small vessel disease and old lacunar infarcts".   X-ray of the left femur and hip did not reveal any acute abnormality.  Echocardiogram done in March 2018 revealed moderate left ventricular hypertrophy, EF of 45 to 44%, grade 1 diastolic dysfunction.  EKG: Independently reviewed.   Imaging: independently reviewed.   Review of Systems:  Negative for fever, visual changes, sore throat, rash, new muscle aches, chest pain, SOB, dysuria, bleeding, n/v/abdominal pain.  Past Medical History:  Diagnosis Date  . 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 Lovena Le  . Aortic dilatation ([place])    a. 04/2014 CTA chest w/ incidental finding of distal thoracic Ao enlargement of 3.39 mm - f/u needed 04/2015.  . Arthritis    "all over my body"  . Cancer of left breast ([place]) 2009   s/p L mastectomy and chemo.  . Cardiomyopathy ([place])    a. 04/2014 Echo: EF 25-30%, mod conc LVH, possibl antsept HK, Gr1 DD, mod-sev dil LA.  . CHF (congestive heart failure) ([place])   . CKD (chronic kidney disease), stage III ([place])   . Depression   . GERD (gastroesophageal reflux disease)   . Gout   . Heart murmur   . History of stomach ulcers   . Hypertension    a. Dx @ age 49;  b. 04/2014 admission for HTN emergency.  . Obesity (BMI 30-39.9) 01/21/2015  . OSA on CPAP   . Stroke [facility])    a. 3 strokes - last in 1998; "memory problems since"  (03/26/2015)    Past Surgical History:  Procedure Laterality Date  . ABDOMINAL HYSTERECTOMY    . BREAST BIOPSY  Left 2008  . CATARACT EXTRACTION, BILATERAL Bilateral   . DILATION AND CURETTAGE OF UTERUS    . EP IMPLANTABLE DEVICE N/A 03/26/2015   MDT single chamber ICD, Dr. Lovena Le  . LEFT HEART CATHETERIZATION WITH CORONARY ANGIOGRAM N/A 05/07/2014   Procedure: LEFT HEART CATHETERIZATION WITH CORONARY ANGIOGRAM;  Surgeon: Belva Crome, MD;  Location: [facility] CATH LAB;  Service: Cardiovascular;  Laterality: N/A;  . MASTECTOMY Left 2009     reports that she has quit smoking. Her smoking use included  cigarettes. She has a 20.00 pack-year smoking history. She has never used smokeless tobacco. She reports that she does not drink alcohol or use drugs.  Allergies  Allergen Reactions  . Shellfish Allergy Hives    Family History  Problem Relation Age of Onset  . High blood pressure Mother        Died @ 23.  . High blood pressure Father   . Heart attack Father        Died in his early 56's.  . High blood pressure Sister   . Cancer Sister   . Cancer Daughter   . Heart disease Daughter      Prior to Admission medications   Medication Sig Start Date End Date Taking? Authorizing Provider  allopurinol (ZYLOPRIM) 100 MG tablet Take 2 tablets (200 mg total) by mouth daily. 05/11/17  Yes Angiulli, Lavon Paganini, PA-C  atorvastatin (LIPITOR) 40 MG tablet Take 80 mg by mouth daily.    Yes [provider]  carvedilol (COREG) 25 MG tablet TAKE 1 TABLET(25 MG) BY MOUTH TWICE DAILY Patient taking differently: Take 25 mg by mouth 2 (two) times daily with a meal.  10/07/17  Yes Larey Dresser, MD  clopidogrel (PLAVIX) 75 MG tablet Take 1 tablet (75 mg total) by mouth daily. 05/11/17  Yes Angiulli, Lavon Paganini, PA-C  DULoxetine (CYMBALTA) 30 MG capsule Take 30 mg by mouth daily.   Yes [provider]  furosemide (LASIX) 20 MG tablet Take 2 tablets (40 mg total) by mouth daily. Can take extra 40 mg as needed 11/03/17  Yes Georgiana Shore, NP  isosorbide-hydrALAZINE (BIDIL) 20-37.5 MG tablet Take 2 tablets by mouth 2 (two) times daily.   Yes [provider]  magnesium oxide (MAG-OX) 400 MG tablet Take 1 tablet (400 mg total) by mouth 2 (two) times daily. Patient taking differently: Take 400 mg by mouth daily.  11/03/17  Yes Georgiana Shore, NP  omeprazole (PRILOSEC) 20 MG capsule Take 20 mg by mouth daily. Can also take two times daily as needed   Yes [provider]  potassium chloride SA (K-DUR,KLOR-CON) 20 MEQ tablet Take 20 mEq by mouth daily.   Yes [provider]    Calcium Carbonate Antacid (ALKA-SELTZER ANTACID PO) Take by mouth as needed.    [provider]  colchicine 0.6 MG tablet Take 0.6 mg by mouth daily as needed (Gout flare up).    [provider]    Physical Exam: Vitals:   11/25/17 0100 11/25/17 0130 11/25/17 0300 11/25/17 0817  BP: (!) 164/99  138/67 (!) 162/73  Pulse: 68  90 75  Resp: 18  18 20   Temp:  98.4 F (36.9 C) 98.6 F (37 C) 98.4 F (36.9 C)  TempSrc:  Oral Oral Oral  SpO2: 96%  97% 96%   Constitutional:  . Appears calm and comfortable.  Expressive dysarthria noted. Eyes:  Marland Kitchen Mild pallor. No jaundice.  ENMT:  . external ears, nose appear  normal Neck:  . Neck is supple. No JVD Respiratory:  . CTA bilaterally, no w/r/r.  . Respiratory effort normal. No retractions or accessory muscle use Cardiovascular:  . S1S2 . No LE extremity edema   Abdomen:  . Abdomen is soft and non tender. Organs are difficult to assess. Neurologic:  . Awake and alert. . Right-sided weakness. . Left lower extremity weakness noted, but better than the right lower extremity.  Wt Readings from Last 3 Encounters:  11/03/17 88.4 kg  10/24/17 88.2 kg  10/20/17 88.1 kg    I have personally reviewed following labs and imaging studies  Labs on Admission:  CBC: Recent Labs  Lab 11/24/17 1259 11/24/17 1311  WBC 4.7  --   NEUTROABS 3.2  --   HGB 11.4* 12.6  HCT 38.1 37.0  MCV 86.2  --   PLT 234  --    Basic Metabolic Panel: Recent Labs  Lab 11/23/17 1033 11/24/17 1259 11/24/17 1311  NA  --  137 137  K  --  4.1 4.1  CL  --  103 101  CO2  --  26  --   GLUCOSE  --  131* 126*  BUN  --  23 26*  CREATININE  --  1.53* 1.50*  CALCIUM  --  10.2  --   MG 1.8  --   --    Liver Function Tests: Recent Labs  Lab 11/24/17 1259  AST 17  ALT 14  ALKPHOS 98  BILITOT 0.6  PROT 6.2*  ALBUMIN 3.5   No results for input(s): LIPASE, AMYLASE in the last 168 hours. No results for input(s): AMMONIA in the last 168  hours. Coagulation Profile: Recent Labs  Lab 11/24/17 1259  INR 1.06   Cardiac Enzymes: No results for input(s): CKTOTAL, CKMB, CKMBINDEX, TROPONINI in the last 168 hours. BNP (last 3 results) No results for input(s): PROBNP in the last 8760 hours. HbA1C: No results for input(s): HGBA1C in the last 72 hours. CBG: Recent Labs  Lab 11/24/17 1430  GLUCAP 95   Lipid Profile: No results for input(s): CHOL, HDL, LDLCALC, TRIG, CHOLHDL, LDLDIRECT in the last 72 hours. Thyroid Function Tests: No results for input(s): TSH, T4TOTAL, FREET4, T3FREE, THYROIDAB in the last 72 hours. Anemia Panel: No results for input(s): VITAMINB12, FOLATE, FERRITIN, TIBC, IRON, RETICCTPCT in the last 72 hours. Urine analysis:    Component Value Date/Time   COLORURINE YELLOW 11/24/2017 1830   APPEARANCEUR CLEAR 11/24/2017 1830   LABSPEC 1.014 11/24/2017 1830   PHURINE 6.0 11/24/2017 1830   GLUCOSEU NEGATIVE 11/24/2017 1830   HGBUR NEGATIVE 11/24/2017 1830   BILIRUBINUR NEGATIVE 11/24/2017 1830   [place] 11/24/2017 1830   PROTEINUR NEGATIVE 11/24/2017 1830   NITRITE POSITIVE (A) 11/24/2017 1830   LEUKOCYTESUR NEGATIVE 11/24/2017 1830   Sepsis Labs: @LABRCNTIP (procalcitonin:4,lacticidven:4) )No results found for this or any previous visit (from the past 240 hour(s)).    Radiological Exams on Admission: Ct Head Wo Contrast  Result Date: 11/24/2017 CLINICAL DATA:  Falls and extremity weakness. History of prior cerebral infarction. EXAM: CT HEAD WITHOUT CONTRAST TECHNIQUE: Contiguous axial images were obtained from the base of the skull through the vertex without intravenous contrast. COMPARISON:  Prior CT of the head on 04/18/2017. MRI of the head on 04/19/2017. FINDINGS: Brain: Stable small vessel ischemic disease in the periventricular white matter and old lacunar infarcts of the left internal capsule and bilateral basal ganglia. The brain demonstrates no evidence of hemorrhage, acute  infarction, edema, mass  effect, extra-axial fluid collection, hydrocephalus or mass lesion. Vascular: No hyperdense vessel or unexpected calcification. Skull: Normal. Negative for fracture or focal lesion. Sinuses/Orbits: No acute finding. Other: None. IMPRESSION: Stable small vessel disease and old lacunar infarcts. Electronically Signed   By: Aletta Edouard M.D.   On: 11/24/2017 13:47   Dg Hip Unilat With Pelvis 2-3 Views Left  Result Date: 11/24/2017 CLINICAL DATA:  [place] today.  Left-sided hip pain. EXAM: DG HIP (WITH OR WITHOUT PELVIS) 2-3V LEFT COMPARISON:  None. FINDINGS: No evidence of acute fracture. There are chronic degenerative or post traumatic findings of the symphysis pubis region. No evidence of hip or proximal femur fracture. IMPRESSION: No acute finding. Electronically Signed   By: Nelson Chimes M.D.   On: 11/24/2017 16:05   Dg Femur 1v Left  Result Date: 11/24/2017 CLINICAL DATA:  [place].  Left femur pain. EXAM: LEFT FEMUR 1 VIEW COMPARISON:  Earlier same day FINDINGS: No evidence of fracture.  Chronic osteoarthritis of the knee. IMPRESSION: Negative. Electronically Signed   By: Nelson Chimes M.D.   On: 11/24/2017 16:06    EKG: Independently reviewed.   Active Problems:   Left leg weakness   Assessment/Plan Concerns for recurrent CVA: CT head without contrast is negative for acute CVA. MRI brain, MRA head and neck also negative for acute process. We will consult physical and occupational therapy for the left lower extremity weakness  Will have a low threshold to consult neurology to determine need for lumbosacral imaging.  Left lower extremity weakness: Etiology remains uncertain Patient has had recurrent falls Patient has recent urinary incontinence, but no fecal Physical and Occupational Therapy as above Adequate pain control Low threshold for lumbosacral imaging studies, as well as neurology input Disposition with dependent therapy  assessment.  Hypertension: Continue to optimize.  Chronic kidney disease stage III: Stable.  OSA on CPAP: Continue CPAP during the hospital stay.  Hyperlipidemia: Continue Lipitor 80 mg p.o. once daily.  UTI: Follow urine culture IV Rocephin.  Further management will depend on hospital course.  DVT prophylaxis: Subcu heparin Code Status: Full code Family Communication: Daughter Disposition Plan: Will depend on hospital course Consults called: Please have low threshold to consult neurology. Admission status: Observation  Time spent: 65 minutes.   Dana Allan, MD  [facility] Pager #: [phone] 7PM-7AM contact night coverage as above  11/25/2017, 9:02 AM

## 2017-11-25 NOTE — Evaluation (Signed)
Physical Therapy Evaluation Patient Details Name: Tara Dunn MRN: [id number] DOB: October 21, 1940 Today's Date: 11/25/2017   History of Present Illness  Pt is a 77 y/o female admitted secondary to multiple falls at home and LLE weakness. MRI negative for acute abnormality, however, did show chronic lesions from previous CVA. Pt also with questionable UTI. PMH includes CVA with R sided weakness, CHF, OSA on CPAP, HTN, gout, and CKD.   Clinical Impression  Pt admitted secondary to problem above with deficits below. Mobility limited to chair this session secondary to fatigue and weakness. Required min to mod A for mobility to chair using RW. Pt reports multiple falls within the past few weeks. Feel pt is at increased risk for falls and will require increased assist at d/c. Will continue to follow acutely to maximize functional mobility independence and safety.     Follow Up Recommendations SNF;Supervision/Assistance - 24 hour    Equipment Recommendations  None recommended by PT    Recommendations for Other Services OT consult     Precautions / Restrictions Precautions Precautions: Fall Precaution Comments: Pt reporting multiple falls within the past few weeks.  Restrictions Weight Bearing Restrictions: No      Mobility  Bed Mobility Overal bed mobility: Needs Assistance Bed Mobility: Supine to Sit     Supine to sit: Mod assist     General bed mobility comments: Mod A for assist with balance and scooting hips to EOB.   Transfers Overall transfer level: Needs assistance Equipment used: Rolling walker (2 wheeled) Transfers: Sit to/from [facility] Sit to Stand: Mod assist;Min assist Stand pivot transfers: Min assist       General transfer comment: Pt requiring mod A initially to stand safely, for lift assist and steadying. Pt requesting to attempt standing by herself. Pt required multiple attempts, increased time, and min A for standing assist. Min A to  perform stand pivot to chair using RW secondary to unsteadiness. Pt very fatigued, so further mobility deferred.   Ambulation/Gait                Stairs            Wheelchair Mobility    Modified Rankin (Stroke Patients Only)       Balance Overall balance assessment: Needs assistance Sitting-balance support: No upper extremity supported;Feet supported Sitting balance-Leahy Scale: Fair     Standing balance support: Bilateral upper extremity supported;During functional activity Standing balance-Leahy Scale: Poor Standing balance comment: Reliant on BUE support.                              Pertinent Vitals/Pain Pain Assessment: No/denies pain    Home Living Family/patient expects to be discharged to:: Private residence Living Arrangements: Children Available Help at Discharge: Family;Available 24 hours/day Type of Home: House Home Access: Stairs to enter   Entrance Stairs-Number of Steps: 1(threshold ) Home Layout: One level Home Equipment: Walker - 2 wheels;Cane - single point;Bedside commode;Shower seat;Hospital bed      Prior Function Level of Independence: Needs assistance   Gait / Transfers Assistance Needed: Uses RW for ambulation, however, reports she has had multiple falls.   ADL's / Homemaking Assistance Needed: Assist with bathing from daughter.        Hand Dominance   Dominant Hand: Right    Extremity/Trunk Assessment   Upper Extremity Assessment Upper Extremity Assessment: Defer to OT evaluation    Lower Extremity Assessment Lower Extremity Assessment:  RLE deficits/detail;Generalized weakness RLE Deficits / Details: Pt with RLE weakness at baseline. Able to perform partial heel slide, ankle pump, and hip abduction.     Cervical / Trunk Assessment Cervical / Trunk Assessment: Kyphotic  Communication   Communication: No difficulties  Cognition Arousal/Alertness: Awake/alert Behavior During Therapy: WFL for tasks  assessed/performed Overall Cognitive Status: No family/caregiver present to determine baseline cognitive functioning                                 General Comments: Pt reports it is 1919, however, was oriented to situation, place, and person. Pt with slowed processing as well, however, does have history of CVA.       General Comments General comments (skin integrity, edema, etc.): No family present     Exercises     Assessment/Plan    PT Assessment Patient needs continued PT services  PT Problem List Decreased strength;Decreased balance;Decreased activity tolerance;Decreased mobility;Decreased cognition;Decreased knowledge of use of DME;Decreased safety awareness;Decreased knowledge of precautions       PT Treatment Interventions DME instruction;Gait training;Functional mobility training;Therapeutic activities;Therapeutic exercise;Balance training;Patient/family education    PT Goals (Current goals can be found in the Care Plan section)  Acute Rehab PT Goals Patient Stated Goal: to stop falling PT Goal Formulation: With patient Time For Goal Achievement: 12/09/17 Potential to Achieve Goals: Fair    Frequency Min 2X/week   Barriers to discharge        Co-evaluation               AM-PAC PT "6 Clicks" Daily Activity  Outcome Measure Difficulty turning over in bed (including adjusting bedclothes, sheets and blankets)?: A Lot Difficulty moving from lying on back to sitting on the side of the bed? : Unable Difficulty sitting down on and standing up from a chair with arms (e.g., wheelchair, bedside commode, etc,.)?: Unable Help needed moving to and from a bed to chair (including a wheelchair)?: A Little Help needed walking in hospital room?: A Lot Help needed climbing 3-5 steps with a railing? : A Lot 6 Click Score: 11    End of Session Equipment Utilized During Treatment: Gait belt Activity Tolerance: Patient limited by fatigue Patient left: in  chair;with call bell/phone within reach;with chair alarm set Nurse Communication: Mobility status PT Visit Diagnosis: Unsteadiness on feet (R26.81);Muscle weakness (generalized) (M62.81);Repeated falls (R29.6);History of falling (Z91.81)    Time: 1027-2536 PT Time Calculation (min) (ACUTE ONLY): 23 min   Charges:   PT Evaluation $PT Eval Moderate Complexity: 1 Mod PT Treatments $Therapeutic Activity: 8-22 mins        Leighton Ruff, PT, DPT  Acute Rehabilitation Services  Pager: [phone] Office: [phone]   Rudean Hitt 11/25/2017, 3:34 PM

## 2017-11-25 NOTE — Patient Outreach (Signed)
[facility]) Care Management  11/25/2017  Tara Dunn 1940-03-20 [id number]   Telephone Screen  Referral Date:11/24/17  Referral Source: MD office Referral Reason: " HF, HTN, kidney disease, SW to help with ALF placement" Insurance:HTA   Referral received. Noted patient presented to the ED on 11/24/17 and has been admitted.No outreach to patient at this time.   Plan: RN CM notified [facility] hospital liaisons and requested possible follow up with patient.    Enzo Montgomery, RN,BSN,CCM [place] Management Telephonic Care Management Coordinator Direct Phone: [phone] Toll Free: [phone] Fax: [phone]

## 2017-11-26 DIAGNOSIS — R69 Illness, unspecified: Secondary | ICD-10-CM

## 2017-11-26 DIAGNOSIS — R29898 Other symptoms and signs involving the musculoskeletal system: Secondary | ICD-10-CM | POA: Diagnosis not present

## 2017-11-26 DIAGNOSIS — I10 Essential (primary) hypertension: Secondary | ICD-10-CM | POA: Diagnosis not present

## 2017-11-26 DIAGNOSIS — I639 Cerebral infarction, unspecified: Secondary | ICD-10-CM | POA: Diagnosis not present

## 2017-11-26 DIAGNOSIS — R262 Difficulty in walking, not elsewhere classified: Secondary | ICD-10-CM | POA: Diagnosis not present

## 2017-11-26 DIAGNOSIS — R296 Repeated falls: Secondary | ICD-10-CM | POA: Diagnosis not present

## 2017-11-26 LAB — COMPREHENSIVE METABOLIC PANEL
ALT: 12 U/L (ref 0–44)
AST: 15 U/L (ref 15–41)
Albumin: 3.1 g/dL — ABNORMAL LOW (ref 3.5–5.0)
Alkaline Phosphatase: 86 U/L (ref 38–126)
Anion gap: 10 (ref 5–15)
BUN: 22 mg/dL (ref 8–23)
CO2: 25 mmol/L (ref 22–32)
Calcium: 9.9 mg/dL (ref 8.9–10.3)
Chloride: 103 mmol/L (ref 98–111)
Creatinine, Ser: 1.52 mg/dL — ABNORMAL HIGH (ref 0.44–1.00)
GFR calc Af Amer: 37 mL/min — ABNORMAL LOW (ref >60)
GFR calc non Af Amer: 32 mL/min — ABNORMAL LOW (ref >60)
Glucose, Bld: 98 mg/dL (ref 70–99)
Potassium: 4.1 mmol/L (ref 3.5–5.1)
Sodium: 138 mmol/L (ref 135–145)
Total Bilirubin: 0.6 mg/dL (ref 0.3–1.2)
Total Protein: 5.9 g/dL — ABNORMAL LOW (ref 6.5–8.1)

## 2017-11-26 MED ORDER — WHITE PETROLATUM EX OINT
TOPICAL_OINTMENT | CUTANEOUS | Status: AC
  Administered 2017-11-26: 18:00:00
  Filled 2017-11-26: qty 28.35

## 2017-11-26 NOTE — Clinical Social Work Note (Signed)
Clinical Social Work Assessment  Patient Details  Name: Tara Dunn MRN: [id number] Date of Birth: [birth year]  Date of referral:  11/26/17               Reason for consult:  Facility Placement, Discharge Planning                Permission sought to share information with:  Chartered certified accountant granted to share information::  Yes, Verbal Permission Granted  Name::        Agency::  [facility]  Relationship::     Contact Information:     Housing/Transportation Living arrangements for the past 2 months:  [place] of Information:  Patient, Medical Team Patient Interpreter Needed:  None Criminal Activity/Legal Involvement Pertinent to Current Situation/Hospitalization:  No - Comment as needed Significant Relationships:  Adult Children Lives with:  Adult Children Do you feel safe going back to the place where you live?  Yes Need for family participation in patient care:  Yes (Comment)  Care giving concerns:  PT recommending SNF once medically stable for discharge.   Social Worker assessment / plan:  CSW met with patient. No supports at bedside. CSW introduced role and explained that PT recommendations would be discussed. Patient is agreeable to SNF placement and stated her daughter's first preference is [facility] in [place]. Unsure if this facility takes patient's insurance and it is over 72 mile limit so patient is aware her daughter may have to transport her there if accepted. CSW left message for local [facility] hospital liaison to see if she covers this facility as well. Attempted calling facility but no answer and unable to leave voicemail. No further concerns. CSW encouraged patient to contact CSW as needed. CSW will continue to follow patient for support and facilitate discharge to SNF once medically stable.  Employment status:  Retired Forensic scientist:  Other (Comment Required)([employer]) PT  Recommendations:  [place] / Referral to community resources:  [place]  Patient/Family's Response to care:  Patient agreeable to SNF placement. Patient's daughter supportive and involved in patient's care. Patient appreciated social work intervention.  Patient/Family's Understanding of and Emotional Response to Diagnosis, Current Treatment, and Prognosis:  Patient has a good understanding of the reason for admission and her need for rehab prior to returning home. Patient appears happy with hospital care.  Emotional Assessment Appearance:  Appears stated age Attitude/Demeanor/Rapport:  Engaged, Gracious Affect (typically observed):  Accepting, Appropriate, Calm, Pleasant Orientation:  Oriented to Self, Oriented to Place, Oriented to  Time, Oriented to Situation Alcohol / Substance use:  Never Used Psych involvement (Current and /or in the community):  No (Comment)  Discharge Needs  Concerns to be addressed:  Care Coordination Readmission within the last 30 days:  No Current discharge risk:  Dependent with Mobility Barriers to Discharge:  Continued Medical Work up, [place], LCSW 11/26/2017, 12:42 PM

## 2017-11-26 NOTE — NC FL2 (Signed)
[place] MEDICAID FL2 LEVEL OF CARE SCREENING TOOL     IDENTIFICATION  Patient Name: Tara Dunn Birthdate: April 29, 1940 Sex: female Admission Date (Current Location): 11/24/2017  [facility] and [place] Number:  Herbalist and Address:  [facility], [place] [address] [place],  [address]      Provider Number: [id number]  Attending Physician Name and Address:  Tawni Millers  Relative Name and Phone Number:       Current Level of Care: Hospital Recommended Level of Care: [place] Prior Approval Number:    Date Approved/Denied:   PASRR Number: [id number]  Discharge Plan: SNF    Current Diagnoses: Patient Active Problem List   Diagnosis Date Noted  . Acute CVA (cerebrovascular accident) ([place]) 11/25/2017  . Left leg weakness 11/24/2017  . Labile blood pressure   . Hypoalbuminemia due to protein-calorie malnutrition ([place])   . Acute blood loss anemia   . AKI (acute kidney injury) ([place])   . Peripheral edema   . Stage 3 chronic kidney disease ([place])   . Benign essential HTN   . History of CVA with residual deficit   . Cerebrovascular accident (CVA) due to thrombosis of left middle cerebral artery ([place])   . Right leg weakness 04/19/2017  . Right hemiparesis ([place]) 04/19/2017  . Thalamic infarct, acute ([place]) 04/19/2017  . Joint pain 12/09/2015  . ICD (implantable cardioverter-defibrillator) in place 03/26/2015  . Syncope 03/06/2015  . Obesity (BMI 30-39.9) 01/21/2015  . Excessive daytime sleepiness 08/04/2014  . OSA (obstructive sleep apnea) 08/04/2014  . Essential hypertension 06/13/2014  . Chronic systolic CHF (congestive heart failure) ([place]) 05/20/2014  . Congestive heart disease ([place])   . Chronic combined systolic and diastolic CHF (congestive heart failure) ([place]) 05/03/2014  . CKD (chronic kidney disease), stage III ([place]) 05/03/2014  . Hx of stroke without residual deficits 05/03/2014     Orientation RESPIRATION BLADDER Height & Weight     Self, Time, Situation, Place  Normal Continent, External catheter Weight:   Height:     BEHAVIORAL SYMPTOMS/MOOD NEUROLOGICAL BOWEL NUTRITION STATUS  (None) (Acute CVA) Continent Diet(Heart healthy)  AMBULATORY STATUS COMMUNICATION OF NEEDS Skin   Extensive Assist Verbally Normal                       Personal Care Assistance Level of Assistance  Bathing, Feeding, Dressing Bathing Assistance: Limited assistance Feeding assistance: Independent Dressing Assistance: Limited assistance     Functional Limitations Info  Sight, Hearing, Speech Sight Info: Adequate Hearing Info: Adequate Speech Info: Adequate    SPECIAL CARE FACTORS FREQUENCY  PT (By licensed PT), OT (By licensed OT)     PT Frequency: 5 x week OT Frequency: 5 x week            Contractures Contractures Info: Not present    Additional Factors Info  Code Status, Allergies Code Status Info: Full code Allergies Info: Shellfish Allergy           Current Medications (11/26/2017):  This is the current hospital active medication list Current Facility-Administered Medications  Medication Dose Route Frequency Provider Last Rate Last Dose  . acetaminophen (TYLENOL) tablet 650 mg  650 mg Oral Q6H PRN Dana Allan I, MD      . allopurinol (ZYLOPRIM) tablet 200 mg  200 mg Oral Daily Dana Allan I, MD   200 mg at 11/26/17 1012  . atorvastatin (LIPITOR) tablet 80 mg  80 mg Oral Daily  Dana Allan I, MD   80 mg at 11/26/17 1012  . carvedilol (COREG) tablet 25 mg  25 mg Oral BID WC Dana Allan I, MD   25 mg at 11/26/17 1012  . cefTRIAXone (ROCEPHIN) 1 g in sodium chloride 0.9 % 100 mL IVPB  1 g Intravenous Q24H Bonnell Public, MD   Stopped at 11/25/17 2235  . clopidogrel (PLAVIX) tablet 75 mg  75 mg Oral Daily Dana Allan I, MD   75 mg at 11/26/17 1013  . colchicine tablet 0.6 mg  0.6 mg Oral Daily PRN Dana Allan I, MD       . heparin injection 5,000 Units  5,000 Units Subcutaneous Q8H Dana Allan I, MD   5,000 Units at 11/26/17 0538  . isosorbide-hydrALAZINE (BIDIL) 20-37.5 MG per tablet 2 tablet  2 tablet Oral BID Dana Allan I, MD   2 tablet at 11/26/17 1012  . white petrolatum (VASELINE) gel           . zolpidem (AMBIEN) tablet 5 mg  5 mg Oral QHS PRN Bonnell Public, MD         Discharge Medications: Please see discharge summary for a list of discharge medications.  Relevant Imaging Results:  Relevant Lab Results:   Additional Information SS#: [id number]  Candie Chroman, LCSW

## 2017-11-26 NOTE — Evaluation (Signed)
Occupational Therapy Evaluation Patient Details Name: Tara Dunn MRN: [id number] DOB: 1940-03-25 Today's Date: 11/26/2017    History of Present Illness Pt is a 77 y/o female admitted secondary to multiple falls at home and LLE weakness. MRI negative for acute abnormality, however, did show chronic lesions from previous CVA. Pt also with questionable UTI. PMH includes CVA with R sided weakness, CHF, OSA on CPAP, HTN, gout, and CKD.    Clinical Impression   PTA patient reports needing some assist with ADLs, using RW for mobility.  Admitted for above and limited by impaired balance, generalized weakness, decreased activity tolerance, and decreased coordination.  Patient requires min assist for UB ADL, max assist for LB ADL, mod assist for simulated toilet transfers and mod assist for bed mobility. Limited mobility during session due fatigue, weakness, and "not feeling well today'.  Patient will benefit from continued OT services while admitted and after dc in order to maximize independence with ADLs and transfers in order to return to PLOF.  Will continue to follow.     Follow Up Recommendations  SNF;Supervision/Assistance - 24 hour    Equipment Recommendations  Other (comment)(TBD at next venue of care)    Recommendations for Other Services       Precautions / Restrictions Precautions Precautions: Fall Precaution Comments: Pt reporting multiple falls within the past few weeks.  Restrictions Weight Bearing Restrictions: No      Mobility Bed Mobility Overal bed mobility: Needs Assistance Bed Mobility: Sit to Supine;Supine to Sit     Supine to sit: Min guard Sit to supine: Mod assist   General bed mobility comments: mod assist to manage B UE back into bed, increased time for all bed mobility  Transfers Overall transfer level: Needs assistance Equipment used: Rolling walker (2 wheeled) Transfers: Sit to/from Stand Sit to Stand: Mod assist         General transfer  comment: mod assist to ascend from bed into standing, cueing for hand placement and safety     Balance Overall balance assessment: Needs assistance Sitting-balance support: No upper extremity supported;Feet supported Sitting balance-Leahy Scale: Fair     Standing balance support: Bilateral upper extremity supported;During functional activity Standing balance-Leahy Scale: Poor Standing balance comment: Reliant on BUE support.                            ADL either performed or assessed with clinical judgement   ADL Overall ADL's : Needs assistance/impaired     Grooming: Set up;Sitting   Upper Body Bathing: Set up;Sitting   Lower Body Bathing: Maximal assistance;Sit to/from stand   Upper Body Dressing : Minimal assistance;Sitting   Lower Body Dressing: Maximal assistance;Sit to/from stand   Toilet Transfer: Moderate assistance;Stand-pivot(simulated in room ) Toilet Transfer Details (indicate cue type and reason): physical assist to ascend, cueing for hand placement and safety Toileting- Clothing Manipulation and Hygiene: Moderate assistance;Sit to/from stand       Functional mobility during ADLs: Moderate assistance;Rolling walker General ADL Comments: pt presents with generalized weakness and decreased activity tolerance     Vision   Vision Assessment?: Yes Eye Alignment: Within Functional Limits Ocular Range of Motion: Within Functional Limits Alignment/Gaze Preference: Within Defined Limits Tracking/Visual Pursuits: Able to track stimulus in all quads without difficulty     Perception     Praxis      Pertinent Vitals/Pain Pain Assessment: No/denies pain     Hand Dominance Right   Extremity/Trunk Assessment  Upper Extremity Assessment Upper Extremity Assessment: Generalized weakness   Lower Extremity Assessment Lower Extremity Assessment: Defer to PT evaluation   Cervical / Trunk Assessment Cervical / Trunk Assessment: Kyphotic    Communication Communication Communication: No difficulties   Cognition Arousal/Alertness: Awake/alert Behavior During Therapy: WFL for tasks assessed/performed Overall Cognitive Status: Within Functional Limits for tasks assessed                                     General Comments  VSS    Exercises     Shoulder Instructions      Home Living Family/patient expects to be discharged to:: Private residence Living Arrangements: Children Available Help at Discharge: Family;Available 24 hours/day Type of Home: House Home Access: Stairs to enter [employer] of Steps: 1   Home Layout: One level     Bathroom Shower/Tub: Occupational psychologist: Standard     Home Equipment: Environmental consultant - 2 wheels;Cane - single point;Bedside commode;Shower seat;Hospital bed          Prior Functioning/Environment Level of Independence: Needs assistance  Gait / Transfers Assistance Needed: Uses RW for ambulation, however, reports she has had multiple falls.  ADL's / Homemaking Assistance Needed: pts reports needing assist for bathing and dressing, unable to reach feet             OT Problem List: Decreased strength;Decreased activity tolerance;Impaired balance (sitting and/or standing);Decreased coordination;Decreased safety awareness;Decreased knowledge of use of DME or AE      OT Treatment/Interventions: Self-care/ADL training;Therapeutic exercise;Energy conservation;DME and/or AE instruction;Therapeutic activities;Patient/family education;Balance training    OT Goals(Current goals can be found in the care plan section) Acute Rehab OT Goals Patient Stated Goal: to stop falling OT Goal Formulation: With patient Time For Goal Achievement: 12/10/17 Potential to Achieve Goals: Good  OT Frequency: Min 2X/week   Barriers to D/C:            Co-evaluation              AM-PAC PT "6 Clicks" Daily Activity     Outcome Measure Help from another person  eating meals?: None Help from another person taking care of personal grooming?: None Help from another person toileting, which includes using toliet, bedpan, or urinal?: A Lot Help from another person bathing (including washing, rinsing, drying)?: A Lot Help from another person to put on and taking off regular upper body clothing?: A Little Help from another person to put on and taking off regular lower body clothing?: A Lot 6 Click Score: 17   End of Session Equipment Utilized During Treatment: Gait belt;Rolling walker Nurse Communication: Mobility status  Activity Tolerance: Patient tolerated treatment well Patient left: in bed;with bed alarm set;with call bell/phone within reach;with family/visitor present  OT Visit Diagnosis: Unsteadiness on feet (R26.81);Other abnormalities of gait and mobility (R26.89);Muscle weakness (generalized) (M62.81)                Time: 4580-9983 OT Time Calculation (min): 15 min Charges:  OT General Charges $OT Visit: 1 Visit OT Evaluation $OT Eval Moderate Complexity: [place], OT Acute Rehabilitation Services Pager [phone] Office [phone]   Delight Stare 11/26/2017, 3:03 PM

## 2017-11-26 NOTE — Progress Notes (Signed)
PROGRESS NOTE    Tara Dunn  JKD:[id number] DOB: 1940/03/01 DOA: 11/24/2017 PCP: Fanny Bien, MD    Brief Narrative:  77 year old female who presented left lower extremity weakness for greater than 2 weeks.  She does have significant past medical history for recurrent CVAs, residual right-sided hemiparesis, hypertension, dyslipidemia, obesity, OSA, GERD, chronic kidney disease stage III, and diastolic heart failure.  Reported left lower extremity weakness, associated with ambulatory dysfunction and multiple falls. On her initial physical examination blood pressure 164/99, heart rate 68, respiratory rate 18, temperature 98.4, oxygen saturation 96%.  Lungs are clear to auscultation bilaterally, heart S1-S2 present rhythmic, abdomen soft nontender, no lower extremity edema, positive for left lower extremity weakness, and chronic right-sided weakness.  Head CT negative for acute CVA.  EKG sinus rhythm, left axis deviation, positive LVH, PVCs, QRS prolongation.   Patient was admitted to the hospital with working diagnosis of left lower extremity weakness, rule out acute CVA.   Assessment & Plan:   Active Problems:   Left leg weakness   Acute CVA (cerebrovascular accident) ([place])   1. Ambulatory dysfunction. MRI with severe chronic small vessel ischemic disease, numerous chronic lacunar infarcts and chronic microhemorrhages. Patient continue to be very weak and deconditioned, unsafe discharge, physical therapy evaluation recommends SNF. Continue clopidogrel and atorvastatin.   2. HTN. Continue blood pressure control with carvedilol. Blood pressure systolic 099 mmHg. Continue isosorbide/ hydralazine (bidil).   3. Dyslipidemia. Continue atorvastatin.   4. OSA. Continue Cpap.   5. Urine infection (suspected, present on admission). UA with no pyuria, will follow on urine culture, patient had one dose of IV ceftriaxone.   6. CKD stage 3. Stable renal function with serum cr at 1,52  with K at 4,1 and serum bicarbonate at 25.     DVT prophylaxis: enoxaparin   Code Status: full Family Communication: I spoke with patient's daughter at the bedside and all questions were addressed.  Disposition Plan/ discharge barriers:  Pending SNF placement    Consultants:     Procedures:     Antimicrobials:       Subjective: Patient continue to have generalized weakness and significant ambulatory dysfunction, no chest pain, no nausea or vomiting.   Objective: Vitals:   11/25/17 2314 11/26/17 0431 11/26/17 0726 11/26/17 0727  BP: 138/62 130/90  (!) 170/92  Pulse: 64 83  77  Resp: 18 17  15   Temp: 98.4 F (36.9 C) 98.6 F (37 C) 98.9 F (37.2 C)   TempSrc: Oral Oral Oral   SpO2: 99% 99%  100%    Intake/Output Summary (Last 24 hours) at 11/26/2017 0844 Last data filed at 11/26/2017 0342 Gross per 24 hour  Intake 820 ml  Output 900 ml  Net -80 ml   There were no vitals filed for this visit.  Examination:   General: deconditioned  Neurology: Awake and alert, strength 4/5 all four extremities.  E ENT: mild pallor, no icterus, oral mucosa moist Cardiovascular: No JVD. S1-S2 present, rhythmic, no gallops, rubs, or murmurs. No lower extremity edema. Pulmonary: vesicular breath sounds bilaterally, adequate air movement, no wheezing, rhonchi or rales. Gastrointestinal. Abdomen protuberant no organomegaly, non tender, no rebound or guarding Skin. No rashes Musculoskeletal: no joint deformities     Data Reviewed: I have personally reviewed following labs and imaging studies  CBC: Recent Labs  Lab 11/24/17 1259 11/24/17 1311 11/25/17 1237  WBC 4.7  --  5.0  NEUTROABS 3.2  --   --   HGB 11.4* 12.6  13.2  HCT 38.1 37.0 42.4  MCV 86.2  --  85.0  PLT 234  --  588   Basic Metabolic Panel: Recent Labs  Lab 11/23/17 1033 11/24/17 1259 11/24/17 1311 11/25/17 1237 11/26/17 0323  NA  --  137 137  --  138  K  --  4.1 4.1  --  4.1  CL  --  103 101  --   103  CO2  --  26  --   --  25  GLUCOSE  --  131* 126*  --  98  BUN  --  23 26*  --  22  CREATININE  --  1.53* 1.50* 1.36* 1.52*  CALCIUM  --  10.2  --   --  9.9  MG 1.8  --   --  1.8  --   PHOS  --   --   --  3.2  --    GFR: CrCl cannot be calculated (Unknown ideal weight.). Liver Function Tests: Recent Labs  Lab 11/24/17 1259 11/26/17 0323  AST 17 15  ALT 14 12  ALKPHOS 98 86  BILITOT 0.6 0.6  PROT 6.2* 5.9*  ALBUMIN 3.5 3.1*   No results for input(s): LIPASE, AMYLASE in the last 168 hours. No results for input(s): AMMONIA in the last 168 hours. Coagulation Profile: Recent Labs  Lab 11/24/17 1259  INR 1.06   Cardiac Enzymes: No results for input(s): CKTOTAL, CKMB, CKMBINDEX, TROPONINI in the last 168 hours. BNP (last 3 results) No results for input(s): PROBNP in the last 8760 hours. HbA1C: Recent Labs    11/25/17 1237  HGBA1C 5.9*   CBG: Recent Labs  Lab 11/24/17 1430  GLUCAP 95   Lipid Profile: Recent Labs    11/25/17 1237  CHOL 138  HDL 51  LDLCALC 75  TRIG 62  CHOLHDL 2.7   Thyroid Function Tests: No results for input(s): TSH, T4TOTAL, FREET4, T3FREE, THYROIDAB in the last 72 hours. Anemia Panel: No results for input(s): VITAMINB12, FOLATE, FERRITIN, TIBC, IRON, RETICCTPCT in the last 72 hours.    Radiology Studies: I have reviewed all of the imaging during this hospital visit personally     Scheduled Meds: . allopurinol  200 mg Oral Daily  . atorvastatin  80 mg Oral Daily  . carvedilol  25 mg Oral BID WC  . clopidogrel  75 mg Oral Daily  . heparin  5,000 Units Subcutaneous Q8H  . isosorbide-hydrALAZINE  2 tablet Oral BID   Continuous Infusions: . cefTRIAXone (ROCEPHIN)  IV Stopped (11/25/17 2235)     LOS: 0 days        Jurline Folger Gerome Apley, MD [facility] Pager [phone]

## 2017-11-27 DIAGNOSIS — R262 Difficulty in walking, not elsewhere classified: Secondary | ICD-10-CM

## 2017-11-27 DIAGNOSIS — R296 Repeated falls: Secondary | ICD-10-CM | POA: Diagnosis not present

## 2017-11-27 DIAGNOSIS — R29898 Other symptoms and signs involving the musculoskeletal system: Secondary | ICD-10-CM | POA: Diagnosis not present

## 2017-11-27 DIAGNOSIS — R69 Illness, unspecified: Secondary | ICD-10-CM | POA: Diagnosis not present

## 2017-11-27 DIAGNOSIS — I639 Cerebral infarction, unspecified: Secondary | ICD-10-CM | POA: Diagnosis not present

## 2017-11-27 DIAGNOSIS — I10 Essential (primary) hypertension: Secondary | ICD-10-CM

## 2017-11-27 LAB — BASIC METABOLIC PANEL
Anion gap: 7 (ref 5–15)
BUN: 25 mg/dL — ABNORMAL HIGH (ref 8–23)
CALCIUM: 9.9 mg/dL (ref 8.9–10.3)
CO2: 26 mmol/L (ref 22–32)
CREATININE: 1.54 mg/dL — AB (ref 0.44–1.00)
Chloride: 106 mmol/L (ref 98–111)
GFR calc Af Amer: 36 mL/min — ABNORMAL LOW (ref >60)
GFR calc non Af Amer: 31 mL/min — ABNORMAL LOW (ref >60)
GLUCOSE: 108 mg/dL — AB (ref 70–99)
Potassium: 3.9 mmol/L (ref 3.5–5.1)
Sodium: 139 mmol/L (ref 135–145)

## 2017-11-27 LAB — URINE CULTURE: Culture: <10000 — AB

## 2017-11-27 NOTE — Progress Notes (Signed)
PROGRESS NOTE    Tara Dunn  IDP:[id number] DOB: 06/13/1940 DOA: 11/24/2017 PCP: Fanny Bien, MD    Brief Narrative:  77 year old female who presented left lower extremity weakness for greater than 2 weeks.  She does have significant past medical history for recurrent CVAs, residual right-sided hemiparesis, hypertension, dyslipidemia, obesity, OSA, GERD, chronic kidney disease stage III, and diastolic heart failure.  Reported left lower extremity weakness, associated with ambulatory dysfunction and multiple falls. On her initial physical examination blood pressure 164/99, heart rate 68, respiratory rate 18, temperature 98.4, oxygen saturation 96%.  Lungs are clear to auscultation bilaterally, heart S1-S2 present rhythmic, abdomen soft nontender, no lower extremity edema, positive for left lower extremity weakness, and chronic right-sided weakness.  Head CT negative for acute CVA.  EKG sinus rhythm, left axis deviation, positive LVH, PVCs, QRS prolongation.   Patient was admitted to the hospital with working diagnosis of left lower extremity weakness, rule out acute CVA.   Assessment & Plan:   Active Problems:   Left leg weakness   Acute CVA (cerebrovascular accident) ([place])  1. Ambulatory dysfunction. MRI with severe chronic small vessel ischemic disease, numerous chronic lacunar infarcts and chronic microhemorrhages. Persistent weakness and deconditioning, continue to be unsafe discharge, pending placement at SNF. Continue medical therapy with clopidogrel and atorvastatin.   2. HTN. Blood pressure control with isosorbide/ hydralazine (bidil). Systolic blood pressure 443 to 156 mmHg.   3. Dyslipidemia. On atorvastatin.   4. OSA. Patient refused Cpap last night.    5. Urine infection (suspected, present on admission). Urine culture with less than 100, 000 CFU, insignificant growth, patient has one dose of IV ceftriaxone on admission, no further symptoms, will continue to  hold on antibiotic therapy.  6. CKD stage 3. Renal function with stable serum cr at 1,54 with K at 3,9 and serum bicarbonate at 26. Avoid nephrotoxic medications and hypotension.      DVT prophylaxis: enoxaparin   Code Status: full Family Communication: I spoke with patient's daughter at the bedside and all questions were addressed.  Disposition Plan/ discharge barriers:  Pending SNF placement    Consultants:     Procedures:     Antimicrobials:   Subjective: Patient continue to be very weak and deconditioned, no nausea or vomiting, no chest pain or dyspnea.   Objective: Vitals:   11/26/17 2000 11/26/17 2356 11/27/17 0312 11/27/17 0832  BP: (!) 133/91 (!) 130/99 130/85 (!) 156/86  Pulse: 85 73 82   Resp: (!) 24 19 19    Temp: 98.8 F (37.1 C) 98.1 F (36.7 C) 98 F (36.7 C) 98.2 F (36.8 C)  TempSrc: Oral Oral Oral Oral  SpO2: 97% 98% 98% 100%    Intake/Output Summary (Last 24 hours) at 11/27/2017 0859 Last data filed at 11/26/2017 1300 Gross per 24 hour  Intake 240 ml  Output -  Net 240 ml   There were no vitals filed for this visit.  Examination:   General: deconditioned  Neurology: Awake and alert, non focal/ 4/5 all four extremities.   E ENT: mild pallor, no icterus, oral mucosa moist Cardiovascular: No JVD. S1-S2 present, rhythmic, no gallops, rubs, or murmurs. No lower extremity edema. Pulmonary: positive breath sounds bilaterally, adequate air movement, no wheezing, rhonchi or rales. Gastrointestinal. Abdomen protuberant, no organomegaly, non tender, no rebound or guarding Skin. No rashes Musculoskeletal: no joint deformities     Data Reviewed: I have personally reviewed following labs and imaging studies  CBC: Recent Labs  Lab 11/24/17 1259  11/24/17 1311 11/25/17 1237  WBC 4.7  --  5.0  NEUTROABS 3.2  --   --   HGB 11.4* 12.6 13.2  HCT 38.1 37.0 42.4  MCV 86.2  --  85.0  PLT 234  --  726   Basic Metabolic Panel: Recent  Labs  Lab 11/23/17 1033 11/24/17 1259 11/24/17 1311 11/25/17 1237 11/26/17 0323 11/27/17 0456  NA  --  137 137  --  138 139  K  --  4.1 4.1  --  4.1 3.9  CL  --  103 101  --  103 106  CO2  --  26  --   --  25 26  GLUCOSE  --  131* 126*  --  98 108*  BUN  --  23 26*  --  22 25*  CREATININE  --  1.53* 1.50* 1.36* 1.52* 1.54*  CALCIUM  --  10.2  --   --  9.9 9.9  MG 1.8  --   --  1.8  --   --   PHOS  --   --   --  3.2  --   --    GFR: CrCl cannot be calculated (Unknown ideal weight.). Liver Function Tests: Recent Labs  Lab 11/24/17 1259 11/26/17 0323  AST 17 15  ALT 14 12  ALKPHOS 98 86  BILITOT 0.6 0.6  PROT 6.2* 5.9*  ALBUMIN 3.5 3.1*   No results for input(s): LIPASE, AMYLASE in the last 168 hours. No results for input(s): AMMONIA in the last 168 hours. Coagulation Profile: Recent Labs  Lab 11/24/17 1259  INR 1.06   Cardiac Enzymes: No results for input(s): CKTOTAL, CKMB, CKMBINDEX, TROPONINI in the last 168 hours. BNP (last 3 results) No results for input(s): PROBNP in the last 8760 hours. HbA1C: Recent Labs    11/25/17 1237  HGBA1C 5.9*   CBG: Recent Labs  Lab 11/24/17 1430  GLUCAP 95   Lipid Profile: Recent Labs    11/25/17 1237  CHOL 138  HDL 51  LDLCALC 75  TRIG 62  CHOLHDL 2.7   Thyroid Function Tests: No results for input(s): TSH, T4TOTAL, FREET4, T3FREE, THYROIDAB in the last 72 hours. Anemia Panel: No results for input(s): VITAMINB12, FOLATE, FERRITIN, TIBC, IRON, RETICCTPCT in the last 72 hours.    Radiology Studies: I have reviewed all of the imaging during this hospital visit personally     Scheduled Meds: . allopurinol  200 mg Oral Daily  . atorvastatin  80 mg Oral Daily  . carvedilol  25 mg Oral BID WC  . clopidogrel  75 mg Oral Daily  . heparin  5,000 Units Subcutaneous Q8H  . isosorbide-hydrALAZINE  2 tablet Oral BID   Continuous Infusions: . cefTRIAXone (ROCEPHIN)  IV Stopped (11/26/17 2210)     LOS: 0 days          Mauricio Gerome Apley, MD [facility] Pager [phone]

## 2017-11-27 NOTE — Progress Notes (Signed)
Pt refused CPAP

## 2017-11-27 NOTE — Progress Notes (Signed)
Patient states she does not wear CPAP at home and does not wish to wear one here. NO distress noted at this time.

## 2017-11-28 DIAGNOSIS — I639 Cerebral infarction, unspecified: Secondary | ICD-10-CM | POA: Diagnosis not present

## 2017-11-28 DIAGNOSIS — N3000 Acute cystitis without hematuria: Secondary | ICD-10-CM

## 2017-11-28 DIAGNOSIS — R296 Repeated falls: Secondary | ICD-10-CM | POA: Diagnosis not present

## 2017-11-28 LAB — COMPREHENSIVE METABOLIC PANEL
ALK PHOS: 91 U/L (ref 38–126)
ALT: 12 U/L (ref 0–44)
AST: 20 U/L (ref 15–41)
Albumin: 3.3 g/dL — ABNORMAL LOW (ref 3.5–5.0)
Anion gap: 12 (ref 5–15)
BILIRUBIN TOTAL: 0.5 mg/dL (ref 0.3–1.2)
BUN: 21 mg/dL (ref 8–23)
CALCIUM: 10.1 mg/dL (ref 8.9–10.3)
CO2: 21 mmol/L — ABNORMAL LOW (ref 22–32)
Chloride: 104 mmol/L (ref 98–111)
Creatinine, Ser: 1.4 mg/dL — ABNORMAL HIGH (ref 0.44–1.00)
GFR calc non Af Amer: 35 mL/min — ABNORMAL LOW (ref >60)
GFR, EST AFRICAN AMERICAN: 41 mL/min — AB (ref >60)
Glucose, Bld: 122 mg/dL — ABNORMAL HIGH (ref 70–99)
POTASSIUM: 3.9 mmol/L (ref 3.5–5.1)
Sodium: 137 mmol/L (ref 135–145)
TOTAL PROTEIN: 6 g/dL — AB (ref 6.5–8.1)

## 2017-11-28 LAB — CK: CK TOTAL: 38 U/L (ref 38–234)

## 2017-11-28 LAB — SEDIMENTATION RATE: Sed Rate: 8 mm/hr (ref 0–22)

## 2017-11-28 NOTE — Progress Notes (Addendum)
CSW called daughter, [name] and advised [facility] in [place] do not accept patient's insurance.   CSW faxed out  Referrals to local SNFs per the patient's daughter;'s request.   Thurmond Butts, [place] Social Worker [phone]

## 2017-11-28 NOTE — Progress Notes (Signed)
Pt has refused cpap at this time. RT will monitor.  No machine in room at this time.

## 2017-11-28 NOTE — Evaluation (Signed)
Physical Therapy Treatment Patient Details Name: Tara Dunn MRN: [id number] DOB: 08-Apr-1940 Today's Date: 11/28/2017   History of Present Illness  Pt is a 77 y/o female admitted secondary to multiple falls at home and LLE weakness. MRI negative for acute abnormality, however, did show chronic lesions from previous CVA. Pt also with questionable UTI. PMH includes CVA with R sided weakness, CHF, OSA on CPAP, HTN, gout, and CKD.   Clinical Impression  Pt was seen for evaluation of pt mobility and noted falls in recent past as well as some difficulty in getting OOB today.  Reporting fatigue but did cover the importance of doing therapy to avoid excessive weakness.  Follow acutely for same, focusing on strength and balance with mobility    Follow Up Recommendations SNF;Supervision/Assistance - 24 hour    Equipment Recommendations  None recommended by PT    Recommendations for Other Services OT consult     Precautions / Restrictions Precautions Precautions: Fall Precaution Comments: Pt reporting multiple falls within the past few weeks.  Restrictions Weight Bearing Restrictions: No      Mobility  Bed Mobility Overal bed mobility: Needs Assistance                Transfers Overall transfer level: Needs assistance                  Ambulation/Gait                Stairs            Wheelchair Mobility    Modified Rankin (Stroke Patients Only)       Balance Overall balance assessment: Needs assistance Sitting-balance support: No upper extremity supported;Feet supported                                         Pertinent Vitals/Pain Pain Assessment: No/denies pain    Home Living                        Prior Function                 Hand Dominance        Extremity/Trunk Assessment                Communication      Cognition Arousal/Alertness: Awake/alert Behavior During Therapy: WFL for tasks  assessed/performed Overall Cognitive Status: Within Functional Limits for tasks assessed                                 General Comments: mild confusion but cooperative      General Comments      Exercises General Exercises - Lower Extremity Ankle Circles/Pumps: AROM;Both;5 reps Quad Sets: AAROM;Both;10 reps Gluteal Sets: 10 reps;Strengthening;Left Hip ABduction/ADduction: AAROM;Both;10 reps   Assessment/Plan    PT Assessment    PT Problem List         PT Treatment Interventions      PT Goals (Current goals can be found in the Care Plan section)  Acute Rehab PT Goals Patient Stated Goal: to stop falling    Frequency Min 2X/week   Barriers to discharge        Co-evaluation               AM-PAC PT "6 Clicks" Daily Activity  Outcome Measure Difficulty turning over in bed (including adjusting bedclothes, sheets and blankets)?: A Little Difficulty moving from lying on back to sitting on the side of the bed? : Unable Difficulty sitting down on and standing up from a chair with arms (e.g., wheelchair, bedside commode, etc,.)?: Unable Help needed moving to and from a bed to chair (including a wheelchair)?: A Little Help needed walking in hospital room?: A Little Help needed climbing 3-5 steps with a railing? : A Little 6 Click Score: 14    End of Session Equipment Utilized During Treatment: Gait belt Activity Tolerance: Patient limited by fatigue Patient left: in chair;with call bell/phone within reach;with chair alarm set Nurse Communication: Mobility status PT Visit Diagnosis: Unsteadiness on feet (R26.81);Muscle weakness (generalized) (M62.81);Repeated falls (R29.6);History of falling (Z91.81)    Time: 4081-4481 PT Time Calculation (min) (ACUTE ONLY): 24 min   Charges:     PT Treatments $Therapeutic Exercise: 8-22 mins $Therapeutic Activity: 8-22 mins       Ramond Dial 11/28/2017, 9:11 PM  Mee Hives, PT MS Acute Rehab Dept.  Number: [place] and [place]

## 2017-11-28 NOTE — Progress Notes (Addendum)
PROGRESS NOTE    Tara Dunn  ZOX:[id number] DOB: Oct 28, 1940 DOA: 11/24/2017 PCP: Fanny Bien, MD    Brief Narrative:  77 year old female who presented left lower extremity weakness for greater than 2 weeks.  She does have significant past medical history for recurrent CVAs, residual right-sided hemiparesis, hypertension, dyslipidemia, obesity, OSA, GERD, chronic kidney disease stage III, and diastolic heart failure.  Reported left lower extremity weakness, associated with ambulatory dysfunction and multiple falls. On her initial physical examination blood pressure 164/99, heart rate 68, respiratory rate 18, temperature 98.4, oxygen saturation 96%.  Lungs are clear to auscultation bilaterally, heart S1-S2 present rhythmic, abdomen soft nontender, no lower extremity edema, positive for left lower extremity weakness, and chronic right-sided weakness.  Head CT negative for acute CVA.  EKG sinus rhythm, left axis deviation, positive LVH, PVCs, QRS prolongation.   Patient was admitted to the hospital with working diagnosis of left lower extremity weakness, rule out acute CVA.   Assessment & Plan:   Active Problems:   Left leg weakness   Acute CVA (cerebrovascular accident) ([place])  1. Ambulatory dysfunction. MRI with severe chronic small vessel ischemic disease, numerous chronic lacunar infarcts and chronic microhemorrhages. Persistent weakness and deconditioning, CK,ESR nl , continues to be unsafe discharge, pending placement at SNF. Continue medical therapy with clopidogrel and atorvastatin.   2. HTN. Blood pressure control with isosorbide/ hydralazine (bidil). Systolic blood pressure 811 to 156 mmHg.   3. Dyslipidemia. On atorvastatin.   4. OSA. Patient refused Cpap last night.    5. Urine infection (suspected, present on admission). Urine culture with less than 100, 000 CFU, insignificant growth, patient has one dose of IV ceftriaxone on admission, no further symptoms, will  continue to hold on antibiotic therapy.  6. CKD stage 3. Renal function with stable serum cr at 1.40  with K at 3,9 and serum bicarbonate at 21. Avoid nephrotoxic medications and hypotension.      DVT prophylaxis: enoxaparin   Code Status: full Family Communication  previous MD spoke with patient's daughter at the bedside and all questions were addressed.  Disposition Plan/ discharge barriers:  Pending SNF placement /insurance authorization, no bed as off 10/21   Consultants:     Procedures:     Antimicrobials:   Subjective: Patient continue to be very weak and deconditioned, states that she has been falling a lot at home, continues to have bilateral lower extremity weakness no nausea or vomiting, no chest pain or dyspnea.   Objective: Vitals:   11/27/17 2305 11/28/17 0322 11/28/17 0753 11/28/17 1213  BP: (!) 155/86 (!) 147/84 (!) 151/97 116/75  Pulse: 91 78 81 81  Resp: _0 Temp: 98.5 F (36.9 C) 98.3 F (36.8 C) 98.6 F (37 C) 97.7 F (36.5 C)  TempSrc: Oral Oral Oral Oral  SpO2: 94% 99% 98% 98%    Intake/Output Summary (Last 24 hours) at 11/28/2017 1344 Last data filed at 11/28/2017 1212 Gross per 24 hour  Intake 840 ml  Output 300 ml  Net 540 ml   There were no vitals filed for this visit.  Examination:   General: deconditioned  Neurology: Awake and alert, non focal/ 4/5 all four extremities.   E ENT: mild pallor, no icterus, oral mucosa moist Cardiovascular: No JVD. S1-S2 present, rhythmic, no gallops, rubs, or murmurs. No lower extremity edema. Pulmonary: positive breath sounds bilaterally, adequate air movement, no wheezing, rhonchi or rales. Gastrointestinal. Abdomen protuberant, no organomegaly, non tender, no rebound or guarding Skin.  No rashes Musculoskeletal: no joint deformities, 4 and 5 strength in bilateral lower extremities     Data Reviewed: I have personally reviewed following labs and imaging  studies  CBC: Recent Labs  Lab 11/24/17 1259 11/24/17 1311 11/25/17 1237  WBC 4.7  --  5.0  NEUTROABS 3.2  --   --   HGB 11.4* 12.6 13.2  HCT 38.1 37.0 42.4  MCV 86.2  --  85.0  PLT 234  --  220   Basic Metabolic Panel: Recent Labs  Lab 11/23/17 1033  11/24/17 1259 11/24/17 1311 11/25/17 1237 11/26/17 0323 11/27/17 0456 11/28/17 0945  NA  --   --  137 137  --  138 139 137  K  --   --  4.1 4.1  --  4.1 3.9 3.9  CL  --   --  103 101  --  103 106 104  CO2  --   --  26  --   --  25 26 21*  GLUCOSE  --   --  131* 126*  --  98 108* 122*  BUN  --   --  23 26*  --  22 25* 21  CREATININE  --    < > 1.53* 1.50* 1.36* 1.52* 1.54* 1.40*  CALCIUM  --   --  10.2  --   --  9.9 9.9 10.1  MG 1.8  --   --   --  1.8  --   --   --   PHOS  --   --   --   --  3.2  --   --   --    < > = values in this interval not displayed.   GFR: CrCl cannot be calculated (Unknown ideal weight.). Liver Function Tests: Recent Labs  Lab 11/24/17 1259 11/26/17 0323 11/28/17 0945  AST _0 ALT _1 ALKPHOS 98 86 91  BILITOT 0.6 0.6 0.5  PROT 6.2* 5.9* 6.0*  ALBUMIN 3.5 3.1* 3.3*   No results for input(s): LIPASE, AMYLASE in the last 168 hours. No results for input(s): AMMONIA in the last 168 hours. Coagulation Profile: Recent Labs  Lab 11/24/17 1259  INR 1.06   Cardiac Enzymes: Recent Labs  Lab 11/28/17 0945  CKTOTAL 38   BNP (last 3 results) No results for input(s): PROBNP in the last 8760 hours. HbA1C: No results for input(s): HGBA1C in the last 72 hours. CBG: Recent Labs  Lab 11/24/17 1430  GLUCAP 95   Lipid Profile: No results for input(s): CHOL, HDL, LDLCALC, TRIG, CHOLHDL, LDLDIRECT in the last 72 hours. Thyroid Function Tests: No results for input(s): TSH, T4TOTAL, FREET4, T3FREE, THYROIDAB in the last 72 hours. Anemia Panel: No results for input(s): VITAMINB12, FOLATE, FERRITIN, TIBC, IRON, RETICCTPCT in the last 72 hours.    Radiology Studies: I have  reviewed all of the imaging during this hospital visit personally     Scheduled Meds: . allopurinol  200 mg Oral Daily  . atorvastatin  80 mg Oral Daily  . carvedilol  25 mg Oral BID WC  . clopidogrel  75 mg Oral Daily  . heparin  5,000 Units Subcutaneous Q8H  . isosorbide-hydrALAZINE  2 tablet Oral BID   Continuous Infusions:    LOS: 0 days        Reyne Dumas, MD [facility] Pager [phone]

## 2017-11-28 NOTE — Plan of Care (Signed)
  Problem: Education: Goal: Knowledge of General Education information will improve Description Including pain rating scale, medication(s)/side effects and non-pharmacologic comfort measures Outcome: Progressing   Problem: Health Behavior/Discharge Planning: Goal: Ability to manage health-related needs will improve Outcome: Progressing   Problem: Clinical Measurements: Goal: Ability to maintain clinical measurements within normal limits will improve Outcome: Progressing Goal: Will remain free from infection Outcome: Progressing Goal: Diagnostic test results will improve Outcome: Progressing Goal: Respiratory complications will improve Outcome: Progressing Goal: Cardiovascular complication will be avoided Outcome: Progressing   Problem: Activity: Goal: Risk for activity intolerance will decrease Outcome: Progressing   Problem: Nutrition: Goal: Adequate nutrition will be maintained Outcome: Progressing   Problem: Coping: Goal: Level of anxiety will decrease Outcome: Progressing   Problem: Elimination: Goal: Will not experience complications related to bowel motility Outcome: Progressing Goal: Will not experience complications related to urinary retention Outcome: Progressing   Problem: Pain Managment: Goal: General experience of comfort will improve Outcome: Progressing   Problem: Safety: Goal: Ability to remain free from injury will improve Outcome: Progressing   Problem: Skin Integrity: Goal: Risk for impaired skin integrity will decrease Outcome: Progressing   Problem: Education: Goal: Knowledge of disease or condition will improve Outcome: Progressing Goal: Knowledge of secondary prevention will improve Outcome: Progressing Goal: Knowledge of patient specific risk factors addressed and post discharge goals established will improve Outcome: Progressing   Problem: Coping: Goal: Will verbalize positive feelings about self Outcome: Progressing   Problem:  Health Behavior/Discharge Planning: Goal: Ability to manage health-related needs will improve Outcome: Progressing   Problem: Self-Care: Goal: Ability to participate in self-care as condition permits will improve Outcome: Progressing Goal: Verbalization of feelings and concerns over difficulty with self-care will improve Outcome: Progressing Goal: Ability to communicate needs accurately will improve Outcome: Progressing   Problem: Nutrition: Goal: Risk of aspiration will decrease Outcome: Progressing   Problem: Ischemic Stroke/TIA Tissue Perfusion: Goal: Complications of ischemic stroke/TIA will be minimized Outcome: Progressing   

## 2017-11-28 NOTE — Consult Note (Addendum)
   [facility] CM Inpatient Consult   11/28/2017  Yanis Schlee 1940-04-22 [id number]    Mrs. Wolak was recently referred to [place] [facility] program by PCP office.  However, Mrs. Zuluaga was admitted to the hospital prior to Telephonic RNCM contact.  Went to bedside to speak with Mrs. Scarfone about [facility] Care Management follow up. She states the plan is to go to SNF. States her daughter wants her to go to a SNF facility in [place], [place]. Mrs. Hardge gave Probation officer to permission to contact her daugter, Dickie La at [phone] to discuss further.   Telephone call to speak with [name] (dtr) at [phone]. Explained [facility] services. [name] also endorses discharge plan is for SNF. [name] states her mother lives with her but [name] will have to have surgery soon. States she wants SNF placement in [place] so it can be between she and her sister that lives in [place]. Explained to [name] that this information will be passed along to inpatient team. Also [name] asked that she be contacted regarding her mother's discharge plans and care at [phone].  [facility] Care Management written consent obtained. [facility] folder provided.   Will continue to follow along for disposition plans and progression. Will make appropriate [facility] Care Management referrals once disposition plans are confirmed.   Made (covering) inpatient RNCM aware of all of the above notes.    Marthenia Rolling, MSN-Ed, RN,BSN [facility] Liaison [phone]

## 2017-11-29 ENCOUNTER — Telehealth (HOSPITAL_COMMUNITY): Payer: Self-pay

## 2017-11-29 ENCOUNTER — Other Ambulatory Visit (HOSPITAL_COMMUNITY): Payer: Self-pay

## 2017-11-29 DIAGNOSIS — R29898 Other symptoms and signs involving the musculoskeletal system: Secondary | ICD-10-CM | POA: Diagnosis not present

## 2017-11-29 LAB — COMPREHENSIVE METABOLIC PANEL
ALK PHOS: 88 U/L (ref 38–126)
ALT: 13 U/L (ref 0–44)
AST: 17 U/L (ref 15–41)
Albumin: 3.2 g/dL — ABNORMAL LOW (ref 3.5–5.0)
Anion gap: 9 (ref 5–15)
BUN: 21 mg/dL (ref 8–23)
CALCIUM: 10 mg/dL (ref 8.9–10.3)
CO2: 22 mmol/L (ref 22–32)
Chloride: 107 mmol/L (ref 98–111)
Creatinine, Ser: 1.39 mg/dL — ABNORMAL HIGH (ref 0.44–1.00)
GFR, EST AFRICAN AMERICAN: 41 mL/min — AB (ref >60)
GFR, EST NON AFRICAN AMERICAN: 36 mL/min — AB (ref >60)
Glucose, Bld: 96 mg/dL (ref 70–99)
Potassium: 3.9 mmol/L (ref 3.5–5.1)
SODIUM: 138 mmol/L (ref 135–145)
Total Bilirubin: 0.5 mg/dL (ref 0.3–1.2)
Total Protein: 5.8 g/dL — ABNORMAL LOW (ref 6.5–8.1)

## 2017-11-29 NOTE — Progress Notes (Signed)
F/U    Pt is currently admitted in [facility] 3w for falls. Pt stated that she will be discharged to a SNF. The SNF that they are considering is located in [place]. I will f/u with pt's daughter this week to confirm.

## 2017-11-29 NOTE — Progress Notes (Signed)
CSW met with patient and daughter at bedside to discuss SNF offers. Patient and daughter selected [facility]. CSW confirmed bed available at [facility]. CSW contacted [employer] to initiate insurance authorization.  CSW to follow.  Laveda Abbe, [place] Clinical Social Worker [phone]

## 2017-11-29 NOTE — Progress Notes (Signed)
RT offered pt CPAP for the night. Pt declined stating she did not want it. RT will continue to monitor.

## 2017-11-29 NOTE — Discharge Summary (Signed)
Physician Discharge Summary  Tara Dunn NUU:[id number] DOB: [birth year] DOA: 11/24/2017  PCP: Tara Bien, MD  Admit date: 11/24/2017 Discharge date: 11/29/2017  Admitted From: Home Disposition: Skilled nursing facility  Recommendations for Outpatient Follow-up:  1. Follow up with PCP in 1-2 weeks 2. Please obtain BMP/CBC in one week your next doctors visit.   Discharge Condition: Stable CODE STATUS: Full code Diet recommendation: Cardiac diet  Brief/Interim Summary: 77 year old with 78 history of recurrent CVA, residual right-sided hemiparesis, essential hypertension, hyperlipidemia, obesity, obstructive sleep apnea, chronic kidney disease stage III, diastolic congestive heart failure came to the hospital with complains of left lower extremity weakness.  Patient CTA of the head was negative.  MRI of the brain showed severe chronic small vessel ischemic disease with numerous chronic lacunar infarct and chronic microhemorrhages.  No other acute findings were noted.  She was evaluated physical therapy who recommended skilled nursing facility. At this point patient has reached maximum benefit from an hospital stay and stable to be discharged to skilled nursing facility for further rehabilitation.   Discharge Diagnoses:  Principal Problem:   Left leg weakness Active Problems:   Chronic combined systolic and diastolic CHF (congestive heart failure) (HCC)   CKD (chronic kidney disease), stage III (HCC)   Essential hypertension   OSA (obstructive sleep apnea)   Obesity (BMI 30-39.9)   Stage 3 chronic kidney disease (HCC)  Left lower extremity weakness/ambulatory dysfunction History of CVA with residual right-sided hemiparesis - CT of the head was negative for any acute finding, MRI of the brain showed chronic severe small blood vessel changes with chronic lacunar infarcts.  PT/OT recommended skilled nursing facility therefore arrangements to be made. - Continue home regimen at  this time.  At this point limited medical interventions are available. -Continue Plavix  Chronic combined systolic and diastolic congestive heart failure, ejection fraction 34%, grade 1 diastolic dysfunction - Patient currently appears to be euvolemic and compensated.  Resume home medications-Lipitor 80 mg daily, Coreg 25 mg, BiDil  Essential hypertension -Continue Coreg 25 mg twice daily  Hyperlipidemia -Lipitor 80 mg daily Chronic kidney disease stage III  Subcutaneous heparin while patient is here Full code Planning to discharge patient to skilled nursing facility.  Discharge Instructions   Allergies as of 11/29/2017      Reactions   Shellfish Allergy Hives      Medication List    STOP taking these medications   allopurinol 100 MG tablet Commonly known as:  ZYLOPRIM   colchicine 0.6 MG tablet     TAKE these medications   ALKA-SELTZER ANTACID PO Take by mouth as needed.   atorvastatin 40 MG tablet Commonly known as:  LIPITOR Take 80 mg by mouth daily.   carvedilol 25 MG tablet Commonly known as:  COREG TAKE 1 TABLET(25 MG) BY MOUTH TWICE DAILY What changed:  See the new instructions.   clopidogrel 75 MG tablet Commonly known as:  PLAVIX Take 1 tablet (75 mg total) by mouth daily.   DULoxetine 30 MG capsule Commonly known as:  CYMBALTA Take 30 mg by mouth daily.   furosemide 20 MG tablet Commonly known as:  LASIX Take 2 tablets (40 mg total) by mouth daily. Can take extra 40 mg as needed   isosorbide-hydrALAZINE 20-37.5 MG tablet Commonly known as:  BIDIL Take 2 tablets by mouth 2 (two) times daily.   magnesium oxide 400 MG tablet Commonly known as:  MAG-OX Take 1 tablet (400 mg total) by mouth 2 (two) times daily. What changed:  when to take this   omeprazole 20 MG capsule Commonly known as:  PRILOSEC Take 20 mg by mouth daily. Can also take two times daily as needed   potassium chloride SA 20 MEQ tablet Commonly known as:   K-DUR,KLOR-CON Take 20 mEq by mouth daily.      Follow-up Information    Tara Bien, MD. Schedule an appointment as soon as possible for a visit in 1 week(s).   Specialty:  Family Medicine Contact information: [address] [place] [address] [phone]          Allergies  Allergen Reactions  . Shellfish Allergy Hives    You were cared for by a hospitalist during your hospital stay. If you have any questions about your discharge medications or the care you received while you were in the hospital after you are discharged, you can call the unit and asked to speak with the hospitalist on call if the hospitalist that took care of you is not available. Once you are discharged, your primary care physician will handle any further medical issues. Please note that no refills for any discharge medications will be authorized once you are discharged, as it is imperative that you return to your primary care physician (or establish a relationship with a primary care physician if you do not have one) for your aftercare needs so that they can reassess your need for medications and monitor your lab values.  Consultations:  None   Procedures/Studies: Ct Head Wo Contrast  Result Date: 11/24/2017 CLINICAL DATA:  Falls and extremity weakness. History of prior cerebral infarction. EXAM: CT HEAD WITHOUT CONTRAST TECHNIQUE: Contiguous axial images were obtained from the base of the skull through the vertex without intravenous contrast. COMPARISON:  Prior CT of the head on 04/18/2017. MRI of the head on 04/19/2017. FINDINGS: Brain: Stable small vessel ischemic disease in the periventricular white matter and old lacunar infarcts of the left internal capsule and bilateral basal ganglia. The brain demonstrates no evidence of hemorrhage, acute infarction, edema, mass effect, extra-axial fluid collection, hydrocephalus or mass lesion. Vascular: No hyperdense vessel or unexpected  calcification. Skull: Normal. Negative for fracture or focal lesion. Sinuses/Orbits: No acute finding. Other: None. IMPRESSION: Stable small vessel disease and old lacunar infarcts. Electronically Signed   By: Aletta Edouard M.D.   On: 11/24/2017 13:47   Mr Jodene Nam Head Wo Contrast  Result Date: 11/25/2017 CLINICAL DATA:  Acute left leg weakness. Prior stroke with chronic right sided weakness. EXAM: MRI HEAD WITHOUT CONTRAST MRA HEAD WITHOUT CONTRAST MRA NECK WITHOUT CONTRAST TECHNIQUE: Multiplanar, multiecho pulse sequences of the brain and surrounding structures were obtained without intravenous contrast. Angiographic images of the Circle of Willis were obtained using MRA technique without intravenous contrast. Angiographic images of the neck were obtained using MRA technique without intravenous contrast. Carotid stenosis measurements (when applicable) are obtained utilizing NASCET criteria, using the distal internal carotid diameter as the denominator. COMPARISON:  Head CT 11/24/2017.  Head MRI/MRA 04/19/2017. FINDINGS: MRI HEAD FINDINGS Brain: There is no evidence of acute infarct, mass, midline shift, or extra-axial fluid collection. Numerous chronic microhemorrhages are seen in the cerebellum, brainstem, and both cerebral hemispheres including deep gray nuclei and may reflect chronic hypertension. A greater number of microhemorrhages are shown on the current examination compared to the prior MRI which is likely predominantly due to differences in imaging technique. Chronic small infarcts are again seen in the cerebellum, brainstem, deep gray nuclei, and cerebral white matter bilaterally. Patchy  to confluent cerebral white matter T2 hyperintensities are nonspecific but compatible with extensive chronic small vessel ischemic disease. Chronic small vessel ischemia has overall progressed from the prior MRI. Wallerian degeneration is noted in the left cerebral peduncle. Mild cerebral atrophy is within normal  limits for age. Vascular: Abnormal appearance of the distal left vertebral artery, more fully evaluated below. Other major intracranial vascular flow voids are preserved. Skull and upper cervical spine: No suspicious marrow lesion. Disc degeneration at C4-5 greater than C3-4. Sinuses/Orbits: Bilateral cataract extraction. Paranasal sinuses and mastoid air cells are clear. Other: None. MRA HEAD FINDINGS The intracranial vertebral arteries are patent with the right being strongly dominant. There is mild to moderate multifocal left V4 stenosis. Patent right PICA, bilateral AICA, and bilateral SCA origins are identified. The basilar artery is mildly small in caliber diffusely without significant focal stenosis. There are large posterior communicating arteries bilaterally with mild hypoplasia of the left P1 segment. Both PCAs are patent without evidence of significant proximal stenosis. The internal carotid arteries are widely patent from skull base to carotid termini. 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 ACAs and MCAs are patent without evidence of flow limiting proximal stenosis. The right A1 segment is again noted to be mildly hypoplastic. There is some asymmetric attenuation of left MCA branch vessels compared to the right. A 2 mm superiorly directed outpouching from the right M1 segment is unchanged and suggestive of a small aneurysm without a vessel seen arising from it to support an alternative diagnosis of infundibulum. MRA NECK FINDINGS Assessment is limited by noncontrast technique and motion artifact. Flow related signal is poor in the proximal arch vessels, likely from technical factors. There is a common origin of the brachiocephalic and left common carotid arteries, a normal variant. Aside from poor assessment of the proximal right greater than left common carotid arteries, aside from poor evaluation proximally, the common carotid  arteries appear widely patent. No significant ICA stenosis is identified, with evidence of less than 25% narrowing of the left ICA origin. Antegrade flow is visualized in both vertebral arteries. The right vertebral artery is strongly dominant without evidence of significant stenosis allowing for nondiagnostic assessment of its origin. The left vertebral artery origin and much of the V1 segment are not evaluated. The left V2 segment is patent but diffusely small in caliber with apparent multifocal stenoses which may reflect a combination of atherosclerosis and artifact. IMPRESSION: 1. No acute intracranial abnormality. 2. 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 3. Patent circle of Willis without large vessel occlusion or high-grade proximal intracranial stenosis. 4. 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 5. Unchanged 2 mm right M1 aneurysm. 6. Limited neck MRA due to motion and noncontrast technique. 7. No evidence of significant carotid or right vertebral artery stenosis in the neck. 8. Diminutive left vertebral artery with multifocal stenosis. Electronically Signed   By: Logan Bores M.D.   On: 11/25/2017 12:48   Mr Jodene Nam Neck Wo Contrast  Result Date: 11/25/2017 CLINICAL DATA:  Acute left leg weakness. Prior stroke with chronic right sided weakness. EXAM: MRI HEAD WITHOUT CONTRAST MRA HEAD WITHOUT CONTRAST MRA NECK WITHOUT CONTRAST TECHNIQUE: Multiplanar, multiecho pulse sequences of the brain and surrounding structures were obtained without intravenous contrast. Angiographic images of the Circle of Willis were obtained using MRA technique without intravenous contrast. Angiographic images of  the neck were obtained using MRA technique without intravenous contrast. Carotid stenosis measurements (when applicable) are obtained utilizing NASCET criteria, using the distal internal carotid  diameter as the denominator. COMPARISON:  Head CT 11/24/2017.  Head MRI/MRA 04/19/2017. FINDINGS: MRI HEAD FINDINGS Brain: There is no evidence of acute infarct, mass, midline shift, or extra-axial fluid collection. Numerous chronic microhemorrhages are seen in the cerebellum, brainstem, and both cerebral hemispheres including deep gray nuclei and may reflect chronic hypertension. A greater number of microhemorrhages are shown on the current examination compared to the prior MRI which is likely predominantly due to differences in imaging technique. Chronic small infarcts are again seen in the cerebellum, brainstem, deep gray nuclei, and cerebral white matter bilaterally. Patchy to confluent cerebral white matter T2 hyperintensities are nonspecific but compatible with extensive chronic small vessel ischemic disease. Chronic small vessel ischemia has overall progressed from the prior MRI. Wallerian degeneration is noted in the left cerebral peduncle. Mild cerebral atrophy is within normal limits for age. Vascular: Abnormal appearance of the distal left vertebral artery, more fully evaluated below. Other major intracranial vascular flow voids are preserved. Skull and upper cervical spine: No suspicious marrow lesion. Disc degeneration at C4-5 greater than C3-4. Sinuses/Orbits: Bilateral cataract extraction. Paranasal sinuses and mastoid air cells are clear. Other: None. MRA HEAD FINDINGS The intracranial vertebral arteries are patent with the right being strongly dominant. There is mild to moderate multifocal left V4 stenosis. Patent right PICA, bilateral AICA, and bilateral SCA origins are identified. The basilar artery is mildly small in caliber diffusely without significant focal stenosis. There are large posterior communicating arteries bilaterally with mild hypoplasia of the left P1 segment. Both PCAs are patent without evidence of significant proximal stenosis. The internal carotid arteries are widely patent from  skull base to carotid termini. 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 ACAs and MCAs are patent without evidence of flow limiting proximal stenosis. The right A1 segment is again noted to be mildly hypoplastic. There is some asymmetric attenuation of left MCA branch vessels compared to the right. A 2 mm superiorly directed outpouching from the right M1 segment is unchanged and suggestive of a small aneurysm without a vessel seen arising from it to support an alternative diagnosis of infundibulum. MRA NECK FINDINGS Assessment is limited by noncontrast technique and motion artifact. Flow related signal is poor in the proximal arch vessels, likely from technical factors. There is a common origin of the brachiocephalic and left common carotid arteries, a normal variant. Aside from poor assessment of the proximal right greater than left common carotid arteries, aside from poor evaluation proximally, the common carotid arteries appear widely patent. No significant ICA stenosis is identified, with evidence of less than 25% narrowing of the left ICA origin. Antegrade flow is visualized in both vertebral arteries. The right vertebral artery is strongly dominant without evidence of significant stenosis allowing for nondiagnostic assessment of its origin. The left vertebral artery origin and much of the V1 segment are not evaluated. The left V2 segment is patent but diffusely small in caliber with apparent multifocal stenoses which may reflect a combination of atherosclerosis and artifact. IMPRESSION: 1. No acute intracranial abnormality. 2. 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 3. Patent circle of Willis without large vessel occlusion or high-grade proximal intracranial stenosis. 4. 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 5.  Unchanged 2 mm right M1 aneurysm. 6. Limited neck MRA due to motion and noncontrast technique. 7. No evidence of significant carotid or right vertebral artery stenosis in the neck. 8. Diminutive left vertebral artery with multifocal stenosis. Electronically Signed   By: Logan Bores M.D.   On: 11/25/2017 12:48   Mr Brain Wo Contrast  Result Date: 11/25/2017 CLINICAL DATA:  Acute left leg weakness. Prior stroke with chronic right sided weakness. EXAM: MRI HEAD WITHOUT CONTRAST MRA HEAD WITHOUT CONTRAST MRA NECK WITHOUT CONTRAST TECHNIQUE: Multiplanar, multiecho pulse sequences of the brain and surrounding structures were obtained without intravenous contrast. Angiographic images of the Circle of Willis were obtained using MRA technique without intravenous contrast. Angiographic images of the neck were obtained using MRA technique without intravenous contrast. Carotid stenosis measurements (when applicable) are obtained utilizing NASCET criteria, using the distal internal carotid diameter as the denominator. COMPARISON:  Head CT 11/24/2017.  Head MRI/MRA 04/19/2017. FINDINGS: MRI HEAD FINDINGS Brain: There is no evidence of acute infarct, mass, midline shift, or extra-axial fluid collection. Numerous chronic microhemorrhages are seen in the cerebellum, brainstem, and both cerebral hemispheres including deep gray nuclei and may reflect chronic hypertension. A greater number of microhemorrhages are shown on the current examination compared to the prior MRI which is likely predominantly due to differences in imaging technique. Chronic small infarcts are again seen in the cerebellum, brainstem, deep gray nuclei, and cerebral white matter bilaterally. Patchy to confluent cerebral white matter T2 hyperintensities are nonspecific but compatible with extensive chronic small vessel ischemic disease. Chronic small vessel ischemia has overall progressed from the prior MRI. Wallerian degeneration is noted in the left  cerebral peduncle. Mild cerebral atrophy is within normal limits for age. Vascular: Abnormal appearance of the distal left vertebral artery, more fully evaluated below. Other major intracranial vascular flow voids are preserved. Skull and upper cervical spine: No suspicious marrow lesion. Disc degeneration at C4-5 greater than C3-4. Sinuses/Orbits: Bilateral cataract extraction. Paranasal sinuses and mastoid air cells are clear. Other: None. MRA HEAD FINDINGS The intracranial vertebral arteries are patent with the right being strongly dominant. There is mild to moderate multifocal left V4 stenosis. Patent right PICA, bilateral AICA, and bilateral SCA origins are identified. The basilar artery is mildly small in caliber diffusely without significant focal stenosis. There are large posterior communicating arteries bilaterally with mild hypoplasia of the left P1 segment. Both PCAs are patent without evidence of significant proximal stenosis. The internal carotid arteries are widely patent from skull base to carotid termini. 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 ACAs and MCAs are patent without evidence of flow limiting proximal stenosis. The right A1 segment is again noted to be mildly hypoplastic. There is some asymmetric attenuation of left MCA branch vessels compared to the right. A 2 mm superiorly directed outpouching from the right M1 segment is unchanged and suggestive of a small aneurysm without a vessel seen arising from it to support an alternative diagnosis of infundibulum. MRA NECK FINDINGS Assessment is limited by noncontrast technique and motion artifact. Flow related signal is poor in the proximal arch vessels, likely from technical factors. There is a common origin of the brachiocephalic and left common carotid arteries, a normal variant. Aside from poor assessment of the proximal right greater than left common carotid arteries,  aside from poor evaluation proximally, the common carotid arteries appear widely patent. No significant ICA stenosis is identified, with evidence of less than  25% narrowing of the left ICA origin. Antegrade flow is visualized in both vertebral arteries. The right vertebral artery is strongly dominant without evidence of significant stenosis allowing for nondiagnostic assessment of its origin. The left vertebral artery origin and much of the V1 segment are not evaluated. The left V2 segment is patent but diffusely small in caliber with apparent multifocal stenoses which may reflect a combination of atherosclerosis and artifact. IMPRESSION: 1. No acute intracranial abnormality. 2. 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 3. Patent circle of Willis without large vessel occlusion or high-grade proximal intracranial stenosis. 4. 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 5. Unchanged 2 mm right M1 aneurysm. 6. Limited neck MRA due to motion and noncontrast technique. 7. No evidence of significant carotid or right vertebral artery stenosis in the neck. 8. Diminutive left vertebral artery with multifocal stenosis. Electronically Signed   By: Logan Bores M.D.   On: 11/25/2017 12:48   Dg Hip Unilat With Pelvis 2-3 Views Left  Result Date: 11/24/2017 CLINICAL DATA:  [place] today.  Left-sided hip pain. EXAM: DG HIP (WITH OR WITHOUT PELVIS) 2-3V LEFT COMPARISON:  None. FINDINGS: No evidence of acute fracture. There are chronic degenerative or post traumatic findings of the symphysis pubis region. No evidence of hip or proximal femur fracture. IMPRESSION: No acute finding. Electronically Signed   By: Nelson Chimes M.D.   On: 11/24/2017 16:05   Dg Femur 1v Left  Result Date: 11/24/2017 CLINICAL DATA:  [place].  Left femur pain. EXAM: LEFT FEMUR 1 VIEW COMPARISON:  Earlier same day FINDINGS: No evidence of  fracture.  Chronic osteoarthritis of the knee. IMPRESSION: Negative. Electronically Signed   By: Nelson Chimes M.D.   On: 11/24/2017 16:06     Subjective: No complaints at this time, tolerating oral diet.  General = no fevers, chills, dizziness, malaise, fatigue HEENT/EYES = negative for pain, redness, loss of vision, double vision, blurred vision, loss of hearing, sore throat, hoarseness, dysphagia Cardiovascular= negative for chest pain, palpitation, murmurs, lower extremity swelling Respiratory/lungs= negative for shortness of breath, cough, hemoptysis, wheezing, mucus production Gastrointestinal= negative for nausea, vomiting,, abdominal pain, melena, hematemesis Genitourinary= negative for Dysuria, Hematuria, Change in Urinary Frequency MSK = Negative for arthralgia, myalgias, Back Pain, Joint swelling  Neurology= Negative for headache, seizures, numbness, tingling  Psychiatry= Negative for anxiety, depression, suicidal and homocidal ideation Allergy/Immunology= Medication/Food allergy as listed  Skin= Negative for Rash, lesions, ulcers, itching   Discharge Exam: Vitals:   11/29/17 0747 11/29/17 1130  BP: (!) 158/99 (!) 174/97  Pulse: 85 89  Resp: 16 20  Temp: 98.7 F (37.1 C) 98.6 F (37 C)  SpO2: 97% 98%   Vitals:   11/28/17 2341 11/29/17 0401 11/29/17 0747 11/29/17 1130  BP: 116/73 (!) 150/82 (!) 158/99 (!) 174/97  Pulse: 88 81 85 89  Resp: 18 18 16 20   Temp: 98 F (36.7 C) 98.1 F (36.7 C) 98.7 F (37.1 C) 98.6 F (37 C)  TempSrc: Oral Oral Oral Oral  SpO2: 98% 98% 97% 98%    General: Pt is alert, awake, not in acute distress; poor dentition with missing teeth Cardiovascular: RRR, S1/S2 +, no rubs, no gallops Respiratory: CTA bilaterally, no wheezing, no rhonchi Abdominal: Soft, NT, ND, bowel sounds + Extremities: no edema, no cyanosis    The results of significant diagnostics from this hospitalization (including imaging, microbiology, ancillary and  laboratory) are listed  below for reference.     Microbiology: Recent Results (from the past 240 hour(s))  Urine culture     Status: Abnormal   Collection Time: 11/24/17  6:30 PM  Result Value Ref Range Status   Specimen Description URINE, CLEAN CATCH  Final   Special Requests   Final    NONE Performed at [facility], [address] [place], [place] [address]    Culture (A)  Final    >=100,000 COLONIES/mL ESCHERICHIA COLI Confirmed Extended Spectrum Beta-Lactamase Producer (ESBL).  In bloodstream infections from ESBL organisms, carbapenems are preferred over piperacillin/tazobactam. They are shown to have a lower risk of mortality.    Report Status 11/27/2017 FINAL  Final   Organism ID, Bacteria ESCHERICHIA COLI (A)  Final      Susceptibility   Escherichia coli - MIC*    AMPICILLIN >=32 RESISTANT Resistant     CEFAZOLIN >=64 RESISTANT Resistant     CEFTRIAXONE >=64 RESISTANT Resistant     CIPROFLOXACIN 1 SENSITIVE Sensitive     GENTAMICIN >=16 RESISTANT Resistant     IMIPENEM <=0.25 SENSITIVE Sensitive     NITROFURANTOIN <=16 SENSITIVE Sensitive     TRIMETH/SULFA >=320 RESISTANT Resistant     AMPICILLIN/SULBACTAM >=32 RESISTANT Resistant     PIP/TAZO <=4 SENSITIVE Sensitive     Extended ESBL POSITIVE Resistant     * >=100,000 COLONIES/mL ESCHERICHIA COLI  Blood culture (routine x 2)     Status: None (Preliminary result)   Collection Time: 11/24/17 11:39 PM  Result Value Ref Range Status   Specimen Description BLOOD RIGHT ANTECUBITAL  Final   Special Requests   Final    BOTTLES DRAWN AEROBIC AND ANAEROBIC Blood Culture adequate volume   Culture   Final    NO GROWTH 4 DAYS Performed at [facility], [address], [place], [place] [address]    Report Status PENDING  Incomplete  Blood culture (routine x 2)     Status: None (Preliminary result)   Collection Time: 11/24/17 11:50 PM  Result Value Ref Range Status   Specimen Description BLOOD LEFT ANTECUBITAL   Final   Special Requests   Final    BOTTLES DRAWN AEROBIC AND ANAEROBIC Blood Culture adequate volume   Culture   Final    NO GROWTH 4 DAYS Performed at [facility], [place] [address] [place], [place] [address]    Report Status PENDING  Incomplete  Urine culture     Status: Abnormal   Collection Time: 11/26/17  2:53 AM  Result Value Ref Range Status   Specimen Description URINE, CLEAN CATCH  Final   Special Requests NONE  Final   Culture (A)  Final    <10,000 COLONIES/mL INSIGNIFICANT GROWTH Performed at [facility], [place] [address] [place], [place] [address]    Report Status 11/27/2017 FINAL  Final     Labs: BNP (last 3 results) Recent Labs    03/16/17 1825  BNP 9.3   Basic Metabolic Panel: Recent Labs  Lab 11/23/17 1033  11/24/17 1259 11/24/17 1311 11/25/17 1237 11/26/17 0323 11/27/17 0456 11/28/17 0945 11/29/17 0432  NA  --    < > 137 137  --  138 139 137 138  K  --    < > 4.1 4.1  --  4.1 3.9 3.9 3.9  CL  --    < > 103 101  --  103 106 104 107  CO2  --   --  26  --   --  25 26 21* 22  GLUCOSE  --    < > 131* 126*  --  98 108* 122* 96  BUN  --    < > 23 26*  --  22 25* 21 21  CREATININE  --    < > 1.53* 1.50* 1.36* 1.52* 1.54* 1.40* 1.39*  CALCIUM  --   --  10.2  --   --  9.9 9.9 10.1 10.0  MG 1.8  --   --   --  1.8  --   --   --   --   PHOS  --   --   --   --  3.2  --   --   --   --    < > = values in this interval not displayed.   Liver Function Tests: Recent Labs  Lab 11/24/17 1259 11/26/17 0323 11/28/17 0945 11/29/17 0432  AST 17 15 20 17   ALT 14 12 12 13   ALKPHOS 98 86 91 88  BILITOT 0.6 0.6 0.5 0.5  PROT 6.2* 5.9* 6.0* 5.8*  ALBUMIN 3.5 3.1* 3.3* 3.2*   No results for input(s): LIPASE, AMYLASE in the last 168 hours. No results for input(s): AMMONIA in the last 168 hours. CBC: Recent Labs  Lab 11/24/17 1259 11/24/17 1311 11/25/17 1237  WBC 4.7  --  5.0  NEUTROABS 3.2  --   --   HGB 11.4* 12.6 13.2  HCT 38.1 37.0 42.4  MCV  86.2  --  85.0  PLT 234  --  233   Cardiac Enzymes: Recent Labs  Lab 11/28/17 0945  CKTOTAL 38   BNP: Invalid input(s): POCBNP CBG: Recent Labs  Lab 11/24/17 1430  GLUCAP 95   D-Dimer No results for input(s): DDIMER in the last 72 hours. Hgb A1c No results for input(s): HGBA1C in the last 72 hours. Lipid Profile No results for input(s): CHOL, HDL, LDLCALC, TRIG, CHOLHDL, LDLDIRECT in the last 72 hours. Thyroid function studies No results for input(s): TSH, T4TOTAL, T3FREE, THYROIDAB in the last 72 hours.  Invalid input(s): FREET3 Anemia work up No results for input(s): VITAMINB12, FOLATE, FERRITIN, TIBC, IRON, RETICCTPCT in the last 72 hours. Urinalysis    Component Value Date/Time   COLORURINE YELLOW 11/24/2017 1830   APPEARANCEUR CLEAR 11/24/2017 1830   LABSPEC 1.014 11/24/2017 1830   PHURINE 6.0 11/24/2017 1830   GLUCOSEU NEGATIVE 11/24/2017 1830   HGBUR NEGATIVE 11/24/2017 1830   BILIRUBINUR NEGATIVE 11/24/2017 1830   KETONESUR NEGATIVE 11/24/2017 1830   PROTEINUR NEGATIVE 11/24/2017 1830   NITRITE POSITIVE (A) 11/24/2017 1830   LEUKOCYTESUR NEGATIVE 11/24/2017 1830   Sepsis Labs Invalid input(s): PROCALCITONIN,  WBC,  LACTICIDVEN Microbiology Recent Results (from the past 240 hour(s))  Urine culture     Status: Abnormal   Collection Time: 11/24/17  6:30 PM  Result Value Ref Range Status   Specimen Description URINE, CLEAN CATCH  Final   Special Requests   Final    NONE Performed at [facility], [place] [address] [place], [place] [address]    Culture (A)  Final    >=100,000 COLONIES/mL ESCHERICHIA COLI Confirmed Extended Spectrum Beta-Lactamase Producer (ESBL).  In bloodstream infections from ESBL organisms, carbapenems are preferred over piperacillin/tazobactam. They are shown to have a lower risk of mortality.    Report Status 11/27/2017 FINAL  Final   Organism ID, Bacteria ESCHERICHIA COLI (A)  Final      Susceptibility  Escherichia coli -  MIC*    AMPICILLIN >=32 RESISTANT Resistant     CEFAZOLIN >=64 RESISTANT Resistant     CEFTRIAXONE >=64 RESISTANT Resistant     CIPROFLOXACIN 1 SENSITIVE Sensitive     GENTAMICIN >=16 RESISTANT Resistant     IMIPENEM <=0.25 SENSITIVE Sensitive     NITROFURANTOIN <=16 SENSITIVE Sensitive     TRIMETH/SULFA >=320 RESISTANT Resistant     AMPICILLIN/SULBACTAM >=32 RESISTANT Resistant     PIP/TAZO <=4 SENSITIVE Sensitive     Extended ESBL POSITIVE Resistant     * >=100,000 COLONIES/mL ESCHERICHIA COLI  Blood culture (routine x 2)     Status: None (Preliminary result)   Collection Time: 11/24/17 11:39 PM  Result Value Ref Range Status   Specimen Description BLOOD RIGHT ANTECUBITAL  Final   Special Requests   Final    BOTTLES DRAWN AEROBIC AND ANAEROBIC Blood Culture adequate volume   Culture   Final    NO GROWTH 4 DAYS Performed at [facility], [address], [place], [place] [address]    Report Status PENDING  Incomplete  Blood culture (routine x 2)     Status: None (Preliminary result)   Collection Time: 11/24/17 11:50 PM  Result Value Ref Range Status   Specimen Description BLOOD LEFT ANTECUBITAL  Final   Special Requests   Final    BOTTLES DRAWN AEROBIC AND ANAEROBIC Blood Culture adequate volume   Culture   Final    NO GROWTH 4 DAYS Performed at [facility], [place] [address], [place], [place] [address]    Report Status PENDING  Incomplete  Urine culture     Status: Abnormal   Collection Time: 11/26/17  2:53 AM  Result Value Ref Range Status   Specimen Description URINE, CLEAN CATCH  Final   Special Requests NONE  Final   Culture (A)  Final    <10,000 COLONIES/mL INSIGNIFICANT GROWTH Performed at [facility], [place] [address] [place],  [address]    Report Status 11/27/2017 FINAL  Final     Time coordinating discharge:  I have spent 35 minutes face to face with the patient and on the ward discussing the patients care, assessment, plan and  disposition with other care givers. >50% of the time was devoted counseling the patient about the risks and benefits of treatment/Discharge disposition and coordinating care.   SIGNED:   Damita Lack, MD  [facility] 11/29/2017, 11:33 AM Pager   If 7PM-7AM, please contact night-coverage [email or web address] Password [email or web address]

## 2017-11-29 NOTE — Telephone Encounter (Signed)
[name] with [facility] called yesterday to speak about Tara Dunn.  I called [name] back and left a message that [name] is currently in the hospital.

## 2017-11-30 DIAGNOSIS — I5042 Chronic combined systolic (congestive) and diastolic (congestive) heart failure: Secondary | ICD-10-CM | POA: Diagnosis not present

## 2017-11-30 DIAGNOSIS — I10 Essential (primary) hypertension: Secondary | ICD-10-CM | POA: Diagnosis not present

## 2017-11-30 DIAGNOSIS — I69828 Other speech and language deficits following other cerebrovascular disease: Secondary | ICD-10-CM | POA: Diagnosis not present

## 2017-11-30 DIAGNOSIS — I129 Hypertensive chronic kidney disease with stage 1 through stage 4 chronic kidney disease, or unspecified chronic kidney disease: Secondary | ICD-10-CM | POA: Diagnosis not present

## 2017-11-30 DIAGNOSIS — I69311 Memory deficit following cerebral infarction: Secondary | ICD-10-CM | POA: Diagnosis not present

## 2017-11-30 DIAGNOSIS — I429 Cardiomyopathy, unspecified: Secondary | ICD-10-CM | POA: Diagnosis not present

## 2017-11-30 DIAGNOSIS — I63312 Cerebral infarction due to thrombosis of left middle cerebral artery: Secondary | ICD-10-CM | POA: Diagnosis not present

## 2017-11-30 DIAGNOSIS — R531 Weakness: Secondary | ICD-10-CM | POA: Diagnosis not present

## 2017-11-30 DIAGNOSIS — I25118 Atherosclerotic heart disease of native coronary artery with other forms of angina pectoris: Secondary | ICD-10-CM | POA: Diagnosis not present

## 2017-11-30 DIAGNOSIS — I69391 Dysphagia following cerebral infarction: Secondary | ICD-10-CM | POA: Diagnosis not present

## 2017-11-30 DIAGNOSIS — R29898 Other symptoms and signs involving the musculoskeletal system: Secondary | ICD-10-CM | POA: Diagnosis not present

## 2017-11-30 DIAGNOSIS — R2681 Unsteadiness on feet: Secondary | ICD-10-CM | POA: Diagnosis not present

## 2017-11-30 DIAGNOSIS — R296 Repeated falls: Secondary | ICD-10-CM | POA: Diagnosis not present

## 2017-11-30 DIAGNOSIS — N183 Chronic kidney disease, stage 3 (moderate): Secondary | ICD-10-CM | POA: Diagnosis not present

## 2017-11-30 DIAGNOSIS — E669 Obesity, unspecified: Secondary | ICD-10-CM | POA: Diagnosis not present

## 2017-11-30 DIAGNOSIS — I69351 Hemiplegia and hemiparesis following cerebral infarction affecting right dominant side: Secondary | ICD-10-CM | POA: Diagnosis not present

## 2017-11-30 DIAGNOSIS — G4733 Obstructive sleep apnea (adult) (pediatric): Secondary | ICD-10-CM | POA: Diagnosis not present

## 2017-11-30 DIAGNOSIS — Z9181 History of falling: Secondary | ICD-10-CM | POA: Diagnosis not present

## 2017-11-30 DIAGNOSIS — I69320 Aphasia following cerebral infarction: Secondary | ICD-10-CM | POA: Diagnosis not present

## 2017-11-30 DIAGNOSIS — E785 Hyperlipidemia, unspecified: Secondary | ICD-10-CM | POA: Diagnosis not present

## 2017-11-30 DIAGNOSIS — G8191 Hemiplegia, unspecified affecting right dominant side: Secondary | ICD-10-CM | POA: Diagnosis not present

## 2017-11-30 LAB — CULTURE, BLOOD (ROUTINE X 2)
CULTURE: NO GROWTH
CULTURE: NO GROWTH
SPECIAL REQUESTS: ADEQUATE
SPECIAL REQUESTS: ADEQUATE

## 2017-11-30 NOTE — Progress Notes (Signed)
Patient discharged to [facility]. Report called.

## 2017-11-30 NOTE — Progress Notes (Signed)
PROGRESS NOTE    Tara Dunn  JJO:[id number] DOB: July 20, 1940 DOA: 11/24/2017 PCP: Fanny Bien, MD   Brief Narrative:  77 year old with history of recurrent CVA, residual right-sided hemiparesis, essential hypertension, hyperlipidemia, obesity, obstructive sleep apnea, chronic kidney disease stage III, diastolic congestive heart failure came to the hospital with complains of left lower extremity weakness.  Patient CTA of the head was negative.  MRI of the brain showed severe chronic small vessel ischemic disease with numerous chronic lacunar infarct and chronic microhemorrhages.  No other acute findings were noted.  She was evaluated physical therapy who recommended skilled nursing facility.  Assessment & Plan:   Principal Problem:   Left leg weakness Active Problems:   Chronic combined systolic and diastolic CHF (congestive heart failure) (HCC)   CKD (chronic kidney disease), stage III (HCC)   Essential hypertension   OSA (obstructive sleep apnea)   Obesity (BMI 30-39.9)   Stage 3 chronic kidney disease (HCC)   Left lower extremity weakness/ambulatory dysfunction; persist.  History of CVA with residual right-sided hemiparesis - CT of the head was negative for any acute finding, MRI of the brain showed chronic severe small blood vessel changes with chronic lacunar infarcts.  PT/OT recommended skilled nursing facility therefore arrangements to be made. - Limited medical interventions available at this time.  Continue home regimen including Plavix.  Chronic combined systolic and diastolic congestive heart failure, ejection fraction 16%, grade 1 diastolic dysfunction - Patient currently appears to be euvolemic and compensated.  Resume home medications-Lipitor 80 mg daily, Coreg 25 mg, BiDil  Essential hypertension -Continue Coreg 25 mg twice daily  Hyperlipidemia -Lipitor 80 mg daily  Chronic kidney disease stage III  DVT prophylaxis: Subcu heparin while here Code  Status: Full code Family Communication: Spoke with the patient's daughter over the phone this morning Disposition Plan: Discharge today   Subjective: No complaints, feeling better.  At her request I spoke with the patient's daughter over the phone in the room and answered all the questions.  Review of Systems Otherwise negative except as per HPI, including: General: Denies fever, chills, night sweats or unintended weight loss. Resp: Denies cough, wheezing, shortness of breath. Cardiac: Denies chest pain, palpitations, orthopnea, paroxysmal nocturnal dyspnea. GI: Denies abdominal pain, nausea, vomiting, diarrhea or constipation GU: Denies dysuria, frequency, hesitancy or incontinence MS: Denies muscle aches, joint pain or swelling Neuro: Denies headache, neurologic deficits (focal weakness, numbness, tingling), abnormal gait Psych: Denies anxiety, depression, SI/HI/AVH Skin: Denies new rashes or lesions ID: Denies sick contacts, exotic exposures, travel  Objective: Vitals:   11/29/17 2147 11/29/17 2334 11/30/17 0457 11/30/17 0728  BP: (!) 155/80 (!) 143/69 (!) 147/80 (!) 146/87  Pulse: 74 76 74 76  Resp:  18 18 18   Temp: 98.5 F (36.9 C) 98.4 F (36.9 C) 97.8 F (36.6 C) 98.6 F (37 C)  TempSrc: Oral Oral Oral Oral  SpO2: 98% 95% 95% 95%    Intake/Output Summary (Last 24 hours) at 11/30/2017 1120 Last data filed at 11/29/2017 1805 Gross per 24 hour  Intake 240 ml  Output -  Net 240 ml   There were no vitals filed for this visit.  Examination:  General exam: Appears calm and comfortable  Respiratory system: Clear to auscultation. Respiratory effort normal. Cardiovascular system: S1 & S2 heard, RRR. No JVD, murmurs, rubs, gallops or clicks. No pedal edema. Gastrointestinal system: Abdomen is nondistended, soft and nontender. No organomegaly or masses felt. Normal bowel sounds heard. Central nervous system: Alert and oriented. No  focal neurological  deficits. Extremities: Symmetric 4 x 5 power. Skin: No rashes, lesions or ulcers Psychiatry: Judgement and insight appear normal. Mood & affect appropriate.     Data Reviewed:   CBC: Recent Labs  Lab 11/24/17 1259 11/24/17 1311 11/25/17 1237  WBC 4.7  --  5.0  NEUTROABS 3.2  --   --   HGB 11.4* 12.6 13.2  HCT 38.1 37.0 42.4  MCV 86.2  --  85.0  PLT 234  --  841   Basic Metabolic Panel: Recent Labs  Lab 11/24/17 1259 11/24/17 1311 11/25/17 1237 11/26/17 0323 11/27/17 0456 11/28/17 0945 11/29/17 0432  NA 137 137  --  138 139 137 138  K 4.1 4.1  --  4.1 3.9 3.9 3.9  CL 103 101  --  103 106 104 107  CO2 26  --   --  25 26 21* 22  GLUCOSE 131* 126*  --  98 108* 122* 96  BUN 23 26*  --  22 25* 21 21  CREATININE 1.53* 1.50* 1.36* 1.52* 1.54* 1.40* 1.39*  CALCIUM 10.2  --   --  9.9 9.9 10.1 10.0  MG  --   --  1.8  --   --   --   --   PHOS  --   --  3.2  --   --   --   --    GFR: CrCl cannot be calculated (Unknown ideal weight.). Liver Function Tests: Recent Labs  Lab 11/24/17 1259 11/26/17 0323 11/28/17 0945 11/29/17 0432  AST 17 15 20 17   ALT 14 12 12 13   ALKPHOS 98 86 91 88  BILITOT 0.6 0.6 0.5 0.5  PROT 6.2* 5.9* 6.0* 5.8*  ALBUMIN 3.5 3.1* 3.3* 3.2*   No results for input(s): LIPASE, AMYLASE in the last 168 hours. No results for input(s): AMMONIA in the last 168 hours. Coagulation Profile: Recent Labs  Lab 11/24/17 1259  INR 1.06   Cardiac Enzymes: Recent Labs  Lab 11/28/17 0945  CKTOTAL 38   BNP (last 3 results) No results for input(s): PROBNP in the last 8760 hours. HbA1C: No results for input(s): HGBA1C in the last 72 hours. CBG: Recent Labs  Lab 11/24/17 1430  GLUCAP 95   Lipid Profile: No results for input(s): CHOL, HDL, LDLCALC, TRIG, CHOLHDL, LDLDIRECT in the last 72 hours. Thyroid Function Tests: No results for input(s): TSH, T4TOTAL, FREET4, T3FREE, THYROIDAB in the last 72 hours. Anemia Panel: No results for input(s):  VITAMINB12, FOLATE, FERRITIN, TIBC, IRON, RETICCTPCT in the last 72 hours. Sepsis Labs: No results for input(s): PROCALCITON, LATICACIDVEN in the last 168 hours.  Recent Results (from the past 240 hour(s))  Urine culture     Status: Abnormal   Collection Time: 11/24/17  6:30 PM  Result Value Ref Range Status   Specimen Description URINE, CLEAN CATCH  Final   Special Requests   Final    NONE Performed at [facility], [address] [place], [place] [address]    Culture (A)  Final    >=100,000 COLONIES/mL ESCHERICHIA COLI Confirmed Extended Spectrum Beta-Lactamase Producer (ESBL).  In bloodstream infections from ESBL organisms, carbapenems are preferred over piperacillin/tazobactam. They are shown to have a lower risk of mortality.    Report Status 11/27/2017 FINAL  Final   Organism ID, Bacteria ESCHERICHIA COLI (A)  Final      Susceptibility   Escherichia coli - MIC*    AMPICILLIN >=32 RESISTANT Resistant  CEFAZOLIN >=64 RESISTANT Resistant     CEFTRIAXONE >=64 RESISTANT Resistant     CIPROFLOXACIN 1 SENSITIVE Sensitive     GENTAMICIN >=16 RESISTANT Resistant     IMIPENEM <=0.25 SENSITIVE Sensitive     NITROFURANTOIN <=16 SENSITIVE Sensitive     TRIMETH/SULFA >=320 RESISTANT Resistant     AMPICILLIN/SULBACTAM >=32 RESISTANT Resistant     PIP/TAZO <=4 SENSITIVE Sensitive     Extended ESBL POSITIVE Resistant     * >=100,000 COLONIES/mL ESCHERICHIA COLI  Blood culture (routine x 2)     Status: None   Collection Time: 11/24/17 11:39 PM  Result Value Ref Range Status   Specimen Description BLOOD RIGHT ANTECUBITAL  Final   Special Requests   Final    BOTTLES DRAWN AEROBIC AND ANAEROBIC Blood Culture adequate volume   Culture   Final    NO GROWTH 5 DAYS Performed at [facility], [place] [address], [place], [place] [address]    Report Status 11/30/2017 FINAL  Final  Blood culture (routine x 2)     Status: None   Collection Time: 11/24/17 11:50 PM  Result Value Ref  Range Status   Specimen Description BLOOD LEFT ANTECUBITAL  Final   Special Requests   Final    BOTTLES DRAWN AEROBIC AND ANAEROBIC Blood Culture adequate volume   Culture   Final    NO GROWTH 5 DAYS Performed at [facility], [place] [address] [place], [place] [address]    Report Status 11/30/2017 FINAL  Final  Urine culture     Status: Abnormal   Collection Time: 11/26/17  2:53 AM  Result Value Ref Range Status   Specimen Description URINE, CLEAN CATCH  Final   Special Requests NONE  Final   Culture (A)  Final    <10,000 COLONIES/mL INSIGNIFICANT GROWTH Performed at [facility], [place] [address], [place], [place] [address]    Report Status 11/27/2017 FINAL  Final         Radiology Studies: No results found.      Scheduled Meds: . allopurinol  200 mg Oral Daily  . atorvastatin  80 mg Oral Daily  . carvedilol  25 mg Oral BID WC  . clopidogrel  75 mg Oral Daily  . heparin  5,000 Units Subcutaneous Q8H  . isosorbide-hydrALAZINE  2 tablet Oral BID   Continuous Infusions:   LOS: 0 days   Time spent= 20 mins    Sahvannah Rieser Arsenio Loader, MD [facility] Pager [phone]   If 7PM-7AM, please contact night-coverage [email or web address] Password [email or web address] 11/30/2017, 11:20 AM

## 2017-11-30 NOTE — Consult Note (Addendum)
   [facility] CM Inpatient Consult   11/30/2017  Tara Dunn 02-21-1940 [id number]    [facility] Care Management follow up.  Chart reviewed. Noted [name] discharged to [facility]. Will alert [place] Acute Coordinator.  Please refer to hospital liaison note from 11/28/17 for [facility] hospital liaison bedside encounter.  Marthenia Rolling, MSN-Ed, RN,BSN [facility] Liaison [phone]

## 2017-11-30 NOTE — Clinical Social Work Placement (Signed)
Nurse to call report to [phone], Room [place]  NOTE  Date:  11/30/2017  Patient Details  Name: Tara Dunn MRN: [id number] Date of Birth: 1940/11/27  Clinical Social Work is seeking post-discharge placement for this patient at the [place] level of care (*CSW will initial, date and re-position this form in  chart as items are completed):  Yes   Patient/family provided with [place] Work Department's list of facilities offering this level of care within the geographic area requested by the patient (or if unable, by the patient's family).  Yes   Patient/family informed of their freedom to choose among providers that offer the needed level of care, that participate in Medicare, Medicaid or managed care program needed by the patient, have an available bed and are willing to accept the patient.  Yes   Patient/family informed of [facility]'s ownership interest in [facility] and [facility], as well as of the fact that they are under no obligation to receive care at these facilities.  PASRR submitted to EDS on 11/26/17     PASRR number received on 11/26/17     Existing PASRR number confirmed on       FL2 transmitted to all facilities in geographic area requested by pt/family on 11/26/17     FL2 transmitted to all facilities within larger geographic area on       Patient informed that his/her managed care company has contracts with or will negotiate with certain facilities, including the following:        Yes   Patient/family informed of bed offers received.  Patient chooses bed at [facility]     Physician recommends and patient chooses bed at      Patient to be transferred to [facility] on 11/30/17.  Patient to be transferred to facility by [facility]     Patient family notified on 11/30/17 of transfer.  Name of family member notified:  [name], daughter      PHYSICIAN       Additional Comment:    _______________________________________________ Geralynn Ochs, LCSW 11/30/2017, 12:11 PM

## 2017-11-30 NOTE — Care Management Obs Status (Signed)
[facility] NOTIFICATION   Patient Details  Name: Jennafer Gladue MRN: [id number] Date of Birth: 06/14/1940   Medicare Observation Status Notification Given:  Yes    Pollie Friar, RN 11/30/2017, 11:57 AM

## 2017-12-01 ENCOUNTER — Non-Acute Institutional Stay (SKILLED_NURSING_FACILITY): Payer: PPO | Admitting: Internal Medicine

## 2017-12-01 ENCOUNTER — Encounter: Payer: Self-pay | Admitting: Internal Medicine

## 2017-12-01 DIAGNOSIS — I10 Essential (primary) hypertension: Secondary | ICD-10-CM | POA: Diagnosis not present

## 2017-12-01 DIAGNOSIS — R29898 Other symptoms and signs involving the musculoskeletal system: Secondary | ICD-10-CM

## 2017-12-01 DIAGNOSIS — I25118 Atherosclerotic heart disease of native coronary artery with other forms of angina pectoris: Secondary | ICD-10-CM

## 2017-12-01 DIAGNOSIS — N183 Chronic kidney disease, stage 3 unspecified: Secondary | ICD-10-CM

## 2017-12-01 DIAGNOSIS — G4733 Obstructive sleep apnea (adult) (pediatric): Secondary | ICD-10-CM

## 2017-12-01 DIAGNOSIS — I5042 Chronic combined systolic (congestive) and diastolic (congestive) heart failure: Secondary | ICD-10-CM | POA: Diagnosis not present

## 2017-12-01 NOTE — Progress Notes (Signed)
:   Location:  [facility] Room Number: 106P Place of Service:  SNF (31)  Avelardo Reesman D. Sheppard Coil, MD  Patient Care Team: Fanny Bien, MD as PCP - General [facility])  Extended Emergency Contact Information Primary Emergency Contact: Loura Pardon [place] Phone: [phone] Mobile Phone: [phone] Relation: Daughter     Allergies: Shellfish allergy  Chief Complaint  Patient presents with  . New Admit To SNF    Admit to [facility]    HPI: Patient is 77 y.o. female with history of recurrent CVA, residual right-sided hemiparesis, essential hypertension, hyperlipidemia, obesity, OSA, CKD stage III, diastolic congestive heart failure came to the hospital with complaints of left lower extremity weakness.  Patient CTA of the head was negative, MRI of the brain showed severe chronic small vessel ischemic disease with numerous chronic lacunar infarcts and chronic microhemorrhages.  No other acute findings were noted.  Patient was admitted to [facility] from 10/17-22.  She was evaluated by physical therapy who recommended skilled nursing facility.  Patient is admitted to skilled nursing facility for OT/PT.  While at skilled nursing facility she will be followed for coronary artery disease treated with Coreg Plavix BiDil and Lipitor, hyperlipidemia treated with Lipitor and hypertension treated with Coreg Lasix and BiDil.  Past Medical History:  Diagnosis Date  . 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 Lovena Le  . Aortic dilatation ([place])    a. 04/2014 CTA chest w/ incidental finding of distal thoracic Ao enlargement of 3.39 mm - f/u needed 04/2015.  . Arthritis    "all over my body"  . Cancer of left breast ([place]) 2009   s/p L mastectomy and chemo.  . Cardiomyopathy ([place])    a. 04/2014 Echo: EF 25-30%, mod conc LVH, possibl antsept HK, Gr1 DD, mod-sev dil LA.  . CHF (congestive  heart failure) ([place])   . CKD (chronic kidney disease), stage III ([place])   . Depression   . GERD (gastroesophageal reflux disease)   . Gout   . Heart murmur   . History of stomach ulcers   . Hypertension    a. Dx @ age 49;  b. 04/2014 admission for HTN emergency.  . Obesity (BMI 30-39.9) 01/21/2015  . OSA on CPAP   . Stroke [facility])    a. 3 strokes - last in 1998; "memory problems since"  (03/26/2015)    Past Surgical History:  Procedure Laterality Date  . ABDOMINAL HYSTERECTOMY    . BREAST BIOPSY Left 2008  . CATARACT EXTRACTION, BILATERAL Bilateral   . DILATION AND CURETTAGE OF UTERUS    . EP IMPLANTABLE DEVICE N/A 03/26/2015   MDT single chamber ICD, Dr. Lovena Le  . LEFT HEART CATHETERIZATION WITH CORONARY ANGIOGRAM N/A 05/07/2014   Procedure: LEFT HEART CATHETERIZATION WITH CORONARY ANGIOGRAM;  Surgeon: Belva Crome, MD;  Location: [facility] CATH LAB;  Service: Cardiovascular;  Laterality: N/A;  . MASTECTOMY Left 2009    Allergies as of 12/01/2017      Reactions   Shellfish Allergy Hives      Medication List        Accurate as of 12/01/17 10:36 AM. Always use your most recent med list.          ALKA-SELTZER ANTACID PO Take by mouth as needed.   atorvastatin 40 MG tablet Commonly known as:  LIPITOR Take 80 mg by mouth daily.   carvedilol 25 MG tablet Commonly known as:  COREG TAKE 1 TABLET(25 MG) BY MOUTH TWICE DAILY   clopidogrel 75 MG tablet Commonly known as:  PLAVIX Take 1 tablet (75 mg total) by mouth daily.   DULoxetine 30 MG capsule Commonly known as:  CYMBALTA Take 30 mg by mouth daily.   furosemide 20 MG tablet Commonly known as:  LASIX Take 2 tablets (40 mg total) by mouth daily. Can take extra 40 mg as needed   isosorbide-hydrALAZINE 20-37.5 MG tablet Commonly known as:  BIDIL Take 2 tablets by mouth 2 (two) times daily.   magnesium oxide 400 MG tablet Commonly known as:  MAG-OX Take 1 tablet (400 mg total) by mouth 2 (two) times daily.     omeprazole 20 MG capsule Commonly known as:  PRILOSEC Take 20 mg by mouth daily. Can also take two times daily as needed   potassium chloride SA 20 MEQ tablet Commonly known as:  K-DUR,KLOR-CON Take 20 mEq by mouth daily.       No orders of the defined types were placed in this encounter.    There is no immunization history on file for this patient.  Social History   Tobacco Use  . Smoking status: Former Smoker    Packs/day: 0.50    Years: 40.00    Pack years: 20.00    Types: Cigarettes  . Smokeless tobacco: Never Used  . Tobacco comment: Quit in 2009.  Substance Use Topics  . Alcohol use: No    Alcohol/week: 0.0 standard drinks    Comment: "drank occasionally when I was young; last drink was in the late 1990s"    Family history is   Family History  Problem Relation Age of Onset  . High blood pressure Mother        Died @ 5.  . High blood pressure Father   . Heart attack Father        Died in his early 56's.  . High blood pressure Sister   . Cancer Sister   . Cancer Daughter   . Heart disease Daughter       Review of Systems  DATA OBTAINED: from patient, nurse GENERAL:  no fevers, fatigue, appetite changes SKIN: No itching, or rash EYES: No eye pain, redness, discharge EARS: No earache, tinnitus, change in hearing NOSE: No congestion, drainage or bleeding  MOUTH/THROAT: No mouth or tooth pain, No sore throat RESPIRATORY: No cough, wheezing, SOB CARDIAC: No chest pain, palpitations, lower extremity edema  GI: No abdominal pain, No N/V/D or constipation, No heartburn or reflux  GU: No dysuria, frequency or urgency, or incontinence  MUSCULOSKELETAL: No unrelieved bone/joint pain NEUROLOGIC: No headache, dizziness or focal weakness PSYCHIATRIC: No c/o anxiety or sadness   Vitals:   12/01/17 1032  BP: 123/82  Pulse: 77  Resp: 18  Temp: (!) 97.4 F (36.3 C)    SpO2 Readings from Last 1 Encounters:  11/30/17 100%   Body mass index is 34.37  kg/m.     Physical Exam  GENERAL APPEARANCE: Alert, conversant,  No acute distress.  SKIN: No diaphoresis rash HEAD: Normocephalic, atraumatic  EYES: Conjunctiva/lids clear. Pupils round, reactive. EOMs intact.  EARS: External exam WNL, canals clear. Hearing grossly normal.  NOSE: No deformity or discharge.  MOUTH/THROAT: Lips w/o lesions  RESPIRATORY: Breathing is even, unlabored. Lung sounds are clear   CARDIOVASCULAR: Heart RRR no murmurs, rubs or gallops. No peripheral edema.   GASTROINTESTINAL: Abdomen is soft, non-tender, not distended w/ normal bowel sounds. GENITOURINARY: Bladder non tender, not distended  MUSCULOSKELETAL: No abnormal joints or musculature NEUROLOGIC:  Cranial nerves 2-12 grossly intact; mild left leg weakness, right side hemiparesis PSYCHIATRIC: Mood and affect appropriate to situation, no behavioral issues  Patient Active Problem List   Diagnosis Date Noted  . Acute CVA (cerebrovascular accident) ([place]) 11/25/2017  . Left leg weakness 11/24/2017  . Labile blood pressure   . Hypoalbuminemia due to protein-calorie malnutrition ([place])   . Acute blood loss anemia   . AKI (acute kidney injury) ([place])   . Peripheral edema   . Stage 3 chronic kidney disease ([place])   . Benign essential HTN   . History of CVA with residual deficit   . Cerebrovascular accident (CVA) due to thrombosis of left middle cerebral artery ([place])   . Right leg weakness 04/19/2017  . Right hemiparesis ([place]) 04/19/2017  . Thalamic infarct, acute ([place]) 04/19/2017  . Joint pain 12/09/2015  . ICD (implantable cardioverter-defibrillator) in place 03/26/2015  . Syncope 03/06/2015  . Obesity (BMI 30-39.9) 01/21/2015  . Excessive daytime sleepiness 08/04/2014  . OSA (obstructive sleep apnea) 08/04/2014  . Essential hypertension 06/13/2014  . Chronic systolic CHF (congestive heart failure) ([place]) 05/20/2014  . Congestive heart disease ([place])   . Chronic combined systolic and diastolic CHF  (congestive heart failure) ([place]) 05/03/2014  . CKD (chronic kidney disease), stage III ([place]) 05/03/2014  . Hx of stroke without residual deficits 05/03/2014      Labs reviewed: Basic Metabolic Panel:    Component Value Date/Time   NA 138 11/29/2017 0432   K 3.9 11/29/2017 0432   CL 107 11/29/2017 0432   CO2 22 11/29/2017 0432   GLUCOSE 96 11/29/2017 0432   BUN 21 11/29/2017 0432   CREATININE 1.39 (H) 11/29/2017 0432   CREATININE 1.65 (H) 03/11/2015 1203   CALCIUM 10.0 11/29/2017 0432   PROT 5.8 (L) 11/29/2017 0432   ALBUMIN 3.2 (L) 11/29/2017 0432   AST 17 11/29/2017 0432   ALT 13 11/29/2017 0432   ALKPHOS 88 11/29/2017 0432   BILITOT 0.5 11/29/2017 0432   GFRNONAA 36 (L) 11/29/2017 0432   GFRAA 41 (L) 11/29/2017 0432    Recent Labs    11/03/17 1133  11/23/17 1033  11/25/17 1237  11/27/17 0456 11/28/17 0945 11/29/17 0432  NA 138   < >  --    < >  --    < > 139 137 138  K 3.4*   < >  --    < >  --    < > 3.9 3.9 3.9  CL 100   < >  --    < >  --    < > 106 104 107  CO2 26   < >  --    < >  --    < > 26 21* 22  GLUCOSE 105*   < >  --    < >  --    < > 108* 122* 96  BUN 34*   < >  --    < >  --    < > 25* 21 21  CREATININE 1.80*   < >  --    < > 1.36*   < > 1.54* 1.40* 1.39*  CALCIUM 9.1   < >  --    < >  --    < > 9.9 10.1 10.0  MG 1.0*  --  1.8  --  1.8  --   --   --   --   PHOS  --   --   --   --  3.2  --   --   --   --    < > = values in this interval not displayed.   Liver Function Tests: Recent Labs    11/26/17 0323 11/28/17 0945 11/29/17 0432  AST 15 20 17   ALT 12 12 13   ALKPHOS 86 91 88  BILITOT 0.6 0.5 0.5  PROT 5.9* 6.0* 5.8*  ALBUMIN 3.1* 3.3* 3.2*   No results for input(s): LIPASE, AMYLASE in the last 8760 hours. No results for input(s): AMMONIA in the last 8760 hours. CBC: Recent Labs    05/02/17 0938 05/09/17 0924 11/24/17 1259 11/24/17 1311 11/25/17 1237  WBC 5.1 4.9 4.7  --  5.0  NEUTROABS 3.2 3.4 3.2  --   --   HGB 10.2* 9.9*  11.4* 12.6 13.2  HCT 32.4* 32.8* 38.1 37.0 42.4  MCV 93.1 95.6 86.2  --  85.0  PLT 222 182 234  --  233   Lipid Recent Labs    04/19/17 0424 11/25/17 1237  CHOL 102 138  HDL 48 51  LDLCALC 42 75  TRIG 59 62    Cardiac Enzymes: Recent Labs    03/16/17 1825 04/19/17 0938 11/28/17 0945  CKTOTAL  --  370* 107  TROPONINI <0.03  --   --    BNP: Recent Labs    03/16/17 1825  BNP 9.3   No results found for: [facility] Lab Results  Component Value Date   HGBA1C 5.9 (H) 11/25/2017   Lab Results  Component Value Date   TSH 3.102 04/19/2017   Lab Results  Component Value Date   [id number] 993 04/19/2017   No results found for: FOLATE No results found for: IRON, TIBC, FERRITIN  Imaging and Procedures obtained prior to SNF admission: Ct Head Wo Contrast  Result Date: 11/24/2017 CLINICAL DATA:  Falls and extremity weakness. History of prior cerebral infarction. EXAM: CT HEAD WITHOUT CONTRAST TECHNIQUE: Contiguous axial images were obtained from the base of the skull through the vertex without intravenous contrast. COMPARISON:  Prior CT of the head on 04/18/2017. MRI of the head on 04/19/2017. FINDINGS: Brain: Stable small vessel ischemic disease in the periventricular white matter and old lacunar infarcts of the left internal capsule and bilateral basal ganglia. The brain demonstrates no evidence of hemorrhage, acute infarction, edema, mass effect, extra-axial fluid collection, hydrocephalus or mass lesion. Vascular: No hyperdense vessel or unexpected calcification. Skull: Normal. Negative for fracture or focal lesion. Sinuses/Orbits: No acute finding. Other: None. IMPRESSION: Stable small vessel disease and old lacunar infarcts. Electronically Signed   By: Aletta Edouard M.D.   On: 11/24/2017 13:47   Mr Jodene Nam Head Wo Contrast  Result Date: 11/25/2017 CLINICAL DATA:  Acute left leg weakness. Prior stroke with chronic right sided weakness. EXAM: MRI HEAD WITHOUT CONTRAST MRA  HEAD WITHOUT CONTRAST MRA NECK WITHOUT CONTRAST TECHNIQUE: Multiplanar, multiecho pulse sequences of the brain and surrounding structures were obtained without intravenous contrast. Angiographic images of the Circle of Willis were obtained using MRA technique without intravenous contrast. Angiographic images of the neck were obtained using MRA technique without intravenous contrast. Carotid stenosis measurements (when applicable) are obtained utilizing NASCET criteria, using the distal internal carotid diameter as the denominator. COMPARISON:  Head CT 11/24/2017.  Head MRI/MRA 04/19/2017. FINDINGS: MRI HEAD FINDINGS Brain: There is no evidence of acute infarct, mass, midline shift, or extra-axial fluid collection. Numerous chronic microhemorrhages are seen in the cerebellum, brainstem, and both cerebral hemispheres including deep gray nuclei and may reflect chronic  hypertension. A greater number of microhemorrhages are shown on the current examination compared to the prior MRI which is likely predominantly due to differences in imaging technique. Chronic small infarcts are again seen in the cerebellum, brainstem, deep gray nuclei, and cerebral white matter bilaterally. Patchy to confluent cerebral white matter T2 hyperintensities are nonspecific but compatible with extensive chronic small vessel ischemic disease. Chronic small vessel ischemia has overall progressed from the prior MRI. Wallerian degeneration is noted in the left cerebral peduncle. Mild cerebral atrophy is within normal limits for age. Vascular: Abnormal appearance of the distal left vertebral artery, more fully evaluated below. Other major intracranial vascular flow voids are preserved. Skull and upper cervical spine: No suspicious marrow lesion. Disc degeneration at C4-5 greater than C3-4. Sinuses/Orbits: Bilateral cataract extraction. Paranasal sinuses and mastoid air cells are clear. Other: None. MRA HEAD FINDINGS The intracranial vertebral  arteries are patent with the right being strongly dominant. There is mild to moderate multifocal left V4 stenosis. Patent right PICA, bilateral AICA, and bilateral SCA origins are identified. The basilar artery is mildly small in caliber diffusely without significant focal stenosis. There are large posterior communicating arteries bilaterally with mild hypoplasia of the left P1 segment. Both PCAs are patent without evidence of significant proximal stenosis. The internal carotid arteries are widely patent from skull base to carotid termini. 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 ACAs and MCAs are patent without evidence of flow limiting proximal stenosis. The right A1 segment is again noted to be mildly hypoplastic. There is some asymmetric attenuation of left MCA branch vessels compared to the right. A 2 mm superiorly directed outpouching from the right M1 segment is unchanged and suggestive of a small aneurysm without a vessel seen arising from it to support an alternative diagnosis of infundibulum. MRA NECK FINDINGS Assessment is limited by noncontrast technique and motion artifact. Flow related signal is poor in the proximal arch vessels, likely from technical factors. There is a common origin of the brachiocephalic and left common carotid arteries, a normal variant. Aside from poor assessment of the proximal right greater than left common carotid arteries, aside from poor evaluation proximally, the common carotid arteries appear widely patent. No significant ICA stenosis is identified, with evidence of less than 25% narrowing of the left ICA origin. Antegrade flow is visualized in both vertebral arteries. The right vertebral artery is strongly dominant without evidence of significant stenosis allowing for nondiagnostic assessment of its origin. The left vertebral artery origin and much of the V1 segment are not evaluated. The left V2  segment is patent but diffusely small in caliber with apparent multifocal stenoses which may reflect a combination of atherosclerosis and artifact. IMPRESSION: 1. No acute intracranial abnormality. 2. 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 3. Patent circle of Willis without large vessel occlusion or high-grade proximal intracranial stenosis. 4. 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 5. Unchanged 2 mm right M1 aneurysm. 6. Limited neck MRA due to motion and noncontrast technique. 7. No evidence of significant carotid or right vertebral artery stenosis in the neck. 8. Diminutive left vertebral artery with multifocal stenosis. Electronically Signed   By: Logan Bores M.D.   On: 11/25/2017 12:48   Mr Jodene Nam Neck Wo Contrast  Result Date: 11/25/2017 CLINICAL DATA:  Acute left leg weakness. Prior stroke with chronic right sided weakness. EXAM:  MRI HEAD WITHOUT CONTRAST MRA HEAD WITHOUT CONTRAST MRA NECK WITHOUT CONTRAST TECHNIQUE: Multiplanar, multiecho pulse sequences of the brain and surrounding structures were obtained without intravenous contrast. Angiographic images of the Circle of Willis were obtained using MRA technique without intravenous contrast. Angiographic images of the neck were obtained using MRA technique without intravenous contrast. Carotid stenosis measurements (when applicable) are obtained utilizing NASCET criteria, using the distal internal carotid diameter as the denominator. COMPARISON:  Head CT 11/24/2017.  Head MRI/MRA 04/19/2017. FINDINGS: MRI HEAD FINDINGS Brain: There is no evidence of acute infarct, mass, midline shift, or extra-axial fluid collection. Numerous chronic microhemorrhages are seen in the cerebellum, brainstem, and both cerebral hemispheres including deep gray nuclei and may reflect chronic hypertension. A greater number of microhemorrhages are shown  on the current examination compared to the prior MRI which is likely predominantly due to differences in imaging technique. Chronic small infarcts are again seen in the cerebellum, brainstem, deep gray nuclei, and cerebral white matter bilaterally. Patchy to confluent cerebral white matter T2 hyperintensities are nonspecific but compatible with extensive chronic small vessel ischemic disease. Chronic small vessel ischemia has overall progressed from the prior MRI. Wallerian degeneration is noted in the left cerebral peduncle. Mild cerebral atrophy is within normal limits for age. Vascular: Abnormal appearance of the distal left vertebral artery, more fully evaluated below. Other major intracranial vascular flow voids are preserved. Skull and upper cervical spine: No suspicious marrow lesion. Disc degeneration at C4-5 greater than C3-4. Sinuses/Orbits: Bilateral cataract extraction. Paranasal sinuses and mastoid air cells are clear. Other: None. MRA HEAD FINDINGS The intracranial vertebral arteries are patent with the right being strongly dominant. There is mild to moderate multifocal left V4 stenosis. Patent right PICA, bilateral AICA, and bilateral SCA origins are identified. The basilar artery is mildly small in caliber diffusely without significant focal stenosis. There are large posterior communicating arteries bilaterally with mild hypoplasia of the left P1 segment. Both PCAs are patent without evidence of significant proximal stenosis. The internal carotid arteries are widely patent from skull base to carotid termini. 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 ACAs and MCAs are patent without evidence of flow limiting proximal stenosis. The right A1 segment is again noted to be mildly hypoplastic. There is some asymmetric attenuation of left MCA branch vessels compared to the right. A 2 mm superiorly directed outpouching from the right M1  segment is unchanged and suggestive of a small aneurysm without a vessel seen arising from it to support an alternative diagnosis of infundibulum. MRA NECK FINDINGS Assessment is limited by noncontrast technique and motion artifact. Flow related signal is poor in the proximal arch vessels, likely from technical factors. There is a common origin of the brachiocephalic and left common carotid arteries, a normal variant. Aside from poor assessment of the proximal right greater than left common carotid arteries, aside from poor evaluation proximally, the common carotid arteries appear widely patent. No significant ICA stenosis is identified, with evidence of less than 25% narrowing of the left ICA origin. Antegrade flow is visualized in both vertebral arteries. The right vertebral artery is strongly dominant without evidence of significant stenosis allowing for nondiagnostic assessment of its origin. The left vertebral artery origin and much of the V1 segment are not evaluated. The left V2 segment is patent but diffusely small in caliber with apparent multifocal stenoses which may reflect a combination of atherosclerosis and artifact. IMPRESSION: 1. No acute intracranial  abnormality. 2. 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 3. Patent circle of Willis without large vessel occlusion or high-grade proximal intracranial stenosis. 4. 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 5. Unchanged 2 mm right M1 aneurysm. 6. Limited neck MRA due to motion and noncontrast technique. 7. No evidence of significant carotid or right vertebral artery stenosis in the neck. 8. Diminutive left vertebral artery with multifocal stenosis. Electronically Signed   By: Logan Bores M.D.   On: 11/25/2017 12:48   Mr Brain Wo Contrast  Result Date: 11/25/2017 CLINICAL DATA:  Acute left leg weakness. Prior stroke with chronic  right sided weakness. EXAM: MRI HEAD WITHOUT CONTRAST MRA HEAD WITHOUT CONTRAST MRA NECK WITHOUT CONTRAST TECHNIQUE: Multiplanar, multiecho pulse sequences of the brain and surrounding structures were obtained without intravenous contrast. Angiographic images of the Circle of Willis were obtained using MRA technique without intravenous contrast. Angiographic images of the neck were obtained using MRA technique without intravenous contrast. Carotid stenosis measurements (when applicable) are obtained utilizing NASCET criteria, using the distal internal carotid diameter as the denominator. COMPARISON:  Head CT 11/24/2017.  Head MRI/MRA 04/19/2017. FINDINGS: MRI HEAD FINDINGS Brain: There is no evidence of acute infarct, mass, midline shift, or extra-axial fluid collection. Numerous chronic microhemorrhages are seen in the cerebellum, brainstem, and both cerebral hemispheres including deep gray nuclei and may reflect chronic hypertension. A greater number of microhemorrhages are shown on the current examination compared to the prior MRI which is likely predominantly due to differences in imaging technique. Chronic small infarcts are again seen in the cerebellum, brainstem, deep gray nuclei, and cerebral white matter bilaterally. Patchy to confluent cerebral white matter T2 hyperintensities are nonspecific but compatible with extensive chronic small vessel ischemic disease. Chronic small vessel ischemia has overall progressed from the prior MRI. Wallerian degeneration is noted in the left cerebral peduncle. Mild cerebral atrophy is within normal limits for age. Vascular: Abnormal appearance of the distal left vertebral artery, more fully evaluated below. Other major intracranial vascular flow voids are preserved. Skull and upper cervical spine: No suspicious marrow lesion. Disc degeneration at C4-5 greater than C3-4. Sinuses/Orbits: Bilateral cataract extraction. Paranasal sinuses and mastoid air cells are clear. Other:  None. MRA HEAD FINDINGS The intracranial vertebral arteries are patent with the right being strongly dominant. There is mild to moderate multifocal left V4 stenosis. Patent right PICA, bilateral AICA, and bilateral SCA origins are identified. The basilar artery is mildly small in caliber diffusely without significant focal stenosis. There are large posterior communicating arteries bilaterally with mild hypoplasia of the left P1 segment. Both PCAs are patent without evidence of significant proximal stenosis. The internal carotid arteries are widely patent from skull base to carotid termini. 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 ACAs and MCAs are patent without evidence of flow limiting proximal stenosis. The right A1 segment is again noted to be mildly hypoplastic. There is some asymmetric attenuation of left MCA branch vessels compared to the right. A 2 mm superiorly directed outpouching from the right M1 segment is unchanged and suggestive of a small aneurysm without a vessel seen arising from it to support an alternative diagnosis of infundibulum. MRA NECK FINDINGS Assessment is limited by noncontrast technique and motion artifact. Flow related signal is poor in the proximal arch vessels, likely from technical factors. There is a common origin of the  brachiocephalic and left common carotid arteries, a normal variant. Aside from poor assessment of the proximal right greater than left common carotid arteries, aside from poor evaluation proximally, the common carotid arteries appear widely patent. No significant ICA stenosis is identified, with evidence of less than 25% narrowing of the left ICA origin. Antegrade flow is visualized in both vertebral arteries. The right vertebral artery is strongly dominant without evidence of significant stenosis allowing for nondiagnostic assessment of its origin. The left vertebral artery origin and much  of the V1 segment are not evaluated. The left V2 segment is patent but diffusely small in caliber with apparent multifocal stenoses which may reflect a combination of atherosclerosis and artifact. IMPRESSION: 1. No acute intracranial abnormality. 2. 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 3. Patent circle of Willis without large vessel occlusion or high-grade proximal intracranial stenosis. 4. 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 5. Unchanged 2 mm right M1 aneurysm. 6. Limited neck MRA due to motion and noncontrast technique. 7. No evidence of significant carotid or right vertebral artery stenosis in the neck. 8. Diminutive left vertebral artery with multifocal stenosis. Electronically Signed   By: Logan Bores M.D.   On: 11/25/2017 12:48   Dg Hip Unilat With Pelvis 2-3 Views Left  Result Date: 11/24/2017 CLINICAL DATA:  [place] today.  Left-sided hip pain. EXAM: DG HIP (WITH OR WITHOUT PELVIS) 2-3V LEFT COMPARISON:  None. FINDINGS: No evidence of acute fracture. There are chronic degenerative or post traumatic findings of the symphysis pubis region. No evidence of hip or proximal femur fracture. IMPRESSION: No acute finding. Electronically Signed   By: Nelson Chimes M.D.   On: 11/24/2017 16:05   Dg Femur 1v Left  Result Date: 11/24/2017 CLINICAL DATA:  [place].  Left femur pain. EXAM: LEFT FEMUR 1 VIEW COMPARISON:  Earlier same day FINDINGS: No evidence of fracture.  Chronic osteoarthritis of the knee. IMPRESSION: Negative. Electronically Signed   By: Nelson Chimes M.D.   On: 11/24/2017 16:06     Not all labs, radiology exams or other studies done during hospitalization come through on my EPIC note; however they are reviewed by me.    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 twice daily and Lasix 40 mg daily  Hypertension SNF- continue Coreg 25 mg twice daily, Lasix 40 mg daily, and BiDil 20-30 7.52 p.o. twice daily  Hyperlipidemia SNF-not stated as uncontrolled; continue high-dose therapy at 80 mg daily  Coronary artery disease SNF stable, no complaints of chest pain or equivalent; continue Coreg 25 mg twice daily, Plavix 75 mg daily BiDil 20- 37.52 p.o. twice daily and Lipitor   Time spent greater than 45 minutes;> 50% of time with patient was spent reviewing records, labs, tests and studies, counseling and developing plan of care  Webb Silversmith D. Sheppard Coil, MD

## 2017-12-04 ENCOUNTER — Encounter: Payer: Self-pay | Admitting: Internal Medicine

## 2017-12-04 DIAGNOSIS — I2510 Atherosclerotic heart disease of native coronary artery without angina pectoris: Secondary | ICD-10-CM | POA: Insufficient documentation

## 2017-12-06 LAB — BASIC METABOLIC PANEL
BUN: 34 — AB (ref 4–21)
Creatinine: 1.5 — AB (ref 0.5–1.1)
Glucose: 90
Potassium: 4.7 (ref 3.4–5.3)
SODIUM: 137 (ref 137–147)

## 2017-12-06 LAB — CBC AND DIFFERENTIAL
HEMATOCRIT: 33 — AB (ref 36–46)
HEMOGLOBIN: 11.1 — AB (ref 12.0–16.0)
Platelets: 211 (ref 150–399)
WBC: 4.7

## 2017-12-07 ENCOUNTER — Other Ambulatory Visit: Payer: Self-pay | Admitting: Internal Medicine

## 2017-12-07 ENCOUNTER — Telehealth (HOSPITAL_COMMUNITY): Payer: Self-pay | Admitting: Surgery

## 2017-12-07 NOTE — Telephone Encounter (Signed)
Patient will be discharged from [facility] secondary to currently being in SNF.  She is aware to call [facility] with any worsening signs or symptoms.

## 2017-12-09 ENCOUNTER — Other Ambulatory Visit: Payer: Self-pay | Admitting: *Deleted

## 2017-12-09 NOTE — Patient Outreach (Signed)
[facility]) Care Management  12/09/2017  Tara Dunn 07/12/1940 [id number]   Met with Tara Dunn SW/Discharge planner for [facility] to discuss patient's needs.  Tara Dunn states patient's current d/c plan is to return home with her daughter where she previously resided. Tara Dunn states patient would like to go to an Assisted Living facility but daughter recently had surgery and has been unable to accommodate this plan.  Tara Dunn states the current discharge date planned is at the end of next week.   Met with patient at the bedside. Patient sitting in recliner. Patient states her main health concerns are her b/p and heart failure.  She states she really wants to go to an ALF because she feels like a burden on her daughter.  Patient states her daughter just had surgery and may not be able to be her transportation to MD appointments.  Explained [facility] services and patient accepted services.  Patient gives verbal permission to contact her daughter Tara Dunn at [phone].  Patient plan's to d/c from [facility] at the end of next week.   Plan to collaborate with [facility] UM Team.  Referral sent for Van to engage patient in transition of care program and for [facility] LCSW.   Rutherford Limerick RN, BSN [place] [facility] Acute Care Coordinator [phone]) Business Mobile [phone]) Toll free office

## 2017-12-12 ENCOUNTER — Other Ambulatory Visit: Payer: Self-pay | Admitting: *Deleted

## 2017-12-12 ENCOUNTER — Encounter: Payer: Self-pay | Admitting: *Deleted

## 2017-12-12 NOTE — Patient Outreach (Signed)
[facility]) Care Management  12/12/2017  Sruthi Mcmahen 03-Apr-1940 [id number]  CSW was able to make initial contact with patient today to perform the assessment, as well as assess and assist with social work needs and services, when Sharon met with patient at [facility] [place] where patient currently resides to receive short-term rehabilitative services.  CSW introduced self, explained role and types of services provided through [facility] ([place] [facility]).  CSW further explained to patient that CSW works with Merlene Morse Minor, also with [facility]. CSW then explained the reason for the visit, indicating that Mrs. Minor thought that patient would benefit from social work services and resources to assist with long-term care placement arrangements in an assisted living facility of choice.  CSW obtained two HIPAA compliant identifiers from patient, which included patient's name and date of birth. Patient reported that she plans to return to her daughter, Gerda Diss home temporarily, upon release from [facility], until [name] and patient's other daughter, [name] are able to initiate long-term care placement for patient in an assisted living facility.  Patient indicated that she may be going home later this week, but that her discharge planning arrangements have not yet been finalized.  CSW inquired as to whether or not patient would be willing to go directly from [facility] to an assisted living facility of choice, as opposed to going back to Mrs. [facility] [place] home temporarily, and then being placed.  Patient admitted that she is willing to do whatever is most convenient for her daughters, as she does not wish to be a burden to either of them.  Patient then encouraged CSW to speak directly with [name], as she would know more about patient's discharge plan of care. CSW was  able to make contact with [name] today to discuss discharge planning arrangements for patient.  CSW explained to [name] that it would be more beneficial for patient to be discharged from [facility] and then go straight to the assisted living facility, as opposed to patient first going home, and then trying to place patient into an assisted living facility from home.  [name] voiced understanding, admitting that this would also be a better option for her, as she just recently had surgery and is unable to provide 24 hour care and supervision to patient.  [name] indicated that she would make contact with her sister, Verdie Drown, who is currently touring assisted living facilities in [place], [place], where she currently resides, wanting patient to come live near her so that she can make arrangements to visit her often.  [name] agreed to follow-up with CSW at her earliest convenience to report findings. In the meantime, CSW agreed to contact the social worker/discharge planning coordinator at [facility] to ensure that they are aware of patient's discharge plans, as well as to enlist their assistance with faxing patient's FL-2 Form to all assisted living facilities of choice.  CSW left a HIPAA compliant message on voicemail and is currently awaiting a return call.  CSW agreed to follow-up with [name] again on Wednesday, December 14, 2017, if a return call is not received from [name] in the meantime.  CSW encouraged [name] to provide Jocelyn with CSW's contact information, in the event that she has questions or needs social work assistance with regards to placement for patient.  [name] has also been encouraged to go ahead and apply  for Long-Term Care Medicaid for patient, in the county in which patient will reside, as patient has already verbalized that she is unable to pay out of pocket for assisted living placement.  [facility] CM Care Plan  Problem One     Most Recent Value  Care Plan Problem One  Patient is in need of long-term care placement into an assisted living facility.  Role Documenting the Problem One  Clinical Social Worker  Care Plan for Problem One  Active  [facility] Long Term Goal   Patient will receive 24 hour care and supervision in an assisted living facility of choice, within the next 45 days.  [facility] Long Term Goal Start Date  12/12/17  Interventions for Problem One Long Term Goal  CSW will assist patient, patient's family and the discharge planning coordinator at [facility] with arranging long-term care placement for patient in an assisted living facility.  [facility] CM Short Term Goal #1   Patient and patient's daughter will tour facilities of interest and report findings to CSW so that CSW can assist the discharge planning coordinator at [facility] with making long-term care placement arrangements for patient, within the next 30 days.  [facility] CM Short Term Goal #1 Start Date  12/12/17  Interventions for Short Term Goal #1  CSW will assist the discharge planning coordinator at [facility] with faxing patient's FL-2 Form to all assisted living facilities of choice.  [facility] CM Short Term Goal #2   Patient and patient's will apply for Long-Term Care Medicaid for patient, in the community in which patient will reside, within the next two weeks.  [facility] CM Short Term Goal #2 Start Date  12/12/17  Interventions for Short Term Goal #2  CSW has encouraged patient and patient's daughter to go and apply for Long-Term Care Medicaid for patient, providing them with a list of information that they will need to take with them when they go to apply.     [name], BSW, MSW, LCSW  Licensed Education officer, [facility] [employer] [facility]  Mailing [place] [address], [place], [place] [address] Physical Address-[address], [place], [place] [address] Toll Free Main # [phone] Fax # [phone] Cell  # [phone]  Office # [phone] [email or web address]

## 2017-12-14 ENCOUNTER — Encounter: Payer: Self-pay | Admitting: Internal Medicine

## 2017-12-14 ENCOUNTER — Other Ambulatory Visit: Payer: Self-pay | Admitting: *Deleted

## 2017-12-14 ENCOUNTER — Non-Acute Institutional Stay (SKILLED_NURSING_FACILITY): Payer: PPO | Admitting: Internal Medicine

## 2017-12-14 DIAGNOSIS — I63312 Cerebral infarction due to thrombosis of left middle cerebral artery: Secondary | ICD-10-CM | POA: Diagnosis not present

## 2017-12-14 DIAGNOSIS — I10 Essential (primary) hypertension: Secondary | ICD-10-CM

## 2017-12-14 DIAGNOSIS — I25118 Atherosclerotic heart disease of native coronary artery with other forms of angina pectoris: Secondary | ICD-10-CM

## 2017-12-14 DIAGNOSIS — I5042 Chronic combined systolic (congestive) and diastolic (congestive) heart failure: Secondary | ICD-10-CM | POA: Diagnosis not present

## 2017-12-14 DIAGNOSIS — G8191 Hemiplegia, unspecified affecting right dominant side: Secondary | ICD-10-CM | POA: Diagnosis not present

## 2017-12-14 DIAGNOSIS — R29898 Other symptoms and signs involving the musculoskeletal system: Secondary | ICD-10-CM

## 2017-12-14 NOTE — Progress Notes (Signed)
Location:  [place] of Service:  SNF [phone]) Provider: Hennie Duos MD   PCP: Fanny Bien, MD Patient Care Team: Fanny Bien, MD as PCP - General (Family Medicine) Sapor[place], Maree Erie, LCSW as [place] Management Tobi Bastos, RN as [place] Management  Extended Emergency Contact Information Primary Emergency Contact: [place] Phone: [phone] Mobile Phone: [phone] Relation: Daughter  Allergies  Allergen Reactions  . Shellfish Allergy Hives    Chief Complaint  Patient presents with  . Discharge Note    HPI:  77 y.o. female with history of recurrent CVA, residual right-sided hemiparesis, essential hypertension, hyperlipidemia, obesity, OSA, CKD stage III, diastolic congestive heart failure who came to the hospital with complaints of left lower extremity weakness.  Patient CTA of head was negative, MRI of brain showed severe chronic small vessel ischemic disease with numerous chronic lacunar infarcts and chronic microhemorrhages no other findings were noted.  Patient was admitted to [facility] from 10/17-[facility].  She was evaluated by PT who recommended skilled nursing facility.  Patient was admitted to skilled nursing facility for OT/PT and is now ready to be discharged to [facility] assisted living.    Past Medical History:  Diagnosis Date  . 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 Lovena Le  . Aortic dilatation ([place])    a. 04/2014 CTA chest w/ incidental finding of distal thoracic Ao enlargement of 3.39 mm - f/u needed 04/2015.  . Arthritis    "all over my body"  . Cancer of left breast ([place]) 2009   s/p L mastectomy and chemo.  . Cardiomyopathy ([place])    a. 04/2014 Echo: EF 25-30%, mod conc LVH, possibl antsept HK, Gr1 DD, mod-sev dil LA.  . CHF (congestive heart failure) ([place])   . CKD (chronic  kidney disease), stage III ([place])   . Depression   . GERD (gastroesophageal reflux disease)   . Gout   . Heart murmur   . History of stomach ulcers   . Hypertension    a. Dx @ age 7;  b. 04/2014 admission for HTN emergency.  . Obesity (BMI 30-39.9) 01/21/2015  . OSA on CPAP   . Stroke [facility])    a. 3 strokes - last in 1998; "memory problems since"  (03/26/2015)    Past Surgical History:  Procedure Laterality Date  . ABDOMINAL HYSTERECTOMY    . BREAST BIOPSY Left 2008  . CATARACT EXTRACTION, BILATERAL Bilateral   . DILATION AND CURETTAGE OF UTERUS    . EP IMPLANTABLE DEVICE N/A 03/26/2015   MDT single chamber ICD, Dr. Lovena Le  . LEFT HEART CATHETERIZATION WITH CORONARY ANGIOGRAM N/A 05/07/2014   Procedure: LEFT HEART CATHETERIZATION WITH CORONARY ANGIOGRAM;  Surgeon: Belva Crome, MD;  Location: [facility] CATH LAB;  Service: Cardiovascular;  Laterality: N/A;  . MASTECTOMY Left 2009     reports that she has quit smoking. Her smoking use included cigarettes. She has a 20.00 pack-year smoking history. She has never used smokeless tobacco. She reports that she does not drink alcohol or use drugs. Social History   Socioeconomic History  . Marital status: Widowed    Spouse name: Not on file  . Number of children: Not on file  . Years of education: Not on file  . Highest education level: Not on file  Occupational History  . Not on file  Social Needs  . Financial  resource strain: Not on file  . Food insecurity:    Worry: Not on file    Inability: Not on file  . Transportation needs:    Medical: Not on file    Non-medical: Not on file  Tobacco Use  . Smoking status: Former Smoker    Packs/day: 0.50    Years: 40.00    Pack years: 20.00    Types: Cigarettes  . Smokeless tobacco: Never Used  . Tobacco comment: Quit in 2009.  Substance and Sexual Activity  . Alcohol use: No    Alcohol/week: 0.0 standard drinks    Comment: "drank occasionally when I was young; last drink was in the late  1990s"  . Drug use: No  . Sexual activity: Never  Lifestyle  . Physical activity:    Days per week: Not on file    Minutes per session: Not on file  . Stress: Not on file  Relationships  . Social connections:    Talks on phone: Not on file    Gets together: Not on file    Attends religious service: Not on file    Active member of club or organization: Not on file    Attends meetings of clubs or organizations: Not on file    Relationship status: Not on file  . Intimate partner violence:    Fear of current or ex partner: Not on file    Emotionally abused: Not on file    Physically abused: Not on file    Forced sexual activity: Not on file  Other Topics Concern  . Not on file  Social History Narrative   Lives alone in an independent living facility.  Education: 10th grade.     Retired from [employer] work.  Moved here from [place] in 2015.    Pertinent  Health Maintenance Due  Topic Date Due  . DEXA SCAN  12/31/2004  . PNA vac Low Risk Adult (1 of 2 - PCV13) 12/31/2004  . INFLUENZA VACCINE  09/08/2017    Medications: Allergies as of 12/14/2017      Reactions   Shellfish Allergy Hives      Medication List        Accurate as of 12/14/17 12:04 PM. Always use your most recent med list.          ALKA-SELTZER ANTACID PO Take by mouth as needed.   atorvastatin 80 MG tablet Commonly known as:  LIPITOR Take 80 mg by mouth daily.   carvedilol 25 MG tablet Commonly known as:  COREG TAKE 1 TABLET(25 MG) BY MOUTH TWICE DAILY   clopidogrel 75 MG tablet Commonly known as:  PLAVIX Take 1 tablet (75 mg total) by mouth daily.   DULoxetine 30 MG capsule Commonly known as:  CYMBALTA Take 30 mg by mouth daily.   furosemide 20 MG tablet Commonly known as:  LASIX Take 2 tablets (40 mg total) by mouth daily. Can take extra 40 mg as needed   isosorbide-hydrALAZINE 20-37.5 MG tablet Commonly known as:  BIDIL Take 2 tablets by mouth 2 (two) times daily.   magnesium oxide 400 MG  tablet Commonly known as:  MAG-OX Take 1 tablet (400 mg total) by mouth 2 (two) times daily.   omeprazole 20 MG capsule Commonly known as:  PRILOSEC Take 20 mg by mouth daily. Can also take two times daily as needed   potassium chloride SA 20 MEQ tablet Commonly known as:  K-DUR,KLOR-CON Take 20 mEq by mouth daily.  Vitals:   12/14/17 1133  BP: 102/65  Pulse: 76  Resp: 18  Temp: (!) 97.4 F (36.3 C)   There is no height or weight on file to calculate BMI.  Physical Exam  GENERAL APPEARANCE: Alert, conversant. No acute distress.  HEENT: Unremarkable. RESPIRATORY: Breathing is even, unlabored. Lung sounds are clear   CARDIOVASCULAR: Heart RRR no murmurs, rubs or gallops. No peripheral edema.  GASTROINTESTINAL: Abdomen is soft, non-tender, not distended w/ normal bowel sounds.  NEUROLOGIC: Cranial nerves 2-12 grossly intact mild left leg weakness, right-sided hemiparesis   Labs reviewed: Basic Metabolic Panel: Recent Labs    11/03/17 1133  11/23/17 1033  11/25/17 1237  11/27/17 0456 11/28/17 0945 11/29/17 0432 12/06/17  NA 138   < >  --    < >  --    < > 139 137 138 137  K 3.4*   < >  --    < >  --    < > 3.9 3.9 3.9 4.7  CL 100   < >  --    < >  --    < > 106 104 107  --   CO2 26   < >  --    < >  --    < > 26 21* 22  --   GLUCOSE 105*   < >  --    < >  --    < > 108* 122* 96  --   BUN 34*   < >  --    < >  --    < > 25* 21 21 34*  CREATININE 1.80*   < >  --    < > 1.36*   < > 1.54* 1.40* 1.39* 1.5*  CALCIUM 9.1   < >  --    < >  --    < > 9.9 10.1 10.0  --   MG 1.0*  --  1.8  --  1.8  --   --   --   --   --   PHOS  --   --   --   --  3.2  --   --   --   --   --    < > = values in this interval not displayed.   No results found for: [facility] Liver Function Tests: Recent Labs    11/26/17 0323 11/28/17 0945 11/29/17 0432  AST 15 20 17   ALT 12 12 13   ALKPHOS 86 91 88  BILITOT 0.6 0.5 0.5  PROT 5.9* 6.0* 5.8*  ALBUMIN 3.1* 3.3* 3.2*   No  results for input(s): LIPASE, AMYLASE in the last 8760 hours. No results for input(s): AMMONIA in the last 8760 hours. CBC: Recent Labs    05/02/17 0938 05/09/17 0924 11/24/17 1259 11/24/17 1311 11/25/17 1237 12/06/17  WBC 5.1 4.9 4.7  --  5.0 4.7  NEUTROABS 3.2 3.4 3.2  --   --   --   HGB 10.2* 9.9* 11.4* 12.6 13.2 11.1*  HCT 32.4* 32.8* 38.1 37.0 42.4 33*  MCV 93.1 95.6 86.2  --  85.0  --   PLT 222 182 234  --  233 211   Lipid Recent Labs    04/19/17 0424 11/25/17 1237  CHOL 102 138  HDL 48 51  LDLCALC 42 75  TRIG 59 62   Cardiac Enzymes: Recent Labs    03/16/17 1825 04/19/17 0938 11/28/17 0945  CKTOTAL  --  370* 38  TROPONINI <0.03  --   --    BNP: Recent Labs    03/16/17 1825  BNP 9.3   CBG: Recent Labs    04/19/17 0222 11/24/17 1430  GLUCAP 74 95    Procedures and Imaging Studies During Stay: Ct Head Wo Contrast  Result Date: 11/24/2017 CLINICAL DATA:  Falls and extremity weakness. History of prior cerebral infarction. EXAM: CT HEAD WITHOUT CONTRAST TECHNIQUE: Contiguous axial images were obtained from the base of the skull through the vertex without intravenous contrast. COMPARISON:  Prior CT of the head on 04/18/2017. MRI of the head on 04/19/2017. FINDINGS: Brain: Stable small vessel ischemic disease in the periventricular white matter and old lacunar infarcts of the left internal capsule and bilateral basal ganglia. The brain demonstrates no evidence of hemorrhage, acute infarction, edema, mass effect, extra-axial fluid collection, hydrocephalus or mass lesion. Vascular: No hyperdense vessel or unexpected calcification. Skull: Normal. Negative for fracture or focal lesion. Sinuses/Orbits: No acute finding. Other: None. IMPRESSION: Stable small vessel disease and old lacunar infarcts. Electronically Signed   By: Aletta Edouard M.D.   On: 11/24/2017 13:47   Mr Jodene Nam Head Wo Contrast  Result Date: 11/25/2017 CLINICAL DATA:  Acute left leg weakness.  Prior stroke with chronic right sided weakness. EXAM: MRI HEAD WITHOUT CONTRAST MRA HEAD WITHOUT CONTRAST MRA NECK WITHOUT CONTRAST TECHNIQUE: Multiplanar, multiecho pulse sequences of the brain and surrounding structures were obtained without intravenous contrast. Angiographic images of the Circle of Willis were obtained using MRA technique without intravenous contrast. Angiographic images of the neck were obtained using MRA technique without intravenous contrast. Carotid stenosis measurements (when applicable) are obtained utilizing NASCET criteria, using the distal internal carotid diameter as the denominator. COMPARISON:  Head CT 11/24/2017.  Head MRI/MRA 04/19/2017. FINDINGS: MRI HEAD FINDINGS Brain: There is no evidence of acute infarct, mass, midline shift, or extra-axial fluid collection. Numerous chronic microhemorrhages are seen in the cerebellum, brainstem, and both cerebral hemispheres including deep gray nuclei and may reflect chronic hypertension. A greater number of microhemorrhages are shown on the current examination compared to the prior MRI which is likely predominantly due to differences in imaging technique. Chronic small infarcts are again seen in the cerebellum, brainstem, deep gray nuclei, and cerebral white matter bilaterally. Patchy to confluent cerebral white matter T2 hyperintensities are nonspecific but compatible with extensive chronic small vessel ischemic disease. Chronic small vessel ischemia has overall progressed from the prior MRI. Wallerian degeneration is noted in the left cerebral peduncle. Mild cerebral atrophy is within normal limits for age. Vascular: Abnormal appearance of the distal left vertebral artery, more fully evaluated below. Other major intracranial vascular flow voids are preserved. Skull and upper cervical spine: No suspicious marrow lesion. Disc degeneration at C4-5 greater than C3-4. Sinuses/Orbits: Bilateral cataract extraction. Paranasal sinuses and mastoid  air cells are clear. Other: None. MRA HEAD FINDINGS The intracranial vertebral arteries are patent with the right being strongly dominant. There is mild to moderate multifocal left V4 stenosis. Patent right PICA, bilateral AICA, and bilateral SCA origins are identified. The basilar artery is mildly small in caliber diffusely without significant focal stenosis. There are large posterior communicating arteries bilaterally with mild hypoplasia of the left P1 segment. Both PCAs are patent without evidence of significant proximal stenosis. The internal carotid arteries are widely patent from skull base to carotid termini. 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 ACAs and MCAs are patent without evidence of  flow limiting proximal stenosis. The right A1 segment is again noted to be mildly hypoplastic. There is some asymmetric attenuation of left MCA branch vessels compared to the right. A 2 mm superiorly directed outpouching from the right M1 segment is unchanged and suggestive of a small aneurysm without a vessel seen arising from it to support an alternative diagnosis of infundibulum. MRA NECK FINDINGS Assessment is limited by noncontrast technique and motion artifact. Flow related signal is poor in the proximal arch vessels, likely from technical factors. There is a common origin of the brachiocephalic and left common carotid arteries, a normal variant. Aside from poor assessment of the proximal right greater than left common carotid arteries, aside from poor evaluation proximally, the common carotid arteries appear widely patent. No significant ICA stenosis is identified, with evidence of less than 25% narrowing of the left ICA origin. Antegrade flow is visualized in both vertebral arteries. The right vertebral artery is strongly dominant without evidence of significant stenosis allowing for nondiagnostic assessment of its origin. The left  vertebral artery origin and much of the V1 segment are not evaluated. The left V2 segment is patent but diffusely small in caliber with apparent multifocal stenoses which may reflect a combination of atherosclerosis and artifact. IMPRESSION: 1. No acute intracranial abnormality. 2. 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 3. Patent circle of Willis without large vessel occlusion or high-grade proximal intracranial stenosis. 4. 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 5. Unchanged 2 mm right M1 aneurysm. 6. Limited neck MRA due to motion and noncontrast technique. 7. No evidence of significant carotid or right vertebral artery stenosis in the neck. 8. Diminutive left vertebral artery with multifocal stenosis. Electronically Signed   By: Logan Bores M.D.   On: 11/25/2017 12:48   Mr Jodene Nam Neck Wo Contrast  Result Date: 11/25/2017 CLINICAL DATA:  Acute left leg weakness. Prior stroke with chronic right sided weakness. EXAM: MRI HEAD WITHOUT CONTRAST MRA HEAD WITHOUT CONTRAST MRA NECK WITHOUT CONTRAST TECHNIQUE: Multiplanar, multiecho pulse sequences of the brain and surrounding structures were obtained without intravenous contrast. Angiographic images of the Circle of Willis were obtained using MRA technique without intravenous contrast. Angiographic images of the neck were obtained using MRA technique without intravenous contrast. Carotid stenosis measurements (when applicable) are obtained utilizing NASCET criteria, using the distal internal carotid diameter as the denominator. COMPARISON:  Head CT 11/24/2017.  Head MRI/MRA 04/19/2017. FINDINGS: MRI HEAD FINDINGS Brain: There is no evidence of acute infarct, mass, midline shift, or extra-axial fluid collection. Numerous chronic microhemorrhages are seen in the cerebellum, brainstem, and both cerebral hemispheres including deep gray nuclei and  may reflect chronic hypertension. A greater number of microhemorrhages are shown on the current examination compared to the prior MRI which is likely predominantly due to differences in imaging technique. Chronic small infarcts are again seen in the cerebellum, brainstem, deep gray nuclei, and cerebral white matter bilaterally. Patchy to confluent cerebral white matter T2 hyperintensities are nonspecific but compatible with extensive chronic small vessel ischemic disease. Chronic small vessel ischemia has overall progressed from the prior MRI. Wallerian degeneration is noted in the left cerebral peduncle. Mild cerebral atrophy is within normal limits for age. Vascular: Abnormal appearance of the distal left vertebral artery, more fully evaluated below. Other major intracranial vascular flow voids are preserved. Skull and upper cervical spine: No suspicious marrow lesion. Disc degeneration at C4-5 greater than C3-4. Sinuses/Orbits: Bilateral  cataract extraction. Paranasal sinuses and mastoid air cells are clear. Other: None. MRA HEAD FINDINGS The intracranial vertebral arteries are patent with the right being strongly dominant. There is mild to moderate multifocal left V4 stenosis. Patent right PICA, bilateral AICA, and bilateral SCA origins are identified. The basilar artery is mildly small in caliber diffusely without significant focal stenosis. There are large posterior communicating arteries bilaterally with mild hypoplasia of the left P1 segment. Both PCAs are patent without evidence of significant proximal stenosis. The internal carotid arteries are widely patent from skull base to carotid termini. 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 ACAs and MCAs are patent without evidence of flow limiting proximal stenosis. The right A1 segment is again noted to be mildly hypoplastic. There is some asymmetric attenuation of left MCA branch vessels  compared to the right. A 2 mm superiorly directed outpouching from the right M1 segment is unchanged and suggestive of a small aneurysm without a vessel seen arising from it to support an alternative diagnosis of infundibulum. MRA NECK FINDINGS Assessment is limited by noncontrast technique and motion artifact. Flow related signal is poor in the proximal arch vessels, likely from technical factors. There is a common origin of the brachiocephalic and left common carotid arteries, a normal variant. Aside from poor assessment of the proximal right greater than left common carotid arteries, aside from poor evaluation proximally, the common carotid arteries appear widely patent. No significant ICA stenosis is identified, with evidence of less than 25% narrowing of the left ICA origin. Antegrade flow is visualized in both vertebral arteries. The right vertebral artery is strongly dominant without evidence of significant stenosis allowing for nondiagnostic assessment of its origin. The left vertebral artery origin and much of the V1 segment are not evaluated. The left V2 segment is patent but diffusely small in caliber with apparent multifocal stenoses which may reflect a combination of atherosclerosis and artifact. IMPRESSION: 1. No acute intracranial abnormality. 2. 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 3. Patent circle of Willis without large vessel occlusion or high-grade proximal intracranial stenosis. 4. 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 5. Unchanged 2 mm right M1 aneurysm. 6. Limited neck MRA due to motion and noncontrast technique. 7. No evidence of significant carotid or right vertebral artery stenosis in the neck. 8. Diminutive left vertebral artery with multifocal stenosis. Electronically Signed   By: Logan Bores M.D.   On: 11/25/2017 12:48   Mr Brain Wo Contrast  Result  Date: 11/25/2017 CLINICAL DATA:  Acute left leg weakness. Prior stroke with chronic right sided weakness. EXAM: MRI HEAD WITHOUT CONTRAST MRA HEAD WITHOUT CONTRAST MRA NECK WITHOUT CONTRAST TECHNIQUE: Multiplanar, multiecho pulse sequences of the brain and surrounding structures were obtained without intravenous contrast. Angiographic images of the Circle of Willis were obtained using MRA technique without intravenous contrast. Angiographic images of the neck were obtained using MRA technique without intravenous contrast. Carotid stenosis measurements (when applicable) are obtained utilizing NASCET criteria, using the distal internal carotid diameter as the denominator. COMPARISON:  Head CT 11/24/2017.  Head MRI/MRA 04/19/2017. FINDINGS: MRI HEAD FINDINGS Brain: There is no evidence of acute infarct, mass, midline shift, or extra-axial fluid collection. Numerous chronic microhemorrhages are seen in the cerebellum, brainstem, and both cerebral hemispheres including deep gray nuclei and may reflect chronic hypertension. A greater number of microhemorrhages are shown on  the current examination compared to the prior MRI which is likely predominantly due to differences in imaging technique. Chronic small infarcts are again seen in the cerebellum, brainstem, deep gray nuclei, and cerebral white matter bilaterally. Patchy to confluent cerebral white matter T2 hyperintensities are nonspecific but compatible with extensive chronic small vessel ischemic disease. Chronic small vessel ischemia has overall progressed from the prior MRI. Wallerian degeneration is noted in the left cerebral peduncle. Mild cerebral atrophy is within normal limits for age. Vascular: Abnormal appearance of the distal left vertebral artery, more fully evaluated below. Other major intracranial vascular flow voids are preserved. Skull and upper cervical spine: No suspicious marrow lesion. Disc degeneration at C4-5 greater than C3-4. Sinuses/Orbits:  Bilateral cataract extraction. Paranasal sinuses and mastoid air cells are clear. Other: None. MRA HEAD FINDINGS The intracranial vertebral arteries are patent with the right being strongly dominant. There is mild to moderate multifocal left V4 stenosis. Patent right PICA, bilateral AICA, and bilateral SCA origins are identified. The basilar artery is mildly small in caliber diffusely without significant focal stenosis. There are large posterior communicating arteries bilaterally with mild hypoplasia of the left P1 segment. Both PCAs are patent without evidence of significant proximal stenosis. The internal carotid arteries are widely patent from skull base to carotid termini. 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 ACAs and MCAs are patent without evidence of flow limiting proximal stenosis. The right A1 segment is again noted to be mildly hypoplastic. There is some asymmetric attenuation of left MCA branch vessels compared to the right. A 2 mm superiorly directed outpouching from the right M1 segment is unchanged and suggestive of a small aneurysm without a vessel seen arising from it to support an alternative diagnosis of infundibulum. MRA NECK FINDINGS Assessment is limited by noncontrast technique and motion artifact. Flow related signal is poor in the proximal arch vessels, likely from technical factors. There is a common origin of the brachiocephalic and left common carotid arteries, a normal variant. Aside from poor assessment of the proximal right greater than left common carotid arteries, aside from poor evaluation proximally, the common carotid arteries appear widely patent. No significant ICA stenosis is identified, with evidence of less than 25% narrowing of the left ICA origin. Antegrade flow is visualized in both vertebral arteries. The right vertebral artery is strongly dominant without evidence of significant stenosis  allowing for nondiagnostic assessment of its origin. The left vertebral artery origin and much of the V1 segment are not evaluated. The left V2 segment is patent but diffusely small in caliber with apparent multifocal stenoses which may reflect a combination of atherosclerosis and artifact. IMPRESSION: 1. No acute intracranial abnormality. 2. 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 3. Patent circle of Willis without large vessel occlusion or high-grade proximal intracranial stenosis. 4. 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 5. Unchanged 2 mm right M1 aneurysm. 6. Limited neck MRA due to motion and noncontrast technique. 7. No evidence of significant carotid or right vertebral artery stenosis in the neck. 8. Diminutive left vertebral artery with multifocal stenosis. Electronically Signed   By: Logan Bores M.D.   On: 11/25/2017 12:48   Dg Hip Unilat With Pelvis 2-3 Views Left  Result Date: 11/24/2017 CLINICAL DATA:  [place] today.  Left-sided hip pain. EXAM: DG HIP (WITH OR WITHOUT PELVIS) 2-3V LEFT COMPARISON:  None.  FINDINGS: No evidence of acute fracture. There are chronic degenerative or post traumatic findings of the symphysis pubis region. No evidence of hip or proximal femur fracture. IMPRESSION: No acute finding. Electronically Signed   By: Nelson Chimes M.D.   On: 11/24/2017 16:05   Dg Femur 1v Left  Result Date: 11/24/2017 CLINICAL DATA:  [place].  Left femur pain. EXAM: LEFT FEMUR 1 VIEW COMPARISON:  Earlier same day FINDINGS: No evidence of fracture.  Chronic osteoarthritis of the knee. IMPRESSION: Negative. Electronically Signed   By: Nelson Chimes M.D.   On: 11/24/2017 16:06    Assessment/Plan:   Left leg weakness  Cerebrovascular accident (CVA) due to thrombosis of left middle cerebral artery (HCC)  Right hemiparesis (HCC)  Chronic combined systolic and diastolic  CHF (congestive heart failure) ([place])  Essential hypertension  Coronary artery disease of native artery of native heart with stable angina pectoris [facility])   Patient is being discharged with the following home health services: OT/PT  Patient is being discharged with the following durable medical equipment: None  Patient has been advised to f/u with their PCP in 1-2 weeks to bring them up to date on their rehab stay.  Social services at facility was responsible for arranging this appointment.  Pt was provided with a 30 day supply of prescriptions for medications and refills must be obtained from their PCP.  For controlled substances, a more limited supply may be provided adequate until PCP appointment only.  Medications have been reconciled  Time spent greater than 30 minutes;> 50% of time with patient was spent reviewing records, labs, tests and studies, counseling and developing plan of care  Inocencio Homes, MD

## 2017-12-14 NOTE — Patient Outreach (Signed)
Tara Dunn) Care Management  12/14/2017  Tara Dunn Aug 04, 1940 [id number]  CSW was able to make contact with patient's daughter, Tara Dunn today to follow-up regarding long-term care placement arrangements for patient.  [name] reported that she and her sister, Tara Dunn have decided that they would like to place patient at  [facility] in [place], [place].  CSW inquired as to whether or not either of them have shared this information with the social worker/discharge planning coordinator at [place], [place] where patient currently resides to receive short-term rehabilitative services.  Unfortunately, neither of them have been in contact with the staff at [facility] or the staff at [facility] [place] facility, to express an interest.  CSW agreed to contact both facilities, [facility] to enlist the assistance of the Veterinary surgeon to fax patient's FL-2 Form to [facility], and [facility] to ensure that they currently have a long-term care female bed available.  Messages were left for both facilities and CSW is currently awaiting a return call.  [name] agreed to contact CSW once she has also been able to make contact with either or both facilities.  Again, CSW will await a return call.  CSW will follow-up with [name] within the next week, if a return call is not received in the meantime.  [facility] CM Care Plan Problem One     Most Recent Value  Care Plan Problem One  Patient is in need of long-term care placement into an assisted living facility.  Role Documenting the Problem One  Clinical Social Worker  Care Plan for Problem One  Active  [facility] Long Term Goal   Patient will receive 24 hour care and supervision in an assisted living facility of choice, within the next 45 days.   [facility] Long Term Goal Start Date  12/12/17  [facility] CM Short Term Goal #1   Patient and patient's daughter will tour facilities of interest and report findings to CSW so that CSW can assist the discharge planning coordinator at [facility] with making long-term care placement arrangements for patient, within the next 30 days.  [facility] CM Short Term Goal #1 Start Date  12/12/17  [facility] CM Short Term Goal #1 Met Date  12/14/17  Interventions for Short Term Goal #1  Patient's daughter has decided that she would like to place patient at [place] [facility] in [place], [place].  [facility] CM Short Term Goal #2   Patient and patient's will apply for Long-Term Care Medicaid for patient, in the community in which patient will reside, within the next two weeks.  [facility] CM Short Term Goal #2 Start Date  12/12/17     [name], BSW, MSW, [place]  Licensed Clinical Social Worker  [place]  Mailing [place] [address], [place], [place] [address] Physical Address-[address], [place], [place] [address] Toll Free Main # [phone] Fax # [phone] Cell # [phone]  Office # [phone] [email or web address]

## 2017-12-20 ENCOUNTER — Other Ambulatory Visit: Payer: Self-pay | Admitting: *Deleted

## 2017-12-20 NOTE — Patient Outreach (Signed)
[facility]) Care Management  12/20/2017  Alvin Chagoya 01-22-1941 [id number]   CSW made an attempt to try and contact patient's daughter, [name] today to follow-up regarding long-term care placement arrangements for patient; however, [name] was not available at the time of CSW's call.  A HIPAA compliant message was left on voicemail for [name] and CSW is currently awaiting a return call.  CSW will make a second outreach attempt within the next 3-4 business days, if a return call is not received from [name] in the meantime. [name], BSW, MSW, LCSW  Licensed Education officer, environmental Health System  Mailing [place] [address] [place], [place] [address] Physical Address-[address], [place], [place] [address] Toll Free Main # [phone] Fax # [phone] Cell # [phone]  Office # [phone] [email or web address]

## 2017-12-22 ENCOUNTER — Other Ambulatory Visit (HOSPITAL_COMMUNITY): Payer: Self-pay

## 2017-12-22 NOTE — Progress Notes (Deleted)
Cardiology Office Note:    Date:  12/22/2017   ID:  Tara Dunn, DOB [birth year], MRN [id number]  PCP:  Fanny Bien, MD  Cardiologist:  Loralie Champagne, MD (CHF Clinic) Electrophysiologist:  Cristopher Peru, MD  Sleep Medicine:  Fransico Him, MD    Referring MD: Fanny Bien, MD   No chief complaint on file. ***  History of Present Illness:    Tara Dunn is a 77 y.o. female with chronic systolic CHF secondary to non-ischemic CM, s/p ICD, recurrent strokes, coronary artery disease, CKD, hypertension, obstructive sleep apnea.  Previous EF was 25-30%.  Her most recent Echo in 04/2017 demonstrated an EF of 45-50% with mild diastolic dysfunction.  She was last seen in the HF clinic in 10/2017.  She is followed by the para-medicine team.  She was admitted in 11/2017 with weakness.  CT and MRI were negative for an acute stroke.  She was DC to SNF.    ***  [name] ***  Prior CV studies:   The following studies were reviewed today:  *** Echo 04/20/17 Mod LVH, EF 45-50, diff HK, gr 1 DD, trivial MR  Carotid US 04/19/17 Bilat ICA 1-39  Cardiac Catheterization 05/07/14 LM patent LAD prox 30-50 LCx prox 30-40, 3 OMs with luminal irregs up to 50 RCA prox 50-70 EF 35   Past Medical History:  Diagnosis Date  . 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 Lovena Le  . Aortic dilatation ([place])    a. 04/2014 CTA chest w/ incidental finding of distal thoracic Ao enlargement of 3.39 mm - f/u needed 04/2015.  . Arthritis    "all over my body"  . Cancer of left breast ([place]) 2009   s/p L mastectomy and chemo.  . Cardiomyopathy ([place])    a. 04/2014 Echo: EF 25-30%, mod conc LVH, possibl antsept HK, Gr1 DD, mod-sev dil LA.  . CHF (congestive heart failure) ([place])   . CKD (chronic kidney disease), stage III ([place])   . Depression   . GERD (gastroesophageal reflux disease)   . Gout   . Heart murmur   . History of stomach ulcers   . Hypertension    a. Dx @ age 69;  b. 04/2014 admission for HTN emergency.  . Obesity (BMI 30-39.9) 01/21/2015  . OSA on CPAP   . Stroke [facility])    a. 3 strokes - last in 1998; "memory problems since"  (03/26/2015)  1. CVA: 3 prior CVAs, last in 1998.  Thought to be related to HTN. 2. HTN: Long-standing, says she has been hypertensive since age 20. Renal artery dopplers (4/16) were normal.  3. CKD: Suspect related to HTN. 4. Breast cancer: 2009, left mastectomy with chemotherapy.  5. Gout 6. Obese 7. TAH 8. CAD: LHC (3/16) with moderate diffuse disease, worst lesion being 50-70% mid RCA.  9. Cardiomyopathy: Echo (3/16) with EF 25-30%, moderate LVH.  Echo (6/16) with EF 35-40%, moderate LVH, diffuse hypokinesis with septal-lateral dyssynchrony.  Cardiac MRI (12/16) with EF 32%, diffuse hypokinesis, no contrast given due to CKD.  - Medtronic ICD 2/17.  - Echo (5/18): EF 40%, diffuse hypokinesis, moderate LVH, normal RV size and systolic function.  - ECHO 04/20/2017- EF 45-50% Grade IDD 10. OSA: Using CPAP.  11. Event monitor (1/17) with NSR, occasional PVCs.   Surgical Hx: The patient  has a past surgical history that includes left heart catheterization with coronary angiogram (N/A, 05/07/2014); Cardiac catheterization (N/A, 03/26/2015); Breast biopsy (Left, 2008); Mastectomy (Left,  2009); Dilation and curettage of uterus; Abdominal hysterectomy; and Cataract extraction, bilateral (Bilateral).   Current Medications: No outpatient medications have been marked as taking for the 12/23/17 encounter (Appointment) with Tara Dunn T, PA-C.     Allergies:   Shellfish allergy   Social History   Tobacco Use  . Smoking status: Former Smoker    Packs/day: 0.50    Years: 40.00    Pack years: 20.00    Types: Cigarettes  . Smokeless tobacco: Never Used  . Tobacco comment: Quit in 2009.  Substance Use Topics  . Alcohol use: No    Alcohol/week: 0.0 standard drinks    Comment: "drank occasionally when I was young;  last drink was in the late 1990s"  . Drug use: No     Family Hx: The patient's family history includes Cancer in her daughter and sister; Heart attack in her father; Heart disease in her daughter; High blood pressure in her father, mother, and sister.  ROS:   Please see the history of present illness.    ROS All other systems reviewed and are negative.   EKGs/Labs/Other Test Reviewed:    EKG:  EKG is *** ordered today.  The ekg ordered today demonstrates ***  Recent Labs: 03/16/2017: B Natriuretic Peptide 9.3 04/19/2017: TSH 3.102 11/25/2017: Magnesium 1.8 11/29/2017: ALT 13 12/06/2017: BUN 34; Creatinine 1.5; Hemoglobin 11.1; Platelets 211; Potassium 4.7; Sodium 137   Recent Lipid Panel Lab Results  Component Value Date/Time   CHOL 138 11/25/2017 12:37 PM   TRIG 62 11/25/2017 12:37 PM   HDL 51 11/25/2017 12:37 PM   CHOLHDL 2.7 11/25/2017 12:37 PM   LDLCALC 75 11/25/2017 12:37 PM    Physical Exam:    VS:  There were no vitals taken for this visit.    Wt Readings from Last 3 Encounters:  12/01/17 194 lb (88 kg)  11/03/17 194 lb 12.8 oz (88.4 kg)  10/24/17 194 lb 6.4 oz (88.2 kg)     ***Physical Exam  ASSESSMENT & PLAN:    Chronic combined systolic and diastolic CHF (congestive heart failure) (HCC)  Coronary artery disease involving native coronary artery of native heart without angina pectoris  Essential hypertension  CKD (chronic kidney disease), stage III (HCC)  History of stroke***  Dispo:  No follow-ups on file.   Medication Adjustments/Labs and Tests Ordered: Current medicines are reviewed at length with the patient today.  Concerns regarding medicines are outlined above.  Tests Ordered: No orders of the defined types were placed in this encounter.  Medication Changes: No orders of the defined types were placed in this encounter.   Signed, Tara Dopp, PA-C  12/22/2017 3:20 PM    [facility] [address],  [place], [place]  [address] Phone: [phone]; Fax: [phone]

## 2017-12-22 NOTE — Progress Notes (Signed)
CHP visit to [facility] to confirm her transfer to another SNF or returning home.  Staff stated that pt was discharged home on 11/7.  No family contact to [facility] as of today.

## 2017-12-23 ENCOUNTER — Ambulatory Visit: Payer: PPO | Admitting: Physician Assistant

## 2017-12-23 ENCOUNTER — Telehealth (HOSPITAL_COMMUNITY): Payer: Self-pay | Admitting: *Deleted

## 2017-12-23 NOTE — Telephone Encounter (Signed)
-----   Message from Larey Dresser, MD sent at 12/22/2017 10:12 PM EST ----- Regarding: RE: Heart Failure patient on my schedule for 11/15 She should be seen in HF clinic where she is regularly followed. Can probably work in on APP scheduled.  ----- Message ----- From: Sharmon Revere Sent: 12/22/2017   3:06 PM EST To: Larey Dresser, MD, Georgiana Shore, NP, # Subject: Heart Failure patient on my schedule for 11/#  This patient is on my schedule for tomorrow (11/15).  She is followed in the heart failure clinic.  It looks like it was set up at [facility] when she was admitted last month.    Does she need to see me at [facility] or does she need to see Dr. Linus Mako, NP at the HF Clinic?  Please correct appointment if she is supposed to be seen at [facility].  Thanks [employer]

## 2017-12-23 NOTE — Telephone Encounter (Signed)
Left VM for pt to call back to schedule appt with APP next week per Dr.McLean.

## 2017-12-26 ENCOUNTER — Other Ambulatory Visit: Payer: Self-pay | Admitting: *Deleted

## 2017-12-26 ENCOUNTER — Encounter: Payer: Self-pay | Admitting: *Deleted

## 2017-12-26 NOTE — Patient Outreach (Signed)
[facility]) Care Management  12/26/2017  Tara Dunn October 16, 1940 [id number]   CSW made a second attempt to try and contact patient's daughter, Tara Dunn today to follow-up regarding long-term care placement arrangements for patient in an assisted living facility; however, [name] was not available at the time of CSW's call.  A HIPAA compliant message was left for [name] on voicemail.  CSW continues to await a return call.  CSW will make a third and final outreach attempt within the next 3-4 business days, if a return call is not received from [name] in the meantime.  CSW will then proceed with case closure if a return call is not received from [name] with a total of 10 business days, as required number of phone attempts will have been made and outreach letter mailed.  [name], BSW, MSW, LCSW  Licensed Education officer, environmental Health System  Mailing [name] [place], [place] [address] Physical Address-[address], [place], [place] [address] Toll Free Main # [phone] Fax # [phone] Cell # [phone]  Office # [phone] [name]

## 2017-12-30 ENCOUNTER — Other Ambulatory Visit: Payer: Self-pay | Admitting: *Deleted

## 2017-12-30 NOTE — Patient Outreach (Signed)
[facility]) Care Management  12/30/2017  Tara Dunn 1940-08-22 [id number]   Initial outreach post SNF discharge however unsuccessful with outreach call today. RN able to leave a HIPAA approved voice message requesting a call back. Will further engage at this time. Will reschedule and continue outreach attempts.   [name], RN Care Management Coordinator [place] Office [phone]

## 2017-12-30 NOTE — Patient Outreach (Signed)
[facility]) Care Management  12/30/2017  Patches Dino [birth year] [id number]    CSW made a third and final attempt to try and contact patient's daughter, [name] today to follow-up regarding long-term care placement arrangements for patient in an assisted living facility, without success.  A HIPAA compliant message was left for [name] on voicemail.  CSW is currently awaiting a return call.  CSW will proceed with case closure in two business days, if a return call is not received in the meantime, as required number of phone attempts have been made and an outreach letter was mailed to patient's home allowing 10 business days for a response. [name], BSW, MSW, LCSW  Licensed Education officer, environmental Health System  Mailing [place] [address], [place], [place] [address] Physical Address-[address], [place], [place] [address] Toll Free Main # [phone] Fax # [phone] Cell # [phone]  Office # [phone] [email or web address]

## 2018-01-04 ENCOUNTER — Encounter: Payer: Self-pay | Admitting: *Deleted

## 2018-01-04 ENCOUNTER — Other Ambulatory Visit: Payer: Self-pay | Admitting: *Deleted

## 2018-01-04 ENCOUNTER — Inpatient Hospital Stay (HOSPITAL_COMMUNITY)
Admission: RE | Admit: 2018-01-04 | Discharge: 2018-01-04 | Disposition: A | Discharge status: Home / Self Care | Payer: PPO | Source: Ambulatory Visit

## 2018-01-04 NOTE — Patient Outreach (Signed)
[facility]) Care Management  01/04/2018  Tara Dunn Feb 20, 1940 [id number]   CSW will perform a case closure on patient, due to inability to maintain phone contact with patient, despite required number of phone attempts made and outreach letter mailed to patient's home allowing 10 business days for a response if patient is interested in continuing to receive social work services through [place] with [facility].  CSW will notify patient's RNCM with [name], [name] of CSW's plans to close patient's case.  CSW will fax an update to patient's Primary Care Physician, [name] to ensure that they are aware of CSW's involvement with patient's plan of care.   [name], BSW, MSW, LCSW  Licensed Education officer, environmental Health System  Mailing [place] [address] [place], [place] [address] Physical Address-[address], [place], [place] [address] Toll Free Main # [phone] Fax # [phone] Cell # [phone]  Office # [phone] [email or web address]

## 2018-01-04 NOTE — Patient Outreach (Signed)
[facility]) Care Management  01/04/2018  Tara Dunn Nov 25, 1940 [id number]    RN attempted another outreach call today however remains unsuccessful and RN unable to leave a HIPAA approved voice message. Will reschedule another outreach call next week. Note other team unable to reach pt also.  [name], RN Care Management Coordinator [place] Office [phone]

## 2018-01-09 ENCOUNTER — Other Ambulatory Visit: Payer: Self-pay | Admitting: *Deleted

## 2018-01-09 NOTE — Patient Outreach (Signed)
[name]) Care Management  01/09/2018  Tara Dunn [birth year] [id number]    RN 3rd unsuccessful outreach attempt to reach pt. RN able to leave a HIPAA approved voice message requesting a call back. Will continue to allow pt time to respond prior to case closure from this discipline.   [name], RN Care Management Coordinator [place] Office [phone]

## 2018-01-13 ENCOUNTER — Other Ambulatory Visit: Payer: Self-pay | Admitting: Internal Medicine

## 2018-01-13 ENCOUNTER — Other Ambulatory Visit: Payer: Self-pay | Admitting: *Deleted

## 2018-01-13 NOTE — Patient Outreach (Signed)
[facility]) Care Management  01/13/2018  Tara Dunn [birth year] [id number]  Case closure due to unsuccessful contacts (note other team member also unsuccessful with outreach calls). Will verify or notify primary call provider of pt's status with [facility] services. Note no response to outreach letters at this time.  [name], RN Care Management Coordinator [place] Office [phone]

## 2018-01-14 ENCOUNTER — Other Ambulatory Visit (HOSPITAL_COMMUNITY): Payer: Self-pay | Admitting: Adult Health

## 2018-02-02 ENCOUNTER — Encounter (HOSPITAL_COMMUNITY): Payer: PPO

## 2018-02-13 ENCOUNTER — Inpatient Hospital Stay (HOSPITAL_COMMUNITY)
Admission: RE | Admit: 2018-02-13 | Discharge: 2018-02-13 | Disposition: A | Discharge status: Home / Self Care | Payer: PPO | Source: Ambulatory Visit

## 2018-03-16 IMAGING — MR MR LUMBAR SPINE W/O CM
4 of 5 series · 18 of 48 positions shown · non-contrast
Comparison: None.

CLINICAL DATA: approximately 1 month ago daughter noticed her
mother requires more assistance moving her right leg while getting
into the car. [REDACTED], daughter reports even worsening right
leg weakness with patient being unable to get up from her bed
without any support.

EXAM:
MRI LUMBAR SPINE WITHOUT CONTRAST
TECHNIQUE: Multiplanar, multisequence MR imaging of the lumbar spine was
performed. No intravenous contrast was administered.

[Series 16: T2 · sagittal · 4.0mm · 0.55mm/px · 5 of 13 slices shown (1 of 2)]
[im 1/13]
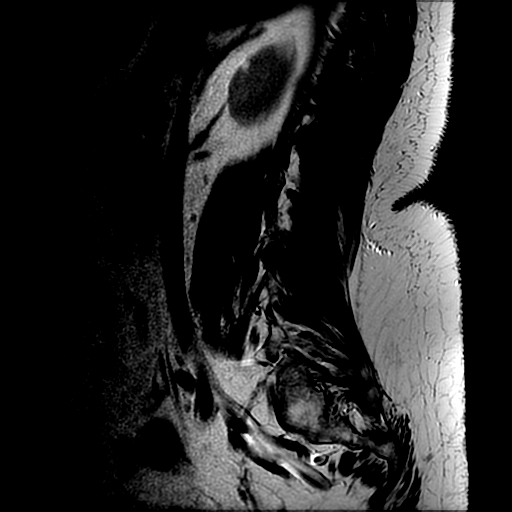
[im 4/13]
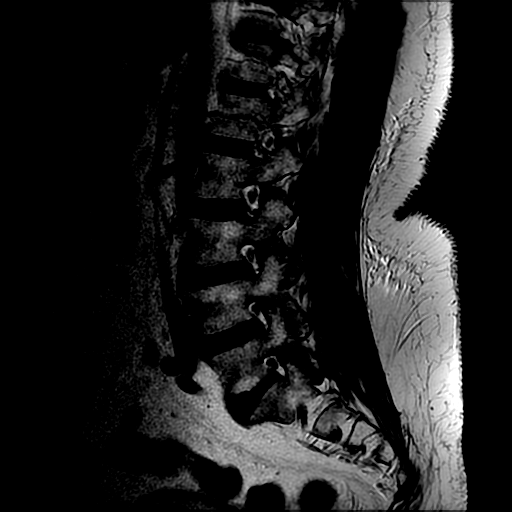
[im 7/13]
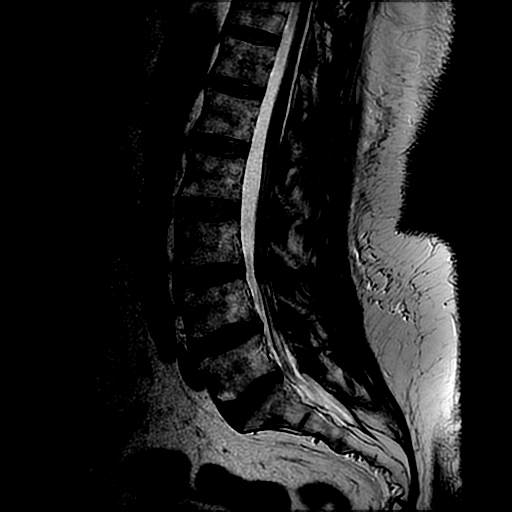
[im 10/13]
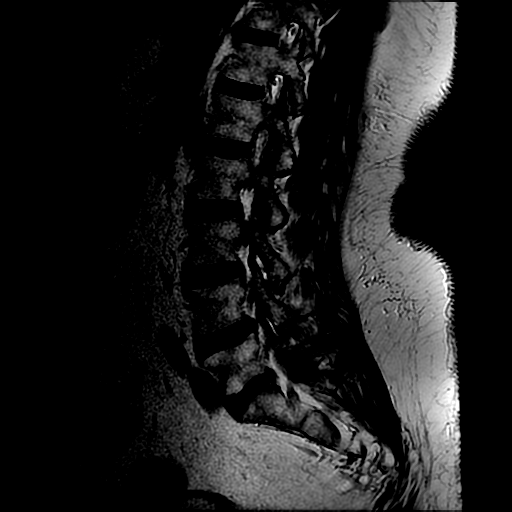
[im 13/13]
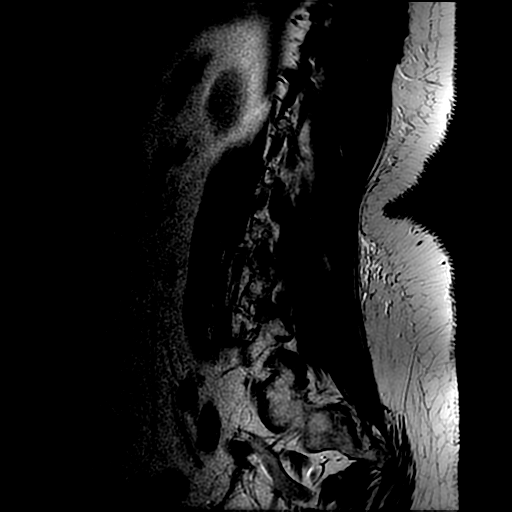

[Series 17: T1 · sagittal · 4.0mm · 0.55mm/px · 3 of 13 slices shown (1 of 2)]
[im 1/13]
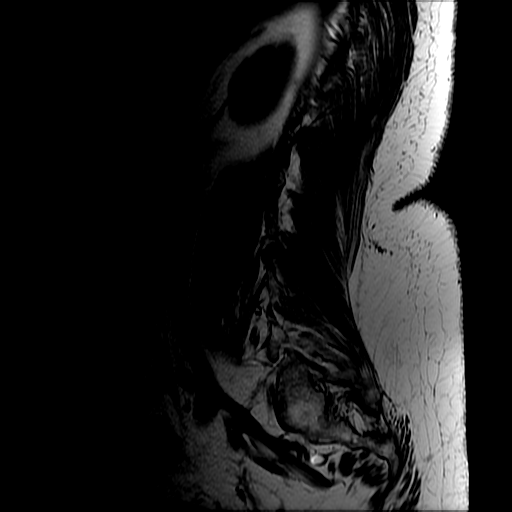
[im 7/13]
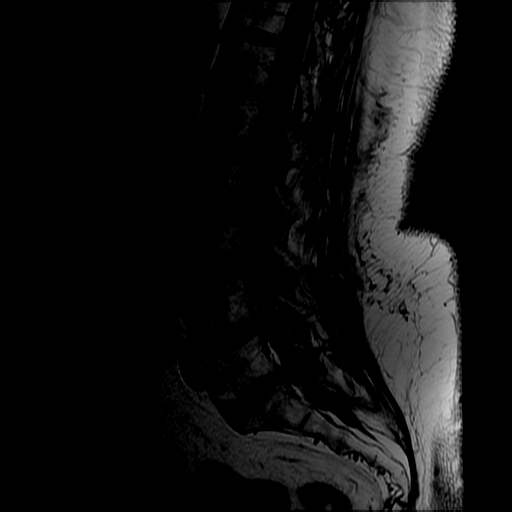
[im 13/13]
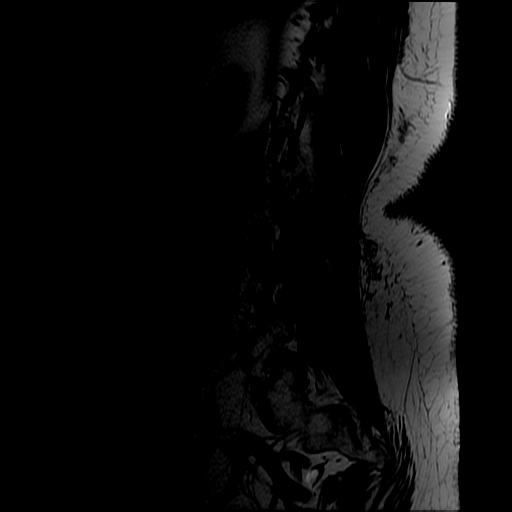

[Series 19: T2 · axial · 4.0mm · 0.39mm/px · z∈[-632,-467]mm · 7 of 36 slices shown (2 of 2)]
[im 3/36]
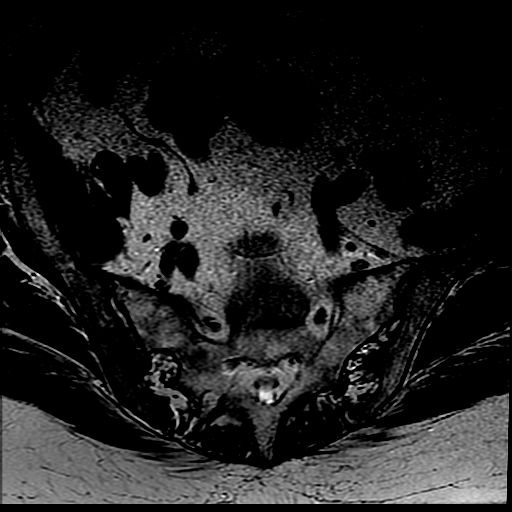
[im 5/36]
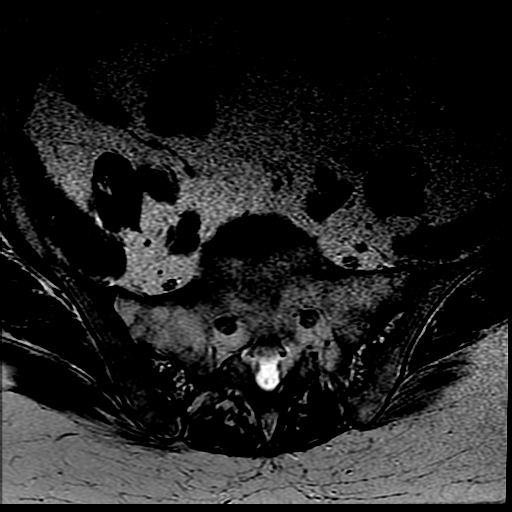
[im 8/36]
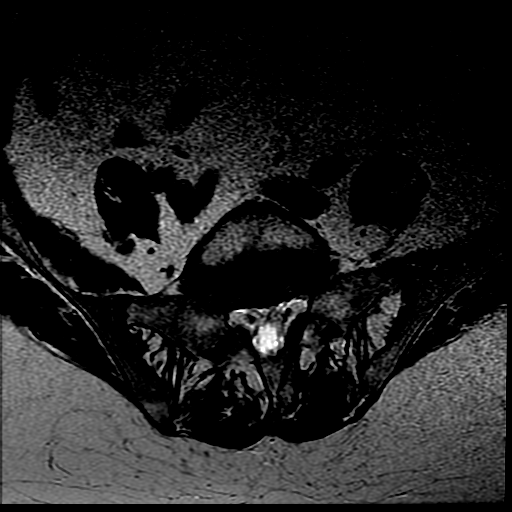
[im 12/36]
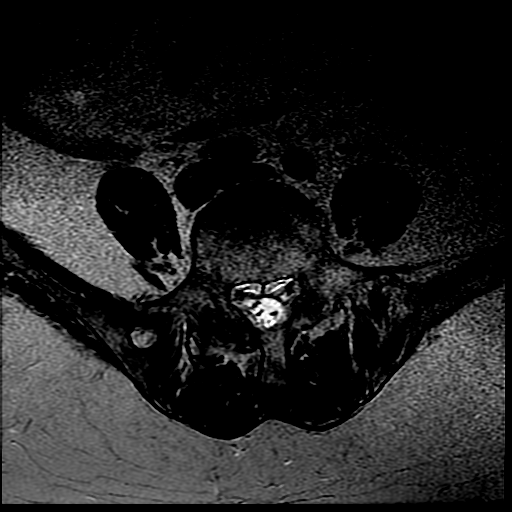
[im 17/36]
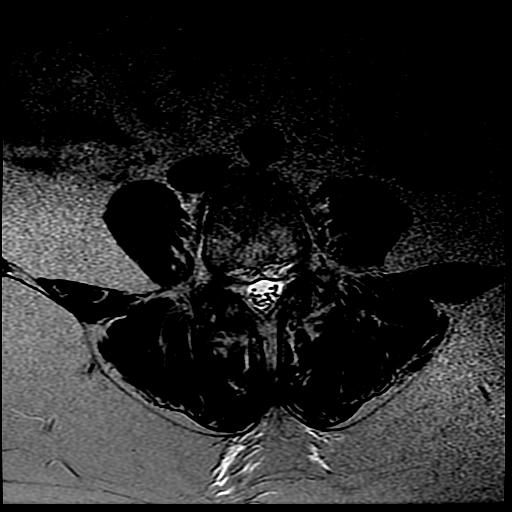
[im 19/36]
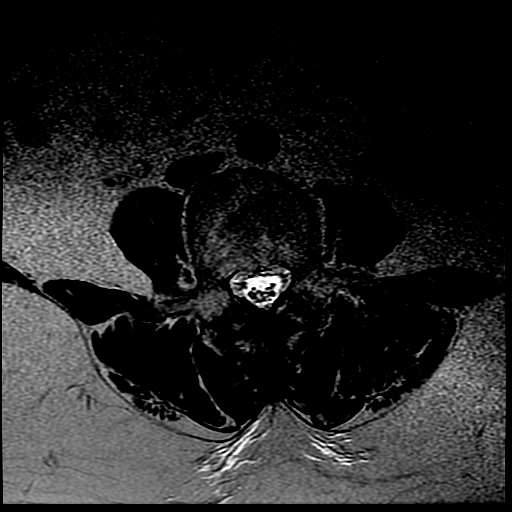
[im 31/36]
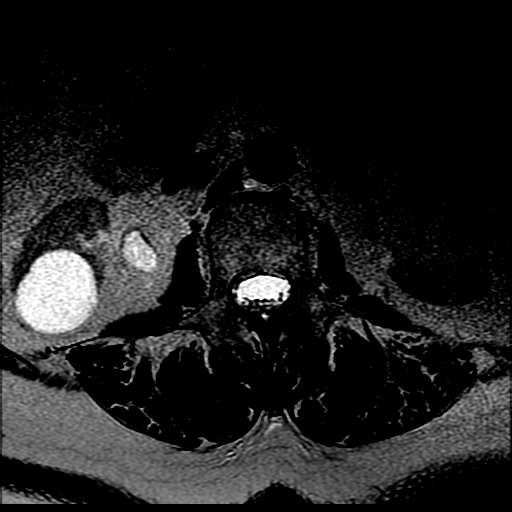

[Series 20: T1 · axial · 4.0mm · 0.39mm/px · z∈[-622,-467]mm · 3 of 36 slices shown (2 of 2)]
[im 5/36]
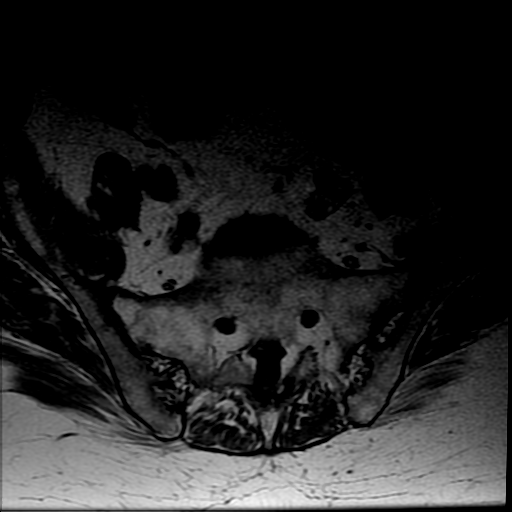
[im 19/36]
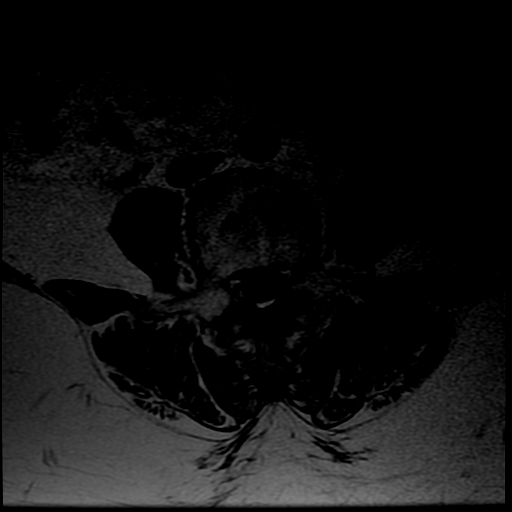
[im 31/36]
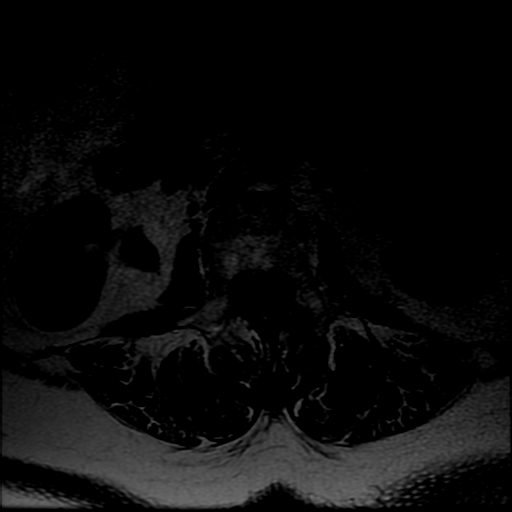

[18 of 48 positions shown; findings below may reference images not displayed]

FINDINGS: Segmentation:  Standard.

Alignment:  Physiologic.

Vertebrae:  No fracture, evidence of discitis, or bone lesion.

Conus medullaris and cauda equina: Conus extends to the L1 level.
Conus and cauda equina appear normal.

Paraspinal and other soft tissues: No acute paraspinal abnormality.
Right renal cyst.

Disc levels:

Disc spaces: Disc desiccation at L3-4, L4-5 and L5-S1.

T12-L1: No significant disc bulge. No evidence of neural foraminal
stenosis. No central canal stenosis.

L1-L2: No significant disc bulge. No evidence of neural foraminal
stenosis. No central canal stenosis.

L2-L3: No significant disc bulge. No evidence of neural foraminal
stenosis. No central canal stenosis.

L3-L4: Mild broad-based disc bulge eccentric towards the right.
Moderate bilateral facet arthropathy. Mild spinal stenosis. No
evidence of neural foraminal stenosis.

L4-L5: Broad-based disc bulge with an annular fissure. Moderate
bilateral facet arthropathy with ligamentum flavum infolding.
Bilateral lateral recess stenosis. No evidence of neural foraminal
stenosis. No central canal stenosis.

L5-S1: Broad-based disc bulge. No evidence of neural foraminal
stenosis. No central canal stenosis.
IMPRESSION: 1. At L4-5 there is a broad-based disc bulge with an annular
fissure. Moderate bilateral facet arthropathy with ligamentum flavum
infolding. Bilateral lateral recess stenosis.
2. At L3-4 there is a mild broad-based disc bulge eccentric towards
the right. Moderate bilateral facet arthropathy. Mild spinal
stenosis.

## 2018-03-22 ENCOUNTER — Telehealth (HOSPITAL_COMMUNITY): Payer: Self-pay

## 2018-03-22 NOTE — Telephone Encounter (Signed)
Dental precautions form faxed to DMD with DM signature

## 2018-03-27 ENCOUNTER — Encounter: Payer: Self-pay | Admitting: Adult Health

## 2018-04-14 ENCOUNTER — Other Ambulatory Visit: Payer: Self-pay | Admitting: Internal Medicine

## 2018-04-24 ENCOUNTER — Ambulatory Visit: Payer: PPO | Admitting: Adult Health

## 2018-05-03 ENCOUNTER — Other Ambulatory Visit: Payer: Self-pay | Admitting: Internal Medicine

## 2018-05-15 ENCOUNTER — Telehealth: Payer: Self-pay | Admitting: Cardiology

## 2018-05-15 NOTE — Telephone Encounter (Signed)
LMOVM for pt to return call. We received a request to release patient in carelink to [facility]. Is it ok to release patient?

## 2018-05-16 NOTE — Telephone Encounter (Signed)
Spoke w/ Legrand Como from [facility]. He confirmed that pt established with them on 05/12/2018. Patient was released, marked inactive, and all upcoming appts cancelled.

## 2018-05-16 NOTE — Telephone Encounter (Signed)
Attempted to call pt x2 to confirm release to [facility]. No answer and unable to leave a message.  LMOVM for [facility] to call back.

## 2018-07-28 ENCOUNTER — Other Ambulatory Visit (HOSPITAL_COMMUNITY): Payer: Self-pay | Admitting: Student

## 2018-07-28 ENCOUNTER — Other Ambulatory Visit: Payer: Self-pay | Admitting: Cardiology

## 2018-10-09 ENCOUNTER — Other Ambulatory Visit (HOSPITAL_COMMUNITY): Payer: Self-pay | Admitting: Student

## 2018-10-09 ENCOUNTER — Other Ambulatory Visit: Payer: Self-pay | Admitting: Cardiology

## 2019-01-10 ENCOUNTER — Other Ambulatory Visit (HOSPITAL_COMMUNITY): Payer: Self-pay | Admitting: Student

## 2019-01-10 ENCOUNTER — Other Ambulatory Visit: Payer: Self-pay | Admitting: Cardiology

## 2019-02-07 ENCOUNTER — Other Ambulatory Visit: Payer: Self-pay | Admitting: Cardiology

## 2019-06-25 ENCOUNTER — Other Ambulatory Visit (HOSPITAL_COMMUNITY): Payer: Self-pay | Admitting: Student
